# Patient Record
Sex: Male | Born: 1970 | Race: Black or African American | Hispanic: No | Marital: Married | Smoking: Former smoker
Health system: Southern US, Community
[De-identification: ages and names within clinical notes are randomized; demographics above are authoritative.]

## PROBLEM LIST (undated history)

## (undated) DIAGNOSIS — E119 Type 2 diabetes mellitus without complications: Secondary | ICD-10-CM

## (undated) DIAGNOSIS — T7840XA Allergy, unspecified, initial encounter: Secondary | ICD-10-CM

## (undated) DIAGNOSIS — N189 Chronic kidney disease, unspecified: Secondary | ICD-10-CM

## (undated) DIAGNOSIS — I499 Cardiac arrhythmia, unspecified: Secondary | ICD-10-CM

## (undated) DIAGNOSIS — I509 Heart failure, unspecified: Secondary | ICD-10-CM

## (undated) DIAGNOSIS — I10 Essential (primary) hypertension: Secondary | ICD-10-CM

## (undated) DIAGNOSIS — K859 Acute pancreatitis without necrosis or infection, unspecified: Secondary | ICD-10-CM

## (undated) DIAGNOSIS — E785 Hyperlipidemia, unspecified: Secondary | ICD-10-CM

## (undated) DIAGNOSIS — M1990 Unspecified osteoarthritis, unspecified site: Secondary | ICD-10-CM

## (undated) DIAGNOSIS — I4891 Unspecified atrial fibrillation: Secondary | ICD-10-CM

## (undated) DIAGNOSIS — F419 Anxiety disorder, unspecified: Secondary | ICD-10-CM

## (undated) DIAGNOSIS — G4730 Sleep apnea, unspecified: Secondary | ICD-10-CM

## (undated) HISTORY — DX: Type 2 diabetes mellitus without complications: E11.9

## (undated) HISTORY — DX: Unspecified osteoarthritis, unspecified site: M19.90

## (undated) HISTORY — DX: Acute pancreatitis without necrosis or infection, unspecified: K85.90

## (undated) HISTORY — DX: Hyperlipidemia, unspecified: E78.5

## (undated) HISTORY — DX: Cardiac arrhythmia, unspecified: I49.9

## (undated) HISTORY — DX: Allergy, unspecified, initial encounter: T78.40XA

## (undated) HISTORY — DX: Sleep apnea, unspecified: G47.30

## (undated) HISTORY — DX: Anxiety disorder, unspecified: F41.9

## (undated) HISTORY — DX: Heart failure, unspecified: I50.9

## (undated) HISTORY — DX: Chronic kidney disease, unspecified: N18.9

## (undated) HISTORY — PX: MECHANICAL AORTIC VALVE REPLACEMENT: SHX2013

---

## 2017-08-01 HISTORY — PX: ABLATION: SHX5711

## 2017-11-16 ENCOUNTER — Encounter: Payer: Self-pay | Admitting: Emergency Medicine

## 2017-11-16 ENCOUNTER — Inpatient Hospital Stay
Admission: EM | Admit: 2017-11-16 | Discharge: 2017-11-17 | DRG: 291 | Disposition: A | Discharge status: Home / Self Care | Payer: MEDICARE | Attending: Family Medicine | Admitting: Family Medicine

## 2017-11-16 ENCOUNTER — Emergency Department: Payer: MEDICARE

## 2017-11-16 ENCOUNTER — Other Ambulatory Visit: Payer: Self-pay

## 2017-11-16 ENCOUNTER — Inpatient Hospital Stay
Admit: 2017-11-16 | Discharge: 2017-11-16 | Disposition: A | Discharge status: Home / Self Care | Payer: MEDICARE | Attending: Internal Medicine | Admitting: Internal Medicine

## 2017-11-16 DIAGNOSIS — I509 Heart failure, unspecified: Secondary | ICD-10-CM

## 2017-11-16 DIAGNOSIS — R0602 Shortness of breath: Secondary | ICD-10-CM | POA: Diagnosis not present

## 2017-11-16 DIAGNOSIS — Z952 Presence of prosthetic heart valve: Secondary | ICD-10-CM

## 2017-11-16 DIAGNOSIS — Z6841 Body Mass Index (BMI) 40.0 and over, adult: Secondary | ICD-10-CM

## 2017-11-16 DIAGNOSIS — Z794 Long term (current) use of insulin: Secondary | ICD-10-CM

## 2017-11-16 DIAGNOSIS — I130 Hypertensive heart and chronic kidney disease with heart failure and stage 1 through stage 4 chronic kidney disease, or unspecified chronic kidney disease: Principal | ICD-10-CM | POA: Diagnosis present

## 2017-11-16 DIAGNOSIS — Z7901 Long term (current) use of anticoagulants: Secondary | ICD-10-CM

## 2017-11-16 DIAGNOSIS — J9610 Chronic respiratory failure, unspecified whether with hypoxia or hypercapnia: Secondary | ICD-10-CM

## 2017-11-16 DIAGNOSIS — I481 Persistent atrial fibrillation: Secondary | ICD-10-CM | POA: Diagnosis not present

## 2017-11-16 DIAGNOSIS — J9621 Acute and chronic respiratory failure with hypoxia: Secondary | ICD-10-CM | POA: Diagnosis not present

## 2017-11-16 DIAGNOSIS — E669 Obesity, unspecified: Secondary | ICD-10-CM | POA: Diagnosis not present

## 2017-11-16 DIAGNOSIS — E1122 Type 2 diabetes mellitus with diabetic chronic kidney disease: Secondary | ICD-10-CM | POA: Diagnosis present

## 2017-11-16 DIAGNOSIS — I5031 Acute diastolic (congestive) heart failure: Secondary | ICD-10-CM | POA: Diagnosis not present

## 2017-11-16 DIAGNOSIS — Z79899 Other long term (current) drug therapy: Secondary | ICD-10-CM

## 2017-11-16 DIAGNOSIS — I4891 Unspecified atrial fibrillation: Secondary | ICD-10-CM | POA: Diagnosis not present

## 2017-11-16 DIAGNOSIS — Z9981 Dependence on supplemental oxygen: Secondary | ICD-10-CM | POA: Diagnosis not present

## 2017-11-16 DIAGNOSIS — N189 Chronic kidney disease, unspecified: Secondary | ICD-10-CM | POA: Diagnosis not present

## 2017-11-16 DIAGNOSIS — E119 Type 2 diabetes mellitus without complications: Secondary | ICD-10-CM | POA: Diagnosis not present

## 2017-11-16 DIAGNOSIS — I5023 Acute on chronic systolic (congestive) heart failure: Secondary | ICD-10-CM | POA: Diagnosis present

## 2017-11-16 DIAGNOSIS — I10 Essential (primary) hypertension: Secondary | ICD-10-CM | POA: Diagnosis not present

## 2017-11-16 HISTORY — DX: Heart failure, unspecified: I50.9

## 2017-11-16 HISTORY — DX: Unspecified atrial fibrillation: I48.91

## 2017-11-16 HISTORY — DX: Essential (primary) hypertension: I10

## 2017-11-16 HISTORY — DX: Type 2 diabetes mellitus without complications: E11.9

## 2017-11-16 LAB — URINALYSIS, COMPLETE (UACMP) WITH MICROSCOPIC
BILIRUBIN URINE: NEGATIVE
Bacteria, UA: NONE SEEN
Glucose, UA: >=500 mg/dL — AB
Hgb urine dipstick: NEGATIVE
Ketones, ur: NEGATIVE mg/dL
Leukocytes, UA: NEGATIVE
NITRITE: NEGATIVE
PH: 5 (ref 5.0–8.0)
Protein, ur: 30 mg/dL — AB
SPECIFIC GRAVITY, URINE: 1.03 (ref 1.005–1.030)

## 2017-11-16 LAB — COMPREHENSIVE METABOLIC PANEL
ALT: 27 U/L (ref 17–63)
ANION GAP: 7 (ref 5–15)
AST: 26 U/L (ref 15–41)
Albumin: 3.7 g/dL (ref 3.5–5.0)
Alkaline Phosphatase: 85 U/L (ref 38–126)
BILIRUBIN TOTAL: 1 mg/dL (ref 0.3–1.2)
BUN: 30 mg/dL — AB (ref 6–20)
CO2: 32 mmol/L (ref 22–32)
Calcium: 8.6 mg/dL — ABNORMAL LOW (ref 8.9–10.3)
Chloride: 98 mmol/L — ABNORMAL LOW (ref 101–111)
Creatinine, Ser: 1.93 mg/dL — ABNORMAL HIGH (ref 0.61–1.24)
GFR, EST AFRICAN AMERICAN: 46 mL/min — AB (ref >60)
GFR, EST NON AFRICAN AMERICAN: 39 mL/min — AB (ref >60)
Glucose, Bld: 390 mg/dL — ABNORMAL HIGH (ref 65–99)
POTASSIUM: 5.1 mmol/L (ref 3.5–5.1)
Sodium: 137 mmol/L (ref 135–145)
TOTAL PROTEIN: 7.8 g/dL (ref 6.5–8.1)

## 2017-11-16 LAB — CBC WITH DIFFERENTIAL/PLATELET
Basophils Absolute: 0.1 10*3/uL (ref 0–0.1)
Basophils Relative: 1 %
EOS PCT: 2 %
Eosinophils Absolute: 0.1 10*3/uL (ref 0–0.7)
HEMATOCRIT: 49.2 % (ref 40.0–52.0)
Hemoglobin: 16 g/dL (ref 13.0–18.0)
LYMPHS ABS: 2.7 10*3/uL (ref 1.0–3.6)
LYMPHS PCT: 40 %
MCH: 27.9 pg (ref 26.0–34.0)
MCHC: 32.5 g/dL (ref 32.0–36.0)
MCV: 85.7 fL (ref 80.0–100.0)
MONO ABS: 1 10*3/uL (ref 0.2–1.0)
MONOS PCT: 14 %
NEUTROS ABS: 3 10*3/uL (ref 1.4–6.5)
Neutrophils Relative %: 43 %
Platelets: 197 10*3/uL (ref 150–440)
RBC: 5.74 MIL/uL (ref 4.40–5.90)
RDW: 18.3 % — ABNORMAL HIGH (ref 11.5–14.5)
WBC: 6.9 10*3/uL (ref 3.8–10.6)

## 2017-11-16 LAB — PROTIME-INR
INR: 2
Prothrombin Time: 22.5 seconds — ABNORMAL HIGH (ref 11.4–15.2)

## 2017-11-16 LAB — TROPONIN I
TROPONIN I: 0.03 ng/mL — AB (ref <0.03)
Troponin I: <0.03 ng/mL (ref <0.03)
Troponin I: <0.03 ng/mL (ref <0.03)

## 2017-11-16 LAB — MAGNESIUM: MAGNESIUM: 2.2 mg/dL (ref 1.7–2.4)

## 2017-11-16 LAB — GLUCOSE, CAPILLARY
Glucose-Capillary: 319 mg/dL — ABNORMAL HIGH (ref 65–99)
Glucose-Capillary: 346 mg/dL — ABNORMAL HIGH (ref 65–99)
Glucose-Capillary: 319 mg/dL — ABNORMAL HIGH (ref 65–99)

## 2017-11-16 LAB — BRAIN NATRIURETIC PEPTIDE: B Natriuretic Peptide: 117 pg/mL — ABNORMAL HIGH (ref 0.0–100.0)

## 2017-11-16 MED ORDER — LOSARTAN POTASSIUM 50 MG PO TABS
100.0000 mg | ORAL_TABLET | Freq: Two times a day (BID) | ORAL | Status: DC
  Administered 2017-11-16 – 2017-11-17 (×2): 100 mg via ORAL
  Filled 2017-11-16 (×2): qty 2

## 2017-11-16 MED ORDER — INSULIN DETEMIR 100 UNIT/ML ~~LOC~~ SOLN
50.0000 [IU] | Freq: Two times a day (BID) | SUBCUTANEOUS | Status: DC
  Administered 2017-11-16 – 2017-11-17 (×2): 50 [IU] via SUBCUTANEOUS
  Filled 2017-11-16 (×4): qty 0.5

## 2017-11-16 MED ORDER — INSULIN ASPART 100 UNIT/ML ~~LOC~~ SOLN
15.0000 [IU] | Freq: Three times a day (TID) | SUBCUTANEOUS | Status: DC
  Administered 2017-11-16 – 2017-11-17 (×3): 15 [IU] via SUBCUTANEOUS
  Filled 2017-11-16 (×3): qty 1

## 2017-11-16 MED ORDER — FUROSEMIDE 10 MG/ML IJ SOLN
40.0000 mg | Freq: Three times a day (TID) | INTRAMUSCULAR | Status: DC
  Administered 2017-11-16 – 2017-11-17 (×4): 40 mg via INTRAVENOUS
  Filled 2017-11-16 (×5): qty 4

## 2017-11-16 MED ORDER — INSULIN ASPART 100 UNIT/ML ~~LOC~~ SOLN
0.0000 [IU] | Freq: Three times a day (TID) | SUBCUTANEOUS | Status: DC
  Administered 2017-11-16: 7 [IU] via SUBCUTANEOUS
  Administered 2017-11-17: 5 [IU] via SUBCUTANEOUS
  Administered 2017-11-17: 9 [IU] via SUBCUTANEOUS
  Filled 2017-11-16 (×3): qty 1

## 2017-11-16 MED ORDER — DOCUSATE SODIUM 100 MG PO CAPS: 100.0000 mg | ORAL_CAPSULE | Freq: Two times a day (BID) | ORAL | Status: DC | PRN

## 2017-11-16 MED ORDER — GABAPENTIN 300 MG PO CAPS
900.0000 mg | ORAL_CAPSULE | Freq: Four times a day (QID) | ORAL | Status: DC
  Administered 2017-11-16: 900 mg via ORAL
  Filled 2017-11-16: qty 3

## 2017-11-16 MED ORDER — TIZANIDINE HCL 4 MG PO TABS
4.0000 mg | ORAL_TABLET | Freq: Three times a day (TID) | ORAL | Status: DC | PRN
  Administered 2017-11-17: 4 mg via ORAL
  Filled 2017-11-16 (×2): qty 1

## 2017-11-16 MED ORDER — WARFARIN SODIUM 5 MG PO TABS: 5.0000 mg | ORAL_TABLET | Freq: Every day | ORAL | Status: DC

## 2017-11-16 MED ORDER — WARFARIN - PHARMACIST DOSING INPATIENT
Freq: Every day | Status: DC
  Administered 2017-11-16: 17:00:00
  Filled 2017-11-16: qty 1

## 2017-11-16 MED ORDER — WARFARIN SODIUM 5 MG PO TABS
5.0000 mg | ORAL_TABLET | Freq: Every day | ORAL | Status: DC
  Administered 2017-11-16: 5 mg via ORAL
  Filled 2017-11-16: qty 1

## 2017-11-16 MED ORDER — PREGABALIN 50 MG PO CAPS
100.0000 mg | ORAL_CAPSULE | Freq: Three times a day (TID) | ORAL | Status: DC
  Administered 2017-11-16: 100 mg via ORAL
  Filled 2017-11-16 (×2): qty 2

## 2017-11-16 MED ORDER — ALPRAZOLAM 0.5 MG PO TABS
0.5000 mg | ORAL_TABLET | Freq: Two times a day (BID) | ORAL | Status: DC
  Administered 2017-11-16 – 2017-11-17 (×2): 0.5 mg via ORAL
  Filled 2017-11-16 (×2): qty 1

## 2017-11-16 MED ORDER — INSULIN ASPART 100 UNIT/ML ~~LOC~~ SOLN
0.0000 [IU] | Freq: Three times a day (TID) | SUBCUTANEOUS | Status: DC
  Administered 2017-11-16: 7 [IU] via SUBCUTANEOUS
  Filled 2017-11-16: qty 1

## 2017-11-16 MED ORDER — CARVEDILOL 25 MG PO TABS
25.0000 mg | ORAL_TABLET | Freq: Two times a day (BID) | ORAL | Status: DC
  Administered 2017-11-16 – 2017-11-17 (×2): 25 mg via ORAL
  Filled 2017-11-16 (×2): qty 1

## 2017-11-16 MED ORDER — FUROSEMIDE 10 MG/ML IJ SOLN
60.0000 mg | Freq: Once | INTRAMUSCULAR | Status: AC
  Administered 2017-11-16: 60 mg via INTRAVENOUS
  Filled 2017-11-16: qty 8

## 2017-11-16 NOTE — ED Notes (Signed)
FN: pt ambulatory to stat desk with reports of increased shortness of breath, going on for a while.

## 2017-11-16 NOTE — ED Notes (Signed)
Pt placed on 2 lpm o2 at triage for room air sats of 90%.  Family with pt at triage.

## 2017-11-16 NOTE — Plan of Care (Signed)
  Progressing Education: Knowledge of General Education information will improve 11/16/2017 2301 - Progressing by Dorna LeitzNesbitt, Logan Baltimore M, RN Safety: Ability to remain free from injury will improve 11/16/2017 2301 - Progressing by Dorna LeitzNesbitt, Edson Deridder M, RN Education: Ability to verbalize understanding of medication therapies will improve 11/16/2017 2301 - Progressing by Dorna LeitzNesbitt, Kieren Ricci M, RN Activity: Capacity to carry out activities will improve 11/16/2017 2301 - Progressing by Dorna LeitzNesbitt, Lilyahna Sirmon M, RN

## 2017-11-16 NOTE — Progress Notes (Signed)
Family Meeting Note  Advance Directive:yes  Today a meeting took place with the Patient and spouse.  The following clinical team members were present during this meeting:MD  The following were discussed:Patient's diagnosis: CHF, DM, valve replacement , Patient's progosis: Unable to determine and Goals for treatment: Full Code  Additional follow-up to be provided: Cardio consult.  Time spent during discussion:20 minutes  Altamese DillingVaibhavkumar Sharnee Douglass, MD

## 2017-11-16 NOTE — H&P (Signed)
[facility] [place] [facility]   PATIENT NAME: Alexander Alexander Narain    MR#:  [id number]  DATE OF BIRTH:  [birth year]  DATE OF ADMISSION:  11/16/2017  PRIMARY CARE PHYSICIAN: Patient, No Pcp Per   REQUESTING/REFERRING PHYSICIAN: Roxan Hockeyobinson  CHIEF COMPLAINT:   Chief Complaint  Patient presents with  . Abdominal Pain  . Edema    HISTORY OF PRESENT ILLNESS: Alexander Alexander Morioka  is a 47 y.o. male with a known history of CHF, Valve replacement, Htn, DM, obese- Moved from [place] 2 mth ago. Gaining weight and have worsenign abd and leg swelling. Progressive SOb with minimal exertion, so came to ER. Not following dietary restrictions and drinks a lot of liquids. Noted in CHF and have hypoxia. Have Home o2 as needed, but need to keep on all the time for a few days.  PAST MEDICAL HISTORY:   Past Medical History:  Diagnosis Date  . Atrial fibrillation (HCC)   . CHF (congestive heart failure) (HCC)   . Diabetes mellitus without complication (HCC)   . Hypertension   . Renal disorder     PAST SURGICAL HISTORY:  Past Surgical History:  Procedure Laterality Date  . CARDIAC VALVE REPLACEMENT    . VALVE REPLACEMENT      SOCIAL HISTORY:  Social History   Tobacco Use  . Smoking status: Never Smoker  . Smokeless tobacco: Never Used  Substance Use Topics  . Alcohol use: No    Frequency: Never    FAMILY HISTORY:  Family History  Problem Relation Age of Onset  . CAD Mother     DRUG ALLERGIES: No Known Allergies  REVIEW OF SYSTEMS:   CONSTITUTIONAL: No fever, fatigue or weakness.  EYES: No blurred or double vision.  EARS, NOSE, AND THROAT: No tinnitus or ear pain.  RESPIRATORY: No cough,have shortness of breath,no wheezing or hemoptysis.  CARDIOVASCULAR: No chest pain, orthopnea, edema.  GASTROINTESTINAL: No nausea, vomiting, diarrhea or abdominal pain.  GENITOURINARY: No dysuria, hematuria.  ENDOCRINE: No polyuria, nocturia,  HEMATOLOGY: No anemia, easy bruising or  bleeding SKIN: No rash or lesion. MUSCULOSKELETAL: No joint pain or arthritis.   NEUROLOGIC: No tingling, numbness, weakness.  PSYCHIATRY: No anxiety or depression.   MEDICATIONS AT HOME:  Prior to Admission medications   Medication Sig Start Date End Date Taking? Authorizing Provider  ALPRAZolam Prudy Feeler(XANAX) 0.5 MG tablet Take 1 tablet by mouth 2 (two) times daily. 10/05/17  Yes [provider]  carvedilol (COREG) 25 MG tablet Take 50 mg by mouth 2 (two) times daily. 10/21/17  Yes [provider]  furosemide (LASIX) 40 MG tablet Take 40 mg by mouth daily. 10/30/17  Yes [provider]  gabapentin (NEURONTIN) 300 MG capsule Take 900 mg by mouth 4 (four) times daily.   Yes [provider]  glimepiride (AMARYL) 2 MG tablet Take 2 mg by mouth daily with breakfast.   Yes [provider]  insulin detemir (LEVEMIR) 100 UNIT/ML injection Inject 50 Units into the skin 2 (two) times daily.   Yes [provider]  losartan (COZAAR) 100 MG tablet Take 100 mg by mouth 2 (two) times daily.   Yes [provider]  NOVOLOG 100 UNIT/ML injection Inject 20-25 Units into the skin 3 (three) times daily.  10/30/17  Yes [provider]  tiZANidine (ZANAFLEX) 4 MG tablet Take 4 mg by mouth every 8 (eight) hours as needed for muscle spasms.   Yes [provider]  warfarin (COUMADIN) 5 MG tablet Take  5 mg by mouth daily. 10/04/17  Yes [provider]      PHYSICAL EXAMINATION:   VITAL SIGNS: Blood pressure (!) 134/93, pulse (!) 50, temperature 98.3 F (36.8 C), temperature source Oral, resp. rate 16, height 5\' 10"  (1.778 m), weight (!) 172.3 kg (379 lb 14.4 oz), SpO2 94 %.  GENERAL:  47 y.o.-year-old obese patient lying in the bed with no acute distress.  EYES: Pupils equal, round, reactive to light and accommodation. No scleral icterus. Extraocular muscles intact.  HEENT: Head atraumatic, normocephalic. Oropharynx and  nasopharynx clear.  NECK:  Supple, no jugular venous distention. No thyroid enlargement, no tenderness.  LUNGS: Normal breath sounds bilaterally, no wheezing, b/l crepitation. No use of accessory muscles of respiration.  CARDIOVASCULAR: S1, S2 normal. No murmurs, rubs, or gallops.  ABDOMEN: Soft, nontender, nondistended. Bowel sounds present. No organomegaly or mass.  EXTREMITIES: positive for pedal edema, no cyanosis, or clubbing.  NEUROLOGIC: Cranial nerves II through XII are intact. Muscle strength 5/5 in all extremities. Sensation intact. Gait not checked.  PSYCHIATRIC: The patient is alert and oriented x 3.  SKIN: No obvious rash, lesion, or ulcer.   LABORATORY PANEL:   CBC Recent Labs  Lab 11/16/17 1057  WBC 6.9  HGB 16.0  HCT 49.2  PLT 197  MCV 85.7  MCH 27.9  MCHC 32.5  RDW 18.3*  LYMPHSABS 2.7  MONOABS 1.0  EOSABS 0.1  BASOSABS 0.1   ------------------------------------------------------------------------------------------------------------------  Chemistries  Recent Labs  Lab 11/16/17 1057  NA 137  K 5.1  CL 98*  CO2 32  GLUCOSE 390*  BUN 30*  CREATININE 1.93*  CALCIUM 8.6*  MG 2.2  AST 26  ALT 27  ALKPHOS 85  BILITOT 1.0   ------------------------------------------------------------------------------------------------------------------ estimated creatinine clearance is 74.6 mL/min (A) (by C-G formula based on SCr of 1.93 mg/dL (H)). ------------------------------------------------------------------------------------------------------------------ No results for input(s): TSH, T4TOTAL, T3FREE, THYROIDAB in the last 72 hours.  Invalid input(s): FREET3   Coagulation profile Recent Labs  Lab 11/16/17 1117  INR 2.00   ------------------------------------------------------------------------------------------------------------------- No results for input(s): DDIMER in the last 72  hours. -------------------------------------------------------------------------------------------------------------------  Cardiac Enzymes Recent Labs  Lab 11/16/17 1057  TROPONINI <0.03   ------------------------------------------------------------------------------------------------------------------ Invalid input(s): POCBNP  ---------------------------------------------------------------------------------------------------------------  Urinalysis    Component Value Date/Time   COLORURINE YELLOW (A) 11/16/2017 1057   APPEARANCEUR CLEAR (A) 11/16/2017 1057   LABSPEC 1.030 11/16/2017 1057   PHURINE 5.0 11/16/2017 1057   GLUCOSEU >=500 (A) 11/16/2017 1057   HGBUR NEGATIVE 11/16/2017 1057   BILIRUBINUR NEGATIVE 11/16/2017 1057   KETONESUR NEGATIVE 11/16/2017 1057   PROTEINUR 30 (A) 11/16/2017 1057   NITRITE NEGATIVE 11/16/2017 1057   LEUKOCYTESUR NEGATIVE 11/16/2017 1057     RADIOLOGY: Dg Chest 2 View  Result Date: 11/16/2017 CLINICAL DATA:  Shortness of breath with soft tissue edema EXAM: CHEST  2 VIEW COMPARISON:  None. FINDINGS: There is slight atelectasis in the left base. There is no frank edema or consolidation. There is cardiomegaly with mild pulmonary venous hypertension. Patient is status post aortic valve replacement. No evident adenopathy. No bone lesions. IMPRESSION: Pulmonary vascular congestion without frank edema or consolidation. Mild left base atelectasis. Status post aortic valve replacement. Electronically Signed   By: Bretta Bang III M.D.   On: 11/16/2017 12:27    EKG: Orders placed or performed during the hospital encounter of 11/16/17  . EKG 12-Lead  . EKG 12-Lead  . EKG 12-Lead  . EKG 12-Lead    IMPRESSION AND PLAN:  *  Ac on ch CHF   No Echo, no EF records.   IV lasix, Monitor I /o   Fluid restriction, Daily weight   Monitor tele, troponin   Get Echo, cardio consult   Counselled about diet and fluid restrictions  * Ac on ch respi  failure   Cont OXygen  * DM   Cotn basal and with meal insulin, keep on ISS   Hold glipizide  * Htn    Cotn coreg and losartan  * Valve replacement, metalic valve   Cont Coumadin, pharmacy to manage dose.   All the records are reviewed and case discussed with ED provider. Management plans discussed with the patient, family and they are in agreement.  CODE STATUS: Full. Code Status History    This patient does not have a recorded code status. Please follow your organizational policy for patients in this situation.     Wife in room.  TOTAL TIME TAKING CARE OF THIS PATIENT: 50 minutes.    Altamese Dilling M.D on 11/16/2017   Between 7am to 6pm - Pager - [phone]  After 6pm go to [email or web address] - password [email or web address]  [facility]  Office  [phone]  CC: Primary care physician; Patient, No Pcp Per   Note: This dictation was prepared with Dragon dictation along with smaller phrase technology. Any transcriptional errors that result from this process are unintentional.

## 2017-11-16 NOTE — ED Notes (Signed)
PT able to ambulate around the room for 5 minutes with 2L oxygen prior to admitting increased SOB. Pts oxygen saturation dropped to 89%. PT verbalized to RN that he does not have portable oxygen at home. MD verbalized to have pt take oxygen off to see how pt would do at home upon exertion. Oxygen dropped to 84% and pt was not able to tolerate walking for longer than a minute. PT back in bed resting with 2L oxygen and is back to an oxygen saturation of 95%.

## 2017-11-16 NOTE — ED Notes (Signed)
RN called lab to update on add on blood work.

## 2017-11-16 NOTE — ED Provider Notes (Signed)
[facility] Emergency Department Provider Note    First MD Initiated Contact with Patient 11/16/17 1014     (approximate)  I have reviewed the triage vital signs and the nursing notes.   HISTORY  Chief Complaint Abdominal Pain and Edema    HPI Alexander Alexander is a 47 y.o. male with a history of A. fib CHF diabetes and CKD not on dialysis with no recent hospitalizations per the ER for worsening edema abdominal swelling and shortness of breath particularly with any exertion.  Patient has been self titrating oxygen at home.  Is typically only is was to wear it on at night but is been wearing it throughout the day due to worsening shortness of breath.  States he takes 40 mg of Lasix daily and has been compliant with this.  Denies any chest pain.  No nausea or vomiting.  Has had a normal appetite.  Past Medical History:  Diagnosis Date  . Atrial fibrillation (HCC)   . CHF (congestive heart failure) (HCC)   . Diabetes mellitus without complication (HCC)   . Hypertension   . Renal disorder    History reviewed. No pertinent family history. Past Surgical History:  Procedure Laterality Date  . CARDIAC VALVE REPLACEMENT     There are no active problems to display for this patient.     Prior to Admission medications   Medication Sig Start Date End Date Taking? Authorizing Provider  ALPRAZolam Prudy Feeler(XANAX) 0.5 MG tablet Take 1 tablet by mouth 2 (two) times daily. 10/05/17  Yes [provider]  carvedilol (COREG) 25 MG tablet Take 50 mg by mouth 2 (two) times daily. 10/21/17  Yes [provider]  furosemide (LASIX) 40 MG tablet Take 40 mg by mouth daily. 10/30/17  Yes [provider]  gabapentin (NEURONTIN) 300 MG capsule Take 900 mg by mouth 4 (four) times daily.   Yes [provider]  glimepiride (AMARYL) 2 MG tablet Take 2 mg by mouth daily with breakfast.   Yes [provider]  insulin detemir (LEVEMIR) 100 UNIT/ML  injection Inject 50 Units into the skin 2 (two) times daily.   Yes [provider]  losartan (COZAAR) 100 MG tablet Take 100 mg by mouth 2 (two) times daily.   Yes [provider]  NOVOLOG 100 UNIT/ML injection Inject 20-25 Units into the skin 3 (three) times daily.  10/30/17  Yes [provider]  tiZANidine (ZANAFLEX) 4 MG tablet Take 4 mg by mouth every 8 (eight) hours as needed for muscle spasms.   Yes [provider]  warfarin (COUMADIN) 5 MG tablet Take 5 mg by mouth daily. 10/04/17  Yes [provider]    Allergies Patient has no known allergies.    Social History Social History   Tobacco Use  . Smoking status: Never Smoker  . Smokeless tobacco: Never Used  Substance Use Topics  . Alcohol use: No    Frequency: Never  . Drug use: No    Review of Systems Patient denies headaches, rhinorrhea, blurry vision, numbness, shortness of breath, chest pain, edema, cough, abdominal pain, nausea, vomiting, diarrhea, dysuria, fevers, rashes or hallucinations unless otherwise stated above in HPI. ____________________________________________   PHYSICAL EXAM:  VITAL SIGNS: Vitals:   11/16/17 1303 11/16/17 1304  BP:  (!) 132/103  Pulse: (!) 117 (!) 114  Resp: (!) 25 (!) 24  Temp:    SpO2: (!) 86% 95%    Constitutional: Alert and oriented and in no acute distress.  Eyes: Conjunctivae are normal.  Head: Atraumatic. Nose: No congestion/rhinnorhea. Mouth/Throat: Mucous membranes are moist.   Neck: No stridor. Painless ROM.  Cardiovascular: Mildly tachycardic irregularly irregular rhythm. grossly normal heart sounds.  Good peripheral circulation. Respiratory: Normal respiratory effort.  No retractions. Lungs CTAB. Gastrointestinal: Soft and nontender.  Morbidly obese.  No fluid wave no abdominal bruits. No CVA tenderness. Musculoskeletal: No lower extremity tenderness, 2++ BLE edema.  No joint effusions. Neurologic:  Normal speech and  language. No gross focal neurologic deficits are appreciated. No facial droop Skin:  Skin is warm, dry and intact. No rash noted. Psychiatric: Mood and affect are normal. Speech and behavior are normal.  ____________________________________________   LABS (all labs ordered are listed, but only abnormal results are displayed)  Results for orders placed or performed during the hospital encounter of 11/16/17 (from the past 24 hour(s))  CBC with Differential/Platelet     Status: Abnormal   Collection Time: 11/16/17 10:57 AM  Result Value Ref Range   WBC 6.9 3.8 - 10.6 K/uL   RBC 5.74 4.40 - 5.90 MIL/uL   Hemoglobin 16.0 13.0 - 18.0 g/dL   HCT 96.0 45.4 - 09.8 %   MCV 85.7 80.0 - 100.0 fL   MCH 27.9 26.0 - 34.0 pg   MCHC 32.5 32.0 - 36.0 g/dL   RDW 11.9 (H) 14.7 - 82.9 %   Platelets 197 150 - 440 K/uL   Neutrophils Relative % 43 %   Neutro Abs 3.0 1.4 - 6.5 K/uL   Lymphocytes Relative 40 %   Lymphs Abs 2.7 1.0 - 3.6 K/uL   Monocytes Relative 14 %   Monocytes Absolute 1.0 0.2 - 1.0 K/uL   Eosinophils Relative 2 %   Eosinophils Absolute 0.1 0 - 0.7 K/uL   Basophils Relative 1 %   Basophils Absolute 0.1 0 - 0.1 K/uL  Comprehensive metabolic panel     Status: Abnormal   Collection Time: 11/16/17 10:57 AM  Result Value Ref Range   Sodium 137 135 - 145 mmol/L   Potassium 5.1 3.5 - 5.1 mmol/L   Chloride 98 (L) 101 - 111 mmol/L   CO2 32 22 - 32 mmol/L   Glucose, Bld 390 (H) 65 - 99 mg/dL   BUN 30 (H) 6 - 20 mg/dL   Creatinine, Ser 5.62 (H) 0.61 - 1.24 mg/dL   Calcium 8.6 (L) 8.9 - 10.3 mg/dL   Total Protein 7.8 6.5 - 8.1 g/dL   Albumin 3.7 3.5 - 5.0 g/dL   AST 26 15 - 41 U/L   ALT 27 17 - 63 U/L   Alkaline Phosphatase 85 38 - 126 U/L   Total Bilirubin 1.0 0.3 - 1.2 mg/dL   GFR calc non Af Amer 39 (L) >60 mL/min   GFR calc Af Amer 46 (L) >60 mL/min   Anion gap 7 5 - 15  Troponin I     Status: None   Collection Time: 11/16/17 10:57 AM  Result Value Ref Range   Troponin I <0.03  <0.03 ng/mL  Magnesium     Status: None   Collection Time: 11/16/17 10:57 AM  Result Value Ref Range   Magnesium 2.2 1.7 - 2.4 mg/dL  Urinalysis, Complete w Microscopic     Status: Abnormal   Collection Time: 11/16/17 10:57 AM  Result Value Ref Range   Color, Urine YELLOW (A) YELLOW   APPearance CLEAR (A) CLEAR   Specific Gravity, Urine 1.030 1.005 - 1.030   pH 5.0 5.0 -  8.0   Glucose, UA >=500 (A) NEGATIVE mg/dL   Hgb urine dipstick NEGATIVE NEGATIVE   Bilirubin Urine NEGATIVE NEGATIVE   Ketones, ur NEGATIVE NEGATIVE mg/dL   Protein, ur 30 (A) NEGATIVE mg/dL   Nitrite NEGATIVE NEGATIVE   Leukocytes, UA NEGATIVE NEGATIVE   RBC / HPF 0-5 0 - 5 RBC/hpf   WBC, UA 6-30 0 - 5 WBC/hpf   Bacteria, UA NONE SEEN NONE SEEN   Squamous Epithelial / LPF 0-5 (A) NONE SEEN  Brain natriuretic peptide     Status: Abnormal   Collection Time: 11/16/17 10:57 AM  Result Value Ref Range   B Natriuretic Peptide 117.0 (H) 0.0 - 100.0 pg/mL  Protime-INR     Status: Abnormal   Collection Time: 11/16/17 11:17 AM  Result Value Ref Range   Prothrombin Time 22.5 (H) 11.4 - 15.2 seconds   INR 2.00    ____________________________________________  EKG My review and personal interpretation at Time: 10:08   Indication: htn  Rate: 105  Rhythm: afib Axis: normal Other: poor r wave progression, no stemi ____________________________________________  RADIOLOGY  I personally reviewed all radiographic images ordered to evaluate for the above acute complaints and reviewed radiology reports and findings.  These findings were personally discussed with the patient.  Please see medical record for radiology report.   EMERGENCY DEPARTMENT [place] ACITES EXAM "Study: Limited Abdominal Ultrasound for Evaluation of Free Fluid"  INDICATIONS: Distention  PERFORMED BY: Myself IMAGES ARCHIVED?: Yes VIEWS USES: Right upper quad and Right lower quad INTERPRETATION: Free fluid not  present     ____________________________________________   PROCEDURES  Procedure(s) performed:  Procedures  Due to difficulty with obtaining IV access, a 20G peripheral IV catheter was inserted using US guidance into the right forearm.  The site was prepped with chlorhexidine and allowed to dry.  The patient tolerated the procedure without any complications.     Critical Care performed: no ____________________________________________   INITIAL IMPRESSION / ASSESSMENT AND PLAN / ED COURSE  Pertinent labs & imaging results that were available during my care of the patient were reviewed by me and considered in my medical decision making (see chart for details).  DDX: CHF, CKD, anasarca, ascites, anemia, pneumonia, COPD  Alexander Alexander is a 47 y.o. who presents to the ED with worsening edema, weight gain and shortness of breath as described above.  Blood work was sent for the above differential.  Bedside ultrasound shows no evidence of ascites.  EKG shows A. fib with rate control.  Nonspecific ST changes.  Blood work shows evidence of chronic kidney disease but no acidosis.  Troponin is elevated to 0.05.  This is on uncertain significance in this clinical presentation given his underlying CHF.   Will give IV lasix. The patient will be placed on continuous pulse oximetry and telemetry for monitoring.  Laboratory evaluation will be sent to evaluate for the above complaints.     Clinical Course as of Nov 16 1406  Wed Nov 16, 2017  1303 Patient found to be significantly hypoxic with even short ambulation.  Patient does not wear home oxygen does not have access to home oxygen.  Due to pulmonary vascular congestion and concern for worsening congestive heart failure with significant weight weight gain I do feel patient will require hospitalization for further respiratory management, supplemental oxygen due to his acute on chronic respiratory failure with hypoxia and additional IV diuresis.  [PR]     Clinical Course User Index [PR] Willy Eddy, MD  ____________________________________________   FINAL CLINICAL IMPRESSION(S) / ED DIAGNOSES  Final diagnoses:  Acute on chronic respiratory failure with hypoxia (HCC)  Acute on chronic congestive heart failure, unspecified heart failure type (HCC)  Chronic kidney disease, unspecified CKD stage      NEW MEDICATIONS STARTED DURING THIS VISIT:  This SmartLink is deprecated. Use AVSMEDLIST instead to display the medication list for a patient.   Note:  This document was prepared using Dragon voice recognition software and may include unintentional dictation errors.    Willy Eddy, MD 11/16/17 [phone]

## 2017-11-16 NOTE — ED Triage Notes (Addendum)
Pt to ed with c/o increase in abd swelling and fluid retention. Pt states hx of same.  Denies fluid increase in lower extremities, pt states appt at [facility] with electrophysiology on 1/14.  Pt reports he has been using lasix at home for same without relief. Pt also c/o sob, worse with laying flat and worse with any exertion.  Pt also reports recent use of o2 at home on a daily basis.

## 2017-11-16 NOTE — Progress Notes (Addendum)
ANTICOAGULATION CONSULT NOTE - Initial Consult  Pharmacy Consult for warfarin  Indication: atrial fibrillation/mechanical valve   No Known Allergies  Patient Measurements: Height: 5\' 10"  (177.8 cm) Weight: (!) 379 lb 14.4 oz (172.3 kg) IBW/kg (Calculated) : 73 Heparin Dosing Weight:   Vital Signs: Temp: 98.3 F (36.8 C) (01/09 1003) Temp Source: Oral (01/09 1003) BP: 149/82 (01/09 1530) Pulse Rate: 45 (01/09 1530)  Labs: Recent Labs    11/16/17 1057 11/16/17 1117  HGB 16.0  --   HCT 49.2  --   PLT 197  --   LABPROT  --  22.5*  INR  --  2.00  CREATININE 1.93*  --   TROPONINI <0.03  --     Estimated Creatinine Clearance: 74.6 mL/min (A) (by C-G formula based on SCr of 1.93 mg/dL (H)).   Medical History: Past Medical History:  Diagnosis Date  . Atrial fibrillation (HCC)   . CHF (congestive heart failure) (HCC)   . Diabetes mellitus without complication (HCC)   . Hypertension   . Renal disorder     Medications:   (Not in a hospital admission) Scheduled:  . furosemide  40 mg Intravenous TID  . insulin aspart  0-9 Units Subcutaneous TID WC  . warfarin  5 mg Oral q1800  . Warfarin - Pharmacist Dosing Inpatient   Does not apply q1800    Assessment: Pharmacy consulted to dose and monitor warfarin therapy in this 47 year old woman with history of atrial fibrillation and mechanical valve.  Home dose of warfarin: 5 mg  Potential DDI (significant): Zosyn and Vancomycin   Goal of Therapy:  INR 2-3 Monitor platelets by anticoagulation protocol: Yes   Plan:  Will continue warfarin 5 mg PO daily. INR with am labs.   Wren Gallaga D 11/16/2017,4:13 PM

## 2017-11-17 DIAGNOSIS — I10 Essential (primary) hypertension: Secondary | ICD-10-CM | POA: Diagnosis not present

## 2017-11-17 DIAGNOSIS — I5031 Acute diastolic (congestive) heart failure: Secondary | ICD-10-CM | POA: Diagnosis not present

## 2017-11-17 DIAGNOSIS — E119 Type 2 diabetes mellitus without complications: Secondary | ICD-10-CM | POA: Diagnosis not present

## 2017-11-17 DIAGNOSIS — I4891 Unspecified atrial fibrillation: Secondary | ICD-10-CM | POA: Diagnosis not present

## 2017-11-17 DIAGNOSIS — J9621 Acute and chronic respiratory failure with hypoxia: Secondary | ICD-10-CM | POA: Diagnosis not present

## 2017-11-17 DIAGNOSIS — I509 Heart failure, unspecified: Secondary | ICD-10-CM | POA: Diagnosis not present

## 2017-11-17 LAB — PROTIME-INR
INR: 2.01
PROTHROMBIN TIME: 22.6 s — AB (ref 11.4–15.2)

## 2017-11-17 LAB — BASIC METABOLIC PANEL
ANION GAP: 11 (ref 5–15)
BUN: 31 mg/dL — ABNORMAL HIGH (ref 6–20)
CHLORIDE: 96 mmol/L — AB (ref 101–111)
CO2: 34 mmol/L — ABNORMAL HIGH (ref 22–32)
Calcium: 9.1 mg/dL (ref 8.9–10.3)
Creatinine, Ser: 1.82 mg/dL — ABNORMAL HIGH (ref 0.61–1.24)
GFR calc Af Amer: 49 mL/min — ABNORMAL LOW (ref >60)
GFR, EST NON AFRICAN AMERICAN: 42 mL/min — AB (ref >60)
Glucose, Bld: 262 mg/dL — ABNORMAL HIGH (ref 65–99)
POTASSIUM: 4.3 mmol/L (ref 3.5–5.1)
SODIUM: 141 mmol/L (ref 135–145)

## 2017-11-17 LAB — CBC
HCT: 52.2 % — ABNORMAL HIGH (ref 40.0–52.0)
Hemoglobin: 16.9 g/dL (ref 13.0–18.0)
MCH: 27.8 pg (ref 26.0–34.0)
MCHC: 32.5 g/dL (ref 32.0–36.0)
MCV: 85.5 fL (ref 80.0–100.0)
PLATELETS: 213 10*3/uL (ref 150–440)
RBC: 6.1 MIL/uL — ABNORMAL HIGH (ref 4.40–5.90)
RDW: 18.1 % — AB (ref 11.5–14.5)
WBC: 6.3 10*3/uL (ref 3.8–10.6)

## 2017-11-17 LAB — GLUCOSE, CAPILLARY
GLUCOSE-CAPILLARY: 368 mg/dL — AB (ref 65–99)
Glucose-Capillary: 273 mg/dL — ABNORMAL HIGH (ref 65–99)

## 2017-11-17 LAB — ECHOCARDIOGRAM COMPLETE
HEIGHTINCHES: 70 in
Weight: 6078.4 oz

## 2017-11-17 MED ORDER — WARFARIN SODIUM 7.5 MG PO TABS
7.5000 mg | ORAL_TABLET | Freq: Once | ORAL | Status: DC
  Filled 2017-11-17: qty 1

## 2017-11-17 MED ORDER — METOPROLOL TARTRATE 25 MG PO TABS
25.0000 mg | ORAL_TABLET | Freq: Two times a day (BID) | ORAL | Status: DC
  Administered 2017-11-17: 25 mg via ORAL
  Filled 2017-11-17: qty 1

## 2017-11-17 MED ORDER — FUROSEMIDE 40 MG PO TABS: 40.0000 mg | ORAL_TABLET | Freq: Two times a day (BID) | ORAL | 0 refills | Status: DC

## 2017-11-17 NOTE — Progress Notes (Signed)
Discharge instructions explained to pt and pts spouse/ verbalized an understanding/ iv and tele removed/ 02 delivered to room / transported off unit via wheelchair.

## 2017-11-17 NOTE — Care Management Important Message (Signed)
Important Message  Patient Details  Name: Delia HeadyOtis Vanderzanden MRN: [id number] Date of Birth: 1971-01-11   Medicare Important Message Given:  Yes    Eber HongGreene, Ethyn Schetter R, RN 11/17/2017, 3:37 PM

## 2017-11-17 NOTE — Progress Notes (Addendum)
Patient is a 47 year old male with PMH of CHF, DM, CKD, persistent afib, mechanical aortic valve replacement and repair of aortic aneurysm 2015.      CHF Education:   Educational session completed with patient.   ? Provided patient with "Living Better with Heart Failure" packet. Briefly reviewed definition of heart failure and signs and symptoms of an exacerbation.  Explained to patient that HF is a chronic illness which requires self-assessment / self-management along with help from the cardiologist/PCP/HF Clinic.???  *Reviewed importance of and reason behind checking weight daily in the AM, after using the bathroom, but before getting dressed.?Patient has scales.  Patient informed this RN that he has NOT been weighing himself every morning.?Encouraged patient to startweighing himself again and explained the importance of doing so along with assessing his symptoms.?? ? Reviewed the following information with patient:  *Discussed when to call the Dr= weight gain of >2-3lb overnight of 5lb in a week,  *Discussed yellow zone= call MD: weight gain of >2-3lb overnight of 5lb in a week, increased swelling, increased SOB when lying down, chest discomfort, dizziness, increased fatigue *Red Zone= call 911: struggle to breath, fainting or near fainting, significant chest pain  ? *Reviewed low sodium diet-provided handout of recommended and not recommended foods.  Reviewed reading labels with patient. Discussed fluid intake with patient as well. Patient not currently on a fluid restriction, but advised no more than 8-8 ounces glass of fluids per day.?  *Instructed patient to take medications as prescribed for heart failure. Explained briefly why pt is on the medications (either make you feel better, live longer or keep you out of the hospital) and discussed monitoring and side effects.   *Smoking Cessation - Patient is a never smoker.  ? ? *Discussed  the benefits of exercise.  Patient encouraged to remain  as active as possible.  Explained to patient that based on his EF per Medicare guidelines he is a candidate for Pulmonary Rehab.  Patient is very interested in participating in Pulmonary Rehab.  Patient stated that he would not be able to participate in Pulmonary Rehab until [facility].  He is being seen by the EP Specialist due to his persistent A-fib.  Patient instructed to check with the specialist to see how soon he/she recommends you starting in Pulmonary Rehab.  Patient agreeable to plan.    *[facility]- Expl[facility].  Explained to patient that the HF Clinic does not replace his PCP/cardiologist, but is an additional resource to help him manage his HF and to keep him out of the hospital.  Patient encouraged to keep this appointment. ?Appointment in the HF Clinic is scheduled for 11/25/2017  at 8:20 a.m. Brochure and directions provided.    Once again, the 5 Steps to Living Better with Heart Failure were reviewed with patient.  Patient thanked me for providing the above information. ?  Army Meliaiane Signe Tackitt, RN, BSN, [facility] Cardiovascular and Pulmonary Nurse Navigator

## 2017-11-17 NOTE — Care Management Note (Signed)
Case Management Note  Patient Details  Name: Alexander Alexander MRN: [id number] Date of Birth: [birth year]  Subjective/Objective:                 Admitted with acute on chronic CHF. Chronic home 02. Does not have portable tanks but is requesting some because he lives in a town house and wants to have some oxygen downstairs for use during the day if he needs it. Chronic coumadin. Previous valve replacement. [facility] to establish with PCP.  Denies issues obtaining medications or with transportation.  Has scales. Agreeable to appointment to the Heart Failure Clinic   Action/Plan:  Requested home oxygen assessment to determine if patient qualifies for continuous oxygen  Expected Discharge Date:  11/18/17               Expected Discharge Plan:     In-House Referral:     Discharge planning Services     Post Acute Care Choice:    Choice offered to:     DME Arranged:    DME Agency:     HH Arranged:    HH Agency:     Status of Service:     If discussed at [employer] Meetings, dates discussed:    Additional Comments:  Eber HongGreene, Avier Jech R, RN 11/17/2017, 8:43 AM

## 2017-11-17 NOTE — Progress Notes (Addendum)
ANTICOAGULATION CONSULT NOTE - Initial Consult  Pharmacy Consult for warfarin  Indication: atrial fibrillation/mechanical valve   No Known Allergies  Patient Measurements: Height: 5\' 10"  (177.8 cm) Weight: (!) 366 lb 9.6 oz (166.3 kg) IBW/kg (Calculated) : 73 Heparin Dosing Weight:   Vital Signs: Temp: 98 F (36.7 C) (01/10 0245) Temp Source: Oral (01/09 1912) BP: 119/95 (01/10 0245) Pulse Rate: 106 (01/10 0245)  Labs: Recent Labs    11/16/17 1057 11/16/17 1117 11/16/17 1612 11/16/17 1843 11/16/17 2024 11/17/17 0351  HGB 16.0  --   --   --   --  16.9  HCT 49.2  --   --   --   --  52.2*  PLT 197  --   --   --   --  213  LABPROT  --  22.5*  --   --   --  22.6*  INR  --  2.00  --   --   --  2.01  CREATININE 1.93*  --   --   --   --  1.82*  TROPONINI <0.03  --  0.03* <0.03 <0.03  --     Estimated Creatinine Clearance: 77.4 mL/min (A) (by C-G formula based on SCr of 1.82 mg/dL (H)).   Medical History: Past Medical History:  Diagnosis Date  . Atrial fibrillation (HCC)   . CHF (congestive heart failure) (HCC)   . Diabetes mellitus without complication (HCC)   . Hypertension   . Renal disorder     Medications:  Medications Prior to Admission  Medication Sig Dispense Refill Last Dose  . ALPRAZolam (XANAX) 0.5 MG tablet Take 1 tablet by mouth 2 (two) times daily.  1 PRN at PRN  . carvedilol (COREG) 25 MG tablet Take 50 mg by mouth 2 (two) times daily.  3 11/16/2017 at AM  . furosemide (LASIX) 40 MG tablet Take 40 mg by mouth daily.  1 11/16/2017 at AM  . gabapentin (NEURONTIN) 300 MG capsule Take 900 mg by mouth 4 (four) times daily.   11/16/2017 at AM  . glimepiride (AMARYL) 2 MG tablet Take 2 mg by mouth daily with breakfast.   11/16/2017 at AM  . insulin detemir (LEVEMIR) 100 UNIT/ML injection Inject 50 Units into the skin 2 (two) times daily.   11/16/2017 at AM  . losartan (COZAAR) 100 MG tablet Take 100 mg by mouth 2 (two) times daily.   11/16/2017 at AM  . NOVOLOG 100  UNIT/ML injection Inject 20-25 Units into the skin 3 (three) times daily.   2 11/16/2017 at AM  . tiZANidine (ZANAFLEX) 4 MG tablet Take 4 mg by mouth every 8 (eight) hours as needed for muscle spasms.   11/16/2017 at AM  . warfarin (COUMADIN) 5 MG tablet Take 5 mg by mouth daily.  2 11/16/2017 at AM   Scheduled:  . ALPRAZolam  0.5 mg Oral BID  . carvedilol  25 mg Oral BID  . furosemide  40 mg Intravenous TID  . insulin aspart  0-9 Units Subcutaneous TID AC & HS  . insulin aspart  15 Units Subcutaneous TID WC  . insulin detemir  50 Units Subcutaneous BID  . losartan  100 mg Oral BID  . pregabalin  100 mg Oral TID  . warfarin  5 mg Oral q1800  . Warfarin - Pharmacist Dosing Inpatient   Does not apply q1800    Assessment: Pharmacy consulted to dose and monitor warfarin therapy in this 47 year old woman with history of  atrial fibrillation and mechanical valve.  Home dose of warfarin: 5 mg  Potential DDI (significant): Zosyn and Vancomycin   Goal of Therapy:  INR 2.5 - 3.5  Monitor platelets by anticoagulation protocol: Yes   Plan:  Patient is subtherapeutic with INR 2.01, however home dose is warfarin 5mg  po daily. Will give one dose of warfarin 7.5mg  tonight. INR goal is 2.5-3.5. INR with am labs.   Hien Perreira 11/17/2017,7:10 AM

## 2017-11-17 NOTE — Progress Notes (Signed)
SATURATION QUALIFICATIONS: (This note is used to comply with regulatory documentation for home oxygen)  Patient Saturations on Room Air at Rest = 87%  Patient Saturations on Room Air while Ambulating =   Patient Saturations on 2 Liters of oxygen while Ambulating = 94%  Please briefly explain why patient needs home oxygen: 

## 2017-11-17 NOTE — Discharge Summary (Signed)
[facility] [place] [facility]   PATIENT NAME: Alexander Alexander    MR#:  [id number]  DATE OF BIRTH:  04-15-1971  DATE OF ADMISSION:  11/16/2017 ADMITTING PHYSICIAN: Altamese DillingVaibhavkumar Vachhani, MD  DATE OF DISCHARGE: No discharge date for patient encounter.  PRIMARY CARE PHYSICIAN: Patient, No Pcp Per    ADMISSION DIAGNOSIS:  CHF (congestive heart failure) (HCC) [I50.9] Acute on chronic respiratory failure with hypoxia (HCC) [J96.21] Chronic kidney disease, unspecified CKD stage [N18.9] Acute on chronic congestive heart failure, unspecified heart failure type (HCC) [I50.9]  DISCHARGE DIAGNOSIS:  Principal Problem:   Acute on chronic respiratory failure with hypoxia (HCC) Active Problems:   Acute CHF (congestive heart failure) (HCC)   Acute CHF (HCC)   SECONDARY DIAGNOSIS:   Past Medical History:  Diagnosis Date  . Atrial fibrillation (HCC)   . CHF (congestive heart failure) (HCC)   . Diabetes mellitus without complication (HCC)   . Hypertension   . Renal disorder     HOSPITAL COURSE:  1 acute on chronic systolic congestive heart failure exacerbation Patient admitted to regular nursing floor bed, treated with IV Lasix, placed on our congestive heart failure protocol, echocardiogram noted for mildly reduced ejection fraction 45%, on the following day patient was much better/back to baseline, patient referred to heart failure clinic for reevaluation status post discharge, patient to follow-up with primary care provider into 3 days for reevaluation, patient advised to adhere to cardiac/low-sodium diet with fluid restriction 1.5 L daily going forward  2 acute respiratory failure with hypoxia  Resolved Secondary to above  Patient successfully weaned off oxygen  3 chronic diabetes mellitus type 2 Stable on home regiment  4 chronic benign essential hypertension Stable on coreg and losartan  5 chronic valve replacement, metalic valve Stable Continued  Coumadin while in house     DISCHARGE CONDITIONS:   On day of discharge patient is afebrile, hemogram stable, tolerating diet, ready for discharge home with appropriate follow-up primary care provider into 3 days for reevaluation, heart failure clinic status post discharge  CONSULTS OBTAINED:  Treatment Team:  Laurier NancyKhan, Shaukat A, MD  DRUG ALLERGIES:  No Known Allergies  DISCHARGE MEDICATIONS:   Allergies as of 11/17/2017   No Known Allergies     Medication List    TAKE these medications   ALPRAZolam 0.5 MG tablet Commonly known as:  XANAX Take 1 tablet by mouth 2 (two) times daily.   carvedilol 25 MG tablet Commonly known as:  COREG Take 50 mg by mouth 2 (two) times daily.   furosemide 40 MG tablet Commonly known as:  LASIX Take 1 tablet (40 mg total) by mouth 2 (two) times daily. What changed:  when to take this   gabapentin 300 MG capsule Commonly known as:  NEURONTIN Take 900 mg by mouth 4 (four) times daily.   glimepiride 2 MG tablet Commonly known as:  AMARYL Take 2 mg by mouth daily with breakfast.   insulin detemir 100 UNIT/ML injection Commonly known as:  LEVEMIR Inject 50 Units into the skin 2 (two) times daily.   losartan 100 MG tablet Commonly known as:  COZAAR Take 100 mg by mouth 2 (two) times daily.   NOVOLOG 100 UNIT/ML injection Generic drug:  insulin aspart Inject 20-25 Units into the skin 3 (three) times daily.   tiZANidine 4 MG tablet Commonly known as:  ZANAFLEX Take 4 mg by mouth every 8 (eight) hours as needed for muscle spasms.   warfarin 5 MG tablet  Commonly known as:  COUMADIN Take 5 mg by mouth daily.        DISCHARGE INSTRUCTIONS:   If you experience worsening of your admission symptoms, develop shortness of breath, life threatening emergency, suicidal or homicidal thoughts you must seek medical attention immediately by calling 911 or calling your MD immediately  if symptoms less severe.  You Must read complete  instructions/literature along with all the possible adverse reactions/side effects for all the Medicines you take and that have been prescribed to you. Take any new Medicines after you have completely understood and accept all the possible adverse reactions/side effects.   Please note  You were cared for by a hospitalist during your hospital stay. If you have any questions about your discharge medications or the care you received while you were in the hospital after you are discharged, you can call the unit and asked to speak with the hospitalist on call if the hospitalist that took care of you is not available. Once you are discharged, your primary care physician will handle any further medical issues. Please note that NO REFILLS for any discharge medications will be authorized once you are discharged, as it is imperative that you return to your primary care physician (or establish a relationship with a primary care physician if you do not have one) for your aftercare needs so that they can reassess your need for medications and monitor your lab values.    Today   CHIEF COMPLAINT:   Chief Complaint  Patient presents with  . Abdominal Pain  . Edema    HISTORY OF PRESENT ILLNESS:   HISTORY OF PRESENT ILLNESS: Alexander Alexander  is a 47 y.o. male with a known history of CHF, Valve replacement, Htn, DM, obese- Moved from [place] 2 mth ago. Gaining weight and have worsenign abd and leg swelling. Progressive SOb with minimal exertion, so came to ER. Not following dietary restrictions and drinks a lot of liquids. Noted in CHF and have hypoxia. Have Home o2 as needed, but need to keep on all the time for a few days.    VITAL SIGNS:  Blood pressure (!) 123/100, pulse (!) 108, temperature (!) 97.4 F (36.3 C), temperature source Oral, resp. rate 18, height 5\' 10"  (1.778 m), weight (!) 166.3 kg (366 lb 9.6 oz), SpO2 94 %.  I/O:    Intake/Output Summary (Last 24 hours) at 11/17/2017 1352 Last data  filed at 11/17/2017 1016 Gross per 24 hour  Intake 600 ml  Output 2850 ml  Net -2250 ml    PHYSICAL EXAMINATION:  GENERAL:  47 y.o.-year-old patient lying in the bed with no acute distress.  EYES: Pupils equal, round, reactive to light and accommodation. No scleral icterus. Extraocular muscles intact.  HEENT: Head atraumatic, normocephalic. Oropharynx and nasopharynx clear.  NECK:  Supple, no jugular venous distention. No thyroid enlargement, no tenderness.  LUNGS: Normal breath sounds bilaterally, no wheezing, rales,rhonchi or crepitation. No use of accessory muscles of respiration.  CARDIOVASCULAR: S1, S2 normal. No murmurs, rubs, or gallops.  ABDOMEN: Soft, non-tender, non-distended. Bowel sounds present. No organomegaly or mass.  EXTREMITIES: No pedal edema, cyanosis, or clubbing.  NEUROLOGIC: Cranial nerves II through XII are intact. Muscle strength 5/5 in all extremities. Sensation intact. Gait not checked.  PSYCHIATRIC: The patient is alert and oriented x 3.  SKIN: No obvious rash, lesion, or ulcer.   DATA REVIEW:   CBC Recent Labs  Lab 11/17/17 0351  WBC 6.3  HGB 16.9  HCT 52.2*  PLT 213    Chemistries  Recent Labs  Lab 11/16/17 1057 11/17/17 0351  NA 137 141  K 5.1 4.3  CL 98* 96*  CO2 32 34*  GLUCOSE 390* 262*  BUN 30* 31*  CREATININE 1.93* 1.82*  CALCIUM 8.6* 9.1  MG 2.2  --   AST 26  --   ALT 27  --   ALKPHOS 85  --   BILITOT 1.0  --     Cardiac Enzymes Recent Labs  Lab 11/16/17 2024  TROPONINI <0.03    Microbiology Results  No results found for this or any previous visit.  RADIOLOGY:  Dg Chest 2 View  Result Date: 11/16/2017 CLINICAL DATA:  Shortness of breath with soft tissue edema EXAM: CHEST  2 VIEW COMPARISON:  None. FINDINGS: There is slight atelectasis in the left base. There is no frank edema or consolidation. There is cardiomegaly with mild pulmonary venous hypertension. Patient is status post aortic valve replacement. No evident  adenopathy. No bone lesions. IMPRESSION: Pulmonary vascular congestion without frank edema or consolidation. Mild left base atelectasis. Status post aortic valve replacement. Electronically Signed   By: Bretta Bang III M.D.   On: 11/16/2017 12:27    EKG:   Orders placed or performed during the hospital encounter of 11/16/17  . EKG 12-Lead  . EKG 12-Lead  . EKG 12-Lead  . EKG 12-Lead      Management plans discussed with the patient, family and they are in agreement.  CODE STATUS:     Code Status Orders  (From admission, onward)        Start     Ordered   11/16/17 1638  Full code  Continuous     11/16/17 1637    Code Status History    Date Active Date Inactive Code Status Order ID Comments User Context   This patient has a current code status but no historical code status.      TOTAL TIME TAKING CARE OF THIS PATIENT: 45 minutes.    Evelena Asa Pritesh Sobecki M.D on 11/17/2017 at 1:52 PM  Between 7am to 6pm - Pager - [phone]  After 6pm go to [email or web address] - password [email or web address]  [facility]  Office  [phone]  CC: Primary care physician; Patient, No Pcp Per   Note: This dictation was prepared with Dragon dictation along with smaller phrase technology. Any transcriptional errors that result from this process are unintentional.

## 2017-11-17 NOTE — Progress Notes (Addendum)
Inpatient Diabetes Program Recommendations  AACE/ADA: New Consensus Statement on Inpatient Glycemic Control (2015)  Target Ranges:  Prepandial:   less than 140 mg/dL      Peak postprandial:   less than 180 mg/dL (1-2 hours)      Critically ill patients:  140 - 180 mg/dL   Lab Results  Component Value Date   GLUCAP 273 (H) 11/17/2017    Review of Glycemic Control  Results for Alexander Alexander, [place] (MRN [id number]) as of 11/17/2017 11:02  Ref. Range 11/16/2017 17:04 11/16/2017 20:47 11/16/2017 21:57 11/17/2017 07:36  Glucose-Capillary Latest Ref Range: 65 - 99 mg/dL 409319 (H) 811346 (H) 914319 (H) 273 (H)    Diabetes history: Type 2 Outpatient Diabetes medications: Amaryl 2mg  qam, Levemir 50 units bid, Novolog 20-25units tid  Current orders for Inpatient glycemic control: Levemir 50 units bid, Novolog 0-9 units tid, Novolog 15 units tid  Inpatient Diabetes Program Recommendations: Please add Novolog 10 units tid with meals (hold if the patient eats less then 50%), increase Novolog correction to resistance correction 0-20 units tid. Add Novolog 0-5 units qhs.    Per ADA recommendations "consider performing an A1C on all patients with diabetes or hyperglycemia admitted to the hospital if not performed in the prior 3 months".   Susette RacerJulie Phillis Thackeray, RN, BA, MHA, CDE Diabetes Coordinator Inpatient Diabetes Program  [phone] (Team Pager) [phone] [facility] Office) 11/17/2017 11:13 AM

## 2017-11-17 NOTE — Consult Note (Signed)
Alexander Alexander is a 47 y.o. male  [id number]  Primary Cardiologist: New to Dr. Adrian Blackwater Reason for Consultation: CHF, Afib RVR  HPI: Alexander Alexander is a 47yo male with a PMH of CHF, DM, chronic kidney disease, persistent afib, mechanical aortic valve replacement, and repair of aortic aneurysm who presented to the ER with worsening edema in his abdomen and increased shortness of breath over the past few weeks. He recently moved from [place] and is seeking to establish care with a new cardiologist. Of note his aortic valve was replaced in 2015 and he had a cardiac cath at that time which he reports was "normal". He has undergone ablation and cardioversion for his AF which were both unsuccessful.    Review of Systems: Feeling better this morning. No chest pain, no shortness of breath. Abdominal tightness has improved. He states he would like to go home today.    Past Medical History:  Diagnosis Date  . Atrial fibrillation (HCC)   . CHF (congestive heart failure) (HCC)   . Diabetes mellitus without complication (HCC)   . Hypertension   . Renal disorder     Medications Prior to Admission  Medication Sig Dispense Refill  . ALPRAZolam (XANAX) 0.5 MG tablet Take 1 tablet by mouth 2 (two) times daily.  1  . carvedilol (COREG) 25 MG tablet Take 50 mg by mouth 2 (two) times daily.  3  . furosemide (LASIX) 40 MG tablet Take 40 mg by mouth daily.  1  . gabapentin (NEURONTIN) 300 MG capsule Take 900 mg by mouth 4 (four) times daily.    Marland Kitchen glimepiride (AMARYL) 2 MG tablet Take 2 mg by mouth daily with breakfast.    . insulin detemir (LEVEMIR) 100 UNIT/ML injection Inject 50 Units into the skin 2 (two) times daily.    Marland Kitchen losartan (COZAAR) 100 MG tablet Take 100 mg by mouth 2 (two) times daily.    Marland Kitchen NOVOLOG 100 UNIT/ML injection Inject 20-25 Units into the skin 3 (three) times daily.   2  . tiZANidine (ZANAFLEX) 4 MG tablet Take 4 mg by mouth every 8 (eight) hours as needed for muscle spasms.    Marland Kitchen  warfarin (COUMADIN) 5 MG tablet Take 5 mg by mouth daily.  2     . ALPRAZolam  0.5 mg Oral BID  . carvedilol  25 mg Oral BID  . furosemide  40 mg Intravenous TID  . insulin aspart  0-9 Units Subcutaneous TID AC & HS  . insulin aspart  15 Units Subcutaneous TID WC  . insulin detemir  50 Units Subcutaneous BID  . losartan  100 mg Oral BID  . pregabalin  100 mg Oral TID  . warfarin  7.5 mg Oral ONCE-1800  . Warfarin - Pharmacist Dosing Inpatient   Does not apply q1800    Infusions:   No Known Allergies  Social History   Socioeconomic History  . Marital status: Married    Spouse name: Not on file  . Number of children: Not on file  . Years of education: Not on file  . Highest education level: Not on file  Social Needs  . Financial resource strain: Not on file  . Food insecurity - worry: Not on file  . Food insecurity - inability: Not on file  . Transportation needs - medical: Not on file  . Transportation needs - non-medical: Not on file  Occupational History  . Not on file  Tobacco Use  . Smoking status: Never  Smoker  . Smokeless tobacco: Never Used  Substance and Sexual Activity  . Alcohol use: No    Frequency: Never  . Drug use: No  . Sexual activity: Not on file  Other Topics Concern  . Not on file  Social History Narrative  . Not on file    Family History  Problem Relation Age of Onset  . CAD Mother     PHYSICAL EXAM: Vitals:   11/17/17 0245 11/17/17 0734  BP: (!) 119/95 (!) 131/98  Pulse: (!) 106 (!) 107  Resp: 18 18  Temp: 98 F (36.7 C) (!) 97.4 F (36.3 C)  SpO2: 90% 94%     Intake/Output Summary (Last 24 hours) at 11/17/2017 0907 Last data filed at 11/16/2017 2158 Gross per 24 hour  Intake 240 ml  Output 3420 ml  Net -3180 ml    General:  Well appearing. No respiratory difficulty HEENT: normal Neck: supple. no JVD. Carotids 2+ bilat; no bruits. No lymphadenopathy or thryomegaly appreciated. Cor: PMI nondisplaced. Regular rate &  rhythm.  Mechanical valve click noted.  Lungs: clear Abdomen: Firm, distended Extremities: no cyanosis, clubbing, rash, edema Neuro: alert & oriented x 3, cranial nerves grossly intact. moves all 4 extremities w/o difficulty. Affect pleasant.  ECG: Afib 110-120 ventricular rate.   Results for orders placed or performed during the hospital encounter of 11/16/17 (from the past 24 hour(s))  CBC with Differential/Platelet     Status: Abnormal   Collection Time: 11/16/17 10:57 AM  Result Value Ref Range   WBC 6.9 3.8 - 10.6 K/uL   RBC 5.74 4.40 - 5.90 MIL/uL   Hemoglobin 16.0 13.0 - 18.0 g/dL   HCT 16.1 09.6 - 04.5 %   MCV 85.7 80.0 - 100.0 fL   MCH 27.9 26.0 - 34.0 pg   MCHC 32.5 32.0 - 36.0 g/dL   RDW 40.9 (H) 81.1 - 91.4 %   Platelets 197 150 - 440 K/uL   Neutrophils Relative % 43 %   Neutro Abs 3.0 1.4 - 6.5 K/uL   Lymphocytes Relative 40 %   Lymphs Abs 2.7 1.0 - 3.6 K/uL   Monocytes Relative 14 %   Monocytes Absolute 1.0 0.2 - 1.0 K/uL   Eosinophils Relative 2 %   Eosinophils Absolute 0.1 0 - 0.7 K/uL   Basophils Relative 1 %   Basophils Absolute 0.1 0 - 0.1 K/uL  Comprehensive metabolic panel     Status: Abnormal   Collection Time: 11/16/17 10:57 AM  Result Value Ref Range   Sodium 137 135 - 145 mmol/L   Potassium 5.1 3.5 - 5.1 mmol/L   Chloride 98 (L) 101 - 111 mmol/L   CO2 32 22 - 32 mmol/L   Glucose, Bld 390 (H) 65 - 99 mg/dL   BUN 30 (H) 6 - 20 mg/dL   Creatinine, Ser 7.82 (H) 0.61 - 1.24 mg/dL   Calcium 8.6 (L) 8.9 - 10.3 mg/dL   Total Protein 7.8 6.5 - 8.1 g/dL   Albumin 3.7 3.5 - 5.0 g/dL   AST 26 15 - 41 U/L   ALT 27 17 - 63 U/L   Alkaline Phosphatase 85 38 - 126 U/L   Total Bilirubin 1.0 0.3 - 1.2 mg/dL   GFR calc non Af Amer 39 (L) >60 mL/min   GFR calc Af Amer 46 (L) >60 mL/min   Anion gap 7 5 - 15  Troponin I     Status: None   Collection Time: 11/16/17 10:57 AM  Result Value Ref Range   Troponin I <0.03 <0.03 ng/mL  Magnesium     Status: None    Collection Time: 11/16/17 10:57 AM  Result Value Ref Range   Magnesium 2.2 1.7 - 2.4 mg/dL  Urinalysis, Complete w Microscopic     Status: Abnormal   Collection Time: 11/16/17 10:57 AM  Result Value Ref Range   Color, Urine YELLOW (A) YELLOW   APPearance CLEAR (A) CLEAR   Specific Gravity, Urine 1.030 1.005 - 1.030   pH 5.0 5.0 - 8.0   Glucose, UA >=500 (A) NEGATIVE mg/dL   Hgb urine dipstick NEGATIVE NEGATIVE   Bilirubin Urine NEGATIVE NEGATIVE   Ketones, ur NEGATIVE NEGATIVE mg/dL   Protein, ur 30 (A) NEGATIVE mg/dL   Nitrite NEGATIVE NEGATIVE   Leukocytes, UA NEGATIVE NEGATIVE   RBC / HPF 0-5 0 - 5 RBC/hpf   WBC, UA 6-30 0 - 5 WBC/hpf   Bacteria, UA NONE SEEN NONE SEEN   Squamous Epithelial / LPF 0-5 (A) NONE SEEN  Brain natriuretic peptide     Status: Abnormal   Collection Time: 11/16/17 10:57 AM  Result Value Ref Range   B Natriuretic Peptide 117.0 (H) 0.0 - 100.0 pg/mL  Protime-INR     Status: Abnormal   Collection Time: 11/16/17 11:17 AM  Result Value Ref Range   Prothrombin Time 22.5 (H) 11.4 - 15.2 seconds   INR 2.00   Troponin I     Status: Abnormal   Collection Time: 11/16/17  4:12 PM  Result Value Ref Range   Troponin I 0.03 (HH) <0.03 ng/mL  Glucose, capillary     Status: Abnormal   Collection Time: 11/16/17  5:04 PM  Result Value Ref Range   Glucose-Capillary 319 (H) 65 - 99 mg/dL  Troponin I     Status: None   Collection Time: 11/16/17  6:43 PM  Result Value Ref Range   Troponin I <0.03 <0.03 ng/mL  Troponin I     Status: None   Collection Time: 11/16/17  8:24 PM  Result Value Ref Range   Troponin I <0.03 <0.03 ng/mL  Glucose, capillary     Status: Abnormal   Collection Time: 11/16/17  8:47 PM  Result Value Ref Range   Glucose-Capillary 346 (H) 65 - 99 mg/dL  Glucose, capillary     Status: Abnormal   Collection Time: 11/16/17  9:57 PM  Result Value Ref Range   Glucose-Capillary 319 (H) 65 - 99 mg/dL  Basic metabolic panel     Status: Abnormal    Collection Time: 11/17/17  3:51 AM  Result Value Ref Range   Sodium 141 135 - 145 mmol/L   Potassium 4.3 3.5 - 5.1 mmol/L   Chloride 96 (L) 101 - 111 mmol/L   CO2 34 (H) 22 - 32 mmol/L   Glucose, Bld 262 (H) 65 - 99 mg/dL   BUN 31 (H) 6 - 20 mg/dL   Creatinine, Ser 4.691.82 (H) 0.61 - 1.24 mg/dL   Calcium 9.1 8.9 - 62.910.3 mg/dL   GFR calc non Af Amer 42 (L) >60 mL/min   GFR calc Af Amer 49 (L) >60 mL/min   Anion gap 11 5 - 15  CBC     Status: Abnormal   Collection Time: 11/17/17  3:51 AM  Result Value Ref Range   WBC 6.3 3.8 - 10.6 K/uL   RBC 6.10 (H) 4.40 - 5.90 MIL/uL   Hemoglobin 16.9 13.0 - 18.0 g/dL   HCT 52.852.2 (H)  40.0 - 52.0 %   MCV 85.5 80.0 - 100.0 fL   MCH 27.8 26.0 - 34.0 pg   MCHC 32.5 32.0 - 36.0 g/dL   RDW 11.9 (H) 14.7 - 82.9 %   Platelets 213 150 - 440 K/uL  Protime-INR     Status: Abnormal   Collection Time: 11/17/17  3:51 AM  Result Value Ref Range   Prothrombin Time 22.6 (H) 11.4 - 15.2 seconds   INR 2.01   Glucose, capillary     Status: Abnormal   Collection Time: 11/17/17  7:36 AM  Result Value Ref Range   Glucose-Capillary 273 (H) 65 - 99 mg/dL   Dg Chest 2 View  Result Date: 11/16/2017 CLINICAL DATA:  Shortness of breath with soft tissue edema EXAM: CHEST  2 VIEW COMPARISON:  None. FINDINGS: There is slight atelectasis in the left base. There is no frank edema or consolidation. There is cardiomegaly with mild pulmonary venous hypertension. Patient is status post aortic valve replacement. No evident adenopathy. No bone lesions. IMPRESSION: Pulmonary vascular congestion without frank edema or consolidation. Mild left base atelectasis. Status post aortic valve replacement. Electronically Signed   By: Bretta Bang III M.D.   On: 11/16/2017 12:27     ASSESSMENT AND PLAN:  Persistent Afib RVR: Persistent Afib with history of failed attempts to convert and a failed ablation. Rate is not well controlled, advise adding metoprolol 16m BID to coreg 25mg  BID for  improved rate control. Reluctant to use digoxin due to CKD or stop coreg due to borderline EF. Consider diltiazem if rate is not well controlled.   CHF: LVEF 45-50%, mechanical Aortic valve functioning well.  Taking 40mg  oral lasix at home. Responding well to IV lasix.   History of mechanical aortic valve replacement: INR is 2.01 and is subtherapeutic for mechanical valve, dose increased by pharmacist. Monitor response with INR goal of 2.5-3.5.  Chronic kidney disease: Creatinine elevated, avoid nephrotoxic medications.   HTN: Mildly elevated. Will continue to monitor.   May discharge pt today and will follow up outpatient with Dr. Welton Flakes [facility] 10am Monday 11/21/17.    Caroleen Hamman, NP-C Cell: [phone]

## 2017-11-17 NOTE — Care Management (Signed)
patient has qualified for continuous oxygen.  No agency preference. obtained order and referral to [facility]

## 2017-11-18 DIAGNOSIS — J9621 Acute and chronic respiratory failure with hypoxia: Secondary | ICD-10-CM | POA: Diagnosis not present

## 2017-11-18 LAB — HIV ANTIBODY (ROUTINE TESTING W REFLEX): HIV Screen 4th Generation wRfx: NONREACTIVE

## 2017-11-23 NOTE — Progress Notes (Signed)
Patient ID: Alexander Alexander, male    DOB: 1971/10/18, 47 y.o.   MRN: [id number]  HPI  Alexander Alexander is a 47 y/o male with a history of atrial fibrillation, DM, HTN, CKD, sleep apnea, anxiety, previous tobacco use and chronic heart failure.   Echo report from 11/16/17 showed an EF of 45%.  Admitted 11/16/17 due to HF exacerbation. Initially needed IV diuretics and then transitioned to oral diuretics. Cardiology consult obtained. Discharged the following day.   He presents today for his initial visit with a chief complaint of moderate shortness of breath upon minimal exertion. He says this has been present for several months with varying levels of severity. He has associated fatigue, abdominal distention, dizziness, numbness in feet and difficulty sleeping. He denies any chest pain, cough, edema, palpitations or weight gain.   Past Medical History:  Diagnosis Date  . Anxiety   . Arrhythmia   . Atrial fibrillation (HCC)   . CHF (congestive heart failure) (HCC)   . Diabetes mellitus without complication (HCC)   . Hypertension   . Renal disorder   . Sleep apnea    Past Surgical History:  Procedure Laterality Date  . ABLATION    . CARDIAC VALVE REPLACEMENT    . VALVE REPLACEMENT     Family History  Problem Relation Age of Onset  . CAD Mother    Social History   Tobacco Use  . Smoking status: Former Smoker    Years: 15.00    Types: Cigarettes    Last attempt to quit: 04/25/2014    Years since quitting: 3.5  . Smokeless tobacco: Never Used  . Tobacco comment: quit around june 2015  Substance Use Topics  . Alcohol use: No    Frequency: Never   No Known Allergies Prior to Admission medications   Medication Sig Start Date End Date Taking? Authorizing Provider  acetaminophen (TYLENOL) 325 MG tablet Take 650 mg by mouth every 6 (six) hours as needed.   Yes [provider]  ALPRAZolam Prudy Feeler) 0.5 MG tablet Take 1 tablet by mouth 2 (two) times daily. 10/05/17  Yes [provider]  carvedilol (COREG) 25 MG tablet Take 25 mg by mouth 2 (two) times daily.  10/21/17  Yes [provider]  furosemide (LASIX) 40 MG tablet Take 1 tablet (40 mg total) by mouth 2 (two) times daily. 11/17/17  Yes Salary, Evelena Asa, MD  gabapentin (NEURONTIN) 300 MG capsule Take 900 mg by mouth 3 (three) times daily.    Yes [provider]  glimepiride (AMARYL) 2 MG tablet Take 2 mg by mouth daily with breakfast.   Yes [provider]  insulin detemir (LEVEMIR) 100 UNIT/ML injection Inject 50 Units into the skin 2 (two) times daily.   Yes [provider]  losartan (COZAAR) 100 MG tablet Take 100 mg by mouth daily.    Yes [provider]  NOVOLOG 100 UNIT/ML injection Inject 20-25 Units into the skin 3 (three) times daily.  10/30/17  Yes [provider]  tiZANidine (ZANAFLEX) 4 MG tablet Take 4 mg by mouth every 8 (eight) hours as needed for muscle spasms.   Yes [provider]  warfarin (COUMADIN) 5 MG tablet Take 5 mg by mouth daily. 10/04/17  Yes [provider]    Review of Systems  Constitutional: Positive for fatigue. Negative for appetite change.  HENT: Negative for congestion, postnasal drip and sore throat.   Eyes: Negative.   Respiratory: Positive for shortness of breath.  Negative for cough and chest tightness.   Cardiovascular: Negative for chest pain and leg swelling.  Gastrointestinal: Positive for abdominal distention. Negative for abdominal pain.  Endocrine: Negative.   Genitourinary: Negative.   Musculoskeletal: Positive for arthralgias (feet pain).  Skin: Negative.   Allergic/Immunologic: Negative.   Neurological: Positive for dizziness and numbness (in feet). Negative for light-headedness.  Hematological: Negative for adenopathy. Bruises/bleeds easily.  Psychiatric/Behavioral: Positive for sleep disturbance (not wearing CPAP). Negative for dysphoric mood. The patient is not nervous/anxious.     Vitals:   11/25/17 0823  BP: 104/65  Pulse: 69  Resp: (!) 24  Temp: 97.9 F (36.6 C)  TempSrc: Oral  SpO2: 90%  Weight: (!) 379 lb (171.9 kg)  Height: 5\' 10"  (1.778 m)   Wt Readings from Last 3 Encounters:  11/25/17 (!) 379 lb (171.9 kg)  11/17/17 (!) 366 lb 9.6 oz (166.3 kg)   Lab Results  Component Value Date   CREATININE 1.82 (H) 11/17/2017   CREATININE 1.93 (H) 11/16/2017    Physical Exam  Constitutional: He is oriented to person, place, and time. He appears well-developed and well-nourished.  HENT:  Head: Normocephalic and atraumatic.  Neck: Normal range of motion. Neck supple. JVD present.  Cardiovascular: Normal rate. An irregular rhythm present.  Pulmonary/Chest: Effort normal. He has no wheezes. He has no rales.  Abdominal: He exhibits distension. There is no tenderness.  Musculoskeletal: He exhibits no edema or tenderness.  Neurological: He is alert and oriented to person, place, and time.  Skin: Skin is warm and dry.  Psychiatric: He has a normal mood and affect. His behavior is normal. Thought content normal.  Nursing note and vitals reviewed.   Assessment & Plan:  1: Chronic heart failure with preserved ejection fraction- - NYHA class III - moderately fluid overloaded today - weighing daily and has noticed a gradual weight gain. Instructed to call for an overnight weight gain of >2 pounds or a weekly weight gain of >5 pounds - weight up 13 pounds since hospital discharge 8 days ago - will change his diuretic to torsemide 40mg  BID and stop the furosemide - not adding salt to his food and has been trying to read food labels. Discussed the importance of closely following a 2000mg  sodium diet and written dietary information was given to him about this - drinking ~ 48-64 ounces of fluid daily - patient reports receiving his flu and pneumonia vaccines for this season - [facility] - BNP from 11/16/17 was 117.0 - will plan  on checking BMP at his next visit since changing diuretic; currently not taking any potassium supplement  2: HTN- - BP on the low side today - advised to check it at home every few days - sees PCP Beryle Flock([place]) on 11/28/17 - BMP from 11/17/17 reviewed and showed sodium 141, potassium 4.3 and GFR 49  3: Diabetes- - glucose in clinic was 384 fasting - ate unsweetened applesauce in the middle of the night - [facility] and may also need endocrinology referral  4: Aortic valve replacement-  - INR from 11/17/17 was 2.01  Patient did not bring his medications nor a list. Each medication was verbally reviewed with the patient and he was encouraged to bring the bottles to every visit to confirm accuracy of list.

## 2017-11-25 ENCOUNTER — Ambulatory Visit: Payer: Medicare HMO | Attending: Family | Admitting: Family

## 2017-11-25 ENCOUNTER — Other Ambulatory Visit: Payer: Self-pay

## 2017-11-25 ENCOUNTER — Encounter: Payer: Self-pay | Admitting: Family

## 2017-11-25 DIAGNOSIS — Z87891 Personal history of nicotine dependence: Secondary | ICD-10-CM | POA: Diagnosis not present

## 2017-11-25 DIAGNOSIS — Z952 Presence of prosthetic heart valve: Secondary | ICD-10-CM

## 2017-11-25 DIAGNOSIS — F419 Anxiety disorder, unspecified: Secondary | ICD-10-CM | POA: Diagnosis not present

## 2017-11-25 DIAGNOSIS — Z794 Long term (current) use of insulin: Secondary | ICD-10-CM | POA: Diagnosis not present

## 2017-11-25 DIAGNOSIS — I5032 Chronic diastolic (congestive) heart failure: Secondary | ICD-10-CM | POA: Diagnosis not present

## 2017-11-25 DIAGNOSIS — E1122 Type 2 diabetes mellitus with diabetic chronic kidney disease: Secondary | ICD-10-CM | POA: Insufficient documentation

## 2017-11-25 DIAGNOSIS — I10 Essential (primary) hypertension: Secondary | ICD-10-CM | POA: Insufficient documentation

## 2017-11-25 DIAGNOSIS — I5042 Chronic combined systolic (congestive) and diastolic (congestive) heart failure: Secondary | ICD-10-CM | POA: Insufficient documentation

## 2017-11-25 DIAGNOSIS — I130 Hypertensive heart and chronic kidney disease with heart failure and stage 1 through stage 4 chronic kidney disease, or unspecified chronic kidney disease: Secondary | ICD-10-CM | POA: Diagnosis not present

## 2017-11-25 DIAGNOSIS — I4891 Unspecified atrial fibrillation: Secondary | ICD-10-CM | POA: Diagnosis not present

## 2017-11-25 DIAGNOSIS — G4730 Sleep apnea, unspecified: Secondary | ICD-10-CM | POA: Insufficient documentation

## 2017-11-25 DIAGNOSIS — N189 Chronic kidney disease, unspecified: Secondary | ICD-10-CM | POA: Diagnosis not present

## 2017-11-25 DIAGNOSIS — N183 Chronic kidney disease, stage 3 unspecified: Secondary | ICD-10-CM

## 2017-11-25 DIAGNOSIS — Z7901 Long term (current) use of anticoagulants: Secondary | ICD-10-CM | POA: Diagnosis not present

## 2017-11-25 DIAGNOSIS — Z79899 Other long term (current) drug therapy: Secondary | ICD-10-CM | POA: Insufficient documentation

## 2017-11-25 DIAGNOSIS — E119 Type 2 diabetes mellitus without complications: Secondary | ICD-10-CM | POA: Insufficient documentation

## 2017-11-25 DIAGNOSIS — R69 Illness, unspecified: Secondary | ICD-10-CM | POA: Diagnosis not present

## 2017-11-25 LAB — GLUCOSE, CAPILLARY: GLUCOSE-CAPILLARY: 384 mg/dL — AB (ref 65–99)

## 2017-11-25 MED ORDER — TORSEMIDE 20 MG PO TABS: 40.0000 mg | ORAL_TABLET | Freq: Two times a day (BID) | ORAL | 3 refills | Status: DC

## 2017-11-25 NOTE — Patient Instructions (Signed)
Continue weighing daily and call for an overnight weight gain of > 2 pounds or a weekly weight gain of >5 pounds. 

## 2017-11-28 ENCOUNTER — Ambulatory Visit: Payer: Self-pay | Admitting: Family Medicine

## 2017-12-01 NOTE — Progress Notes (Deleted)
Patient ID: Alexander Alexander, male    DOB: 1970-12-18, 47 y.o.   MRN: [id number]  HPI  Mr Alexander Alexander is a 47 y/o male with a history of atrial fibrillation, DM, HTN, CKD, sleep apnea, anxiety, previous tobacco use and chronic heart failure.   Echo report from 11/16/17 showed an EF of 45%.  Admitted 11/16/17 due to HF exacerbation. Initially needed IV diuretics and then transitioned to oral diuretics. Cardiology consult obtained. Discharged the following day.   He presents today for a follow-up visit with a chief complaint of    Past Medical History:  Diagnosis Date  . Anxiety   . Arrhythmia   . Atrial fibrillation (HCC)   . CHF (congestive heart failure) (HCC)   . Diabetes mellitus without complication (HCC)   . Hypertension   . Renal disorder   . Sleep apnea    Past Surgical History:  Procedure Laterality Date  . ABLATION    . CARDIAC VALVE REPLACEMENT    . VALVE REPLACEMENT     Family History  Problem Relation Age of Onset  . CAD Mother    Social History   Tobacco Use  . Smoking status: Former Smoker    Years: 15.00    Types: Cigarettes    Last attempt to quit: 04/25/2014    Years since quitting: 3.6  . Smokeless tobacco: Never Used  . Tobacco comment: quit around june 2015  Substance Use Topics  . Alcohol use: No    Frequency: Never   No Known Allergies   Review of Systems  Constitutional: Positive for fatigue. Negative for appetite change.  HENT: Negative for congestion, postnasal drip and sore throat.   Eyes: Negative.   Respiratory: Positive for shortness of breath. Negative for cough and chest tightness.   Cardiovascular: Negative for chest pain and leg swelling.  Gastrointestinal: Positive for abdominal distention. Negative for abdominal pain.  Endocrine: Negative.   Genitourinary: Negative.   Musculoskeletal: Positive for arthralgias (feet pain).  Skin: Negative.   Allergic/Immunologic: Negative.   Neurological: Positive for dizziness and numbness (in feet).  Negative for light-headedness.  Hematological: Negative for adenopathy. Bruises/bleeds easily.  Psychiatric/Behavioral: Positive for sleep disturbance (not wearing CPAP). Negative for dysphoric mood. The patient is not nervous/anxious.      Physical Exam  Constitutional: He is oriented to person, place, and time. He appears well-developed and well-nourished.  HENT:  Head: Normocephalic and atraumatic.  Neck: Normal range of motion. Neck supple. JVD present.  Cardiovascular: Normal rate. An irregular rhythm present.  Pulmonary/Chest: Effort normal. He has no wheezes. He has no rales.  Abdominal: He exhibits distension. There is no tenderness.  Musculoskeletal: He exhibits no edema or tenderness.  Neurological: He is alert and oriented to person, place, and time.  Skin: Skin is warm and dry.  Psychiatric: He has a normal mood and affect. His behavior is normal. Thought content normal.  Nursing note and vitals reviewed.   Assessment & Plan:  1: Chronic heart failure with preserved ejection fraction- - NYHA class III - moderately fluid overloaded today - weighing daily and has noticed a gradual weight gain. Instructed to call for an overnight weight gain of >2 pounds or a weekly weight gain of >5 pounds - weight up 13 pounds since hospital discharge 8 days ago - will change his diuretic to torsemide 40mg  BID and stop the furosemide - not adding salt to his food and has been trying to read food labels. Discussed the importance of closely following a  2000mg  sodium diet and written dietary information was given to him about this - drinking ~ 48-64 ounces of fluid daily - patient reports receiving his flu and pneumonia vaccines for this season - [facility] - BNP from 11/16/17 was 117.0 - will plan on checking BMP at his next visit since changing diuretic; currently not taking any potassium supplement  2: HTN- - BP on the low side today - advised to  check it at home every few days - sees PCP Beryle Flock([place]) on 12/09/17 - BMP from 11/17/17 reviewed and showed sodium 141, potassium 4.3 and GFR 49  3: Diabetes- - glucose in clinic was 384 fasting - ate unsweetened applesauce in the middle of the night - [facility] and may also need endocrinology referral  4: Aortic valve replacement-  - INR from 11/17/17 was 2.01  Patient did not bring his medications nor a list. Each medication was verbally reviewed with the patient and he was encouraged to bring the bottles to every visit to confirm accuracy of list.

## 2017-12-02 ENCOUNTER — Ambulatory Visit: Payer: Medicare HMO | Admitting: Family

## 2017-12-06 ENCOUNTER — Inpatient Hospital Stay
Admission: EM | Admit: 2017-12-06 | Discharge: 2017-12-08 | DRG: 308 | Disposition: A | Discharge status: Home / Self Care | Payer: Medicare HMO | Attending: Specialist | Admitting: Specialist

## 2017-12-06 ENCOUNTER — Other Ambulatory Visit: Payer: Self-pay

## 2017-12-06 ENCOUNTER — Emergency Department: Payer: Medicare HMO

## 2017-12-06 ENCOUNTER — Ambulatory Visit: Payer: Medicare HMO | Admitting: Family

## 2017-12-06 ENCOUNTER — Encounter: Payer: Self-pay | Admitting: Emergency Medicine

## 2017-12-06 DIAGNOSIS — I130 Hypertensive heart and chronic kidney disease with heart failure and stage 1 through stage 4 chronic kidney disease, or unspecified chronic kidney disease: Secondary | ICD-10-CM | POA: Diagnosis present

## 2017-12-06 DIAGNOSIS — G4730 Sleep apnea, unspecified: Secondary | ICD-10-CM | POA: Diagnosis present

## 2017-12-06 DIAGNOSIS — E875 Hyperkalemia: Secondary | ICD-10-CM

## 2017-12-06 DIAGNOSIS — I10 Essential (primary) hypertension: Secondary | ICD-10-CM | POA: Diagnosis not present

## 2017-12-06 DIAGNOSIS — Z794 Long term (current) use of insulin: Secondary | ICD-10-CM

## 2017-12-06 DIAGNOSIS — E1122 Type 2 diabetes mellitus with diabetic chronic kidney disease: Secondary | ICD-10-CM

## 2017-12-06 DIAGNOSIS — I509 Heart failure, unspecified: Secondary | ICD-10-CM

## 2017-12-06 DIAGNOSIS — R791 Abnormal coagulation profile: Secondary | ICD-10-CM | POA: Diagnosis present

## 2017-12-06 DIAGNOSIS — N183 Chronic kidney disease, stage 3 unspecified: Secondary | ICD-10-CM

## 2017-12-06 DIAGNOSIS — I959 Hypotension, unspecified: Secondary | ICD-10-CM | POA: Diagnosis present

## 2017-12-06 DIAGNOSIS — Z952 Presence of prosthetic heart valve: Secondary | ICD-10-CM | POA: Diagnosis not present

## 2017-12-06 DIAGNOSIS — Z9981 Dependence on supplemental oxygen: Secondary | ICD-10-CM | POA: Diagnosis not present

## 2017-12-06 DIAGNOSIS — Z8249 Family history of ischemic heart disease and other diseases of the circulatory system: Secondary | ICD-10-CM

## 2017-12-06 DIAGNOSIS — Z7901 Long term (current) use of anticoagulants: Secondary | ICD-10-CM | POA: Diagnosis not present

## 2017-12-06 DIAGNOSIS — E1165 Type 2 diabetes mellitus with hyperglycemia: Secondary | ICD-10-CM | POA: Diagnosis present

## 2017-12-06 DIAGNOSIS — Z9114 Patient's other noncompliance with medication regimen: Secondary | ICD-10-CM

## 2017-12-06 DIAGNOSIS — I482 Chronic atrial fibrillation: Secondary | ICD-10-CM | POA: Diagnosis present

## 2017-12-06 DIAGNOSIS — Z6841 Body Mass Index (BMI) 40.0 and over, adult: Secondary | ICD-10-CM | POA: Diagnosis not present

## 2017-12-06 DIAGNOSIS — J9600 Acute respiratory failure, unspecified whether with hypoxia or hypercapnia: Secondary | ICD-10-CM | POA: Diagnosis present

## 2017-12-06 DIAGNOSIS — I4891 Unspecified atrial fibrillation: Secondary | ICD-10-CM | POA: Diagnosis present

## 2017-12-06 DIAGNOSIS — I481 Persistent atrial fibrillation: Secondary | ICD-10-CM | POA: Diagnosis present

## 2017-12-06 DIAGNOSIS — Z87891 Personal history of nicotine dependence: Secondary | ICD-10-CM

## 2017-12-06 DIAGNOSIS — I444 Left anterior fascicular block: Secondary | ICD-10-CM | POA: Diagnosis present

## 2017-12-06 DIAGNOSIS — R0602 Shortness of breath: Secondary | ICD-10-CM | POA: Diagnosis not present

## 2017-12-06 DIAGNOSIS — J9621 Acute and chronic respiratory failure with hypoxia: Secondary | ICD-10-CM | POA: Diagnosis not present

## 2017-12-06 DIAGNOSIS — N189 Chronic kidney disease, unspecified: Secondary | ICD-10-CM | POA: Diagnosis not present

## 2017-12-06 DIAGNOSIS — N179 Acute kidney failure, unspecified: Secondary | ICD-10-CM | POA: Diagnosis present

## 2017-12-06 DIAGNOSIS — F419 Anxiety disorder, unspecified: Secondary | ICD-10-CM | POA: Diagnosis present

## 2017-12-06 DIAGNOSIS — E119 Type 2 diabetes mellitus without complications: Secondary | ICD-10-CM | POA: Diagnosis not present

## 2017-12-06 DIAGNOSIS — I5033 Acute on chronic diastolic (congestive) heart failure: Secondary | ICD-10-CM | POA: Diagnosis present

## 2017-12-06 DIAGNOSIS — R079 Chest pain, unspecified: Secondary | ICD-10-CM

## 2017-12-06 LAB — BRAIN NATRIURETIC PEPTIDE: B NATRIURETIC PEPTIDE 5: 64 pg/mL (ref 0.0–100.0)

## 2017-12-06 LAB — BASIC METABOLIC PANEL
ANION GAP: 9 (ref 5–15)
BUN: 47 mg/dL — ABNORMAL HIGH (ref 6–20)
CALCIUM: 9.4 mg/dL (ref 8.9–10.3)
CHLORIDE: 90 mmol/L — AB (ref 101–111)
CO2: 34 mmol/L — AB (ref 22–32)
CREATININE: 2.13 mg/dL — AB (ref 0.61–1.24)
GFR calc non Af Amer: 35 mL/min — ABNORMAL LOW (ref >60)
GFR, EST AFRICAN AMERICAN: 41 mL/min — AB (ref >60)
Glucose, Bld: 451 mg/dL — ABNORMAL HIGH (ref 65–99)
Potassium: 5.3 mmol/L — ABNORMAL HIGH (ref 3.5–5.1)
SODIUM: 133 mmol/L — AB (ref 135–145)

## 2017-12-06 LAB — CBC
HCT: 52.8 % — ABNORMAL HIGH (ref 40.0–52.0)
HEMOGLOBIN: 16.9 g/dL (ref 13.0–18.0)
MCH: 27.2 pg (ref 26.0–34.0)
MCHC: 31.9 g/dL — ABNORMAL LOW (ref 32.0–36.0)
MCV: 85.2 fL (ref 80.0–100.0)
Platelets: 195 10*3/uL (ref 150–440)
RBC: 6.2 MIL/uL — AB (ref 4.40–5.90)
RDW: 17.6 % — ABNORMAL HIGH (ref 11.5–14.5)
WBC: 5.7 10*3/uL (ref 3.8–10.6)

## 2017-12-06 LAB — APTT: APTT: 29 s (ref 24–36)

## 2017-12-06 LAB — FIBRIN DERIVATIVES D-DIMER (ARMC ONLY): FIBRIN DERIVATIVES D-DIMER (ARMC): 666.15 ng{FEU}/mL — AB (ref 0.00–499.00)

## 2017-12-06 LAB — GLUCOSE, CAPILLARY: Glucose-Capillary: 567 mg/dL (ref 65–99)

## 2017-12-06 LAB — PROTIME-INR
INR: 1.25
Prothrombin Time: 15.6 seconds — ABNORMAL HIGH (ref 11.4–15.2)

## 2017-12-06 LAB — TROPONIN I: Troponin I: 0.03 ng/mL (ref <0.03)

## 2017-12-06 MED ORDER — WARFARIN - PHARMACIST DOSING INPATIENT
Freq: Every day | Status: DC
  Administered 2017-12-07: 17:00:00

## 2017-12-06 MED ORDER — DIGOXIN 0.1 MG/ML IJ SOLN: 0.2500 mg | Freq: Once | INTRAMUSCULAR | Status: DC

## 2017-12-06 MED ORDER — HEPARIN (PORCINE) IN NACL 100-0.45 UNIT/ML-% IJ SOLN
1900.0000 [IU]/h | INTRAMUSCULAR | Status: DC
  Administered 2017-12-06: 1300 [IU]/h via INTRAVENOUS
  Administered 2017-12-07: 1500 [IU]/h via INTRAVENOUS
  Administered 2017-12-08: 1850 [IU]/h via INTRAVENOUS
  Filled 2017-12-06 (×4): qty 250

## 2017-12-06 MED ORDER — INSULIN REGULAR HUMAN 100 UNIT/ML IJ SOLN
10.0000 [IU] | Freq: Once | INTRAMUSCULAR | Status: AC
  Administered 2017-12-06: 10 [IU] via INTRAVENOUS
  Filled 2017-12-06: qty 0.1

## 2017-12-06 MED ORDER — HEPARIN BOLUS VIA INFUSION
4000.0000 [IU] | Freq: Once | INTRAVENOUS | Status: AC
  Administered 2017-12-06: 4000 [IU] via INTRAVENOUS
  Filled 2017-12-06: qty 4000

## 2017-12-06 MED ORDER — DOCUSATE SODIUM 100 MG PO CAPS: 100.0000 mg | ORAL_CAPSULE | Freq: Two times a day (BID) | ORAL | Status: DC | PRN

## 2017-12-06 MED ORDER — ALPRAZOLAM 0.5 MG PO TABS
0.5000 mg | ORAL_TABLET | Freq: Two times a day (BID) | ORAL | Status: DC
  Administered 2017-12-06 – 2017-12-08 (×4): 0.5 mg via ORAL
  Filled 2017-12-06 (×4): qty 1

## 2017-12-06 MED ORDER — INSULIN DETEMIR 100 UNIT/ML ~~LOC~~ SOLN
50.0000 [IU] | Freq: Two times a day (BID) | SUBCUTANEOUS | Status: DC
  Administered 2017-12-06 – 2017-12-08 (×4): 50 [IU] via SUBCUTANEOUS
  Filled 2017-12-06 (×8): qty 0.5

## 2017-12-06 MED ORDER — CARVEDILOL 12.5 MG PO TABS
12.5000 mg | ORAL_TABLET | Freq: Two times a day (BID) | ORAL | Status: DC
  Administered 2017-12-06 – 2017-12-08 (×5): 12.5 mg via ORAL
  Filled 2017-12-06: qty 1
  Filled 2017-12-06: qty 2
  Filled 2017-12-06 (×3): qty 1

## 2017-12-06 MED ORDER — AMIODARONE HCL 200 MG PO TABS
400.0000 mg | ORAL_TABLET | Freq: Every day | ORAL | Status: DC
  Administered 2017-12-06 – 2017-12-08 (×3): 400 mg via ORAL
  Filled 2017-12-06 (×3): qty 2

## 2017-12-06 MED ORDER — WARFARIN SODIUM 7.5 MG PO TABS
7.5000 mg | ORAL_TABLET | Freq: Once | ORAL | Status: AC
  Administered 2017-12-06: 7.5 mg via ORAL
  Filled 2017-12-06: qty 1

## 2017-12-06 MED ORDER — GABAPENTIN 300 MG PO CAPS
900.0000 mg | ORAL_CAPSULE | Freq: Three times a day (TID) | ORAL | Status: DC
  Administered 2017-12-06 – 2017-12-08 (×6): 900 mg via ORAL
  Filled 2017-12-06 (×8): qty 3

## 2017-12-06 MED ORDER — DIGOXIN 0.25 MG/ML IJ SOLN
0.2500 mg | Freq: Once | INTRAMUSCULAR | Status: AC
  Administered 2017-12-06: 0.25 mg via INTRAVENOUS
  Filled 2017-12-06: qty 1

## 2017-12-06 MED ORDER — AMIODARONE HCL 200 MG PO TABS: 400.0000 mg | ORAL_TABLET | Freq: Once | ORAL | Status: DC

## 2017-12-06 MED ORDER — HYDRALAZINE HCL 20 MG/ML IJ SOLN: 10.0000 mg | Freq: Four times a day (QID) | INTRAMUSCULAR | Status: DC | PRN

## 2017-12-06 MED ORDER — INSULIN ASPART 100 UNIT/ML ~~LOC~~ SOLN
0.0000 [IU] | Freq: Three times a day (TID) | SUBCUTANEOUS | Status: DC
  Administered 2017-12-07 – 2017-12-08 (×6): 15 [IU] via SUBCUTANEOUS
  Filled 2017-12-06 (×6): qty 1

## 2017-12-06 MED ORDER — PATIROMER SORBITEX CALCIUM 8.4 G PO PACK
8.4000 g | PACK | Freq: Once | ORAL | Status: DC
  Filled 2017-12-06: qty 4

## 2017-12-06 MED ORDER — GLIMEPIRIDE 2 MG PO TABS: 2.0000 mg | ORAL_TABLET | Freq: Every day | ORAL | Status: DC

## 2017-12-06 MED ORDER — TIZANIDINE HCL 4 MG PO TABS
4.0000 mg | ORAL_TABLET | Freq: Three times a day (TID) | ORAL | Status: DC | PRN
  Filled 2017-12-06: qty 1

## 2017-12-06 MED ORDER — TORSEMIDE 20 MG PO TABS
20.0000 mg | ORAL_TABLET | Freq: Two times a day (BID) | ORAL | Status: DC
  Administered 2017-12-06 – 2017-12-08 (×4): 20 mg via ORAL
  Filled 2017-12-06 (×6): qty 1

## 2017-12-06 NOTE — Progress Notes (Signed)
ANTICOAGULATION CONSULT NOTE - Initial Consult  Pharmacy Consult for warfarin Indication: atrial fibrillation  No Known Allergies  Patient Measurements: Height: 5\' 10"  (177.8 cm) Weight: (!) 375 lb 3.2 oz (170.2 kg) IBW/kg (Calculated) : 73 Heparin Dosing Weight:   Vital Signs: Temp: 98.1 F (36.7 C) (01/29 1701) BP: 160/142 (01/29 1701) Pulse Rate: 106 (01/29 1701)  Labs: Recent Labs    12/06/17 0958  HGB 16.9  HCT 52.8*  PLT 195  APTT 29  LABPROT 15.6*  INR 1.25  CREATININE 2.13*  TROPONINI 0.03*    Estimated Creatinine Clearance: 67.1 mL/min (A) (by C-G formula based on SCr of 2.13 mg/dL (H)).   Medical History: Past Medical History:  Diagnosis Date  . Anxiety   . Arrhythmia   . Atrial fibrillation (HCC)   . CHF (congestive heart failure) (HCC)   . Diabetes mellitus without complication (HCC)   . Hypertension   . Renal disorder   . Sleep apnea     Medications:  Medications Prior to Admission  Medication Sig Dispense Refill Last Dose  . acetaminophen (TYLENOL) 325 MG tablet Take 650 mg by mouth every 6 (six) hours as needed.   PRN at PRN  . ALPRAZolam (XANAX) 0.5 MG tablet Take 1 tablet by mouth 2 (two) times daily.  1 12/05/2017 at PM  . carvedilol (COREG) 25 MG tablet Take 25 mg by mouth 2 (two) times daily.   3 12/05/2017 at PM  . gabapentin (NEURONTIN) 300 MG capsule Take 900 mg by mouth 3 (three) times daily.    12/05/2017 at PM  . glimepiride (AMARYL) 2 MG tablet Take 2 mg by mouth daily with breakfast.   12/05/2017 at AM  . insulin detemir (LEVEMIR) 100 UNIT/ML injection Inject 50 Units into the skin 2 (two) times daily.   12/05/2017 at PM  . losartan (COZAAR) 100 MG tablet Take 100 mg by mouth daily.    12/05/2017 at AM  . NOVOLOG 100 UNIT/ML injection Inject 20-25 Units into the skin 3 (three) times daily.   2 12/05/2017 at PM  . tiZANidine (ZANAFLEX) 4 MG tablet Take 4 mg by mouth every 8 (eight) hours as needed for muscle spasms.   PRN at PRN  .  torsemide (DEMADEX) 20 MG tablet Take 2 tablets (40 mg total) by mouth 2 (two) times daily. 120 tablet 3 12/05/2017 at PM  . warfarin (COUMADIN) 5 MG tablet Take 5 mg by mouth daily.  2 12/05/2017 at AM   Scheduled:  . ALPRAZolam  0.5 mg Oral BID  . amiodarone  400 mg Oral Daily  . carvedilol  12.5 mg Oral BID  . gabapentin  900 mg Oral TID  . [START ON 12/07/2017] glimepiride  2 mg Oral Q breakfast  . insulin detemir  50 Units Subcutaneous BID  . patiromer  8.4 g Oral Once  . torsemide  20 mg Oral BID  . warfarin  7.5 mg Oral ONCE-1800  . Warfarin - Pharmacist Dosing Inpatient   Does not apply q1800    Assessment: Pharmacy consulted to dose and monitor warfarin in this 47 year old male.   1/29: INR: 1.25;  Goal of Therapy:  INR 2-3 Monitor platelets by anticoagulation protocol: Yes   Plan:  Spoke with MD Elisabeth PigeonVachhani and he does not want to bridge patient at this time. Will give warfarin 7.5 mg (50% increase) x 1 dose. INR in am.   Mosie Angus D 12/06/2017,5:49 PM

## 2017-12-06 NOTE — ED Triage Notes (Signed)
Arrives today with c/o SOB and low home oxygen saturations at home.  Patient recently admitted to the hosptial for CHF and is using home O2 - 2L/ [place].  Wife reports patient's oxygen saturations drop into the 70's at times and patient becomes confused.  Also c/o intermittent chest pains.  Symptoms x 3 days.  Arrives to ED on RA, patient did not bring home oxygen.

## 2017-12-06 NOTE — Progress Notes (Addendum)
Called by nursing about patient glucose level, significantly elevated with no correctional insulin coverage.  Coverage ordered.  Subsequently called by pharmacy about patient's INR.  Pt has mechanical valve and INR is 1.25.  Unclear plan, no H&P in chart yet.  Pt admitted presumptively for CHF based on ED provider's note...does have some mild vascular congestion on CXR, though no frank edema and BNP only 64.  He presented SOB and is tachycardic.  Renal function prohibitive for CTA chest, though PE is on differential here.  Will bridge with full anticoagulation with heparin given mechanical valve with suboptimal INR and concern for possible PE.  Will order VQ scan.  Kristeen MissWILLIS, Cali Cuartas FIELDING [facility] 12/06/2017, 9:44 PM

## 2017-12-06 NOTE — Progress Notes (Deleted)
Patient ID: Alexander Alexander, male    DOB: 1970-12-18, 47 y.o.   MRN: [id number]  HPI  Mr Alexander Alexander is a 47 y/o male with a history of atrial fibrillation, DM, HTN, CKD, sleep apnea, anxiety, previous tobacco use and chronic heart failure.   Echo report from 11/16/17 showed an EF of 45%.  Admitted 11/16/17 due to HF exacerbation. Initially needed IV diuretics and then transitioned to oral diuretics. Cardiology consult obtained. Discharged the following day.   He presents today for a follow-up visit with a chief complaint of    Past Medical History:  Diagnosis Date  . Anxiety   . Arrhythmia   . Atrial fibrillation (HCC)   . CHF (congestive heart failure) (HCC)   . Diabetes mellitus without complication (HCC)   . Hypertension   . Renal disorder   . Sleep apnea    Past Surgical History:  Procedure Laterality Date  . ABLATION    . CARDIAC VALVE REPLACEMENT    . VALVE REPLACEMENT     Family History  Problem Relation Age of Onset  . CAD Mother    Social History   Tobacco Use  . Smoking status: Former Smoker    Years: 15.00    Types: Cigarettes    Last attempt to quit: 04/25/2014    Years since quitting: 3.6  . Smokeless tobacco: Never Used  . Tobacco comment: quit around june 2015  Substance Use Topics  . Alcohol use: No    Frequency: Never   No Known Allergies   Review of Systems  Constitutional: Positive for fatigue. Negative for appetite change.  HENT: Negative for congestion, postnasal drip and sore throat.   Eyes: Negative.   Respiratory: Positive for shortness of breath. Negative for cough and chest tightness.   Cardiovascular: Negative for chest pain and leg swelling.  Gastrointestinal: Positive for abdominal distention. Negative for abdominal pain.  Endocrine: Negative.   Genitourinary: Negative.   Musculoskeletal: Positive for arthralgias (feet pain).  Skin: Negative.   Allergic/Immunologic: Negative.   Neurological: Positive for dizziness and numbness (in feet).  Negative for light-headedness.  Hematological: Negative for adenopathy. Bruises/bleeds easily.  Psychiatric/Behavioral: Positive for sleep disturbance (not wearing CPAP). Negative for dysphoric mood. The patient is not nervous/anxious.      Physical Exam  Constitutional: He is oriented to person, place, and time. He appears well-developed and well-nourished.  HENT:  Head: Normocephalic and atraumatic.  Neck: Normal range of motion. Neck supple. JVD present.  Cardiovascular: Normal rate. An irregular rhythm present.  Pulmonary/Chest: Effort normal. He has no wheezes. He has no rales.  Abdominal: He exhibits distension. There is no tenderness.  Musculoskeletal: He exhibits no edema or tenderness.  Neurological: He is alert and oriented to person, place, and time.  Skin: Skin is warm and dry.  Psychiatric: He has a normal mood and affect. His behavior is normal. Thought content normal.  Nursing note and vitals reviewed.   Assessment & Plan:  1: Chronic heart failure with preserved ejection fraction- - NYHA class III - moderately fluid overloaded today - weighing daily and has noticed a gradual weight gain. Instructed to call for an overnight weight gain of >2 pounds or a weekly weight gain of >5 pounds - weight up 13 pounds since hospital discharge 8 days ago - will change his diuretic to torsemide 40mg  BID and stop the furosemide - not adding salt to his food and has been trying to read food labels. Discussed the importance of closely following a  2000mg  sodium diet and written dietary information was given to him about this - drinking ~ 48-64 ounces of fluid daily - patient reports receiving his flu and pneumonia vaccines for this season - [facility] - BNP from 11/16/17 was 117.0 - will plan on checking BMP at his next visit since changing diuretic; currently not taking any potassium supplement  2: HTN- - BP on the low side today - advised to  check it at home every few days - sees PCP Beryle Flock([place]) on 12/09/17 - BMP from 11/17/17 reviewed and showed sodium 141, potassium 4.3 and GFR 49  3: Diabetes- - glucose in clinic was 384 fasting - ate unsweetened applesauce in the middle of the night - [facility] and may also need endocrinology referral  4: Aortic valve replacement-  - INR from 11/17/17 was 2.01  Patient did not bring his medications nor a list. Each medication was verbally reviewed with the patient and he was encouraged to bring the bottles to every visit to confirm accuracy of list.

## 2017-12-06 NOTE — Progress Notes (Signed)
ANTICOAGULATION CONSULT NOTE - Initial Consult  Pharmacy Consult for heparin Indication: atrial fibrillation/mechanical valve  No Known Allergies  Patient Measurements: Height: 5\' 10"  (177.8 cm) Weight: (!) 375 lb 3.2 oz (170.2 kg) IBW/kg (Calculated) : 73 Heparin Dosing Weight: 115 kg  Vital Signs: Temp: 98.4 F (36.9 C) (01/29 1935) Temp Source: Oral (01/29 1935) BP: 104/77 (01/29 1935) Pulse Rate: 125 (01/29 1935)  Labs: Recent Labs    12/06/17 0958  HGB 16.9  HCT 52.8*  PLT 195  APTT 29  LABPROT 15.6*  INR 1.25  CREATININE 2.13*  TROPONINI 0.03*    Estimated Creatinine Clearance: 67.1 mL/min (A) (by C-G formula based on SCr of 2.13 mg/dL (H)).   Medical History: Past Medical History:  Diagnosis Date  . Anxiety   . Arrhythmia   . Atrial fibrillation (HCC)   . CHF (congestive heart failure) (HCC)   . Diabetes mellitus without complication (HCC)   . Hypertension   . Renal disorder   . Sleep apnea     Medications:  Scheduled:  . ALPRAZolam  0.5 mg Oral BID  . amiodarone  400 mg Oral Daily  . carvedilol  12.5 mg Oral BID  . gabapentin  900 mg Oral TID  . heparin  4,000 Units Intravenous Once  . [START ON 12/07/2017] insulin aspart  0-15 Units Subcutaneous TID AC & HS  . insulin detemir  50 Units Subcutaneous BID  . insulin regular  10 Units Intravenous Once  . patiromer  8.4 g Oral Once  . torsemide  20 mg Oral BID  . warfarin  7.5 mg Oral ONCE-1800  . Warfarin - Pharmacist Dosing Inpatient   Does not apply q1800    Assessment: Patient admitted w/ SOB w/ h/o afib and mechanical heart valve. Is anticoagulated w/ warfarin at home. Warfarin PTA 5 mg daily  01/29 INR 1.25 Since patient has a mechanical heart valve w/ h/o afib will bridge w/ heparin  Goal of Therapy:  Heparin level 0.3-0.7 units/ml Monitor platelets by anticoagulation protocol: Yes   Plan:  Will bolus heparin 4000 units IV x 1 Will start heparin drip @ 1300 units/hr Will check  HL @ 0500 Baseline labs drawn WNL Will monitor CBC and adjust per HL's  Thomasene Rippleavid Rockey Guarino, PharmD, BCPS Clinical Pharmacist 12/06/2017

## 2017-12-06 NOTE — H&P (Signed)
[facility] [place] [facility]   PATIENT NAME: Alexander Alexander    MR#:  [id number]  DATE OF BIRTH:  April 15, 1971  DATE OF ADMISSION:  12/06/2017  PRIMARY CARE PHYSICIAN: Erasmo DownerBacigalupo, Angela M, MD   REQUESTING/REFERRING PHYSICIAN: [place]  CHIEF COMPLAINT:   Chief Complaint  Patient presents with  . Shortness of Breath  . Chest Pain    HISTORY OF PRESENT ILLNESS: Alexander Alexander  is a 47 y.o. male with a known history of CHF, A fib, DM, Sleep apnea, valve replacement sx, recent admisison for CHF- came back today as feels very SOB and dizzi. Noted to be hypotensive on arrival to ER, also had some renal failure, tachycardia. He c/o fluid in his abdomen. He is compliant with his meds.  PAST MEDICAL HISTORY:   Past Medical History:  Diagnosis Date  . Anxiety   . Arrhythmia   . Atrial fibrillation (HCC)   . CHF (congestive heart failure) (HCC)   . Diabetes mellitus without complication (HCC)   . Hypertension   . Renal disorder   . Sleep apnea     PAST SURGICAL HISTORY:  Past Surgical History:  Procedure Laterality Date  . ABLATION    . CARDIAC VALVE REPLACEMENT    . VALVE REPLACEMENT      SOCIAL HISTORY:  Social History   Tobacco Use  . Smoking status: Former Smoker    Years: 15.00    Types: Cigarettes    Last attempt to quit: 04/25/2014    Years since quitting: 3.6  . Smokeless tobacco: Never Used  . Tobacco comment: quit around june 2015  Substance Use Topics  . Alcohol use: No    Frequency: Never    FAMILY HISTORY:  Family History  Problem Relation Age of Onset  . CAD Mother     DRUG ALLERGIES: No Known Allergies  REVIEW OF SYSTEMS:   CONSTITUTIONAL: No fever, fatigue or weakness. Have dizziness. EYES: No blurred or double vision.  EARS, NOSE, AND THROAT: No tinnitus or ear pain.  RESPIRATORY: No cough,positive for shortness of breath, no wheezing or hemoptysis.  CARDIOVASCULAR: No chest pain, orthopnea, edema.  GASTROINTESTINAL: No  nausea, vomiting, diarrhea or abdominal pain.  GENITOURINARY: No dysuria, hematuria.  ENDOCRINE: No polyuria, nocturia,  HEMATOLOGY: No anemia, easy bruising or bleeding SKIN: No rash or lesion. MUSCULOSKELETAL: No joint pain or arthritis.   NEUROLOGIC: No tingling, numbness, weakness.  PSYCHIATRY: No anxiety or depression.   MEDICATIONS AT HOME:  Prior to Admission medications   Medication Sig Start Date End Date Taking? Authorizing Provider  acetaminophen (TYLENOL) 325 MG tablet Take 650 mg by mouth every 6 (six) hours as needed.   Yes [provider]  ALPRAZolam Prudy Feeler(XANAX) 0.5 MG tablet Take 1 tablet by mouth 2 (two) times daily. 10/05/17  Yes [provider]  carvedilol (COREG) 25 MG tablet Take 25 mg by mouth 2 (two) times daily.  10/21/17  Yes [provider]  gabapentin (NEURONTIN) 300 MG capsule Take 900 mg by mouth 3 (three) times daily.    Yes [provider]  glimepiride (AMARYL) 2 MG tablet Take 2 mg by mouth daily with breakfast.   Yes [provider]  insulin detemir (LEVEMIR) 100 UNIT/ML injection Inject 50 Units into the skin 2 (two) times daily.   Yes [provider]  losartan (COZAAR) 100 MG tablet Take 100 mg by mouth daily.    Yes [provider]  NOVOLOG 100 UNIT/ML injection Inject 20-25 Units into  the skin 3 (three) times daily.  10/30/17  Yes [provider]  tiZANidine (ZANAFLEX) 4 MG tablet Take 4 mg by mouth every 8 (eight) hours as needed for muscle spasms.   Yes [provider]  torsemide (DEMADEX) 20 MG tablet Take 2 tablets (40 mg total) by mouth 2 (two) times daily. 11/25/17 12/25/17 Yes Clarisa Kindred A, FNP  warfarin (COUMADIN) 5 MG tablet Take 5 mg by mouth daily. 10/04/17  Yes [provider]      PHYSICAL EXAMINATION:   VITAL SIGNS: Blood pressure (!) 160/142, pulse (!) 106, temperature 98.1 F (36.7 C), resp. rate 18, height 5\' 10"  (1.778 m), weight (!) 170.2 kg (375  lb 3.2 oz), SpO2 95 %.  GENERAL:  47 y.o.-year-old patient lying in the bed with no acute distress.  EYES: Pupils equal, round, reactive to light and accommodation. No scleral icterus. Extraocular muscles intact.  HEENT: Head atraumatic, normocephalic. Oropharynx and nasopharynx clear.  NECK:  Supple, no jugular venous distention. No thyroid enlargement, no tenderness.  LUNGS: Normal breath sounds bilaterally, no wheezing, some crepitation. No use of accessory muscles of respiration.  CARDIOVASCULAR: S1, S2 normal. No murmurs, rubs, or gallops.  ABDOMEN: Soft, nontender, nondistended. Bowel sounds present. No organomegaly or mass.  EXTREMITIES: No pedal edema, cyanosis, or clubbing.  NEUROLOGIC: Cranial nerves II through XII are intact. Muscle strength 5/5 in all extremities. Sensation intact. Gait not checked.  PSYCHIATRIC: The patient is alert and oriented x 3.  SKIN: No obvious rash, lesion, or ulcer.   LABORATORY PANEL:   CBC Recent Labs  Lab 12/06/17 0958  WBC 5.7  HGB 16.9  HCT 52.8*  PLT 195  MCV 85.2  MCH 27.2  MCHC 31.9*  RDW 17.6*   ------------------------------------------------------------------------------------------------------------------  Chemistries  Recent Labs  Lab 12/06/17 0958  NA 133*  K 5.3*  CL 90*  CO2 34*  GLUCOSE 451*  BUN 47*  CREATININE 2.13*  CALCIUM 9.4   ------------------------------------------------------------------------------------------------------------------ estimated creatinine clearance is 67.1 mL/min (A) (by C-G formula based on SCr of 2.13 mg/dL (H)). ------------------------------------------------------------------------------------------------------------------ No results for input(s): TSH, T4TOTAL, T3FREE, THYROIDAB in the last 72 hours.  Invalid input(s): FREET3   Coagulation profile Recent Labs  Lab 12/06/17 0958  INR 1.25    ------------------------------------------------------------------------------------------------------------------- No results for input(s): DDIMER in the last 72 hours. -------------------------------------------------------------------------------------------------------------------  Cardiac Enzymes Recent Labs  Lab 12/06/17 0958  TROPONINI 0.03*   ------------------------------------------------------------------------------------------------------------------ Invalid input(s): POCBNP  ---------------------------------------------------------------------------------------------------------------  Urinalysis    Component Value Date/Time   COLORURINE YELLOW (A) 11/16/2017 1057   APPEARANCEUR CLEAR (A) 11/16/2017 1057   LABSPEC 1.030 11/16/2017 1057   PHURINE 5.0 11/16/2017 1057   GLUCOSEU >=500 (A) 11/16/2017 1057   HGBUR NEGATIVE 11/16/2017 1057   BILIRUBINUR NEGATIVE 11/16/2017 1057   KETONESUR NEGATIVE 11/16/2017 1057   PROTEINUR 30 (A) 11/16/2017 1057   NITRITE NEGATIVE 11/16/2017 1057   LEUKOCYTESUR NEGATIVE 11/16/2017 1057     RADIOLOGY: Dg Chest 2 View  Result Date: 12/06/2017 CLINICAL DATA:  Shortness of breath.  Hypoxia. EXAM: CHEST  2 VIEW COMPARISON:  11/16/2017. FINDINGS: Prior median sternotomy and cardiac valve replacement. Cardiomegaly with pulmonary vascular prominence. Similar findings on prior exam. Slight improvement in left base atelectasis. Stable elevation left hemidiaphragm. No pleural effusion or pneumothorax. Degenerative changes thoracic spine. IMPRESSION: 1. Prior median sternotomy and cardiac valve replacement. Stable cardiomegaly and pulmonary venous congestion. No overt pulmonary edema. 2. Mild improvement of left base atelectasis. Persistent elevation left hemidiaphragm. Electronically Signed   By:  Thomas  Register   On: 12/06/2017 10:16    EKG: Orders placed or performed during the hospital encounter of 12/06/17  . EKG 12-Lead  . EKG  12-Lead  . ED EKG within 10 minutes  . ED EKG within 10 minutes  . EKG 12-Lead  . EKG 12-Lead    IMPRESSION AND PLAN:  * hypotension, Tachycardia   May be over diuresis   He was on very high dose of diuretics   I cut down the dose of that.     Monitor closely.  * ac on CKD stage 3   Monitor with low dose of diuretics.  * CH diastolic CHF   No Xray findings of worsening.    He need to be compliant with fluid restriction.   Will cont diuretics. Decrease dose.  * tachycardia   Likely due to dehydration   Cont coreg    On AMio.   Cardio consult.  * Mechanical heart valve   INR is low , will let pharmacy manage Coumadin.    Heparin to bridge the dose.  * DM   Cont glimepiride, levemir, ISS>  All the records are reviewed and case discussed with ED provider. Management plans discussed with the patient, family and they are in agreement.  CODE STATUS: full.    Code Status Orders  (From admission, onward)        Start     Ordered   12/06/17 1514  Full code  Continuous     12/06/17 1513    Code Status History    Date Active Date Inactive Code Status Order ID Comments User Context   11/16/2017 16:37 11/17/2017 18:50 Full Code [id number]  Altamese Dilling, MD Inpatient     His wife was present in room during my visit.  TOTAL TIME TAKING CARE OF THIS PATIENT: 50 minutes.    Altamese Dilling M.D on 12/06/2017   Between 7am to 6pm - Pager - [phone]  After 6pm go to [email or web address] - password [email or web address]  [facility]  Office  [phone]  CC: Primary care physician; Erasmo Downer, MD   Note: This dictation was prepared with Dragon dictation along with smaller phrase technology. Any transcriptional errors that result from this process are unintentional.

## 2017-12-06 NOTE — ED Notes (Signed)
Pt to ed with c/o sob and low sats.  Per pt he is up approx 30 lbs in weight and fluid.  Pt reports he was unable to sleep flat last night.  No edema noted in lower extremities but abd is largely distended.  Pt does not appear to be in resp distress at this time. Able to speak in full complete sentences and sats 98% on 2 lpm [place].  Family at bedside. Pt in a fib on monitor hr 130's, bp low at 87/68

## 2017-12-06 NOTE — ED Notes (Signed)
Patient placed on 2L [place].

## 2017-12-06 NOTE — ED Provider Notes (Addendum)
[facility] Emergency Department Provider Note  ____________________________________________   First MD Initiated Contact with Patient 12/06/17 1013     (approximate)  I have reviewed the triage vital signs and the nursing notes.   HISTORY  Chief Complaint Shortness of Breath and Chest Pain  HPI Alexander Alexander is a 47 y.o. male with a history of atrial fibrillation, CHF, chronic renal insufficiency as well as valve replacement on Coumadin who is presenting to the emergency department today with worsening shortness of breath as well as chest tightness with exertion.  He says that he is on as needed oxygen at home but he is starting to wear the oxygen around-the-clock.  He says that he was supposed to be seen in the heart failure clinic this morning but came to the emergency department instead because of worsening symptoms.  Small amount of chest tightness at this time.  Has not taken his medications yet this morning.  Is taking 80 mg twice daily of torsemide and says that he only urinates 3 times per day.  Says that he feels pain to his abdomen and thinks it is because of fluid.   Past Medical History:  Diagnosis Date  . Anxiety   . Arrhythmia   . Atrial fibrillation (HCC)   . CHF (congestive heart failure) (HCC)   . Diabetes mellitus without complication (HCC)   . Hypertension   . Renal disorder   . Sleep apnea     Patient Active Problem List   Diagnosis Date Noted  . Chronic diastolic heart failure (HCC) 11/25/2017  . HTN (hypertension) 11/25/2017  . Diabetes (HCC) 11/25/2017  . Aortic valve replaced 11/25/2017  . Atrial fibrillation (HCC) 11/25/2017    Past Surgical History:  Procedure Laterality Date  . ABLATION    . CARDIAC VALVE REPLACEMENT    . VALVE REPLACEMENT      Prior to Admission medications   Medication Sig Start Date End Date Taking? Authorizing Provider  acetaminophen (TYLENOL) 325 MG tablet Take 650 mg by mouth every 6 (six)  hours as needed.    [provider]  ALPRAZolam Prudy Feeler) 0.5 MG tablet Take 1 tablet by mouth 2 (two) times daily. 10/05/17   [provider]  carvedilol (COREG) 25 MG tablet Take 25 mg by mouth 2 (two) times daily.  10/21/17   [provider]  gabapentin (NEURONTIN) 300 MG capsule Take 900 mg by mouth 3 (three) times daily.     [provider]  glimepiride (AMARYL) 2 MG tablet Take 2 mg by mouth daily with breakfast.    [provider]  insulin detemir (LEVEMIR) 100 UNIT/ML injection Inject 50 Units into the skin 2 (two) times daily.    [provider]  losartan (COZAAR) 100 MG tablet Take 100 mg by mouth daily.     [provider]  NOVOLOG 100 UNIT/ML injection Inject 20-25 Units into the skin 3 (three) times daily.  10/30/17   [provider]  tiZANidine (ZANAFLEX) 4 MG tablet Take 4 mg by mouth every 8 (eight) hours as needed for muscle spasms.    [provider]  torsemide (DEMADEX) 20 MG tablet Take 2 tablets (40 mg total) by mouth 2 (two) times daily. 11/25/17 12/25/17  Delma Freeze, FNP  warfarin (COUMADIN) 5 MG tablet Take 5 mg by mouth daily. 10/04/17   [provider]    Allergies Patient has no known allergies.  Family History  Problem Relation Age of Onset  .  CAD Mother     Social History Social History   Tobacco Use  . Smoking status: Former Smoker    Years: 15.00    Types: Cigarettes    Last attempt to quit: 04/25/2014    Years since quitting: 3.6  . Smokeless tobacco: Never Used  . Tobacco comment: quit around june 2015  Substance Use Topics  . Alcohol use: No    Frequency: Never  . Drug use: No    Comment: January 28, 2017 quit Cocaine    Review of Systems  Constitutional: No fever/chills Eyes: No visual changes. ENT: No sore throat. Cardiovascular: As above Respiratory: As above Gastrointestinal: No abdominal pain.  No nausea, no vomiting.  No diarrhea.  No  constipation. Genitourinary: Negative for dysuria. Musculoskeletal: Negative for back pain. Skin: Negative for rash. Neurological: Negative for headaches, focal weakness or numbness.   ____________________________________________   PHYSICAL EXAM:  VITAL SIGNS: ED Triage Vitals  Enc Vitals Group     BP 12/06/17 0953 105/85     Pulse Rate 12/06/17 0953 (!) 112     Resp 12/06/17 0953 18     Temp 12/06/17 0953 99.1 F (37.3 C)     Temp src --      SpO2 12/06/17 0953 92 %     Weight 12/06/17 0954 (!) 379 lb (171.9 kg)     Height 12/06/17 0954 5\' 10"  (1.778 m)     Head Circumference --      Peak Flow --      Pain Score 12/06/17 0953 3     Pain Loc --      Pain Edu? --      Excl. in GC? --     Constitutional: Alert and oriented. Well appearing and in no acute distress. Eyes: Conjunctivae are normal.  Head: Atraumatic. Nose: No congestion/rhinnorhea. Mouth/Throat: Mucous membranes are moist.  Neck: No stridor.   Cardiovascular: Normal rate, regular rhythm.  Audible click heard secondary to mechanical heart valve. Respiratory: Wearing nasal cannula oxygen.  No respiratory distress.  Severely reduced breath sounds throughout but this may be secondary to body habitus.  Unable to hear rales.   Gastrointestinal: Soft and nontender. No distention. Musculoskeletal: No lower extremity tenderness nor edema.  No joint effusions. Neurologic:  Normal speech and language. No gross focal neurologic deficits are appreciated. Skin:  Skin is warm, dry and intact. No rash noted. Psychiatric: Mood and affect are normal. Speech and behavior are normal.  ____________________________________________   LABS (all labs ordered are listed, but only abnormal results are displayed)  Labs Reviewed  BASIC METABOLIC PANEL - Abnormal; Notable for the following components:      Result Value   Sodium 133 (*)    Potassium 5.3 (*)    Chloride 90 (*)    CO2 34 (*)    Glucose, Bld 451 (*)    BUN 47 (*)     Creatinine, Ser 2.13 (*)    GFR calc non Af Amer 35 (*)    GFR calc Af Amer 41 (*)    All other components within normal limits  CBC - Abnormal; Notable for the following components:   RBC 6.20 (*)    HCT 52.8 (*)    MCHC 31.9 (*)    RDW 17.6 (*)    All other components within normal limits  TROPONIN I - Abnormal; Notable for the following components:   Troponin I 0.03 (*)    All other components within normal limits  PROTIME-INR -  Abnormal; Notable for the following components:   Prothrombin Time 15.6 (*)    All other components within normal limits  APTT  BRAIN NATRIURETIC PEPTIDE   ____________________________________________  EKG  ED ECG REPORT I, Arelia Longest, the attending physician, personally viewed and interpreted this ECG.   Date: 12/06/2017  EKG Time: 1026  Rate: 132  Rhythm: atrial fibrillation, rate 132  Axis: Normal  Intervals:left anterior fascicular block  ST&T Change: No ST segment elevation or depression.  T wave inversions in aVL as well as V5 and V6. Inversions appear new when compared to an EKG of November 16, 2017.  ED ECG REPORT I, Arelia Longest, the attending physician, personally viewed and interpreted this ECG.   Date: 12/06/2017  EKG Time: 0955  Rate: 119  Rhythm: atrial fibrillation, rate 119  Axis: Left  Intervals:none  ST&T Change: No ST segment elevation or depression. No abnormal t wave inversion.   ____________________________________________  RADIOLOGY  Venous congestion without overt pulmonary edema on chest x-ray ____________________________________________   PROCEDURES  Procedure(s) performed:   .Critical Care Performed by: Myrna Blazer, MD Authorized by: Myrna Blazer, MD   Critical care provider statement:    Critical care time (minutes):  35   Critical care was necessary to treat or prevent imminent or life-threatening deterioration of the following conditions:  Cardiac failure and  circulatory failure   Critical care was time spent personally by me on the following activities:  Ordering and performing treatments and interventions, ordering and review of radiographic studies, ordering and review of laboratory studies and re-evaluation of patient's condition    Critical Care performed:   ____________________________________________   INITIAL IMPRESSION / ASSESSMENT AND PLAN / ED COURSE  Pertinent labs & imaging results that were available during my care of the patient were reviewed by me and considered in my medical decision making (see chart for details).  Differential diagnosis includes, but is not limited to, ACS, aortic dissection, pulmonary embolism, cardiac tamponade, pneumothorax, pneumonia, pericarditis, myocarditis, GI-related causes including esophagitis/gastritis, and musculoskeletal chest wall pain.   Differential includes, but is not limited to, viral syndrome, bronchitis including COPD exacerbation, pneumonia, reactive airway disease including asthma, CHF including exacerbation with or without pulmonary/interstitial edema, pneumothorax, ACS, thoracic trauma, and pulmonary embolism. As part of my medical decision making, I reviewed the following data within the electronic MEDICAL RECORD NUMBER Old chart reviewed and Notes from prior ED visits  ----------------------------------------- 10:59 AM on 12/06/2017 -----------------------------------------  Patient in rapid atrial fibrillation with hypotension to systolic of 80s and 90s.  Discussed the case with Dr. Welton Flakes cardiology who recommends IV digoxin 0.25 mg as well as p.o. amiodarone.  Patient will require admission to the hospital for stabilization.  Also with worsening renal failure and slight hyperkalemia.  Will add veltasa.      ----------------------------------------- 11:18 AM on 12/06/2017 -----------------------------------------  Signed out to Dr. Elisabeth Pigeon.  Patient and family aware of need for  admission to the hospital.  ____________________________________________   FINAL CLINICAL IMPRESSION(S) / ED DIAGNOSES  CHF.  Atrial fibrillation.  Hypertension.  Acute on chronic renal failure.  Hyperkalemia.    NEW MEDICATIONS STARTED DURING THIS VISIT:  New Prescriptions   No medications on file     Note:  This document was prepared using Dragon voice recognition software and may include unintentional dictation errors.     Myrna Blazer, MD 12/06/17 1118    Schaevitz, Myra Rude, MD 12/06/17 1120

## 2017-12-06 NOTE — ED Triage Notes (Signed)
FN: pt presents with chest pain and shortness of breath. Pt brought in wheelchair from POV. No resp distress noted.

## 2017-12-07 ENCOUNTER — Encounter: Payer: Self-pay | Admitting: Radiology

## 2017-12-07 ENCOUNTER — Inpatient Hospital Stay: Payer: Medicare HMO

## 2017-12-07 LAB — GLUCOSE, CAPILLARY
Glucose-Capillary: 477 mg/dL — ABNORMAL HIGH (ref 65–99)
Glucose-Capillary: 359 mg/dL — ABNORMAL HIGH (ref 65–99)
Glucose-Capillary: 374 mg/dL — ABNORMAL HIGH (ref 65–99)
Glucose-Capillary: 367 mg/dL — ABNORMAL HIGH (ref 65–99)
Glucose-Capillary: 435 mg/dL — ABNORMAL HIGH (ref 65–99)

## 2017-12-07 LAB — CBC
HCT: 55 % — ABNORMAL HIGH (ref 40.0–52.0)
Hemoglobin: 17.8 g/dL (ref 13.0–18.0)
MCH: 27.8 pg (ref 26.0–34.0)
MCHC: 32.3 g/dL (ref 32.0–36.0)
MCV: 86 fL (ref 80.0–100.0)
PLATELETS: 179 10*3/uL (ref 150–440)
RBC: 6.4 MIL/uL — ABNORMAL HIGH (ref 4.40–5.90)
RDW: 17.5 % — ABNORMAL HIGH (ref 11.5–14.5)
WBC: 7 10*3/uL (ref 3.8–10.6)

## 2017-12-07 LAB — BASIC METABOLIC PANEL
Anion gap: 11 (ref 5–15)
BUN: 55 mg/dL — ABNORMAL HIGH (ref 6–20)
CALCIUM: 9.3 mg/dL (ref 8.9–10.3)
CO2: 36 mmol/L — AB (ref 22–32)
CREATININE: 2.4 mg/dL — AB (ref 0.61–1.24)
Chloride: 87 mmol/L — ABNORMAL LOW (ref 101–111)
GFR calc Af Amer: 35 mL/min — ABNORMAL LOW (ref >60)
GFR, EST NON AFRICAN AMERICAN: 30 mL/min — AB (ref >60)
GLUCOSE: 472 mg/dL — AB (ref 65–99)
Potassium: 5.1 mmol/L (ref 3.5–5.1)
Sodium: 134 mmol/L — ABNORMAL LOW (ref 135–145)

## 2017-12-07 LAB — HEPARIN LEVEL (UNFRACTIONATED)
HEPARIN UNFRACTIONATED: 0.15 [IU]/mL — AB (ref 0.30–0.70)
HEPARIN UNFRACTIONATED: 0.42 [IU]/mL (ref 0.30–0.70)
Heparin Unfractionated: 0.18 IU/mL — ABNORMAL LOW (ref 0.30–0.70)
Heparin Unfractionated: 0.38 IU/mL (ref 0.30–0.70)

## 2017-12-07 LAB — PROTIME-INR
INR: 1.21
PROTHROMBIN TIME: 15.2 s (ref 11.4–15.2)

## 2017-12-07 MED ORDER — TECHNETIUM TO 99M ALBUMIN AGGREGATED
4.0000 | Freq: Once | INTRAVENOUS | Status: AC | PRN
  Administered 2017-12-07: 4.358 via INTRAVENOUS

## 2017-12-07 MED ORDER — WARFARIN SODIUM 6 MG PO TABS: 6.0000 mg | ORAL_TABLET | Freq: Every day | ORAL | Status: DC

## 2017-12-07 MED ORDER — TECHNETIUM TC 99M DIETHYLENETRIAME-PENTAACETIC ACID
30.0000 | Freq: Once | INTRAVENOUS | Status: AC | PRN
  Administered 2017-12-07: 32.996 via RESPIRATORY_TRACT

## 2017-12-07 MED ORDER — PREMIER PROTEIN SHAKE
11.0000 [oz_av] | Freq: Two times a day (BID) | ORAL | Status: DC
  Administered 2017-12-07: 11 [oz_av] via ORAL

## 2017-12-07 MED ORDER — WARFARIN SODIUM 5 MG PO TABS: 5.0000 mg | ORAL_TABLET | Freq: Every day | ORAL | Status: DC

## 2017-12-07 MED ORDER — WARFARIN SODIUM 3 MG PO TABS
3.0000 mg | ORAL_TABLET | Freq: Every day | ORAL | Status: DC
  Administered 2017-12-07: 3 mg via ORAL
  Filled 2017-12-07 (×2): qty 1

## 2017-12-07 MED ORDER — HEPARIN BOLUS VIA INFUSION
3450.0000 [IU] | Freq: Once | INTRAVENOUS | Status: AC
  Administered 2017-12-07: 3450 [IU] via INTRAVENOUS
  Filled 2017-12-07: qty 3450

## 2017-12-07 MED ORDER — HEPARIN BOLUS VIA INFUSION
3400.0000 [IU] | Freq: Once | INTRAVENOUS | Status: AC
  Administered 2017-12-07: 3400 [IU] via INTRAVENOUS
  Filled 2017-12-07: qty 3400

## 2017-12-07 NOTE — Progress Notes (Signed)
47 year old male with known hx of CHF, A-fib, DM, Sleep Apnea, aortic valve replacement and recent admission earlier this month for CHF.  Patient was hypotensive and tachycardic on presenting to the ER.  Other active problems:   Acute on Chronic KD Stage III, DM, CHF Diastolic. - BNP 64,  Tachycardic on Amiodarone and Coreg.  Non-therapeutic INR for mechanical heart valve and is currently on Heparin Drip.  Patient was seen by this RN during his last admission on 11/17/2017.  CHF education done at that time.    Patient reports today that he has not [facility].  He reports that appointment has been re-scheduled for next week.  Patient's Primary Cardiologist:  Dr. Welton FlakesKhan Patient is also being [facility].    CHF Review:   Reviewed the 5 steps to Living Better with Heart Failure.  ?? ?*Reviewed importance of and reason behind checking weight daily in the AM, after using the bathroom, but before getting dressed.?Patient has scales and reports that he usually weighs himself.    Reviewed the following information with patient:  *Discussed when to call the Dr= weight gain of >2-3lb overnight of 5lb in a week,  *Discussed yellow zone= call MD: weight gain of >2-3lb overnight of 5lb in a week, increased swelling, increased SOB when lying down, chest discomfort, dizziness, increased fatigue *Red Zone= call 911: struggle to breath, fainting or near fainting, significant chest pain  ? *Reviewed low sodium diet-provided handout of recommended and not recommended foods. ?Reviewed reading labels with patient/family. Discussed fluid intake with patient as well. Patient not currently on a fluid restriction, but advised no more than 8-8 ounces glass of fluids per day.  Patient seen today by Dietitian for education of heart healthy carb modified diet.  Patient informed this RN that talking with the Dietitian was very helpful.    *Instructed patient to take  medications as prescribed for heart failure. Explained briefly why pt is on the medications (either make you feel better, live longer or keep you out of the hospital) and discussed monitoring and side effects.  ? *Discussed Pulmonary Rehab when patient was admitted in January 2019.  Patient is awaiting recommendations by the EP Specialist prior to starting Pulmonary Rehab.  Patient does want to participate in the program.  Patient encouraged to be as active as possible.    *Smoking Cessation?- Never Smoker. ?? *[facility] Failure [facility] - Patient did attend his new patient appointment [facility]/2017 [facility].  [facility] is scheduled on 12/16/2017 at 10:40 a.m.    Again, the "5 Steps to Living Better with Heart Failure" were reviewed. ?  Army Meliaiane Maddisyn Hegwood, RN, BSN, [facility] Cardiovascular and Pulmonary Nurse Navigator

## 2017-12-07 NOTE — Progress Notes (Signed)
Inpatient Diabetes Program Recommendations  AACE/ADA: New Consensus Statement on Inpatient Glycemic Control (2015)  Target Ranges:  Prepandial:   less than 140 mg/dL      Peak postprandial:   less than 180 mg/dL (1-2 hours)      Critically ill patients:  140 - 180 mg/dL   Results for Delia HeadyWILSON, Kaelem (MRN [id number]) as of 12/07/2017 16:48  Ref. Range 12/06/2017 20:36 12/07/2017 03:04 12/07/2017 07:19 12/07/2017 13:20  Glucose-Capillary Latest Ref Range: 65 - 99 mg/dL 409567 (HH)  50 units Levemir +   10 units Regular given at 10pm 477 (H) 359 (H)  15 units Novolog 374 (H)  15 units Novolog +   50 units Levemir    Home DM Meds: Levemir 50 BID        Novolog 20-25 units TID        Amaryl 2 mg daily  Current Orders: Levemir 50 units BID      Novolog Moderate Correction Scale/ SSI (0-15 units) TID AC + HS        MD- Patient takes large doses of Novolog at home with meals.  May consider starting Novolog Meal Coverage:    Novolog 12 units TID with meals (hold if pt eats <50% of meal)  This would be about 50% total home dose of Novolog     --Will follow patient during hospitalization--  Ambrose FinlandJeannine Johnston Jarmaine Ehrler RN, MSN, CDE Diabetes Coordinator Inpatient Glycemic Control Team Team Pager: [phone] (8a-5p)

## 2017-12-07 NOTE — Progress Notes (Signed)
ANTICOAGULATION CONSULT NOTE - Initial Consult  Pharmacy Consult for heparin Indication: atrial fibrillation/mechanical valve  No Known Allergies  Patient Measurements: Height: 5\' 10"  (177.8 cm) Weight: (!) 369 lb 12.8 oz (167.7 kg) IBW/kg (Calculated) : 73 Heparin Dosing Weight: 115 kg  Vital Signs: Temp: 98.8 F (37.1 C) (01/30 0501) Temp Source: Oral (01/30 0501) BP: 135/43 (01/30 0504) Pulse Rate: 86 (01/30 0504)  Labs: Recent Labs    12/06/17 0958 12/07/17 0303  HGB 16.9 17.8  HCT 52.8* 55.0*  PLT 195 179  APTT 29  --   LABPROT 15.6* 15.2  INR 1.25 1.21  HEPARINUNFRC  --  0.18*  CREATININE 2.13* 2.40*  TROPONINI 0.03*  --     Estimated Creatinine Clearance: 59 mL/min (A) (by C-G formula based on SCr of 2.4 mg/dL (H)).   Medical History: Past Medical History:  Diagnosis Date  . Anxiety   . Arrhythmia   . Atrial fibrillation (HCC)   . CHF (congestive heart failure) (HCC)   . Diabetes mellitus without complication (HCC)   . Hypertension   . Renal disorder   . Sleep apnea     Medications:  Scheduled:  . ALPRAZolam  0.5 mg Oral BID  . amiodarone  400 mg Oral Daily  . carvedilol  12.5 mg Oral BID  . gabapentin  900 mg Oral TID  . heparin  3,400 Units Intravenous Once  . insulin aspart  0-15 Units Subcutaneous TID AC & HS  . insulin detemir  50 Units Subcutaneous BID  . patiromer  8.4 g Oral Once  . torsemide  20 mg Oral BID  . Warfarin - Pharmacist Dosing Inpatient   Does not apply q1800    Assessment: Patient admitted w/ SOB w/ h/o afib and mechanical heart valve. Is anticoagulated w/ warfarin at home. Warfarin PTA 5 mg daily  01/29 INR 1.25 Since patient has a mechanical heart valve w/ h/o afib will bridge w/ heparin  Goal of Therapy:  Heparin level 0.3-0.7 units/ml Monitor platelets by anticoagulation protocol: Yes   Plan:  Will bolus heparin 4000 units IV x 1 Will start heparin drip @ 1300 units/hr Will check HL @ 0500 Baseline labs  drawn WNL Will monitor CBC and adjust per HL's  01/30 @ 0300 HL 0.18 subtherapeutic and drawn 2 hours early. Will rebolus w/ heparin 3400 units IV x 1 and will increase rate to 1500 units/hr and will recheck HL @ 0900  Thomasene Rippleavid Trever Streater, PharmD, BCPS Clinical Pharmacist 12/07/2017

## 2017-12-07 NOTE — Progress Notes (Signed)
[facility] [place] [facility]   PATIENT NAME: Alexander Alexander    MR#:  [id number]  DATE OF BIRTH:  December 13, 1970  SUBJECTIVE:   Patient admitted with increasing shortness of breath dizziness.  Was found to be in Alexander. Alexander with RVR Feels better today. REVIEW OF SYSTEMS:   Review of Systems  Constitutional: Negative for chills, fever and weight loss.  HENT: Negative for ear discharge, ear pain and nosebleeds.   Eyes: Negative for blurred vision, pain and discharge.  Respiratory: Positive for shortness of breath. Negative for sputum production, wheezing and stridor.   Cardiovascular: Positive for palpitations. Negative for chest pain, orthopnea and PND.  Gastrointestinal: Negative for abdominal pain, diarrhea, nausea and vomiting.  Genitourinary: Negative for frequency and urgency.  Musculoskeletal: Negative for back pain and joint pain.  Neurological: Positive for weakness. Negative for sensory change, speech change and focal weakness.  Psychiatric/Behavioral: Negative for depression and hallucinations. The patient is not nervous/anxious.    Tolerating Diet:yes Tolerating PT: Ambulatory  DRUG ALLERGIES:  No Known Allergies  VITALS:  Blood pressure 130/65, pulse 64, temperature 98.1 F (36.7 C), temperature source Oral, resp. rate 18, height 5\' 10"  (1.778 m), weight (!) 167.7 kg (369 lb 12.8 oz), SpO2 93 %.  PHYSICAL EXAMINATION:   Physical Exam  GENERAL:  47 y.o.-year-old patient lying in the bed with no acute distress.  Morbidly obese EYES: Pupils equal, round, reactive to light and accommodation. No scleral icterus. Extraocular muscles intact.  HEENT: Head atraumatic, normocephalic. Oropharynx and nasopharynx clear.  NECK:  Supple, no jugular venous distention. No thyroid enlargement, no tenderness.  LUNGS: Normal breath sounds bilaterally, no wheezing, rales, rhonchi. No use of accessory muscles of respiration.  CARDIOVASCULAR: S1, S2 normal. No  murmurs, rubs, or gallops.  Irregularly irregular rhythm ABDOMEN: Soft, nontender, nondistended. Bowel sounds present. No organomegaly or mass.  EXTREMITIES: No cyanosis, clubbing or edema b/l.    NEUROLOGIC: Cranial nerves II through XII are intact. No focal Motor or sensory deficits b/l.   PSYCHIATRIC:  patient is alert and oriented x 3.  SKIN: No obvious rash, lesion, or ulcer.   LABORATORY PANEL:  CBC Alexander Labs  Lab 12/07/17 0303  WBC 7.0  HGB 17.8  HCT 55.0*  PLT 179    Chemistries  Alexander Labs  Lab 12/07/17 0303  NA 134*  K 5.1  CL 87*  CO2 36*  GLUCOSE 472*  BUN 55*  CREATININE 2.40*  CALCIUM 9.3   Cardiac Enzymes Alexander Labs  Lab 12/06/17 0958  TROPONINI 0.03*   RADIOLOGY:  Dg Chest 2 View  Result Date: 12/06/2017 CLINICAL DATA:  Shortness of breath.  Hypoxia. EXAM: CHEST  2 VIEW COMPARISON:  11/16/2017. FINDINGS: Prior median sternotomy and cardiac Alexander replacement. Cardiomegaly with pulmonary vascular prominence. Similar findings on prior exam. Slight improvement in left base atelectasis. Stable elevation left hemidiaphragm. No pleural effusion or pneumothorax. Degenerative changes thoracic spine. IMPRESSION: 1. Prior median sternotomy and cardiac Alexander replacement. Stable cardiomegaly and pulmonary venous congestion. No overt pulmonary edema. 2. Mild improvement of left base atelectasis. Persistent elevation left hemidiaphragm. Electronically Signed   By: Maisie Fus  Register   On: 12/06/2017 10:16   Nm Pulmonary Perf And Vent  Result Date: 12/07/2017 CLINICAL DATA:  Shortness of breath since aneurysm surgery in 2015, high pretest probability of pulmonary embolism, history hypertension, atrial fibrillation, diabetes mellitus, former smoker EXAM: NUCLEAR MEDICINE VENTILATION - PERFUSION LUNG SCAN TECHNIQUE: Ventilation images were obtained in multiple projections using  inhaled aerosol Tc-503m DTPA. Perfusion images were obtained in multiple projections after  intravenous injection of Tc-43m MAA. RADIOPHARMACEUTICALS:  32.996 mCi of Tc-463m DTPA aerosol inhalation and 4.358 mCi Tc623m MAA-IV COMPARISON:  None; correlation chest radiograph 12/06/2017 FINDINGS: Ventilation: Single small subsegmental ventilation defect LEFT lower lobe. Enlargement of cardiac silhouette. Low lung volumes. No other ventilation defects. Perfusion: Enlargement of cardiac silhouette. Low lung volumes. No focal perfusion defects. Chest radiograph: Enlargement of cardiac silhouette with pulmonary vascular congestion and LEFT basilar atelectasis IMPRESSION: Very low probability for pulmonary embolism. Electronically Signed   By: Ulyses SouthwardMark  Boles M.D.   On: 12/07/2017 12:32   ASSESSMENT AND PLAN:   Alexander Alexander  is Alexander 47 y.o. male with Alexander known history of Alexander, Alexander Alexander, Alexander Alexander, Alexander Alexander, Alexander Alexander, Alexander admisison for Alexander- came back today as feels very SOB and dizzi. Noted to be hypotensive on arrival to ER, also had some renal failure, tachycardia  1.  Acute on chronic systolic congestive heart failure -Continue oral torsemide -Urine output 1100 cc -Patient is at baseline with sats more than 92% on 2 L nasal cannula oxygen which he uses chronically -Cardiology input noted  2.  Acute on chronic Alexander. Alexander with RVR Patient currently on heparin drip for-subtherapeutic INR -Started on p.o. amiodarone by cardiology  3.  History of mechanical aortic Alexander on oral anticoagulation with warfarin -Bridging with IV heparin drip since INR subtherapeutic -Pharmacy consultation placed  4.  History of Alexander Alexander on CPAP at home  5.  Diabetes uncontrolled -Continue Levemir and sliding scale insulin   Case discussed with Care Management/Social Worker. Management plans discussed with the patient, family and they are in agreement.  CODE STATUS: Full  DVT Prophylaxis: Heparin and warfarin  TOTAL TIME TAKING CARE OF THIS PATIENT: *25* minutes.  >50% time spent on counselling and coordination  of care  POSSIBLE D/C IN 1-2 DAYS, DEPENDING ON CLINICAL CONDITION.  Note: This dictation was prepared with Dragon dictation along with smaller phrase technology. Any transcriptional errors that result from this process are unintentional.  Enedina FinnerSona Jeven Topper M.D on 12/07/2017 at 8:15 PM  Between 7am to 6pm - Pager - [phone]  After 6pm go to [email or web address] - password [email or web address] [email or web address]  [facility]  Office  [phone]  CC: Primary care physician; Erasmo DownerBacigalupo, Angela M, MDPatient ID: Alexander Alexander, male   DOB: 12-27-1970, 47 y.o.   MRN: [id number]

## 2017-12-07 NOTE — Progress Notes (Signed)
Nutrition Education Note  RD consulted for nutrition education regarding a Heart Healthy/Diabetic diet.   47 y.o. male with a known history of CHF, A fib, DM, Sleep apnea, valve replacement sx, recent admisison for CHF, admitted for CHF.  RD provided "Heart Healthy Nutrition Therapy" handout from the Academy of Nutrition and [employer]etics. Reviewed patient's dietary recall. Provided examples on ways to decrease sodium and fat intake in diet. Discouraged intake of processed foods and use of salt shaker. Encouraged fresh fruits and vegetables as well as whole grain sources of carbohydrates to maximize fiber intake.   RD provided "Nutirtion Therapy with Type II Diabetes" handout from the Academy of Nutrition and Dietetics. Discussed different food groups and their effects on blood sugar, emphasizing carbohydrate-containing foods. Provided list of carbohydrates and recommended serving sizes of common foods.  Discussed importance of controlled and consistent carbohydrate intake throughout the day. Provided examples of ways to balance meals/snacks and encouraged intake of high-fiber, whole grain complex carbohydrates.   Teach back method used.  Expect good compliance.  Body mass index is 53.06 kg/m. Pt is morbidly obese.   Current diet order is HH/CHO, patient is consuming approximately 100% of meals at this time. Labs and medications reviewed.   RD will order Premier Protein BID to help pt meet his estimated protein needs, each supplement provides 160 kcal and 30 grams of protein.   RD will liberalize fat restriction from diet as this limits protein also.  No further nutrition interventions warranted at this time. RD contact information provided. If additional nutrition issues arise, please re-consult RD.  Betsey Holidayasey Reginald Mangels MS, RD, LDN Pager #- [phone] After Hours Pager: [phone]

## 2017-12-07 NOTE — Care Management (Addendum)
Patient recently discharged about three weeks ago after hospitalization for heart failure.  Previously was only on nocturnal through an out of state dme company. During that admission qualified for continuous oxygen which was provided by [employer]. Has been seen at [facility]. Has gotten established with [facility]. Verbalizes compliance with meds. Does have scales and "usually weighs."  When presented to ED was on room air.  Did not bring his portable tank to the ED. CM can not prove but wonder if patient is as compliant as he has stated

## 2017-12-07 NOTE — Progress Notes (Signed)
ANTICOAGULATION CONSULT NOTE - Initial Consult  Pharmacy Consult for heparin Indication: atrial fibrillation/mechanical valve  No Known Allergies  Patient Measurements: Height: 5\' 10"  (177.8 cm) Weight: (!) 369 lb 12.8 oz (167.7 kg) IBW/kg (Calculated) : 73 Heparin Dosing Weight: 115 kg  Vital Signs: Temp: 98.8 F (37.1 C) (01/30 0501) Temp Source: Oral (01/30 0501) BP: 138/89 (01/30 0809) Pulse Rate: 109 (01/30 0809)  Labs: Recent Labs    12/06/17 0958 12/07/17 0303 12/07/17 0833 12/07/17 1423  HGB 16.9 17.8  --   --   HCT 52.8* 55.0*  --   --   PLT 195 179  --   --   APTT 29  --   --   --   LABPROT 15.6* 15.2  --   --   INR 1.25 1.21  --   --   HEPARINUNFRC  --  0.18* 0.38 0.15*  CREATININE 2.13* 2.40*  --   --   TROPONINI 0.03*  --   --   --     Estimated Creatinine Clearance: 59 mL/min (A) (by C-G formula based on SCr of 2.4 mg/dL (H)).   Medical History: Past Medical History:  Diagnosis Date  . Anxiety   . Arrhythmia   . Atrial fibrillation (HCC)   . CHF (congestive heart failure) (HCC)   . Diabetes mellitus without complication (HCC)   . Hypertension   . Renal disorder   . Sleep apnea     Medications:  Scheduled:  . ALPRAZolam  0.5 mg Oral BID  . amiodarone  400 mg Oral Daily  . carvedilol  12.5 mg Oral BID  . gabapentin  900 mg Oral TID  . heparin  3,450 Units Intravenous Once  . insulin aspart  0-15 Units Subcutaneous TID AC & HS  . insulin detemir  50 Units Subcutaneous BID  . patiromer  8.4 g Oral Once  . protein supplement shake  11 oz Oral BID BM  . torsemide  20 mg Oral BID  . warfarin  3 mg Oral q1800  . Warfarin - Pharmacist Dosing Inpatient   Does not apply q1800    Assessment: Patient admitted w/ SOB w/ h/o afib and mechanical heart valve. Is anticoagulated w/ warfarin at home. Warfarin PTA 5 mg daily  01/29 INR 1.25 Since patient has a mechanical heart valve w/ h/o afib will bridge w/ heparin  Goal of Therapy:  Heparin  level 0.3-0.7 units/ml Monitor platelets by anticoagulation protocol: Yes INR goal 2-3  Plan:  Heparin level subtherapeutic at 0.15. Per RN drip was not turned off for VQscan and bag was never empty. Bolus 3450 and increase rate to 1850 units/hr. Recheck level in 6 hours  Pt INR is subtherapeutic 1.21. He states that he may have missed a  dose or two in the past week. His INR was therapeutic around 2 last admssion. Pt was started on amiodarone this AM. He received  Per pt, his goal is 2-3. He has an AORTIC valve replacement He received 7.5mg  last night. Due to drug-drug interaction with amiodarone, I will go ahead and prophylaticly decrease the dose to 3mg  daily. Follow up INR in the AM  Darbi Chandran D Glynna Failla, Pharm.D, BCPS Clinical Pharmacist  12/07/2017

## 2017-12-07 NOTE — Progress Notes (Signed)
ANTICOAGULATION CONSULT NOTE - Initial Consult  Pharmacy Consult for heparin Indication: atrial fibrillation/mechanical valve  No Known Allergies  Patient Measurements: Height: 5\' 10"  (177.8 cm) Weight: (!) 369 lb 12.8 oz (167.7 kg) IBW/kg (Calculated) : 73 Heparin Dosing Weight: 115 kg  Vital Signs: Temp: 98.8 F (37.1 C) (01/30 0501) Temp Source: Oral (01/30 0501) BP: 138/89 (01/30 0809) Pulse Rate: 109 (01/30 0809)  Labs: Recent Labs    12/06/17 0958 12/07/17 0303 12/07/17 0833  HGB 16.9 17.8  --   HCT 52.8* 55.0*  --   PLT 195 179  --   APTT 29  --   --   LABPROT 15.6* 15.2  --   INR 1.25 1.21  --   HEPARINUNFRC  --  0.18* 0.38  CREATININE 2.13* 2.40*  --   TROPONINI 0.03*  --   --     Estimated Creatinine Clearance: 59 mL/min (A) (by C-G formula based on SCr of 2.4 mg/dL (H)).   Medical History: Past Medical History:  Diagnosis Date  . Anxiety   . Arrhythmia   . Atrial fibrillation (HCC)   . CHF (congestive heart failure) (HCC)   . Diabetes mellitus without complication (HCC)   . Hypertension   . Renal disorder   . Sleep apnea     Medications:  Scheduled:  . ALPRAZolam  0.5 mg Oral BID  . amiodarone  400 mg Oral Daily  . carvedilol  12.5 mg Oral BID  . gabapentin  900 mg Oral TID  . insulin aspart  0-15 Units Subcutaneous TID AC & HS  . insulin detemir  50 Units Subcutaneous BID  . patiromer  8.4 g Oral Once  . torsemide  20 mg Oral BID  . Warfarin - Pharmacist Dosing Inpatient   Does not apply q1800    Assessment: Patient admitted w/ SOB w/ h/o afib and mechanical heart valve. Is anticoagulated w/ warfarin at home. Warfarin PTA 5 mg daily  01/29 INR 1.25 Since patient has a mechanical heart valve w/ h/o afib will bridge w/ heparin  Goal of Therapy:  Heparin level 0.3-0.7 units/ml Monitor platelets by anticoagulation protocol: Yes INR goal 2-3  Plan:  Heparin level therapeutic at 0.38. Continue current rate of 1500 units/hr Check  confirmation level in 6 hours.  Pt INR is subtherapeutic 1.21. He states that he may have missed a  dose or two in the past week. His INR was therapeutic around 2 last admssion. Pt was started on amiodarone this AM. He received  Per pt, his goal is 2-3. He has an AORTIC valve replacement He received 7.5mg  last night. Due to drug-drug interaction with amiodarone, I will go ahead and prophylaticly decrease the dose to 3mg  daily. Follow up INR in the AM  Melissa D Maccia, Pharm.D, BCPS Clinical Pharmacist  12/07/2017

## 2017-12-07 NOTE — Discharge Instructions (Signed)
[facility] appointment on December 16 2017 at 10:40 with Alexander Kindredina Nakeshia Waldeck, FNP. Please call [phone] to reschedule.

## 2017-12-07 NOTE — Consult Note (Signed)
Alexander Alexander is a 47 y.o. 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 He was noted to have atrial fibrillation with RVR and was started on IV digoxin and  PO amiodarone. Echo done 3 weeks ago showed LVEF 45% with mechanical aortic valve functioning normally.  Pt was supposed to follow up with Dr. Welton Flakes after last hospitalization but he did not come to our office. He was following up with [facility].   Review of Systems: Feeling better, mild shortness of breath, no chest pain. Reports feeling fluid in abdomen.    Past Medical History:  Diagnosis Date  . Anxiety   . Arrhythmia   . Atrial fibrillation (HCC)   . CHF (congestive heart failure) (HCC)   . Diabetes mellitus without complication (HCC)   . Hypertension   . Renal disorder   . Sleep apnea     Medications Prior to Admission  Medication Sig Dispense Refill  . acetaminophen (TYLENOL) 325 MG tablet Take 650 mg by mouth every 6 (six) hours as needed.    . ALPRAZolam (XANAX) 0.5 MG tablet Take 1 tablet by mouth 2 (two) times daily.  1  . carvedilol (COREG) 25 MG tablet Take 25 mg by mouth 2 (two) times daily.   3  . gabapentin (NEURONTIN) 300 MG capsule Take 900 mg by mouth 3 (three) times daily.     Marland Kitchen glimepiride (AMARYL) 2 MG tablet Take 2 mg by mouth daily with breakfast.    . insulin detemir (LEVEMIR) 100 UNIT/ML injection Inject 50 Units into the skin 2 (two) times daily.    Marland Kitchen losartan (COZAAR) 100 MG tablet Take 100 mg by mouth daily.     Marland Kitchen NOVOLOG 100 UNIT/ML injection Inject 20-25 Units into the skin 3 (three) times daily.   2  . tiZANidine (ZANAFLEX) 4 MG tablet Take 4 mg by mouth every 8  (eight) hours as needed for muscle spasms.    Marland Kitchen torsemide (DEMADEX) 20 MG tablet Take 2 tablets (40 mg total) by mouth 2 (two) times daily. 120 tablet 3  . warfarin (COUMADIN) 5 MG tablet Take 5 mg by mouth daily.  2     . ALPRAZolam  0.5 mg Oral BID  . amiodarone  400 mg Oral Daily  . carvedilol  12.5 mg Oral BID  . gabapentin  900 mg Oral TID  . insulin aspart  0-15 Units Subcutaneous TID AC & HS  . insulin detemir  50 Units Subcutaneous BID  . patiromer  8.4 g Oral Once  . torsemide  20 mg Oral BID  . Warfarin - Pharmacist Dosing Inpatient   Does not apply q1800    Infusions: . heparin 1,500 Units/hr (12/07/17 0525)    No Known Allergies  Social History   Socioeconomic History  . Marital status: Married    Spouse name: Not on file  . Number of children: Not on file  . Years of education: Not on file  . Highest education level: Not on file  Social Needs  . Financial resource strain: Not hard at all  . Food insecurity - worry: Never true  . Food insecurity - inability: Never true  . Transportation needs - medical: No  .  Transportation needs - non-medical: No  Occupational History  . Not on file  Tobacco Use  . Smoking status: Former Smoker    Years: 15.00    Types: Cigarettes    Last attempt to quit: 04/25/2014    Years since quitting: 3.6  . Smokeless tobacco: Never Used  . Tobacco comment: quit around june 2015  Substance and Sexual Activity  . Alcohol use: No    Frequency: Never  . Drug use: No    Comment: January 28, 2017 quit Cocaine  . Sexual activity: No  Other Topics Concern  . Not on file  Social History Narrative  . Not on file    Family History  Problem Relation Age of Onset  . CAD Mother     PHYSICAL EXAM: Vitals:   12/07/17 0504 12/07/17 0809  BP: (!) 135/43 138/89  Pulse: 86 (!) 109  Resp:    Temp:    SpO2:  95%     Intake/Output Summary (Last 24 hours) at 12/07/2017 0839 Last data filed at 12/07/2017 0500 Gross per 24 hour   Intake 240 ml  Output 0 ml  Net 240 ml    General:  Appears fatigued, mild shortness of breath HEENT: normal Neck: supple. no JVD. Carotids 2+ bilat; no bruits. No lymphadenopathy or thryomegaly appreciated. Cor: PMI nondisplaced. Regular rate & rhythm. No rubs, gallops or murmurs. Lungs: clear Abdomen: soft, nontender, nondistended. No hepatosplenomegaly. No bruits or masses. Good bowel sounds. Extremities: no cyanosis, clubbing, rash, edema Neuro: alert & oriented x 3, cranial nerves grossly intact. moves all 4 extremities w/o difficulty. Affect pleasant.  ECG: Afib intermittent RVR 95-108bpm  Results for orders placed or performed during the hospital encounter of 12/06/17 (from the past 24 hour(s))  Basic metabolic panel     Status: Abnormal   Collection Time: 12/06/17  9:58 AM  Result Value Ref Range   Sodium 133 (L) 135 - 145 mmol/L   Potassium 5.3 (H) 3.5 - 5.1 mmol/L   Chloride 90 (L) 101 - 111 mmol/L   CO2 34 (H) 22 - 32 mmol/L   Glucose, Bld 451 (H) 65 - 99 mg/dL   BUN 47 (H) 6 - 20 mg/dL   Creatinine, Ser 1.61 (H) 0.61 - 1.24 mg/dL   Calcium 9.4 8.9 - 09.6 mg/dL   GFR calc non Af Amer 35 (L) >60 mL/min   GFR calc Af Amer 41 (L) >60 mL/min   Anion gap 9 5 - 15  CBC     Status: Abnormal   Collection Time: 12/06/17  9:58 AM  Result Value Ref Range   WBC 5.7 3.8 - 10.6 K/uL   RBC 6.20 (H) 4.40 - 5.90 MIL/uL   Hemoglobin 16.9 13.0 - 18.0 g/dL   HCT 04.5 (H) 40.9 - 81.1 %   MCV 85.2 80.0 - 100.0 fL   MCH 27.2 26.0 - 34.0 pg   MCHC 31.9 (L) 32.0 - 36.0 g/dL   RDW 91.4 (H) 78.2 - 95.6 %   Platelets 195 150 - 440 K/uL  Troponin I     Status: Abnormal   Collection Time: 12/06/17  9:58 AM  Result Value Ref Range   Troponin I 0.03 (HH) <0.03 ng/mL  Protime-INR     Status: Abnormal   Collection Time: 12/06/17  9:58 AM  Result Value Ref Range   Prothrombin Time 15.6 (H) 11.4 - 15.2 seconds   INR 1.25   APTT     Status: None   Collection Time:  12/06/17  9:58 AM   Result Value Ref Range   aPTT 29 24 - 36 seconds  Brain natriuretic peptide     Status: None   Collection Time: 12/06/17 10:24 AM  Result Value Ref Range   B Natriuretic Peptide 64.0 0.0 - 100.0 pg/mL  Fibrin derivatives D-Dimer ([facility] only)     Status: Abnormal   Collection Time: 12/06/17  7:24 PM  Result Value Ref Range   Fibrin derivatives D-dimer ([facility]) 666.15 (H) 0.00 - 499.00 ng/mL (FEU)  Glucose, capillary     Status: Abnormal   Collection Time: 12/06/17  8:36 PM  Result Value Ref Range   Glucose-Capillary 567 (HH) 65 - 99 mg/dL   Comment 1 Notify RN    Comment 2 Document in Chart   Basic metabolic panel     Status: Abnormal   Collection Time: 12/07/17  3:03 AM  Result Value Ref Range   Sodium 134 (L) 135 - 145 mmol/L   Potassium 5.1 3.5 - 5.1 mmol/L   Chloride 87 (L) 101 - 111 mmol/L   CO2 36 (H) 22 - 32 mmol/L   Glucose, Bld 472 (H) 65 - 99 mg/dL   BUN 55 (H) 6 - 20 mg/dL   Creatinine, Ser 6.96 (H) 0.61 - 1.24 mg/dL   Calcium 9.3 8.9 - 29.5 mg/dL   GFR calc non Af Amer 30 (L) >60 mL/min   GFR calc Af Amer 35 (L) >60 mL/min   Anion gap 11 5 - 15  CBC     Status: Abnormal   Collection Time: 12/07/17  3:03 AM  Result Value Ref Range   WBC 7.0 3.8 - 10.6 K/uL   RBC 6.40 (H) 4.40 - 5.90 MIL/uL   Hemoglobin 17.8 13.0 - 18.0 g/dL   HCT 28.4 (H) 13.2 - 44.0 %   MCV 86.0 80.0 - 100.0 fL   MCH 27.8 26.0 - 34.0 pg   MCHC 32.3 32.0 - 36.0 g/dL   RDW 10.2 (H) 72.5 - 36.6 %   Platelets 179 150 - 440 K/uL  Protime-INR     Status: None   Collection Time: 12/07/17  3:03 AM  Result Value Ref Range   Prothrombin Time 15.2 11.4 - 15.2 seconds   INR 1.21   Heparin level (unfractionated)     Status: Abnormal   Collection Time: 12/07/17  3:03 AM  Result Value Ref Range   Heparin Unfractionated 0.18 (L) 0.30 - 0.70 IU/mL  Glucose, capillary     Status: Abnormal   Collection Time: 12/07/17  3:04 AM  Result Value Ref Range   Glucose-Capillary 477 (H) 65 - 99 mg/dL   Comment 1  Notify RN    Comment 2 Document in Chart   Glucose, capillary     Status: Abnormal   Collection Time: 12/07/17  7:19 AM  Result Value Ref Range   Glucose-Capillary 359 (H) 65 - 99 mg/dL   Dg Chest 2 View  Result Date: 12/06/2017 CLINICAL DATA:  Shortness of breath.  Hypoxia. EXAM: CHEST  2 VIEW COMPARISON:  11/16/2017. FINDINGS: Prior median sternotomy and cardiac valve replacement. Cardiomegaly with pulmonary vascular prominence. Similar findings on prior exam. Slight improvement in left base atelectasis. Stable elevation left hemidiaphragm. No pleural effusion or pneumothorax. Degenerative changes thoracic spine. IMPRESSION: 1. Prior median sternotomy and cardiac valve replacement. Stable cardiomegaly and pulmonary venous congestion. No overt pulmonary edema. 2. Mild improvement of left base atelectasis. Persistent elevation left hemidiaphragm. Electronically Signed   By: Maisie Fus  Register   On: 12/06/2017 10:16     ASSESSMENT AND PLAN:  Persistent atrial fibrillation with intermittent RVR: Continue amiodarone 400mg . Continue carvedilol. Consider diltiazem if rate is not responding to current dose of amiodarone.   Acute respiratory failure secondary to acute on chronic CHF and acute renal failure: Pt reports improvement of respiratory status. Continue torsemide. Consider aldactone if creatinine/ GFR returns to baseline.    Mechanical aortic valve: INR is subtherapeutic. Advise pharmacy consult.   Will continue to follow.   Caroleen HammanKristin Greenlee Ancheta, NP-C Cell: [phone]

## 2017-12-07 NOTE — Plan of Care (Addendum)
Pt is A&Ox4. VSS . 2L O2 [place] . Afib on monitor. Hep gtt continued per order. Pt went for VQ scan this shift.  OOB with standby assist.  No complaints thus far. Will continue to monitor and report to oncoming RN .  Progressing Education: Knowledge of General Education information will improve 12/07/2017 1505 - Progressing by Jodie EchevariaWhite, Treon Kehl L, RN Health Behavior/Discharge Planning: Ability to manage health-related needs will improve 12/07/2017 1505 - Progressing by Jodie EchevariaWhite, Mohammad Granade L, RN Clinical Measurements: Ability to maintain clinical measurements within normal limits will improve 12/07/2017 1505 - Progressing by Jodie EchevariaWhite, Jovonna Nickell L, RN Will remain free from infection 12/07/2017 1505 - Progressing by Jodie EchevariaWhite, Amauris Debois L, RN Diagnostic test results will improve 12/07/2017 1505 - Progressing by Jodie EchevariaWhite, Jade Burkard L, RN Respiratory complications will improve 12/07/2017 1505 - Progressing by Jodie EchevariaWhite, Maleiah Dula L, RN Cardiovascular complication will be avoided 12/07/2017 1505 - Progressing by Jodie EchevariaWhite, Karalee Hauter L, RN Activity: Risk for activity intolerance will decrease 12/07/2017 1505 - Progressing by Jodie EchevariaWhite, Vittorio Mohs L, RN Nutrition: Adequate nutrition will be maintained 12/07/2017 1505 - Progressing by Jodie EchevariaWhite, Jamey Demchak L, RN Coping: Level of anxiety will decrease 12/07/2017 1505 - Progressing by Jodie EchevariaWhite, Jamine Wingate L, RN Elimination: Will not experience complications related to bowel motility 12/07/2017 1505 - Progressing by Jodie EchevariaWhite, [place] Foerster L, RN Will not experience complications related to urinary retention 12/07/2017 1505 - Progressing by Jodie EchevariaWhite, Jaclynne Baldo L, RN Pain Managment: General experience of comfort will improve 12/07/2017 1505 - Progressing by Jodie EchevariaWhite, Clotine Heiner L, RN Safety: Ability to remain free from injury will improve 12/07/2017 1505 - Progressing by Jodie EchevariaWhite, Rochell Mabie L, RN Skin Integrity: Risk for impaired skin integrity will decrease 12/07/2017 1505 - Progressing by Jodie EchevariaWhite, Shawnee Higham L, RN Education: Knowledge of disease or condition will improve 12/07/2017  1505 - Progressing by Jodie EchevariaWhite, Fahmida Jurich L, RN Understanding of medication regimen will improve 12/07/2017 1505 - Progressing by Jodie EchevariaWhite, Tzipora Mcinroy L, RN Activity: Ability to tolerate increased activity will improve 12/07/2017 1505 - Progressing by Jodie EchevariaWhite, Aldric Wenzler L, RN Cardiac: Ability to achieve and maintain adequate cardiopulmonary perfusion will improve 12/07/2017 1505 - Progressing by Jodie EchevariaWhite, April Carlyon L, RN Health Behavior/Discharge Planning: Ability to safely manage health-related needs after discharge will improve 12/07/2017 1505 - Progressing by Jodie EchevariaWhite, Areona Homer L, RN

## 2017-12-08 LAB — GLUCOSE, CAPILLARY
GLUCOSE-CAPILLARY: 429 mg/dL — AB (ref 65–99)
Glucose-Capillary: 386 mg/dL — ABNORMAL HIGH (ref 65–99)

## 2017-12-08 LAB — CBC
HCT: 54.9 % — ABNORMAL HIGH (ref 40.0–52.0)
HEMOGLOBIN: 18.3 g/dL — AB (ref 13.0–18.0)
MCH: 28.2 pg (ref 26.0–34.0)
MCHC: 33.4 g/dL (ref 32.0–36.0)
MCV: 84.4 fL (ref 80.0–100.0)
PLATELETS: 156 10*3/uL (ref 150–440)
RBC: 6.5 MIL/uL — ABNORMAL HIGH (ref 4.40–5.90)
RDW: 17.2 % — AB (ref 11.5–14.5)
WBC: 6.9 10*3/uL (ref 3.8–10.6)

## 2017-12-08 LAB — PROTIME-INR
INR: 1.24
PROTHROMBIN TIME: 15.5 s — AB (ref 11.4–15.2)

## 2017-12-08 LAB — HEPARIN LEVEL (UNFRACTIONATED): HEPARIN UNFRACTIONATED: 0.34 [IU]/mL (ref 0.30–0.70)

## 2017-12-08 MED ORDER — AMIODARONE HCL 200 MG PO TABS: ORAL_TABLET | ORAL | 1 refills | Status: DC

## 2017-12-08 MED ORDER — ENOXAPARIN SODIUM 150 MG/ML ~~LOC~~ SOLN: 150.0000 mg | Freq: Two times a day (BID) | SUBCUTANEOUS | 0 refills | Status: DC

## 2017-12-08 MED ORDER — AMIODARONE HCL 200 MG PO TABS: 400.0000 mg | ORAL_TABLET | Freq: Two times a day (BID) | ORAL | Status: DC

## 2017-12-08 MED ORDER — WARFARIN SODIUM 3 MG PO TABS: 3.0000 mg | ORAL_TABLET | Freq: Every day | ORAL | 1 refills | Status: DC

## 2017-12-08 MED ORDER — ENOXAPARIN SODIUM 150 MG/ML ~~LOC~~ SOLN
150.0000 mg | Freq: Once | SUBCUTANEOUS | Status: AC
  Administered 2017-12-08: 150 mg via SUBCUTANEOUS
  Filled 2017-12-08: qty 1

## 2017-12-08 NOTE — Progress Notes (Signed)
Provided patient with booklet on warfarin, sharps container, and handout on how to inject lovenox. Reviewed verbally how to inject lovenox. Explained to patient new warfarin dose-3mg  daily. Pt know he needs to get his INR checked on Monday. Pt instructed to take his second lovenox dose tonight around 22-2300 to get back on a easier schedule. All questions answered  Olene FlossMelissa D Letricia Krinsky, Pharm.D, BCPS Clinical Pharmacist

## 2017-12-08 NOTE — Progress Notes (Signed)
SUBJECTIVE: patient is feeling much  Denies any chest pain or shortness of breath   Vitals:   12/07/17 0809 12/07/17 1934 12/08/17 0359 12/08/17 0810  BP: 138/89 130/65 113/80 (!) 127/110  Pulse: (!) 109 64 (!) 44 (!) 103  Resp:  18 18   Temp:  98.1 F (36.7 C)    TempSrc:  Oral    SpO2: 95% 93% 92% 99%  Weight:   (!) 365 lb 9.6 oz (165.8 kg)   Height:        Intake/Output Summary (Last 24 hours) at 12/08/2017 0827 Last data filed at 12/08/2017 0600 Gross per 24 hour  Intake 1069.3 ml  Output 2600 ml  Net -1530.7 ml    LABS: Basic Metabolic Panel: Recent Labs    12/06/17 0958 12/07/17 0303  NA 133* 134*  K 5.3* 5.1  CL 90* 87*  CO2 34* 36*  GLUCOSE 451* 472*  BUN 47* 55*  CREATININE 2.13* 2.40*  CALCIUM 9.4 9.3   Liver Function Tests: No results for input(s): AST, ALT, ALKPHOS, BILITOT, PROT, ALBUMIN in the last 72 hours. No results for input(s): LIPASE, AMYLASE in the last 72 hours. CBC: Recent Labs    12/07/17 0303 12/08/17 0740  WBC 7.0 6.9  HGB 17.8 18.3*  HCT 55.0* 54.9*  MCV 86.0 84.4  PLT 179 156   Cardiac Enzymes: Recent Labs    12/06/17 0958  TROPONINI 0.03*   BNP: Invalid input(s): POCBNP D-Dimer: No results for input(s): DDIMER in the last 72 hours. Hemoglobin A1C: No results for input(s): HGBA1C in the last 72 hours. Fasting Lipid Panel: No results for input(s): CHOL, HDL, LDLCALC, TRIG, CHOLHDL, LDLDIRECT in the last 72 hours. Thyroid Function Tests: No results for input(s): TSH, T4TOTAL, T3FREE, THYROIDAB in the last 72 hours.  Invalid input(s): FREET3 Anemia Panel: No results for input(s): VITAMINB12, FOLATE, FERRITIN, TIBC, IRON, RETICCTPCT in the last 72 hours.   PHYSICAL EXAM General: Well developed, well nourished, in no acute distress HEENT:  Normocephalic and atramatic Neck:  No JVD.  Lungs: Clear bilaterally to auscultation and percussion. Heart: HRRR . Normal S1 and S2 without gallops or murmurs.  Abdomen: Bowel  sounds are positive, abdomen soft and non-tender  Msk:  Back normal, normal gait. Normal strength and tone for age. Extremities: No clubbing, cyanosis or edema.   Neuro: Alert and oriented X 3. Psych:  Good affect, responds appropriately  TELEMETRY: atrial fibrillation with ventricular rate between 90 and 100  ASSESSMENT AND PLAN: atrial fibrillation with rapid ventricular response rate due to noncompliance with medication. After placing the patient on amiodarone and digoxin patient is ing much better and can be discharged with follow-up in the office. Next week Monday at 1 PM.  Principal Problem:   Atrial fibrillation (HCC) Active Problems:   Atrial fibrillation with RVR (HCC)    Alexander Alexander,Alexander Shasteen A, MD, [facility] 12/08/2017 8:27 AM

## 2017-12-08 NOTE — Care Management (Signed)
Patient to discharge home today on lovenox injections to bridge his coumadin.  He has an appointment with his pcp Dr Beryle FlockBacigalupo 12/09/2017 and INR can be checked.  Declines home health.  If patient is admitted again due to these sx, CM will press harder for acceptance as long as patient meets home bound criteria.  It is noted that although he did keep one appointment at the heart failure clinic- he cancelled and rescheduled one but was readmitted before that appointment.  Contacted patient's pharmacy [facility].  Has medication in stock. Copay 100 dollars.  Patient denies this will be a problem

## 2017-12-08 NOTE — Progress Notes (Signed)
ANTICOAGULATION CONSULT NOTE - Follow-up Consult  Pharmacy Consult for heparin/warfarin Indication: atrial fibrillation/mechanical valve  No Known Allergies  Patient Measurements: Height: 5\' 10"  (177.8 cm) Weight: (!) 365 lb 9.6 oz (165.8 kg) IBW/kg (Calculated) : 73 Heparin Dosing Weight: 115 kg  Vital Signs: BP: 127/110 (01/31 0810) Pulse Rate: 103 (01/31 0810)  Labs: Recent Labs    12/06/17 0958 12/07/17 0303  12/07/17 1423 12/07/17 2325 12/08/17 0740  HGB 16.9 17.8  --   --   --  18.3*  HCT 52.8* 55.0*  --   --   --  54.9*  PLT 195 179  --   --   --  156  APTT 29  --   --   --   --   --   LABPROT 15.6* 15.2  --   --   --  15.5*  INR 1.25 1.21  --   --   --  1.24  HEPARINUNFRC  --  0.18*   < > 0.15* 0.42 0.34  CREATININE 2.13* 2.40*  --   --   --   --   TROPONINI 0.03*  --   --   --   --   --    < > = values in this interval not displayed.    Estimated Creatinine Clearance: 58.6 mL/min (A) (by C-G formula based on SCr of 2.4 mg/dL (H)).   Medical History: Past Medical History:  Diagnosis Date  . Anxiety   . Arrhythmia   . Atrial fibrillation (HCC)   . CHF (congestive heart failure) (HCC)   . Diabetes mellitus without complication (HCC)   . Hypertension   . Renal disorder   . Sleep apnea     Medications:  Scheduled:  . ALPRAZolam  0.5 mg Oral BID  . amiodarone  400 mg Oral BID  . carvedilol  12.5 mg Oral BID  . gabapentin  900 mg Oral TID  . insulin aspart  0-15 Units Subcutaneous TID AC & HS  . insulin detemir  50 Units Subcutaneous BID  . patiromer  8.4 g Oral Once  . protein supplement shake  11 oz Oral BID BM  . torsemide  20 mg Oral BID  . warfarin  3 mg Oral q1800  . Warfarin - Pharmacist Dosing Inpatient   Does not apply q1800    Assessment: Patient admitted w/ SOB w/ h/o afib and mechanical heart valve. Is anticoagulated w/ warfarin at home. Warfarin PTA 5 mg daily  01/29 INR 1.25 Since patient has a mechanical heart valve w/ h/o afib  will bridge w/ heparin  01/29   INR  1.25  7.5mg  warfarin 01/30   INR  1.21  3mg  warfarin 01/31   INR  1.24  Goal of Therapy:  Heparin level 0.3-0.7 units/ml Monitor platelets by anticoagulation protocol: Yes INR goal 2-3  Plan:  Heparin level is therapeutic at 0.34. However since HL has dropped from 0.42 and is on the lower end, I will bump rate slightly to 1900 units/hr.  Pt INR is subtherapeutic 1.24. INR on admission was low bc he missed doses. His INR was therapeutic around 2 last admssion. Pt was started on amiodarone this admission. Dose increased to 400mg  BID today. Per pt, his goal is 2-3. He has an AORTIC valve replacement Patient received 7.5mg  at 2200 on 1/29 (less than 36hr ago). Effect of warfarin is delayed 36-72hr. Due to drug-drug interaction with amiodarone (and increased dose today), I will continue with the decreased dose  of 3mg  daily. Follow up INR in the AM If patient is stable for discharge despite subtherapeutic INR, I would recommend d/c him with lovenox injections 1mg /kg BID to bridge patient until INR therapeutic.  Olene Floss, Pharm.D, BCPS Clinical Pharmacist  12/08/2017

## 2017-12-08 NOTE — Discharge Summary (Signed)
[facility] [place] [facility]   PATIENT NAME: Alexander Alexander    MR#:  [id number]  DATE OF BIRTH:  12/28/1970  DATE OF ADMISSION:  12/06/2017 ADMITTING PHYSICIAN: Altamese Dilling, MD  DATE OF DISCHARGE: 12/08/2017  1:30 PM  PRIMARY CARE PHYSICIAN: Erasmo Downer, MD    ADMISSION DIAGNOSIS:  Hyperkalemia [E87.5] Atrial fibrillation with RVR (HCC) [I48.91] Hypotension, unspecified hypotension type [I95.9] Chest pain, unspecified type [R07.9] Acute renal failure superimposed on chronic kidney disease, unspecified CKD stage, unspecified acute renal failure type (HCC) [N17.9, N18.9] Congestive heart failure, unspecified HF chronicity, unspecified heart failure type (HCC) [I50.9]  DISCHARGE DIAGNOSIS:  Principal Problem:   Atrial fibrillation (HCC) Active Problems:   Atrial fibrillation with RVR (HCC)   SECONDARY DIAGNOSIS:   Past Medical History:  Diagnosis Date  . Anxiety   . Arrhythmia   . Atrial fibrillation (HCC)   . CHF (congestive heart failure) (HCC)   . Diabetes mellitus without complication (HCC)   . Hypertension   . Renal disorder   . Sleep apnea     HOSPITAL COURSE:   Alexander Alexander a47 y.o.malewith a known history of CHF, A fib, DM, Sleep apnea, valve replacement sx, recent admisison for CHF- came back today as feels very SOB and dizzi. Noted to be hypotensive on arrival to ER, also had some renal failure, tachycardia  1.  Acute on chronic systolic congestive heart failure -Patient was maintained on his oral torsemide and diuresed well with it.  He will continue his losartan, Coreg and follow-up with cardiology next week.  Clinically he is not symptomatic with any shortness of breath.  2.  Acute on chronic A. fib with RVR -patient's heart rates were initially uncontrolled.  Patient was started on oral amiodarone load.  His heart rates have improved.  He is now being discharged on oral amiodarone, carvedilol. - Patient's INR  is subtherapeutic but he is being discharged on Lovenox and Coumadin and to have his INR checked at his cardiologist's office early next week.  3.  History of mechanical aortic valve on oral anticoagulation with warfarin -Patient's INR was subtherapeutic upon discharge.  While in the hospital he was on a heparin drip.  He is now being discharged on subcutaneous Lovenox and Oral Coumadin.  His goal INR is 2.5 and he will follow up Cardiology for his INR checks.    4.  History of sleep apnea - pt. Will cont. CPAP at home.    5.  Diabetes type II w/out complication - pt. Will cont. Levemir, Novolog with meals.     DISCHARGE CONDITIONS:   Full code  CONSULTS OBTAINED:  Treatment Team:  Laurier Nancy, MD  DRUG ALLERGIES:  No Known Allergies  DISCHARGE MEDICATIONS:   Allergies as of 12/08/2017   No Known Allergies     Medication List    TAKE these medications   acetaminophen 325 MG tablet Commonly known as:  TYLENOL Take 650 mg by mouth every 6 (six) hours as needed.   ALPRAZolam 0.5 MG tablet Commonly known as:  XANAX Take 1 tablet by mouth 2 (two) times daily.   amiodarone 200 MG tablet Commonly known as:  PACERONE 400 mg PO BID X 7 days, 400 mg Daily X 7 days, then 200 mg Daily thereafter.   carvedilol 25 MG tablet Commonly known as:  COREG Take 25 mg by mouth 2 (two) times daily.   enoxaparin 150 MG/ML injection Commonly known as:  LOVENOX Inject 1 mL (150  mg total) into the skin every 12 (twelve) hours for 10 days.   gabapentin 300 MG capsule Commonly known as:  NEURONTIN Take 900 mg by mouth 3 (three) times daily.   glimepiride 2 MG tablet Commonly known as:  AMARYL Take 2 mg by mouth daily with breakfast.   insulin detemir 100 UNIT/ML injection Commonly known as:  LEVEMIR Inject 50 Units into the skin 2 (two) times daily.   losartan 100 MG tablet Commonly known as:  COZAAR Take 100 mg by mouth daily.   NOVOLOG 100 UNIT/ML injection Generic drug:   insulin aspart Inject 20-25 Units into the skin 3 (three) times daily.   tiZANidine 4 MG tablet Commonly known as:  ZANAFLEX Take 4 mg by mouth every 8 (eight) hours as needed for muscle spasms.   torsemide 20 MG tablet Commonly known as:  DEMADEX Take 2 tablets (40 mg total) by mouth 2 (two) times daily.   warfarin 3 MG tablet Commonly known as:  COUMADIN Take 1 tablet (3 mg total) by mouth daily at 6 PM. What changed:    medication strength  how much to take  when to take this         DISCHARGE INSTRUCTIONS:   DIET:  Cardiac diet and Diabetic diet  DISCHARGE CONDITION:  Stable  ACTIVITY:  Activity as tolerated  OXYGEN:  Home Oxygen: No.   Oxygen Delivery: room air  DISCHARGE LOCATION:  home   If you experience worsening of your admission symptoms, develop shortness of breath, life threatening emergency, suicidal or homicidal thoughts you must seek medical attention immediately by calling 911 or calling your MD immediately  if symptoms less severe.  You Must read complete instructions/literature along with all the possible adverse reactions/side effects for all the Medicines you take and that have been prescribed to you. Take any new Medicines after you have completely understood and accpet all the possible adverse reactions/side effects.   Please note  You were cared for by a hospitalist during your hospital stay. If you have any questions about your discharge medications or the care you received while you were in the hospital after you are discharged, you can call the unit and asked to speak with the hospitalist on call if the hospitalist that took care of you is not available. Once you are discharged, your primary care physician will handle any further medical issues. Please note that NO REFILLS for any discharge medications will be authorized once you are discharged, as it is imperative that you return to your primary care physician (or establish a  relationship with a primary care physician if you do not have one) for your aftercare needs so that they can reassess your need for medications and monitor your lab values.     Today   HR stable.  No chest pain, shortness of breath. Will d/c home today.    VITAL SIGNS:  Blood pressure (!) 127/110, pulse (!) 103, temperature 98.1 F (36.7 C), temperature source Oral, resp. rate 18, height 5\' 10"  (1.778 m), weight (!) 165.8 kg (365 lb 9.6 oz), SpO2 99 %.  I/O:    Intake/Output Summary (Last 24 hours) at 12/08/2017 1723 Last data filed at 12/08/2017 1035 Gross per 24 hour  Intake 630 ml  Output 1400 ml  Net -770 ml    PHYSICAL EXAMINATION:  GENERAL:  47 y.o.-year-old obese patient lying in the bed with no acute distress.  EYES: Pupils equal, round, reactive to light and accommodation. No scleral icterus.  Extraocular muscles intact.  HEENT: Head atraumatic, normocephalic. Oropharynx and nasopharynx clear.  NECK:  Supple, no jugular venous distention. No thyroid enlargement, no tenderness.  LUNGS: Normal breath sounds bilaterally, no wheezing, rales,rhonchi. No use of accessory muscles of respiration.  CARDIOVASCULAR: S1, S2 normal. No murmurs, rubs, or gallops.  ABDOMEN: Soft, non-tender, non-distended. Bowel sounds present. No organomegaly or mass.  EXTREMITIES: No pedal edema, cyanosis, or clubbing.  NEUROLOGIC: Cranial nerves II through XII are intact. No focal motor or sensory defecits b/l.  PSYCHIATRIC: The patient is alert and oriented x 3.  SKIN: No obvious rash, lesion, or ulcer.   DATA REVIEW:   CBC Recent Labs  Lab 12/08/17 0740  WBC 6.9  HGB 18.3*  HCT 54.9*  PLT 156    Chemistries  Recent Labs  Lab 12/07/17 0303  NA 134*  K 5.1  CL 87*  CO2 36*  GLUCOSE 472*  BUN 55*  CREATININE 2.40*  CALCIUM 9.3    Cardiac Enzymes Recent Labs  Lab 12/06/17 0958  TROPONINI 0.03*    Microbiology Results  No results found for this or any previous  visit.  RADIOLOGY:  Nm Pulmonary Perf And Vent  Result Date: 12/07/2017 CLINICAL DATA:  Shortness of breath since aneurysm surgery in 2015, high pretest probability of pulmonary embolism, history hypertension, atrial fibrillation, diabetes mellitus, former smoker EXAM: NUCLEAR MEDICINE VENTILATION - PERFUSION LUNG SCAN TECHNIQUE: Ventilation images were obtained in multiple projections using inhaled aerosol Tc-75m DTPA. Perfusion images were obtained in multiple projections after intravenous injection of Tc-56m MAA. RADIOPHARMACEUTICALS:  32.996 mCi of Tc-57m DTPA aerosol inhalation and 4.358 mCi Tc56m MAA-IV COMPARISON:  None; correlation chest radiograph 12/06/2017 FINDINGS: Ventilation: Single small subsegmental ventilation defect LEFT lower lobe. Enlargement of cardiac silhouette. Low lung volumes. No other ventilation defects. Perfusion: Enlargement of cardiac silhouette. Low lung volumes. No focal perfusion defects. Chest radiograph: Enlargement of cardiac silhouette with pulmonary vascular congestion and LEFT basilar atelectasis IMPRESSION: Very low probability for pulmonary embolism. Electronically Signed   By: Ulyses Southward M.D.   On: 12/07/2017 12:32      Management plans discussed with the patient, family and they are in agreement.  CODE STATUS:  Code Status History    Date Active Date Inactive Code Status Order ID Comments User Context   12/06/2017 15:13 12/08/2017 16:35 Full Code [id number]  Altamese Dilling, MD ED    TOTAL TIME TAKING CARE OF THIS PATIENT: 40 minutes.    Houston Siren M.D on 12/08/2017 at 5:23 PM  Between 7am to 6pm - Pager - [phone]  After 6pm go to [email or web address] - Social research officer, government  [facility] [place] [facility]  Office  [phone]  CC: Primary care physician; Erasmo Downer, MD

## 2017-12-08 NOTE — Discharge Planning (Signed)
Discharge instructions reviewed with pt. Prescriptions provided to pt. Pt verbalizes understanding and demonstrated teach back on lovenox injection. Pt ready for discharge home.

## 2017-12-08 NOTE — Progress Notes (Signed)
Inpatient Diabetes Program Recommendations  AACE/ADA: New Consensus Statement on Inpatient Glycemic Control (2015)  Target Ranges:  Prepandial:   less than 140 mg/dL      Peak postprandial:   less than 180 mg/dL (1-2 hours)      Critically ill patients:  140 - 180 mg/dL   Lab Results  Component Value Date   GLUCAP 386 (H) 12/08/2017    Review of Glycemic Control  Results for Alexander Alexander, [name] (MRN [id number]) as of 12/08/2017 10:28  Ref. Range 12/07/2017 07:19 12/07/2017 13:20 12/07/2017 17:06 12/07/2017 21:19 12/08/2017 07:56  Glucose-Capillary Latest Ref Range: 65 - 99 mg/dL 213359 (H) 086374 (H) 578367 (H) 435 (H) 386 (H)   Home DM Meds: Levemir 50 BID                              Novolog 20-25 units TID                              Amaryl 2 mg daily  Current Orders: Levemir 50 units BID                            Novolog Moderate Correction Scale/ SSI (0-15 units) TID AC + HS   May consider starting Novolog Meal Coverage:  Novolog 15 units TID with meals (hold if pt eats <50% of meal)  Consider increasing Levemir to 55 units bid   Per ADA recommendations "consider performing an A1C on all patients with diabetes or hyperglycemia admitted to the hospital if not performed in the prior 3 months".   * Ideally-  Consider transfer to step down to start IV insulin.  Susette RacerJulie Jalesia Loudenslager, RN, BA, MHA, CDE Diabetes Coordinator Inpatient Diabetes Program  [phone] (Team Pager) [phone] [facility] Office) 12/08/2017 10:35 AM

## 2017-12-09 ENCOUNTER — Other Ambulatory Visit: Payer: Self-pay

## 2017-12-09 ENCOUNTER — Encounter: Payer: Self-pay | Admitting: Family Medicine

## 2017-12-09 ENCOUNTER — Ambulatory Visit (INDEPENDENT_AMBULATORY_CARE_PROVIDER_SITE_OTHER): Payer: Medicare HMO | Admitting: Family Medicine

## 2017-12-09 VITALS — BP 128/88 | HR 59 | Temp 98.4°F | Resp 14 | Ht 70.0 in | Wt 369.5 lb

## 2017-12-09 DIAGNOSIS — I482 Chronic atrial fibrillation, unspecified: Secondary | ICD-10-CM

## 2017-12-09 DIAGNOSIS — I5032 Chronic diastolic (congestive) heart failure: Secondary | ICD-10-CM

## 2017-12-09 DIAGNOSIS — Z952 Presence of prosthetic heart valve: Secondary | ICD-10-CM

## 2017-12-09 DIAGNOSIS — F419 Anxiety disorder, unspecified: Secondary | ICD-10-CM

## 2017-12-09 DIAGNOSIS — Z794 Long term (current) use of insulin: Secondary | ICD-10-CM

## 2017-12-09 DIAGNOSIS — N183 Chronic kidney disease, stage 3 unspecified: Secondary | ICD-10-CM | POA: Insufficient documentation

## 2017-12-09 DIAGNOSIS — G4733 Obstructive sleep apnea (adult) (pediatric): Secondary | ICD-10-CM

## 2017-12-09 DIAGNOSIS — E1169 Type 2 diabetes mellitus with other specified complication: Secondary | ICD-10-CM

## 2017-12-09 DIAGNOSIS — I10 Essential (primary) hypertension: Secondary | ICD-10-CM

## 2017-12-09 DIAGNOSIS — E785 Hyperlipidemia, unspecified: Secondary | ICD-10-CM | POA: Diagnosis not present

## 2017-12-09 DIAGNOSIS — E1142 Type 2 diabetes mellitus with diabetic polyneuropathy: Secondary | ICD-10-CM | POA: Diagnosis not present

## 2017-12-09 DIAGNOSIS — E1140 Type 2 diabetes mellitus with diabetic neuropathy, unspecified: Secondary | ICD-10-CM | POA: Insufficient documentation

## 2017-12-09 DIAGNOSIS — E1122 Type 2 diabetes mellitus with diabetic chronic kidney disease: Secondary | ICD-10-CM | POA: Diagnosis not present

## 2017-12-09 DIAGNOSIS — R69 Illness, unspecified: Secondary | ICD-10-CM | POA: Diagnosis not present

## 2017-12-09 MED ORDER — ALPRAZOLAM 0.5 MG PO TABS: 0.5000 mg | ORAL_TABLET | Freq: Two times a day (BID) | ORAL | 1 refills | Status: DC

## 2017-12-09 MED ORDER — GLIMEPIRIDE 2 MG PO TABS: 2.0000 mg | ORAL_TABLET | Freq: Every day | ORAL | 5 refills | Status: DC

## 2017-12-09 NOTE — Assessment & Plan Note (Addendum)
Chronic, currently rate controlled Managed by cardiology Recent hospitalization for RVR Continue beta-blocker, amiodarone, anticoagulation as prescribed Appears that cardiology will be managing INR

## 2017-12-09 NOTE — Progress Notes (Signed)
Patient: Alexander Alexander Male    DOB: [birth year]   47 y.o.   MRN: [id number] Visit Date: 12/09/2017  Today's Provider: Shirlee Latch, MD   Chief Complaint  Patient presents with  . Establish Care   Subjective:    HPI  Patient is here to establish care. Patient moved to [facility] from [place], [place] in October 2018. Patient did have his Flu shot and Shingles shot-patient will work on getting record for [place]. Patient is going to see Dr Welton Flakes Monday and has been seen Clarisa Kindred, NP, cardiology. Patient wanted to discuss where he should get his Warfarin checked here or Dr Milta Deiters office can.  Patient was also hospitalized at the [facility] and dates were 12/06/17-12/08/17.  Diagnoses were: Atrial fib, Acute on chronic CHF, acute renal failure. Medication changes were: added Amiodarone, added Lovenox injections and decreased Warfarin to 3 mg. Last INR was 12/07/2017 and it was 1.21.   OSA: Has CPAP, but does not use.  States that it is uncomfortable.  Could never find a mask that fit well.  Uses O2 instead at 2L. Patient reports HF clinic planning to set up new sleep study  Afib, Mechanical aortic valve replacement: first diagnosed in 2013. He was scheduled to see an EP today, but missed appt for this appt. Had ablation in the past that was unsuccessful.  Was hospitalized for Afib with RVR.  Now rate controlled with amiodarone.  Taking coumadin and Amiodarone per cardiology.  Appears from hospital notes that Cardiology will be following INR.  He is currently on Lovenox bridge as he has mechanical aortic valve and INR was subtherapeutic.  CHF: Followed by Heart Failure clinic.  Last echo in 11/2017 with EF 45%, diffuse hypokinesis, unable to evaluate diastolic dysfunction, Jude aortic valve functioning normally  OA: Also has chronic sciatica in RLE, but occasionally in LLE.  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  Wants to get stronger when he loses weight.  Lives in an apartment with stairs, which  helps him continue using his knees despite pain.  Wants to get healthy for his grandchildren. Taking Tizanidine prn for muscle spasms.  States that Xanax, which she was originally taking for anxiety, helps his sciatica more than anything he has tried in the past.  He does have a history of cocaine abuse, and therefore does not take any narcotics.  Anxiety: Taking Xanax, which he states also helps the sciatica Does not want to get off Xanax.  Does not want to stop due to the pain control that it seems to give.  Did not like psychiatrists in the past as they seemed to want to get him off of the Xanax.  Previous cocaine user.  Quit in 01/2017.  States that he goes to dark places when not taking Xanax.  States he has tried many SSRIs in the past, including at least Celexa and Prozac.  CKD: Chronic problem.  Does not have a nephrologist in the area.  Would see one in [place] before moving.  States that he avoids NSAIDs for this reason.  He thinks that it is due to his diabetes, hypertension.  He knows that having uncontrolled chronic problem such as these will make his kidney disease worse.  He states that he has been on the [place] of dialysis before.  T2DM: States that this has been so uncontrolled lately. Has been unable to afford a GLP1 in the past. Currently taking glimepiride 2mg  daily, Levemir 50 units BID, and  Novolog "about 20" units with each meal, but will take up to 30-40 units when he feels like his blood sugar is too high.  Was previously seeing a podiatrist and would like to see one again.  He states that they did regular foot exams, trimmed his toenails, and also helped with his neuropathy.  He is taking gabapentin for his neuropathy.  HLD: States that he was previously taking a medication for this, but does not remember why it was stopped  No Known Allergies   Current Outpatient Medications:  .  acetaminophen (TYLENOL) 325 MG tablet, Take 650 mg by mouth every 6 (six) hours as needed., Disp: ,  Rfl:  .  ALPRAZolam (XANAX) 0.5 MG tablet, Take 1 tablet (0.5 mg total) by mouth 2 (two) times daily., Disp: 60 tablet, Rfl: 1 .  amiodarone (PACERONE) 200 MG tablet, 400 mg PO BID X 7 days, 400 mg Daily X 7 days, then 200 mg Daily thereafter., Disp: 90 tablet, Rfl: 1 .  carvedilol (COREG) 25 MG tablet, Take 25 mg by mouth 2 (two) times daily. , Disp: , Rfl: 3 .  enoxaparin (LOVENOX) 150 MG/ML injection, Inject 1 mL (150 mg total) into the skin every 12 (twelve) hours for 10 days., Disp: 20 mL, Rfl: 0 .  gabapentin (NEURONTIN) 300 MG capsule, Take 900 mg by mouth 3 (three) times daily. , Disp: , Rfl:  .  glimepiride (AMARYL) 2 MG tablet, Take 1 tablet (2 mg total) by mouth daily with breakfast., Disp: 30 tablet, Rfl: 5 .  insulin detemir (LEVEMIR) 100 UNIT/ML injection, Inject 50 Units into the skin 2 (two) times daily., Disp: , Rfl:  .  losartan (COZAAR) 100 MG tablet, Take 100 mg by mouth daily. , Disp: , Rfl:  .  NOVOLOG 100 UNIT/ML injection, Inject 20-25 Units into the skin 3 (three) times daily. , Disp: , Rfl: 2 .  tiZANidine (ZANAFLEX) 4 MG tablet, Take 4 mg by mouth every 8 (eight) hours as needed for muscle spasms., Disp: , Rfl:  .  torsemide (DEMADEX) 20 MG tablet, Take 2 tablets (40 mg total) by mouth 2 (two) times daily., Disp: 120 tablet, Rfl: 3 .  warfarin (COUMADIN) 3 MG tablet, Take 1 tablet (3 mg total) by mouth daily at 6 PM., Disp: 30 tablet, Rfl: 1  Review of Systems  Constitutional: Positive for unexpected weight change.  HENT: Positive for dental problem, drooling and trouble swallowing.   Eyes: Negative.   Respiratory: Positive for shortness of breath and wheezing.   Gastrointestinal: Positive for abdominal distention.  Endocrine: Negative.   Genitourinary: Negative.   Musculoskeletal: Positive for arthralgias, back pain and gait problem.  Neurological: Positive for dizziness.  Psychiatric/Behavioral: Positive for sleep disturbance. The patient is nervous/anxious.     All other systems reviewed and are negative.   Social History   Tobacco Use  . Smoking status: Former Smoker    Packs/day: 0.50    Years: 15.00    Pack years: 7.50    Types: Cigarettes    Last attempt to quit: 04/25/2014    Years since quitting: 3.6  . Smokeless tobacco: Never Used  . Tobacco comment: quit around june 2015  Substance Use Topics  . Alcohol use: No    Frequency: Never   Objective:   BP 128/88   Pulse (!) 59   Temp 98.4 F (36.9 C)   Resp 14   Ht 5\' 10"  (1.778 m)   Wt (!) 369 lb 8 oz (167.6  kg)   SpO2 95%   BMI 53.02 kg/m    Physical Exam  Constitutional: He is oriented to person, place, and time. He appears well-developed and well-nourished. No distress.  HENT:  Head: Normocephalic and atraumatic.  Right Ear: External ear normal.  Left Ear: External ear normal.  Nose: Nose normal.  Mouth/Throat: Oropharynx is clear and moist. No oropharyngeal exudate.  Eyes: Conjunctivae and EOM are normal. Pupils are equal, round, and reactive to light. Right eye exhibits no discharge. Left eye exhibits no discharge. No scleral icterus.  Neck: Neck supple. No thyromegaly present.  Cardiovascular: Normal rate and intact distal pulses. An irregularly irregular rhythm present.  Murmur heard. Mechanical valve click  Pulmonary/Chest: Effort normal and breath sounds normal. No respiratory distress. He has no wheezes. He has no rales.  Abdominal: Soft. He exhibits no distension. There is no tenderness. There is no rebound.  Musculoskeletal: He exhibits no edema or deformity.  Lymphadenopathy:    He has no cervical adenopathy.  Neurological: He is alert and oriented to person, place, and time. No cranial nerve deficit.  Skin: Skin is warm and dry. No rash noted.  Psychiatric: He has a normal mood and affect. His behavior is normal.  Vitals reviewed.       Assessment & Plan:   Problem List Items Addressed This Visit      Cardiovascular and Mediastinum   HTN  (hypertension) (Chronic)    Currently well controlled Continue losartan and Coreg, which are also for his heart failure Recheck BMP, as his creatinine was elevated from baseline at last check during hospitalization      Relevant Orders   Comprehensive metabolic panel   Atrial fibrillation (HCC) (Chronic)    Chronic, currently rate controlled Managed by cardiology Recent hospitalization for RVR Continue beta-blocker, amiodarone, anticoagulation as prescribed Appears that cardiology will be managing INR      Chronic diastolic heart failure (HCC)    Managed by heart failure team Continue beta-blocker, losartan, loop diuretic Appears euvolemic today        Respiratory   OSA (obstructive sleep apnea)    Chronic and uncontrolled May benefit from repeat sleep study with CPAP titration and mask fitting Patient reports that this is already being set up by the heart failure clinic, so I will not initiate this today        Endocrine   T2DM (type 2 diabetes mellitus) (HCC) - Primary    Chronic and uncontrolled No A1c available for review We will check today Referral to podiatrist for foot checks and management We will request records from previous PCP We will follow-up in 2 weeks to possibly adjust medications and make sure that he is up-to-date on other screenings      Relevant Medications   glimepiride (AMARYL) 2 MG tablet   Other Relevant Orders   Hemoglobin A1c   Ambulatory referral to Podiatry   Diabetic neuropathy (HCC)    Chronic and somewhat controlled Continue gabapentin at current dose Given GFR, watch before adjusting dosage Could consider switch to Lyrica in the future, if patient is able to afford this      Relevant Medications   glimepiride (AMARYL) 2 MG tablet   Hyperlipidemia associated with type 2 diabetes mellitus (HCC)    Check lipid panel today No lipid panel available for review Patient is not currently taking a statin, but given his diabetes,  hypertension, and other cardiac risk factors, he may well benefit from a statin We will follow-up on  this in 2 weeks at his follow-up appointment      Relevant Medications   glimepiride (AMARYL) 2 MG tablet   Other Relevant Orders   Lipid panel     Genitourinary   CKD (chronic kidney disease) stage 3, GFR 30-59 ml/min (HCC)    Chronic On last day of hospitalization, seems to have had a small increase from baseline creatinine Recheck today Referral to nephrology for ongoing follow-up      Relevant Orders   Ambulatory referral to Nephrology   Comprehensive metabolic panel     Other   Aortic valve replaced (Chronic)    Managed by cardiology Anticoagulation as above for mechanical valve and A. fib      Anxiety    Discussed with patient the fact that SSRIs and other daily controller medicines are often more beneficial for anxiety then short acting benzos that can give euphoric feeling and be addictive Advised that I do not prescribe chronic benzos Referral to psychiatry for further management 527-month supply of Xanax given today with anticipation that psychiatry will determine whether this is necessary and they will continue management from there      Relevant Medications   ALPRAZolam (XANAX) 0.5 MG tablet   Other Relevant Orders   Ambulatory referral to Psychiatry      ROI completed for previous PCP We will review records when available  Return in about 2 weeks (around 12/23/2017) for diabetes f/u.   The entirety of the information documented in the History of Present Illness, Review of Systems and Physical Exam were personally obtained by me. Portions of this information were initially documented by AnaHopkins, CMA and reviewed by me for thoroughness and accuracy.    Erasmo DownerBacigalupo, Katalia Choma M, MD, MPH [facility] 12/09/2017 1:22 PM

## 2017-12-09 NOTE — Assessment & Plan Note (Signed)
Chronic On last day of hospitalization, seems to have had a small increase from baseline creatinine Recheck today Referral to nephrology for ongoing follow-up

## 2017-12-09 NOTE — Patient Instructions (Signed)

## 2017-12-09 NOTE — Assessment & Plan Note (Signed)
Check lipid panel today No lipid panel available for review Patient is not currently taking a statin, but given his diabetes, hypertension, and other cardiac risk factors, he may well benefit from a statin We will follow-up on this in 2 weeks at his follow-up appointment

## 2017-12-09 NOTE — Assessment & Plan Note (Signed)
Discussed with patient the fact that SSRIs and other daily controller medicines are often more beneficial for anxiety then short acting benzos that can give euphoric feeling and be addictive Advised that I do not prescribe chronic benzos Referral to psychiatry for further management 9751-month supply of Xanax given today with anticipation that psychiatry will determine whether this is necessary and they will continue management from there

## 2017-12-09 NOTE — Assessment & Plan Note (Signed)
Managed by heart failure team Continue beta-blocker, losartan, loop diuretic Appears euvolemic today

## 2017-12-09 NOTE — Assessment & Plan Note (Signed)
Chronic and uncontrolled No A1c available for review We will check today Referral to podiatrist for foot checks and management We will request records from previous PCP We will follow-up in 2 weeks to possibly adjust medications and make sure that he is up-to-date on other screenings

## 2017-12-09 NOTE — Assessment & Plan Note (Signed)
Currently well controlled Continue losartan and Coreg, which are also for his heart failure Recheck BMP, as his creatinine was elevated from baseline at last check during hospitalization

## 2017-12-09 NOTE — Assessment & Plan Note (Signed)
Chronic and somewhat controlled Continue gabapentin at current dose Given GFR, watch before adjusting dosage Could consider switch to Lyrica in the future, if patient is able to afford this

## 2017-12-09 NOTE — Assessment & Plan Note (Signed)
Chronic and uncontrolled May benefit from repeat sleep study with CPAP titration and mask fitting Patient reports that this is already being set up by the heart failure clinic, so I will not initiate this today

## 2017-12-09 NOTE — Assessment & Plan Note (Signed)
Managed by cardiology Anticoagulation as above for mechanical valve and A. fib

## 2017-12-11 LAB — COMPREHENSIVE METABOLIC PANEL
A/G RATIO: 1 — AB (ref 1.2–2.2)
ALBUMIN: 4.2 g/dL (ref 3.5–5.5)
ALT: 22 IU/L (ref 0–44)
AST: 25 IU/L (ref 0–40)
Alkaline Phosphatase: 79 IU/L (ref 39–117)
BILIRUBIN TOTAL: 1 mg/dL (ref 0.0–1.2)
BUN / CREAT RATIO: 22 — AB (ref 9–20)
BUN: 54 mg/dL — ABNORMAL HIGH (ref 6–24)
CHLORIDE: 85 mmol/L — AB (ref 96–106)
CO2: 28 mmol/L (ref 20–29)
Calcium: 9.7 mg/dL (ref 8.7–10.2)
Creatinine, Ser: 2.43 mg/dL — ABNORMAL HIGH (ref 0.76–1.27)
GFR, EST AFRICAN AMERICAN: 35 mL/min/{1.73_m2} — AB (ref >59)
GFR, EST NON AFRICAN AMERICAN: 30 mL/min/{1.73_m2} — AB (ref >59)
GLOBULIN, TOTAL: 4.2 g/dL (ref 1.5–4.5)
Glucose: 340 mg/dL — ABNORMAL HIGH (ref 65–99)
POTASSIUM: 4.7 mmol/L (ref 3.5–5.2)
Sodium: 135 mmol/L (ref 134–144)
TOTAL PROTEIN: 8.4 g/dL (ref 6.0–8.5)

## 2017-12-11 LAB — LIPID PANEL
CHOL/HDL RATIO: 5.6 ratio — AB (ref 0.0–5.0)
Cholesterol, Total: 175 mg/dL (ref 100–199)
HDL: 31 mg/dL — ABNORMAL LOW (ref >39)
LDL Calculated: 77 mg/dL (ref 0–99)
Triglycerides: 334 mg/dL — ABNORMAL HIGH (ref 0–149)
VLDL Cholesterol Cal: 67 mg/dL — ABNORMAL HIGH (ref 5–40)

## 2017-12-11 LAB — HEMOGLOBIN A1C
ESTIMATED AVERAGE GLUCOSE: 352 mg/dL
HEMOGLOBIN A1C: 13.9 % — AB (ref 4.8–5.6)

## 2017-12-12 ENCOUNTER — Telehealth: Payer: Self-pay

## 2017-12-12 NOTE — Telephone Encounter (Signed)
-----   Message from Malva Limesonald E Fisher, MD sent at 12/12/2017 12:57 PM EST ----- Kidney functions are poor. Need referral to nephrology if he Is not already seeing once.  a1c is terrible at 13.9% be sure to take insulin consistently as prescribed. Will need to discuss adjustments with management at his follow up with Dr. B later this month.

## 2017-12-12 NOTE — Telephone Encounter (Signed)
Left message to call back  

## 2017-12-14 NOTE — Progress Notes (Deleted)
Patient ID: Alexander Alexander, male    DOB: Aug 11, 1971, 47 y.o.   MRN: [id number]  HPI  Alexander Alexander is a 47 y/o male with a history of atrial fibrillation, DM, HTN, CKD, sleep apnea, anxiety, previous tobacco use and chronic heart failure.   Echo report from 11/16/17 showed an EF of 45%.  Admitted 12/06/17 due to HF exacerbation along with AF with RVR. Cardiology consult obtained. Medications were adjusted and he was discharged in 2 days. Admitted 11/16/17 due to HF exacerbation. Initially needed IV diuretics and then transitioned to oral diuretics. Cardiology consult obtained. Discharged the following day.   He presents today for a follow-up visit with a chief complaint of    Past Medical History:  Diagnosis Date  . Allergy   . Anxiety   . Arrhythmia   . Arthritis   . Atrial fibrillation (HCC)   . CHF (congestive heart failure) (HCC)   . CKD (chronic kidney disease)   . Diabetes mellitus without complication (HCC)   . Hyperlipidemia   . Hypertension   . Pancreatitis   . Sleep apnea    Past Surgical History:  Procedure Laterality Date  . ABLATION  08/01/2017  . MECHANICAL AORTIC VALVE REPLACEMENT     Family History  Problem Relation Age of Onset  . CAD Mother   . Hypertension Mother   . Hypertension Father   . Asthma Father   . Dementia Father   . Diabetes Brother   . Hypertension Brother   . Hypertension Brother   . Pancreatic cancer Paternal Aunt        several aunts and uncles with pancreatic cancer on paternal side  . Breast cancer Maternal Aunt   . Colon cancer Neg Hx   . Prostate cancer Neg Hx    Social History   Tobacco Use  . Smoking status: Former Smoker    Packs/day: 0.50    Years: 15.00    Pack years: 7.50    Types: Cigarettes    Last attempt to quit: 04/25/2014    Years since quitting: 3.6  . Smokeless tobacco: Never Used  . Tobacco comment: quit around june 2015  Substance Use Topics  . Alcohol use: No    Frequency: Never   No Known  Allergies   Review of Systems  Constitutional: Positive for fatigue. Negative for appetite change.  HENT: Negative for congestion, postnasal drip and sore throat.   Eyes: Negative.   Respiratory: Positive for shortness of breath. Negative for cough and chest tightness.   Cardiovascular: Negative for chest pain and leg swelling.  Gastrointestinal: Positive for abdominal distention. Negative for abdominal pain.  Endocrine: Negative.   Genitourinary: Negative.   Musculoskeletal: Positive for arthralgias (feet pain).  Skin: Negative.   Allergic/Immunologic: Negative.   Neurological: Positive for dizziness and numbness (in feet). Negative for light-headedness.  Hematological: Negative for adenopathy. Bruises/bleeds easily.  Psychiatric/Behavioral: Positive for sleep disturbance (not wearing CPAP). Negative for dysphoric mood. The patient is not nervous/anxious.      Physical Exam  Constitutional: He is oriented to person, place, and time. He appears well-developed and well-nourished.  HENT:  Head: Normocephalic and atraumatic.  Neck: Normal range of motion. Neck supple. JVD present.  Cardiovascular: Normal rate. An irregular rhythm present.  Pulmonary/Chest: Effort normal. He has no wheezes. He has no rales.  Abdominal: He exhibits distension. There is no tenderness.  Musculoskeletal: He exhibits no edema or tenderness.  Neurological: He is alert and oriented to person, place, and  time.  Skin: Skin is warm and dry.  Psychiatric: He has a normal mood and affect. His behavior is normal. Thought content normal.  Nursing note and vitals reviewed.   Assessment & Plan:  1: Chronic heart failure with preserved ejection fraction- - NYHA class III - moderately fluid overloaded today - weighing daily and has noticed a gradual weight gain. Instructed to call for an overnight weight gain of >2 pounds or a weekly weight gain of >5 pounds - weight  - not adding salt to his food and has been  trying to read food labels. Discussed the importance of closely following a 2000mg  sodium diet and written dietary information was given to him about this - drinking ~ 48-64 ounces of fluid daily - patient reports receiving his flu and pneumonia vaccines for this season - [facility] - BNP from 12/06/17 was 64.0 - will plan on checking BMP at his next visit since changing diuretic; currently not taking any potassium supplement  2: HTN- - BP on the low side today - advised to check it at home every few days - saw PCP Beryle Flock(Alexander Alexander) on 12/09/17 - BMP from 12/09/17 reviewed and showed sodium 135, potassium 4.7 and GFR 35  3: Diabetes- - glucose in clinic was 384 fasting - ate unsweetened applesauce in the middle of the night - [facility] and may also need endocrinology referral  4: Aortic valve replacement-  - INR from 12/08/17 was 1.24  Patient did not bring his medications nor a list. Each medication was verbally reviewed with the patient and he was encouraged to bring the bottles to every visit to confirm accuracy of list.

## 2017-12-16 ENCOUNTER — Telehealth: Payer: Self-pay | Admitting: Family

## 2017-12-16 ENCOUNTER — Ambulatory Visit: Payer: Medicare HMO | Admitting: Family

## 2017-12-16 NOTE — Telephone Encounter (Signed)
LMOVM for pt to return call 

## 2017-12-16 NOTE — Telephone Encounter (Signed)
Patient did not show for his Heart Failure Clinic appointment on 12/16/17. Will attempt to reschedule.  

## 2017-12-19 DIAGNOSIS — J9621 Acute and chronic respiratory failure with hypoxia: Secondary | ICD-10-CM | POA: Diagnosis not present

## 2017-12-20 NOTE — Progress Notes (Deleted)
Patient ID: Alexander Alexander, male    DOB: Aug 11, 1971, 47 y.o.   MRN: [id number]  HPI  Alexander Alexander is a 47 y/o male with a history of atrial fibrillation, DM, HTN, CKD, sleep apnea, anxiety, previous tobacco use and chronic heart failure.   Echo report from 11/16/17 showed an EF of 45%.  Admitted 12/06/17 due to HF exacerbation along with AF with RVR. Cardiology consult obtained. Medications were adjusted and he was discharged in 2 days. Admitted 11/16/17 due to HF exacerbation. Initially needed IV diuretics and then transitioned to oral diuretics. Cardiology consult obtained. Discharged the following day.   He presents today for a follow-up visit with a chief complaint of    Past Medical History:  Diagnosis Date  . Allergy   . Anxiety   . Arrhythmia   . Arthritis   . Atrial fibrillation (HCC)   . CHF (congestive heart failure) (HCC)   . CKD (chronic kidney disease)   . Diabetes mellitus without complication (HCC)   . Hyperlipidemia   . Hypertension   . Pancreatitis   . Sleep apnea    Past Surgical History:  Procedure Laterality Date  . ABLATION  08/01/2017  . MECHANICAL AORTIC VALVE REPLACEMENT     Family History  Problem Relation Age of Onset  . CAD Mother   . Hypertension Mother   . Hypertension Father   . Asthma Father   . Dementia Father   . Diabetes Brother   . Hypertension Brother   . Hypertension Brother   . Pancreatic cancer Paternal Aunt        several aunts and uncles with pancreatic cancer on paternal side  . Breast cancer Maternal Aunt   . Colon cancer Neg Hx   . Prostate cancer Neg Hx    Social History   Tobacco Use  . Smoking status: Former Smoker    Packs/day: 0.50    Years: 15.00    Pack years: 7.50    Types: Cigarettes    Last attempt to quit: 04/25/2014    Years since quitting: 3.6  . Smokeless tobacco: Never Used  . Tobacco comment: quit around june 2015  Substance Use Topics  . Alcohol use: No    Frequency: Never   No Known  Allergies   Review of Systems  Constitutional: Positive for fatigue. Negative for appetite change.  HENT: Negative for congestion, postnasal drip and sore throat.   Eyes: Negative.   Respiratory: Positive for shortness of breath. Negative for cough and chest tightness.   Cardiovascular: Negative for chest pain and leg swelling.  Gastrointestinal: Positive for abdominal distention. Negative for abdominal pain.  Endocrine: Negative.   Genitourinary: Negative.   Musculoskeletal: Positive for arthralgias (feet pain).  Skin: Negative.   Allergic/Immunologic: Negative.   Neurological: Positive for dizziness and numbness (in feet). Negative for light-headedness.  Hematological: Negative for adenopathy. Bruises/bleeds easily.  Psychiatric/Behavioral: Positive for sleep disturbance (not wearing CPAP). Negative for dysphoric mood. The patient is not nervous/anxious.      Physical Exam  Constitutional: He is oriented to person, place, and time. He appears well-developed and well-nourished.  HENT:  Head: Normocephalic and atraumatic.  Neck: Normal range of motion. Neck supple. JVD present.  Cardiovascular: Normal rate. An irregular rhythm present.  Pulmonary/Chest: Effort normal. He has no wheezes. He has no rales.  Abdominal: He exhibits distension. There is no tenderness.  Musculoskeletal: He exhibits no edema or tenderness.  Neurological: He is alert and oriented to person, place, and  time.  Skin: Skin is warm and dry.  Psychiatric: He has a normal mood and affect. His behavior is normal. Thought content normal.  Nursing note and vitals reviewed.   Assessment & Plan:  1: Chronic heart failure with preserved ejection fraction- - NYHA class III - moderately fluid overloaded today - weighing daily and has noticed a gradual weight gain. Instructed to call for an overnight weight gain of >2 pounds or a weekly weight gain of >5 pounds - weight  - not adding salt to his food and has been  trying to read food labels. Discussed the importance of closely following a 2000mg  sodium diet and written dietary information was given to him about this - drinking ~ 48-64 ounces of fluid daily - patient reports receiving his flu and pneumonia vaccines for this season - [facility] - BNP from 12/06/17 was 64.0 - will plan on checking BMP at his next visit since changing diuretic; currently not taking any potassium supplement  2: HTN- - BP on the low side today - advised to check it at home every few days - saw PCP Beryle Flock(Bacigalupo) on 12/09/17 - BMP from 12/09/17 reviewed and showed sodium 135, potassium 4.7 and GFR 35  3: Diabetes- - glucose in clinic was 384 fasting - ate unsweetened applesauce in the middle of the night - [facility] and may also need endocrinology referral  4: Aortic valve replacement-  - INR from 12/08/17 was 1.24  Patient did not bring his medications nor a list. Each medication was verbally reviewed with the patient and he was encouraged to bring the bottles to every visit to confirm accuracy of list.

## 2017-12-21 ENCOUNTER — Ambulatory Visit: Payer: Medicare HMO | Admitting: Family

## 2017-12-21 ENCOUNTER — Telehealth: Payer: Self-pay | Admitting: Family

## 2017-12-21 NOTE — Telephone Encounter (Signed)
Patient did not show for his [facility] appointment on 12/21/17. Will attempt to reschedule.  

## 2017-12-21 NOTE — Telephone Encounter (Signed)
Pt advised. He has an appointment with nephrology. He is taking insulin as prescribed, and needs a refill. Checked his BS at home last night, and the meter read "high". He is taking 30-40 units trying to control BS. He had appoitment scheduled for 12/26/2017, and will discuss prolonged NSAID use for pain. Is also taking more than the recommended Gabapentin dose, because it is no longer controlling neuropathy pain. Please advise.

## 2017-12-22 DIAGNOSIS — Z952 Presence of prosthetic heart valve: Secondary | ICD-10-CM | POA: Diagnosis not present

## 2017-12-22 DIAGNOSIS — I4891 Unspecified atrial fibrillation: Secondary | ICD-10-CM | POA: Diagnosis not present

## 2017-12-22 DIAGNOSIS — I5022 Chronic systolic (congestive) heart failure: Secondary | ICD-10-CM | POA: Diagnosis not present

## 2017-12-22 DIAGNOSIS — I5031 Acute diastolic (congestive) heart failure: Secondary | ICD-10-CM | POA: Diagnosis not present

## 2017-12-22 NOTE — Telephone Encounter (Signed)
Kidney function is near patient's baseline, without acute worsening.  He should keep f/u with Nephrology as was referred to last visit.  We will need to discuss cutting back on NSAIDs next week.  Recommend not increasing gabapentin dose as this can accumulate and cause confusion and other problems, especially in people with kidney disease.  For diabetes, can increase Levemir to 60 units twice daily.  Try 30 units of Novolog with meals.  We will f/u next week. Bring BG log.  If he develops N/V, confusion in the setting of very elevated BG, needs to go to the emergency room.  Erasmo DownerBacigalupo, Sharrod Achille M, MD, MPH [facility] 12/22/2017 9:33 AM

## 2017-12-22 NOTE — Telephone Encounter (Signed)
Pt advised and agrees with plan. 

## 2017-12-26 ENCOUNTER — Encounter: Payer: Self-pay | Admitting: Family Medicine

## 2017-12-26 ENCOUNTER — Ambulatory Visit (INDEPENDENT_AMBULATORY_CARE_PROVIDER_SITE_OTHER): Payer: Medicare HMO | Admitting: Family Medicine

## 2017-12-26 VITALS — BP 124/80 | HR 68 | Temp 98.3°F | Resp 16 | Wt 363.0 lb

## 2017-12-26 DIAGNOSIS — E1142 Type 2 diabetes mellitus with diabetic polyneuropathy: Secondary | ICD-10-CM

## 2017-12-26 DIAGNOSIS — Z794 Long term (current) use of insulin: Secondary | ICD-10-CM

## 2017-12-26 DIAGNOSIS — N183 Chronic kidney disease, stage 3 unspecified: Secondary | ICD-10-CM

## 2017-12-26 DIAGNOSIS — E1122 Type 2 diabetes mellitus with diabetic chronic kidney disease: Secondary | ICD-10-CM

## 2017-12-26 MED ORDER — NOVOLOG 100 UNIT/ML ~~LOC~~ SOLN: 35.0000 [IU] | Freq: Three times a day (TID) | SUBCUTANEOUS | 5 refills | Status: DC

## 2017-12-26 MED ORDER — INSULIN DETEMIR 100 UNIT/ML ~~LOC~~ SOLN: 60.0000 [IU] | Freq: Two times a day (BID) | SUBCUTANEOUS | 5 refills | Status: DC

## 2017-12-26 MED ORDER — GABAPENTIN 300 MG PO CAPS: 600.0000 mg | ORAL_CAPSULE | Freq: Two times a day (BID) | ORAL | 3 refills | Status: DC

## 2017-12-26 MED ORDER — AMITRIPTYLINE HCL 25 MG PO TABS: ORAL_TABLET | ORAL | 0 refills | Status: DC

## 2017-12-26 NOTE — Patient Instructions (Addendum)
Continue Levemir 60 units twice daily Increase Novolog to 35 units with each meal If consistently with blood sugars after eating >200, increase Novolog to 40 units with each meal  Decrease gabapentin to 600mg  three times daily x1 week and then to 600mg  twice daily  Start amitriptyline for neuropathic pain and ramp as listed on bottle  Someone from [facility] will call you about getting an eye exam

## 2017-12-26 NOTE — Progress Notes (Signed)
Patient: Alexander Alexander Male    DOB: 10-31-1971   46 y.o.   MRN: [id number] Visit Date: 12/26/2017  Today's Provider: Shirlee Latch, MD   Chief Complaint  Patient presents with  . Diabetes  . Peripheral Neuropathy   Subjective:    Diabetes  He presents for his follow-up (Pt increased Levemir to 60units twice a day and Novolog 30 units with meals. ) diabetic visit. He has type 2 diabetes mellitus. His disease course has been worsening. There are no hypoglycemic associated symptoms. Pertinent negatives for hypoglycemia include no dizziness or headaches. Associated symptoms include foot ulcerations (Pt reports having "Spots" on the top of is feet. ), polydipsia, polyphagia and polyuria. Pertinent negatives for diabetes include no blurred vision, no chest pain, no fatigue, no foot paresthesias, no visual change, no weakness and no weight loss.   Doesn't check BG as often as he knows that he should Wife has T1DM and fixes all of their meals - low carb diet  BGs 300s are post-prandial 1-3 hours after eating - last week these were too high for the meter to read, so this is an improvement  Tried to do sliding scale insulin in the past, but never seemed to "work"  Hoping to get more active now that feeling better from hospitalization.  Has upcoming appt with podiatry, Nephrology Knows that he needs an ophthalmologist  Diabetic neuropathy Tried cymbalta and lyrica in the past - made him so sleepy that he had incontinence while asleep On gabapentin for many years - doesn't feel it is effective anymore  Has never tried amitriptyline or nortriptyline No open ulcers, but does have some rough spots on his feet    No Known Allergies   Current Outpatient Medications:  .  acetaminophen (TYLENOL) 325 MG tablet, Take 650 mg by mouth every 6 (six) hours as needed., Disp: , Rfl:  .  ALPRAZolam (XANAX) 0.5 MG tablet, Take 1 tablet (0.5 mg total) by mouth 2 (two) times daily., Disp: 60  tablet, Rfl: 1 .  gabapentin (NEURONTIN) 300 MG capsule, Take 900 mg by mouth 3 (three) times daily. , Disp: , Rfl:  .  glimepiride (AMARYL) 2 MG tablet, Take 1 tablet (2 mg total) by mouth daily with breakfast., Disp: 30 tablet, Rfl: 5 .  insulin detemir (LEVEMIR) 100 UNIT/ML injection, Inject 60 Units into the skin 2 (two) times daily. , Disp: , Rfl:  .  losartan (COZAAR) 100 MG tablet, Take 100 mg by mouth daily. , Disp: , Rfl:  .  metoprolol (TOPROL-XL) 200 MG 24 hr tablet, Take 200 mg by mouth 2 (two) times daily., Disp: , Rfl: 0 .  NOVOLOG 100 UNIT/ML injection, Inject 30 Units into the skin 3 (three) times daily. , Disp: , Rfl: 2 .  tiZANidine (ZANAFLEX) 4 MG tablet, Take 4 mg by mouth every 8 (eight) hours as needed for muscle spasms., Disp: , Rfl:  .  warfarin (COUMADIN) 3 MG tablet, Take 1 tablet (3 mg total) by mouth daily at 6 PM., Disp: 30 tablet, Rfl: 1 .  amiodarone (PACERONE) 200 MG tablet, 400 mg PO BID X 7 days, 400 mg Daily X 7 days, then 200 mg Daily thereafter. (Patient not taking: Reported on 12/26/2017), Disp: 90 tablet, Rfl: 1 .  carvedilol (COREG) 25 MG tablet, Take 25 mg by mouth 2 (two) times daily. , Disp: , Rfl: 3 .  enoxaparin (LOVENOX) 150 MG/ML injection, Inject 1 mL (150 mg total) into the  skin every 12 (twelve) hours for 10 days., Disp: 20 mL, Rfl: 0 .  torsemide (DEMADEX) 20 MG tablet, Take 2 tablets (40 mg total) by mouth 2 (two) times daily., Disp: 120 tablet, Rfl: 3  Review of Systems  Constitutional: Negative.  Negative for fatigue and weight loss.  Eyes: Negative for blurred vision.  Respiratory: Negative.   Cardiovascular: Negative.  Negative for chest pain.  Gastrointestinal: Positive for nausea (Occasionally). Negative for abdominal distention, abdominal pain, anal bleeding, blood in stool, constipation, diarrhea, rectal pain and vomiting.  Endocrine: Positive for polydipsia, polyphagia and polyuria. Negative for cold intolerance and heat intolerance.        Pt reports this is stable   Neurological: Negative for dizziness, weakness, light-headedness and headaches.    Social History   Tobacco Use  . Smoking status: Former Smoker    Packs/day: 0.50    Years: 15.00    Pack years: 7.50    Types: Cigarettes    Last attempt to quit: 04/25/2014    Years since quitting: 3.6  . Smokeless tobacco: Never Used  . Tobacco comment: quit around june 2015  Substance Use Topics  . Alcohol use: No    Frequency: Never   Objective:   BP 124/80 (BP Location: Left Arm, Patient Position: Sitting, Cuff Size: Large)   Pulse 68   Temp 98.3 F (36.8 C) (Oral)   Resp 16   Wt (!) 363 lb (164.7 kg)   BMI 52.09 kg/m  Vitals:   12/26/17 1101  BP: 124/80  Pulse: 68  Resp: 16  Temp: 98.3 F (36.8 C)  TempSrc: Oral  Weight: (!) 363 lb (164.7 kg)     Physical Exam  Constitutional: He is oriented to person, place, and time. He appears well-developed and well-nourished. No distress.  HENT:  Head: Normocephalic and atraumatic.  Eyes: Conjunctivae are normal. No scleral icterus.  Neck: Neck supple. No thyromegaly present.  Cardiovascular: Normal rate, regular rhythm and intact distal pulses.  Murmur (mechanical click) heard. Pulmonary/Chest: Effort normal and breath sounds normal. No respiratory distress. He has no wheezes. He has no rales.  Abdominal: Soft. He exhibits no distension. There is no tenderness.  Musculoskeletal: He exhibits edema.  Lymphadenopathy:    He has no cervical adenopathy.  Neurological: He is alert and oriented to person, place, and time.  Skin: Skin is warm and dry. No rash noted.  Psychiatric: He has a normal mood and affect. His behavior is normal.  Vitals reviewed.      Assessment & Plan:     Problem List Items Addressed This Visit      Endocrine   T2DM (type 2 diabetes mellitus) (HCC) - Primary    Chronic and uncontrolled with hyperglycemia A1c grossly elevated at last check Has upcoming appointment with  podiatrist Continue Levemir 60 units twice daily Increase Novolog to 35 units with each meal If consistently with blood sugars after eating >200, increase Novolog to 40 units with each meal Continue to check fasting blood glucose and postprandial blood glucose at least once daily Referral to ophthalmology for diabetic eye exam Follow-up in 2 weeks for ongoing titration of insulin      Relevant Medications   NOVOLOG 100 UNIT/ML injection   insulin detemir (LEVEMIR) 100 UNIT/ML injection   Other Relevant Orders   Ambulatory referral to Ophthalmology   Diabetic neuropathy (HCC)    Chronic and uncontrolled Given GFR, patient is taking more than his max dose We will decrease gabapentin to 600  mg twice daily Start amitriptyline at low-dose and slowly increase as able to tolerate      Relevant Medications   NOVOLOG 100 UNIT/ML injection   insulin detemir (LEVEMIR) 100 UNIT/ML injection     Genitourinary   CKD (chronic kidney disease) stage 3, GFR 30-59 ml/min (HCC)    Chronic and stable from hospitalization Recheck today Patient has upcoming appointment with nephrology for further management Given GFR, watch dosing of medications and decrease gabapentin dose as above Avoid NSAIDs and other nephrotoxic medications      Relevant Orders   Basic Metabolic Panel (BMET) (Completed)       Return in about 2 weeks (around 01/09/2018) for Diabetes f/u.   The entirety of the information documented in the History of Present Illness, Review of Systems and Physical Exam were personally obtained by me. Portions of this information were initially documented by Kavin LeechLaura Walsh, CMA and reviewed by me for thoroughness and accuracy.    Erasmo DownerBacigalupo, Angela M, MD, MPH [facility] 12/27/2017 12:03 PM

## 2017-12-27 ENCOUNTER — Telehealth: Payer: Self-pay

## 2017-12-27 LAB — BASIC METABOLIC PANEL
BUN / CREAT RATIO: 19 (ref 9–20)
BUN: 47 mg/dL — AB (ref 6–24)
CO2: 23 mmol/L (ref 20–29)
CREATININE: 2.42 mg/dL — AB (ref 0.76–1.27)
Calcium: 10.1 mg/dL (ref 8.7–10.2)
Chloride: 92 mmol/L — ABNORMAL LOW (ref 96–106)
GFR calc Af Amer: 35 mL/min/{1.73_m2} — ABNORMAL LOW (ref >59)
GFR calc non Af Amer: 30 mL/min/{1.73_m2} — ABNORMAL LOW (ref >59)
Glucose: 371 mg/dL — ABNORMAL HIGH (ref 65–99)
Potassium: 6.5 mmol/L — ABNORMAL HIGH (ref 3.5–5.2)
Sodium: 135 mmol/L (ref 134–144)

## 2017-12-27 NOTE — Assessment & Plan Note (Signed)
Chronic and uncontrolled with hyperglycemia A1c grossly elevated at last check Has upcoming appointment with podiatrist Continue Levemir 60 units twice daily Increase Novolog to 35 units with each meal If consistently with blood sugars after eating >200, increase Novolog to 40 units with each meal Continue to check fasting blood glucose and postprandial blood glucose at least once daily Referral to ophthalmology for diabetic eye exam Follow-up in 2 weeks for ongoing titration of insulin

## 2017-12-27 NOTE — Telephone Encounter (Signed)
-----   Message from Erasmo DownerAngela M Bacigalupo, MD sent at 12/27/2017  8:06 AM EST ----- Kidney function is stable. Potassium elevated, but this is an error because blood was hemolyzed which increases potassium.  We will recheck at next visit.  Erasmo DownerBacigalupo, Angela M, MD, MPH [facility] 12/27/2017 8:06 AM

## 2017-12-27 NOTE — Assessment & Plan Note (Signed)
Chronic and uncontrolled Given GFR, patient is taking more than his max dose We will decrease gabapentin to 600 mg twice daily Start amitriptyline at low-dose and slowly increase as able to tolerate

## 2017-12-27 NOTE — Assessment & Plan Note (Signed)
Chronic and stable from hospitalization Recheck today Patient has upcoming appointment with nephrology for further management Given GFR, watch dosing of medications and decrease gabapentin dose as above Avoid NSAIDs and other nephrotoxic medications

## 2017-12-27 NOTE — Telephone Encounter (Signed)
Pt advised and agrees with results. 

## 2018-01-03 ENCOUNTER — Telehealth: Payer: Self-pay | Admitting: Family Medicine

## 2018-01-03 NOTE — Telephone Encounter (Signed)
Patient is requesting refill on his NOVOLOG 100 UNIT/ML injection     [facility]

## 2018-01-04 NOTE — Telephone Encounter (Signed)
Patient can call pharmacy for refill.  I sent in refill with 5 additional refills on 12/26/17.  Erasmo DownerBacigalupo, Arrianna Catala M, MD, MPH [facility] 01/04/2018 8:44 AM

## 2018-01-04 NOTE — Telephone Encounter (Signed)
Patient advised as below. sd 

## 2018-01-09 ENCOUNTER — Ambulatory Visit: Payer: Medicare HMO | Admitting: Family Medicine

## 2018-01-09 NOTE — Progress Notes (Deleted)
Patient: Alexander RiffleOtis Leon Alexander Male    DOB: [birth year]   46 y.o.   MRN: [id number] Visit Date: 01/09/2018  Today's Provider: Shirlee LatchAngela Bacigalupo, MD   I, Joslyn HyEmily Isis Costanza, CMA, am acting as scribe for Shirlee LatchAngela Bacigalupo, MD.  No chief complaint on file.  Subjective:    HPI      Diabetes Mellitus Type II, Follow-up:   Lab Results  Component Value Date   HGBA1C 13.9 (H) 12/09/2017    Last seen for diabetes 2 weeks ago.  Management since then includes continuing Levemir 60 IU BID, and increasing Novolog to 35 IU with each meal. Pt was advised to increase Novolog to 40 IU with each meal if BS consistently run over 200.  He reports {excellent/good/fair/poor:19665} compliance with treatment. He {ACTION; IS/IS [id number]} having side effects. *** Current symptoms include {Symptoms; diabetes:14075} and have been {Desc; course:15616}. Home blood sugar records: {diabetes glucometry results:16657}  Episodes of hypoglycemia? {yes***/no:17258}   Current Insulin Regimen: *** Most Recent Eye Exam: *** Weight trend: {trend:16658} Prior visit with dietician: {yes/no:17258} Current diet: {diet habits:16563} Current exercise: {exercise types:16438}  Pertinent Labs:    Component Value Date/Time   CHOL 175 12/09/2017 1017   TRIG 334 (H) 12/09/2017 1017   HDL 31 (L) 12/09/2017 1017   LDLCALC 77 12/09/2017 1017   CREATININE 2.42 (H) 12/26/2017 1145    Wt Readings from Last 3 Encounters:  12/26/17 (!) 363 lb (164.7 kg)  12/09/17 (!) 369 lb 8 oz (167.6 kg)  12/08/17 (!) 365 lb 9.6 oz (165.8 kg)    ------------------------------------------------------------------------   No Known Allergies   Current Outpatient Medications:  .  acetaminophen (TYLENOL) 325 MG tablet, Take 650 mg by mouth every 6 (six) hours as needed., Disp: , Rfl:  .  ALPRAZolam (XANAX) 0.5 MG tablet, Take 1 tablet (0.5 mg total) by mouth 2 (two) times daily., Disp: 60 tablet, Rfl: 1 .  amiodarone (PACERONE) 200  MG tablet, 400 mg PO BID X 7 days, 400 mg Daily X 7 days, then 200 mg Daily thereafter. (Patient not taking: Reported on 12/26/2017), Disp: 90 tablet, Rfl: 1 .  amitriptyline (ELAVIL) 25 MG tablet, Take 1 tablet (25 mg total) by mouth at bedtime for 7 days, THEN 2 tablets (50 mg total) at bedtime for 21 days., Disp: 49 tablet, Rfl: 0 .  carvedilol (COREG) 25 MG tablet, Take 25 mg by mouth 2 (two) times daily. , Disp: , Rfl: 3 .  enoxaparin (LOVENOX) 150 MG/ML injection, Inject 1 mL (150 mg total) into the skin every 12 (twelve) hours for 10 days., Disp: 20 mL, Rfl: 0 .  gabapentin (NEURONTIN) 300 MG capsule, Take 2 capsules (600 mg total) by mouth 2 (two) times daily., Disp: 120 capsule, Rfl: 3 .  glimepiride (AMARYL) 2 MG tablet, Take 1 tablet (2 mg total) by mouth daily with breakfast., Disp: 30 tablet, Rfl: 5 .  insulin detemir (LEVEMIR) 100 UNIT/ML injection, Inject 0.6 mLs (60 Units total) into the skin 2 (two) times daily., Disp: 40 mL, Rfl: 5 .  losartan (COZAAR) 100 MG tablet, Take 100 mg by mouth daily. , Disp: , Rfl:  .  metoprolol (TOPROL-XL) 200 MG 24 hr tablet, Take 200 mg by mouth 2 (two) times daily., Disp: , Rfl: 0 .  NOVOLOG 100 UNIT/ML injection, Inject 35 Units into the skin 3 (three) times daily with meals., Disp: 40 mL, Rfl: 5 .  tiZANidine (ZANAFLEX) 4 MG tablet, Take 4 mg by mouth  every 8 (eight) hours as needed for muscle spasms., Disp: , Rfl:  .  torsemide (DEMADEX) 20 MG tablet, Take 2 tablets (40 mg total) by mouth 2 (two) times daily., Disp: 120 tablet, Rfl: 3 .  warfarin (COUMADIN) 3 MG tablet, Take 1 tablet (3 mg total) by mouth daily at 6 PM., Disp: 30 tablet, Rfl: 1  Review of Systems  Social History   Tobacco Use  . Smoking status: Former Smoker    Packs/day: 0.50    Years: 15.00    Pack years: 7.50    Types: Cigarettes    Last attempt to quit: 04/25/2014    Years since quitting: 3.7  . Smokeless tobacco: Never Used  . Tobacco comment: quit around june 2015    Substance Use Topics  . Alcohol use: No    Frequency: Never   Objective:   There were no vitals taken for this visit. There were no vitals filed for this visit.   Physical Exam      Assessment & Plan:

## 2018-01-13 ENCOUNTER — Ambulatory Visit: Payer: Medicare HMO | Admitting: Family Medicine

## 2018-01-16 ENCOUNTER — Ambulatory Visit (INDEPENDENT_AMBULATORY_CARE_PROVIDER_SITE_OTHER): Payer: Medicare HMO | Admitting: Family Medicine

## 2018-01-16 ENCOUNTER — Encounter: Payer: Self-pay | Admitting: Family Medicine

## 2018-01-16 VITALS — BP 120/84 | HR 53 | Temp 98.7°F | Resp 16 | Wt 371.0 lb

## 2018-01-16 DIAGNOSIS — Z794 Long term (current) use of insulin: Secondary | ICD-10-CM

## 2018-01-16 DIAGNOSIS — F419 Anxiety disorder, unspecified: Secondary | ICD-10-CM | POA: Diagnosis not present

## 2018-01-16 DIAGNOSIS — N183 Chronic kidney disease, stage 3 (moderate): Secondary | ICD-10-CM | POA: Diagnosis not present

## 2018-01-16 DIAGNOSIS — E1122 Type 2 diabetes mellitus with diabetic chronic kidney disease: Secondary | ICD-10-CM | POA: Diagnosis not present

## 2018-01-16 DIAGNOSIS — J9621 Acute and chronic respiratory failure with hypoxia: Secondary | ICD-10-CM | POA: Diagnosis not present

## 2018-01-16 DIAGNOSIS — E1142 Type 2 diabetes mellitus with diabetic polyneuropathy: Secondary | ICD-10-CM | POA: Diagnosis not present

## 2018-01-16 DIAGNOSIS — R809 Proteinuria, unspecified: Secondary | ICD-10-CM | POA: Diagnosis not present

## 2018-01-16 DIAGNOSIS — R69 Illness, unspecified: Secondary | ICD-10-CM | POA: Diagnosis not present

## 2018-01-16 DIAGNOSIS — I10 Essential (primary) hypertension: Secondary | ICD-10-CM | POA: Diagnosis not present

## 2018-01-16 MED ORDER — ALPRAZOLAM 0.5 MG PO TABS: 0.5000 mg | ORAL_TABLET | Freq: Two times a day (BID) | ORAL | 1 refills | Status: DC

## 2018-01-16 MED ORDER — INSULIN DETEMIR 100 UNIT/ML ~~LOC~~ SOLN: 65.0000 [IU] | Freq: Two times a day (BID) | SUBCUTANEOUS | 5 refills | Status: DC

## 2018-01-16 MED ORDER — LOSARTAN POTASSIUM 100 MG PO TABS: 100.0000 mg | ORAL_TABLET | Freq: Every day | ORAL | 3 refills | Status: DC

## 2018-01-16 NOTE — Assessment & Plan Note (Signed)
Chronic and uncontrolled with hyperglycemia A1c grossly elevated at last check Information given for contacting podiatrist Increase Levemir to 65 units twice daily Continue NovoLog 40 units with each meal, but if blood sugars are consistently elevated to greater than 200 postprandial, we will increase this again as well Continue to check fasting and postprandial glucoses at least once daily Long discussion on diet regarding blood sugar appointment with ophthalmology Referral to endocrinology for difficult to control hyperglycemia F/u in 1 month for ongoing titration of insulin

## 2018-01-16 NOTE — Progress Notes (Signed)
Patient: Alexander Alexander Male    DOB: 11-Feb-1971   47 y.o.   MRN: [id number] Visit Date: 01/16/2018  Today's Provider: Shirlee LatchAngela Bacigalupo, MD   I, Joslyn HyEmily Ratchford, CMA, am acting as scribe for Shirlee LatchAngela Bacigalupo, MD.  Chief Complaint  Patient presents with  . Diabetes   Subjective:    HPI      Diabetes Mellitus Type II, Follow-up:   Lab Results  Component Value Date   HGBA1C 13.9 (H) 12/09/2017    Last seen for diabetes on 12/26/2017. Management since then includes increasing Novolog to 35 units with each meal. If BS consistently running >200, increase Novolog to 40 units with each meal. He reports good compliance with treatment. Is taking 40 units of Novolog with each meal. He is not having side effects.  Current symptoms include foot ulcerations, hyperglycemia, nausea, paresthesia of the feet, polydipsia, polyuria and visual disturbances and have been worsening. Home blood sugar records: in the 300's; both fasting and postprandial  Episodes of hypoglycemia? Possibly 1 low due to accidentally taking too much insulin.   Current Insulin Regimen: Levemir 60 units BID, Novolog 40 units with each meal. Weight trend: increasing steadily Current diet: pt just saw his nephrologist. Dr. Thedore MinsSingh advised pt to cut out carbs, which will improve BS, which will improve edema Current exercise: none  Has eye appointment coming up.  Pertinent Labs:    Component Value Date/Time   CHOL 175 12/09/2017 1017   TRIG 334 (H) 12/09/2017 1017   HDL 31 (L) 12/09/2017 1017   LDLCALC 77 12/09/2017 1017   CREATININE 2.42 (H) 12/26/2017 1145    Wt Readings from Last 3 Encounters:  01/16/18 (!) 371 lb (168.3 kg)  12/26/17 (!) 363 lb (164.7 kg)  12/09/17 (!) 369 lb 8 oz (167.6 kg)    ------------------------------------------------------------------------ Neuropathy, in hands and feet, 2/2 T2DM: Worsening with elevated blood sugars.  Pain is severe.  Planning to cut out all carbs from diet.  Unable to tolerate amitriptyline due to drowsiness.  Continues to take gabapentin (dosage limited 2/2 CKD)  Anxiety: Worsening in setting of worsening DM.  He is nervous because he has h/o diabetic coma with blood sugars in 1100s.  He has not heard back from Psych referral.  He would like 1 more month supply of Xanax to hold over for this appt  Morbid obesity: He is trying to work on his diet to help with DM.  He is interested in bariatric surgery.  He would like to know where to start this process.  No Known Allergies   Current Outpatient Medications:  .  acetaminophen (TYLENOL) 325 MG tablet, Take 650 mg by mouth every 6 (six) hours as needed., Disp: , Rfl:  .  ALPRAZolam (XANAX) 0.5 MG tablet, Take 1 tablet (0.5 mg total) by mouth 2 (two) times daily., Disp: 60 tablet, Rfl: 1 .  gabapentin (NEURONTIN) 300 MG capsule, Take 2 capsules (600 mg total) by mouth 2 (two) times daily., Disp: 120 capsule, Rfl: 3 .  glimepiride (AMARYL) 2 MG tablet, Take 1 tablet (2 mg total) by mouth daily with breakfast., Disp: 30 tablet, Rfl: 5 .  insulin detemir (LEVEMIR) 100 UNIT/ML injection, Inject 0.65 mLs (65 Units total) into the skin 2 (two) times daily., Disp: 40 mL, Rfl: 5 .  losartan (COZAAR) 100 MG tablet, Take 1 tablet (100 mg total) by mouth daily., Disp: 90 tablet, Rfl: 3 .  metoprolol (TOPROL-XL) 200 MG 24 hr tablet, Take 200 mg  by mouth 2 (two) times daily., Disp: , Rfl: 0 .  NOVOLOG 100 UNIT/ML injection, Inject 35 Units into the skin 3 (three) times daily with meals., Disp: 40 mL, Rfl: 5 .  tiZANidine (ZANAFLEX) 4 MG tablet, Take 4 mg by mouth every 8 (eight) hours as needed for muscle spasms., Disp: , Rfl:  .  torsemide (DEMADEX) 20 MG tablet, Take 2 tablets (40 mg total) by mouth 2 (two) times daily., Disp: 120 tablet, Rfl: 3 .  warfarin (COUMADIN) 3 MG tablet, Take 1 tablet (3 mg total) by mouth daily at 6 PM., Disp: 30 tablet, Rfl: 1  Review of Systems  Eyes: Positive for visual disturbance.   Gastrointestinal: Positive for nausea.  Endocrine: Positive for polydipsia and polyuria.  Skin: Positive for wound.    Social History   Tobacco Use  . Smoking status: Former Smoker    Packs/day: 0.50    Years: 15.00    Pack years: 7.50    Types: Cigarettes    Last attempt to quit: 04/25/2014    Years since quitting: 3.7  . Smokeless tobacco: Never Used  . Tobacco comment: quit around june 2015  Substance Use Topics  . Alcohol use: No    Frequency: Never   Objective:   BP 120/84 (BP Location: Right Arm, Patient Position: Sitting, Cuff Size: Large)   Pulse (!) 53   Temp 98.7 F (37.1 C) (Oral)   Resp 16   Wt (!) 371 lb (168.3 kg)   SpO2 95%   BMI 53.23 kg/m  Vitals:   01/16/18 1105  BP: 120/84  Pulse: (!) 53  Resp: 16  Temp: 98.7 F (37.1 C)  TempSrc: Oral  SpO2: 95%  Weight: (!) 371 lb (168.3 kg)     Physical Exam  Constitutional: He is oriented to person, place, and time. He appears well-developed and well-nourished. No distress.  HENT:  Head: Normocephalic and atraumatic.  Mouth/Throat: Oropharynx is clear and moist.  Eyes: Conjunctivae are normal. No scleral icterus.  Cardiovascular: Normal rate, regular rhythm and intact distal pulses.  Murmur heard. Pulmonary/Chest: Effort normal and breath sounds normal. No respiratory distress. He has no wheezes. He has no rales.  Abdominal: Soft. He exhibits no distension. There is no tenderness.  Musculoskeletal: He exhibits edema. He exhibits no deformity.  Neurological: He is alert and oriented to person, place, and time.  Skin: Skin is warm and dry. No rash noted.  Psychiatric: His speech is normal and behavior is normal. Judgment and thought content normal. His mood appears anxious. Cognition and memory are normal. He does not exhibit a depressed mood.  Vitals reviewed.       Assessment & Plan:     Problem List Items Addressed This Visit      Endocrine   T2DM (type 2 diabetes mellitus) (HCC) - Primary      Chronic and uncontrolled with hyperglycemia A1c grossly elevated at last check Information given for contacting podiatrist Increase Levemir to 65 units twice daily Continue NovoLog 40 units with each meal, but if blood sugars are consistently elevated to greater than 200 postprandial, we will increase this again as well Continue to check fasting and postprandial glucoses at least once daily Long discussion on diet regarding blood sugar appointment with ophthalmology Referral to endocrinology for difficult to control hyperglycemia F/u in 1 month for ongoing titration of insulin      Relevant Medications   losartan (COZAAR) 100 MG tablet   insulin detemir (LEVEMIR) 100 UNIT/ML injection  Other Relevant Orders   Ambulatory referral to Endocrinology   Diabetic neuropathy (HCC)    Chronic and uncontrolled Continue gabapentin at current dose, which is his maximum given his GFR Unable to tolerate amitriptyline due to drowsiness May improve with improved blood glucoses      Relevant Medications   losartan (COZAAR) 100 MG tablet   insulin detemir (LEVEMIR) 100 UNIT/ML injection     Other   Anxiety    Patient has not heard from psychiatric referral yet, so information was given for him to call appointment Refilled Xanax to hold him over until he is able to see psych who will manage from there      Relevant Medications   ALPRAZolam (XANAX) 0.5 MG tablet   Morbid obesity (HCC)    Given patient's many comorbidities, he may benefit greatly from bariatric surgery Referral to bariatric surgery placed today Discussed with patient that he will need to undergo the process, which includes testing, psychiatric evaluation, nutrition visits, etc.      Relevant Medications   insulin detemir (LEVEMIR) 100 UNIT/ML injection   Other Relevant Orders   Ambulatory referral to General Surgery       Return in about 4 weeks (around 02/13/2018) for diabetes f/u.   The entirety of the information  documented in the History of Present Illness, Review of Systems and Physical Exam were personally obtained by me. Portions of this information were initially documented by Irving Burton Ratchford, CMA and reviewed by me for thoroughness and accuracy.    Erasmo Downer, MD, MPH [facility] 01/16/2018 2:10 PM

## 2018-01-16 NOTE — Assessment & Plan Note (Signed)
Given patient's many comorbidities, he may benefit greatly from bariatric surgery Referral to bariatric surgery placed today Discussed with patient that he will need to undergo the process, which includes testing, psychiatric evaluation, nutrition visits, etc.

## 2018-01-16 NOTE — Assessment & Plan Note (Signed)
Patient has not heard from psychiatric referral yet, so information was given for him to call appointment Refilled Xanax to hold him over until he is able to see psych who will manage from there

## 2018-01-16 NOTE — Patient Instructions (Signed)

## 2018-01-16 NOTE — Assessment & Plan Note (Signed)
Chronic and uncontrolled Continue gabapentin at current dose, which is his maximum given his GFR Unable to tolerate amitriptyline due to drowsiness May improve with improved blood glucoses

## 2018-01-19 ENCOUNTER — Telehealth: Payer: Self-pay | Admitting: Family Medicine

## 2018-01-19 MED ORDER — GABAPENTIN 600 MG PO TABS
600.0000 mg | ORAL_TABLET | Freq: Two times a day (BID) | ORAL | 3 refills | Status: DC
Start: 2018-01-19 — End: 2018-06-06

## 2018-01-19 NOTE — Telephone Encounter (Signed)
[facility] [address] [facility] because pt is trying to get his gabapentin (NEURONTIN) 300 MG capsule and because the PA was denied it canceled the Rx that was received on 12/26/17. Clint stated that in order for pt to be able to take 600 mg twice a day they need a new Rx for gabapentin (NEURONTIN) 600 mg tablets twice a day. Clint is requesting that this be sent in today if possible because pt is completely out of medication. Please advise. Thanks TNP

## 2018-01-19 NOTE — Telephone Encounter (Signed)
Please review

## 2018-01-19 NOTE — Telephone Encounter (Signed)
Rx sent  Erasmo DownerBacigalupo, Angela M, MD, MPH [facility] 01/19/2018 11:16 AM

## 2018-01-20 ENCOUNTER — Telehealth: Payer: Self-pay | Admitting: *Deleted

## 2018-01-20 NOTE — Telephone Encounter (Signed)
Called patient and made him aware of his appt.with  Dr.Yu ([place]) on 01/20/18 @ 11:15.  Patient is aware of Location, Date and Time of his appt.

## 2018-01-21 ENCOUNTER — Other Ambulatory Visit: Payer: Self-pay | Admitting: Family Medicine

## 2018-01-24 ENCOUNTER — Inpatient Hospital Stay: Payer: Medicare HMO | Attending: Oncology | Admitting: Oncology

## 2018-01-24 ENCOUNTER — Inpatient Hospital Stay: Payer: Medicare HMO

## 2018-01-24 ENCOUNTER — Other Ambulatory Visit: Payer: Self-pay

## 2018-01-24 ENCOUNTER — Encounter: Payer: Self-pay | Admitting: Oncology

## 2018-01-24 VITALS — BP 106/78 | HR 109 | Temp 97.9°F | Resp 20 | Ht 63.78 in | Wt 383.4 lb

## 2018-01-24 DIAGNOSIS — I509 Heart failure, unspecified: Secondary | ICD-10-CM | POA: Insufficient documentation

## 2018-01-24 DIAGNOSIS — D751 Secondary polycythemia: Secondary | ICD-10-CM

## 2018-01-24 LAB — CBC WITH DIFFERENTIAL/PLATELET
Basophils Absolute: 0.1 10*3/uL (ref 0–0.1)
Basophils Relative: 2 %
EOS PCT: 1 %
Eosinophils Absolute: 0.1 10*3/uL (ref 0–0.7)
HCT: 50 % (ref 40.0–52.0)
HEMOGLOBIN: 16.2 g/dL (ref 13.0–18.0)
LYMPHS ABS: 2.7 10*3/uL (ref 1.0–3.6)
LYMPHS PCT: 46 %
MCH: 28.2 pg (ref 26.0–34.0)
MCHC: 32.5 g/dL (ref 32.0–36.0)
MCV: 86.6 fL (ref 80.0–100.0)
Monocytes Absolute: 0.9 10*3/uL (ref 0.2–1.0)
Monocytes Relative: 16 %
Neutro Abs: 2 10*3/uL (ref 1.4–6.5)
Neutrophils Relative %: 35 %
PLATELETS: 165 10*3/uL (ref 150–440)
RBC: 5.77 MIL/uL (ref 4.40–5.90)
RDW: 17.5 % — ABNORMAL HIGH (ref 11.5–14.5)
WBC: 5.8 10*3/uL (ref 3.8–10.6)

## 2018-01-24 NOTE — Progress Notes (Signed)
Here for new pt evaluation.  

## 2018-01-24 NOTE — Progress Notes (Signed)
Hematology/Oncology Consult note [facility] [phone]) [phone] [phone]   Patient Care Team: Erasmo DownerBacigalupo, Angela M, MD as PCP - General (Family Medicine) Delma FreezeHackney, Tina A, FNP as Nurse Practitioner (Family Medicine)  REFERRING PROVIDER: Dr.Singe.  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  male with PMH listed below who was referred to me for evaluation of erythrocytosis. Patient reports that he recently moved to the area at the end of last year.  Patient has multiple comorbidities including congestive heart failure, morbid obesity, possible sleep apnea, CKD, atrial fibrillation, history of mechanical aortic valve replacement, on chronic anticoagulation..  He has been hospitalized twice since he moved to the area.  Lab work has shown that he has erythrocytosis with hemoglobin ranges from 17-18.3.  Referred to us for erythrocytosis workup.  Patient said he has a history of sleep apnea however he has not had a recent study yet.  He tells me that he cannot find a good mask that fit his face so he has not been using CPAP machine at home.  Use oxygen as needed at home.  He lives in a townhouse which is to floor and oxygen is only available at the first floor.  So he is not getting his to sleep.  Reports feeling tired, chronic shortness of breath, denies any chest pain, palpitation, abdominal pain.    Review of Systems  Constitutional: Positive for malaise/fatigue. Negative for chills and fever.  HENT: Negative for nosebleeds.   Eyes: Negative for blurred vision and photophobia.  Respiratory: Positive for shortness of breath. Negative for cough.   Cardiovascular: Positive for leg swelling. Negative for chest pain and claudication.  Gastrointestinal: Negative for nausea and vomiting.  Genitourinary: Negative for dysuria.  Musculoskeletal: Negative for myalgias.    Skin: Negative for rash.  Neurological: Negative for tingling, tremors and sensory change.  Endo/Heme/Allergies: Bruises/bleeds easily.  Psychiatric/Behavioral: Negative for depression.    MEDICAL HISTORY:  Past Medical History:  Diagnosis Date  . Allergy   . Anxiety   . Arrhythmia   . Arthritis   . Atrial fibrillation (HCC)   . CHF (congestive heart failure) (HCC)   . CKD (chronic kidney disease)   . Diabetes mellitus without complication (HCC)   . Hyperlipidemia   . Hypertension   . Pancreatitis   . Sleep apnea     SURGICAL HISTORY: Past Surgical History:  Procedure Laterality Date  . ABLATION  08/01/2017  . MECHANICAL AORTIC VALVE REPLACEMENT      SOCIAL HISTORY: Social History   Socioeconomic History  . Marital status: Married    Spouse name: Not on file  . Number of children: 0  . Years of education: Not on file  . Highest education level: Not on file  Social Needs  . Financial resource strain: Not hard at all  . Food insecurity - worry: Never true  . Food insecurity - inability: Never true  . Transportation needs - medical: No  . Transportation needs - non-medical: No  Occupational History  . Occupation: retired    Comment: Building control surveyorretail management  . Occupation: permanent disability  Tobacco Use  . Smoking status: Former Smoker    Packs/day: 0.50    Years: 20.00    Pack years: 10.00    Types: Cigarettes    Last attempt to quit: 04/25/2014    Years since quitting: 3.7  . Smokeless tobacco: Never Used  . Tobacco  comment: quit around june 2015  Substance and Sexual Activity  . Alcohol use: No    Frequency: Never  . Drug use: No    Comment: January 28, 2017 quit Cocaine  . Sexual activity: No  Other Topics Concern  . Not on file  Social History Narrative  . Not on file    FAMILY HISTORY: Family History  Problem Relation Age of Onset  . CAD Mother   . Hypertension Mother   . Hypertension Father   . Asthma Father   . Dementia Father   . Diabetes  Brother   . Hypertension Brother   . Hypertension Brother   . Pancreatic cancer Paternal Aunt        several aunts and uncles with pancreatic cancer on paternal side  . Breast cancer Maternal Aunt   . Colon cancer Neg Hx   . Prostate cancer Neg Hx     ALLERGIES:  has No Known Allergies.  MEDICATIONS:  Current Outpatient Medications  Medication Sig Dispense Refill  . ALPRAZolam (XANAX) 0.5 MG tablet Take 1 tablet (0.5 mg total) by mouth 2 (two) times daily. 60 tablet 1  . gabapentin (NEURONTIN) 600 MG tablet Take 1 tablet (600 mg total) by mouth 2 (two) times daily. 180 tablet 3  . glimepiride (AMARYL) 2 MG tablet Take 1 tablet (2 mg total) by mouth daily with breakfast. 30 tablet 5  . insulin detemir (LEVEMIR) 100 UNIT/ML injection Inject 0.65 mLs (65 Units total) into the skin 2 (two) times daily. 40 mL 5  . losartan (COZAAR) 100 MG tablet Take 1 tablet (100 mg total) by mouth daily. 90 tablet 3  . metoprolol (TOPROL-XL) 200 MG 24 hr tablet Take 200 mg by mouth 2 (two) times daily.  0  . NOVOLOG 100 UNIT/ML injection Inject 35 Units into the skin 3 (three) times daily with meals. 40 mL 5  . tiZANidine (ZANAFLEX) 4 MG tablet Take 4 mg by mouth every 8 (eight) hours as needed for muscle spasms.    Marland Kitchen warfarin (COUMADIN) 3 MG tablet Take 1 tablet (3 mg total) by mouth daily at 6 PM. (Patient taking differently: Take 5 mg by mouth daily at 6 PM. ) 30 tablet 1  . acetaminophen (TYLENOL) 325 MG tablet Take 650 mg by mouth every 6 (six) hours as needed.    . torsemide (DEMADEX) 20 MG tablet Take 2 tablets (40 mg total) by mouth 2 (two) times daily. 120 tablet 3   No current facility-administered medications for this visit.      PHYSICAL EXAMINATION: ECOG PERFORMANCE STATUS: 2 - Symptomatic, <50% confined to bed Vitals:   01/24/18 1111  BP: 106/78  Pulse: (!) 109  Resp: 20  Temp: 97.9 F (36.6 C)   Filed Weights   01/24/18 1111  Weight: (!) 383 lb 6.4 oz (173.9 kg)    Physical  Exam  Constitutional: He is oriented to person, place, and time. No distress.  Morbidly obese  HENT:  Head: Normocephalic and atraumatic.  Mouth/Throat: No oropharyngeal exudate.  Eyes: EOM are normal. Pupils are equal, round, and reactive to light. No scleral icterus.  Neck: Normal range of motion. Neck supple.  Cardiovascular:  Tachycardic, mechanical valve click sound  Pulmonary/Chest: Effort normal and breath sounds normal.  Abdominal: Soft. Bowel sounds are normal. There is no tenderness.  Musculoskeletal: Normal range of motion. He exhibits edema.  Neurological: He is alert and oriented to person, place, and time.  Skin: Skin is dry.  Psychiatric:  Affect normal.     LABORATORY DATA:  I have reviewed the data as listed Lab Results  Component Value Date   WBC 5.8 01/24/2018   HGB 16.2 01/24/2018   HCT 50.0 01/24/2018   MCV 86.6 01/24/2018   PLT 165 01/24/2018   Recent Labs    11/16/17 1057  12/07/17 0303 12/09/17 1017 12/26/17 1145  NA 137   < > 134* 135 135  K 5.1   < > 5.1 4.7 6.5*  CL 98*   < > 87* 85* 92*  CO2 32   < > 36* 28 23  GLUCOSE 390*   < > 472* 340* 371*  BUN 30*   < > 55* 54* 47*  CREATININE 1.93*   < > 2.40* 2.43* 2.42*  CALCIUM 8.6*   < > 9.3 9.7 10.1  GFRNONAA 39*   < > 30* 30* 30*  GFRAA 46*   < > 35* 35* 35*  PROT 7.8  --   --  8.4  --   ALBUMIN 3.7  --   --  4.2  --   AST 26  --   --  25  --   ALT 27  --   --  22  --   ALKPHOS 85  --   --  79  --   BILITOT 1.0  --   --  1.0  --    < > = values in this interval not displayed.       ASSESSMENT & PLAN:  1. Erythrocytosis    Discussed with patient and his wife that given his multiple comorbidities including CHF, morbid obesity and a possible history of sleep apnea, his erythrocytosis most likely secondary due to hypoxia.  I would go ahead and repeat CBC and send Jak 2 mutation with reflex although I think this is less likely a primary process.  Patient denies current cigarette use or  testosterone replacement therapy. I would refer him to see Dr. Linward Natal pulmonology to establish care and repeat sleep study for possible sleep apnea or obesity hypoventilation syndrome.  All questions were answered. The patient knows to call the clinic with any problems questions or concerns.  Return of visit: 4 weeks Thank you for this kind referral and the opportunity to participate in the care of this patient. A copy of today's note is routed to referring provider    Rickard Patience, MD, PhD Hematology Oncology [facility] Pager- [phone] 01/24/2018

## 2018-01-25 ENCOUNTER — Other Ambulatory Visit: Payer: Self-pay | Admitting: Family Medicine

## 2018-01-25 DIAGNOSIS — E119 Type 2 diabetes mellitus without complications: Secondary | ICD-10-CM | POA: Diagnosis not present

## 2018-01-25 LAB — HM DIABETES EYE EXAM

## 2018-01-31 LAB — CALR + JAK2 E12-15 + MPL (REFLEXED)

## 2018-01-31 LAB — JAK2 V617F, W REFLEX TO CALR/E12/MPL

## 2018-02-15 ENCOUNTER — Ambulatory Visit: Payer: Medicare HMO | Admitting: Psychiatry

## 2018-02-16 ENCOUNTER — Encounter: Payer: Self-pay | Admitting: Family Medicine

## 2018-02-16 ENCOUNTER — Ambulatory Visit (INDEPENDENT_AMBULATORY_CARE_PROVIDER_SITE_OTHER): Payer: Medicare HMO | Admitting: Family Medicine

## 2018-02-16 VITALS — BP 108/72 | HR 111 | Temp 98.3°F | Resp 16 | Wt 383.0 lb

## 2018-02-16 DIAGNOSIS — N183 Chronic kidney disease, stage 3 unspecified: Secondary | ICD-10-CM

## 2018-02-16 DIAGNOSIS — I5032 Chronic diastolic (congestive) heart failure: Secondary | ICD-10-CM

## 2018-02-16 DIAGNOSIS — R601 Generalized edema: Secondary | ICD-10-CM | POA: Diagnosis not present

## 2018-02-16 DIAGNOSIS — Z794 Long term (current) use of insulin: Secondary | ICD-10-CM

## 2018-02-16 DIAGNOSIS — I481 Persistent atrial fibrillation: Secondary | ICD-10-CM | POA: Diagnosis not present

## 2018-02-16 DIAGNOSIS — M1A3790 Chronic gout due to renal impairment, unspecified ankle and foot, without tophus (tophi): Secondary | ICD-10-CM

## 2018-02-16 DIAGNOSIS — M109 Gout, unspecified: Secondary | ICD-10-CM | POA: Insufficient documentation

## 2018-02-16 DIAGNOSIS — I5031 Acute diastolic (congestive) heart failure: Secondary | ICD-10-CM | POA: Diagnosis not present

## 2018-02-16 DIAGNOSIS — R0602 Shortness of breath: Secondary | ICD-10-CM | POA: Diagnosis not present

## 2018-02-16 DIAGNOSIS — E1122 Type 2 diabetes mellitus with diabetic chronic kidney disease: Secondary | ICD-10-CM

## 2018-02-16 DIAGNOSIS — J9621 Acute and chronic respiratory failure with hypoxia: Secondary | ICD-10-CM | POA: Diagnosis not present

## 2018-02-16 DIAGNOSIS — Z952 Presence of prosthetic heart valve: Secondary | ICD-10-CM | POA: Diagnosis not present

## 2018-02-16 MED ORDER — ALLOPURINOL 100 MG PO TABS: 50.0000 mg | ORAL_TABLET | Freq: Every day | ORAL | 1 refills | Status: DC

## 2018-02-16 MED ORDER — COLCHICINE 0.6 MG PO TABS: 0.6000 mg | ORAL_TABLET | Freq: Every day | ORAL | 1 refills | Status: DC

## 2018-02-16 NOTE — Patient Instructions (Signed)

## 2018-02-16 NOTE — Progress Notes (Signed)
Patient: Alexander RiffleOtis Leon Trauger Male    DOB: 1971-09-05   46 y.o.   MRN: [id number] Visit Date: 02/16/2018  Today's Provider: Shirlee LatchAngela Bacigalupo, MD   I, Joslyn HyEmily Ratchford, CMA, am acting as scribe for Shirlee LatchAngela Bacigalupo, MD.  Chief Complaint  Patient presents with  . Diabetes   Subjective:    HPI      Diabetes Mellitus Type II, Follow-up:   Lab Results  Component Value Date   HGBA1C 13.9 (H) 12/09/2017    Last seen for diabetes 1 months ago.  Management since then includes increasing Levemir to 65 IU BID. Continue NovoLog 40 units with each meal, but if blood sugars are consistently elevated to greater than 200 postprandial, we will increase this again as well. He reports fair compliance with treatment. He is taking Levemir 60 IU BID, and Novolog 40 units with each meal. He is having side effects.  Current symptoms include foot ulcerations, paresthesia of the feet and feet swelling and pain; pt is concerned about this as it caused him to fall down stairs and have been worsening. Home blood sugar records: fasting range: 78-134 and postprandial range: 137-199  Episodes of hypoglycemia? yes - 3 times since LOV   Most Recent Eye Exam: March 2019 Weight trend: stable Current diet: on average, 1-1.5 meals per day, low salt Current exercise: none  Pertinent Labs:    Component Value Date/Time   CHOL 175 12/09/2017 1017   TRIG 334 (H) 12/09/2017 1017   HDL 31 (L) 12/09/2017 1017   LDLCALC 77 12/09/2017 1017   CREATININE 2.42 (H) 12/26/2017 1145    Wt Readings from Last 3 Encounters:  02/16/18 (!) 383 lb (173.7 kg)  01/24/18 (!) 383 lb 6.4 oz (173.9 kg)  01/16/18 (!) 371 lb (168.3 kg)    CHF: Patient is concerned about continued lower extremity edema.  He has missed his last few appointments for heart failure clinic.  He was switched from Lasix to torsemide, presumably due to gut edema and poor absorption of Lasix.  He is drinking 2.5-3 L of water daily.  He believes he is  avoiding salt well.  He is very inactive.  He wants to know if this is a death sentence.  Patient is also following with cardiology for management of Coumadin and A. fib.  He is also seen by Dr. Thedore MinsSingh for his CKD.  He has been having more recent flares of his gout.  He reports he was previously on Uloric several years ago.  He does not know whether he tried allopurinol.  He has taken colchicine in the past for flares of gout.  He is not currently having a flare. ------------------------------------------------------------------------    No Known Allergies   Current Outpatient Medications:  .  acetaminophen (TYLENOL) 325 MG tablet, Take 650 mg by mouth every 6 (six) hours as needed., Disp: , Rfl:  .  ALPRAZolam (XANAX) 0.5 MG tablet, Take 1 tablet (0.5 mg total) by mouth 2 (two) times daily., Disp: 60 tablet, Rfl: 1 .  gabapentin (NEURONTIN) 600 MG tablet, Take 1 tablet (600 mg total) by mouth 2 (two) times daily., Disp: 180 tablet, Rfl: 3 .  glimepiride (AMARYL) 2 MG tablet, Take 1 tablet (2 mg total) by mouth daily with breakfast., Disp: 30 tablet, Rfl: 5 .  insulin detemir (LEVEMIR) 100 UNIT/ML injection, Inject 0.65 mLs (65 Units total) into the skin 2 (two) times daily., Disp: 40 mL, Rfl: 5 .  losartan (COZAAR) 100 MG tablet,  Take 1 tablet (100 mg total) by mouth daily., Disp: 90 tablet, Rfl: 3 .  metoprolol (TOPROL-XL) 200 MG 24 hr tablet, Take 200 mg by mouth 2 (two) times daily., Disp: , Rfl: 0 .  NOVOLOG 100 UNIT/ML injection, Inject 35 Units into the skin 3 (three) times daily with meals. (Patient taking differently: Inject 40 Units into the skin 3 (three) times daily with meals. ), Disp: 40 mL, Rfl: 5 .  tiZANidine (ZANAFLEX) 4 MG tablet, Take 4 mg by mouth every 8 (eight) hours as needed for muscle spasms., Disp: , Rfl:  .  torsemide (DEMADEX) 20 MG tablet, Take 2 tablets (40 mg total) by mouth 2 (two) times daily., Disp: 120 tablet, Rfl: 3 .  warfarin (COUMADIN) 3 MG tablet, Take 1  tablet (3 mg total) by mouth daily at 6 PM. (Patient taking differently: Take 5 mg by mouth daily at 6 PM. ), Disp: 30 tablet, Rfl: 1  Review of Systems  Constitutional: Positive for unexpected weight change (due to fluid retention). Negative for activity change, appetite change, chills and fever.  HENT: Negative.   Eyes: Positive for visual disturbance.  Respiratory: Positive for chest tightness (with exertion), shortness of breath (on exertion) and wheezing (on exertion). Negative for cough.   Cardiovascular: Positive for leg swelling. Negative for chest pain and palpitations.  Gastrointestinal: Negative.   Endocrine: Positive for polydipsia. Negative for polyphagia and polyuria.  Genitourinary: Negative.   Musculoskeletal: Negative.   Neurological: Negative.   Psychiatric/Behavioral: Negative.     Social History   Tobacco Use  . Smoking status: Former Smoker    Packs/day: 0.50    Years: 20.00    Pack years: 10.00    Types: Cigarettes    Last attempt to quit: 04/25/2014    Years since quitting: 3.8  . Smokeless tobacco: Never Used  . Tobacco comment: quit around june 2015  Substance Use Topics  . Alcohol use: No    Frequency: Never   Objective:   BP 108/72 (BP Location: Right Arm, Patient Position: Sitting, Cuff Size: Large)   Pulse (!) 111   Temp 98.3 F (36.8 C) (Oral)   Resp 16   Wt (!) 383 lb (173.7 kg)   SpO2 (!) 87%   BMI 66.20 kg/m  Vitals:   02/16/18 1321  BP: 108/72  Pulse: (!) 111  Resp: 16  Temp: 98.3 F (36.8 C)  TempSrc: Oral  SpO2: (!) 87%  Weight: (!) 383 lb (173.7 kg)     Physical Exam  Constitutional: He is oriented to person, place, and time. He appears well-developed and well-nourished. No distress.  Sitting in wheelchair  HENT:  Head: Normocephalic and atraumatic.  Eyes: Conjunctivae are normal. Right eye exhibits no discharge. Left eye exhibits no discharge. No scleral icterus.  Neck: Neck supple. No thyromegaly present.    Cardiovascular: Normal rate, regular rhythm and normal heart sounds.  No murmur heard. Pulmonary/Chest: Effort normal and breath sounds normal. No respiratory distress. He has no wheezes.  Musculoskeletal: He exhibits edema (2+). He exhibits no deformity.  Lymphadenopathy:    He has no cervical adenopathy.  Neurological: He is alert and oriented to person, place, and time.  Skin: Skin is warm and dry. Capillary refill takes less than 2 seconds.  Psychiatric: He has a normal mood and affect. His behavior is normal.  Vitals reviewed.      Assessment & Plan:   Problem List Items Addressed This Visit      Cardiovascular and  Mediastinum   Chronic diastolic heart failure (HCC)    Discussed what heart failure means and what prognosis is Continue beta-blocker, loop diuretic, losartan Needs to f/u with heart failure clinic May need higher doses of diuretics, though explained this is a delicate situation with his CKD Advised on fluid restriction to less than 1.5L daily        Endocrine   T2DM (type 2 diabetes mellitus) (HCC) - Primary    Chronic, last A1c 13.9 CBGs on home readings are much improved, however Continue levemir 60 units BID and Novolog 40units with each meal We will continue to monitor CBGs and adjust insulin prn Discussed low carb diet in depth UTD on eye exam, needs to see podiatry Previously referral to Endocrinology, but awaiting appt F/u in 1 month and check A1c Will request vaccine records from [facility] in [place]        Other   Gout    Likely 2/2 CKD Discussed increased CV death risk with Uloric We will try small dose of Allopurinol and titrate slowly given CKD Patient to discuss with Cardiology today as he is on Coumadin therapy and INR will need to be monitored closely Will start Colchicine 0.6mg  daily for at least next 6 months while initiating prophylaxis Check Uric acid and titrate allopurinol to goal <6      Relevant Orders   Uric acid      Return  in about 1 month (around 03/16/2018) for gout and DM f/u.   The entirety of the information documented in the History of Present Illness, Review of Systems and Physical Exam were personally obtained by me. Portions of this information were initially documented by Irving Burton Ratchford, CMA and reviewed by me for thoroughness and accuracy.    Erasmo Downer, MD, MPH [facility] 02/16/2018 3:48 PM

## 2018-02-16 NOTE — Assessment & Plan Note (Signed)
Discussed what heart failure means and what prognosis is Continue beta-blocker, loop diuretic, losartan Needs to f/u with heart failure clinic May need higher doses of diuretics, though explained this is a delicate situation with his CKD Advised on fluid restriction to less than 1.5L daily

## 2018-02-16 NOTE — Assessment & Plan Note (Signed)
Likely 2/2 CKD Discussed increased CV death risk with Uloric We will try small dose of Allopurinol and titrate slowly given CKD Patient to discuss with Cardiology today as he is on Coumadin therapy and INR will need to be monitored closely Will start Colchicine 0.6mg  daily for at least next 6 months while initiating prophylaxis Check Uric acid and titrate allopurinol to goal <6

## 2018-02-16 NOTE — Assessment & Plan Note (Signed)
Chronic, last A1c 13.9 CBGs on home readings are much improved, however Continue levemir 60 units BID and Novolog 40units with each meal We will continue to monitor CBGs and adjust insulin prn Discussed low carb diet in depth UTD on eye exam, needs to see podiatry Previously referral to Endocrinology, but awaiting appt F/u in 1 month and check A1c Will request vaccine records from [facility] in [place]

## 2018-02-17 LAB — URIC ACID: Uric Acid: 11.4 mg/dL — ABNORMAL HIGH (ref 3.7–8.6)

## 2018-02-20 ENCOUNTER — Telehealth: Payer: Self-pay | Admitting: Emergency Medicine

## 2018-02-20 NOTE — Telephone Encounter (Signed)
That is what the allopurinol is for.  This is a baseline level which we will compare to future levels at follow-up visits.  Erasmo DownerBacigalupo, Nel Stoneking M, MD, MPH [facility] 02/20/2018 4:44 PM

## 2018-02-20 NOTE — Telephone Encounter (Signed)
Pt advised. Is there anything you want to do about getting his levels down? Please advise. Thanks.

## 2018-02-20 NOTE — Telephone Encounter (Signed)
-----   Message from Erasmo DownerAngela M Bacigalupo, MD sent at 02/17/2018  8:30 AM EDT ----- Uric acid level is 11.4.  Goal for therapy is 6.  Erasmo DownerBacigalupo, Angela M, MD, MPH [facility] 02/17/2018 8:30 AM

## 2018-02-21 NOTE — Telephone Encounter (Signed)
Pt advised and agrees with plan. 

## 2018-02-21 NOTE — Telephone Encounter (Signed)
Please review

## 2018-02-21 NOTE — Telephone Encounter (Signed)
Pt advised, and acknowledges understanding. He is concerned because he has noticed decreased urinary output. He is taking torsemide and another medication to improve this problem, without relief. He states the other medication that works with torsemide was prescribed by cardiology. He will be calling back with the name of the medication. States he has not seen nephrology in a while, and would like advise on this.

## 2018-02-21 NOTE — Telephone Encounter (Signed)
He needs to discuss with the cardiologist/heart failure clinic and get an appt scheduled with nephrology.  Erasmo DownerBacigalupo, Angela M, MD, MPH [facility] 02/21/2018 11:13 AM

## 2018-02-21 NOTE — Telephone Encounter (Signed)
Pt called back with the name of his medication  Metolazone 2.5 mg tablets He take 1 tablet twice a day  His call back is [phone]  Thanks teri

## 2018-02-22 DIAGNOSIS — N183 Chronic kidney disease, stage 3 (moderate): Secondary | ICD-10-CM | POA: Diagnosis not present

## 2018-02-22 DIAGNOSIS — R809 Proteinuria, unspecified: Secondary | ICD-10-CM | POA: Diagnosis not present

## 2018-02-22 DIAGNOSIS — D751 Secondary polycythemia: Secondary | ICD-10-CM | POA: Diagnosis not present

## 2018-02-22 DIAGNOSIS — I10 Essential (primary) hypertension: Secondary | ICD-10-CM | POA: Diagnosis not present

## 2018-02-22 DIAGNOSIS — E1122 Type 2 diabetes mellitus with diabetic chronic kidney disease: Secondary | ICD-10-CM | POA: Diagnosis not present

## 2018-03-04 NOTE — Progress Notes (Deleted)
Patient ID: Alexander Alexander, male    DOB: 12-Apr-1971, 47 y.o.   MRN: [id number]  HPI  Alexander Alexander is a 47 y/o male with a history of atrial fibrillation, DM, HTN, CKD, sleep apnea, anxiety, previous tobacco use and chronic heart failure.   Echo report from 11/16/17 showed an EF of 45%.  Admitted 12/06/17 due to acute on chronic HF and AF. Cardiology consult obtained. Medications adjusted. Had subtherapeutic INR and he was bridged with lovenox. Discharged after 2 days. Admitted 11/16/17 due to HF exacerbation. Initially needed IV diuretics and then transitioned to oral diuretics. Cardiology consult obtained. Discharged the following day.   He presents today for a follow-up visit with a chief complaint of   Past Medical History:  Diagnosis Date  . Allergy   . Anxiety   . Arrhythmia   . Arthritis   . Atrial fibrillation (HCC)   . CHF (congestive heart failure) (HCC)   . CKD (chronic kidney disease)   . Diabetes mellitus without complication (HCC)   . Hyperlipidemia   . Hypertension   . Pancreatitis   . Sleep apnea    Past Surgical History:  Procedure Laterality Date  . ABLATION  08/01/2017  . MECHANICAL AORTIC VALVE REPLACEMENT     Family History  Problem Relation Age of Onset  . CAD Mother   . Hypertension Mother   . Hypertension Father   . Asthma Father   . Dementia Father   . Diabetes Brother   . Hypertension Brother   . Hypertension Brother   . Pancreatic cancer Paternal Aunt        several aunts and uncles with pancreatic cancer on paternal side  . Breast cancer Maternal Aunt   . Colon cancer Neg Hx   . Prostate cancer Neg Hx    Social History   Tobacco Use  . Smoking status: Former Smoker    Packs/day: 0.50    Years: 20.00    Pack years: 10.00    Types: Cigarettes    Last attempt to quit: 04/25/2014    Years since quitting: 3.8  . Smokeless tobacco: Never Used  . Tobacco comment: quit around june 2015  Substance Use Topics  . Alcohol use: No    Frequency:  Never   No Known Allergies   Review of Systems  Constitutional: Positive for fatigue. Negative for appetite change.  HENT: Negative for congestion, postnasal drip and sore throat.   Eyes: Negative.   Respiratory: Positive for shortness of breath. Negative for cough and chest tightness.   Cardiovascular: Negative for chest pain and leg swelling.  Gastrointestinal: Positive for abdominal distention. Negative for abdominal pain.  Endocrine: Negative.   Genitourinary: Negative.   Musculoskeletal: Positive for arthralgias (feet pain).  Skin: Negative.   Allergic/Immunologic: Negative.   Neurological: Positive for dizziness and numbness (in feet). Negative for light-headedness.  Hematological: Negative for adenopathy. Bruises/bleeds easily.  Psychiatric/Behavioral: Positive for sleep disturbance (not wearing CPAP). Negative for dysphoric mood. The patient is not nervous/anxious.      Physical Exam  Constitutional: He is oriented to person, place, and time. He appears well-developed and well-nourished.  HENT:  Head: Normocephalic and atraumatic.  Neck: Normal range of motion. Neck supple. JVD present.  Cardiovascular: Normal rate. An irregular rhythm present.  Pulmonary/Chest: Effort normal. He has no wheezes. He has no rales.  Abdominal: He exhibits distension. There is no tenderness.  Musculoskeletal: He exhibits no edema or tenderness.  Neurological: He is alert and oriented to  person, place, and time.  Skin: Skin is warm and dry.  Psychiatric: He has a normal mood and affect. His behavior is normal. Thought content normal.  Nursing note and vitals reviewed.   Assessment & Plan:  1: Chronic heart failure with preserved ejection fraction- - NYHA class III - moderately fluid overloaded today - weighing daily and has noticed a gradual weight gain. Instructed to call for an overnight weight gain of >2 pounds or a weekly weight gain of >5 pounds - weight  - not adding salt to his  food and has been trying to read food labels. Reviewed the importance of closely following a  sodium diet - drinking ~ 48-64 ounces of fluid daily - patient reports receiving his flu and pneumonia vaccines for this season - sees cardiology at [facility] sometime February 2019 - BNP from 12/06/17 was 64.0   2: HTN- - BP on the low side today - advised to check it at home every few days - saw PCP Alexander Alexander) on 02/16/18 - BMP from 12/26/17 reviewed and showed sodium 135, potassium 6.5 and GFR 35  3: Diabetes- - glucose in clinic was  - ate unsweetened applesauce in the middle of the night - [facility] and may also need endocrinology referral  4: Aortic valve replacement-  - INR from 12/08/17 was 1.24 - follows with cardiology Alexander Alexander)  Patient did not bring his medications nor a list. Each medication was verbally reviewed with the patient and he was encouraged to bring the bottles to every visit to confirm accuracy of list.

## 2018-03-06 ENCOUNTER — Ambulatory Visit: Payer: Medicare HMO | Admitting: Family

## 2018-03-06 NOTE — Progress Notes (Deleted)
Patient ID: Alexander Alexander, male    DOB: 12-Apr-1971, 47 y.o.   MRN: [id number]  HPI  [name] is a 47 y/o male with a history of atrial fibrillation, DM, HTN, CKD, sleep apnea, anxiety, previous tobacco use and chronic heart failure.   Echo report from 11/16/17 showed an EF of 45%.  Admitted 12/06/17 due to acute on chronic HF and AF. Cardiology consult obtained. Medications adjusted. Had subtherapeutic INR and he was bridged with lovenox. Discharged after 2 days. Admitted 11/16/17 due to HF exacerbation. Initially needed IV diuretics and then transitioned to oral diuretics. Cardiology consult obtained. Discharged the following day.   He presents today for a follow-up visit with a chief complaint of   Past Medical History:  Diagnosis Date  . Allergy   . Anxiety   . Arrhythmia   . Arthritis   . Atrial fibrillation (HCC)   . CHF (congestive heart failure) (HCC)   . CKD (chronic kidney disease)   . Diabetes mellitus without complication (HCC)   . Hyperlipidemia   . Hypertension   . Pancreatitis   . Sleep apnea    Past Surgical History:  Procedure Laterality Date  . ABLATION  08/01/2017  . MECHANICAL AORTIC VALVE REPLACEMENT     Family History  Problem Relation Age of Onset  . CAD Mother   . Hypertension Mother   . Hypertension Father   . Asthma Father   . Dementia Father   . Diabetes Brother   . Hypertension Brother   . Hypertension Brother   . Pancreatic cancer Paternal Aunt        several aunts and uncles with pancreatic cancer on paternal side  . Breast cancer Maternal Aunt   . Colon cancer Neg Hx   . Prostate cancer Neg Hx    Social History   Tobacco Use  . Smoking status: Former Smoker    Packs/day: 0.50    Years: 20.00    Pack years: 10.00    Types: Cigarettes    Last attempt to quit: 04/25/2014    Years since quitting: 3.8  . Smokeless tobacco: Never Used  . Tobacco comment: quit around june 2015  Substance Use Topics  . Alcohol use: No    Frequency:  Never   No Known Allergies   Review of Systems  Constitutional: Positive for fatigue. Negative for appetite change.  HENT: Negative for congestion, postnasal drip and sore throat.   Eyes: Negative.   Respiratory: Positive for shortness of breath. Negative for cough and chest tightness.   Cardiovascular: Negative for chest pain and leg swelling.  Gastrointestinal: Positive for abdominal distention. Negative for abdominal pain.  Endocrine: Negative.   Genitourinary: Negative.   Musculoskeletal: Positive for arthralgias (feet pain).  Skin: Negative.   Allergic/Immunologic: Negative.   Neurological: Positive for dizziness and numbness (in feet). Negative for light-headedness.  Hematological: Negative for adenopathy. Bruises/bleeds easily.  Psychiatric/Behavioral: Positive for sleep disturbance (not wearing CPAP). Negative for dysphoric mood. The patient is not nervous/anxious.      Physical Exam  Constitutional: He is oriented to person, place, and time. He appears well-developed and well-nourished.  HENT:  Head: Normocephalic and atraumatic.  Neck: Normal range of motion. Neck supple. JVD present.  Cardiovascular: Normal rate. An irregular rhythm present.  Pulmonary/Chest: Effort normal. He has no wheezes. He has no rales.  Abdominal: He exhibits distension. There is no tenderness.  Musculoskeletal: He exhibits no edema or tenderness.  Neurological: He is alert and oriented to  person, place, and time.  Skin: Skin is warm and dry.  Psychiatric: He has a normal mood and affect. His behavior is normal. Thought content normal.  Nursing note and vitals reviewed.   Assessment & Plan:  1: Chronic heart failure with preserved ejection fraction- - NYHA class III - moderately fluid overloaded today - weighing daily and has noticed a gradual weight gain. Instructed to call for an overnight weight gain of >2 pounds or a weekly weight gain of >5 pounds - weight  - not adding salt to his  food and has been trying to read food labels. Reviewed the importance of closely following a 2000mg sodium diet - drinking ~ 48-64 ounces of fluid daily - patient reports receiving his flu and pneumonia vaccines for this season - sees cardiology at [facility] sometime February 2019 - BNP from 12/06/17 was 64.0   2: HTN- - BP on the low side today - advised to check it at home every few days - saw PCP (Bacigalupo) on 02/16/18 - BMP from 12/26/17 reviewed and showed sodium 135, potassium 6.5 and GFR 35  3: Diabetes- - glucose in clinic was  - ate unsweetened applesauce in the middle of the night - [facility] and may also need endocrinology referral  4: Aortic valve replacement-  - INR from 12/08/17 was 1.24 - follows with cardiology ([place])  Patient did not bring his medications nor a list. Each medication was verbally reviewed with the patient and he was encouraged to bring the bottles to every visit to confirm accuracy of list. 

## 2018-03-07 ENCOUNTER — Inpatient Hospital Stay: Payer: Medicare HMO

## 2018-03-07 ENCOUNTER — Ambulatory Visit: Payer: Medicare HMO | Admitting: Family

## 2018-03-07 ENCOUNTER — Inpatient Hospital Stay: Payer: Medicare HMO | Admitting: Oncology

## 2018-03-07 NOTE — Progress Notes (Deleted)
Hematology/Oncology Consult note [facility] [phone] [phone]   Patient Care Team: Erasmo Downer, MD as PCP - General (Family Medicine) Delma Freeze, FNP as Nurse Practitioner (Family Medicine)  REFERRING PROVIDER: Dr.Singe.  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  male with PMH listed below who was referred to me for evaluation of erythrocytosis. Patient reports that he recently moved to the area at the end of last year.  Patient has multiple comorbidities including congestive heart failure, morbid obesity, possible sleep apnea, CKD, atrial fibrillation, history of mechanical aortic valve replacement, on chronic anticoagulation..  He has been hospitalized twice since he moved to the area.  Lab work has shown that he has erythrocytosis with hemoglobin ranges from 17-18.3.  Referred to [place] for erythrocytosis workup.  Patient said he has a history of sleep apnea however he has not had a recent study yet.  He tells me that he cannot find a good mask that fit his face so he has not been using CPAP machine at home.  Use oxygen as needed at home.  He lives in a townhouse which is to floor and oxygen is only available at the first floor.  So he is not getting his to sleep.  Reports feeling tired, chronic shortness of breath, denies any chest pain, palpitation, abdominal pain.    Review of Systems  Constitutional: Positive for malaise/fatigue. Negative for chills and fever.  HENT: Negative for nosebleeds.   Eyes: Negative for blurred vision and photophobia.  Respiratory: Positive for shortness of breath. Negative for cough.   Cardiovascular: Positive for leg swelling. Negative for chest pain and claudication.  Gastrointestinal: Negative for nausea and vomiting.  Genitourinary: Negative for dysuria.  Musculoskeletal: Negative for myalgias.    Skin: Negative for rash.  Neurological: Negative for tingling, tremors and sensory change.  Endo/Heme/Allergies: Bruises/bleeds easily.  Psychiatric/Behavioral: Negative for depression.    MEDICAL HISTORY:  Past Medical History:  Diagnosis Date  . Allergy   . Anxiety   . Arrhythmia   . Arthritis   . Atrial fibrillation (HCC)   . CHF (congestive heart failure) (HCC)   . CKD (chronic kidney disease)   . Diabetes mellitus without complication (HCC)   . Hyperlipidemia   . Hypertension   . Pancreatitis   . Sleep apnea     SURGICAL HISTORY: Past Surgical History:  Procedure Laterality Date  . ABLATION  08/01/2017  . MECHANICAL AORTIC VALVE REPLACEMENT      SOCIAL HISTORY: Social History   Socioeconomic History  . Marital status: Married    Spouse name: Not on file  . Number of children: 0  . Years of education: Not on file  . Highest education level: Not on file  Occupational History  . Occupation: retired    Comment: Building control surveyor  . Occupation: permanent disability  Social Needs  . Financial resource strain: Not hard at all  . Food insecurity:    Worry: Never true    Inability: Never true  . Transportation needs:    Medical: No    Non-medical: No  Tobacco Use  . Smoking status: Former Smoker    Packs/day: 0.50    Years: 20.00    Pack years: 10.00    Types: Cigarettes    Last attempt to quit: 04/25/2014    Years since quitting: 3.8  . Smokeless tobacco: Never Used  . Tobacco  comment: quit around june 2015  Substance and Sexual Activity  . Alcohol use: No    Frequency: Never  . Drug use: No    Types: Cocaine    Comment: January 28, 2017 quit Cocaine  . Sexual activity: Never  Lifestyle  . Physical activity:    Days per week: 0 days    Minutes per session: 0 min  . Stress: To some extent  Relationships  . Social connections:    Talks on phone: Three times a week    Gets together: Three times a week    Attends religious service: Never    Active  member of club or organization: No    Attends meetings of clubs or organizations: Never    Relationship status: Married  . Intimate partner violence:    Fear of current or ex partner: No    Emotionally abused: No    Physically abused: No    Forced sexual activity: No  Other Topics Concern  . Not on file  Social History Narrative  . Not on file    FAMILY HISTORY: Family History  Problem Relation Age of Onset  . CAD Mother   . Hypertension Mother   . Hypertension Father   . Asthma Father   . Dementia Father   . Diabetes Brother   . Hypertension Brother   . Hypertension Brother   . Pancreatic cancer Paternal Aunt        several aunts and uncles with pancreatic cancer on paternal side  . Breast cancer Maternal Aunt   . Colon cancer Neg Hx   . Prostate cancer Neg Hx     ALLERGIES:  has No Known Allergies.  MEDICATIONS:  Current Outpatient Medications  Medication Sig Dispense Refill  . acetaminophen (TYLENOL) 325 MG tablet Take 650 mg by mouth every 6 (six) hours as needed.    Marland Kitchen allopurinol (ZYLOPRIM) 100 MG tablet Take 0.5 tablets (50 mg total) by mouth daily. 15 tablet 1  . ALPRAZolam (XANAX) 0.5 MG tablet Take 1 tablet (0.5 mg total) by mouth 2 (two) times daily. 60 tablet 1  . colchicine 0.6 MG tablet Take 1 tablet (0.6 mg total) by mouth daily. 30 tablet 1  . gabapentin (NEURONTIN) 600 MG tablet Take 1 tablet (600 mg total) by mouth 2 (two) times daily. 180 tablet 3  . glimepiride (AMARYL) 2 MG tablet Take 1 tablet (2 mg total) by mouth daily with breakfast. 30 tablet 5  . insulin detemir (LEVEMIR) 100 UNIT/ML injection Inject 0.65 mLs (65 Units total) into the skin 2 (two) times daily. 40 mL 5  . losartan (COZAAR) 100 MG tablet Take 1 tablet (100 mg total) by mouth daily. 90 tablet 3  . metoprolol (TOPROL-XL) 200 MG 24 hr tablet Take 200 mg by mouth 2 (two) times daily.  0  . NOVOLOG 100 UNIT/ML injection Inject 35 Units into the skin 3 (three) times daily with meals.  (Patient taking differently: Inject 40 Units into the skin 3 (three) times daily with meals. ) 40 mL 5  . tiZANidine (ZANAFLEX) 4 MG tablet Take 4 mg by mouth every 8 (eight) hours as needed for muscle spasms.    Marland Kitchen torsemide (DEMADEX) 20 MG tablet Take 2 tablets (40 mg total) by mouth 2 (two) times daily. 120 tablet 3  . warfarin (COUMADIN) 3 MG tablet Take 1 tablet (3 mg total) by mouth daily at 6 PM. (Patient taking differently: Take 5 mg by mouth daily at 6 PM. )  30 tablet 1   No current facility-administered medications for this visit.      PHYSICAL EXAMINATION: ECOG PERFORMANCE STATUS: 2 - Symptomatic, <50% confined to bed There were no vitals filed for this visit. There were no vitals filed for this visit.  Physical Exam  Constitutional: He is oriented to person, place, and time. No distress.  Morbidly obese  HENT:  Head: Normocephalic and atraumatic.  Mouth/Throat: No oropharyngeal exudate.  Eyes: Pupils are equal, round, and reactive to light. EOM are normal. No scleral icterus.  Neck: Normal range of motion. Neck supple.  Cardiovascular:  Tachycardic, mechanical valve click sound  Pulmonary/Chest: Effort normal and breath sounds normal.  Abdominal: Soft. Bowel sounds are normal. There is no tenderness.  Musculoskeletal: Normal range of motion. He exhibits edema.  Neurological: He is alert and oriented to person, place, and time.  Skin: Skin is dry.  Psychiatric: Affect normal.     LABORATORY DATA:  I have reviewed the data as listed Lab Results  Component Value Date   WBC 5.8 01/24/2018   HGB 16.2 01/24/2018   HCT 50.0 01/24/2018   MCV 86.6 01/24/2018   PLT 165 01/24/2018   Recent Labs    11/16/17 1057  12/07/17 0303 12/09/17 1017 12/26/17 1145  NA 137   < > 134* 135 135  K 5.1   < > 5.1 4.7 6.5*  CL 98*   < > 87* 85* 92*  CO2 32   < > 36* 28 23  GLUCOSE 390*   < > 472* 340* 371*  BUN 30*   < > 55* 54* 47*  CREATININE 1.93*   < > 2.40* 2.43* 2.42*    CALCIUM 8.6*   < > 9.3 9.7 10.1  GFRNONAA 39*   < > 30* 30* 30*  GFRAA 46*   < > 35* 35* 35*  PROT 7.8  --   --  8.4  --   ALBUMIN 3.7  --   --  4.2  --   AST 26  --   --  25  --   ALT 27  --   --  22  --   ALKPHOS 85  --   --  79  --   BILITOT 1.0  --   --  1.0  --    < > = values in this interval not displayed.       ASSESSMENT & PLAN:  1. Erythrocytosis    Discussed with patient and his wife that given his multiple comorbidities including CHF, morbid obesity and a possible history of sleep apnea, his erythrocytosis most likely secondary due to hypoxia.  I would go ahead and repeat CBC and send Jak 2 mutation with reflex although I think this is less likely a primary process.  Patient denies current cigarette use or testosterone replacement therapy. I would refer him to see Dr. Linward Natal pulmonology to establish care and repeat sleep study for possible sleep apnea or obesity hypoventilation syndrome.  All questions were answered. The patient knows to call the clinic with any problems questions or concerns.  Return of visit: 4 weeks Thank you for this kind referral and the opportunity to participate in the care of this patient. A copy of today's note is routed to referring provider    Rickard Patience, MD, PhD Hematology Oncology [facility] Pager- [phone] 03/07/2018

## 2018-03-10 ENCOUNTER — Other Ambulatory Visit: Payer: Self-pay | Admitting: Family Medicine

## 2018-03-10 NOTE — Telephone Encounter (Signed)
Pharmacy requesting refills. Thanks!  

## 2018-03-15 ENCOUNTER — Telehealth: Payer: Self-pay | Admitting: Emergency Medicine

## 2018-03-15 NOTE — Telephone Encounter (Signed)
Shaun with [place] [facility] was returning you call about pt. He said that pt has finished the seminar and would be returning his packet to them. He said if you had any other questions to let him know at [phone]

## 2018-03-16 ENCOUNTER — Telehealth: Payer: Self-pay | Admitting: Emergency Medicine

## 2018-03-16 DIAGNOSIS — F419 Anxiety disorder, unspecified: Secondary | ICD-10-CM

## 2018-03-16 NOTE — Telephone Encounter (Signed)
Pt called requesting another referral to [facility] psychiatry. He missed the appt with the first referral and they are requesting another referral. Please advise. Thanks.

## 2018-03-16 NOTE — Telephone Encounter (Signed)
Referral placed  Jhony Antrim, Marzella Schlein, MD, MPH [facility] 03/16/2018 2:44 PM

## 2018-03-16 NOTE — Telephone Encounter (Signed)
Please review. Thanks!  

## 2018-03-17 ENCOUNTER — Inpatient Hospital Stay: Payer: Medicare HMO

## 2018-03-17 ENCOUNTER — Inpatient Hospital Stay: Payer: Medicare HMO | Admitting: Oncology

## 2018-03-18 DIAGNOSIS — J9621 Acute and chronic respiratory failure with hypoxia: Secondary | ICD-10-CM | POA: Diagnosis not present

## 2018-03-22 ENCOUNTER — Ambulatory Visit: Payer: Self-pay | Admitting: Family Medicine

## 2018-03-27 ENCOUNTER — Ambulatory Visit: Payer: Self-pay | Admitting: Family Medicine

## 2018-03-27 NOTE — Progress Notes (Deleted)
Patient: Alexander Alexander Male    DOB: 1971-01-05   46 y.o.   MRN: [id number] Visit Date: 03/27/2018  Today's Provider: Shirlee Latch, MD   I, Joslyn Hy, CMA, am acting as scribe for Shirlee Latch, MD.  No chief complaint on file.  Subjective:    HPI      Diabetes Mellitus Type II, Follow-up:   Lab Results  Component Value Date   HGBA1C 13.9 (H) 12/09/2017    Last seen for diabetes 1 months ago.  Management since then includes continuing Levemir 60 units BID and Novolog 40 units with each meal.. He reports {excellent/good/fair/poor:19665} compliance with treatment. He {ACTION; IS/IS OZD:[id number]} having side effects. *** Current symptoms include {Symptoms; diabetes:14075} and have been {Desc; course:15616}. Home blood sugar records: {diabetes glucometry results:16657}  Episodes of hypoglycemia? {yes***/no:17258}   Current Insulin Regimen: *** Most Recent Eye Exam: *** Weight trend: {trend:16658} Prior visit with dietician: {yes/no:17258} Current diet: {diet habits:16563} Current exercise: {exercise types:16438}  Pertinent Labs:    Component Value Date/Time   CHOL 175 12/09/2017 1017   TRIG 334 (H) 12/09/2017 1017   HDL 31 (L) 12/09/2017 1017   LDLCALC 77 12/09/2017 1017   CREATININE 2.42 (H) 12/26/2017 1145    Wt Readings from Last 3 Encounters:  02/16/18 (!) 383 lb (173.7 kg)  01/24/18 (!) 383 lb 6.4 oz (173.9 kg)  01/16/18 (!) 371 lb (168.3 kg)    ------------------------------------------------------------------------   No Known Allergies   Current Outpatient Medications:  .  acetaminophen (TYLENOL) 325 MG tablet, Take 650 mg by mouth every 6 (six) hours as needed., Disp: , Rfl:  .  allopurinol (ZYLOPRIM) 100 MG tablet, TAKE 0.5 TABLETS (50 MG TOTAL) BY MOUTH DAILY., Disp: 15 tablet, Rfl: 3 .  ALPRAZolam (XANAX) 0.5 MG tablet, Take 1 tablet (0.5 mg total) by mouth 2 (two) times daily., Disp: 60 tablet, Rfl: 1 .  colchicine 0.6  MG tablet, TAKE 1 TABLET BY MOUTH EVERY DAY, Disp: 30 tablet, Rfl: 2 .  gabapentin (NEURONTIN) 600 MG tablet, Take 1 tablet (600 mg total) by mouth 2 (two) times daily., Disp: 180 tablet, Rfl: 3 .  glimepiride (AMARYL) 2 MG tablet, Take 1 tablet (2 mg total) by mouth daily with breakfast., Disp: 30 tablet, Rfl: 5 .  insulin detemir (LEVEMIR) 100 UNIT/ML injection, Inject 0.65 mLs (65 Units total) into the skin 2 (two) times daily., Disp: 40 mL, Rfl: 5 .  losartan (COZAAR) 100 MG tablet, Take 1 tablet (100 mg total) by mouth daily., Disp: 90 tablet, Rfl: 3 .  metoprolol (TOPROL-XL) 200 MG 24 hr tablet, Take 200 mg by mouth 2 (two) times daily., Disp: , Rfl: 0 .  NOVOLOG 100 UNIT/ML injection, Inject 35 Units into the skin 3 (three) times daily with meals. (Patient taking differently: Inject 40 Units into the skin 3 (three) times daily with meals. ), Disp: 40 mL, Rfl: 5 .  tiZANidine (ZANAFLEX) 4 MG tablet, Take 4 mg by mouth every 8 (eight) hours as needed for muscle spasms., Disp: , Rfl:  .  torsemide (DEMADEX) 20 MG tablet, Take 2 tablets (40 mg total) by mouth 2 (two) times daily., Disp: 120 tablet, Rfl: 3 .  warfarin (COUMADIN) 3 MG tablet, Take 1 tablet (3 mg total) by mouth daily at 6 PM. (Patient taking differently: Take 5 mg by mouth daily at 6 PM. ), Disp: 30 tablet, Rfl: 1  Review of Systems  Social History   Tobacco Use  .  Smoking status: Former Smoker    Packs/day: 0.50    Years: 20.00    Pack years: 10.00    Types: Cigarettes    Last attempt to quit: 04/25/2014    Years since quitting: 3.9  . Smokeless tobacco: Never Used  . Tobacco comment: quit around june 2015  Substance Use Topics  . Alcohol use: No    Frequency: Never   Objective:   There were no vitals taken for this visit. There were no vitals filed for this visit.   Physical Exam      Assessment & Plan:

## 2018-03-29 ENCOUNTER — Encounter: Payer: Self-pay | Admitting: Family Medicine

## 2018-03-29 ENCOUNTER — Ambulatory Visit (INDEPENDENT_AMBULATORY_CARE_PROVIDER_SITE_OTHER): Payer: Medicare HMO | Admitting: Family Medicine

## 2018-03-29 VITALS — BP 120/72 | HR 63 | Temp 98.0°F | Resp 20 | Wt 377.0 lb

## 2018-03-29 DIAGNOSIS — Z952 Presence of prosthetic heart valve: Secondary | ICD-10-CM | POA: Diagnosis not present

## 2018-03-29 DIAGNOSIS — I482 Chronic atrial fibrillation, unspecified: Secondary | ICD-10-CM

## 2018-03-29 DIAGNOSIS — I5032 Chronic diastolic (congestive) heart failure: Secondary | ICD-10-CM | POA: Diagnosis not present

## 2018-03-29 DIAGNOSIS — M1A3790 Chronic gout due to renal impairment, unspecified ankle and foot, without tophus (tophi): Secondary | ICD-10-CM | POA: Diagnosis not present

## 2018-03-29 DIAGNOSIS — E1165 Type 2 diabetes mellitus with hyperglycemia: Secondary | ICD-10-CM

## 2018-03-29 DIAGNOSIS — Z794 Long term (current) use of insulin: Secondary | ICD-10-CM

## 2018-03-29 DIAGNOSIS — R69 Illness, unspecified: Secondary | ICD-10-CM | POA: Diagnosis not present

## 2018-03-29 DIAGNOSIS — F4321 Adjustment disorder with depressed mood: Secondary | ICD-10-CM

## 2018-03-29 LAB — POCT GLYCOSYLATED HEMOGLOBIN (HGB A1C): HEMOGLOBIN A1C: 14 % — AB (ref 4.0–5.6)

## 2018-03-29 MED ORDER — METOLAZONE 2.5 MG PO TABS: 2.5000 mg | ORAL_TABLET | Freq: Two times a day (BID) | ORAL | 3 refills | Status: DC

## 2018-03-29 NOTE — Assessment & Plan Note (Signed)
Has appt sent up with psych next week Patient would likely benefit from counseling and SSRI Discussed safety precautions

## 2018-03-29 NOTE — Patient Instructions (Signed)
Adjustment Disorder, Adult Adjustment disorder is a group of symptoms that can develop after a stressful life event, such as the loss of a job or serious physical illness. The symptoms can affect how you feel, think, and act. They may interfere with your relationships. Adjustment disorder increases your risk of suicide and substance abuse. If this disorder is not managed early, it can develop into a more serious condition, such as major depressive disorder or post-traumatic stress disorder. What are the causes? This condition happens when you have trouble recovering from or coping with a stressful life event. What increases the risk? You are more likely to develop this condition if:  You have had depression or anxiety.  You are being treated for a long-term (chronic) illness.  You are being treated for an illness that cannot be cured (terminal illness).  You have a family history of mental illness.  What are the signs or symptoms? Symptoms of this condition include:  Extreme trouble doing daily tasks, such as going to work.  Sadness, depression, or crying spells.  Worrying a lot.  Loss of enjoyment.  Change in appetite or weight.  Feelings of loss or hopelessness.  Thoughts of suicide.  Anxiety, worry, or nervousness.  Trouble sleeping.  Avoiding family and friends.  Fighting or vandalism.  Complaining of feeling sick without being ill.  Feeling dazed or disconnected.  Nightmares.  Trouble sleeping.  Irritability.  Reckless driving.  Poor work performance.  Ignoring bills.  Symptoms of this condition start within three months of the stressful event. They do not last more than six months, unless the stressful circumstances last longer. Normal grieving after the death of a loved one is not a symptom of this condition. How is this diagnosed? To diagnose this condition, your health care provider will ask about what has happened in your life and how it has  affected you. He or she may also ask about your medical history and your use of medicines, alcohol, and other substances. Your health care provider may do a physical exam and order lab tests or other studies. You may be referred to a mental health specialist. How is this treated? Treatment options for this condition include:  Counseling or talk therapy. Talk therapy is usually provided by mental health specialists.  Medicines. Certain medicines may help with depression, anxiety, and sleep.  Support groups. These offer emotional support, advice, and guidance. They are made up of people who have had similar experiences.  Observation and time. This is sometimes called "watchful waiting." In this treatment, health care providers monitor your health and behavior without other treatment. Adjustment disorder sometimes gets better on its own with time.  Follow these instructions at home:  Take over-the-counter and prescription medicines only as told by your health care provider.  Keep all follow-up visits as told by your health care provider. This is important. Contact a health care provider if:  Your symptoms do not improve in six months.  Your symptoms get worse. Get help right away if:  You have serious thoughts about hurting yourself or someone else. If you ever feel like you may hurt yourself or others, or have thoughts about taking your own life, get help right away. You can go to your nearest emergency department or call:  Your local emergency services (911 in the [place]  A suicide crisis helpline, such as the National Suicide Prevention Lifeline at 1-800-273-8255. This is open 24 hours a day.  Summary  Adjustment disorder is a group of   symptoms that can develop after a stressful life event, such as the loss of a job or serious physical illness. The symptoms can affect how you feel, think, and act. They may interfere with your relationships.  Symptoms of this condition start within  three months of the stressful event. They do not last more than six months, unless the stressful circumstances last longer.  Treatment may include talk therapy, medicines, participation in a support group, or observation to see if symptoms improve.  Contact your health care provider if your symptoms get worse or do not improve in six months.  If you ever feel like you may hurt yourself or others, or have thoughts about taking your own life, get help right away. This information is not intended to replace advice given to you by your health care provider. Make sure you discuss any questions you have with your health care provider. Document Released: 06/29/2006 Document Revised: 12/24/2016 Document Reviewed: 12/24/2016 Elsevier Interactive Patient Education  2018 Elsevier Inc.  

## 2018-03-29 NOTE — Assessment & Plan Note (Signed)
Referral to another Cardiologist as above Anticoagulation managed by Cardiology

## 2018-03-29 NOTE — Assessment & Plan Note (Signed)
Uncontrolled with POC A1c >14 Reporting fair home CBGs, though, so will recheck with lab drawn A1c Continue current insulin regimen pending repeat A1c Discussed low carb diet Continue sulfonylurea Referral to Endocrinology given his very difficult to manage DM and high doses of insulin

## 2018-03-29 NOTE — Assessment & Plan Note (Signed)
Chronic, stable H/o RVR s/p failed ablation that lead to fluid overload and heart failure exacerbation Currently rate controlled Continue current meds, including anticoagulation Referral to new Cardiologist for second opinion

## 2018-03-29 NOTE — Progress Notes (Deleted)
Patient ID: Alexander Alexander, male    DOB: [birth year], 47 y.o.   MRN: [id number]  HPI  Alexander Alexander is a 47 y/o male with a history of atrial fibrillation, DM, HTN, CKD, sleep apnea, anxiety, previous tobacco use and chronic heart failure.   Echo report from 11/16/17 showed an EF of 45%.  Admitted 12/06/17 due to acute on chronic HF and AF. Cardiology consult obtained. Medications adjusted. Had subtherapeutic INR and he was bridged with lovenox. Discharged after 2 days. Admitted 11/16/17 due to HF exacerbation. Initially needed IV diuretics and then transitioned to oral diuretics. Cardiology consult obtained. Discharged the following day.   He presents today for a follow-up visit with a chief complaint of   Past Medical History:  Diagnosis Date  . Allergy   . Anxiety   . Arrhythmia   . Arthritis   . Atrial fibrillation (HCC)   . CHF (congestive heart failure) (HCC)   . CKD (chronic kidney disease)   . Diabetes mellitus without complication (HCC)   . Hyperlipidemia   . Hypertension   . Pancreatitis   . Sleep apnea    Past Surgical History:  Procedure Laterality Date  . ABLATION  08/01/2017  . MECHANICAL AORTIC VALVE REPLACEMENT     Family History  Problem Relation Age of Onset  . CAD Mother   . Hypertension Mother   . Hypertension Father   . Asthma Father   . Dementia Father   . Diabetes Brother   . Hypertension Brother   . Hypertension Brother   . Pancreatic cancer Paternal Aunt        several aunts and uncles with pancreatic cancer on paternal side  . Breast cancer Maternal Aunt   . Colon cancer Neg Hx   . Prostate cancer Neg Hx    Social History   Tobacco Use  . Smoking status: Former Smoker    Packs/day: 0.50    Years: 20.00    Pack years: 10.00    Types: Cigarettes    Last attempt to quit: 04/25/2014    Years since quitting: 3.9  . Smokeless tobacco: Never Used  . Tobacco comment: quit around june 2015  Substance Use Topics  . Alcohol use: No    Frequency:  Never   No Known Allergies   Review of Systems  Constitutional: Positive for fatigue. Negative for appetite change.  HENT: Negative for congestion, postnasal drip and sore throat.   Eyes: Negative.   Respiratory: Positive for shortness of breath. Negative for cough and chest tightness.   Cardiovascular: Negative for chest pain and leg swelling.  Gastrointestinal: Positive for abdominal distention. Negative for abdominal pain.  Endocrine: Negative.   Genitourinary: Negative.   Musculoskeletal: Positive for arthralgias (feet pain).  Skin: Negative.   Allergic/Immunologic: Negative.   Neurological: Positive for dizziness and numbness (in feet). Negative for light-headedness.  Hematological: Negative for adenopathy. Bruises/bleeds easily.  Psychiatric/Behavioral: Positive for sleep disturbance (not wearing CPAP). Negative for dysphoric mood. The patient is not nervous/anxious.      Physical Exam  Constitutional: He is oriented to person, place, and time. He appears well-developed and well-nourished.  HENT:  Head: Normocephalic and atraumatic.  Neck: Normal range of motion. Neck supple. JVD present.  Cardiovascular: Normal rate. An irregular rhythm present.  Pulmonary/Chest: Effort normal. He has no wheezes. He has no rales.  Abdominal: He exhibits distension. There is no tenderness.  Musculoskeletal: He exhibits no edema or tenderness.  Neurological: He is alert and oriented to  person, place, and time.  Skin: Skin is warm and dry.  Psychiatric: He has a normal mood and affect. His behavior is normal. Thought content normal.  Nursing note and vitals reviewed.   Assessment & Plan:  1: Chronic heart failure with preserved ejection fraction- - NYHA class III - moderately fluid overloaded today - weighing daily and has noticed a gradual weight gain. Instructed to call for an overnight weight gain of >2 pounds or a weekly weight gain of >5 pounds - weight  - not adding salt to his  food and has been trying to read food labels. Reviewed the importance of closely following a  sodium diet - drinking ~ 48-64 ounces of fluid daily  - sees cardiology at [facility] sometime February 2019 - BNP from 12/06/17 was 64.0   2: HTN- - BP on the low side today - advised to check it at home every few days - saw PCP Beryle Flock) on 03/29/18 - BMP from 12/26/17 reviewed and showed sodium 135, potassium 6.5 and GFR 35  3: Diabetes- - glucose in clinic was  - ate unsweetened applesauce in the middle of the night - [facility] and may also need endocrinology referral  4: Aortic valve replacement-  - INR from 12/08/17 was 1.24 - follows with cardiology Welton Flakes)  Patient did not bring his medications nor a list. Each medication was verbally reviewed with the patient and he was encouraged to bring the bottles to every visit to confirm accuracy of list.

## 2018-03-29 NOTE — Assessment & Plan Note (Signed)
2/2 CKD Doing well without flares since starting low dose allopurinol Will need to titrate slowly given CKD Cardiology needs to monitor INR closely while on allopurinol Continue colchicine daily for 6 months after Uric acid gets to goal (<6) Recheck BMP and uric acid level today

## 2018-03-29 NOTE — Assessment & Plan Note (Signed)
Managed by heart failure clinic He is euvolemic and doing well on new combo of diuretics Continue fluid restriction Discussed that with HFpEF and rate controlled afib, do not believe that his prognosis is so grim, but that diabetes being so uncontrolled does shorten life expectancy Hoping that cardiologist and heart failure clinic may be able to address this further with him

## 2018-03-29 NOTE — Progress Notes (Signed)
Patient: Alexander Alexander Male    DOB: 20-Apr-1971   46 y.o.   MRN: [id number] Visit Date: 03/29/2018  Today's Provider: Shirlee Latch, MD   I, Joslyn Hy, CMA, am acting as scribe for Shirlee Latch, MD.  Chief Complaint  Patient presents with  . Diabetes  . Gout   Subjective:    HPI      Diabetes Mellitus Type II, Follow-up:   Lab Results  Component Value Date   HGBA1C 13.9 (H) 12/09/2017    Last seen for diabetes 5 weeks ago.  Management since then includes continuing Levemir 65 units BID and Novolog 35-40 units with each meal. He reports good compliance with treatment. He is not having side effects.  Current symptoms include paresthesia of the feet and weight loss and have been stable on Gabapentin. Home blood sugar records: fasting range: 70-101 and postprandial range: up to 200  Episodes of hypoglycemia? no   Current Insulin Regimen: Levemir 65 units BID and Novolog 35-40 untis with each meal. Most Recent Eye Exam: March 2019 Weight trend: decreasing steadily Current diet: in general, a "healthy" diet   Current exercise: only exercise is going up and down stairs  Pertinent Labs:    Component Value Date/Time   CHOL 175 12/09/2017 1017   TRIG 334 (H) 12/09/2017 1017   HDL 31 (L) 12/09/2017 1017   LDLCALC 77 12/09/2017 1017   CREATININE 2.42 (H) 12/26/2017 1145    Wt Readings from Last 3 Encounters:  03/29/18 (!) 377 lb (171 kg)  02/16/18 (!) 383 lb (173.7 kg)  01/24/18 (!) 383 lb 6.4 oz (173.9 kg)    ------------------------------------------------------------------------  Follow up for Gout  The patient was last seen for this 5 weeks ago. Changes made at last visit include starting allopurinol and slowly increase dose. Pt was also advised to start colchicine 0.6 mg for at least 6 months while initiating prophylaxis.  He reports good compliance with treatment. He feels that condition is Improved. He is not having side effects.     ------------------------------------------------------------------------------------  Anxiety Pt's anxiety is also worsening. He states his cardiology PA (at [facility]) advised him that he only has 5 years to live due to CHF, which has been causing significant distress and suicidal ideation. States the SI is improved, as his wife and grandchild helped him out of this. Would like to be referred to a different cardiologist.  He has been seeing heart failure clinic for management of chronic diastolic CHF (HFpEF).  He was seeing [facility] for Afib with h/o RVR, aortic valve replacement.  He has an appt scheduled next week with a psychiatrist at [facility].    No Known Allergies   Current Outpatient Medications:  .  acetaminophen (TYLENOL) 325 MG tablet, Take 650 mg by mouth every 6 (six) hours as needed., Disp: , Rfl:  .  allopurinol (ZYLOPRIM) 100 MG tablet, TAKE 0.5 TABLETS (50 MG TOTAL) BY MOUTH DAILY., Disp: 15 tablet, Rfl: 3 .  ALPRAZolam (XANAX) 0.5 MG tablet, Take 1 tablet (0.5 mg total) by mouth 2 (two) times daily., Disp: 60 tablet, Rfl: 1 .  colchicine 0.6 MG tablet, TAKE 1 TABLET BY MOUTH EVERY DAY, Disp: 30 tablet, Rfl: 2 .  gabapentin (NEURONTIN) 600 MG tablet, Take 1 tablet (600 mg total) by mouth 2 (two) times daily., Disp: 180 tablet, Rfl: 3 .  glimepiride (AMARYL) 2 MG tablet, Take 1 tablet (2 mg total) by mouth daily with breakfast., Disp: 30 tablet, Rfl:  5 .  insulin detemir (LEVEMIR) 100 UNIT/ML injection, Inject 0.65 mLs (65 Units total) into the skin 2 (two) times daily., Disp: 40 mL, Rfl: 5 .  losartan (COZAAR) 100 MG tablet, Take 1 tablet (100 mg total) by mouth daily., Disp: 90 tablet, Rfl: 3 .  metolazone (ZAROXOLYN) 2.5 MG tablet, TAKE 1 TABLET BY MOUTH TWICE A DAY FOR 5 DAY PRIOR TO TORSEMIDE, Disp: , Rfl: 0 .  metoprolol (TOPROL-XL) 200 MG 24 hr tablet, Take 200 mg by mouth 2 (two) times daily., Disp: , Rfl: 0 .  NOVOLOG 100 UNIT/ML injection, Inject 35 Units into  the skin 3 (three) times daily with meals. (Patient taking differently: Inject 40 Units into the skin 3 (three) times daily with meals. ), Disp: 40 mL, Rfl: 5 .  tiZANidine (ZANAFLEX) 4 MG tablet, Take 4 mg by mouth every 8 (eight) hours as needed for muscle spasms., Disp: , Rfl:  .  torsemide (DEMADEX) 20 MG tablet, Take 2 tablets (40 mg total) by mouth 2 (two) times daily., Disp: 120 tablet, Rfl: 3 .  warfarin (COUMADIN) 3 MG tablet, Take 1 tablet (3 mg total) by mouth daily at 6 PM. (Patient taking differently: Take 5 mg by mouth daily at 6 PM. ), Disp: 30 tablet, Rfl: 1  Review of Systems  Constitutional: Positive for fatigue. Negative for activity change, appetite change, chills, diaphoresis, fever and unexpected weight change.  HENT: Negative.   Eyes: Negative for visual disturbance.  Respiratory: Negative.  Negative for shortness of breath.   Cardiovascular: Negative for chest pain, palpitations and leg swelling.  Endocrine: Negative for polydipsia, polyphagia and polyuria.  Genitourinary: Negative.   Musculoskeletal: Positive for arthralgias, back pain and gait problem. Negative for joint swelling and myalgias.  Skin: Negative.   Neurological: Negative for dizziness, tremors, seizures, syncope, facial asymmetry, speech difficulty, weakness, light-headedness, numbness and headaches.  Psychiatric/Behavioral: Positive for dysphoric mood, sleep disturbance and suicidal ideas. Negative for behavioral problems, confusion, hallucinations and self-injury. The patient is nervous/anxious. The patient is not hyperactive.     Social History   Tobacco Use  . Smoking status: Former Smoker    Packs/day: 0.50    Years: 20.00    Pack years: 10.00    Types: Cigarettes    Last attempt to quit: 04/25/2014    Years since quitting: 3.9  . Smokeless tobacco: Never Used  . Tobacco comment: quit around june 2015  Substance Use Topics  . Alcohol use: No    Frequency: Never   Objective:   BP 120/72  (BP Location: Left Arm, Patient Position: Sitting, Cuff Size: Large)   Pulse 63   Temp 98 F (36.7 C) (Oral)   Resp 20   Wt (!) 377 lb (171 kg)   SpO2 94%   BMI 65.16 kg/m  Vitals:   03/29/18 1401  BP: 120/72  Pulse: 63  Resp: 20  Temp: 98 F (36.7 C)  TempSrc: Oral  SpO2: 94%  Weight: (!) 377 lb (171 kg)     Physical Exam  Constitutional: He is oriented to person, place, and time. He appears well-developed and well-nourished. No distress.  HENT:  Head: Normocephalic and atraumatic.  Mouth/Throat: No oropharyngeal exudate.  Eyes: Conjunctivae are normal. No scleral icterus.  Neck: Neck supple. No thyromegaly present.  Cardiovascular: Normal rate, regular rhythm and intact distal pulses.  Murmur heard. Pulmonary/Chest: Effort normal and breath sounds normal. No respiratory distress. He has no wheezes. He has no rales.  Musculoskeletal: He  exhibits no edema or deformity.  Walks with cane  Lymphadenopathy:    He has no cervical adenopathy.  Neurological: He is alert and oriented to person, place, and time.  Skin: Skin is warm and dry. Capillary refill takes less than 2 seconds. No rash noted.  Psychiatric: He has a normal mood and affect. His behavior is normal.  Vitals reviewed.       Results for orders placed or performed in visit on 03/29/18  POCT glycosylated hemoglobin (Hb A1C)  Result Value Ref Range   Hemoglobin A1C 14.0 (A) 4.0 - 5.6 %    Assessment & Plan:     Problem List Items Addressed This Visit      Cardiovascular and Mediastinum   Atrial fibrillation (HCC) (Chronic)    Chronic, stable H/o RVR s/p failed ablation that lead to fluid overload and heart failure exacerbation Currently rate controlled Continue current meds, including anticoagulation Referral to new Cardiologist for second opinion      Relevant Medications   metolazone (ZAROXOLYN) 2.5 MG tablet   Other Relevant Orders   Ambulatory referral to Cardiology   Chronic diastolic heart  failure (HCC)    Managed by heart failure clinic He is euvolemic and doing well on new combo of diuretics Continue fluid restriction Discussed that with HFpEF and rate controlled afib, do not believe that his prognosis is so grim, but that diabetes being so uncontrolled does shorten life expectancy Hoping that cardiologist and heart failure clinic may be able to address this further with him      Relevant Medications   metolazone (ZAROXOLYN) 2.5 MG tablet   Other Relevant Orders   Ambulatory referral to Cardiology     Endocrine   T2DM (type 2 diabetes mellitus) (HCC) - Primary    Uncontrolled with POC A1c >14 Reporting fair home CBGs, though, so will recheck with lab drawn A1c Continue current insulin regimen pending repeat A1c Discussed low carb diet Continue sulfonylurea Referral to Endocrinology given his very difficult to manage DM and high doses of insulin      Relevant Orders   POCT glycosylated hemoglobin (Hb A1C) (Completed)   Hemoglobin A1c   Ambulatory referral to Endocrinology     Other   Aortic valve replaced (Chronic)    Referral to another Cardiologist as above Anticoagulation managed by Cardiology      Relevant Orders   Ambulatory referral to Cardiology   Gout    2/2 CKD Doing well without flares since starting low dose allopurinol Will need to titrate slowly given CKD Cardiology needs to monitor INR closely while on allopurinol Continue colchicine daily for 6 months after Uric acid gets to goal (<6) Recheck BMP and uric acid level today      Relevant Orders   Basic Metabolic Panel (BMET)   Uric acid   Adjustment disorder with depressed mood    Has appt sent up with psych next week Patient would likely benefit from counseling and SSRI Discussed safety precautions          Return in about 6 weeks (around 05/10/2018) for diabetes and gout f/u.   The entirety of the information documented in the History of Present Illness, Review of Systems and  Physical Exam were personally obtained by me. Portions of this information were initially documented by Irving Burton Ratchford, CMA and reviewed by me for thoroughness and accuracy.    Erasmo Downer, MD, MPH [facility] 03/29/2018 3:26 PM

## 2018-03-30 ENCOUNTER — Inpatient Hospital Stay: Payer: Medicare HMO | Admitting: Oncology

## 2018-03-30 ENCOUNTER — Inpatient Hospital Stay: Payer: Medicare HMO | Attending: Oncology

## 2018-03-30 ENCOUNTER — Telehealth: Payer: Self-pay

## 2018-03-30 ENCOUNTER — Ambulatory Visit: Payer: Medicare HMO | Admitting: Family

## 2018-03-30 LAB — BASIC METABOLIC PANEL
BUN/Creatinine Ratio: 12 (ref 9–20)
BUN: 24 mg/dL (ref 6–24)
CALCIUM: 10.3 mg/dL — AB (ref 8.7–10.2)
CHLORIDE: 97 mmol/L (ref 96–106)
CO2: 26 mmol/L (ref 20–29)
Creatinine, Ser: 1.93 mg/dL — ABNORMAL HIGH (ref 0.76–1.27)
GFR calc non Af Amer: 40 mL/min/{1.73_m2} — ABNORMAL LOW (ref >59)
GFR, EST AFRICAN AMERICAN: 46 mL/min/{1.73_m2} — AB (ref >59)
Glucose: 126 mg/dL — ABNORMAL HIGH (ref 65–99)
POTASSIUM: 4.5 mmol/L (ref 3.5–5.2)
Sodium: 142 mmol/L (ref 134–144)

## 2018-03-30 LAB — HEMOGLOBIN A1C
ESTIMATED AVERAGE GLUCOSE: 289 mg/dL
Hgb A1c MFr Bld: 11.7 % — ABNORMAL HIGH (ref 4.8–5.6)

## 2018-03-30 LAB — URIC ACID: URIC ACID: 10.2 mg/dL — AB (ref 3.7–8.6)

## 2018-03-30 NOTE — Telephone Encounter (Signed)
-----   Message from Erasmo Downer, MD sent at 03/30/2018  2:52 PM EDT ----- Blood sugar was much better than has been previously at 126.  Hemoglobin A1c was better than we thought as well at 11.7.  We are looking into why her machine read this so much higher.  This is still not at goal, which is less than 7, but this is not as bad as we thought it was.  We should go up on your mealtime insulin, however.  As you are currently taking up to 40 units of NovoLog with each meal, he should increase this to a maximum of 45 units with each meal.  Kidney function is actually better than it has been over the last 4 months.  Uric acid is slowly improving.  It has decreased from 11.4-10.2 with the allopurinol.  You can increase allopurinol 200 mg daily.  Continue the colchicine at current dose.  Okay to send a refill with a new dose if patient needs this.  Erasmo Downer, MD, MPH [facility] 03/30/2018 2:52 PM

## 2018-03-30 NOTE — Telephone Encounter (Signed)
Pt advised of results. He verbalizes understanding. He will call back if he needs refills on any medications.

## 2018-04-03 ENCOUNTER — Other Ambulatory Visit: Payer: Self-pay | Admitting: Family Medicine

## 2018-04-05 ENCOUNTER — Encounter: Payer: Self-pay | Admitting: Psychiatry

## 2018-04-05 ENCOUNTER — Telehealth: Payer: Self-pay | Admitting: Family Medicine

## 2018-04-05 ENCOUNTER — Other Ambulatory Visit: Payer: Self-pay

## 2018-04-05 ENCOUNTER — Ambulatory Visit: Payer: Medicare HMO | Admitting: Psychiatry

## 2018-04-05 VITALS — BP 132/98 | HR 114 | Temp 98.7°F

## 2018-04-05 DIAGNOSIS — R69 Illness, unspecified: Secondary | ICD-10-CM | POA: Diagnosis not present

## 2018-04-05 DIAGNOSIS — G8929 Other chronic pain: Secondary | ICD-10-CM

## 2018-04-05 DIAGNOSIS — F411 Generalized anxiety disorder: Secondary | ICD-10-CM | POA: Diagnosis not present

## 2018-04-05 DIAGNOSIS — F331 Major depressive disorder, recurrent, moderate: Secondary | ICD-10-CM | POA: Diagnosis not present

## 2018-04-05 DIAGNOSIS — F4312 Post-traumatic stress disorder, chronic: Secondary | ICD-10-CM | POA: Diagnosis not present

## 2018-04-05 DIAGNOSIS — F1421 Cocaine dependence, in remission: Secondary | ICD-10-CM | POA: Diagnosis not present

## 2018-04-05 DIAGNOSIS — F410 Panic disorder [episodic paroxysmal anxiety] without agoraphobia: Secondary | ICD-10-CM | POA: Diagnosis not present

## 2018-04-05 DIAGNOSIS — M5442 Lumbago with sciatica, left side: Principal | ICD-10-CM

## 2018-04-05 DIAGNOSIS — M5441 Lumbago with sciatica, right side: Principal | ICD-10-CM

## 2018-04-05 MED ORDER — FLUVOXAMINE MALEATE 25 MG PO TABS: 25.0000 mg | ORAL_TABLET | Freq: Every day | ORAL | 0 refills | Status: AC

## 2018-04-05 MED ORDER — FLUVOXAMINE MALEATE 25 MG PO TABS: 25.0000 mg | ORAL_TABLET | Freq: Every day | ORAL | 0 refills | Status: DC

## 2018-04-05 NOTE — Telephone Encounter (Signed)
Pt called stating that his psychiatrist suggest that a referral to pain clinic for his sciaticia may be beneficial for pt

## 2018-04-05 NOTE — Telephone Encounter (Signed)
Please review
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
## 2018-04-05 NOTE — Patient Instructions (Signed)
Fluvoxamine tablets What is this medicine? FLUVOXAMINE (floo VOX a meen) is an antidepressant. It is used to treat obsessive-compulsive disorder. This medicine may be used for other purposes; ask your health care provider or pharmacist if you have questions. COMMON BRAND NAME(S): Luvox What should I tell my health care provider before I take this medicine? They need to know if you have any of these conditions: -bipolar disorder or a family history of bipolar disorder -bleeding disorders -glaucoma -heart disease -liver disease -low levels of sodium in the blood -seizures -suicidal thoughts, plans, or attempt; a previous suicide attempt by you or a family member -take MAOIs like Carbex, Eldepryl, Marplan, Nardil, and Parnate -take medicines that treat or prevent blood clots -thyroid disease -an unusual or allergic reaction to fluvoxamine, other medicines, foods, dyes, or preservatives -pregnant or trying to get pregnant -breast-feeding How should I use this medicine? Take this medicine by mouth with a glass of water. Follow the directions on the prescription label. You can take this medicine with or without food. Take your doses at regular intervals. Do not take your medicine more often than directed. Do not stop taking this medicine suddenly except upon the advice of your doctor. Stopping this medicine too quickly may cause serious side effects or your condition may worsen. A special MedGuide will be given to you by the pharmacist with each prescription and refill. Be sure to read this information carefully each time. Talk to your pediatrician regarding the use of this medicine in children. While this drug may be prescribed for children as young as 8 years for selected conditions, precautions do apply. Overdosage: If you think you have taken too much of this medicine contact a poison control center or emergency room at once. NOTE: This medicine is only for you. Do not share this medicine  with others. What if I miss a dose? If you miss a dose, take it as soon as you can. If it is almost time for your next dose, take only that dose. Do not take double or extra doses. What may interact with this medicine? Do not take this medicine with any of the following medications: -alosetron -cisapride -linezolid -MAOIs like Carbex, Eldepryl, Marplan, Nardil, and Parnate -methylene blue (injected into a vein) -pimozide -thioridazine -tizanidine This medicine may also interact with the following medications: -alcohol -amphetamines -aspirin and aspirin-like medicines -certain medicines for depression, anxiety, or psychotic disturbances -certain medicines for migraine headache like almotriptan, eletriptan, frovatriptan, naratriptan, rizatriptan, sumatriptan, zolmitriptan -certain medicines for seizures like carbamazepine and phenytoin -clozapine -diltiazem -diuretics -fentanyl -furazolidone -isoniazid -lithium -medicines that treat or prevent blood clots like warfarin, enoxaparin, and dalteparin -medicines for sleep -methadone -metoprolol -mexiletine -NSAIDS, medicines for pain and inflammation, like ibuprofen or naproxen -omeprazole -procarbazine -propranolol -quinidine -ramelteon -rasagiline -supplements like St. John's wort, kava kava, valerian -tacrine -theophylline -tramadol -tryptophan This list may not describe all possible interactions. Give your health care provider a list of all the medicines, herbs, non-prescription drugs, or dietary supplements you use. Also tell them if you smoke, drink alcohol, or use illegal drugs. Some items may interact with your medicine. What should I watch for while using this medicine? Tell your doctor if your symptoms do not get better or if they get worse. Visit your doctor or health care professional for regular checks on your progress. Because it may take several weeks to see the full effects of this medicine, it is important to  continue your treatment as prescribed by your doctor. Patients and their   families should watch out for new or worsening thoughts of suicide or depression. Also watch out for sudden changes in feelings such as feeling anxious, agitated, panicky, irritable, hostile, aggressive, impulsive, severely restless, overly excited and hyperactive, or not being able to sleep. If this happens, especially at the beginning of treatment or after a change in dose, call your health care professional. You may get drowsy or dizzy. Do not drive, use machinery, or do anything that needs mental alertness until you know how this medicine affects you. Do not stand or sit up quickly, especially if you are an older patient. This reduces the risk of dizzy or fainting spells. Alcohol may interfere with the effect of this medicine. Avoid alcoholic drinks. Your mouth may get dry. Chewing sugarless gum or sucking hard candy, and drinking plenty of water may help. Contact your doctor if the problem does not go away or is severe. What side effects may I notice from receiving this medicine? Side effects that you should report to your doctor or health care professional as soon as possible: -allergic reactions like skin rash, itching or hives, swelling of the face, lips, or tongue -anxious -black, tarry stools -changes in vision -confusion -elevated mood, decreased need for sleep, racing thoughts, impulsive behavior -eye pain -fast, irregular heartbeat -feeling faint or lightheaded, falls -feeling agitated, angry, or irritable -hallucination, loss of contact with reality -loss of balance or coordination -loss of memory -painful or prolonged erections -restlessness, pacing, inability to keep still -seizures -stiff muscles -suicidal thoughts or other mood changes -trouble sleeping -unusual bleeding or bruising -unusually weak or tired -vomiting Side effects that usually do not require medical attention (report to your doctor  or health care professional if they continue or are bothersome): -change in appetite or weight -change in sex drive or performance -headache -increased sweating -indigestion, nausea -tremors This list may not describe all possible side effects. Call your doctor for medical advice about side effects. You may report side effects to FDA at 1-800-FDA-1088. Where should I keep my medicine? Keep out of the reach of children. Store at room temperature between 15 and 30 degrees C (59 and 86 degrees F). Protect from humidity. Keep container tightly closed. Throw away any unused medicine after the expiration date. NOTE: This sheet is a summary. It may not cover all possible information. If you have questions about this medicine, talk to your doctor, pharmacist, or health care provider.  2018 Elsevier/Gold Standard (2016-03-27 16:12:30)  

## 2018-04-06 ENCOUNTER — Ambulatory Visit: Payer: Medicare HMO | Admitting: Family

## 2018-04-06 DIAGNOSIS — G8929 Other chronic pain: Secondary | ICD-10-CM | POA: Insufficient documentation

## 2018-04-06 DIAGNOSIS — M549 Dorsalgia, unspecified: Secondary | ICD-10-CM

## 2018-04-06 NOTE — Telephone Encounter (Signed)
Referral placed to pain management  Leonard Feigel, Marzella Schlein, MD, MPH [facility] 04/06/2018 9:49 AM

## 2018-04-07 ENCOUNTER — Inpatient Hospital Stay: Payer: Medicare HMO

## 2018-04-07 ENCOUNTER — Inpatient Hospital Stay
Admission: EM | Admit: 2018-04-07 | Discharge: 2018-05-26 | DRG: 004 | Disposition: A | Discharge status: Home Health | Payer: Medicare HMO | Attending: Internal Medicine | Admitting: Internal Medicine

## 2018-04-07 ENCOUNTER — Inpatient Hospital Stay
Admit: 2018-04-07 | Discharge: 2018-04-07 | Disposition: A | Discharge status: Home / Self Care | Payer: Medicare HMO | Attending: Internal Medicine | Admitting: Internal Medicine

## 2018-04-07 ENCOUNTER — Emergency Department: Payer: Medicare HMO

## 2018-04-07 ENCOUNTER — Ambulatory Visit: Payer: Self-pay

## 2018-04-07 ENCOUNTER — Ambulatory Visit: Payer: Medicare HMO | Admitting: Family

## 2018-04-07 DIAGNOSIS — I482 Chronic atrial fibrillation: Secondary | ICD-10-CM | POA: Diagnosis present

## 2018-04-07 DIAGNOSIS — R140 Abdominal distension (gaseous): Secondary | ICD-10-CM | POA: Diagnosis not present

## 2018-04-07 DIAGNOSIS — T41295A Adverse effect of other general anesthetics, initial encounter: Secondary | ICD-10-CM | POA: Diagnosis not present

## 2018-04-07 DIAGNOSIS — R001 Bradycardia, unspecified: Secondary | ICD-10-CM | POA: Diagnosis not present

## 2018-04-07 DIAGNOSIS — J90 Pleural effusion, not elsewhere classified: Secondary | ICD-10-CM | POA: Diagnosis not present

## 2018-04-07 DIAGNOSIS — Z931 Gastrostomy status: Secondary | ICD-10-CM | POA: Diagnosis not present

## 2018-04-07 DIAGNOSIS — E119 Type 2 diabetes mellitus without complications: Secondary | ICD-10-CM

## 2018-04-07 DIAGNOSIS — R601 Generalized edema: Secondary | ICD-10-CM | POA: Diagnosis not present

## 2018-04-07 DIAGNOSIS — R932 Abnormal findings on diagnostic imaging of liver and biliary tract: Secondary | ICD-10-CM | POA: Diagnosis not present

## 2018-04-07 DIAGNOSIS — E1122 Type 2 diabetes mellitus with diabetic chronic kidney disease: Secondary | ICD-10-CM | POA: Diagnosis present

## 2018-04-07 DIAGNOSIS — Y848 Other medical procedures as the cause of abnormal reaction of the patient, or of later complication, without mention of misadventure at the time of the procedure: Secondary | ICD-10-CM | POA: Diagnosis not present

## 2018-04-07 DIAGNOSIS — Z4659 Encounter for fitting and adjustment of other gastrointestinal appliance and device: Secondary | ICD-10-CM

## 2018-04-07 DIAGNOSIS — N189 Chronic kidney disease, unspecified: Secondary | ICD-10-CM | POA: Diagnosis not present

## 2018-04-07 DIAGNOSIS — Z01818 Encounter for other preprocedural examination: Secondary | ICD-10-CM

## 2018-04-07 DIAGNOSIS — I499 Cardiac arrhythmia, unspecified: Secondary | ICD-10-CM | POA: Diagnosis not present

## 2018-04-07 DIAGNOSIS — R69 Illness, unspecified: Secondary | ICD-10-CM | POA: Diagnosis not present

## 2018-04-07 DIAGNOSIS — Z431 Encounter for attention to gastrostomy: Secondary | ICD-10-CM | POA: Diagnosis not present

## 2018-04-07 DIAGNOSIS — E870 Hyperosmolality and hypernatremia: Secondary | ICD-10-CM | POA: Diagnosis not present

## 2018-04-07 DIAGNOSIS — I10 Essential (primary) hypertension: Secondary | ICD-10-CM | POA: Diagnosis present

## 2018-04-07 DIAGNOSIS — E8881 Metabolic syndrome: Secondary | ICD-10-CM | POA: Diagnosis present

## 2018-04-07 DIAGNOSIS — J9811 Atelectasis: Secondary | ICD-10-CM | POA: Diagnosis not present

## 2018-04-07 DIAGNOSIS — G4733 Obstructive sleep apnea (adult) (pediatric): Secondary | ICD-10-CM | POA: Diagnosis present

## 2018-04-07 DIAGNOSIS — I472 Ventricular tachycardia, unspecified: Secondary | ICD-10-CM

## 2018-04-07 DIAGNOSIS — E875 Hyperkalemia: Secondary | ICD-10-CM | POA: Diagnosis not present

## 2018-04-07 DIAGNOSIS — Z87891 Personal history of nicotine dependence: Secondary | ICD-10-CM

## 2018-04-07 DIAGNOSIS — J15212 Pneumonia due to Methicillin resistant Staphylococcus aureus: Secondary | ICD-10-CM | POA: Diagnosis not present

## 2018-04-07 DIAGNOSIS — Z6841 Body Mass Index (BMI) 40.0 and over, adult: Secondary | ICD-10-CM

## 2018-04-07 DIAGNOSIS — I82492 Acute embolism and thrombosis of other specified deep vein of left lower extremity: Secondary | ICD-10-CM | POA: Diagnosis not present

## 2018-04-07 DIAGNOSIS — J9601 Acute respiratory failure with hypoxia: Secondary | ICD-10-CM | POA: Diagnosis not present

## 2018-04-07 DIAGNOSIS — I4901 Ventricular fibrillation: Secondary | ICD-10-CM | POA: Diagnosis present

## 2018-04-07 DIAGNOSIS — I4891 Unspecified atrial fibrillation: Secondary | ICD-10-CM | POA: Diagnosis not present

## 2018-04-07 DIAGNOSIS — I824Z2 Acute embolism and thrombosis of unspecified deep veins of left distal lower extremity: Secondary | ICD-10-CM | POA: Diagnosis not present

## 2018-04-07 DIAGNOSIS — I462 Cardiac arrest due to underlying cardiac condition: Secondary | ICD-10-CM | POA: Diagnosis present

## 2018-04-07 DIAGNOSIS — M25511 Pain in right shoulder: Secondary | ICD-10-CM | POA: Diagnosis not present

## 2018-04-07 DIAGNOSIS — I517 Cardiomegaly: Secondary | ICD-10-CM | POA: Diagnosis not present

## 2018-04-07 DIAGNOSIS — I471 Supraventricular tachycardia: Secondary | ICD-10-CM | POA: Diagnosis present

## 2018-04-07 DIAGNOSIS — N179 Acute kidney failure, unspecified: Secondary | ICD-10-CM | POA: Diagnosis not present

## 2018-04-07 DIAGNOSIS — E1165 Type 2 diabetes mellitus with hyperglycemia: Secondary | ICD-10-CM | POA: Diagnosis not present

## 2018-04-07 DIAGNOSIS — Z978 Presence of other specified devices: Secondary | ICD-10-CM | POA: Diagnosis not present

## 2018-04-07 DIAGNOSIS — G9341 Metabolic encephalopathy: Secondary | ICD-10-CM | POA: Diagnosis not present

## 2018-04-07 DIAGNOSIS — M5440 Lumbago with sciatica, unspecified side: Secondary | ICD-10-CM | POA: Diagnosis present

## 2018-04-07 DIAGNOSIS — J9622 Acute and chronic respiratory failure with hypercapnia: Secondary | ICD-10-CM | POA: Diagnosis present

## 2018-04-07 DIAGNOSIS — A419 Sepsis, unspecified organism: Secondary | ICD-10-CM | POA: Diagnosis not present

## 2018-04-07 DIAGNOSIS — J9690 Respiratory failure, unspecified, unspecified whether with hypoxia or hypercapnia: Secondary | ICD-10-CM | POA: Diagnosis not present

## 2018-04-07 DIAGNOSIS — J9600 Acute respiratory failure, unspecified whether with hypoxia or hypercapnia: Secondary | ICD-10-CM

## 2018-04-07 DIAGNOSIS — R918 Other nonspecific abnormal finding of lung field: Secondary | ICD-10-CM | POA: Diagnosis not present

## 2018-04-07 DIAGNOSIS — I952 Hypotension due to drugs: Secondary | ICD-10-CM | POA: Diagnosis not present

## 2018-04-07 DIAGNOSIS — M109 Gout, unspecified: Secondary | ICD-10-CM | POA: Diagnosis present

## 2018-04-07 DIAGNOSIS — R0600 Dyspnea, unspecified: Secondary | ICD-10-CM | POA: Diagnosis not present

## 2018-04-07 DIAGNOSIS — E1142 Type 2 diabetes mellitus with diabetic polyneuropathy: Secondary | ICD-10-CM | POA: Diagnosis present

## 2018-04-07 DIAGNOSIS — R6521 Severe sepsis with septic shock: Secondary | ICD-10-CM | POA: Diagnosis not present

## 2018-04-07 DIAGNOSIS — Z95828 Presence of other vascular implants and grafts: Secondary | ICD-10-CM

## 2018-04-07 DIAGNOSIS — Z4682 Encounter for fitting and adjustment of non-vascular catheter: Secondary | ICD-10-CM | POA: Diagnosis not present

## 2018-04-07 DIAGNOSIS — Z452 Encounter for adjustment and management of vascular access device: Secondary | ICD-10-CM | POA: Diagnosis not present

## 2018-04-07 DIAGNOSIS — J95851 Ventilator associated pneumonia: Secondary | ICD-10-CM | POA: Diagnosis not present

## 2018-04-07 DIAGNOSIS — Z930 Tracheostomy status: Secondary | ICD-10-CM | POA: Diagnosis not present

## 2018-04-07 DIAGNOSIS — J9621 Acute and chronic respiratory failure with hypoxia: Secondary | ICD-10-CM | POA: Diagnosis present

## 2018-04-07 DIAGNOSIS — I469 Cardiac arrest, cause unspecified: Secondary | ICD-10-CM

## 2018-04-07 DIAGNOSIS — R0602 Shortness of breath: Secondary | ICD-10-CM | POA: Diagnosis not present

## 2018-04-07 DIAGNOSIS — M79621 Pain in right upper arm: Secondary | ICD-10-CM | POA: Diagnosis not present

## 2018-04-07 DIAGNOSIS — D649 Anemia, unspecified: Secondary | ICD-10-CM | POA: Diagnosis present

## 2018-04-07 DIAGNOSIS — D689 Coagulation defect, unspecified: Secondary | ICD-10-CM | POA: Diagnosis present

## 2018-04-07 DIAGNOSIS — E785 Hyperlipidemia, unspecified: Secondary | ICD-10-CM

## 2018-04-07 DIAGNOSIS — E1110 Type 2 diabetes mellitus with ketoacidosis without coma: Secondary | ICD-10-CM | POA: Diagnosis not present

## 2018-04-07 DIAGNOSIS — F419 Anxiety disorder, unspecified: Secondary | ICD-10-CM | POA: Diagnosis present

## 2018-04-07 DIAGNOSIS — R609 Edema, unspecified: Secondary | ICD-10-CM

## 2018-04-07 DIAGNOSIS — R509 Fever, unspecified: Secondary | ICD-10-CM

## 2018-04-07 DIAGNOSIS — Z430 Encounter for attention to tracheostomy: Secondary | ICD-10-CM

## 2018-04-07 DIAGNOSIS — I130 Hypertensive heart and chronic kidney disease with heart failure and stage 1 through stage 4 chronic kidney disease, or unspecified chronic kidney disease: Secondary | ICD-10-CM | POA: Diagnosis present

## 2018-04-07 DIAGNOSIS — R1310 Dysphagia, unspecified: Secondary | ICD-10-CM | POA: Diagnosis not present

## 2018-04-07 DIAGNOSIS — J181 Lobar pneumonia, unspecified organism: Secondary | ICD-10-CM | POA: Diagnosis not present

## 2018-04-07 DIAGNOSIS — T82524A Displacement of infusion catheter, initial encounter: Secondary | ICD-10-CM

## 2018-04-07 DIAGNOSIS — N170 Acute kidney failure with tubular necrosis: Secondary | ICD-10-CM | POA: Diagnosis not present

## 2018-04-07 DIAGNOSIS — R7989 Other specified abnormal findings of blood chemistry: Secondary | ICD-10-CM | POA: Diagnosis not present

## 2018-04-07 DIAGNOSIS — I481 Persistent atrial fibrillation: Secondary | ICD-10-CM | POA: Diagnosis not present

## 2018-04-07 DIAGNOSIS — Z794 Long term (current) use of insulin: Secondary | ICD-10-CM | POA: Diagnosis not present

## 2018-04-07 DIAGNOSIS — L8990 Pressure ulcer of unspecified site, unspecified stage: Secondary | ICD-10-CM

## 2018-04-07 DIAGNOSIS — I959 Hypotension, unspecified: Secondary | ICD-10-CM | POA: Diagnosis not present

## 2018-04-07 DIAGNOSIS — Z9911 Dependence on respirator [ventilator] status: Secondary | ICD-10-CM

## 2018-04-07 DIAGNOSIS — B9562 Methicillin resistant Staphylococcus aureus infection as the cause of diseases classified elsewhere: Secondary | ICD-10-CM | POA: Diagnosis not present

## 2018-04-07 DIAGNOSIS — E782 Mixed hyperlipidemia: Secondary | ICD-10-CM | POA: Diagnosis present

## 2018-04-07 DIAGNOSIS — J189 Pneumonia, unspecified organism: Secondary | ICD-10-CM | POA: Diagnosis not present

## 2018-04-07 DIAGNOSIS — J9611 Chronic respiratory failure with hypoxia: Secondary | ICD-10-CM | POA: Diagnosis not present

## 2018-04-07 DIAGNOSIS — Z825 Family history of asthma and other chronic lower respiratory diseases: Secondary | ICD-10-CM

## 2018-04-07 DIAGNOSIS — R4182 Altered mental status, unspecified: Secondary | ICD-10-CM | POA: Diagnosis not present

## 2018-04-07 DIAGNOSIS — R0989 Other specified symptoms and signs involving the circulatory and respiratory systems: Secondary | ICD-10-CM | POA: Diagnosis not present

## 2018-04-07 DIAGNOSIS — R112 Nausea with vomiting, unspecified: Secondary | ICD-10-CM | POA: Diagnosis not present

## 2018-04-07 DIAGNOSIS — Y92239 Unspecified place in hospital as the place of occurrence of the external cause: Secondary | ICD-10-CM | POA: Diagnosis not present

## 2018-04-07 DIAGNOSIS — T8189XA Other complications of procedures, not elsewhere classified, initial encounter: Secondary | ICD-10-CM | POA: Diagnosis not present

## 2018-04-07 DIAGNOSIS — Z952 Presence of prosthetic heart valve: Secondary | ICD-10-CM | POA: Diagnosis not present

## 2018-04-07 DIAGNOSIS — I5042 Chronic combined systolic (congestive) and diastolic (congestive) heart failure: Secondary | ICD-10-CM | POA: Diagnosis not present

## 2018-04-07 DIAGNOSIS — J9610 Chronic respiratory failure, unspecified whether with hypoxia or hypercapnia: Secondary | ICD-10-CM | POA: Diagnosis not present

## 2018-04-07 DIAGNOSIS — R296 Repeated falls: Secondary | ICD-10-CM | POA: Diagnosis present

## 2018-04-07 DIAGNOSIS — E1169 Type 2 diabetes mellitus with other specified complication: Secondary | ICD-10-CM | POA: Diagnosis present

## 2018-04-07 DIAGNOSIS — Z8249 Family history of ischemic heart disease and other diseases of the circulatory system: Secondary | ICD-10-CM

## 2018-04-07 DIAGNOSIS — T85598A Other mechanical complication of other gastrointestinal prosthetic devices, implants and grafts, initial encounter: Secondary | ICD-10-CM | POA: Diagnosis not present

## 2018-04-07 DIAGNOSIS — Z9981 Dependence on supplemental oxygen: Secondary | ICD-10-CM

## 2018-04-07 DIAGNOSIS — I350 Nonrheumatic aortic (valve) stenosis: Secondary | ICD-10-CM | POA: Diagnosis not present

## 2018-04-07 DIAGNOSIS — N183 Chronic kidney disease, stage 3 unspecified: Secondary | ICD-10-CM | POA: Diagnosis present

## 2018-04-07 DIAGNOSIS — R52 Pain, unspecified: Secondary | ICD-10-CM

## 2018-04-07 DIAGNOSIS — Z833 Family history of diabetes mellitus: Secondary | ICD-10-CM

## 2018-04-07 DIAGNOSIS — Z0189 Encounter for other specified special examinations: Secondary | ICD-10-CM

## 2018-04-07 DIAGNOSIS — Z7901 Long term (current) use of anticoagulants: Secondary | ICD-10-CM

## 2018-04-07 DIAGNOSIS — I5043 Acute on chronic combined systolic (congestive) and diastolic (congestive) heart failure: Secondary | ICD-10-CM | POA: Diagnosis not present

## 2018-04-07 DIAGNOSIS — T82528A Displacement of other cardiac and vascular devices and implants, initial encounter: Secondary | ICD-10-CM

## 2018-04-07 DIAGNOSIS — T17908A Unspecified foreign body in respiratory tract, part unspecified causing other injury, initial encounter: Secondary | ICD-10-CM

## 2018-04-07 DIAGNOSIS — G9340 Encephalopathy, unspecified: Secondary | ICD-10-CM | POA: Diagnosis not present

## 2018-04-07 LAB — CBC
HCT: 54.7 % — ABNORMAL HIGH (ref 40.0–52.0)
Hemoglobin: 18.2 g/dL — ABNORMAL HIGH (ref 13.0–18.0)
MCH: 28.7 pg (ref 26.0–34.0)
MCHC: 33.2 g/dL (ref 32.0–36.0)
MCV: 86.4 fL (ref 80.0–100.0)
PLATELETS: 231 10*3/uL (ref 150–440)
RBC: 6.33 MIL/uL — ABNORMAL HIGH (ref 4.40–5.90)
RDW: 18.9 % — AB (ref 11.5–14.5)
WBC: 9.4 10*3/uL (ref 3.8–10.6)

## 2018-04-07 LAB — LACTIC ACID, PLASMA
LACTIC ACID, VENOUS: 2.4 mmol/L — AB (ref 0.5–1.9)
Lactic Acid, Venous: 2.9 mmol/L (ref 0.5–1.9)

## 2018-04-07 LAB — GLUCOSE, CAPILLARY
GLUCOSE-CAPILLARY: 276 mg/dL — AB (ref 65–99)
GLUCOSE-CAPILLARY: 393 mg/dL — AB (ref 65–99)
GLUCOSE-CAPILLARY: 396 mg/dL — AB (ref 65–99)
GLUCOSE-CAPILLARY: 467 mg/dL — AB (ref 65–99)
GLUCOSE-CAPILLARY: 492 mg/dL — AB (ref 65–99)
GLUCOSE-CAPILLARY: 502 mg/dL — AB (ref 65–99)
GLUCOSE-CAPILLARY: 516 mg/dL — AB (ref 65–99)
Glucose-Capillary: 449 mg/dL — ABNORMAL HIGH (ref 65–99)
Glucose-Capillary: 475 mg/dL — ABNORMAL HIGH (ref 65–99)
Glucose-Capillary: 400 mg/dL — ABNORMAL HIGH (ref 65–99)
Glucose-Capillary: 312 mg/dL — ABNORMAL HIGH (ref 65–99)
Glucose-Capillary: 308 mg/dL — ABNORMAL HIGH (ref 65–99)

## 2018-04-07 LAB — BASIC METABOLIC PANEL
ANION GAP: 14 (ref 5–15)
ANION GAP: 15 (ref 5–15)
ANION GAP: 15 (ref 5–15)
Anion gap: 17 — ABNORMAL HIGH (ref 5–15)
BUN: 62 mg/dL — AB (ref 6–20)
BUN: 67 mg/dL — AB (ref 6–20)
BUN: 54 mg/dL — AB (ref 6–20)
BUN: 52 mg/dL — AB (ref 6–20)
CHLORIDE: 90 mmol/L — AB (ref 101–111)
CHLORIDE: 89 mmol/L — AB (ref 101–111)
CHLORIDE: 93 mmol/L — AB (ref 101–111)
CHLORIDE: 95 mmol/L — AB (ref 101–111)
CO2: 27 mmol/L (ref 22–32)
CO2: 30 mmol/L (ref 22–32)
CO2: 30 mmol/L (ref 22–32)
CO2: 29 mmol/L (ref 22–32)
Calcium: 7.9 mg/dL — ABNORMAL LOW (ref 8.9–10.3)
Calcium: 8.1 mg/dL — ABNORMAL LOW (ref 8.9–10.3)
Calcium: 8.2 mg/dL — ABNORMAL LOW (ref 8.9–10.3)
Calcium: 8.3 mg/dL — ABNORMAL LOW (ref 8.9–10.3)
Creatinine, Ser: 2.77 mg/dL — ABNORMAL HIGH (ref 0.61–1.24)
Creatinine, Ser: 2.71 mg/dL — ABNORMAL HIGH (ref 0.61–1.24)
Creatinine, Ser: 2.44 mg/dL — ABNORMAL HIGH (ref 0.61–1.24)
Creatinine, Ser: 2.48 mg/dL — ABNORMAL HIGH (ref 0.61–1.24)
GFR calc Af Amer: 30 mL/min — ABNORMAL LOW (ref >60)
GFR calc Af Amer: 30 mL/min — ABNORMAL LOW (ref >60)
GFR calc Af Amer: 34 mL/min — ABNORMAL LOW (ref >60)
GFR calc non Af Amer: 25 mL/min — ABNORMAL LOW (ref >60)
GFR calc non Af Amer: 26 mL/min — ABNORMAL LOW (ref >60)
GFR calc non Af Amer: 30 mL/min — ABNORMAL LOW (ref >60)
GFR, EST AFRICAN AMERICAN: 34 mL/min — AB (ref >60)
GFR, EST NON AFRICAN AMERICAN: 29 mL/min — AB (ref >60)
GLUCOSE: 463 mg/dL — AB (ref 65–99)
Glucose, Bld: 475 mg/dL — ABNORMAL HIGH (ref 65–99)
Glucose, Bld: 360 mg/dL — ABNORMAL HIGH (ref 65–99)
Glucose, Bld: 286 mg/dL — ABNORMAL HIGH (ref 65–99)
POTASSIUM: 4.5 mmol/L (ref 3.5–5.1)
POTASSIUM: 4.6 mmol/L (ref 3.5–5.1)
POTASSIUM: 3.7 mmol/L (ref 3.5–5.1)
POTASSIUM: 3.5 mmol/L (ref 3.5–5.1)
SODIUM: 138 mmol/L (ref 135–145)
Sodium: 134 mmol/L — ABNORMAL LOW (ref 135–145)
Sodium: 133 mmol/L — ABNORMAL LOW (ref 135–145)
Sodium: 139 mmol/L (ref 135–145)

## 2018-04-07 LAB — BLOOD GAS, ARTERIAL
Acid-Base Excess: 2.6 mmol/L — ABNORMAL HIGH (ref 0.0–2.0)
Bicarbonate: 30.2 mmol/L — ABNORMAL HIGH (ref 20.0–28.0)
FIO2: 100
MECHANICAL RATE: 18
O2 SAT: 96.2 %
PEEP/CPAP: 5 cmH2O
Patient temperature: 37
VT: 550 mL
pCO2 arterial: 56 mmHg — ABNORMAL HIGH (ref 32.0–48.0)
pH, Arterial: 7.34 — ABNORMAL LOW (ref 7.350–7.450)
pO2, Arterial: 88 mmHg (ref 83.0–108.0)

## 2018-04-07 LAB — COMPREHENSIVE METABOLIC PANEL
ALT: 42 U/L (ref 17–63)
AST: 67 U/L — ABNORMAL HIGH (ref 15–41)
Albumin: 4.1 g/dL (ref 3.5–5.0)
Alkaline Phosphatase: 84 U/L (ref 38–126)
Anion gap: 18 — ABNORMAL HIGH (ref 5–15)
BUN: 63 mg/dL — ABNORMAL HIGH (ref 6–20)
CHLORIDE: 91 mmol/L — AB (ref 101–111)
CO2: 29 mmol/L (ref 22–32)
CREATININE: 2.5 mg/dL — AB (ref 0.61–1.24)
Calcium: 8.5 mg/dL — ABNORMAL LOW (ref 8.9–10.3)
GFR calc non Af Amer: 29 mL/min — ABNORMAL LOW (ref >60)
GFR, EST AFRICAN AMERICAN: 33 mL/min — AB (ref >60)
Glucose, Bld: 296 mg/dL — ABNORMAL HIGH (ref 65–99)
POTASSIUM: 4.4 mmol/L (ref 3.5–5.1)
Sodium: 138 mmol/L (ref 135–145)
Total Bilirubin: 1 mg/dL (ref 0.3–1.2)
Total Protein: 8.6 g/dL — ABNORMAL HIGH (ref 6.5–8.1)

## 2018-04-07 LAB — HEPARIN LEVEL (UNFRACTIONATED): HEPARIN UNFRACTIONATED: 0.33 [IU]/mL (ref 0.30–0.70)

## 2018-04-07 LAB — PROTIME-INR
INR: 1.7
PROTHROMBIN TIME: 19.8 s — AB (ref 11.4–15.2)

## 2018-04-07 LAB — TROPONIN I
TROPONIN I: 0.03 ng/mL — AB (ref <0.03)
TROPONIN I: 0.11 ng/mL — AB (ref <0.03)
TROPONIN I: 0.16 ng/mL — AB (ref <0.03)
TROPONIN I: 0.17 ng/mL — AB (ref <0.03)
Troponin I: 0.15 ng/mL (ref <0.03)
Troponin I: 0.17 ng/mL (ref <0.03)

## 2018-04-07 LAB — BETA-HYDROXYBUTYRIC ACID: BETA-HYDROXYBUTYRIC ACID: 1.04 mmol/L — AB (ref 0.05–0.27)

## 2018-04-07 LAB — PROCALCITONIN: PROCALCITONIN: 0.62 ng/mL

## 2018-04-07 LAB — MAGNESIUM: MAGNESIUM: 1.9 mg/dL (ref 1.7–2.4)

## 2018-04-07 LAB — BRAIN NATRIURETIC PEPTIDE: B NATRIURETIC PEPTIDE 5: 116 pg/mL — AB (ref 0.0–100.0)

## 2018-04-07 LAB — PHOSPHORUS: PHOSPHORUS: 3.3 mg/dL (ref 2.5–4.6)

## 2018-04-07 LAB — TRIGLYCERIDES: TRIGLYCERIDES: 325 mg/dL — AB (ref <150)

## 2018-04-07 MED ORDER — POTASSIUM CHLORIDE IN NACL 20-0.9 MEQ/L-% IV SOLN
INTRAVENOUS | Status: DC
  Administered 2018-04-08: via INTRAVENOUS
  Filled 2018-04-07 (×4): qty 1000

## 2018-04-07 MED ORDER — SODIUM CHLORIDE 0.9 % IV SOLN: INTRAVENOUS | Status: AC

## 2018-04-07 MED ORDER — FENTANYL CITRATE (PF) 100 MCG/2ML IJ SOLN: 50.0000 ug | Freq: Once | INTRAMUSCULAR | Status: DC

## 2018-04-07 MED ORDER — AMIODARONE HCL IN DEXTROSE 360-4.14 MG/200ML-% IV SOLN
30.0000 mg/h | INTRAVENOUS | Status: DC
  Administered 2018-04-07 (×2): 30 mg/h via INTRAVENOUS
  Filled 2018-04-07 (×2): qty 200

## 2018-04-07 MED ORDER — MORPHINE SULFATE (PF) 4 MG/ML IV SOLN
4.0000 mg | Freq: Once | INTRAVENOUS | Status: AC
  Administered 2018-04-07: 4 mg via INTRAVENOUS

## 2018-04-07 MED ORDER — HEPARIN (PORCINE) IN NACL 100-0.45 UNIT/ML-% IJ SOLN
2300.0000 [IU]/h | INTRAMUSCULAR | Status: DC
  Administered 2018-04-07 – 2018-04-08 (×3): 1600 [IU]/h via INTRAVENOUS
  Administered 2018-04-09 – 2018-04-13 (×9): 1800 [IU]/h via INTRAVENOUS
  Administered 2018-04-14: 1950 [IU]/h via INTRAVENOUS
  Administered 2018-04-15 – 2018-04-16 (×3): 2150 [IU]/h via INTRAVENOUS
  Administered 2018-04-17 – 2018-04-18 (×4): 2300 [IU]/h via INTRAVENOUS
  Filled 2018-04-07 (×22): qty 250

## 2018-04-07 MED ORDER — AMIODARONE HCL IN DEXTROSE 360-4.14 MG/200ML-% IV SOLN: 60.0000 mg/h | INTRAVENOUS | Status: DC

## 2018-04-07 MED ORDER — ONDANSETRON HCL 4 MG/2ML IJ SOLN
4.0000 mg | Freq: Once | INTRAMUSCULAR | Status: AC
  Administered 2018-04-07: 4 mg via INTRAVENOUS

## 2018-04-07 MED ORDER — CHLORHEXIDINE GLUCONATE 0.12% ORAL RINSE (MEDLINE KIT)
15.0000 mL | Freq: Two times a day (BID) | OROMUCOSAL | Status: DC
  Administered 2018-04-07 – 2018-05-03 (×50): 15 mL via OROMUCOSAL

## 2018-04-07 MED ORDER — DOPAMINE-DEXTROSE 3.2-5 MG/ML-% IV SOLN
0.0000 ug/kg/min | INTRAVENOUS | Status: DC
  Administered 2018-04-07: 20 ug/kg/min via INTRAVENOUS

## 2018-04-07 MED ORDER — SODIUM CHLORIDE 0.9 % IV SOLN
INTRAVENOUS | Status: DC
  Administered 2018-04-07: 100 mL/h via INTRAVENOUS

## 2018-04-07 MED ORDER — PROPOFOL 1000 MG/100ML IV EMUL
5.0000 ug/kg/min | INTRAVENOUS | Status: DC
  Administered 2018-04-07 (×6): 50 ug/kg/min via INTRAVENOUS
  Administered 2018-04-08: 35 ug/kg/min via INTRAVENOUS
  Administered 2018-04-08: 40 ug/kg/min via INTRAVENOUS
  Administered 2018-04-08: 50 ug/kg/min via INTRAVENOUS
  Administered 2018-04-08: 35 ug/kg/min via INTRAVENOUS
  Administered 2018-04-08: 30 ug/kg/min via INTRAVENOUS
  Administered 2018-04-08: 40 ug/kg/min via INTRAVENOUS
  Administered 2018-04-08: 35 ug/kg/min via INTRAVENOUS
  Administered 2018-04-08: 50 ug/kg/min via INTRAVENOUS
  Administered 2018-04-09: 35 ug/kg/min via INTRAVENOUS
  Administered 2018-04-09: 40 ug/kg/min via INTRAVENOUS
  Administered 2018-04-09: 20 ug/kg/min via INTRAVENOUS
  Administered 2018-04-09: 35.856 ug/kg/min via INTRAVENOUS
  Administered 2018-04-09 (×2): 35 ug/kg/min via INTRAVENOUS
  Administered 2018-04-09 (×2): 35.856 ug/kg/min via INTRAVENOUS
  Administered 2018-04-10: 16 ug/kg/min via INTRAVENOUS
  Filled 2018-04-07 (×25): qty 100

## 2018-04-07 MED ORDER — DEXTROSE-NACL 5-0.45 % IV SOLN
INTRAVENOUS | Status: DC
  Administered 2018-04-08: 03:00:00 via INTRAVENOUS

## 2018-04-07 MED ORDER — POTASSIUM CHLORIDE 20 MEQ PO PACK
20.0000 meq | PACK | Freq: Once | ORAL | Status: AC
  Administered 2018-04-07: 20 meq
  Filled 2018-04-07: qty 1

## 2018-04-07 MED ORDER — PROPOFOL 1000 MG/100ML IV EMUL
INTRAVENOUS | Status: AC
  Filled 2018-04-07: qty 100

## 2018-04-07 MED ORDER — FAMOTIDINE IN NACL 20-0.9 MG/50ML-% IV SOLN
20.0000 mg | Freq: Two times a day (BID) | INTRAVENOUS | Status: DC
  Administered 2018-04-07 (×2): 20 mg via INTRAVENOUS
  Filled 2018-04-07 (×2): qty 50

## 2018-04-07 MED ORDER — NOREPINEPHRINE 4 MG/250ML-% IV SOLN
0.0000 ug/min | Freq: Once | INTRAVENOUS | Status: AC
  Administered 2018-04-07: 5 ug/min via INTRAVENOUS

## 2018-04-07 MED ORDER — SODIUM CHLORIDE 0.9 % IV SOLN
INTRAVENOUS | Status: DC
  Administered 2018-04-07: 20.4 [IU]/h via INTRAVENOUS
  Administered 2018-04-07: 4.1 [IU]/h via INTRAVENOUS
  Administered 2018-04-08: 27.7 [IU]/h via INTRAVENOUS
  Administered 2018-04-08: 24.5 [IU]/h via INTRAVENOUS
  Administered 2018-04-08: 22.7 [IU]/h via INTRAVENOUS
  Filled 2018-04-07 (×7): qty 1

## 2018-04-07 MED ORDER — DOPAMINE HCL 40 MG/ML IV SOLN
15.0000 ug/kg/min | INTRAVENOUS | Status: DC
  Administered 2018-04-07: 15 ug/kg/min via INTRAVENOUS

## 2018-04-07 MED ORDER — ORAL CARE MOUTH RINSE
15.0000 mL | OROMUCOSAL | Status: DC
  Administered 2018-04-07 – 2018-05-03 (×243): 15 mL via OROMUCOSAL

## 2018-04-07 MED ORDER — ASPIRIN 300 MG RE SUPP: 300.0000 mg | RECTAL | Status: AC

## 2018-04-07 MED ORDER — ONDANSETRON HCL 4 MG/2ML IJ SOLN: 4.0000 mg | Freq: Four times a day (QID) | INTRAMUSCULAR | Status: DC | PRN

## 2018-04-07 MED ORDER — AMIODARONE LOAD VIA INFUSION
150.0000 mg | Freq: Once | INTRAVENOUS | Status: AC
  Administered 2018-04-07: 150 mg via INTRAVENOUS

## 2018-04-07 MED ORDER — AMIODARONE HCL IN DEXTROSE 360-4.14 MG/200ML-% IV SOLN
INTRAVENOUS | Status: AC
  Filled 2018-04-07: qty 200

## 2018-04-07 MED ORDER — PIPERACILLIN-TAZOBACTAM 3.375 G IVPB
3.3750 g | Freq: Three times a day (TID) | INTRAVENOUS | Status: DC
  Administered 2018-04-07 – 2018-04-10 (×9): 3.375 g via INTRAVENOUS
  Filled 2018-04-07 (×9): qty 50

## 2018-04-07 MED ORDER — MAGNESIUM SULFATE 2 GM/50ML IV SOLN: 2.0000 g | Freq: Once | INTRAVENOUS | Status: DC

## 2018-04-07 MED ORDER — SODIUM CHLORIDE 0.9 % IV SOLN: 250.0000 mL | INTRAVENOUS | Status: DC | PRN

## 2018-04-07 MED ORDER — POTASSIUM CHLORIDE 20 MEQ PO PACK
40.0000 meq | PACK | ORAL | Status: AC
  Administered 2018-04-07 – 2018-04-08 (×2): 40 meq via ORAL
  Filled 2018-04-07 (×2): qty 2

## 2018-04-07 MED ORDER — FENTANYL 2500MCG IN NS 250ML (10MCG/ML) PREMIX INFUSION
0.0000 ug/h | INTRAVENOUS | Status: DC
  Administered 2018-04-07: 100 ug/h via INTRAVENOUS
  Administered 2018-04-08: 200 ug/h via INTRAVENOUS
  Administered 2018-04-08: 150 ug/h via INTRAVENOUS
  Administered 2018-04-09 (×2): 250 ug/h via INTRAVENOUS
  Administered 2018-04-10: 220 ug/h via INTRAVENOUS
  Administered 2018-04-10 – 2018-04-11 (×2): 200 ug/h via INTRAVENOUS
  Administered 2018-04-11: 175 ug/h via INTRAVENOUS
  Administered 2018-04-12 – 2018-04-15 (×8): 225 ug/h via INTRAVENOUS
  Administered 2018-04-15 – 2018-04-16 (×3): 250 ug/h via INTRAVENOUS
  Administered 2018-04-16: 25 ug/h via INTRAVENOUS
  Administered 2018-04-17: 225 ug/h via INTRAVENOUS
  Administered 2018-04-18 – 2018-04-21 (×8): 250 ug/h via INTRAVENOUS
  Administered 2018-04-21 (×2): 275 ug/h via INTRAVENOUS
  Administered 2018-04-22: 300 ug/h via INTRAVENOUS
  Administered 2018-04-22: 250 ug/h via INTRAVENOUS
  Administered 2018-04-22: 300 ug/h via INTRAVENOUS
  Administered 2018-04-23: 350 ug/h via INTRAVENOUS
  Administered 2018-04-24: 250 ug/h via INTRAVENOUS
  Administered 2018-04-24: 350 ug/h via INTRAVENOUS
  Administered 2018-04-25: 100 ug/h via INTRAVENOUS
  Administered 2018-04-25: 250 ug/h via INTRAVENOUS
  Filled 2018-04-07 (×45): qty 250

## 2018-04-07 MED ORDER — ATROPINE SULFATE 1 MG/ML IJ SOLN
1.0000 mg | Freq: Once | INTRAMUSCULAR | Status: AC
  Administered 2018-04-07: 1 mg via INTRAVENOUS

## 2018-04-07 MED ORDER — AMIODARONE HCL IN DEXTROSE 360-4.14 MG/200ML-% IV SOLN: 30.0000 mg/h | INTRAVENOUS | Status: DC

## 2018-04-07 MED ORDER — SODIUM CHLORIDE 0.9 % IV SOLN: INTRAVENOUS | Status: DC | PRN

## 2018-04-07 MED ORDER — INSULIN ASPART 100 UNIT/ML ~~LOC~~ SOLN: 0.0000 [IU] | SUBCUTANEOUS | Status: DC

## 2018-04-07 MED ORDER — SODIUM CHLORIDE 0.9 % IV SOLN
1.0000 g | Freq: Once | INTRAVENOUS | Status: AC
  Administered 2018-04-07: 1 g via INTRAVENOUS
  Filled 2018-04-07: qty 10

## 2018-04-07 MED ORDER — EPINEPHRINE PF 1 MG/ML IJ SOLN
1.0000 mg | Freq: Once | INTRAMUSCULAR | Status: AC
  Administered 2018-04-07: 1 mg via INTRAVENOUS

## 2018-04-07 MED ORDER — MORPHINE SULFATE (PF) 4 MG/ML IV SOLN
INTRAVENOUS | Status: AC
  Administered 2018-04-07: 4 mg via INTRAVENOUS
  Filled 2018-04-07: qty 1

## 2018-04-07 MED ORDER — DOPAMINE-DEXTROSE 1.6-5 MG/ML-% IV SOLN: 2.0000 ug/kg/min | Freq: Once | INTRAVENOUS | Status: DC

## 2018-04-07 MED ORDER — FENTANYL BOLUS VIA INFUSION
50.0000 ug | INTRAVENOUS | Status: DC | PRN
  Administered 2018-04-09 – 2018-04-11 (×2): 50 ug via INTRAVENOUS
  Administered 2018-04-16: 25 ug via INTRAVENOUS
  Administered 2018-04-19 – 2018-04-25 (×13): 50 ug via INTRAVENOUS
  Filled 2018-04-07: qty 50

## 2018-04-07 MED ORDER — ASPIRIN 81 MG PO CHEW: 324.0000 mg | CHEWABLE_TABLET | ORAL | Status: AC

## 2018-04-07 MED ORDER — ONDANSETRON HCL 4 MG/2ML IJ SOLN
INTRAMUSCULAR | Status: AC
  Administered 2018-04-07: 4 mg via INTRAVENOUS
  Filled 2018-04-07: qty 2

## 2018-04-07 MED ORDER — PROPOFOL 1000 MG/100ML IV EMUL
5.0000 ug/kg/min | Freq: Once | INTRAVENOUS | Status: AC
  Administered 2018-04-07: 20 ug/kg/min via INTRAVENOUS

## 2018-04-07 MED ORDER — NOREPINEPHRINE 4 MG/250ML-% IV SOLN
0.0000 ug/min | INTRAVENOUS | Status: DC
  Administered 2018-04-07: 40 ug/min via INTRAVENOUS
  Administered 2018-04-07: 23 ug/min via INTRAVENOUS
  Administered 2018-04-07: 22 ug/min via INTRAVENOUS
  Administered 2018-04-07 (×2): 25 ug/min via INTRAVENOUS
  Administered 2018-04-08: 23 ug/min via INTRAVENOUS
  Administered 2018-04-08: 22 ug/min via INTRAVENOUS
  Administered 2018-04-08: 12 ug/min via INTRAVENOUS
  Administered 2018-04-08: 10 ug/min via INTRAVENOUS
  Administered 2018-04-09 (×2): 13 ug/min via INTRAVENOUS
  Administered 2018-04-09: 10 ug/min via INTRAVENOUS
  Administered 2018-04-10: 2.5 ug/min via INTRAVENOUS
  Administered 2018-04-10: 5 ug/min via INTRAVENOUS
  Filled 2018-04-07 (×14): qty 250

## 2018-04-07 MED ORDER — HEPARIN BOLUS VIA INFUSION
4000.0000 [IU] | Freq: Once | INTRAVENOUS | Status: AC
  Administered 2018-04-07: 4000 [IU] via INTRAVENOUS
  Filled 2018-04-07: qty 4000

## 2018-04-07 MED ORDER — ATROPINE SULFATE 1 MG/ML IJ SOLN
1.0000 mg | Freq: Once | INTRAMUSCULAR | Status: AC
Start: 2018-04-07 — End: 2018-04-07
  Administered 2018-04-07: 1 mg via INTRAVENOUS

## 2018-04-07 MED FILL — Medication: Qty: 1 | Status: AC

## 2018-04-07 NOTE — ED Notes (Signed)
Patient's wife initially reported patient fell twice in the past week. Patient's wife now reports that patient had a near syncopal episode in shower on Saturday. Patient's wife reports a second fall on either Tuesday or Wednesday. The fall was not witnessed, patient's wife is unsure if this was a near syncopal episode as well.

## 2018-04-07 NOTE — Progress Notes (Signed)
   04/07/18 0520  Clinical Encounter Type  Visited With Patient and family together  Visit Type Code;ED  Referral From Nurse  Consult/Referral To Chaplain  Spiritual Encounters  Spiritual Needs Prayer;Emotional;Grief support   CH received a Code Blue PG for PT having a MRI. PT was transported back to ED01 where Care team worked with PT. CH was notified that PT's wife was in the family RM. CH reported to family RM and provided ministry of presences and emotional support. When PT was stable, CH escorted PT to ED01 to be with PT. As PT became more stable, PT's wife thank [facility] for his presences and CH departed RM.

## 2018-04-07 NOTE — ED Notes (Signed)
Patient reports 2 falls in the last week.

## 2018-04-07 NOTE — Progress Notes (Signed)
Pharmacy Antibiotic Note  Alexander Alexander is a 47 y.o. male admitted on 04/07/2018 s/p cardiac arrest started on antibiotics for possible aspiration pneumonia.  Pharmacy has been consulted for Zosyn dosing.  Plan: Zosyn EI 3.375g IV Q8hr. Procalcitonin elevated at 0.62. Will continue to trend procalcitonins.   Height:  (177.8 cm)(based on previous notes) Weight: (!) 370 lb (167.8 kg) IBW/kg (Calculated) : 73  Temp (24hrs), Avg:98.4 F (36.9 C), Min:97.5 F (36.4 C), Max:100.6 F (38.1 C)  Recent Labs  Lab 04/07/18 0546 04/07/18 1056 04/07/18 1219 04/07/18 1233 04/07/18 1546 04/07/18 1911  WBC 9.4  --   --   --   --   --   CREATININE 2.50* 2.77* 2.71*  --   --  2.44*  LATICACIDVEN  --   --   --  2.4* 2.9*  --     Estimated Creatinine Clearance: 58.1 mL/min (A) (by C-G formula based on SCr of 2.44 mg/dL (H)).    Allergies  Allergen Reactions  . Lexapro [Escitalopram Oxalate] Swelling    Antimicrobials this admission: Zosyn 5/31 >>   Dose adjustments this admission: N/A  Microbiology results: 5/31 MRSA PCR: pending   Thank you for allowing pharmacy to be a part of this patient's care.  Markie Heffernan L 04/07/2018 10:54 PM

## 2018-04-07 NOTE — ED Notes (Signed)
Patient transported to MRI 

## 2018-04-07 NOTE — Progress Notes (Addendum)
Pharmacy Electrolyte Monitoring Consult:  Pharmacy consulted to assist in monitoring and replacing electrolytes in this 47 y.o. male admitted on 04/07/2018 s/p cardiac arrest and requiring mechanical ventilation. Patient on insulin infusion for DKA.    Patient currently ordered NS@ 150mL/hr.   Labs:  Sodium (mmol/L)  Date Value  04/07/2018 139  03/29/2018 142   Potassium (mmol/L)  Date Value  04/07/2018 3.5   Magnesium (mg/dL)  Date Value  40/98/1191 1.9   Phosphorus (mg/dL)  Date Value  47/82/9562 3.3   Calcium (mg/dL)  Date Value  13/06/6577 8.3 (L)   Albumin (g/dL)  Date Value  46/96/2952 4.1  12/09/2017 4.2    Assessment/Plan: Changed IVF to NS with potassium 75mEq/L /hr. Will order additional potassium VT x 2.   Will continue Q4hr BMPs while being treated for DKA. Continue to follow DKA protocol.   Pharmacy will continue to monitor and adjust per consult.   Simpson,Michael L 04/07/2018 10:57 PM

## 2018-04-07 NOTE — Consult Note (Signed)
[facility] Cardiology Consultation Note  Patient ID: Alexander Alexander, MRN: [id number], DOB/AGE: [birth year] 47 y.o. Admit date: 04/07/2018   Date of Consult: 04/07/2018 Primary Physician: Erasmo Downer, MD Primary Cardiologist: Unknown  Chief Complaint:  Chief Complaint  Patient presents with  . Back Pain   Reason for Consult: Cardiac arrest  HPI: 47 y.o. male with known aortic valve stenosis status post aortic valve replacement with a mechanical valve on appropriate medication management including warfarin with chronic diastolic dysfunction congestive heart failure sleep apnea hypoxemia hyperlipidemia hyper tension diabetes with complication chronic kidney disease has had some significant back pain.  The patient was not reporting any shortness of breath or chest discomfort and went to the MRI.  At that time the patient had unresponsiveness and ended up having pulseless electrical activity with apparent unresponsiveness.  The patient received appropriate ACLS protocol and did have a return of pulse with intubation and ventilation completely recovered.  He was placed with sedation and has continued to be hemodynamically since stable since that event.  Currently troponin level has been 0.03 and 0.11 more consistent with demand ischemia rather than acute coronary syndrome and BNP was 116 unchanged from his last outpatient visit consistent with no evidence of congestive heart failure at this time.  It would suggest that the patient may have had a respiratory event with PEA.  Currently he is hemodynamically stable and will need further treatment  Past Medical History:  Diagnosis Date  . Allergy   . Anxiety   . Arrhythmia   . Arthritis   . Atrial fibrillation (HCC)   . CHF (congestive heart failure) (HCC)   . CKD (chronic kidney disease)   . Diabetes mellitus without complication (HCC)   . Diabetes mellitus, type II (HCC)   . Heart failure (HCC)   . Hyperlipidemia   . Hypertension    . Pancreatitis   . Sleep apnea       Surgical History:  Past Surgical History:  Procedure Laterality Date  . ABLATION  08/01/2017  . MECHANICAL AORTIC VALVE REPLACEMENT       Home Meds: Prior to Admission medications   Medication Sig Start Date End Date Taking? Authorizing Provider  allopurinol (ZYLOPRIM) 100 MG tablet TAKE 0.5 TABLETS (50 MG TOTAL) BY MOUTH DAILY. 03/10/18  Yes Bacigalupo, Marzella Schlein, MD  ALPRAZolam Prudy Feeler) 0.5 MG tablet Take 1 tablet (0.5 mg total) by mouth 2 (two) times daily. 01/16/18  Yes Bacigalupo, Marzella Schlein, MD  colchicine 0.6 MG tablet TAKE 1 TABLET BY MOUTH EVERY DAY 04/04/18  Yes Bacigalupo, Marzella Schlein, MD  fluvoxaMINE (LUVOX) 25 MG tablet Take 1 tablet (25 mg total) by mouth at bedtime. 04/05/18  Yes Jomarie Longs, MD  gabapentin (NEURONTIN) 600 MG tablet Take 1 tablet (600 mg total) by mouth 2 (two) times daily. 01/19/18  Yes Bacigalupo, Marzella Schlein, MD  glimepiride (AMARYL) 2 MG tablet Take 1 tablet (2 mg total) by mouth daily with breakfast. 12/09/17  Yes Bacigalupo, Marzella Schlein, MD  insulin detemir (LEVEMIR) 100 UNIT/ML injection Inject 0.65 mLs (65 Units total) into the skin 2 (two) times daily. 01/16/18  Yes Bacigalupo, Marzella Schlein, MD  losartan (COZAAR) 100 MG tablet Take 1 tablet (100 mg total) by mouth daily. 01/16/18  Yes Bacigalupo, Marzella Schlein, MD  metolazone (ZAROXOLYN) 2.5 MG tablet Take 1 tablet (2.5 mg total) by mouth 2 (two) times daily. 03/29/18  Yes Bacigalupo, Marzella Schlein, MD  metoprolol (TOPROL-XL) 200 MG 24 hr tablet Take 200  mg by mouth daily.  12/22/17  Yes [provider]  NOVOLOG 100 UNIT/ML injection Inject 35 Units into the skin 3 (three) times daily with meals. Patient taking differently: Inject 40 Units into the skin 3 (three) times daily with meals.  12/26/17  Yes Bacigalupo, Marzella Schlein, MD  torsemide (DEMADEX) 20 MG tablet Take 2 tablets (40 mg total) by mouth 2 (two) times daily. Patient taking differently: Take 20 mg by mouth 2 (two) times daily.   11/25/17 04/07/18 Yes Clarisa Kindred A, FNP  warfarin (COUMADIN) 5 MG tablet Take 5 mg by mouth daily at 6 PM.  04/05/18  Yes [provider]  acetaminophen (TYLENOL) 325 MG tablet Take 650 mg by mouth every 6 (six) hours as needed.    [provider]    Inpatient Medications:  . aspirin  324 mg Oral NOW   Or  . aspirin  300 mg Rectal NOW  . fentaNYL (SUBLIMAZE) injection  50 mcg Intravenous Once   . sodium chloride    . sodium chloride 100 mL/hr (04/07/18 1217)  . sodium chloride    . amiodarone 30 mg/hr (04/07/18 1246)  . amiodarone    . calcium gluconate 1 g (04/07/18 1239)  . dextrose 5 % and 0.45% NaCl    . DOPamine Stopped (04/07/18 0930)  . famotidine (PEPCID) IV    . fentaNYL infusion INTRAVENOUS 100 mcg/hr (04/07/18 1118)  . insulin (NOVOLIN-R) infusion 4.1 Units/hr (04/07/18 1238)  . magnesium sulfate 1 - 4 g bolus IVPB Stopped (04/07/18 0624)  . norepinephrine (LEVOPHED) Adult infusion 40 mcg/min (04/07/18 1154)  . piperacillin-tazobactam (ZOSYN)  IV    . propofol Stopped (04/07/18 1119)  . propofol (DIPRIVAN) infusion 50 mcg/kg/min (04/07/18 1045)    Allergies:  Allergies  Allergen Reactions  . Lexapro [Escitalopram Oxalate] Swelling    Social History   Socioeconomic History  . Marital status: Married    Spouse name: teresa  . Number of children: 0  . Years of education: Not on file  . Highest education level: Master's degree (e.g., MA, MS, MEng, MEd, MSW, MBA)  Occupational History  . Occupation: retired    Comment: Building control surveyor  . Occupation: permanent disability  Social Needs  . Financial resource strain: Not hard at all  . Food insecurity:    Worry: Never true    Inability: Never true  . Transportation needs:    Medical: No    Non-medical: No  Tobacco Use  . Smoking status: Former Smoker    Packs/day: 0.50    Years: 20.00    Pack years: 10.00    Types: Cigarettes    Last attempt to quit: 04/25/2014    Years since  quitting: 3.9  . Smokeless tobacco: Never Used  . Tobacco comment: quit around june 2015  Substance and Sexual Activity  . Alcohol use: No    Frequency: Never  . Drug use: Not Currently    Types: Cocaine    Comment: January 28, 2017 quit Cocaine  . Sexual activity: Never  Lifestyle  . Physical activity:    Days per week: 0 days    Minutes per session: 0 min  . Stress: To some extent  Relationships  . Social connections:    Talks on phone: Three times a week    Gets together: Three times a week    Attends religious service: Never    Active member of club or organization: No    Attends meetings of clubs or organizations: Never  Relationship status: Married  . Intimate partner violence:    Fear of current or ex partner: No    Emotionally abused: No    Physically abused: No    Forced sexual activity: No  Other Topics Concern  . Not on file  Social History Narrative  . Not on file     Family History  Problem Relation Age of Onset  . CAD Mother   . Hypertension Mother   . Hypertension Father   . Asthma Father   . Dementia Father   . Diabetes Brother   . Depression Brother   . Hypertension Brother   . Hypertension Brother   . Depression Brother   . Pancreatic cancer Paternal Aunt        several aunts and uncles with pancreatic cancer on paternal side  . Breast cancer Maternal Aunt   . Colon cancer Neg Hx   . Prostate cancer Neg Hx      Review of Systems Not assessed due to intubation Labs: Recent Labs    04/07/18 0546 04/07/18 1056  TROPONINI 0.03* 0.11*   Lab Results  Component Value Date   WBC 9.4 04/07/2018   HGB 18.2 (H) 04/07/2018   HCT 54.7 (H) 04/07/2018   MCV 86.4 04/07/2018   PLT 231 04/07/2018    Recent Labs  Lab 04/07/18 0546 04/07/18 1056  NA 138 134*  K 4.4 4.5  CL 91* 90*  CO2 29 27  BUN 63* 62*  CREATININE 2.50* 2.77*  CALCIUM 8.5* 7.9*  PROT 8.6*  --   BILITOT 1.0  --   ALKPHOS 84  --   ALT 42  --   AST 67*  --   GLUCOSE  296* 463*   Lab Results  Component Value Date   CHOL 175 12/09/2017   HDL 31 (L) 12/09/2017   LDLCALC 77 12/09/2017   TRIG 325 (H) 04/07/2018   No results found for: DDIMER  Radiology/Studies:  Dg Chest 1 View  Result Date: 04/07/2018 CLINICAL DATA:  47 y/o  M; intubated. EXAM: CHEST  1 VIEW COMPARISON:  12/06/2017 chest radiograph FINDINGS: Stable cardiomegaly given projection and technique. Transcutaneous pacing pads. Post median sternotomy with wires in alignment. Low lung volumes. Pulmonary vascular congestion. No focal consolidation. Endotracheal tube tip 2.4 cm above the carina. Enteric tube tip appears to project over the lower esophagus, advancement recommended. IMPRESSION: 1. Endotracheal tube tip 2.4 cm above the carina. 2. Cardiomegaly and pulmonary vascular congestion. 3. Enteric tube tip appears to project over the lower esophagus, advancement recommended. Electronically Signed   By: Mitzi Hansen M.D.   On: 04/07/2018 06:22   Dg Chest Portable 1 View  Result Date: 04/07/2018 CLINICAL DATA:  Central catheter placement.  Hypoxia. EXAM: PORTABLE CHEST 1 VIEW COMPARISON:  Apr 07, 2018 study obtained earlier in the day FINDINGS: Right jugular catheter tip is in the superior vena cava near the cavoatrial junction. Endotracheal tube tip is 4.0 cm above the carina. Nasogastric tube tip and side port are below the diaphragm. No pneumothorax. There is bibasilar atelectasis. Lungs elsewhere are clear. 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 No adenopathy. No bone lesions. Patient is status post median sternotomy with aortic valve replacement. IMPRESSION: Tube and catheter positions as described without pneumothorax. There is pulmonary vascular congestion. Bibasilar atelectasis present. No consolidation evident. Electronically Signed   By: Bretta Bang III M.D.   On: 04/07/2018 08:51   Dg Abd Portable 1v  Result Date: 04/07/2018 CLINICAL DATA:  NG placement  EXAM: PORTABLE ABDOMEN - 1 VIEW COMPARISON:  None. FINDINGS: Limited exam due to large patient size and difficulty with positioning. NG tip in the proximal stomach. NG side hole in the distal esophagus. No dilated bowel loops. IMPRESSION: NG tube in the proximal stomach with the side hole in the distal esophagus. Recommend advancing at least 5 cm. Electronically Signed   By: Marlan Palau M.D.   On: 04/07/2018 10:48    EKG: Atrial fibrillation with rapid ventricular rate  Weights: Filed Weights   04/07/18 0222  Weight: (!) 370 lb (167.8 kg)     Physical Exam: Blood pressure (!) 153/130, pulse 76, temperature (!) 97.5 F (36.4 C), temperature source Oral, resp. rate (!) 21, weight (!) 370 lb (167.8 kg), SpO2 93 %. Body mass index is 63.95 kg/m. General: Well developed, well nourished, in no acute distress. Head eyes ears nose throat: Normocephalic, atraumatic, sclera non-icteric, no xanthomas, nares are without discharge. No apparent thyromegaly and/or mass  Lungs: Normal respiratory effort.  Few wheezes, no rales, no rhonchi.  Heart: RRR with normal S1 mechanical S2.  2+ right upper sternal border murmur gallop, no rub, PMI is normal size and placement, carotid upstroke normal without bruit, jugular venous pressure is normal Abdomen: Soft, non-tender, non-distended with normoactive bowel sounds. No hepatomegaly. No rebound/guarding. No obvious abdominal masses. Abdominal aorta is normal size without bruit Extremities: No edema. no cyanosis, no clubbing, no ulcers  Peripheral : 2+ bilateral upper extremity pulses, 2+ bilateral femoral pulses, 2+ bilateral dorsal pedal pulse Neuro: Is not alert and oriented. No facial asymmetry. No focal deficit. Moves all extremities spontaneously. Musculoskeletal: Normal muscle tone without kyphosis Psych: Does not responds to questions appropriately with a normal affect.    Assessment: 47 year old male with aortic valve stenosis status post aortic  valve replacement chronic diastolic dysfunction congestive heart failure sleep apnea essential hypertension mixed hyperlipidemia diabetes with acute pulseless electrical activity and unresponsiveness now hemodynamically stable with intubation doubt evidence of current acute coronary syndrome or heart failure  Plan: 1.  To new trending troponin to assess for possible myocardial infarction 2.  Continue intubation and oxygenation for pulseless electrical activity and unresponsiveness with hypoxia 3.  Echocardiogram for LV systolic dysfunction valvular heart disease changes consistent with issues related to above 4.  Continue amiodarone at this point for presumed ventricular tachycardia until further evaluation 5.  Anticoagulation for atrial fibrillation and mechanical valve 6.  Further treatment options after above  Signed, Lamar Blinks M.D. [facility] Cardiology 04/07/2018, 12:47 PM

## 2018-04-07 NOTE — ED Notes (Signed)
Code called in MRI. This RN, Baird Lyons, MD Valerie Salts RN, Butch, [place] NT, Myra NT, Christy RT, and MRI tech present for code. Patient given CPR 2 rounds CPR in front of this RN and 2 Epi's. Patient pulse returned. Patient intubated in MRI. Patient brought back to patient room. Family updated by MD Bartholomew Boards RN.

## 2018-04-07 NOTE — ED Notes (Signed)
ED Provider at bedside. 

## 2018-04-07 NOTE — ED Notes (Signed)
Attempted to call report to ICU.

## 2018-04-07 NOTE — Progress Notes (Signed)
Initial Nutrition Assessment  DOCUMENTATION CODES:   Obesity unspecified  INTERVENTION:  If patient remains intubated, is hemodynamically stable and able to transition off of insulin gtt, recommend Vital High protein at 365mL/hr, Pro-stat 60mL 5 times daily  With propofol at 50.643mL/hr (1328 calories), provides 2648 calories (126% estimated needs),191 grams of protein, 100mL free water  NUTRITION DIAGNOSIS:   Inadequate oral intake related to inability to eat as evidenced by NPO status.  GOAL:   Provide needs based on ASPEN/SCCM guidelines  MONITOR:   Vent status, Labs, Weight trends, I & O's  REASON FOR ASSESSMENT:   Ventilator    ASSESSMENT:   Alexander Alexander has a PMH of sciatica, CHF, HTN, T2DM, OSA, CKD III. He presented with low back pain was in radiology when he coded, CPR administered per ACLS protocol, ROSC obtained, he was intubated and transferred to ICU.  Patient also is in DKA per CCM  Patient is currently intubated on ventilator support MV: 9.9 L/min Temp (24hrs), Avg:97.5 F (36.4 C), Min:97.5 F (36.4 C), Max:97.5 F (36.4 C)  Spoke with patient's wife at bedside. She reports no issues with PO intake or weight loss PTA. Patient's UBW hovers between 325-330 pounds but patient has gained 35-40 pounds due to gut edema. Utilized UBW of 330 pounds for estimated needs calculations. Discussed in rounds and with RN, propofol rate will remain at 3650mL/hr for now.  Propofol: 50.3 ml/hr --> 1328 calories  Map: 61, 101, 102, 48, 33  Labs reviewed:  CBGs 516, 467 Na 133 AST 67 BNP 116 BUN/Creatinine 67/2.71  Medications reviewed and include:  Famotidine Amiodarone gtt D5 1/2 NS at 12700mL/hr --> 408 calories Fentanyl gtt Levo gtt Insulin gtt   NUTRITION - FOCUSED PHYSICAL EXAM:    Most Recent Value  Orbital Region  No depletion  Upper Arm Region  No depletion  Thoracic and Lumbar Region  No depletion  Buccal Region  No depletion  Temple Region  No depletion   Clavicle Bone Region  No depletion  Clavicle and Acromion Bone Region  No depletion  Scapular Bone Region  No depletion  Dorsal Hand  No depletion  Patellar Region  No depletion  Anterior Thigh Region  No depletion  Posterior Calf Region  No depletion  Edema (RD Assessment)  Mild       Diet Order:   Diet Order           Diet NPO time specified  Diet effective now          EDUCATION NEEDS:   Not appropriate for education at this time  Skin:  Skin Assessment: Reviewed RN Assessment  Last BM:  PTA  Height:   Ht Readings from Last 1 Encounters:  04/07/18 5\' 10"  (1.778 m)    Weight:   Wt Readings from Last 1 Encounters:  04/07/18 (!) 370 lb (167.8 kg)  Utilized weight of 330 pounds, per wife patient's UBW is between 325-330 pounds, currently exhibits significant gut edema  Ideal Body Weight:  75.45 kg  BMI:  Body mass index is 53.09 kg/m.  Utilized BMI of 46 based on Wife's statement of UBW 330 pounds  Estimated Nutritional Needs:   Kcal:  1650 - 2100 calories (ABW x11-14)  Protein:  >/= 188 grams (2.5g/kg IBW)  Fluid:  per MD  Dionne AnoWilliam M. Darry Kelnhofer, MS, RD LDN Inpatient Clinical Dietitian Pager [phone]

## 2018-04-07 NOTE — Progress Notes (Signed)
Inpatient Diabetes Program Recommendations  AACE/ADA: New Consensus Statement on Inpatient Glycemic Control (2015)  Target Ranges:  Prepandial:   less than 140 mg/dL      Peak postprandial:   less than 180 mg/dL (1-2 hours)      Critically ill patients:  140 - 180 mg/dL   Results for Alexander Alexander, Alexander Alexander (MRN [id number]) as of 04/07/2018 09:38  Ref. Range 04/07/2018 09:32  Glucose-Capillary Latest Ref Range: 65 - 99 mg/dL 409 (H)   Admit: Back Pain/ Cardiac Arrest during MRI  History: DM, CKD, CHF  Home DM Meds: Levemir 65 units BID        Novolog 40 units TID        Amaryl 2 mg daily  Current Orders: Novolog Moderate Correction Scale/ SSI (0-15 units) Q4 hours     Patient saw his PCP Dr. Shirlee Latch on 03/29/2018.  At that visit, A1c was rechecked and found to be 11.7% on lab draw.    Patient was referred to Endocrinology since he is requiring high doses of insulin and is not being successful with glucose control.  Not sure which practice patient was referred to.    MD- Note patient suffered Cardiac Arrest this AM during MRI.  CBG 492 mg/dl at 8:11BJ today.  Currently Intubated.  Please discontinue Novolog SSI Q4H and Start Phase 2 (IV Insulin) of the ICU Glycemic Control Protocol    --Will follow patient during hospitalization--  Ambrose Finland RN, MSN, CDE Diabetes Coordinator Inpatient Glycemic Control Team Team Pager: [phone] (8a-5p)

## 2018-04-07 NOTE — ED Notes (Signed)
Transported pt to MRI.  Pt was awake and talking during the transport.  Pt walked into the MRI room, laid on the table, and when the MRI Tech began explaining to him the procedure, pt became unconscious.  Pt transferred to stretcher by myself and MRI Tech.  Charge nurse notified, Unable to locate a pulse and CPR started.  ED staff arrived within the minute of the call.

## 2018-04-07 NOTE — Progress Notes (Signed)
ANTICOAGULATION CONSULT NOTE - Initial Consult  Pharmacy Consult for heparin drip management/warfarin management  Indication: atrial fibrillation and aortic valve replacement   Allergies  Allergen Reactions  . Lexapro [Escitalopram Oxalate] Swelling    Patient Measurements: Height: 5' 10"  (177.8 cm)(based on previous notes) Weight: (!) 370 lb (167.8 kg) IBW/kg (Calculated) : 73 Heparin Dosing Weight: 111kg  Vital Signs: Temp: 100.6 F (38.1 C) (05/31 2000) Temp Source: Axillary (05/31 2000) BP: 119/78 (05/31 2100) Pulse Rate: 93 (05/31 2100)  Labs: Recent Labs    04/07/18 0546 04/07/18 1056 04/07/18 1219 04/07/18 1233 04/07/18 1546 04/07/18 1911  HGB 18.2*  --   --   --   --   --   HCT 54.7*  --   --   --   --   --   PLT 231  --   --   --   --   --   LABPROT 19.8*  --   --   --   --   --   INR 1.70  --   --   --   --   --   CREATININE 2.50* 2.77* 2.71*  --   --  2.44*  TROPONINI 0.03* 0.11*  --  0.15* 0.16* 0.17*    Estimated Creatinine Clearance: 58.1 mL/min (A) (by C-G formula based on SCr of 2.44 mg/dL (H)).   Medical History: Past Medical History:  Diagnosis Date  . Allergy   . Anxiety   . Arrhythmia   . Arthritis   . Atrial fibrillation ([place])   . CHF (congestive heart failure) ([place])   . CKD (chronic kidney disease)   . Diabetes mellitus without complication ([place])   . Diabetes mellitus, type II ([place])   . Heart failure ([place])   . Hyperlipidemia   . Hypertension   . Pancreatitis   . Sleep apnea     Medications:  Scheduled:  . aspirin  324 mg Oral NOW   Or  . aspirin  300 mg Rectal NOW  . chlorhexidine gluconate (MEDLINE KIT)  15 mL Mouth Rinse BID  . fentaNYL (SUBLIMAZE) injection  50 mcg Intravenous Once  . mouth rinse  15 mL Mouth Rinse 10 times per day   Infusions:  . sodium chloride    . 0.9 % NaCl with KCl 20 mEq / L    . amiodarone 30 mg/hr (04/07/18 1800)  . dextrose 5 % and 0.45% NaCl    . DOPamine Stopped (04/07/18 0930)  .  famotidine (PEPCID) IV Stopped (04/07/18 2154)  . fentaNYL infusion INTRAVENOUS 100 mcg/hr (04/07/18 1800)  . heparin 1,600 Units/hr (04/07/18 1800)  . insulin (NOVOLIN-R) infusion 20.4 Units/hr (04/07/18 2021)  . magnesium sulfate 1 - 4 g bolus IVPB Stopped (04/07/18 0624)  . norepinephrine (LEVOPHED) Adult infusion 23 mcg/min (04/07/18 2033)  . piperacillin-tazobactam (ZOSYN)  IV 3.375 g (04/07/18 2120)  . propofol (DIPRIVAN) infusion 50 mcg/kg/min (04/07/18 2143)    Assessment: Pharmacy consulted for warfarin and heparin drip management for 47 yo male admitted to the ICU s/p cardiac arrest. Patient has history significant for aortic valve replacement, atrial fibrillation and CHF. Patient currently requiring amiodarone infusion and blood pressure support from norepinephrine. Head CT is negative for infarction or hemorrhage.   Patient takes warfarin 31m daily as an outpatient for goal INR 2.5-3.5.   Goal of Therapy:  INR 2-3 Heparin level 0.3-0.7 units/ml Monitor platelets by anticoagulation protocol: Yes   Plan:  Heparin: patient received heparin bolus of 4000 units  and infusion initiated at 1600 units/hr. Will obtain initial heparin level at 2300.   Warfarin: plan is to hold warfarin until patient is more stable. Will tentatively plan to resume warfarin on 6/2. INR was subtherapeutic on admission at 1.7. Will obtain INR with am labs.   Pharmacy will continue to monitor and adjust per consult.   Shanyla Marconi L 04/07/2018,10:33 PM

## 2018-04-07 NOTE — Progress Notes (Signed)
Patient admitted this morning.  Patient intubated after cardiac arrest.  Management as per intensivist.  Follow-up on cardiology consult.

## 2018-04-07 NOTE — ED Notes (Signed)
X-ray at bedside

## 2018-04-07 NOTE — ED Triage Notes (Signed)
Patient c/o lower back pain radiating down left leg X 2 months. Patient reports hx of sciatic radiating down right (opposite leg). Patient reports this pain feels like previous sciatica pain.

## 2018-04-07 NOTE — ED Notes (Signed)
1 unsuccessful attempt at IV insertion by this RN. Butch RN to bedside to attempt PIV insertion

## 2018-04-07 NOTE — Progress Notes (Signed)
ANTICOAGULATION CONSULT NOTE - Initial Consult  Pharmacy Consult for heparin drip management/warfarin management  Indication: atrial fibrillation and aortic valve replacement   Allergies  Allergen Reactions  . Lexapro [Escitalopram Oxalate] Swelling    Patient Measurements: Height: _0  (177.8 cm)(based on previous notes) Weight: (!) 370 lb (167.8 kg) IBW/kg (Calculated) : 73 Heparin Dosing Weight: 111kg  Vital Signs: Temp: 100.6 F (38.1 C) (05/31 2000) Temp Source: Axillary (05/31 2000) BP: 119/78 (05/31 2100) Pulse Rate: 93 (05/31 2100)  Labs: Recent Labs    04/07/18 0546  04/07/18 1219  04/07/18 1546 04/07/18 1911 04/07/18 2218 04/07/18 2232  HGB 18.2*  --   --   --   --   --   --   --   HCT 54.7*  --   --   --   --   --   --   --   PLT 231  --   --   --   --   --   --   --   LABPROT 19.8*  --   --   --   --   --   --   --   INR 1.70  --   --   --   --   --   --   --   HEPARINUNFRC  --   --   --   --   --   --   --  0.33  CREATININE 2.50*   < > 2.71*  --   --  2.44* 2.48*  --   TROPONINI 0.03*   < >  --    < > 0.16* 0.17* 0.17*  --    < > = values in this interval not displayed.    Estimated Creatinine Clearance: 57.1 mL/min (A) (by C-G formula based on SCr of 2.48 mg/dL (H)).   Medical History: Past Medical History:  Diagnosis Date  . Allergy   . Anxiety   . Arrhythmia   . Arthritis   . Atrial fibrillation ([place])   . CHF (congestive heart failure) ([place])   . CKD (chronic kidney disease)   . Diabetes mellitus without complication ([place])   . Diabetes mellitus, type II ([place])   . Heart failure ([place])   . Hyperlipidemia   . Hypertension   . Pancreatitis   . Sleep apnea     Medications:  Scheduled:  . aspirin  324 mg Oral NOW   Or  . aspirin  300 mg Rectal NOW  . chlorhexidine gluconate (MEDLINE KIT)  15 mL Mouth Rinse BID  . fentaNYL (SUBLIMAZE) injection  50 mcg Intravenous Once  . mouth rinse  15 mL Mouth Rinse 10 times per day  . potassium  chloride  40 mEq Oral Q4H   Infusions:  . sodium chloride    . 0.9 % NaCl with KCl 20 mEq / L    . amiodarone 30 mg/hr (04/07/18 1800)  . dextrose 5 % and 0.45% NaCl    . DOPamine Stopped (04/07/18 0930)  . famotidine (PEPCID) IV Stopped (04/07/18 2154)  . fentaNYL infusion INTRAVENOUS 100 mcg/hr (04/07/18 1800)  . heparin 1,600 Units/hr (04/07/18 1800)  . insulin (NOVOLIN-R) infusion 19.4 Units/hr (04/07/18 2349)  . magnesium sulfate 1 - 4 g bolus IVPB Stopped (04/07/18 0624)  . norepinephrine (LEVOPHED) Adult infusion 22 mcg/min (04/07/18 2333)  . piperacillin-tazobactam (ZOSYN)  IV 3.375 g (04/07/18 2120)  . propofol (DIPRIVAN) infusion 50 mcg/kg/min (04/07/18 2328)    Assessment: Pharmacy  consulted for warfarin and heparin drip management for 47 yo male admitted to the ICU s/p cardiac arrest. Patient has history significant for aortic valve replacement, atrial fibrillation and CHF. Patient currently requiring amiodarone infusion and blood pressure support from norepinephrine. Head CT is negative for infarction or hemorrhage.   Patient takes warfarin 95m daily as an outpatient for goal INR 2.5-3.5.   Heparin infusing at 1600 units/hr.   Goal of Therapy:  INR 2-3 Heparin level 0.3-0.7 units/ml Monitor platelets by anticoagulation protocol: Yes   Plan:  Heparin:Heaprin level is in range will continue infusion at 1600 units/hr. Will obtain confirmatory heparin level at 0500.   Warfarin: plan is to hold warfarin until patient is more stable. Will tentatively plan to resume warfarin on 6/2. INR was subtherapeutic on admission at 1.7. Will obtain INR with am labs.   Pharmacy will continue to monitor and adjust per consult.   Simpson,Michael L 04/07/2018,11:49 PM

## 2018-04-07 NOTE — ED Provider Notes (Addendum)
[facility] Emergency Department Provider Note    First MD Initiated Contact with Patient 04/07/18 [phone]     (approximate)  I have reviewed the triage vital signs and the nursing notes.   HISTORY  Chief Complaint Back Pain   HPI Alexander Alexander is a 47 y.o. male with below list of chronic medical conditions including CHF CKD diabetes and sciatica presents to the emergency department with progressively worsening low back pain with radiation down left leg x2 months with progressive worsening over the course of this week.  Patient states that the pain associate with sciatica has been down his right leg in the past however as stated is been on the left leg for the last 2 months.  Patient admits to bilateral lower extremity weakness and numbness.  Patient admits to recent "falls" over the past "few weeks".   Past Medical History:  Diagnosis Date  . Allergy   . Anxiety   . Arrhythmia   . Arthritis   . Atrial fibrillation ([place])   . CHF (congestive heart failure) ([place])   . CKD (chronic kidney disease)   . Diabetes mellitus without complication ([place])   . Diabetes mellitus, type II ([place])   . Heart failure ([place])   . Hyperlipidemia   . Hypertension   . Pancreatitis   . Sleep apnea     Patient Active Problem List   Diagnosis Date Noted  . Cardiac arrest ([place]) 04/07/2018  . Chronic back pain 04/06/2018  . Adjustment disorder with depressed mood 03/29/2018  . Gout 02/16/2018  . Morbid obesity ([place]) 01/16/2018  . OSA (obstructive sleep apnea) 12/09/2017  . CKD (chronic kidney disease) stage 3, GFR 30-59 ml/min (HCC) 12/09/2017  . Diabetic neuropathy ([place]) 12/09/2017  . Hyperlipidemia associated with type 2 diabetes mellitus ([place]) 12/09/2017  . Anxiety 12/09/2017  . Atrial fibrillation with RVR ([place]) 12/06/2017  . Chronic combined systolic (congestive) and diastolic (congestive) heart failure ([place]) 11/25/2017  . HTN (hypertension) 11/25/2017  . T2DM (type  2 diabetes mellitus) ([place]) 11/25/2017  . Aortic valve replaced 11/25/2017  . Atrial fibrillation ([place]) 11/25/2017    Past Surgical History:  Procedure Laterality Date  . ABLATION  08/01/2017  . MECHANICAL AORTIC VALVE REPLACEMENT      Prior to Admission medications   Medication Sig Start Date End Date Taking? Authorizing Provider  acetaminophen (TYLENOL) 325 MG tablet Take 650 mg by mouth every 6 (six) hours as needed.    [provider]  allopurinol (ZYLOPRIM) 100 MG tablet TAKE 0.5 TABLETS (50 MG TOTAL) BY MOUTH DAILY. 03/10/18   Bacigalupo, Dionne Bucy, MD  ALPRAZolam Duanne Moron) 0.5 MG tablet Take 1 tablet (0.5 mg total) by mouth 2 (two) times daily. 01/16/18   Bacigalupo, Dionne Bucy, MD  colchicine 0.6 MG tablet TAKE 1 TABLET BY MOUTH EVERY DAY 04/04/18   Bacigalupo, Dionne Bucy, MD  fluvoxaMINE (LUVOX) 25 MG tablet Take 1 tablet (25 mg total) by mouth at bedtime. 04/05/18   Ursula Alert, MD  gabapentin (NEURONTIN) 600 MG tablet Take 1 tablet (600 mg total) by mouth 2 (two) times daily. 01/19/18   Virginia Crews, MD  glimepiride (AMARYL) 2 MG tablet Take 1 tablet (2 mg total) by mouth daily with breakfast. 12/09/17   Bacigalupo, Dionne Bucy, MD  insulin detemir (LEVEMIR) 100 UNIT/ML injection Inject 0.65 mLs (65 Units total) into the skin 2 (two) times daily. 01/16/18   Virginia Crews, MD  losartan (COZAAR) 100 MG tablet Take 1  tablet (100 mg total) by mouth daily. 01/16/18   Bacigalupo, Dionne Bucy, MD  metolazone (ZAROXOLYN) 2.5 MG tablet Take 1 tablet (2.5 mg total) by mouth 2 (two) times daily. 03/29/18   Virginia Crews, MD  metoprolol (TOPROL-XL) 200 MG 24 hr tablet Take 200 mg by mouth 2 (two) times daily. 12/22/17   [provider]  NOVOLOG 100 UNIT/ML injection Inject 35 Units into the skin 3 (three) times daily with meals. Patient taking differently: Inject 40 Units into the skin 3 (three) times daily with meals.  12/26/17   Virginia Crews, MD  torsemide  (DEMADEX) 20 MG tablet Take 2 tablets (40 mg total) by mouth 2 (two) times daily. 11/25/17 03/29/18  Alisa Graff, FNP  warfarin (COUMADIN) 5 MG tablet  04/05/18   [provider]    Allergies Lexapro [escitalopram oxalate]  Family History  Problem Relation Age of Onset  . CAD Mother   . Hypertension Mother   . Hypertension Father   . Asthma Father   . Dementia Father   . Diabetes Brother   . Depression Brother   . Hypertension Brother   . Hypertension Brother   . Depression Brother   . Pancreatic cancer Paternal Aunt        several aunts and uncles with pancreatic cancer on paternal side  . Breast cancer Maternal Aunt   . Colon cancer Neg Hx   . Prostate cancer Neg Hx     Social History Social History   Tobacco Use  . Smoking status: Former Smoker    Packs/day: 0.50    Years: 20.00    Pack years: 10.00    Types: Cigarettes    Last attempt to quit: 04/25/2014    Years since quitting: 3.9  . Smokeless tobacco: Never Used  . Tobacco comment: quit around june 2015  Substance Use Topics  . Alcohol use: No    Frequency: Never  . Drug use: Not Currently    Types: Cocaine    Comment: January 28, 2017 quit Cocaine    Review of Systems Constitutional: No fever/chills Eyes: No visual changes. ENT: No sore throat. Cardiovascular: Denies chest pain. Respiratory: Denies shortness of breath. Gastrointestinal: No abdominal pain.  No nausea, no vomiting.  No diarrhea.  No constipation. Genitourinary: Negative for dysuria. Musculoskeletal: Negative for neck pain.  Positive for low back pain  integumentary: Negative for rash. Neurological: Negative for headaches, focal weakness or numbness.  ____________________________________________   PHYSICAL EXAM:  VITAL SIGNS: ED Triage Vitals [04/07/18 0222]  Enc Vitals Group     BP (!) 143/107     Pulse Rate 79     Resp 18     Temp (!) 97.5 F (36.4 C)     Temp Source Oral     SpO2 94 %     Weight (!) 167.8 kg  (370 lb)     Height      Head Circumference      Peak Flow      Pain Score 10     Pain Loc      Pain Edu?      Excl. in [place]?     Constitutional: Alert and oriented.  Apparent discomfort  eyes: Conjunctivae are normal.  Head: Atraumatic. Mouth/Throat: Mucous membranes are moist.  Oropharynx non-erythematous. Neck: No stridor.   Cardiovascular: Normal rate, regular rhythm. Good peripheral circulation. Grossly normal heart sounds. Respiratory: Normal respiratory effort.  No retractions. Lungs CTAB. Gastrointestinal: Soft and nontender. No  distention.  Musculoskeletal: No lower extremity tenderness nor edema. No gross deformities of extremities.  Pain to palpation L4-S1 Neurologic:  Normal speech and language. No gross focal neurologic deficits are appreciated.  Skin:  Skin is warm, dry and intact. No rash noted. Psychiatric: Mood and affect are normal. Speech and behavior are normal.  ____________________________________________   LABS (all labs ordered are listed, but only abnormal results are displayed)  Labs Reviewed  CBC  COMPREHENSIVE METABOLIC PANEL  TROPONIN I  BRAIN NATRIURETIC PEPTIDE   ____________________________________________  EKG  ED ECG REPORT I, [place], the attending physician, personally viewed and interpreted this ECG.   Date: 04/07/2018  EKG Time: 5:29 AM  Rate: 115  Rhythm: Sinus tachycardia  Axis: Normal  Intervals: Normal  ST&T Change: None     .Critical Care Performed by: Gregor Hams, MD Authorized by: Gregor Hams, MD   Critical care provider statement:    Critical care time (minutes):  60   Critical care time was exclusive of:  Separately billable procedures and treating other patients   Critical care was necessary to treat or prevent imminent or life-threatening deterioration of the following conditions:  Cardiac failure   Critical care was time spent personally by me on the following activities:  Development of  treatment plan with patient or surrogate, discussions with consultants, evaluation of patient's response to treatment, examination of patient, obtaining history from patient or surrogate, ordering and performing treatments and interventions, ordering and review of laboratory studies, ordering and review of radiographic studies, pulse oximetry, re-evaluation of patient's condition and review of old charts   I assumed direction of critical care for this patient from another provider in my specialty: no   Procedure Name: Intubation Date/Time: 04/07/2018 7:27 AM Performed by: Gregor Hams, MD Pre-anesthesia Checklist: Patient identified, Emergency Drugs available, Suction available and Patient being monitored Preoxygenation: Pre-oxygenation with 100% oxygen Induction Type: IV induction, Rapid sequence and Cricoid Pressure applied Laryngoscope Size: Mac and 4 Tube size: 7.5 mm Number of attempts: 1 Placement Confirmation: ETT inserted through vocal cords under direct vision,  CO2 detector and Breath sounds checked- equal and bilateral Secured at: 23 cm Dental Injury: Teeth and Oropharynx as per pre-operative assessment  Difficulty Due To: Difficulty was anticipated    .Central Line Date/Time: 04/07/2018 8:13 AM Performed by: Gregor Hams, MD Authorized by: Gregor Hams, MD   Consent:    Consent obtained:  Emergent situation   Consent given by:  Spouse Pre-procedure details:    Hand hygiene: Hand hygiene performed prior to insertion     Sterile barrier technique: All elements of maximal sterile technique followed     Skin preparation:  2% chlorhexidine   Skin preparation agent: Skin preparation agent completely dried prior to procedure   Procedure details:    Location:  R internal jugular   Patient position:  Trendelenburg   Procedural supplies:  Triple lumen   Landmarks identified: yes     Ultrasound guidance: yes     Sterile ultrasound techniques: Sterile gel and sterile  probe covers were used     Number of attempts:  1   Successful placement: yes   Post-procedure details:    Post-procedure:  Dressing applied and line sutured   Assessment:  Blood return through all ports and free fluid flow   Patient tolerance of procedure:  Tolerated well, no immediate complications     ____________________________________________   INITIAL IMPRESSION / ASSESSMENT AND PLAN / ED COURSE  As part of my medical decision making, I reviewed the following data within the electronic MEDICAL RECORD NUMBER  47 year old male presenting to the emergency department above-stated history and physical exam consistent with sciatica.  As such MRI lumbar spine was ordered.  Patient went to MRI and before the study began became abruptly unresponsive and noted to be pulseless.  I responded to the MRI promptly CPR initiated as the patient was pulseless I intubated the patient while in MRI.  Patient received ACLS drugs as indicated namely epinephrine 1 bolus with return of circulation.  On return to the room the patient had an episode of ventricular tachycardia and as such a amiodarone bolus and infusion was initiated.  Patient sedated with propofol.  Patient subsequently became hypotensive and as such propofol and amiodarone held however patient remained hypotensive.  IV Dopamine initiated however patient remained hypotensive and as such Levophed was initiated as well.  Patient current blood pressure 117/95.  Propofol and amiodarone infusion continued.  I spoke with the patient's wife will inform me that the patient has had multiple falls that she does not believe to be secondary to loss of balance or accidental.  She describes one episode where the patient was in the shower and she found them half in the bath half on the bathroom floor.  Patient was not evaluated by Dr. following these incidents.  She states that she is aware of at least 3 incidents in the past week.  Given this history concern for  arrhythmia as etiology of the patient's presumed syncopal episodes and cardiac arrest today.  Patient discussed with Dr. Jannifer Franklin for hospital admission.    ____________________________________________  FINAL CLINICAL IMPRESSION(S) / ED DIAGNOSES  Final diagnoses:  Endotracheally intubated     MEDICATIONS GIVEN DURING THIS VISIT:  Medications  amiodarone (NEXTERONE PREMIX) 360-4.14 MG/200ML-% (1.8 mg/mL) IV infusion (has no administration in time range)  morphine 4 MG/ML injection 4 mg (4 mg Intravenous Given 04/07/18 0418)  ondansetron (ZOFRAN) injection 4 mg (4 mg Intravenous Given 04/07/18 0418)  propofol (DIPRIVAN) 1000 MG/100ML infusion (20 mcg/kg/min  167.8 kg Intravenous New Bag/Given 04/07/18 0528)  amiodarone (NEXTERONE) 1.8 mg/mL load via infusion 150 mg (150 mg Intravenous Bolus from Bag 04/07/18 0541)     ED Discharge Orders    None       Note:  This document was prepared using Dragon voice recognition software and may include unintentional dictation errors.    Gregor Hams, MD 04/07/18 Wonda Amis    Gregor Hams, MD 04/07/18 [phone]

## 2018-04-07 NOTE — H&P (Signed)
[facility] - [place] at [facility]   PATIENT NAME: Alexander Alexander    MR#:  [id number]  DATE OF BIRTH:  Mar 02, 1971  DATE OF ADMISSION:  04/07/2018  PRIMARY CARE PHYSICIAN: Erasmo Downer, MD   REQUESTING/REFERRING PHYSICIAN: Manson Passey, MD  CHIEF COMPLAINT:   Chief Complaint  Patient presents with  . Back Pain    HISTORY OF PRESENT ILLNESS:  Alexander Alexander  is a 47 y.o. male who presents with complaint of low back pain.  Patient is a known history of sciatica, medicated for similar complaint of low back pain with radiation down his legs.  Initially he told ED physician and the pain was much worse than it has been for him, and so MRI was ordered.  While patient was in radiology department waiting to start MRI he had a cardiac arrest.  CPR was administered per ACLS protocol and ROSC was obtained.  Patient was intubated.  Hospitalist were called for admission.  PAST MEDICAL HISTORY:   Past Medical History:  Diagnosis Date  . Allergy   . Anxiety   . Arrhythmia   . Arthritis   . Atrial fibrillation (HCC)   . CHF (congestive heart failure) (HCC)   . CKD (chronic kidney disease)   . Diabetes mellitus without complication (HCC)   . Diabetes mellitus, type II (HCC)   . Heart failure (HCC)   . Hyperlipidemia   . Hypertension   . Pancreatitis   . Sleep apnea      PAST SURGICAL HISTORY:   Past Surgical History:  Procedure Laterality Date  . ABLATION  08/01/2017  . MECHANICAL AORTIC VALVE REPLACEMENT       SOCIAL HISTORY:   Social History   Tobacco Use  . Smoking status: Former Smoker    Packs/day: 0.50    Years: 20.00    Pack years: 10.00    Types: Cigarettes    Last attempt to quit: 04/25/2014    Years since quitting: 3.9  . Smokeless tobacco: Never Used  . Tobacco comment: quit around june 2015  Substance Use Topics  . Alcohol use: No    Frequency: Never     FAMILY HISTORY:   Family History  Problem Relation Age of Onset  . CAD  Mother   . Hypertension Mother   . Hypertension Father   . Asthma Father   . Dementia Father   . Diabetes Brother   . Depression Brother   . Hypertension Brother   . Hypertension Brother   . Depression Brother   . Pancreatic cancer Paternal Aunt        several aunts and uncles with pancreatic cancer on paternal side  . Breast cancer Maternal Aunt   . Colon cancer Neg Hx   . Prostate cancer Neg Hx      DRUG ALLERGIES:   Allergies  Allergen Reactions  . Lexapro [Escitalopram Oxalate] Swelling    MEDICATIONS AT HOME:   Prior to Admission medications   Medication Sig Start Date End Date Taking? Authorizing Provider  acetaminophen (TYLENOL) 325 MG tablet Take 650 mg by mouth every 6 (six) hours as needed.    [provider]  allopurinol (ZYLOPRIM) 100 MG tablet TAKE 0.5 TABLETS (50 MG TOTAL) BY MOUTH DAILY. 03/10/18   Bacigalupo, Marzella Schlein, MD  ALPRAZolam Prudy Feeler) 0.5 MG tablet Take 1 tablet (0.5 mg total) by mouth 2 (two) times daily. 01/16/18   Bacigalupo, Marzella Schlein, MD  colchicine 0.6 MG tablet TAKE 1 TABLET BY MOUTH  EVERY DAY 04/04/18   Erasmo Downer, MD  fluvoxaMINE (LUVOX) 25 MG tablet Take 1 tablet (25 mg total) by mouth at bedtime. 04/05/18   Jomarie Longs, MD  gabapentin (NEURONTIN) 600 MG tablet Take 1 tablet (600 mg total) by mouth 2 (two) times daily. 01/19/18   Erasmo Downer, MD  glimepiride (AMARYL) 2 MG tablet Take 1 tablet (2 mg total) by mouth daily with breakfast. 12/09/17   Bacigalupo, Marzella Schlein, MD  insulin detemir (LEVEMIR) 100 UNIT/ML injection Inject 0.65 mLs (65 Units total) into the skin 2 (two) times daily. 01/16/18   Bacigalupo, Marzella Schlein, MD  losartan (COZAAR) 100 MG tablet Take 1 tablet (100 mg total) by mouth daily. 01/16/18   Bacigalupo, Marzella Schlein, MD  metolazone (ZAROXOLYN) 2.5 MG tablet Take 1 tablet (2.5 mg total) by mouth 2 (two) times daily. 03/29/18   Erasmo Downer, MD  metoprolol (TOPROL-XL) 200 MG 24 hr tablet Take 200 mg by  mouth 2 (two) times daily. 12/22/17   [provider]  NOVOLOG 100 UNIT/ML injection Inject 35 Units into the skin 3 (three) times daily with meals. Patient taking differently: Inject 40 Units into the skin 3 (three) times daily with meals.  12/26/17   Erasmo Downer, MD  torsemide (DEMADEX) 20 MG tablet Take 2 tablets (40 mg total) by mouth 2 (two) times daily. 11/25/17 03/29/18  Delma Freeze, FNP  warfarin (COUMADIN) 5 MG tablet  04/05/18   [provider]    REVIEW OF SYSTEMS:  Review of Systems  Unable to perform ROS: Critical illness     VITAL SIGNS:   Vitals:   04/07/18 0635 04/07/18 0640 04/07/18 0645 04/07/18 0650  BP: 99/74 107/66 123/67 108/70  Pulse: 82 88 85 92  Resp: (!) 21  Temp:      TempSrc:      SpO2: 97% 98% 99% 100%  Weight:       Wt Readings from Last 3 Encounters:  04/07/18 (!) 167.8 kg (370 lb)  03/29/18 (!) 171 kg (377 lb)  02/16/18 (!) 173.7 kg (383 lb)    PHYSICAL EXAMINATION:  Physical Exam  Vitals reviewed. Constitutional: He appears well-developed and well-nourished. No distress.  HENT:  Head: Normocephalic and atraumatic.  Mouth/Throat: Oropharynx is clear and moist.  Eyes: Pupils are equal, round, and reactive to light. Conjunctivae and EOM are normal. No scleral icterus.  Neck: Normal range of motion. Neck supple. No JVD present. No thyromegaly present.  Cardiovascular: Normal rate and intact distal pulses. Exam reveals no gallop and no friction rub.  Murmur heard. Irregular rhythm  Respiratory: Effort normal and breath sounds normal. No respiratory distress (Intubated). He has no wheezes. He has no rales.  GI: Soft. Bowel sounds are normal. He exhibits no distension. There is no tenderness.  Musculoskeletal: Normal range of motion. He exhibits no edema.  No arthritis, no gout  Lymphadenopathy:    He has no cervical adenopathy.  Neurological:  Unable to fully assess due to critical illness  Skin: Skin is  warm and dry. No rash noted. No erythema.  Psychiatric:  Unable to assess due to critical illness    LABORATORY PANEL:   CBC Recent Labs  Lab 04/07/18 0546  WBC 9.4  HGB 18.2*  HCT 54.7*  PLT 231   ------------------------------------------------------------------------------------------------------------------  Chemistries  Recent Labs  Lab 04/07/18 0546  NA 138  K 4.4  CL 91*  CO2 29  GLUCOSE 296*  BUN 63*  CREATININE 2.50*  CALCIUM 8.5*  AST 67*  ALT 42  ALKPHOS 84  BILITOT 1.0   ------------------------------------------------------------------------------------------------------------------  Cardiac Enzymes Recent Labs  Lab 04/07/18 0546  TROPONINI 0.03*   ------------------------------------------------------------------------------------------------------------------  RADIOLOGY:  Dg Chest 1 View  Result Date: 04/07/2018 CLINICAL DATA:  47 y/o  M; intubated. EXAM: CHEST  1 VIEW COMPARISON:  12/06/2017 chest radiograph FINDINGS: Stable cardiomegaly given projection and technique. Transcutaneous pacing pads. Post median sternotomy with wires in alignment. Low lung volumes. Pulmonary vascular congestion. No focal consolidation. Endotracheal tube tip 2.4 cm above the carina. Enteric tube tip appears to project over the lower esophagus, advancement recommended. IMPRESSION: 1. Endotracheal tube tip 2.4 cm above the carina. 2. Cardiomegaly and pulmonary vascular congestion. 3. Enteric tube tip appears to project over the lower esophagus, advancement recommended. Electronically Signed   By: Mitzi Hansen M.D.   On: 04/07/2018 06:22    EKG:   Orders placed or performed during the hospital encounter of 04/07/18  . EKG 12-Lead  . EKG 12-Lead  . EKG 12-Lead  . EKG 12-Lead    IMPRESSION AND PLAN:  Principal Problem:   Cardiac arrest [facility]) -unclear etiology for his cardiac arrest in terms of acute pathology.  This seems likely to have been a  spontaneous arrest, perhaps due to underlying arrhythmias in the setting of significant underlying chronic disease.  Seems to be completely unrelated to his chief complaint.  He was intubated during his code event, and is being sent to the ICU for further treatment.  He has been placed on antiarrhythmics, and cardiology will be consulted. Active Problems:   Chronic combined systolic (congestive) and diastolic (congestive) heart failure (HCC) -also potentially causing some arrhythmias, including his ventricular rhythm which may have been responsible for his cardiac arrest.  He had significant runs of V. tach in the ED postarrest.  Continue home meds, consult cardiology   HTN (hypertension) -hold home dose antihypertensives for now as the antiarrhythmics and his code event have dropped his blood pressure.  Patient is currently on IV pressors.   T2DM (type 2 diabetes mellitus) (HCC) -sliding scale insulin with corresponding glucose checks   Atrial fibrillation (HCC) -continue rate controlling medications as well as anticoagulation   Aortic valve replaced -continue anticoagulation   OSA (obstructive sleep apnea) -currently intubated, once he is extubated he will need CPAP nightly   CKD (chronic kidney disease) stage 3, GFR 30-59 ml/min (HCC) -at baseline, monitor and avoid nephrotoxins   Hyperlipidemia associated with type 2 diabetes mellitus (HCC) -continue home dose antilipid  Chart review performed and case discussed with ED provider. Labs, imaging and/or ECG reviewed by provider and discussed with patient/family. Management plans discussed with the patient and/or family.  DVT PROPHYLAXIS: Systemic anticoagulation  GI PROPHYLAXIS: H2 blocker  ADMISSION STATUS: Inpatient  CODE STATUS: Full Code Status History    Date Active Date Inactive Code Status Order ID Comments User Context   12/06/2017 1513 12/08/2017 1635 Full Code [id number]  Altamese Dilling, MD ED   11/16/2017 1637 11/17/2017 1850  Full Code [id number]  Altamese Dilling, MD Inpatient      TOTAL CRITICAL CARE TIME TAKING CARE OF THIS PATIENT: 50 minutes.   Micalah Cabezas FIELDING 04/07/2018, 6:52 AM  [facility]  Office  [phone]  CC: Primary care physician; Erasmo Downer, MD  Note:  This document was prepared using Dragon voice recognition software and may include unintentional dictation errors.

## 2018-04-07 NOTE — ED Notes (Signed)
MD Manson Passey at bedsdie

## 2018-04-07 NOTE — ED Notes (Signed)
Central line placed by Dr. Manson Passey.  Patient tolerated well.

## 2018-04-07 NOTE — Consult Note (Signed)
Name: Alexander Alexander MRN: [id number] DOB: 1971-10-22     CONSULTATION DATE: 04/07/2018 47 years old man with history of atrial fibrillation status post ablation and mechanical aortic valve replacement on warfarin, congestive heart failure, chronic kidney disease, morbid obesity, diabetes mellitus, dyslipidemia, obstructive sleep apnea, hypertension.  Patient presented to the ED complaining of low back pain and sciatica, while an MRI suite he suffered a pulseless V. tach cardiac arrest was 5 minutes thoracic with only 1 round of epi.  He had to be started on dopamine and levo fed because of hypotension, was found to be neurologically intact the following command, had to be intubated and was started on propofol for sedation.  Right IJ triple-lumen was placed by ED physician Patient arrived to ICU intubated on ventilator 100%, sedated with propofol the rest of -4, on levofed, dopamine and Amiodaron.  All history was obtained from ED provider and EMR. PAST MEDICAL HISTORY :   has a past medical history of Allergy, Anxiety, Arrhythmia, Arthritis, Atrial fibrillation (HCC), CHF (congestive heart failure) (HCC), CKD (chronic kidney disease), Diabetes mellitus without complication (HCC), Diabetes mellitus, type II (HCC), Heart failure (HCC), Hyperlipidemia, Hypertension, Pancreatitis, and Sleep apnea.  has a past surgical history that includes Ablation (08/01/2017) and Mechanical aortic valve replacement. Prior to Admission medications   Medication Sig Start Date End Date Taking? Authorizing Provider  allopurinol (ZYLOPRIM) 100 MG tablet TAKE 0.5 TABLETS (50 MG TOTAL) BY MOUTH DAILY. 03/10/18  Yes Bacigalupo, [name], MD  ALPRAZolam Prudy Feeler(XANAX) 0.5 MG tablet Take 1 tablet (0.5 mg total) by mouth 2 (two) times daily. 01/16/18  Yes Bacigalupo, [name], MD  colchicine 0.6 MG tablet TAKE 1 TABLET BY MOUTH EVERY DAY 04/04/18  Yes Bacigalupo, [name], MD  fluvoxaMINE (LUVOX) 25 MG tablet Take 1 tablet (25 mg total)  by mouth at bedtime. 04/05/18  Yes Jomarie LongsEappen, Saramma, MD  gabapentin (NEURONTIN) 600 MG tablet Take 1 tablet (600 mg total) by mouth 2 (two) times daily. 01/19/18  Yes Bacigalupo, [name], MD  glimepiride (AMARYL) 2 MG tablet Take 1 tablet (2 mg total) by mouth daily with breakfast. 12/09/17  Yes Bacigalupo, [name], MD  insulin detemir (LEVEMIR) 100 UNIT/ML injection Inject 0.65 mLs (65 Units total) into the skin 2 (two) times daily. 01/16/18  Yes Bacigalupo, [name], MD  losartan (COZAAR) 100 MG tablet Take 1 tablet (100 mg total) by mouth daily. 01/16/18  Yes Bacigalupo, [name], MD  metolazone (ZAROXOLYN) 2.5 MG tablet Take 1 tablet (2.5 mg total) by mouth 2 (two) times daily. 03/29/18  Yes Bacigalupo, [name], MD  metoprolol (TOPROL-XL) 200 MG 24 hr tablet Take 200 mg by mouth daily.  12/22/17  Yes [provider]  NOVOLOG 100 UNIT/ML injection Inject 35 Units into the skin 3 (three) times daily with meals. Patient taking differently: Inject 40 Units into the skin 3 (three) times daily with meals.  12/26/17  Yes Bacigalupo, [name], MD  torsemide (DEMADEX) 20 MG tablet Take 2 tablets (40 mg total) by mouth 2 (two) times daily. Patient taking differently: Take 20 mg by mouth 2 (two) times daily.  11/25/17 04/07/18 Yes Clarisa KindredHackney, Tina A, FNP  warfarin (COUMADIN) 5 MG tablet Take 5 mg by mouth daily at 6 PM.  04/05/18  Yes [provider]  acetaminophen (TYLENOL) 325 MG tablet Take 650 mg by mouth every 6 (six) hours as needed.    [provider]   Allergies  Allergen Reactions  . Lexapro [  Escitalopram Oxalate] Swelling    FAMILY HISTORY:  family history includes Asthma in his father; Breast cancer in his maternal aunt; CAD in his mother; Dementia in his father; Depression in his brother and brother; Diabetes in his brother; Hypertension in his brother, brother, father, and mother; Pancreatic cancer in his paternal aunt. SOCIAL HISTORY:  reports that he quit smoking about 3  years ago. His smoking use included cigarettes. He has a 10.00 pack-year smoking history. He has never used smokeless tobacco. He reports that he has current or past drug history. Drug: Cocaine. He reports that he does not drink alcohol.  REVIEW OF SYSTEMS:   Unable to obtain due to critical illness   VITAL SIGNS: Temp:  [97.5 F (36.4 C)] 97.5 F (36.4 C) (05/31 0222) Pulse Rate:  [30-156] 76 (05/31 0900) Resp:  [14-54] 21 (05/31 0900) BP: (52-173)/(19-130) 153/130 (05/31 0900) SpO2:  [87 %-100 %] 93 % (05/31 0932) FiO2 (%):  [100 %] 100 % (05/31 0932) Weight:  [167.8 kg (370 lb)] 167.8 kg (370 lb) (05/31 0222)  Physical Examination:  Sedated with propofol to RASS -4 On vent, no distress, bilateral equal air entry and no adventitious sounds S1 & S2 are audible, no murmur Obese benign abdomen with febrile peristalsis No peripheral edema  ASSESSMENT / PLAN:  Acute respiratory failure with hypoxemia status post cardiac arrest. -Monitor ABG, optimize ventilator settings and continuous vent support.  Pulseless V. tach cardiac arrest -Amiodarone, monitor electrolytes, CI, ECHO and follow with cardiology consult.  Altered mental status s/p cardiac arrest with being able to move extremities purposefully however not following commands when off sedation. Not candidate for hypothermia. -CT head to r/o intracranial abnormality. -Monitor neuro status.  Atelectasis and pneumonia with possible aspiration. Bil airspace disease -Empiric Zosyn, monitor CXR + CBC + FIO2.  CHF with questionable pulmonary congestion and cardiogenic shock s/p cardiac arrest. ECHO 11/2017 LVEF 45 % with diffuse hypokinesis. -Optimize pressers (c/w Levophed and taper off Dopamine) and monitor hemodynamics.   AKI on CKD and intravascular volume depletion -Optimize volume, avoid nephrotoxins, monitor renal panel and urine out put.  DKA -Glycemic control, monitor AG and B-Hydroxybuterates.  Elevated LFT with  possible acute ischemic hepatitis s/p cardiac arrest -Monitor LFT and consider liver U/U if no improvement.  Hemoconcentration with intravascular volume depletion -Optimize volume and monitor HB.  Coagulopathy with Warfarin and subtherapeutic INR.  S/p Mechanical aortic valve replacement. -Optimize anticoagulation, bridge with Heparin after obtaining CT head.  Full code  DVT & GI prophylaxis. Continue with supportive care  Critical care time 60 min

## 2018-04-08 ENCOUNTER — Inpatient Hospital Stay: Payer: Medicare HMO

## 2018-04-08 LAB — BASIC METABOLIC PANEL
ANION GAP: 9 (ref 5–15)
ANION GAP: 9 (ref 5–15)
Anion gap: 12 (ref 5–15)
Anion gap: 13 (ref 5–15)
BUN: 45 mg/dL — AB (ref 6–20)
BUN: 43 mg/dL — ABNORMAL HIGH (ref 6–20)
BUN: 38 mg/dL — ABNORMAL HIGH (ref 6–20)
BUN: 34 mg/dL — ABNORMAL HIGH (ref 6–20)
CALCIUM: 8.2 mg/dL — AB (ref 8.9–10.3)
CALCIUM: 7.7 mg/dL — AB (ref 8.9–10.3)
CALCIUM: 7.5 mg/dL — AB (ref 8.9–10.3)
CHLORIDE: 98 mmol/L — AB (ref 101–111)
CHLORIDE: 101 mmol/L (ref 101–111)
CO2: 31 mmol/L (ref 22–32)
CO2: 29 mmol/L (ref 22–32)
CO2: 30 mmol/L (ref 22–32)
CO2: 29 mmol/L (ref 22–32)
CREATININE: 2.31 mg/dL — AB (ref 0.61–1.24)
CREATININE: 2.33 mg/dL — AB (ref 0.61–1.24)
Calcium: 8.3 mg/dL — ABNORMAL LOW (ref 8.9–10.3)
Chloride: 98 mmol/L — ABNORMAL LOW (ref 101–111)
Chloride: 100 mmol/L — ABNORMAL LOW (ref 101–111)
Creatinine, Ser: 2.39 mg/dL — ABNORMAL HIGH (ref 0.61–1.24)
Creatinine, Ser: 2.34 mg/dL — ABNORMAL HIGH (ref 0.61–1.24)
GFR calc Af Amer: 37 mL/min — ABNORMAL LOW (ref >60)
GFR calc Af Amer: 36 mL/min — ABNORMAL LOW (ref >60)
GFR calc non Af Amer: 30 mL/min — ABNORMAL LOW (ref >60)
GFR calc non Af Amer: 32 mL/min — ABNORMAL LOW (ref >60)
GFR calc non Af Amer: 31 mL/min — ABNORMAL LOW (ref >60)
GFR, EST AFRICAN AMERICAN: 35 mL/min — AB (ref >60)
GFR, EST AFRICAN AMERICAN: 36 mL/min — AB (ref >60)
GFR, EST NON AFRICAN AMERICAN: 31 mL/min — AB (ref >60)
GLUCOSE: 209 mg/dL — AB (ref 65–99)
GLUCOSE: 195 mg/dL — AB (ref 65–99)
Glucose, Bld: 214 mg/dL — ABNORMAL HIGH (ref 65–99)
Glucose, Bld: 129 mg/dL — ABNORMAL HIGH (ref 65–99)
POTASSIUM: 3.4 mmol/L — AB (ref 3.5–5.1)
Potassium: 3.7 mmol/L (ref 3.5–5.1)
Potassium: 3.5 mmol/L (ref 3.5–5.1)
Potassium: 3.1 mmol/L — ABNORMAL LOW (ref 3.5–5.1)
SODIUM: 141 mmol/L (ref 135–145)
SODIUM: 138 mmol/L (ref 135–145)
Sodium: 140 mmol/L (ref 135–145)
Sodium: 140 mmol/L (ref 135–145)

## 2018-04-08 LAB — GLUCOSE, CAPILLARY
GLUCOSE-CAPILLARY: 141 mg/dL — AB (ref 65–99)
GLUCOSE-CAPILLARY: 158 mg/dL — AB (ref 65–99)
GLUCOSE-CAPILLARY: 184 mg/dL — AB (ref 65–99)
GLUCOSE-CAPILLARY: 186 mg/dL — AB (ref 65–99)
GLUCOSE-CAPILLARY: 222 mg/dL — AB (ref 65–99)
GLUCOSE-CAPILLARY: 269 mg/dL — AB (ref 65–99)
GLUCOSE-CAPILLARY: 298 mg/dL — AB (ref 65–99)
Glucose-Capillary: 283 mg/dL — ABNORMAL HIGH (ref 65–99)
Glucose-Capillary: 235 mg/dL — ABNORMAL HIGH (ref 65–99)
Glucose-Capillary: 242 mg/dL — ABNORMAL HIGH (ref 65–99)
Glucose-Capillary: 211 mg/dL — ABNORMAL HIGH (ref 65–99)
Glucose-Capillary: 232 mg/dL — ABNORMAL HIGH (ref 65–99)
Glucose-Capillary: 233 mg/dL — ABNORMAL HIGH (ref 65–99)
Glucose-Capillary: 198 mg/dL — ABNORMAL HIGH (ref 65–99)
Glucose-Capillary: 173 mg/dL — ABNORMAL HIGH (ref 65–99)
Glucose-Capillary: 126 mg/dL — ABNORMAL HIGH (ref 65–99)
Glucose-Capillary: 134 mg/dL — ABNORMAL HIGH (ref 65–99)
Glucose-Capillary: 120 mg/dL — ABNORMAL HIGH (ref 65–99)
Glucose-Capillary: 237 mg/dL — ABNORMAL HIGH (ref 65–99)

## 2018-04-08 LAB — CALCIUM, IONIZED: CALCIUM, IONIZED, SERUM: 4.1 mg/dL — AB (ref 4.5–5.6)

## 2018-04-08 LAB — URINALYSIS, COMPLETE (UACMP) WITH MICROSCOPIC
Bacteria, UA: NONE SEEN
Bilirubin Urine: NEGATIVE
GLUCOSE, UA: NEGATIVE mg/dL
KETONES UR: NEGATIVE mg/dL
Nitrite: NEGATIVE
PH: 5 (ref 5.0–8.0)
Protein, ur: NEGATIVE mg/dL
Specific Gravity, Urine: 1.012 (ref 1.005–1.030)

## 2018-04-08 LAB — BLOOD GAS, ARTERIAL
Acid-Base Excess: 7.4 mmol/L — ABNORMAL HIGH (ref 0.0–2.0)
Bicarbonate: 34.2 mmol/L — ABNORMAL HIGH (ref 20.0–28.0)
FIO2: 70
LHR: 18 {breaths}/min
O2 Saturation: 94.4 %
PEEP: 5 cmH2O
Patient temperature: 37
VT: 550 mL
pCO2 arterial: 54 mmHg — ABNORMAL HIGH (ref 32.0–48.0)
pH, Arterial: 7.41 (ref 7.350–7.450)
pO2, Arterial: 72 mmHg — ABNORMAL LOW (ref 83.0–108.0)

## 2018-04-08 LAB — MAGNESIUM: MAGNESIUM: 1.8 mg/dL (ref 1.7–2.4)

## 2018-04-08 LAB — HEPATIC FUNCTION PANEL
ALBUMIN: 3.5 g/dL (ref 3.5–5.0)
ALT: 34 U/L (ref 17–63)
AST: 41 U/L (ref 15–41)
Alkaline Phosphatase: 49 U/L (ref 38–126)
BILIRUBIN DIRECT: 0.4 mg/dL (ref 0.1–0.5)
BILIRUBIN TOTAL: 1.7 mg/dL — AB (ref 0.3–1.2)
Indirect Bilirubin: 1.3 mg/dL — ABNORMAL HIGH (ref 0.3–0.9)
Total Protein: 7.5 g/dL (ref 6.5–8.1)

## 2018-04-08 LAB — ECHOCARDIOGRAM COMPLETE
Height: 70 in
Weight: 5920 oz

## 2018-04-08 LAB — TROPONIN I: Troponin I: 0.16 ng/mL (ref <0.03)

## 2018-04-08 LAB — PROCALCITONIN: Procalcitonin: 1.1 ng/mL

## 2018-04-08 LAB — PHOSPHORUS: Phosphorus: 2.1 mg/dL — ABNORMAL LOW (ref 2.5–4.6)

## 2018-04-08 LAB — PROTIME-INR
INR: 1.77
Prothrombin Time: 20.5 seconds — ABNORMAL HIGH (ref 11.4–15.2)

## 2018-04-08 LAB — MRSA PCR SCREENING: MRSA by PCR: NEGATIVE

## 2018-04-08 LAB — HEPARIN LEVEL (UNFRACTIONATED): Heparin Unfractionated: 0.36 IU/mL (ref 0.30–0.70)

## 2018-04-08 MED ORDER — POTASSIUM CHLORIDE 20 MEQ PO PACK
40.0000 meq | PACK | ORAL | Status: AC
  Administered 2018-04-08 – 2018-04-09 (×2): 40 meq via ORAL
  Filled 2018-04-08 (×2): qty 2

## 2018-04-08 MED ORDER — KCL IN DEXTROSE-NACL 20-5-0.45 MEQ/L-%-% IV SOLN
INTRAVENOUS | Status: DC
  Administered 2018-04-08: 11:00:00 via INTRAVENOUS
  Filled 2018-04-08 (×3): qty 1000

## 2018-04-08 MED ORDER — POTASSIUM CHLORIDE IN NACL 20-0.9 MEQ/L-% IV SOLN
INTRAVENOUS | Status: DC
  Administered 2018-04-08: 23:00:00 via INTRAVENOUS
  Filled 2018-04-08 (×2): qty 1000

## 2018-04-08 MED ORDER — INSULIN GLARGINE 100 UNIT/ML ~~LOC~~ SOLN
24.0000 [IU] | Freq: Every day | SUBCUTANEOUS | Status: DC
  Administered 2018-04-08: 24 [IU] via SUBCUTANEOUS
  Filled 2018-04-08 (×2): qty 0.24

## 2018-04-08 MED ORDER — FUROSEMIDE 10 MG/ML IJ SOLN
20.0000 mg | Freq: Two times a day (BID) | INTRAMUSCULAR | Status: DC
Start: 2018-04-08 — End: 2018-04-09
  Administered 2018-04-08 – 2018-04-09 (×3): 20 mg via INTRAVENOUS
  Filled 2018-04-08 (×3): qty 2

## 2018-04-08 MED ORDER — FAMOTIDINE 20 MG PO TABS
20.0000 mg | ORAL_TABLET | Freq: Two times a day (BID) | ORAL | Status: DC
  Administered 2018-04-08 – 2018-04-17 (×19): 20 mg
  Filled 2018-04-08 (×19): qty 1

## 2018-04-08 MED ORDER — POTASSIUM CHLORIDE CRYS ER 20 MEQ PO TBCR: 40.0000 meq | EXTENDED_RELEASE_TABLET | ORAL | Status: DC

## 2018-04-08 MED ORDER — POTASSIUM PHOSPHATES 15 MMOLE/5ML IV SOLN
20.0000 mmol | Freq: Once | INTRAVENOUS | Status: AC
  Administered 2018-04-08: 20 mmol via INTRAVENOUS
  Filled 2018-04-08: qty 6.67

## 2018-04-08 MED ORDER — ACETAMINOPHEN 325 MG PO TABS
650.0000 mg | ORAL_TABLET | Freq: Four times a day (QID) | ORAL | Status: DC | PRN
  Administered 2018-04-08 – 2018-04-28 (×18): 650 mg via ORAL
  Filled 2018-04-08 (×18): qty 2

## 2018-04-08 MED ORDER — INSULIN ASPART 100 UNIT/ML ~~LOC~~ SOLN
0.0000 [IU] | SUBCUTANEOUS | Status: DC
  Administered 2018-04-09: 3 [IU] via SUBCUTANEOUS
  Administered 2018-04-09: 9 [IU] via SUBCUTANEOUS
  Filled 2018-04-08 (×2): qty 1

## 2018-04-08 MED ORDER — MAGNESIUM SULFATE IN D5W 1-5 GM/100ML-% IV SOLN
1.0000 g | Freq: Once | INTRAVENOUS | Status: AC
  Administered 2018-04-08: 1 g via INTRAVENOUS
  Filled 2018-04-08: qty 100

## 2018-04-08 MED ORDER — INSULIN GLARGINE 100 UNIT/ML ~~LOC~~ SOLN
12.0000 [IU] | Freq: Every day | SUBCUTANEOUS | Status: DC
  Filled 2018-04-08: qty 0.12

## 2018-04-08 NOTE — Progress Notes (Signed)
Pharmacy Antibiotic Note  Alexander Alexander is a 47 y.o. male admitted on 04/07/2018 s/p cardiac arrest started on antibiotics for possible aspiration pneumonia.  Pharmacy has been consulted for Zosyn dosing.  Plan: Zosyn EI 3.375g IV Q8hr. Procalcitonin elevated at 0.62>>1.10. Will continue to trend procalcitonins.   Height: 5\' 10"  (177.8 cm)(based on previous notes) Weight: (!) 369 lb 14.9 oz (167.8 kg) IBW/kg (Calculated) : 73  Temp (24hrs), Avg:99.3 F (37.4 C), Min:97.7 F (36.5 C), Max:101.3 F (38.5 C)  Recent Labs  Lab 04/07/18 0546  04/07/18 1219 04/07/18 1233 04/07/18 1546 04/07/18 1911 04/07/18 2218 04/08/18 0427 04/08/18 0544  WBC 9.4  --   --   --   --   --   --   --   --   CREATININE 2.50*   < > 2.71*  --   --  2.44* 2.48* 2.39* 2.31*  LATICACIDVEN  --   --   --  2.4* 2.9*  --   --   --   --    < > = values in this interval not displayed.    Estimated Creatinine Clearance: 61.3 mL/min (A) (by C-G formula based on SCr of 2.31 mg/dL (H)).    Allergies  Allergen Reactions  . Lexapro [Escitalopram Oxalate] Swelling    Antimicrobials this admission: Zosyn 5/31 >>   Dose adjustments this admission: N/A  Microbiology results: 5/31 MRSA PCR: neg  Thank you for allowing pharmacy to be a part of this patient's care.  Melany Wiesman A 04/08/2018 9:08 AM

## 2018-04-08 NOTE — Progress Notes (Signed)
Name: Alexander Alexander MRN: [id number] DOB: Mar 13, 1971     CONSULTATION DATE: 04/07/2018  Subjective & objective: Patient remains on the vent sedated with propofol and fentanyl.  On Levophed, amiodarone, heparin drip and insulin drip.  PAST MEDICAL HISTORY :   has a past medical history of Allergy, Anxiety, Arrhythmia, Arthritis, Atrial fibrillation (HCC), CHF (congestive heart failure) (HCC), CKD (chronic kidney disease), Diabetes mellitus without complication (HCC), Diabetes mellitus, type II (HCC), Heart failure (HCC), Hyperlipidemia, Hypertension, Pancreatitis, and Sleep apnea.  has a past surgical history that includes Ablation (08/01/2017) and Mechanical aortic valve replacement. Prior to Admission medications   Medication Sig Start Date End Date Taking? Authorizing Provider  allopurinol (ZYLOPRIM) 100 MG tablet TAKE 0.5 TABLETS (50 MG TOTAL) BY MOUTH DAILY. 03/10/18  Yes Bacigalupo, Marzella Schlein, MD  ALPRAZolam Prudy Feeler) 0.5 MG tablet Take 1 tablet (0.5 mg total) by mouth 2 (two) times daily. 01/16/18  Yes Bacigalupo, Marzella Schlein, MD  colchicine 0.6 MG tablet TAKE 1 TABLET BY MOUTH EVERY DAY 04/04/18  Yes Bacigalupo, Marzella Schlein, MD  fluvoxaMINE (LUVOX) 25 MG tablet Take 1 tablet (25 mg total) by mouth at bedtime. 04/05/18  Yes Jomarie Longs, MD  gabapentin (NEURONTIN) 600 MG tablet Take 1 tablet (600 mg total) by mouth 2 (two) times daily. 01/19/18  Yes Bacigalupo, Marzella Schlein, MD  glimepiride (AMARYL) 2 MG tablet Take 1 tablet (2 mg total) by mouth daily with breakfast. 12/09/17  Yes Bacigalupo, Marzella Schlein, MD  insulin detemir (LEVEMIR) 100 UNIT/ML injection Inject 0.65 mLs (65 Units total) into the skin 2 (two) times daily. 01/16/18  Yes Bacigalupo, Marzella Schlein, MD  losartan (COZAAR) 100 MG tablet Take 1 tablet (100 mg total) by mouth daily. 01/16/18  Yes Bacigalupo, Marzella Schlein, MD  metolazone (ZAROXOLYN) 2.5 MG tablet Take 1 tablet (2.5 mg total) by mouth 2 (two) times daily. 03/29/18  Yes Bacigalupo, Marzella Schlein,  MD  metoprolol (TOPROL-XL) 200 MG 24 hr tablet Take 200 mg by mouth daily.  12/22/17  Yes [provider]  NOVOLOG 100 UNIT/ML injection Inject 35 Units into the skin 3 (three) times daily with meals. Patient taking differently: Inject 40 Units into the skin 3 (three) times daily with meals.  12/26/17  Yes Bacigalupo, Marzella Schlein, MD  torsemide (DEMADEX) 20 MG tablet Take 2 tablets (40 mg total) by mouth 2 (two) times daily. Patient taking differently: Take 20 mg by mouth 2 (two) times daily.  11/25/17 04/07/18 Yes Clarisa Kindred A, FNP  warfarin (COUMADIN) 5 MG tablet Take 5 mg by mouth daily at 6 PM.  04/05/18  Yes [provider]  acetaminophen (TYLENOL) 325 MG tablet Take 650 mg by mouth every 6 (six) hours as needed.    [provider]   Allergies  Allergen Reactions  . Lexapro [Escitalopram Oxalate] Swelling    FAMILY HISTORY:  family history includes Asthma in his father; Breast cancer in his maternal aunt; CAD in his mother; Dementia in his father; Depression in his brother and brother; Diabetes in his brother; Hypertension in his brother, brother, father, and mother; Pancreatic cancer in his paternal aunt. SOCIAL HISTORY:  reports that he quit smoking about 3 years ago. His smoking use included cigarettes. He has a 10.00 pack-year smoking history. He has never used smokeless tobacco. He reports that he has current or past drug history. Drug: Cocaine. He reports that he does not drink alcohol.  REVIEW OF SYSTEMS:   Unable to obtain due to critical illness  VITAL SIGNS: Temp:  [97.9 F (36.6 C)-101.3 F (38.5 C)] 101.3 F (38.5 C) (06/01 0730) Pulse Rate:  [76-111] 90 (06/01 1030) Resp:  [18-23] 18 (06/01 1030) BP: (60-153)/(39-112) 124/112 (06/01 1030) SpO2:  [93 %-100 %] 94 % (06/01 1030) FiO2 (%):  [65 %-70 %] 65 % (06/01 0730) Weight:  [167.8 kg (369 lb 14.9 oz)] 167.8 kg (369 lb 14.9 oz) (06/01 0429)  Physical Examination:   Sedated with propofol  and Fentanyl to RASS -4 On vent, no distress, bilateral equal air entry and no adventitious sounds S1 & S2 are audible, no murmur Obese benign abdomen with febrile peristalsis No peripheral edema  ASSESSMENT / PLAN: Acute respiratory failure with hypoxemia status post cardiac arrest. -Monitor ABG, optimize ventilator settings and continuous vent support.  Pulseless V. tach cardiac arrest. ECHO LVEF 55-60%. Cardiology arrest was secondary to respiratory issues no need to continue amiodarone and no further cardiac work-up is required at this point. -D/C Amiodarone, monitor electrolytes, CI, ECHO and follow with cardiology consult.  Altered mental status s/p cardiac arrest with being able to move extremities purposefully however not following commands when off sedation. Not candidate for hypothermia. -CT head to r/o intracranial abnormality. -Monitor neuro status.  Atelectasis and pneumonia with possible aspiration. Bil airspace disease -Empiric Zosyn, monitor CXR + CBC + FIO2.  CHF with questionable pulmonary congestion and cardiogenic shock s/p cardiac arrest. ECHO 11/2017 LVEF 45 % with diffuse hypokinesis. -Optimize pressers (c/w Levophed and taper off Dopamine) and monitor hemodynamics.   AKI on CKD (improved) -Optimize volume, avoid nephrotoxins, monitor renal panel and urine out put.  DKA (resolved) -Glycemic control.  Elevated LFT (improved) with possible acute ischemic hepatitis s/p cardiac arrest -Monitor LFT   Hemoconcentration with intravascular volume depletion -Optimize volume and monitor HB.  Coagulopathy with Warfarin and subtherapeutic INR.  S/p Mechanical aortic valve replacement. -Optimize anticoagulation, bridge with Heparin after obtaining CT head.  Hypophosphatemia -Replete and monitor electrolytes.  Full code  DVT & GI prophylaxis. Continue with supportive care  Critical care time 40 min

## 2018-04-08 NOTE — Progress Notes (Signed)
[facility] Encounter Note  Patient: Alexander Alexander / Admit Date: 04/07/2018 / Date of Encounter: 04/08/2018, 9:13 AM   Subjective: Patient is hemodynamically stable with current intravenous medication management.  Respiratory status with intubation and oxygenation stable.  Telemetry shows atrial fibrillation with variable heart rate.  Troponin level essentially unchanged and normal without evidence of myocardial infarction.  No signs of heart failure. Echocardiogram shows normal LV systolic function with ejection fraction of 60% with moderate left ventricular hypertrophy and normally functioning mechanical aortic valve Review of Systems: Cannot assess due to intubation and sedation  objective: Telemetry: Atrial fibrillation with controlled ventricular rate Physical Exam: Blood pressure 128/86, pulse 93, temperature (!) 101.3 F (38.5 C), temperature source Axillary, resp. rate 18, height _0  (1.778 m), weight (!) 369 lb 14.9 oz (167.8 kg), SpO2 95 %. Body mass index is 53.08 kg/m. General: Well developed, well nourished,   Head: Normocephalic, atraumatic, sclera non-icteric, no xanthomas, nares are without discharge. Neck: No apparent masses Lungs: Normal respirations with few wheezes, no rhonchi, no rales , no crackles   Heart: Irregular rate and rhythm, normal S1 mechanical S2, no murmur, no rub, no gallop, PMI is normal size and placement, carotid upstroke normal without bruit, jugular venous pressure normal Abdomen: Soft,  , non-distended with normoactive bowel sounds. No hepatosplenomegaly. Abdominal aorta is normal size without bruit Extremities: Trace edema, no clubbing, no cyanosis, no ulcers,  Peripheral: 2+ radial, 2+ femoral, 2+ dorsal pedal pulses Neuro: Not alert and oriented. Moves all extremities spontaneously. Psych: Does not responds to questions appropriately with a normal affect.   Intake/Output Summary (Last 24 hours) at 04/08/2018 0913 Last  data filed at 04/08/2018 0730 Gross per 24 hour  Intake 2868.61 ml  Output 7215 ml  Net -4346.39 ml    Inpatient Medications:  . chlorhexidine gluconate (MEDLINE KIT)  15 mL Mouth Rinse BID  . famotidine  20 mg Per Tube BID  . fentaNYL (SUBLIMAZE) injection  50 mcg Intravenous Once  . mouth rinse  15 mL Mouth Rinse 10 times per day   Infusions:  . sodium chloride    . 0.9 % NaCl with KCl 20 mEq / L Stopped (04/08/18 0300)  . amiodarone 30 mg/hr (04/07/18 1800)  . dextrose 5 % and 0.45% NaCl 100 mL/hr at 04/08/18 0300  . DOPamine Stopped (04/07/18 0930)  . fentaNYL infusion INTRAVENOUS 100 mcg/hr (04/07/18 1800)  . heparin 1,600 Units/hr (04/08/18 0615)  . insulin (NOVOLIN-R) infusion 25.9 Units/hr (04/08/18 7867)  . norepinephrine (LEVOPHED) Adult infusion 18 mcg/min (04/08/18 0738)  . piperacillin-tazobactam (ZOSYN)  IV 3.375 g (04/08/18 6720)  . potassium PHOSPHATE IVPB (mmol)    . propofol (DIPRIVAN) infusion 50 mcg/kg/min (04/08/18 0548)    Labs: Recent Labs    04/07/18 1219  04/08/18 0427 04/08/18 0544  NA 133*   < > 141 140  K 4.6   < > 3.4* 3.7  CL 89*   < > 98* 98*  CO2 30   < > 31 29  GLUCOSE 475*   < > 214* 209*  BUN 67*   < > 45* 43*  CREATININE 2.71*   < > 2.39* 2.31*  CALCIUM 8.1*   < > 8.3* 8.2*  MG 1.9  --   --  1.8  PHOS 3.3  --   --  2.1*   < > = values in this interval not displayed.   Recent Labs    04/07/18 0546 04/08/18 0544  AST  67* 41  ALT 42 34  ALKPHOS 84 49  BILITOT 1.0 1.7*  PROT 8.6* 7.5  ALBUMIN 4.1 3.5   Recent Labs    04/07/18 0546  WBC 9.4  HGB 18.2*  HCT 54.7*  MCV 86.4  PLT 231   Recent Labs    04/07/18 1546 04/07/18 1911 04/07/18 2218 04/08/18 0427  TROPONINI 0.16* 0.17* 0.17* 0.16*   Invalid input(s): POCBNP No results for input(s): HGBA1C in the last 72 hours.   Weights: Filed Weights   04/07/18 0222 04/08/18 0429  Weight: (!) 370 lb (167.8 kg) (!) 369 lb 14.9 oz (167.8 kg)     Radiology/Studies:   Dg Chest 1 View  Result Date: 04/07/2018 CLINICAL DATA:  47 y/o  M; intubated. EXAM: CHEST  1 VIEW COMPARISON:  12/06/2017 chest radiograph FINDINGS: Stable cardiomegaly given projection and technique. Transcutaneous pacing pads. Post median sternotomy with wires in alignment. Low lung volumes. Pulmonary vascular congestion. No focal consolidation. Endotracheal tube tip 2.4 cm above the carina. Enteric tube tip appears to project over the lower esophagus, advancement recommended. IMPRESSION: 1. Endotracheal tube tip 2.4 cm above the carina. 2. Cardiomegaly and pulmonary vascular congestion. 3. Enteric tube tip appears to project over the lower esophagus, advancement recommended. Electronically Signed   By: Kristine Garbe M.D.   On: 04/07/2018 06:22   Ct Head Wo Contrast  Result Date: 04/07/2018 CLINICAL DATA:  Cardiac arrest EXAM: CT HEAD WITHOUT CONTRAST TECHNIQUE: Contiguous axial images were obtained from the base of the skull through the vertex without intravenous contrast. COMPARISON:  None. FINDINGS: Brain: No evidence of acute infarction, hemorrhage, hydrocephalus, extra-axial collection or mass lesion/mass effect. Vascular: No hyperdense vessel or unexpected calcification. Skull: Negative Sinuses/Orbits: Negative Other: None IMPRESSION: Negative CT head Electronically Signed   By: Franchot Gallo M.D.   On: 04/07/2018 15:25   Dg Chest Port 1 View  Result Date: 04/08/2018 CLINICAL DATA:  Followup pneumonia. EXAM: PORTABLE CHEST 1 VIEW COMPARISON:  04/07/2018 and older exams. FINDINGS: Lung volumes are low. This accentuates the appearance of vascular congestion and interstitial prominence. There is mild linear atelectasis in the right mid lung, stable from the prior exam. Bilateral lung base opacity is most likely atelectasis. Pneumonia is possible. No overt pulmonary edema. Is Stable changes from prior cardiac surgery. Cardiac silhouette is mildly enlarged. Endotracheal tube, right  internal jugular central venous line and nasal/orogastric tube are stable. IMPRESSION: 1. No significant change from the previous day's study. 2. Vascular congestion with interstitial prominence, but without overt pulmonary edema. 3. Basilar atelectasis.  Cannot exclude pneumonia. Electronically Signed   By: Lajean Manes M.D.   On: 04/08/2018 07:23   Dg Chest Portable 1 View  Result Date: 04/07/2018 CLINICAL DATA:  Central catheter placement.  Hypoxia. EXAM: PORTABLE CHEST 1 VIEW COMPARISON:  Apr 07, 2018 study obtained earlier in the day FINDINGS: Right jugular catheter tip is in the superior vena cava near the cavoatrial junction. Endotracheal tube tip is 4.0 cm above the carina. Nasogastric tube tip and side port are below the diaphragm. No pneumothorax. There is bibasilar atelectasis. Lungs elsewhere are clear. 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 No adenopathy. No bone lesions. Patient is status post median sternotomy with aortic valve replacement. IMPRESSION: Tube and catheter positions as described without pneumothorax. There is pulmonary vascular congestion. Bibasilar atelectasis present. No consolidation evident. Electronically Signed   By: Lowella Grip III M.D.   On: 04/07/2018 08:51   Dg Abd Portable 1v  Result  Date: 04/07/2018 CLINICAL DATA:  NG placement EXAM: PORTABLE ABDOMEN - 1 VIEW COMPARISON:  None. FINDINGS: Limited exam due to large patient size and difficulty with positioning. NG tip in the proximal stomach. NG side hole in the distal esophagus. No dilated bowel loops. IMPRESSION: NG tube in the proximal stomach with the side hole in the distal esophagus. Recommend advancing at least 5 cm. Electronically Signed   By: Franchot Gallo M.D.   On: 04/07/2018 10:48     Assessment and Recommendation  47 y.o. male with known aortic valve stenosis status post remote mechanical valve replacement with left ventricular hypertrophy essential hypertension mixed  hyperlipidemia chronic kidney disease sleep apnea having an episode of pulseless electrical activity after back pain possibly consistent with a respiratory event rather than a cardiac event without evidence of myocardial infarction heart failure 1.  Continue supportive care from the respiratory standpoint and sleep apnea standpoint with possible aspiration 2.  Continue anticoagulation for further risk reduction in stroke with atrial fibrillation and mechanical aortic valve 3.  Consider discontinuation of amiodarone due to likely inability to convert patient to normal sinus rhythm and no apparent need for treatment of wide-complex tachycardia at this time 4.  No further cardiac diagnostics necessary at this time Signed, Serafina Royals M.D. FACC

## 2018-04-08 NOTE — Progress Notes (Addendum)
[place] for heparin drip management/warfarin management  Indication: atrial fibrillation and aortic valve replacement   Allergies  Allergen Reactions  . Lexapro [Escitalopram Oxalate] Swelling    Patient Measurements: Height: 5' 10"  (177.8 cm)(based on previous notes) Weight: (!) 369 lb 14.9 oz (167.8 kg) IBW/kg (Calculated) : 73 Heparin Dosing Weight: 111kg  Vital Signs: Temp: 101.3 F (38.5 C) (06/01 0730) Temp Source: Axillary (06/01 0730) BP: 128/86 (06/01 0730) Pulse Rate: 93 (06/01 0730)  Labs: Recent Labs    04/07/18 0546  04/07/18 1911 04/07/18 2218 04/07/18 2232 04/08/18 0427 04/08/18 0544  HGB 18.2*  --   --   --   --   --   --   HCT 54.7*  --   --   --   --   --   --   PLT 231  --   --   --   --   --   --   LABPROT 19.8*  --   --   --   --   --  20.5*  INR 1.70  --   --   --   --   --  1.77  HEPARINUNFRC  --   --   --   --  0.33  --  0.36  CREATININE 2.50*   < > 2.44* 2.48*  --  2.39* 2.31*  TROPONINI 0.03*   < > 0.17* 0.17*  --  0.16*  --    < > = values in this interval not displayed.    Estimated Creatinine Clearance: 61.3 mL/min (A) (by C-G formula based on SCr of 2.31 mg/dL (H)).   Medical History: Past Medical History:  Diagnosis Date  . Allergy   . Anxiety   . Arrhythmia   . Arthritis   . Atrial fibrillation ([place])   . CHF (congestive heart failure) ([place])   . CKD (chronic kidney disease)   . Diabetes mellitus without complication ([place])   . Diabetes mellitus, type II ([place])   . Heart failure ([place])   . Hyperlipidemia   . Hypertension   . Pancreatitis   . Sleep apnea     Medications:  Scheduled:  . chlorhexidine gluconate (MEDLINE KIT)  15 mL Mouth Rinse BID  . famotidine  20 mg Per Tube BID  . fentaNYL (SUBLIMAZE) injection  50 mcg Intravenous Once  . mouth rinse  15 mL Mouth Rinse 10 times per day   Infusions:  . sodium chloride    . 0.9 % NaCl with KCl 20 mEq / L Stopped (04/08/18 0300)   . amiodarone 30 mg/hr (04/07/18 1800)  . dextrose 5 % and 0.45% NaCl 100 mL/hr at 04/08/18 0300  . DOPamine Stopped (04/07/18 0930)  . fentaNYL infusion INTRAVENOUS 100 mcg/hr (04/07/18 1800)  . heparin 1,600 Units/hr (04/08/18 0615)  . insulin (NOVOLIN-R) infusion 25.9 Units/hr (04/08/18 5916)  . norepinephrine (LEVOPHED) Adult infusion 18 mcg/min (04/08/18 0738)  . piperacillin-tazobactam (ZOSYN)  IV 3.375 g (04/08/18 3846)  . potassium PHOSPHATE IVPB (mmol)    . propofol (DIPRIVAN) infusion 50 mcg/kg/min (04/08/18 0548)    Assessment: Pharmacy consulted for warfarin and heparin drip management for 47 yo male admitted to the ICU s/p cardiac arrest. Patient has history significant for aortic valve replacement, atrial fibrillation and CHF. Patient currently requiring amiodarone infusion and blood pressure support from norepinephrine. Head CT is negative for infarction or hemorrhage.   Patient takes warfarin 49m daily as an outpatient for goal INR  2.5-3.5.   Heparin infusing at 1600 units/hr.   Goal of Therapy:  INR 2-3 Heparin level 0.3-0.7 units/ml Monitor platelets by anticoagulation protocol: Yes   Plan:  Heparin:Hepain level is in range at 0.36, will continue infusion at 1600 units/hr. Will obtain next heparin level with am labs   Warfarin: plan is to hold warfarin until patient is more stable. Will tentatively plan to resume warfarin on 6/2. INR was subtherapeutic on admission at 1.7.  6/1  INR  1.77  Will obtain INR with am labs.   Pharmacy will continue to monitor and adjust per consult.   Ladislao Cohenour A 04/08/2018,9:00 AM

## 2018-04-08 NOTE — Progress Notes (Signed)
Pharmacy Electrolyte Monitoring Consult:  Pharmacy consulted to assist in monitoring and replacing electrolytes in this 47 y.o. male admitted on 04/07/2018 s/p cardiac arrest and requiring mechanical ventilation. Patient on insulin infusion for DKA.    Patient currently ordered NS@ 15400mL/hr.   Labs:  Sodium (mmol/L)  Date Value  04/08/2018 138  03/29/2018 142   Potassium (mmol/L)  Date Value  04/08/2018 3.1 (L)   Magnesium (mg/dL)  Date Value  96/04/540906/11/2017 1.8   Phosphorus (mg/dL)  Date Value  81/19/147806/11/2017 2.1 (L)   Calcium (mg/dL)  Date Value  29/56/213006/11/2017 7.5 (L)   Albumin (g/dL)  Date Value  86/57/846906/11/2017 3.5  12/09/2017 4.2    Assessment/Plan: K @ 1800 was 3.1. Will give KCL po 40 MEQ packet per tube x 2 doses. Pt w/ hx of afib and on insulin drip. Target goal of >4.  Will continue Q4hr BMPs while being treated for DKA. Continue to follow DKA protocol.   Pharmacy will continue to monitor and adjust per consult.   Olene FlossMelissa D Miliana Gangwer, PharmD, BCPS Clinical Pharmacist 04/08/2018 8:08 PM

## 2018-04-08 NOTE — Progress Notes (Signed)
PHARMACIST - PHYSICIAN COMMUNICATION  CONCERNING: IV to Oral Route Change Policy  RECOMMENDATION: This patient is receiving  Famotidine by the intravenous route.  Based on criteria approved by the Pharmacy and Therapeutics Committee, the intravenous medication(s) is/are being converted to the equivalent oral dose form(s).   DESCRIPTION: These criteria include:  The patient is eating (either orally or via tube) and/or has been taking other orally administered medications for a least 24 hours  The patient has no evidence of active gastrointestinal bleeding or impaired GI absorption (gastrectomy, short bowel, patient on TNA or NPO).  If you have questions about this conversion, please contact the Pharmacy Department  []   [phone]( [phone] )  [facility] [x]   [phone]( [phone] )  [facility] []   [phone]( [phone]  [facility] []   [phone]( [phone] )  [facility] []   [phone]( [phone] )  [facility]   Simpson,Michael L, [facility] CenterRPH 04/08/2018 7:56 AM

## 2018-04-08 NOTE — Progress Notes (Signed)
[facility] [place] [facility]   PATIENT NAME: Alexander Alexander    MR#:  [id number]  DATE OF BIRTH:  December 17, 1970  SUBJECTIVE:   Patient intubated and on the ventilator. Family in the room.  REVIEW OF SYSTEMS:   Review of Systems  Unable to perform ROS: Intubated   Tolerating Diet:Tube feeding Tolerating PT: intubated  DRUG ALLERGIES:   Allergies  Allergen Reactions  . Lexapro [Escitalopram Oxalate] Swelling    VITALS:  Blood pressure (!) 124/112, pulse 90, temperature (!) 101.3 F (38.5 C), temperature source Axillary, resp. rate 18, height 5\' 10"  (1.778 m), weight (!) 167.8 kg (369 lb 14.9 oz), SpO2 94 %.  PHYSICAL EXAMINATION:   Physical Exam  GENERAL:  47 y.o.-year-old patient lying in the bed with no acute distress. Morbidly obese EYES: Pupils equal, round, reactive to light and accommodation. No scleral icterus.  HEENT: Head atraumatic, normocephalic. Oropharynx and nasopharynx clear. Intubated and on the vent NECK:  Supple, no jugular venous distention. No thyroid enlargement, no tenderness.  LUNGS: Normal breath sounds bilaterally, no wheezing, rales, rhonchi. No use of accessory muscles of respiration.  CARDIOVASCULAR: S1, S2 normal. No murmurs, rubs, or gallops.  ABDOMEN: Soft, nontender, nondistended. Bowel sounds present. No organomegaly or mass.  EXTREMITIES: No cyanosis, clubbing or edema b/l.    NEUROLOGIC: unable to assess --pt intubated PSYCHIATRIC: intubated  SKIN: No obvious rash, lesion, or ulcer.   LABORATORY PANEL:  CBC Recent Labs  Lab 04/07/18 0546  WBC 9.4  HGB 18.2*  HCT 54.7*  PLT 231    Chemistries  Recent Labs  Lab 04/08/18 0544 04/08/18 1053  NA 140 140  K 3.7 3.5  CL 98* 101  CO2 29 30  GLUCOSE 209* 195*  BUN 43* 38*  CREATININE 2.31* 2.34*  CALCIUM 8.2* 7.7*  MG 1.8  --   AST 41  --   ALT 34  --   ALKPHOS 49  --   BILITOT 1.7*  --    Cardiac Enzymes Recent Labs  Lab 04/08/18 0427   TROPONINI 0.16*   RADIOLOGY:  Dg Chest 1 View  Result Date: 04/07/2018 CLINICAL DATA:  47 y/o  M; intubated. EXAM: CHEST  1 VIEW COMPARISON:  12/06/2017 chest radiograph FINDINGS: Stable cardiomegaly given projection and technique. Transcutaneous pacing pads. Post median sternotomy with wires in alignment. Low lung volumes. Pulmonary vascular congestion. No focal consolidation. Endotracheal tube tip 2.4 cm above the carina. Enteric tube tip appears to project over the lower esophagus, advancement recommended. IMPRESSION: 1. Endotracheal tube tip 2.4 cm above the carina. 2. Cardiomegaly and pulmonary vascular congestion. 3. Enteric tube tip appears to project over the lower esophagus, advancement recommended. Electronically Signed   By: Mitzi Hansen M.D.   On: 04/07/2018 06:22   Ct Head Wo Contrast  Result Date: 04/07/2018 CLINICAL DATA:  Cardiac arrest EXAM: CT HEAD WITHOUT CONTRAST TECHNIQUE: Contiguous axial images were obtained from the base of the skull through the vertex without intravenous contrast. COMPARISON:  None. FINDINGS: Brain: No evidence of acute infarction, hemorrhage, hydrocephalus, extra-axial collection or mass lesion/mass effect. Vascular: No hyperdense vessel or unexpected calcification. Skull: Negative Sinuses/Orbits: Negative Other: None IMPRESSION: Negative CT head Electronically Signed   By: Marlan Palau M.D.   On: 04/07/2018 15:25   Dg Chest Port 1 View  Result Date: 04/08/2018 CLINICAL DATA:  Followup pneumonia. EXAM: PORTABLE CHEST 1 VIEW COMPARISON:  04/07/2018 and older exams. FINDINGS: Lung volumes are low. This accentuates the appearance  of vascular congestion and interstitial prominence. There is mild linear atelectasis in the right mid lung, stable from the prior exam. Bilateral lung base opacity is most likely atelectasis. Pneumonia is possible. No overt pulmonary edema. Is Stable changes from prior cardiac surgery. Cardiac silhouette is mildly enlarged.  Endotracheal tube, right internal jugular central venous line and nasal/orogastric tube are stable. IMPRESSION: 1. No significant change from the previous day's study. 2. Vascular congestion with interstitial prominence, but without overt pulmonary edema. 3. Basilar atelectasis.  Cannot exclude pneumonia. Electronically Signed   By: Amie Portlandavid  Ormond M.D.   On: 04/08/2018 07:23   Dg Chest Portable 1 View  Result Date: 04/07/2018 CLINICAL DATA:  Central catheter placement.  Hypoxia. EXAM: PORTABLE CHEST 1 VIEW COMPARISON:  Apr 07, 2018 study obtained earlier in the day FINDINGS: Right jugular catheter tip is in the superior vena cava near the cavoatrial junction. Endotracheal tube tip is 4.0 cm above the carina. Nasogastric tube tip and side port are below the diaphragm. No pneumothorax. There is bibasilar atelectasis. Lungs elsewhere are clear. 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 No adenopathy. No bone lesions. Patient is status post median sternotomy with aortic valve replacement. IMPRESSION: Tube and catheter positions as described without pneumothorax. There is pulmonary vascular congestion. Bibasilar atelectasis present. No consolidation evident. Electronically Signed   By: Bretta BangWilliam  Woodruff III M.D.   On: 04/07/2018 08:51   Dg Abd Portable 1v  Result Date: 04/07/2018 CLINICAL DATA:  NG placement EXAM: PORTABLE ABDOMEN - 1 VIEW COMPARISON:  None. FINDINGS: Limited exam due to large patient size and difficulty with positioning. NG tip in the proximal stomach. NG side hole in the distal esophagus. No dilated bowel loops. IMPRESSION: NG tube in the proximal stomach with the side hole in the distal esophagus. Recommend advancing at least 5 cm. Electronically Signed   By: Marlan Palauharles  Clark M.D.   On: 04/07/2018 10:48   ASSESSMENT AND PLAN:  Alexander Alexander  is a 47 y.o. male who presents with complaint of low back pain.  Patient is a known history of sciatica, medicated for similar complaint of  low back pain with radiation down his legs.  Initially he told ED physician and the pain was much worse than it has been for him, and so MRI was ordered.  While patient was in radiology department waiting to start MRI he had a cardiac arrest.   *Cardiac arrest [facility](HCC) -unclear etiology for his cardiac arrest in terms of acute pathology.  - This seems likely to have been a spontaneous arrest, perhaps due to underlying arrhythmias in the setting of significant underlying chronic disease.  - He was intubated during his code event, and is being sent to the ICU for further treatment.   -He has been placed on antiarrhythmics, and cardiology will be consulted.  *  Chronic combined systolic (congestive) and diastolic (congestive) heart failure (HCC) -also potentially causing some arrhythmias, including his ventricular rhythm which may have been responsible for his cardiac arrest.  He had significant runs of V. tach in the ED postarrest.  -Patient is off IV amiodarone.  *HTN (hypertension) -hold home dose antihypertensives for now as the antiarrhythmics and his code event have dropped his blood pressure.  Patient is currently on IV pressors  *  T2DM (type 2 diabetes mellitus) (HCC) -sliding scale insulin with corresponding glucose checks  *  Atrial fibrillation (HCC) -continue rate controlling medications as well as anticoagulation with IV heparin  *  Aortic valve replaced -continue  anticoagulation  *  OSA (obstructive sleep apnea) -currently intubated, once he is extubated he will need CPAP nightly  *  CKD (chronic kidney disease) stage 3, GFR 30-59 ml/min (HCC) -at baseline, monitor and avoid nephrotoxins -consider nephrology consult if needed  *  Hyperlipidemia associated with type 2 diabetes mellitus (HCC) -continue home dose antilipid  Spoke with mom and wife  Case discussed with Care Management/Social Worker. Management plans discussed with the patient, family and they are in  agreement.  CODE STATUS: full  DVT Prophylaxis: heparin gtt  TOTAL TIME TAKING CARE OF THIS PATIENT: *30* minutes.  >50% time spent on counselling and coordination of care  POSSIBLE D/C IN few DAYS, DEPENDING ON CLINICAL CONDITION.  Note: This dictation was prepared with Dragon dictation along with smaller phrase technology. Any transcriptional errors that result from this process are unintentional.  Enedina Finner M.D on 04/08/2018 at 12:52 PM  Between 7am to 6pm - Pager - [phone]  After 6pm go to [email or web address] - Social research officer, government  [facility]  Office  [phone]  CC: Primary care physician; Erasmo Downer, MDPatient ID: [name], male   DOB: 1971-10-08, 47 y.o.   MRN: [id number]

## 2018-04-08 NOTE — Progress Notes (Signed)
Pharmacy Electrolyte Monitoring Consult:  Pharmacy consulted to assist in monitoring and replacing electrolytes in this 47 y.o. male admitted on 04/07/2018 s/p cardiac arrest and requiring mechanical ventilation. Patient on insulin infusion for DKA.    Patient currently ordered NS@ 17200mL/hr.   Labs:  Sodium (mmol/L)  Date Value  04/08/2018 140  03/29/2018 142   Potassium (mmol/L)  Date Value  04/08/2018 3.7   Magnesium (mg/dL)  Date Value  16/10/960406/11/2017 1.8   Phosphorus (mg/dL)  Date Value  54/09/811906/11/2017 2.1 (L)   Calcium (mg/dL)  Date Value  14/78/295606/11/2017 8.2 (L)   Albumin (g/dL)  Date Value  21/30/865706/11/2017 3.5  12/09/2017 4.2    Assessment/Plan: 6/1 AM Phos:2.1, Mg: 1.8, K: 3.7.   NS with potassium 7420mEq/L @100mL /hr running.   Will order KPhos 15mmol IV x 1 dose, Mg Sulfate IV 1gm x 1 dose.    Will continue Q4hr BMPs while being treated for DKA. Continue to follow DKA protocol.   Pharmacy will continue to monitor and adjust per consult.   Gardner CandleSheema M Akasia Ahmad, PharmD, BCPS Clinical Pharmacist 04/08/2018 7:23 AM

## 2018-04-08 NOTE — Progress Notes (Signed)
   04/08/18 0930  Clinical Encounter Type  Visited With Patient  Visit Type Follow-up  Referral From Nurse  Consult/Referral To Chaplain  Spiritual Encounters  Spiritual Needs Prayer   [facility] visited with PT while rounding on ICU. PT is unable to communicate due to sedation. [place] prayed silently for PT and departed RM. CH met PT's wife and brother in hallway and spoke about PT's current condition. [place] will follow up again later today.

## 2018-04-09 ENCOUNTER — Inpatient Hospital Stay: Payer: Medicare HMO

## 2018-04-09 ENCOUNTER — Other Ambulatory Visit: Payer: Self-pay

## 2018-04-09 LAB — PHOSPHORUS: PHOSPHORUS: 2.5 mg/dL (ref 2.5–4.6)

## 2018-04-09 LAB — CBC WITH DIFFERENTIAL/PLATELET
BASOS ABS: 0 10*3/uL (ref 0–0.1)
Basophils Relative: 1 %
EOS ABS: 0.1 10*3/uL (ref 0–0.7)
EOS PCT: 1 %
HCT: 48.7 % (ref 40.0–52.0)
Hemoglobin: 15.9 g/dL (ref 13.0–18.0)
Lymphocytes Relative: 32 %
Lymphs Abs: 3.1 10*3/uL (ref 1.0–3.6)
MCH: 28 pg (ref 26.0–34.0)
MCHC: 32.7 g/dL (ref 32.0–36.0)
MCV: 85.6 fL (ref 80.0–100.0)
Monocytes Absolute: 1.5 10*3/uL — ABNORMAL HIGH (ref 0.2–1.0)
Monocytes Relative: 16 %
Neutro Abs: 4.9 10*3/uL (ref 1.4–6.5)
Neutrophils Relative %: 50 %
PLATELETS: 196 10*3/uL (ref 150–440)
RBC: 5.68 MIL/uL (ref 4.40–5.90)
RDW: 18.2 % — ABNORMAL HIGH (ref 11.5–14.5)
WBC: 9.6 10*3/uL (ref 3.8–10.6)

## 2018-04-09 LAB — BLOOD GAS, ARTERIAL
ACID-BASE EXCESS: 2.5 mmol/L — AB (ref 0.0–2.0)
Bicarbonate: 29.4 mmol/L — ABNORMAL HIGH (ref 20.0–28.0)
FIO2: 0.65
MECHVT: 550 mL
O2 Saturation: 92.8 %
PATIENT TEMPERATURE: 37
PEEP: 5 cmH2O
PH ART: 7.36 (ref 7.350–7.450)
PO2 ART: 69 mmHg — AB (ref 83.0–108.0)
RATE: 18 resp/min
pCO2 arterial: 52 mmHg — ABNORMAL HIGH (ref 32.0–48.0)

## 2018-04-09 LAB — BASIC METABOLIC PANEL
Anion gap: 10 (ref 5–15)
BUN: 36 mg/dL — AB (ref 6–20)
CALCIUM: 7.7 mg/dL — AB (ref 8.9–10.3)
CO2: 28 mmol/L (ref 22–32)
CREATININE: 2.81 mg/dL — AB (ref 0.61–1.24)
Chloride: 98 mmol/L — ABNORMAL LOW (ref 101–111)
GFR calc Af Amer: 29 mL/min — ABNORMAL LOW (ref >60)
GFR, EST NON AFRICAN AMERICAN: 25 mL/min — AB (ref >60)
Glucose, Bld: 315 mg/dL — ABNORMAL HIGH (ref 65–99)
Potassium: 6 mmol/L — ABNORMAL HIGH (ref 3.5–5.1)
SODIUM: 136 mmol/L (ref 135–145)

## 2018-04-09 LAB — GLUCOSE, CAPILLARY
GLUCOSE-CAPILLARY: 328 mg/dL — AB (ref 65–99)
GLUCOSE-CAPILLARY: 299 mg/dL — AB (ref 65–99)
GLUCOSE-CAPILLARY: 279 mg/dL — AB (ref 65–99)
GLUCOSE-CAPILLARY: 260 mg/dL — AB (ref 65–99)
GLUCOSE-CAPILLARY: 232 mg/dL — AB (ref 65–99)
GLUCOSE-CAPILLARY: 189 mg/dL — AB (ref 65–99)
GLUCOSE-CAPILLARY: 184 mg/dL — AB (ref 65–99)
GLUCOSE-CAPILLARY: 202 mg/dL — AB (ref 65–99)
GLUCOSE-CAPILLARY: 218 mg/dL — AB (ref 65–99)
Glucose-Capillary: 210 mg/dL — ABNORMAL HIGH (ref 65–99)
Glucose-Capillary: 223 mg/dL — ABNORMAL HIGH (ref 65–99)
Glucose-Capillary: 241 mg/dL — ABNORMAL HIGH (ref 65–99)
Glucose-Capillary: 247 mg/dL — ABNORMAL HIGH (ref 65–99)
Glucose-Capillary: 253 mg/dL — ABNORMAL HIGH (ref 65–99)
Glucose-Capillary: 271 mg/dL — ABNORMAL HIGH (ref 65–99)
Glucose-Capillary: 313 mg/dL — ABNORMAL HIGH (ref 65–99)
Glucose-Capillary: 342 mg/dL — ABNORMAL HIGH (ref 65–99)
Glucose-Capillary: 351 mg/dL — ABNORMAL HIGH (ref 65–99)
Glucose-Capillary: 362 mg/dL — ABNORMAL HIGH (ref 65–99)
Glucose-Capillary: 362 mg/dL — ABNORMAL HIGH (ref 65–99)

## 2018-04-09 LAB — URINE CULTURE
Culture: NO GROWTH
Special Requests: NORMAL

## 2018-04-09 LAB — POTASSIUM
POTASSIUM: 4.3 mmol/L (ref 3.5–5.1)
POTASSIUM: 4.4 mmol/L (ref 3.5–5.1)
POTASSIUM: 4 mmol/L (ref 3.5–5.1)
Potassium: 3.9 mmol/L (ref 3.5–5.1)

## 2018-04-09 LAB — MAGNESIUM: MAGNESIUM: 1.6 mg/dL — AB (ref 1.7–2.4)

## 2018-04-09 LAB — PROTIME-INR
INR: 1.6
PROTHROMBIN TIME: 18.9 s — AB (ref 11.4–15.2)

## 2018-04-09 LAB — HEPARIN LEVEL (UNFRACTIONATED)
Heparin Unfractionated: 0.23 IU/mL — ABNORMAL LOW (ref 0.30–0.70)
Heparin Unfractionated: 0.39 IU/mL (ref 0.30–0.70)
Heparin Unfractionated: 0.43 IU/mL (ref 0.30–0.70)

## 2018-04-09 LAB — PROCALCITONIN: Procalcitonin: 1.12 ng/mL

## 2018-04-09 MED ORDER — FUROSEMIDE 10 MG/ML IJ SOLN
20.0000 mg | Freq: Every day | INTRAMUSCULAR | Status: DC
  Administered 2018-04-10 – 2018-04-17 (×7): 20 mg via INTRAVENOUS
  Filled 2018-04-09 (×7): qty 2

## 2018-04-09 MED ORDER — SODIUM BICARBONATE 8.4 % IV SOLN
50.0000 meq | Freq: Once | INTRAVENOUS | Status: AC
  Administered 2018-04-09: 50 meq via INTRAVENOUS
  Filled 2018-04-09: qty 50

## 2018-04-09 MED ORDER — AMIODARONE HCL IN DEXTROSE 360-4.14 MG/200ML-% IV SOLN
60.0000 mg/h | INTRAVENOUS | Status: AC
  Administered 2018-04-09 (×2): 60 mg/h via INTRAVENOUS
  Filled 2018-04-09 (×2): qty 200

## 2018-04-09 MED ORDER — VITAL HIGH PROTEIN PO LIQD
1000.0000 mL | ORAL | Status: DC
  Administered 2018-04-10: 1000 mL

## 2018-04-09 MED ORDER — VANCOMYCIN HCL 10 G IV SOLR
1500.0000 mg | INTRAVENOUS | Status: DC
  Administered 2018-04-09: 1500 mg via INTRAVENOUS
  Filled 2018-04-09 (×2): qty 1500

## 2018-04-09 MED ORDER — PRO-STAT SUGAR FREE PO LIQD
30.0000 mL | Freq: Two times a day (BID) | ORAL | Status: DC
  Administered 2018-04-09 – 2018-04-10 (×4): 30 mL

## 2018-04-09 MED ORDER — VANCOMYCIN HCL IN DEXTROSE 1-5 GM/200ML-% IV SOLN
1000.0000 mg | Freq: Once | INTRAVENOUS | Status: AC
  Administered 2018-04-09: 1000 mg via INTRAVENOUS
  Filled 2018-04-09: qty 200

## 2018-04-09 MED ORDER — SODIUM CHLORIDE 0.9 % IV SOLN
750.0000 mg | Freq: Two times a day (BID) | INTRAVENOUS | Status: DC
  Administered 2018-04-09 – 2018-04-17 (×17): 750 mg via INTRAVENOUS
  Filled 2018-04-09 (×7): qty 7.5
  Filled 2018-04-09: qty 5
  Filled 2018-04-09 (×9): qty 7.5
  Filled 2018-04-09: qty 5

## 2018-04-09 MED ORDER — DEXTROSE 50 % IV SOLN
1.0000 | Freq: Once | INTRAVENOUS | Status: AC
  Administered 2018-04-09: 50 mL via INTRAVENOUS
  Filled 2018-04-09: qty 50

## 2018-04-09 MED ORDER — VITAL HIGH PROTEIN PO LIQD
1000.0000 mL | ORAL | Status: DC
  Administered 2018-04-09: 1000 mL

## 2018-04-09 MED ORDER — INSULIN ASPART 100 UNIT/ML IV SOLN
10.0000 [IU] | Freq: Once | INTRAVENOUS | Status: AC
  Administered 2018-04-09: 10 [IU] via INTRAVENOUS
  Filled 2018-04-09: qty 0.1

## 2018-04-09 MED ORDER — SODIUM CHLORIDE 0.9 % IV SOLN
INTRAVENOUS | Status: DC
  Administered 2018-04-09: 3 [IU]/h via INTRAVENOUS
  Administered 2018-04-09: 18.6 [IU]/h via INTRAVENOUS
  Administered 2018-04-09: 18 [IU]/h via INTRAVENOUS
  Administered 2018-04-10: 10.8 [IU]/h via INTRAVENOUS
  Administered 2018-04-10: 27.5 [IU]/h via INTRAVENOUS
  Administered 2018-04-10: 26.8 [IU]/h via INTRAVENOUS
  Administered 2018-04-10: 27 [IU]/h via INTRAVENOUS
  Administered 2018-04-11: 4.2 [IU]/h via INTRAVENOUS
  Filled 2018-04-09 (×7): qty 1

## 2018-04-09 MED ORDER — AMIODARONE HCL IN DEXTROSE 360-4.14 MG/200ML-% IV SOLN: 30.0000 mg/h | INTRAVENOUS | Status: DC

## 2018-04-09 MED ORDER — MAGNESIUM SULFATE 4 GM/100ML IV SOLN
4.0000 g | Freq: Once | INTRAVENOUS | Status: AC
  Administered 2018-04-09: 4 g via INTRAVENOUS
  Filled 2018-04-09: qty 100

## 2018-04-09 MED ORDER — SODIUM CHLORIDE 0.9 % IV SOLN
1.0000 g | Freq: Once | INTRAVENOUS | Status: AC
  Administered 2018-04-09: 1 g via INTRAVENOUS
  Filled 2018-04-09: qty 10

## 2018-04-09 NOTE — Progress Notes (Signed)
Name: Alexander Alexander MRN: [id number] DOB: June 24, 1971     CONSULTATION DATE: 04/07/2018  Subjective & objective: Patient remains on ventilator, heparin drip, insulin drip , amiodarone drip, Levophed, sedated with propofol, and responding well to diuresis.  FiO2 is down to 60%. Febrile Tmax 101.3  PAST MEDICAL HISTORY :   has a past medical history of Allergy, Anxiety, Arrhythmia, Arthritis, Atrial fibrillation (HCC), CHF (congestive heart failure) (HCC), CKD (chronic kidney disease), Diabetes mellitus without complication (HCC), Diabetes mellitus, type II (HCC), Heart failure (HCC), Hyperlipidemia, Hypertension, Pancreatitis, and Sleep apnea.  has a past surgical history that includes Ablation (08/01/2017) and Mechanical aortic valve replacement. Prior to Admission medications   Medication Sig Start Date End Date Taking? Authorizing Provider  allopurinol (ZYLOPRIM) 100 MG tablet TAKE 0.5 TABLETS (50 MG TOTAL) BY MOUTH DAILY. 03/10/18  Yes Bacigalupo, [name], MD  ALPRAZolam Prudy Feeler(XANAX) 0.5 MG tablet Take 1 tablet (0.5 mg total) by mouth 2 (two) times daily. 01/16/18  Yes Bacigalupo, [name], MD  colchicine 0.6 MG tablet TAKE 1 TABLET BY MOUTH EVERY DAY 04/04/18  Yes Bacigalupo, [name], MD  fluvoxaMINE (LUVOX) 25 MG tablet Take 1 tablet (25 mg total) by mouth at bedtime. 04/05/18  Yes Jomarie LongsEappen, Saramma, MD  gabapentin (NEURONTIN) 600 MG tablet Take 1 tablet (600 mg total) by mouth 2 (two) times daily. 01/19/18  Yes Bacigalupo, [name], MD  glimepiride (AMARYL) 2 MG tablet Take 1 tablet (2 mg total) by mouth daily with breakfast. 12/09/17  Yes Bacigalupo, [name], MD  insulin detemir (LEVEMIR) 100 UNIT/ML injection Inject 0.65 mLs (65 Units total) into the skin 2 (two) times daily. 01/16/18  Yes Bacigalupo, [name], MD  losartan (COZAAR) 100 MG tablet Take 1 tablet (100 mg total) by mouth daily. 01/16/18  Yes Bacigalupo, [name], MD  metolazone (ZAROXOLYN) 2.5 MG tablet Take 1 tablet (2.5 mg total) by  mouth 2 (two) times daily. 03/29/18  Yes Bacigalupo, [name], MD  metoprolol (TOPROL-XL) 200 MG 24 hr tablet Take 200 mg by mouth daily.  12/22/17  Yes [provider]  NOVOLOG 100 UNIT/ML injection Inject 35 Units into the skin 3 (three) times daily with meals. Patient taking differently: Inject 40 Units into the skin 3 (three) times daily with meals.  12/26/17  Yes Bacigalupo, [name], MD  torsemide (DEMADEX) 20 MG tablet Take 2 tablets (40 mg total) by mouth 2 (two) times daily. Patient taking differently: Take 20 mg by mouth 2 (two) times daily.  11/25/17 04/07/18 Yes Clarisa KindredHackney, Tina A, FNP  warfarin (COUMADIN) 5 MG tablet Take 5 mg by mouth daily at 6 PM.  04/05/18  Yes [provider]  acetaminophen (TYLENOL) 325 MG tablet Take 650 mg by mouth every 6 (six) hours as needed.    [provider]   Allergies  Allergen Reactions  . Lexapro [Escitalopram Oxalate] Swelling    FAMILY HISTORY:  family history includes Asthma in his father; Breast cancer in his maternal aunt; CAD in his mother; Dementia in his father; Depression in his brother and brother; Diabetes in his brother; Hypertension in his brother, brother, father, and mother; Pancreatic cancer in his paternal aunt. SOCIAL HISTORY:  reports that he quit smoking about 3 years ago. His smoking use included cigarettes. He has a 10.00 pack-year smoking history. He has never used smokeless tobacco. He reports that he has current or past drug history. Drug: Cocaine. He reports that he does not drink alcohol.  REVIEW OF SYSTEMS:  Unable to obtain due to critical illness   VITAL SIGNS: Temp:  [99.8 F (37.7 C)-101.3 F (38.5 C)] 100.1 F (37.8 C) (06/02 0800) Pulse Rate:  [74-114] 110 (06/02 1000) Resp:  [18-21] 20 (06/02 1000) BP: (78-134)/(37-114) 131/81 (06/02 1000) SpO2:  [92 %-96 %] 92 % (06/02 1000) FiO2 (%):  [60 %-70 %] 70 % (06/02 1000)  Physical Examination:  Awake off sedation respond to his name by  opening his eyes does not track does not follow commands moves all extremities.  Questionable partial seizure activity but left upper extremity and perioral muscles.  Wife at the bedside mentioned that sometimes he acts like that when she wakes him up early in the morning but she feels that he is not alert. On vent, no distress, bilateral equal air entry and no adventitious sounds S1 & S2 are audible, no murmur Obese benign abdomen with febrile peristalsis No peripheral edema  ASSESSMENT / PLAN:  Acute respiratory failurewith hypoxemia status post cardiac arrest. P/F 106 on 65%. -Monitor ABG, optimize ventilator settings and continuous vent support.  Pulseless V. tach cardiac arrest. ECHO LVEF 55-60%. Cardiology arrest was secondary to respiratory issues no need to continue amiodarone and no further cardiac work-up is required at this point. -D/C Amiodarone, monitor electrolytes, and follow with cardiology recommendations.  Altered mental statuss/p cardiac arrest with being able to move extremities purposefully however not following commands when off sedation, does not track, and new onset of questionable seizure activity the left upper extremity and perioral muscles.  Initial CT head to r/o intracranial abnormality. -Start on Keppra, stat CT head and follow with neurology consult -Monitor neuro status.  Atelectasis and pneumonia (febrile, worsening airspace disease). Worsening Bil airspace disease.  -c/w Zosyn start on Vanc, monitor CXR + CBC + FIO2.and cultures  Possible Sepsis with fever, hypotension with worsening pneumonia  -Vanc + Zosyn. Cultures remain -ve.  CHF withquestionablepulmonarycongestionand cardiogenic shock s/p cardiac arrest. ECHO 11/2017 LVEF 45 % with diffuse hypokinesis. -Optimize pressers (c/w Levophed and taper off Dopamine) and monitor hemodynamics.  AKI on CKD worsening with hyperkalemia -Optimize volume, avoid nephrotoxins, monitor renal panel and  urine out put.  DKA (resolved) -Glycemic control.  Elevated LFT (improved)with possible acute ischemic hepatitis s/p cardiac arrest -Monitor LFT   Hemoconcentration (improved) withintravascular volume depletion -Optimize volume and monitor HB.  Coagulopathy with Warfarin and subtherapeutic INR. S/p Mechanical aortic valve replacement. -Optimize anticoagulation, bridge with Heparin after obtaining CT head.  Full code  DVT & GI prophylaxis. Continue with supportive care Mrs Dalgleish was updated at the bedside and she agrees to the plan of care  Critical care time 40 min

## 2018-04-09 NOTE — Consult Note (Signed)
STROKE TEAM PROGRESS NOTE   HPI: 47 y.o. male with below list of chronic medical conditions including CHF CKD, CHF,known aortic valve stenosis status post remote mechanical valve replacement with left ventricular hypertrophy with subtherapeutic warfarin,  arrhythmias, HTN, OSA, HLD, DM2, diabetes and sciatica presents to the emergency department with progressively worsening low back pain with radiation down left leg x2 months with progressive worsening over the course of this week. Patient presented with back pain and then became unresponsive in MRI, CPR started, patient given CPR 2 rounds and 2 epis, pulse returned and intubated morning of 5/31 after cardiac arrest. Currently telemetry says afib. Cardiology eval possible consistent with a respiratory rather than cardiac etiology of pulseless electrical activity. Continuing anticoagulation. Not candidate for hypothermia.   Interval history: In ICU, on ventilator, hep drip, amiodarone, levophed, sedated, febrile, atelectasis and pneumonia on zosyn and vanc, chf with questionable pulmonary congestion and cardiogenic shock s/p cardiac arrest, AKI on CKD  OBJECTIVE Vitals:   04/09/18 0800 04/09/18 0900 04/09/18 0945 04/09/18 1000  BP: 92/69 97/66 (!) 124/95 131/81  Pulse: (!) 102 97 (!) 114 (!) 110  Resp: 20 20 (!) 21 20  Temp: 100.1 F (37.8 C)     TempSrc: Axillary     SpO2: 94% 95% 93% 92%  Weight:      Height:        CBC:  Recent Labs  Lab 04/07/18 0546 04/09/18 0351  WBC 9.4 9.6  NEUTROABS  --  4.9  HGB 18.2* 15.9  HCT 54.7* 48.7  MCV 86.4 85.6  PLT 231 196    Basic Metabolic Panel:  Recent Labs  Lab 04/07/18 1219  04/08/18 0544  04/08/18 1754 04/09/18 0351 04/09/18 1013  NA 133*   < > 140   < > 138 136  --   K 4.6   < > 3.7   < > 3.1* 6.0* 4.3  CL 89*   < > 98*   < > 100* 98*  --   CO2 30   < > 29   < > 29 28  --   GLUCOSE 475*   < > 209*   < > 129* 315*  --   BUN 67*   < > 43*   < > 34* 36*  --   CREATININE 2.71*   <  > 2.31*   < > 2.33* 2.81*  --   CALCIUM 8.1*   < > 8.2*   < > 7.5* 7.7*  --   MG 1.9  --  1.8  --   --   --   --   PHOS 3.3  --  2.1*  --   --   --   --    < > = values in this interval not displayed.    Lipid Panel:     Component Value Date/Time   CHOL 175 12/09/2017 1017   TRIG 325 (H) 04/07/2018 1056   HDL 31 (L) 12/09/2017 1017   CHOLHDL 5.6 (H) 12/09/2017 1017   LDLCALC 77 12/09/2017 1017   HgbA1c:  Lab Results  Component Value Date   HGBA1C 11.7 (H) 03/29/2018   Urine Drug Screen: No results found for: LABOPIA, COCAINSCRNUR, LABBENZ, AMPHETMU, THCU, LABBARB  Alcohol Level No results found for: ETH  IMAGING  Ct Head Wo Contrast  Result Date: 04/09/2018 CLINICAL DATA:  47 year old with acute mental status changes after cardiac arrest. Possible new onset seizures. EXAM: CT HEAD WITHOUT CONTRAST TECHNIQUE: Contiguous axial images were  obtained from the base of the skull through the vertex without intravenous contrast. COMPARISON:  04/07/2018. FINDINGS: Brain: Ventricular system normal in size and appearance for age. Physiologic calcifications in the basal ganglia bilaterally as noted previously. No mass lesion. No midline shift. No acute hemorrhage or hematoma. No extra-axial fluid collections. No evidence of acute infarction. No evidence of cerebral edema at this time. Vascular: Moderate BILATERAL carotid siphon and vertebrobasilar atherosclerosis. No hyperdense vessel. Skull: No skull fracture or other focal osseous abnormality involving the skull. Sinuses/Orbits: Visualized paranasal sinuses, bilateral mastoid air cells and bilateral middle ear cavities well-aerated. Visualized orbits and globes normal in appearance. Other: None. IMPRESSION: No acute intracranial abnormality. Electronically Signed   By: Hulan Saas M.D.   On: 04/09/2018 11:20   Ct Head Wo Contrast  Result Date: 04/07/2018 CLINICAL DATA:  Cardiac arrest EXAM: CT HEAD WITHOUT CONTRAST TECHNIQUE: Contiguous  axial images were obtained from the base of the skull through the vertex without intravenous contrast. COMPARISON:  None. FINDINGS: Brain: No evidence of acute infarction, hemorrhage, hydrocephalus, extra-axial collection or mass lesion/mass effect. Vascular: No hyperdense vessel or unexpected calcification. Skull: Negative Sinuses/Orbits: Negative Other: None IMPRESSION: Negative CT head Electronically Signed   By: Marlan Palau M.D.   On: 04/07/2018 15:25   Dg Chest Port 1 View  Result Date: 04/09/2018 CLINICAL DATA:  Pneumonia EXAM: PORTABLE CHEST 1 VIEW COMPARISON:  April 08, 2018 FINDINGS: The ETT is in good position. The distal NG tube terminates below today's film. Stable cardiomegaly. Bibasilar opacities may represent small effusions with underlying atelectasis, stable on the right and mildly increased on the left. The right IJ is stable. No other interval changes. IMPRESSION: 1. Support apparatus as above. 2. Bibasilar opacities may represent small effusions and underlying atelectasis, stable on the right and mildly increased on the left in the interval. Electronically Signed   By: Gerome Sam III M.D   On: 04/09/2018 07:14   Dg Chest Port 1 View  Result Date: 04/08/2018 CLINICAL DATA:  Followup pneumonia. EXAM: PORTABLE CHEST 1 VIEW COMPARISON:  04/07/2018 and older exams. FINDINGS: Lung volumes are low. This accentuates the appearance of vascular congestion and interstitial prominence. There is mild linear atelectasis in the right mid lung, stable from the prior exam. Bilateral lung base opacity is most likely atelectasis. Pneumonia is possible. No overt pulmonary edema. Is Stable changes from prior cardiac surgery. Cardiac silhouette is mildly enlarged. Endotracheal tube, right internal jugular central venous line and nasal/orogastric tube are stable. IMPRESSION: 1. No significant change from the previous day's study. 2. Vascular congestion with interstitial prominence, but without overt  pulmonary edema. 3. Basilar atelectasis.  Cannot exclude pneumonia. Electronically Signed   By: Amie Portland M.D.   On: 04/08/2018 07:23        PHYSICAL EXAM diminished breath sounds at bases, irregular rhythm, normocephalic atraumatic, febrile, Per nurse was alert off sedation earlier, nodding head. Moving all 4 extremities. Now heavily sedated due to agitation. +PERRL but pinpoint, conjugate midline gaze, + cough, +gag, not tracking, face symmetric, + doll's eye, resists opening of eyes and grimaces, moving all extremities to stim, toes equiv, difficulty with reflexes due to extremely large body habitus but no clonus and normal tone.   ASSESSMENT/PLAN  47 y.o. male with below list of chronic medical conditions including CHF CKD, CHF,known aortic valve stenosis status post remote mechanical valve replacement with left ventricular hypertrophy with subtherapeutic warfarin,  arrhythmias, HTN, OSA, HLD, DM2, diabetes and sciatica presents to the  emergency department with progressively worsening low back pain Became unresponsive in MRI, CPR started, patient given CPR 2 rounds and 2 epis, pulse returned and intubated morning of 5/31. Currently telemetry says afib. Cardiology eval possible consistent with a respiratory rather than cardiac etiology of pulseless electrical activity. Continuing anticoagulation. Not candidate for hypothermia.   - CT of the head unremarkable today no indication of hypoxic brain injury, stroke, infection. He is too large for the MRI machine.   - Multiple reasons for encephalopathy including on ventilator sedation, febrile, atelectasis and pneumonia on zosyn and vanc, chf with questionable pulmonary congestion and cardiogenic shock s/p cardiac arrest, AKI on CKD. Continue management of metabolic and infectious etiologies which is likely cause of AMS.  - Loaded with Keppra for possible partial seizure, continue 750bid and recommend EEG tomorrow  - No suggestion of anoxic brain  injury. Continue keppra. Please reconsult neurology if needed.   To contact Stroke Continuity provider, please refer to [email or web address]. After hours, contact General Neurology

## 2018-04-09 NOTE — Progress Notes (Signed)
Patient remains intubated on the ventilator. Fever x 1 this shift improved with PRN tylenol. Sedation wean this am, patient was able to nod head to questions, move bilateral upper and lower extremities purposefully, turn head toward voice, did not seem to track RN with eye contact. Pt resedated and head CT ordered/completed per intensivist, neuro consulted. Patient remains on propofol and fentanyl for sedation, arousable to voice/stimulation, resting quietly between care. Levophed drip infusing with vital signs stable. Insulin and heparin drips infusing. Amiodarone drip discontinued this shift. Urine output ample via foley catheter. Tube feedings infusing, pt tolerating, no BM this shift. Remains in afib with rate elevated during sedation wean and when febrile, now 90-110bpm. Wife at bedside throughout shift. [facility] who will take over care of patient going forward.

## 2018-04-09 NOTE — Progress Notes (Signed)
[place] for heparin drip management/warfarin management  Indication: atrial fibrillation and aortic valve replacement   Allergies  Allergen Reactions  . Lexapro [Escitalopram Oxalate] Swelling    Patient Measurements: Height: 5' 10"  (177.8 cm)(based on previous notes) Weight: (!) 369 lb 14.9 oz (167.8 kg) IBW/kg (Calculated) : 73 Heparin Dosing Weight: 111kg  Vital Signs: Temp: 100.2 F (37.9 C) (06/02 0400) Temp Source: Axillary (06/02 0400) BP: 79/63 (06/02 0500) Pulse Rate: 92 (06/02 0500)  Labs: Recent Labs    04/07/18 0546  04/07/18 1911 04/07/18 2218 04/07/18 2232 04/08/18 0427 04/08/18 0544 04/08/18 1053 04/08/18 1754 04/09/18 0351  HGB 18.2*  --   --   --   --   --   --   --   --  15.9  HCT 54.7*  --   --   --   --   --   --   --   --  48.7  PLT 231  --   --   --   --   --   --   --   --  196  LABPROT 19.8*  --   --   --   --   --  20.5*  --   --  18.9*  INR 1.70  --   --   --   --   --  1.77  --   --  1.60  HEPARINUNFRC  --   --   --   --  0.33  --  0.36  --   --  0.23*  CREATININE 2.50*   < > 2.44* 2.48*  --  2.39* 2.31* 2.34* 2.33* 2.81*  TROPONINI 0.03*   < > 0.17* 0.17*  --  0.16*  --   --   --   --    < > = values in this interval not displayed.    Estimated Creatinine Clearance: 50.4 mL/min (A) (by C-G formula based on SCr of 2.81 mg/dL (H)).   Medical History: Past Medical History:  Diagnosis Date  . Allergy   . Anxiety   . Arrhythmia   . Arthritis   . Atrial fibrillation ([place])   . CHF (congestive heart failure) ([place])   . CKD (chronic kidney disease)   . Diabetes mellitus without complication ([place])   . Diabetes mellitus, type II ([place])   . Heart failure ([place])   . Hyperlipidemia   . Hypertension   . Pancreatitis   . Sleep apnea     Medications:  Scheduled:  . chlorhexidine gluconate (MEDLINE KIT)  15 mL Mouth Rinse BID  . dextrose  1 ampule Intravenous Once  . famotidine  20 mg Per Tube BID   . feeding supplement (PRO-STAT SUGAR FREE 64)  30 mL Per Tube BID  . feeding supplement (VITAL HIGH PROTEIN)  1,000 mL Per Tube Q24H  . fentaNYL (SUBLIMAZE) injection  50 mcg Intravenous Once  . furosemide  20 mg Intravenous BID  . insulin aspart  0-9 Units Subcutaneous Q4H  . insulin aspart  10 Units Intravenous Once  . insulin glargine  24 Units Subcutaneous Daily  . mouth rinse  15 mL Mouth Rinse 10 times per day  . sodium bicarbonate  50 mEq Intravenous Once   Infusions:  . sodium chloride    . 0.9 % NaCl with KCl 20 mEq / L 75 mL/hr at 04/08/18 2246  . amiodarone 60 mg/hr (04/09/18 0134)  . amiodarone    .  fentaNYL infusion INTRAVENOUS 200 mcg/hr (04/08/18 2143)  . heparin 1,600 Units/hr (04/08/18 2247)  . norepinephrine (LEVOPHED) Adult infusion 12 mcg/min (04/09/18 0502)  . piperacillin-tazobactam (ZOSYN)  IV Stopped (04/09/18 0140)  . propofol (DIPRIVAN) infusion 40 mcg/kg/min (04/09/18 0507)    Assessment: Pharmacy consulted for warfarin and heparin drip management for 47 yo male admitted to the ICU s/p cardiac arrest. Patient has history significant for aortic valve replacement, atrial fibrillation and CHF. Patient currently requiring amiodarone infusion and blood pressure support from norepinephrine. Head CT is negative for infarction or hemorrhage.   Patient takes warfarin 54m daily as an outpatient for goal INR 2.5-3.5.   Heparin infusing at 1600 units/hr.   Goal of Therapy:  INR 2-3 Heparin level 0.3-0.7 units/ml Monitor platelets by anticoagulation protocol: Yes   Plan:  Heparin:Hepain level subtherapeutic at 0.23, will increase infusion to 1800 units/hr. Will obtain next heparin level in 6 hours  Warfarin: plan is to hold warfarin until patient is more stable. Will F/U on plan to resume warfarin. INR was subtherapeutic on admission at 1.60.  6/1  INR  1.77 6/2  INR  1.60   Will obtain INR with am labs.   Pharmacy will continue to monitor and adjust per  consult.   SPernell Dupre PharmD, BCPS Clinical Pharmacist 04/09/2018 5:14 AM

## 2018-04-09 NOTE — Progress Notes (Signed)
[facility] [place] [facility]   PATIENT NAME: Alexander Alexander    MR#:  [id number]  DATE OF BIRTH:  1970/12/01  SUBJECTIVE:   Patient intubated and on the ventilator. Family in the room.  REVIEW OF SYSTEMS:   Review of Systems  Unable to perform ROS: Intubated   Tolerating Diet:Tube feeding Tolerating PT: intubated  DRUG ALLERGIES:   Allergies  Allergen Reactions  . Lexapro [Escitalopram Oxalate] Swelling    VITALS:  Blood pressure (!) 63/52, pulse 98, temperature 99.4 F (37.4 C), temperature source Oral, resp. rate 20, height 5\' 10"  (1.778 Alexander), weight (!) 167.8 kg (369 lb 14.9 oz), SpO2 94 %.  PHYSICAL EXAMINATION:   Physical Exam  GENERAL:  47 y.o.-year-old patient lying in the bed with no acute distress. Morbidly obese EYES: Pupils equal, round, reactive to light and accommodation. No scleral icterus.  HEENT: Head atraumatic, normocephalic. Oropharynx and nasopharynx clear. Intubated and on the vent NECK:  Supple, no jugular venous distention. No thyroid enlargement, no tenderness.  LUNGS: Normal breath sounds bilaterally, no wheezing, rales, rhonchi. No use of accessory muscles of respiration.  CARDIOVASCULAR: S1, S2 normal. No murmurs, rubs, or gallops.  ABDOMEN: Soft, nontender, nondistended. Bowel sounds present. No organomegaly or mass.  EXTREMITIES: No cyanosis, clubbing or edema b/l.    NEUROLOGIC: unable to assess --pt intubated PSYCHIATRIC: intubated  SKIN: No obvious rash, lesion, or ulcer.   LABORATORY PANEL:  CBC Recent Labs  Lab 04/09/18 0351  WBC 9.6  HGB 15.9  HCT 48.7  PLT 196    Chemistries  Recent Labs  Lab 04/08/18 0544  04/09/18 0351  04/09/18 1548 04/09/18 2132  NA 140   < > 136  --   --   --   K 3.7   < > 6.0*   < > 3.9 4.0  CL 98*   < > 98*  --   --   --   CO2 29   < > 28  --   --   --   GLUCOSE 209*   < > 315*  --   --   --   BUN 43*   < > 36*  --   --   --   CREATININE 2.31*   < > 2.81*  --   --    --   CALCIUM 8.2*   < > 7.7*  --   --   --   MG 1.8  --   --   --  1.6*  --   AST 41  --   --   --   --   --   ALT 34  --   --   --   --   --   ALKPHOS 49  --   --   --   --   --   BILITOT 1.7*  --   --   --   --   --    < > = values in this interval not displayed.   Cardiac Enzymes Recent Labs  Lab 04/08/18 0427  TROPONINI 0.16*   RADIOLOGY:  Ct Head Wo Contrast  Result Date: 04/09/2018 CLINICAL DATA:  47 year old with acute mental status changes after cardiac arrest. Possible new onset seizures. EXAM: CT HEAD WITHOUT CONTRAST TECHNIQUE: Contiguous axial images were obtained from the base of the skull through the vertex without intravenous contrast. COMPARISON:  04/07/2018. FINDINGS: Brain: Ventricular system normal in size and appearance for age. Physiologic calcifications  in the basal ganglia bilaterally as noted previously. No mass lesion. No midline shift. No acute hemorrhage or hematoma. No extra-axial fluid collections. No evidence of acute infarction. No evidence of cerebral edema at this time. Vascular: Moderate BILATERAL carotid siphon and vertebrobasilar atherosclerosis. No hyperdense vessel. Skull: No skull fracture or other focal osseous abnormality involving the skull. Sinuses/Orbits: Visualized paranasal sinuses, bilateral mastoid air cells and bilateral middle ear cavities well-aerated. Visualized orbits and globes normal in appearance. Other: None. IMPRESSION: No acute intracranial abnormality. Electronically Signed   By: Hulan Saas Alexander.D.   On: 04/09/2018 11:20   Dg Chest Port 1 View  Result Date: 04/09/2018 CLINICAL DATA:  Pneumonia EXAM: PORTABLE CHEST 1 VIEW COMPARISON:  April 08, 2018 FINDINGS: The ETT is in good position. The distal NG tube terminates below today's film. Stable cardiomegaly. Bibasilar opacities may represent small effusions with underlying atelectasis, stable on the right and mildly increased on the left. The right IJ is stable. No other interval  changes. IMPRESSION: 1. Support apparatus as above. 2. Bibasilar opacities may represent small effusions and underlying atelectasis, stable on the right and mildly increased on the left in the interval. Electronically Signed   By: Gerome Sam III Alexander.D   On: 04/09/2018 07:14   Dg Chest Port 1 View  Result Date: 04/08/2018 CLINICAL DATA:  Followup pneumonia. EXAM: PORTABLE CHEST 1 VIEW COMPARISON:  04/07/2018 and older exams. FINDINGS: Lung volumes are low. This accentuates the appearance of vascular congestion and interstitial prominence. There is mild linear atelectasis in the right mid lung, stable from the prior exam. Bilateral lung base opacity is most likely atelectasis. Pneumonia is possible. No overt pulmonary edema. Is Stable changes from prior cardiac surgery. Cardiac silhouette is mildly enlarged. Endotracheal tube, right internal jugular central venous line and nasal/orogastric tube are stable. IMPRESSION: 1. No significant change from the previous day's study. 2. Vascular congestion with interstitial prominence, but without overt pulmonary edema. 3. Basilar atelectasis.  Cannot exclude pneumonia. Electronically Signed   By: Amie Portland Alexander.D.   On: 04/08/2018 07:23   ASSESSMENT AND PLAN:  Alexander Alexander  is a 47 y.o. male who presents with complaint of low back pain.  Patient is a known history of sciatica, medicated for similar complaint of low back pain with radiation down his legs.  Initially he told ED physician and the pain was much worse than it has been for him, and so MRI was ordered.  While patient was in radiology department waiting to start MRI he had a cardiac arrest.   *Acute respiratory failure with hypoxemia initially thought to be from cardiac arrest [facility]) - - He was intubated during his code event, and is being sent to the ICU for further treatment.    follow with intensivist currently FiO2-70% vent management by intensivist   *Pulseless electric activity after back pain  possibly consistent with a respiratory event rather than a cardiac event per cardiology -Patient is hemodynamically stable -He has been placed on antiarrhythmics, and cardiology  following.  *  Chronic combined systolic (congestive) and diastolic (congestive) heart failure (HCC) -also potentially causing some arrhythmias, including his ventricular rhythm which may have been responsible for his cardiac arrest.  He had significant runs of V. tach in the ED postarrest.  -Patient is off IV amiodarone.  *HTN (hypertension) -hold home dose antihypertensives for now as the antiarrhythmics and his code event have dropped his blood pressure.  Patient is currently on IV pressors  *  T2DM (  type 2 diabetes mellitus) (HCC) -sliding scale insulin with corresponding glucose checks  *  Atrial fibrillation (HCC) -continue rate controlling medications as well as anticoagulation with IV heparin, consider changing back over to warfarin with INR goal between 2-3  *  Aortic valve replaced -continue anticoagulation with IV heparin and consider changing back over to warfarin with INR goal between 2-3  *  OSA (obstructive sleep apnea) -currently intubated, once he is extubated he will need CPAP nightly  *  CKD (chronic kidney disease) stage 3, GFR 30-59 ml/min (HCC) -at baseline, monitor and avoid nephrotoxins -consider nephrology consult if needed  *  Hyperlipidemia associated with type 2 diabetes mellitus (HCC) -continue home dose antilipid  Spoke with mom and wife  Case discussed with Care Management/Social Worker. Management plans discussed with the patient, wife at bedside and they are in agreement.  CODE STATUS: full  DVT Prophylaxis: heparin gtt  TOTAL TIME TAKING CARE OF THIS PATIENT: 33 minutes.  >50% time spent on counselling and coordination of care  POSSIBLE D/C IN few DAYS, DEPENDING ON CLINICAL CONDITION.  Note: This dictation was prepared with Dragon dictation along with smaller phrase  technology. Any transcriptional errors that result from this process are unintentional.  Ramonita LabAruna Morningstar Alexander Alexander.D on 04/09/2018 at 10:42 PM  Between 7am to 6pm - Pager - [phone]  After 6pm go to [email or web address] - Social research officer, governmentpassword [email or web address] [facility]  [facility]  Office  [phone]  CC: Primary care physician; Alexander Alexander, Alexander Alexander, MDPatient ID: Alexander Riffletis Alexander Alexander, male   DOB: 12-Aug-1971, 47 y.o.   MRN: [id number]

## 2018-04-09 NOTE — Progress Notes (Signed)
Pharmacy Electrolyte Monitoring Consult:  Pharmacy consulted to assist in monitoring and replacing electrolytes in this 47 y.o. male admitted on 04/07/2018 s/p cardiac arrest and requiring mechanical ventilation. Insulin infusion discontinued 6/1  Labs:  Sodium (mmol/L)  Date Value  04/09/2018 136  03/29/2018 142   Potassium (mmol/L)  Date Value  04/09/2018 3.9   Magnesium (mg/dL)  Date Value  59/56/387506/12/2017 1.6 (L)   Phosphorus (mg/dL)  Date Value  64/33/295106/12/2017 2.5   Calcium (mg/dL)  Date Value  88/41/660606/12/2017 7.7 (L)   Albumin (g/dL)  Date Value  30/16/010906/11/2017 3.5  12/09/2017 4.2    Assessment/Plan: Pt currently on insulin drip. Mg=1.6, K=3.9, phos=2.5 Will give Mg IV 4g once.  Mg and phos labs ordered for 5A and 5 P. K labs ordered for q 6 hr last lab for 2200 tonight- I have also ordered a BMP for the AM. Continue to follow electrolytes closely.   Pharmacy will continue to monitor and adjust per consult.   Olene FlossMelissa D Jesslynn Kruck, PharmD, BCPS Clinical Pharmacist 04/09/2018 7:05 PM

## 2018-04-09 NOTE — Progress Notes (Signed)
Pharmacy Antibiotic Note  Alexander Alexander is a 47 y.o. male admitted on 04/07/2018 s/p cardiac arrest started on antibiotics for possible aspiration pneumonia.  Pharmacy has been consulted for Zosyn and Vancomycin dosing.  Plan: Zosyn EI 3.375g IV Q8hr.   Will give Vancomycin 1000 mg once followed by Vancomycin 1500 mg Q18H. Vancomycin trough will be obtained prior to 4th dose. Pharmacy will continue to follow.   Procalcitonin elevated at 0.62 >> 1.10 >> 1.12. Will continue to trend procalcitonins.   Ke= 0.046 T1/2 = 15 hours Vd = 77.6 Cmin= 16.5 Stack dose @ 6 hours  Height: 5\' 10"  (177.8 cm)(based on previous notes) Weight: (!) 369 lb 14.9 oz (167.8 kg) IBW/kg (Calculated) : 73  Temp (24hrs), Avg:100.5 F (38.1 C), Min:99.8 F (37.7 C), Max:101.3 F (38.5 C)  Recent Labs  Lab 04/07/18 0546  04/07/18 1233 04/07/18 1546  04/08/18 0427 04/08/18 0544 04/08/18 1053 04/08/18 1754 04/09/18 0351  WBC 9.4  --   --   --   --   --   --   --   --  9.6  CREATININE 2.50*   < >  --   --    < > 2.39* 2.31* 2.34* 2.33* 2.81*  LATICACIDVEN  --   --  2.4* 2.9*  --   --   --   --   --   --    < > = values in this interval not displayed.    Estimated Creatinine Clearance: 50.4 mL/min (A) (by C-G formula based on SCr of 2.81 mg/dL (H)).    Allergies  Allergen Reactions  . Lexapro [Escitalopram Oxalate] Swelling    Antimicrobials this admission: Zosyn 5/31 >>  Vancomycin 6/2 >>  Dose adjustments this admission: N/A  Microbiology results: 5/31 MRSA PCR: neg 6/1 BCx >> NGTD 6/1 UCx >> sent  6/1 Resp Cx/Trach Aspirate >> WBC, mod GPC in clusters, few GNR, rare gram pos rods  Thank you for allowing pharmacy to be a part of this patient's care.  Yolanda BonineHannah Abanoub Hanken, PharmD Pharmacy Resident 04/09/2018 11:17 AM

## 2018-04-09 NOTE — Progress Notes (Signed)
[facility] for heparin drip management/warfarin management  Indication: atrial fibrillation and aortic valve replacement   Allergies  Allergen Reactions  . Lexapro [Escitalopram Oxalate] Swelling    Patient Measurements: Height: 5' 10"  (177.8 cm)(based on previous notes) Weight: (!) 369 lb 14.9 oz (167.8 kg) IBW/kg (Calculated) : 73 Heparin Dosing Weight: 111kg  Vital Signs: Temp: 101.7 F (38.7 C) (06/02 1805) Temp Source: Oral (06/02 1805) BP: 128/88 (06/02 1800) Pulse Rate: 96 (06/02 1805)  Labs: Recent Labs    04/07/18 0546  04/07/18 1911 04/07/18 2218  04/08/18 0427 04/08/18 0544 04/08/18 1053 04/08/18 1754 04/09/18 0351 04/09/18 1220 04/09/18 1845  HGB 18.2*  --   --   --   --   --   --   --   --  15.9  --   --   HCT 54.7*  --   --   --   --   --   --   --   --  48.7  --   --   PLT 231  --   --   --   --   --   --   --   --  196  --   --   LABPROT 19.8*  --   --   --   --   --  20.5*  --   --  18.9*  --   --   INR 1.70  --   --   --   --   --  1.77  --   --  1.60  --   --   HEPARINUNFRC  --   --   --   --    < >  --  0.36  --   --  0.23* 0.39 0.43  CREATININE 2.50*   < > 2.44* 2.48*  --  2.39* 2.31* 2.34* 2.33* 2.81*  --   --   TROPONINI 0.03*   < > 0.17* 0.17*  --  0.16*  --   --   --   --   --   --    < > = values in this interval not displayed.    Estimated Creatinine Clearance: 50.4 mL/min (A) (by C-G formula based on SCr of 2.81 mg/dL (H)).   Medical History: Past Medical History:  Diagnosis Date  . Allergy   . Anxiety   . Arrhythmia   . Arthritis   . Atrial fibrillation ([place])   . CHF (congestive heart failure) ([place])   . CKD (chronic kidney disease)   . Diabetes mellitus without complication ([place])   . Diabetes mellitus, type II ([place])   . Heart failure ([place])   . Hyperlipidemia   . Hypertension   . Pancreatitis   . Sleep apnea     Medications:  Scheduled:  . chlorhexidine gluconate (MEDLINE KIT)  15  mL Mouth Rinse BID  . famotidine  20 mg Per Tube BID  . feeding supplement (PRO-STAT SUGAR FREE 64)  30 mL Per Tube BID  . [START ON 04/10/2018] feeding supplement (VITAL HIGH PROTEIN)  1,000 mL Per Tube Q24H  . fentaNYL (SUBLIMAZE) injection  50 mcg Intravenous Once  . [START ON 04/10/2018] furosemide  20 mg Intravenous Daily  . mouth rinse  15 mL Mouth Rinse 10 times per day   Infusions:  . sodium chloride    . fentaNYL infusion INTRAVENOUS 250 mcg/hr (04/09/18 1800)  . heparin 1,800 Units/hr (04/09/18 1800)  .  insulin (NOVOLIN-R) infusion 18.1 Units/hr (04/09/18 1910)  . levETIRAcetam Stopped (04/09/18 1156)  . magnesium sulfate 1 - 4 g bolus IVPB    . norepinephrine (LEVOPHED) Adult infusion 10 mcg/min (04/09/18 1800)  . piperacillin-tazobactam (ZOSYN)  IV Stopped (04/09/18 1707)  . propofol (DIPRIVAN) infusion 35 mcg/kg/min (04/09/18 1801)  . vancomycin 1,500 mg (04/09/18 1730)    Assessment: Pharmacy consulted for warfarin and heparin drip management for 47 yo male admitted to the ICU s/p cardiac arrest. Patient has history significant for aortic valve replacement, atrial fibrillation and CHF. Patient currently requiring amiodarone infusion and blood pressure support from norepinephrine. Head CT is negative for infarction or hemorrhage.   Patient takes warfarin 36m daily as an outpatient for goal INR 2.5-3.5.   Heparin infusing at 1600 units/hr.   Goal of Therapy:  INR 2-3 Heparin level 0.3-0.7 units/ml Monitor platelets by anticoagulation protocol: Yes   Plan:  Heparin:Hepain level 6/2 @1845 = 0.43, will continue current rate. This is the second therapeutic level. Check level daily along w/ CBC  Warfarin: plan is to hold warfarin until patient is more stable. Will F/U on plan to resume warfarin. INR was subtherapeutic on admission at 1.60.  6/1  INR  1.77 6/2  INR  1.60   Will obtain INR with am labs.   Pharmacy will continue to monitor and adjust per consult.   MRamond Dial PharmD, BCPS Clinical Pharmacist 04/09/2018 7:38 PM

## 2018-04-09 NOTE — Progress Notes (Signed)
[facility] Encounter Note  Patient: Alexander Alexander / Admit Date: 04/07/2018 / Date of Encounter: 04/09/2018, 9:02 AM   Subjective: Patient is hemodynamically stable with current intravenous medication management.  Respiratory status with intubation and oxygenation stable.  Telemetry shows atrial fibrillation with variable heart rate which is unchanged from admission and outpatient.  Troponin level essentially unchanged and normal without evidence of myocardial infarction.  No signs of heart failure. Echocardiogram shows normal LV systolic function with ejection fraction of 60% with moderate left ventricular hypertrophy and normally functioning mechanical aortic valve Review of Systems: Cannot assess due to intubation and sedation  objective: Telemetry: Atrial fibrillation with controlled ventricular rate Physical Exam: Blood pressure 92/69, pulse (!) 102, temperature 100.1 F (37.8 C), temperature source Axillary, resp. rate 20, height 5' 10"  (1.778 m), weight (!) 369 lb 14.9 oz (167.8 kg), SpO2 94 %. Body mass index is 53.08 kg/m. General: Well developed, well nourished,   Head: Normocephalic, atraumatic, sclera non-icteric, no xanthomas, nares are without discharge. Neck: No apparent masses Lungs: Normal respirations with few wheezes, no rhonchi, no rales , no crackles   Heart: Irregular rate and rhythm, normal S1 mechanical S2, no murmur, no rub, no gallop, PMI is normal size and placement, carotid upstroke normal without bruit, jugular venous pressure normal Abdomen: Soft,  , non-distended with normoactive bowel sounds. No hepatosplenomegaly. Abdominal aorta is normal size without bruit Extremities: Trace edema, no clubbing, no cyanosis, no ulcers,  Peripheral: 2+ radial, 2+ femoral, 2+ dorsal pedal pulses Neuro: Not alert and oriented. Moves all extremities spontaneously. Psych: Does not responds to questions appropriately with a normal affect.   Intake/Output  Summary (Last 24 hours) at 04/09/2018 0902 Last data filed at 04/09/2018 0900 Gross per 24 hour  Intake 7321.97 ml  Output 3725 ml  Net 3596.97 ml    Inpatient Medications:  . chlorhexidine gluconate (MEDLINE KIT)  15 mL Mouth Rinse BID  . famotidine  20 mg Per Tube BID  . feeding supplement (PRO-STAT SUGAR FREE 64)  30 mL Per Tube BID  . [START ON 04/10/2018] feeding supplement (VITAL HIGH PROTEIN)  1,000 mL Per Tube Q24H  . fentaNYL (SUBLIMAZE) injection  50 mcg Intravenous Once  . furosemide  20 mg Intravenous BID  . mouth rinse  15 mL Mouth Rinse 10 times per day   Infusions:  . sodium chloride    . amiodarone 30 mg/hr (04/09/18 0800)  . fentaNYL infusion INTRAVENOUS 250 mcg/hr (04/09/18 0800)  . heparin 1,800 Units/hr (04/09/18 0800)  . insulin (NOVOLIN-R) infusion 8.5 Units/hr (04/09/18 0803)  . norepinephrine (LEVOPHED) Adult infusion 13.013 mcg/min (04/09/18 0800)  . piperacillin-tazobactam (ZOSYN)  IV 3.375 g (04/09/18 0523)  . propofol (DIPRIVAN) infusion 35.856 mcg/kg/min (04/09/18 0841)    Labs: Recent Labs    04/07/18 1219  04/08/18 0544  04/08/18 1754 04/09/18 0351  NA 133*   < > 140   < > 138 136  K 4.6   < > 3.7   < > 3.1* 6.0*  CL 89*   < > 98*   < > 100* 98*  CO2 30   < > 29   < > 29 28  GLUCOSE 475*   < > 209*   < > 129* 315*  BUN 67*   < > 43*   < > 34* 36*  CREATININE 2.71*   < > 2.31*   < > 2.33* 2.81*  CALCIUM 8.1*   < > 8.2*   < >  7.5* 7.7*  MG 1.9  --  1.8  --   --   --   PHOS 3.3  --  2.1*  --   --   --    < > = values in this interval not displayed.   Recent Labs    04/07/18 0546 04/08/18 0544  AST 67* 41  ALT 42 34  ALKPHOS 84 49  BILITOT 1.0 1.7*  PROT 8.6* 7.5  ALBUMIN 4.1 3.5   Recent Labs    04/07/18 0546 04/09/18 0351  WBC 9.4 9.6  NEUTROABS  --  4.9  HGB 18.2* 15.9  HCT 54.7* 48.7  MCV 86.4 85.6  PLT 231 196   Recent Labs    04/07/18 1546 04/07/18 1911 04/07/18 2218 04/08/18 0427  TROPONINI 0.16* 0.17* 0.17* 0.16*    Invalid input(s): POCBNP No results for input(s): HGBA1C in the last 72 hours.   Weights: Filed Weights   04/07/18 0222 04/08/18 0429  Weight: (!) 370 lb (167.8 kg) (!) 369 lb 14.9 oz (167.8 kg)     Radiology/Studies:  Dg Chest 1 View  Result Date: 04/07/2018 CLINICAL DATA:  47 y/o  M; intubated. EXAM: CHEST  1 VIEW COMPARISON:  12/06/2017 chest radiograph FINDINGS: Stable cardiomegaly given projection and technique. Transcutaneous pacing pads. Post median sternotomy with wires in alignment. Low lung volumes. Pulmonary vascular congestion. No focal consolidation. Endotracheal tube tip 2.4 cm above the carina. Enteric tube tip appears to project over the lower esophagus, advancement recommended. IMPRESSION: 1. Endotracheal tube tip 2.4 cm above the carina. 2. Cardiomegaly and pulmonary vascular congestion. 3. Enteric tube tip appears to project over the lower esophagus, advancement recommended. Electronically Signed   By: Kristine Garbe M.D.   On: 04/07/2018 06:22   Ct Head Wo Contrast  Result Date: 04/07/2018 CLINICAL DATA:  Cardiac arrest EXAM: CT HEAD WITHOUT CONTRAST TECHNIQUE: Contiguous axial images were obtained from the base of the skull through the vertex without intravenous contrast. COMPARISON:  None. FINDINGS: Brain: No evidence of acute infarction, hemorrhage, hydrocephalus, extra-axial collection or mass lesion/mass effect. Vascular: No hyperdense vessel or unexpected calcification. Skull: Negative Sinuses/Orbits: Negative Other: None IMPRESSION: Negative CT head Electronically Signed   By: Franchot Gallo M.D.   On: 04/07/2018 15:25   Dg Chest Port 1 View  Result Date: 04/09/2018 CLINICAL DATA:  Pneumonia EXAM: PORTABLE CHEST 1 VIEW COMPARISON:  April 08, 2018 FINDINGS: The ETT is in good position. The distal NG tube terminates below today's film. Stable cardiomegaly. Bibasilar opacities may represent small effusions with underlying atelectasis, stable on the right and  mildly increased on the left. The right IJ is stable. No other interval changes. IMPRESSION: 1. Support apparatus as above. 2. Bibasilar opacities may represent small effusions and underlying atelectasis, stable on the right and mildly increased on the left in the interval. Electronically Signed   By: Dorise Bullion III M.D   On: 04/09/2018 07:14   Dg Chest Port 1 View  Result Date: 04/08/2018 CLINICAL DATA:  Followup pneumonia. EXAM: PORTABLE CHEST 1 VIEW COMPARISON:  04/07/2018 and older exams. FINDINGS: Lung volumes are low. This accentuates the appearance of vascular congestion and interstitial prominence. There is mild linear atelectasis in the right mid lung, stable from the prior exam. Bilateral lung base opacity is most likely atelectasis. Pneumonia is possible. No overt pulmonary edema. Is Stable changes from prior cardiac surgery. Cardiac silhouette is mildly enlarged. Endotracheal tube, right internal jugular central venous line and nasal/orogastric tube are stable. IMPRESSION:  1. No significant change from the previous day's study. 2. Vascular congestion with interstitial prominence, but without overt pulmonary edema. 3. Basilar atelectasis.  Cannot exclude pneumonia. Electronically Signed   By: Lajean Manes M.D.   On: 04/08/2018 07:23   Dg Chest Portable 1 View  Result Date: 04/07/2018 CLINICAL DATA:  Central catheter placement.  Hypoxia. EXAM: PORTABLE CHEST 1 VIEW COMPARISON:  Apr 07, 2018 study obtained earlier in the day FINDINGS: Right jugular catheter tip is in the superior vena cava near the cavoatrial junction. Endotracheal tube tip is 4.0 cm above the carina. Nasogastric tube tip and side port are below the diaphragm. No pneumothorax. There is bibasilar atelectasis. Lungs elsewhere are clear. 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 No adenopathy. No bone lesions. Patient is status post median sternotomy with aortic valve replacement. IMPRESSION: Tube and catheter  positions as described without pneumothorax. There is pulmonary vascular congestion. Bibasilar atelectasis present. No consolidation evident. Electronically Signed   By: Lowella Grip III M.D.   On: 04/07/2018 08:51   Dg Abd Portable 1v  Result Date: 04/07/2018 CLINICAL DATA:  NG placement EXAM: PORTABLE ABDOMEN - 1 VIEW COMPARISON:  None. FINDINGS: Limited exam due to large patient size and difficulty with positioning. NG tip in the proximal stomach. NG side hole in the distal esophagus. No dilated bowel loops. IMPRESSION: NG tube in the proximal stomach with the side hole in the distal esophagus. Recommend advancing at least 5 cm. Electronically Signed   By: Franchot Gallo M.D.   On: 04/07/2018 10:48     Assessment and Recommendation  47 y.o. male with known aortic valve stenosis status post remote mechanical valve replacement with left ventricular hypertrophy essential hypertension mixed hyperlipidemia chronic kidney disease sleep apnea having an episode of pulseless electrical activity after back pain possibly consistent with a respiratory event rather than a cardiac event without evidence of myocardial infarction heart failure and continuing to be hemodynamically stable 1.  Continue supportive care from the respiratory standpoint and sleep apnea standpoint with possible aspiration and pulmonary concerns 2.  Continue anticoagulation for further risk reduction in stroke with atrial fibrillation and mechanical aortic valve with heparin and therefore consider changing back over to warfarin for goal INR between 2 and 3 3.  Consider discontinuation of amiodarone due to likely inability to convert patient to normal sinus rhythm due to the fact that the patient has had chronic atrial fibrillation and no apparent need for treatment of wide-complex tachycardia at this time which has not recurred and likely secondary to specific event of pulseless electrical activity 4.  No further cardiac diagnostics  necessary at this time 5.  All of further questions Signed, Serafina Royals M.D. FACC

## 2018-04-09 NOTE — Progress Notes (Addendum)
Pharmacy Electrolyte Monitoring Consult:  Pharmacy consulted to assist in monitoring and replacing electrolytes in this 47 y.o. male admitted on 04/07/2018 s/p cardiac arrest and requiring mechanical ventilation. Insulin infusion discontinued 6/1  Labs:  Sodium (mmol/L)  Date Value  04/09/2018 136  03/29/2018 142   Potassium (mmol/L)  Date Value  04/09/2018 6.0 (H)   Magnesium (mg/dL)  Date Value  16/10/960406/11/2017 1.8   Phosphorus (mg/dL)  Date Value  54/09/811906/11/2017 2.1 (L)   Calcium (mg/dL)  Date Value  14/78/295606/12/2017 7.7 (L)   Albumin (g/dL)  Date Value  21/30/865706/11/2017 3.5  12/09/2017 4.2    Assessment/Plan: 6/2 K+ elevated at 6.0 this morning. Insulin drip restarted on 6/2. Nurse reports she has stopped NaCl w/5820mEq at this time. Aspart 10 x 1 ordered.   No electrolyte replacement is needed at this time.    Pharmacy will continue to monitor and adjust per consult.   Gardner CandleSheema M Kamera Dubas, PharmD, BCPS Clinical Pharmacist 04/09/2018 5:16 AM

## 2018-04-09 NOTE — Progress Notes (Signed)
[place] for heparin drip management/warfarin management  Indication: atrial fibrillation and aortic valve replacement   Allergies  Allergen Reactions  . Lexapro [Escitalopram Oxalate] Swelling    Patient Measurements: Height: 5' 10"  (177.8 cm)(based on previous notes) Weight: (!) 369 lb 14.9 oz (167.8 kg) IBW/kg (Calculated) : 73 Heparin Dosing Weight: 111kg  Vital Signs: Temp: 101.8 F (38.8 C) (06/02 1235) Temp Source: Oral (06/02 1235) BP: 119/82 (06/02 1200) Pulse Rate: 97 (06/02 1200)  Labs: Recent Labs    04/07/18 0546  04/07/18 1911 04/07/18 2218  04/08/18 0427 04/08/18 0544 04/08/18 1053 04/08/18 1754 04/09/18 0351 04/09/18 1220  HGB 18.2*  --   --   --   --   --   --   --   --  15.9  --   HCT 54.7*  --   --   --   --   --   --   --   --  48.7  --   PLT 231  --   --   --   --   --   --   --   --  196  --   LABPROT 19.8*  --   --   --   --   --  20.5*  --   --  18.9*  --   INR 1.70  --   --   --   --   --  1.77  --   --  1.60  --   HEPARINUNFRC  --   --   --   --    < >  --  0.36  --   --  0.23* 0.39  CREATININE 2.50*   < > 2.44* 2.48*  --  2.39* 2.31* 2.34* 2.33* 2.81*  --   TROPONINI 0.03*   < > 0.17* 0.17*  --  0.16*  --   --   --   --   --    < > = values in this interval not displayed.    Estimated Creatinine Clearance: 50.4 mL/min (A) (by C-G formula based on SCr of 2.81 mg/dL (H)).   Medical History: Past Medical History:  Diagnosis Date  . Allergy   . Anxiety   . Arrhythmia   . Arthritis   . Atrial fibrillation ([place])   . CHF (congestive heart failure) ([place])   . CKD (chronic kidney disease)   . Diabetes mellitus without complication ([place])   . Diabetes mellitus, type II ([place])   . Heart failure ([place])   . Hyperlipidemia   . Hypertension   . Pancreatitis   . Sleep apnea     Medications:  Scheduled:  . chlorhexidine gluconate (MEDLINE KIT)  15 mL Mouth Rinse BID  . famotidine  20 mg Per Tube BID   . feeding supplement (PRO-STAT SUGAR FREE 64)  30 mL Per Tube BID  . [START ON 04/10/2018] feeding supplement (VITAL HIGH PROTEIN)  1,000 mL Per Tube Q24H  . fentaNYL (SUBLIMAZE) injection  50 mcg Intravenous Once  . [START ON 04/10/2018] furosemide  20 mg Intravenous Daily  . mouth rinse  15 mL Mouth Rinse 10 times per day   Infusions:  . sodium chloride    . fentaNYL infusion INTRAVENOUS 250 mcg/hr (04/09/18 0957)  . heparin 1,800 Units/hr (04/09/18 0800)  . insulin (NOVOLIN-R) infusion 16.9 Units/hr (04/09/18 1250)  . levETIRAcetam Stopped (04/09/18 1156)  . norepinephrine (LEVOPHED) Adult infusion 10 mcg/min (04/09/18 1250)  .  piperacillin-tazobactam (ZOSYN)  IV 3.375 g (04/09/18 1307)  . propofol (DIPRIVAN) infusion 35.856 mcg/kg/min (04/09/18 1250)  . vancomycin      Assessment: Pharmacy consulted for warfarin and heparin drip management for 47 yo male admitted to the ICU s/p cardiac arrest. Patient has history significant for aortic valve replacement, atrial fibrillation and CHF. Patient currently requiring amiodarone infusion and blood pressure support from norepinephrine. Head CT is negative for infarction or hemorrhage.   Patient takes warfarin 49m daily as an outpatient for goal INR 2.5-3.5.   Heparin infusing at 1600 units/hr.   Goal of Therapy:  INR 2-3 Heparin level 0.3-0.7 units/ml Monitor platelets by anticoagulation protocol: Yes   Plan:  Heparin:Hepain level 6/2 @ 1220= 0.39, will continue current rate. Will obtain confirmatory heparin level in 6 hours  Warfarin: plan is to hold warfarin until patient is more stable. Will F/U on plan to resume warfarin. INR was subtherapeutic on admission at 1.60.  6/1  INR  1.77 6/2  INR  1.60   Will obtain INR with am labs.   Pharmacy will continue to monitor and adjust per consult.   MNoralee Space PharmD, BCPS Clinical Pharmacist 04/09/2018 1:07 PM

## 2018-04-10 ENCOUNTER — Inpatient Hospital Stay: Payer: Medicare HMO

## 2018-04-10 LAB — CULTURE, RESPIRATORY: SPECIAL REQUESTS: NORMAL

## 2018-04-10 LAB — GLUCOSE, CAPILLARY
GLUCOSE-CAPILLARY: 191 mg/dL — AB (ref 65–99)
GLUCOSE-CAPILLARY: 208 mg/dL — AB (ref 65–99)
GLUCOSE-CAPILLARY: 210 mg/dL — AB (ref 65–99)
GLUCOSE-CAPILLARY: 213 mg/dL — AB (ref 65–99)
GLUCOSE-CAPILLARY: 214 mg/dL — AB (ref 65–99)
GLUCOSE-CAPILLARY: 218 mg/dL — AB (ref 65–99)
GLUCOSE-CAPILLARY: 219 mg/dL — AB (ref 65–99)
GLUCOSE-CAPILLARY: 238 mg/dL — AB (ref 65–99)
GLUCOSE-CAPILLARY: 239 mg/dL — AB (ref 65–99)
GLUCOSE-CAPILLARY: 266 mg/dL — AB (ref 65–99)
Glucose-Capillary: 138 mg/dL — ABNORMAL HIGH (ref 65–99)
Glucose-Capillary: 150 mg/dL — ABNORMAL HIGH (ref 65–99)
Glucose-Capillary: 151 mg/dL — ABNORMAL HIGH (ref 65–99)
Glucose-Capillary: 152 mg/dL — ABNORMAL HIGH (ref 65–99)
Glucose-Capillary: 196 mg/dL — ABNORMAL HIGH (ref 65–99)
Glucose-Capillary: 206 mg/dL — ABNORMAL HIGH (ref 65–99)
Glucose-Capillary: 209 mg/dL — ABNORMAL HIGH (ref 65–99)
Glucose-Capillary: 213 mg/dL — ABNORMAL HIGH (ref 65–99)
Glucose-Capillary: 221 mg/dL — ABNORMAL HIGH (ref 65–99)
Glucose-Capillary: 223 mg/dL — ABNORMAL HIGH (ref 65–99)
Glucose-Capillary: 234 mg/dL — ABNORMAL HIGH (ref 65–99)
Glucose-Capillary: 241 mg/dL — ABNORMAL HIGH (ref 65–99)
Glucose-Capillary: 282 mg/dL — ABNORMAL HIGH (ref 65–99)

## 2018-04-10 LAB — CBC
HEMATOCRIT: 47.2 % (ref 40.0–52.0)
Hemoglobin: 15.7 g/dL (ref 13.0–18.0)
MCH: 28.1 pg (ref 26.0–34.0)
MCHC: 33.3 g/dL (ref 32.0–36.0)
MCV: 84.4 fL (ref 80.0–100.0)
PLATELETS: 173 10*3/uL (ref 150–440)
RBC: 5.59 MIL/uL (ref 4.40–5.90)
RDW: 18.4 % — AB (ref 11.5–14.5)
WBC: 9.4 10*3/uL (ref 3.8–10.6)

## 2018-04-10 LAB — PROTIME-INR
INR: 1.34
PROTHROMBIN TIME: 16.5 s — AB (ref 11.4–15.2)

## 2018-04-10 LAB — CULTURE, RESPIRATORY W GRAM STAIN

## 2018-04-10 LAB — BASIC METABOLIC PANEL
Anion gap: 10 (ref 5–15)
BUN: 34 mg/dL — AB (ref 6–20)
CALCIUM: 8.2 mg/dL — AB (ref 8.9–10.3)
CO2: 29 mmol/L (ref 22–32)
CREATININE: 2.43 mg/dL — AB (ref 0.61–1.24)
Chloride: 99 mmol/L — ABNORMAL LOW (ref 101–111)
GFR calc Af Amer: 35 mL/min — ABNORMAL LOW (ref >60)
GFR, EST NON AFRICAN AMERICAN: 30 mL/min — AB (ref >60)
GLUCOSE: 216 mg/dL — AB (ref 65–99)
Potassium: 4.1 mmol/L (ref 3.5–5.1)
Sodium: 138 mmol/L (ref 135–145)

## 2018-04-10 LAB — HEPARIN LEVEL (UNFRACTIONATED): Heparin Unfractionated: 0.34 IU/mL (ref 0.30–0.70)

## 2018-04-10 LAB — MAGNESIUM
Magnesium: 2.4 mg/dL (ref 1.7–2.4)
Magnesium: 2.1 mg/dL (ref 1.7–2.4)

## 2018-04-10 LAB — TRIGLYCERIDES: Triglycerides: 242 mg/dL — ABNORMAL HIGH (ref <150)

## 2018-04-10 LAB — PHOSPHORUS
Phosphorus: 3.3 mg/dL (ref 2.5–4.6)
Phosphorus: 3.5 mg/dL (ref 2.5–4.6)

## 2018-04-10 LAB — VANCOMYCIN, RANDOM: VANCOMYCIN RM: 12

## 2018-04-10 MED ORDER — FUROSEMIDE 10 MG/ML IJ SOLN
20.0000 mg | INTRAMUSCULAR | Status: AC
  Administered 2018-04-10: 20 mg via INTRAVENOUS
  Filled 2018-04-10: qty 2

## 2018-04-10 MED ORDER — PRO-STAT SUGAR FREE PO LIQD
60.0000 mL | Freq: Every day | ORAL | Status: DC
  Administered 2018-04-10 – 2018-04-17 (×35): 60 mL

## 2018-04-10 MED ORDER — AMIODARONE LOAD VIA INFUSION
150.0000 mg | Freq: Once | INTRAVENOUS | Status: AC
  Administered 2018-04-10: 150 mg via INTRAVENOUS
  Filled 2018-04-10: qty 83.34

## 2018-04-10 MED ORDER — AMIODARONE HCL IN DEXTROSE 360-4.14 MG/200ML-% IV SOLN
60.0000 mg/h | INTRAVENOUS | Status: DC
  Administered 2018-04-10 – 2018-04-12 (×6): 30 mg/h via INTRAVENOUS
  Administered 2018-04-13 – 2018-04-17 (×17): 60 mg/h via INTRAVENOUS
  Filled 2018-04-10 (×23): qty 200

## 2018-04-10 MED ORDER — VANCOMYCIN HCL 10 G IV SOLR
1250.0000 mg | Freq: Two times a day (BID) | INTRAVENOUS | Status: DC
  Administered 2018-04-10 – 2018-04-11 (×2): 1250 mg via INTRAVENOUS
  Filled 2018-04-10 (×4): qty 1250

## 2018-04-10 MED ORDER — SODIUM CHLORIDE 0.9 % IV SOLN
3.0000 g | Freq: Four times a day (QID) | INTRAVENOUS | Status: DC
  Administered 2018-04-10 (×2): 3 g via INTRAVENOUS
  Filled 2018-04-10 (×3): qty 3

## 2018-04-10 MED ORDER — PROPOFOL 1000 MG/100ML IV EMUL
0.0000 ug/kg/min | INTRAVENOUS | Status: DC
  Administered 2018-04-10: 18 ug/kg/min via INTRAVENOUS
  Administered 2018-04-10: 15 ug/kg/min via INTRAVENOUS
  Administered 2018-04-11: 14 ug/kg/min via INTRAVENOUS
  Administered 2018-04-11 – 2018-04-12 (×5): 10 ug/kg/min via INTRAVENOUS
  Administered 2018-04-13: 15 ug/kg/min via INTRAVENOUS
  Administered 2018-04-13 (×3): 20 ug/kg/min via INTRAVENOUS
  Administered 2018-04-13: 15 ug/kg/min via INTRAVENOUS
  Administered 2018-04-14: 20 ug/kg/min via INTRAVENOUS
  Administered 2018-04-14 – 2018-04-15 (×5): 15 ug/kg/min via INTRAVENOUS
  Administered 2018-04-15: 10 ug/kg/min via INTRAVENOUS
  Administered 2018-04-15: 15 ug/kg/min via INTRAVENOUS
  Administered 2018-04-16 – 2018-04-17 (×6): 10 ug/kg/min via INTRAVENOUS
  Administered 2018-04-18 (×2): 15 ug/kg/min via INTRAVENOUS
  Filled 2018-04-10 (×30): qty 100

## 2018-04-10 MED ORDER — PROPOFOL 1000 MG/100ML IV EMUL
INTRAVENOUS | Status: AC
  Filled 2018-04-10: qty 100

## 2018-04-10 MED ORDER — GLUCERNA 1.5 CAL PO LIQD
1000.0000 mL | ORAL | Status: DC
  Administered 2018-04-10 – 2018-04-16 (×7): 1000 mL

## 2018-04-10 MED ORDER — ADULT MULTIVITAMIN W/MINERALS CH
1.0000 | ORAL_TABLET | Freq: Every day | ORAL | Status: DC
  Administered 2018-04-10: 1
  Filled 2018-04-10: qty 1

## 2018-04-10 MED ORDER — SENNOSIDES-DOCUSATE SODIUM 8.6-50 MG PO TABS
2.0000 | ORAL_TABLET | Freq: Two times a day (BID) | ORAL | Status: DC
  Administered 2018-04-10 – 2018-04-12 (×4): 2
  Filled 2018-04-10 (×5): qty 2

## 2018-04-10 MED ORDER — ADULT MULTIVITAMIN LIQUID CH: 15.0000 mL | Freq: Every day | ORAL | Status: DC

## 2018-04-10 MED ORDER — AMIODARONE HCL IN DEXTROSE 360-4.14 MG/200ML-% IV SOLN
60.0000 mg/h | INTRAVENOUS | Status: AC
  Administered 2018-04-10 (×2): 60 mg/h via INTRAVENOUS
  Filled 2018-04-10 (×2): qty 200

## 2018-04-10 MED ORDER — ADULT MULTIVITAMIN LIQUID CH
15.0000 mL | Freq: Every day | ORAL | Status: DC
  Administered 2018-04-11 – 2018-04-24 (×14): 15 mL
  Filled 2018-04-10 (×15): qty 15

## 2018-04-10 NOTE — Progress Notes (Signed)
Nutrition Follow-up  DOCUMENTATION CODES:   Morbid obesity  INTERVENTION:  Initiate new goal TF regimen of Glucerna 1.5 Cal at 20 mL/hr (480 mL goal daily volume) + Pro-Stat 60 mL 5 times daily via OGT. Provides 1720 kcal, 190 grams of protein, 365 mL H2O daily. With current propofol rate provides 2140 kcal daily.  Provide liquid MVI daily per tube as goal TF regimen does not meet 100% RDIs for vitamins/minerals.  NUTRITION DIAGNOSIS:   Inadequate oral intake related to inability to eat as evidenced by NPO status.  Ongoing - addressing with TF regimen.  GOAL:   Provide needs based on ASPEN/SCCM guidelines  Met with TF regimen.  MONITOR:   Vent status, Labs, Weight trends, Skin, TF tolerance, I & O's  REASON FOR ASSESSMENT:   Ventilator, Consult Enteral/tube feeding initiation and management  ASSESSMENT:   47 year old male with PMHx of A-fib s/p ablation and mechanical aortic valve replacement on warfarin, HTN, DM type 2, CHF, OSA, anxiety, CKD, HLD, pancreatitis who presented with lower back pain and sciatica. While in MRI on 5/31 suite patient suffered pulseless V. tach cardiac arrest with 5 minutes ACLS before ROSC obtained, and was intubated.   -ECHO 5/31 with LVEF 55-60%.  Patient intubated and sedated. In [facility] mode with FiO2 70% at time of RD assessment. Patient has had very high insulin requirements. Will try Glucerna to see if that improves insulin requirements at all.   Access: 18 Fr. OGT placed 5/31; per abdominal x-ray on 5/31 tip terminates in proximal stomach and side port is in distal esophagus so the recommendation was to advance at least 5 cm; 55 cm at corner of mouth; discussed with RN and plan is to have MD read x-ray  MAP: 62-113 mmHg  TF: pt was started on VHP at 20 mL/hr on early AM of 6/3 and is tolerating well  Patient is currently intubated on ventilator support Ve: 11.3 L/min Temp (24hrs), Avg:100 F (37.8 C), Min:99.2 F (37.3 C), Max:101.7  F (38.7 C)  Propofol: 15.9 ml/hr (420 kcal daily)  Medications reviewed and include: famotidine, Lasix 20 mg IV daily, MVI daily, senna-docusate, amiodarone gtt, fentanyl gtt, heparin gtt, regular insulin gtt at 26.9 mL/hr, Keppra, norepinephrine gtt at 3 mcg/min, propofol gtt, vancomycin.  Labs reviewed: CBG 184-282 past 24 hrs, Triglycerides 242, Chloride 99, BUN 34, Creatinine 2.43. Potassium, Phosphorus, and Magnesium WNL today.  I/O: 2965 mL UOP yesterday (0.7 mL/kg/hr)  Weight trend: 177 kg on 6/3; +9.2 kg from admission  Discussed with RN and on rounds.  Diet Order:   Diet Order           Diet NPO time specified  Diet effective now          EDUCATION NEEDS:   No education needs have been identified at this time  Skin:  Skin Assessment: Skin Integrity Issues:(open blister right foot; DM ulcers to bilateral ankles)  Last BM:  Unknown/PTA  Height:   Ht Readings from Last 1 Encounters:  04/07/18 _0  (1.778 m)    Weight:   Wt Readings from Last 1 Encounters:  04/10/18 (!) 390 lb 3.4 oz (177 kg)    Ideal Body Weight:  75.5 kg  BMI:  Body mass index is 55.99 kg/m.  Estimated Nutritional Needs:   Kcal:  4967-5916 (22-25 kcal/kg IBW)  Protein:  189 grams (2.5 grams/kg IBW)  Fluid:  1.8 L/day (25 mL/kg IBW)  Willey Blade, MS, RD, LDN Office: [phone] Pager: [phone] After  Hours/Weekend Pager: [phone]

## 2018-04-10 NOTE — Progress Notes (Signed)
Pharmacy Antibiotic Note  Alexander RiffleOtis Leon Alexander is a 47 y.o. male admitted on 04/07/2018 s/p cardiac arrest started on antibiotics for MRSA Pneumonia Pharmacy has been consulted for Vancomycin dosing.  Plan: Vancomycin random level 12. Will continue patient on vancomycin 1250mg  IV Q12hr for goal trough of 15-20. Patient is morbidly obese and with AKI will monitor serum creatinine and will obtain follow up vancomycin level prior to 3rd dose of regimen.   Height: 5\' 10"  (177.8 cm)(based on previous notes) Weight: (!) 390 lb 3.4 oz (177 kg) IBW/kg (Calculated) : 73  Temp (24hrs), Avg:99.8 F (37.7 C), Min:99.2 F (37.3 C), Max:100.8 F (38.2 C)  Recent Labs  Lab 04/07/18 0546  04/07/18 1233 04/07/18 1546  04/08/18 0544 04/08/18 1053 04/08/18 1754 04/09/18 0351 04/10/18 0421 04/10/18 1218  WBC 9.4  --   --   --   --   --   --   --  9.6 9.4  --   CREATININE 2.50*   < >  --   --    < > 2.31* 2.34* 2.33* 2.81* 2.43*  --   LATICACIDVEN  --   --  2.4* 2.9*  --   --   --   --   --   --   --   VANCORANDOM  --   --   --   --   --   --   --   --   --   --  12   < > = values in this interval not displayed.    Estimated Creatinine Clearance: 60.3 mL/min (A) (by C-G formula based on SCr of 2.43 mg/dL (H)).    Allergies  Allergen Reactions  . Lexapro [Escitalopram Oxalate] Swelling    Antimicrobials this admission: Zosyn 5/31 >> 6/3 Vancomycin 6/2 >> Unasyn 6/3 x 1.   Dose adjustments this admission: 6/3 Vancomycin transitioned to 1250mg  IV Q12hr.   Microbiology results: 5/31 MRSA PCR: neg 6/1 BCx: no growth < 24hrs  6/1 UCx: No growth  6/1 Resp Cx/Trach Aspirate: MRSA   Thank you for allowing pharmacy to be a part of this patient's care.  Yolanda BonineHannah Lifsey, PharmD Pharmacy Resident 04/10/2018 8:09 PM

## 2018-04-10 NOTE — Progress Notes (Signed)
[place] for heparin drip management/warfarin management  Indication: atrial fibrillation and aortic valve replacement   Allergies  Allergen Reactions  . Lexapro [Escitalopram Oxalate] Swelling    Patient Measurements: Height: 5' 10"  (177.8 cm)(based on previous notes) Weight: (!) 390 lb 3.4 oz (177 kg) IBW/kg (Calculated) : 73 Heparin Dosing Weight: 111kg  Vital Signs: Temp: 100 F (37.8 C) (06/03 0414) Temp Source: Oral (06/03 0414) BP: 90/69 (06/03 0400) Pulse Rate: 90 (06/03 0414)  Labs: Recent Labs    04/07/18 0546  04/07/18 1911 04/07/18 2218  04/08/18 0427 04/08/18 0544  04/08/18 1754 04/09/18 0351 04/09/18 1220 04/09/18 1845 04/10/18 0421  HGB 18.2*  --   --   --   --   --   --   --   --  15.9  --   --  15.7  HCT 54.7*  --   --   --   --   --   --   --   --  48.7  --   --  47.2  PLT 231  --   --   --   --   --   --   --   --  196  --   --  173  LABPROT 19.8*  --   --   --   --   --  20.5*  --   --  18.9*  --   --  16.5*  INR 1.70  --   --   --   --   --  1.77  --   --  1.60  --   --  1.34  HEPARINUNFRC  --   --   --   --    < >  --  0.36  --   --  0.23* 0.39 0.43 0.34  CREATININE 2.50*   < > 2.44* 2.48*  --  2.39* 2.31*   < > 2.33* 2.81*  --   --  2.43*  TROPONINI 0.03*   < > 0.17* 0.17*  --  0.16*  --   --   --   --   --   --   --    < > = values in this interval not displayed.    Estimated Creatinine Clearance: 60.3 mL/min (A) (by C-G formula based on SCr of 2.43 mg/dL (H)).   Medical History: Past Medical History:  Diagnosis Date  . Allergy   . Anxiety   . Arrhythmia   . Arthritis   . Atrial fibrillation ([place])   . CHF (congestive heart failure) ([place])   . CKD (chronic kidney disease)   . Diabetes mellitus without complication ([place])   . Diabetes mellitus, type II ([place])   . Heart failure ([place])   . Hyperlipidemia   . Hypertension   . Pancreatitis   . Sleep apnea     Medications:  Scheduled:  .  chlorhexidine gluconate (MEDLINE KIT)  15 mL Mouth Rinse BID  . famotidine  20 mg Per Tube BID  . feeding supplement (PRO-STAT SUGAR FREE 64)  30 mL Per Tube BID  . feeding supplement (VITAL HIGH PROTEIN)  1,000 mL Per Tube Q24H  . fentaNYL (SUBLIMAZE) injection  50 mcg Intravenous Once  . furosemide  20 mg Intravenous Daily  . mouth rinse  15 mL Mouth Rinse 10 times per day   Infusions:  . sodium chloride    . fentaNYL infusion INTRAVENOUS 220 mcg/hr (04/10/18 0342)  .  heparin 1,800 Units/hr (04/10/18 0349)  . insulin (NOVOLIN-R) infusion 10.8 Units/hr (04/10/18 0443)  . levETIRAcetam Stopped (04/09/18 2130)  . norepinephrine (LEVOPHED) Adult infusion 2.5 mcg/min (04/10/18 0350)  . piperacillin-tazobactam (ZOSYN)  IV Stopped (04/10/18 0145)  . propofol (DIPRIVAN) infusion 16 mcg/kg/min (04/10/18 0148)  . vancomycin Stopped (04/09/18 1930)    Assessment: Pharmacy consulted for warfarin and heparin drip management for 47 yo male admitted to the ICU s/p cardiac arrest. Patient has history significant for aortic valve replacement, atrial fibrillation and CHF. Patient currently requiring amiodarone infusion and blood pressure support from norepinephrine. Head CT is negative for infarction or hemorrhage.   Patient takes warfarin 42m daily as an outpatient for goal INR 2.5-3.5.   Heparin infusing at 1600 units/hr.   Goal of Therapy:  INR 2-3 Heparin level 0.3-0.7 units/ml Monitor platelets by anticoagulation protocol: Yes   Plan:  06/03 @ 0430 HL 0.34 therapeutic. Will continue current rate of 1800 units/hr and will recheck w/ am labs.  DTobie Lords PharmD, BCPS Clinical Pharmacist 04/10/2018 5:12 AM

## 2018-04-10 NOTE — Progress Notes (Signed)
Subjective: Patient remains intubated and sedated.  When sedation decreased although patient agitated, was able to follow commands.    Objective: Current vital signs: BP 98/67   Pulse 73   Temp 99.2 F (37.3 C) (Axillary)   Resp (!) 32   Ht 5' 10"  (1.778 m) Comment: based on previous notes  Wt (!) 177 kg (390 lb 3.4 oz)   SpO2 92%   BMI 55.99 kg/m  Vital signs in last 24 hours: Temp:  [99.2 F (37.3 C)-100.2 F (37.9 C)] 99.2 F (37.3 C) (06/03 1600) Pulse Rate:  [73-144] 73 (06/03 1830) Resp:  [0-53] 32 (06/03 1830) BP: (63-159)/(36-140) 98/67 (06/03 1830) SpO2:  [88 %-100 %] 92 % (06/03 1830) FiO2 (%):  [70 %-80 %] 80 % (06/03 1504) Weight:  [177 kg (390 lb 3.4 oz)] 177 kg (390 lb 3.4 oz) (06/03 0419)  Intake/Output from previous day: 06/02 0701 - 06/03 0700 In: 4061 [I.V.:2908.5; NG/GT:485; IV Piggyback:667.5] Out: 2965 [Urine:2965] Intake/Output this shift: Total I/O In: 1258.5 [I.V.:1258.5] Out: 800 [Urine:800] Nutritional status:  Diet Order           Diet NPO time specified  Diet effective now          Neurologic Exam: Mental Status: Patient does not respond to verbal stimuli.  Does not respond to deep sternal rub.  Does not follow commands.  No verbalizations noted.  Cranial Nerves: II: patient does not respond confrontation bilaterally, pupils right 2 mm, left 2 mm,and reactive bilaterally III,IV,VI: doll's response absent bilaterally. V,VII: corneal reflex present bilaterally  VIII: patient does not respond to verbal stimuli IX,X: gag reflex reduced, XI: trapezius strength unable to test bilaterally XII: tongue strength unable to test Motor: Extremities flaccid throughout.  No spontaneous movement noted.  No purposeful movements noted. Sensory: Does not respond to noxious stimuli in any extremity. Deep Tendon Reflexes:  1+ in the upper extremities and absent in the lower extremities Plantars: Mute bilaterally Cerebellar: Unable to  perform    Lab Results: Basic Metabolic Panel: Recent Labs  Lab 04/07/18 1219  04/08/18 0544 04/08/18 1053 04/08/18 1754 04/09/18 0351 04/09/18 1013 04/09/18 1220 04/09/18 1548 04/09/18 2132 04/10/18 0421 04/10/18 1638  NA 133*   < > 140 140 138 136  --   --   --   --  138  --   K 4.6   < > 3.7 3.5 3.1* 6.0* 4.3 4.4 3.9 4.0 4.1  --   CL 89*   < > 98* 101 100* 98*  --   --   --   --  99*  --   CO2 30   < > 29 30 29 28   --   --   --   --  29  --   GLUCOSE 475*   < > 209* 195* 129* 315*  --   --   --   --  216*  --   BUN 67*   < > 43* 38* 34* 36*  --   --   --   --  34*  --   CREATININE 2.71*   < > 2.31* 2.34* 2.33* 2.81*  --   --   --   --  2.43*  --   CALCIUM 8.1*   < > 8.2* 7.7* 7.5* 7.7*  --   --   --   --  8.2*  --   MG 1.9  --  1.8  --   --   --   --   --  1.6*  --  2.4 2.1  PHOS 3.3  --  2.1*  --   --   --   --   --  2.5  --  3.3 3.5   < > = values in this interval not displayed.    Liver Function Tests: Recent Labs  Lab 04/07/18 0546 04/08/18 0544  AST 67* 41  ALT 42 34  ALKPHOS 84 49  BILITOT 1.0 1.7*  PROT 8.6* 7.5  ALBUMIN 4.1 3.5   No results for input(s): LIPASE, AMYLASE in the last 168 hours. No results for input(s): AMMONIA in the last 168 hours.  CBC: Recent Labs  Lab 04/07/18 0546 04/09/18 0351 04/10/18 0421  WBC 9.4 9.6 9.4  NEUTROABS  --  4.9  --   HGB 18.2* 15.9 15.7  HCT 54.7* 48.7 47.2  MCV 86.4 85.6 84.4  PLT 231 196 173    Cardiac Enzymes: Recent Labs  Lab 04/07/18 1233 04/07/18 1546 04/07/18 1911 04/07/18 2218 04/08/18 0427  TROPONINI 0.15* 0.16* 0.17* 0.17* 0.16*    Lipid Panel: Recent Labs  Lab 04/07/18 1056 04/10/18 0941  TRIG 325* 242*    CBG: Recent Labs  Lab 04/10/18 1300 04/10/18 1408 04/10/18 1456 04/10/18 1626 04/10/18 1732  GLUCAP 239* 213* 219* 196* 213*    Microbiology: Results for orders placed or performed during the hospital encounter of 04/07/18  MRSA PCR Screening     Status: None    Collection Time: 04/07/18  9:49 AM  Result Value Ref Range Status   MRSA by PCR NEGATIVE NEGATIVE Final    Comment:        The GeneXpert MRSA Assay (FDA approved for NASAL specimens only), is one component of a comprehensive MRSA colonization surveillance program. It is not intended to diagnose MRSA infection nor to guide or monitor treatment for MRSA infections. Performed at [facility], [address] [place], [place] [address]   Culture, respiratory (NON-Expectorated)     Status: None   Collection Time: 04/08/18 10:53 AM  Result Value Ref Range Status   Specimen Description   Final    TRACHEAL ASPIRATE Performed at [facility], [address] [place], [place] [address]    Special Requests   Final    Normal Performed at [facility], [address] [place], [place] [address]    Gram Stain   Final    ABUNDANT WBC PRESENT, PREDOMINANTLY PMN NO SQUAMOUS EPITHELIAL CELLS SEEN MODERATE GRAM POSITIVE COCCI IN CLUSTERS FEW GRAM NEGATIVE COCCOBACILLI RARE GRAM POSITIVE RODS Performed at [facility], [address] [place], [place] [address]    Culture   Final    ABUNDANT METHICILLIN RESISTANT STAPHYLOCOCCUS AUREUS   Report Status 04/10/2018 FINAL  Final   Organism ID, Bacteria METHICILLIN RESISTANT STAPHYLOCOCCUS AUREUS  Final      Susceptibility   Methicillin resistant staphylococcus aureus - MIC*    CIPROFLOXACIN >=8 RESISTANT Resistant     ERYTHROMYCIN >=8 RESISTANT Resistant     GENTAMICIN <=0.5 SENSITIVE Sensitive     OXACILLIN >=4 RESISTANT Resistant     TETRACYCLINE <=1 SENSITIVE Sensitive     VANCOMYCIN 1 SENSITIVE Sensitive     TRIMETH/SULFA <=10 SENSITIVE Sensitive     CLINDAMYCIN <=0.25 SENSITIVE Sensitive     RIFAMPIN <=0.5 SENSITIVE Sensitive     Inducible Clindamycin NEGATIVE Sensitive     * ABUNDANT METHICILLIN RESISTANT STAPHYLOCOCCUS AUREUS  CULTURE, BLOOD (ROUTINE X 2) w Reflex to ID Panel  Status: None  (Preliminary result)   Collection Time: 04/08/18 11:43 AM  Result Value Ref Range Status   Specimen Description BLOOD LAC  Final   Special Requests   Final    BOTTLES DRAWN AEROBIC AND ANAEROBIC Blood Culture adequate volume   Culture   Final    NO GROWTH < 24 HOURS Performed at [facility], [address] [place], [place] [address]    Report Status PENDING  Incomplete  CULTURE, BLOOD (ROUTINE X 2) w Reflex to ID Panel     Status: None (Preliminary result)   Collection Time: 04/08/18 11:43 AM  Result Value Ref Range Status   Specimen Description BLOOD LT HAND  Final   Special Requests   Final    BOTTLES DRAWN AEROBIC AND ANAEROBIC Blood Culture adequate volume   Culture   Final    NO GROWTH < 24 HOURS Performed at [facility], [address] [place], [place] [address]    Report Status PENDING  Incomplete  Urine Culture     Status: None   Collection Time: 04/08/18  1:16 PM  Result Value Ref Range Status   Specimen Description   Final    URINE, CATHETERIZED Performed at [facility], [address] [place], [place] [address]    Special Requests   Final    Normal Performed at [facility], [address], [place], [place] [address]    Culture   Final    NO GROWTH Performed at [facility], [place] [address], [place], [place] [address]    Report Status 04/09/2018 FINAL  Final    Coagulation Studies: Recent Labs    04/08/18 0544 04/09/18 0351 04/10/18 0421  LABPROT 20.5* 18.9* 16.5*  INR 1.77 1.60 1.34    Imaging: Ct Head Wo Contrast  Result Date: 04/09/2018 CLINICAL DATA:  47 year old with acute mental status changes after cardiac arrest. Possible new onset seizures. EXAM: CT HEAD WITHOUT CONTRAST TECHNIQUE: Contiguous axial images were obtained from the base of the skull through the vertex without intravenous contrast. COMPARISON:  04/07/2018. FINDINGS: Brain: Ventricular system normal in size and appearance for age.  Physiologic calcifications in the basal ganglia bilaterally as noted previously. No mass lesion. No midline shift. No acute hemorrhage or hematoma. No extra-axial fluid collections. No evidence of acute infarction. No evidence of cerebral edema at this time. Vascular: Moderate BILATERAL carotid siphon and vertebrobasilar atherosclerosis. No hyperdense vessel. Skull: No skull fracture or other focal osseous abnormality involving the skull. Sinuses/Orbits: Visualized paranasal sinuses, bilateral mastoid air cells and bilateral middle ear cavities well-aerated. Visualized orbits and globes normal in appearance. Other: None. IMPRESSION: No acute intracranial abnormality. Electronically Signed   By: Evangeline Dakin M.D.   On: 04/09/2018 11:20   Dg Chest Port 1 View  Result Date: 04/10/2018 CLINICAL DATA:  Hypoxia EXAM: PORTABLE CHEST 1 VIEW COMPARISON:  April 09, 2018 FINDINGS: Endotracheal tube tip is 4.3 cm above the carina. Central catheter tip is at cavoatrial junction. Nasogastric tube tip and side port below the diaphragm. No pneumothorax. There is patchy airspace consolidation in both medial lung bases with small pleural effusions bilaterally. Lungs elsewhere clear. Heart is mildly enlarged with pulmonary vascularity normal. No adenopathy. Patient is status post median sternotomy. No bone lesions evident. IMPRESSION: Tube and catheter positions as described without pneumothorax. Suspect bibasilar pneumonia. Small pleural effusions. Stable cardiac prominence. Electronically Signed   By: Lowella Grip III M.D.   On: 04/10/2018 10:27   Dg  Chest Port 1 View  Result Date: 04/09/2018 CLINICAL DATA:  Pneumonia EXAM: PORTABLE CHEST 1 VIEW COMPARISON:  April 08, 2018 FINDINGS: The ETT is in good position. The distal NG tube terminates below today's film. Stable cardiomegaly. Bibasilar opacities may represent small effusions with underlying atelectasis, stable on the right and mildly increased on the left. The right  IJ is stable. No other interval changes. IMPRESSION: 1. Support apparatus as above. 2. Bibasilar opacities may represent small effusions and underlying atelectasis, stable on the right and mildly increased on the left in the interval. Electronically Signed   By: Dorise Bullion III M.D   On: 04/09/2018 07:14   Dg Abd Portable 1v  Result Date: 04/10/2018 CLINICAL DATA:  Status post OG tube placement. EXAM: PORTABLE ABDOMEN - 1 VIEW COMPARISON:  Single view of the abdomen 04/07/2018. FINDINGS: OG tube tip  and sideport are both in the stomach. IMPRESSION: OG tube in good position. Electronically Signed   By: Inge Rise M.D.   On: 04/10/2018 14:32    Medications:  I have reviewed the patient's current medications. Scheduled: . chlorhexidine gluconate (MEDLINE KIT)  15 mL Mouth Rinse BID  . famotidine  20 mg Per Tube BID  . feeding supplement (GLUCERNA 1.5 CAL)  1,000 mL Per Tube Q24H  . feeding supplement (PRO-STAT SUGAR FREE 64)  60 mL Per Tube 5 X Daily  . furosemide  20 mg Intravenous Daily  . mouth rinse  15 mL Mouth Rinse 10 times per day  . [START ON 04/11/2018] multivitamin  15 mL Per Tube Daily  . senna-docusate  2 tablet Per Tube BID    Assessment/Plan: Patient remains intubated and sedated but does show ability to follow commands.  Multiple medical issues remain that may be leading to altered mental status.  At this time while these are being addressed will continue to follow prn.     LOS: 3 days   Alexis Goodell, MD Neurology [phone] 04/10/2018  6:47 PM

## 2018-04-10 NOTE — Progress Notes (Signed)
[facility] - [place] at [facility]   PATIENT NAME: Alexander Alexander    MR#:  [id number]  DATE OF BIRTH:  [birth year]  SUBJECTIVE:   Patient intubated and on the ventilator.  FiO2 85% REVIEW OF SYSTEMS:   Review of Systems  Unable to perform ROS: Intubated   Tolerating Diet:Tube feeding Tolerating PT: intubated  DRUG ALLERGIES:   Allergies  Allergen Reactions  . Lexapro [Escitalopram Oxalate] Swelling    VITALS:  Blood pressure 115/76, pulse 94, temperature 99.3 F (37.4 C), temperature source Axillary, resp. rate (!) 40, height 5\' 10"  (1.778 m), weight (!) 177 kg (390 lb 3.4 oz), SpO2 97 %.  PHYSICAL EXAMINATION:   Physical Exam  GENERAL:  47 y.o.-year-old patient lying in the bed with no acute distress. Morbidly obese EYES: Pupils equal, round, reactive to light and accommodation. No scleral icterus.  HEENT: Head atraumatic, normocephalic. Oropharynx and nasopharynx clear. Intubated and on the vent NECK:  Supple, no jugular venous distention. No thyroid enlargement, no tenderness.  LUNGS: Normal breath sounds bilaterally, no wheezing, rales, rhonchi. No use of accessory muscles of respiration.  CARDIOVASCULAR: S1, S2 normal. No murmurs, rubs, or gallops.  ABDOMEN: Soft, nontender, nondistended. Bowel sounds present. No organomegaly or mass.  EXTREMITIES: No cyanosis, clubbing or edema b/l.    NEUROLOGIC: unable to assess --pt intubated PSYCHIATRIC: intubated  SKIN: No obvious rash, lesion, or ulcer.   LABORATORY PANEL:  CBC Recent Labs  Lab 04/10/18 0421  WBC 9.4  HGB 15.7  HCT 47.2  PLT 173    Chemistries  Recent Labs  Lab 04/08/18 0544  04/10/18 0421  NA 140   < > 138  K 3.7   < > 4.1  CL 98*   < > 99*  CO2 29   < > 29  GLUCOSE 209*   < > 216*  BUN 43*   < > 34*  CREATININE 2.31*   < > 2.43*  CALCIUM 8.2*   < > 8.2*  MG 1.8   < > 2.4  AST 41  --   --   ALT 34  --   --   ALKPHOS 49  --   --   BILITOT 1.7*  --   --    < > =  values in this interval not displayed.   Cardiac Enzymes Recent Labs  Lab 04/08/18 0427  TROPONINI 0.16*   RADIOLOGY:  Ct Head Wo Contrast  Result Date: 04/09/2018 CLINICAL DATA:  47 year old with acute mental status changes after cardiac arrest. Possible new onset seizures. EXAM: CT HEAD WITHOUT CONTRAST TECHNIQUE: Contiguous axial images were obtained from the base of the skull through the vertex without intravenous contrast. COMPARISON:  04/07/2018. FINDINGS: Brain: Ventricular system normal in size and appearance for age. Physiologic calcifications in the basal ganglia bilaterally as noted previously. No mass lesion. No midline shift. No acute hemorrhage or hematoma. No extra-axial fluid collections. No evidence of acute infarction. No evidence of cerebral edema at this time. Vascular: Moderate BILATERAL carotid siphon and vertebrobasilar atherosclerosis. No hyperdense vessel. Skull: No skull fracture or other focal osseous abnormality involving the skull. Sinuses/Orbits: Visualized paranasal sinuses, bilateral mastoid air cells and bilateral middle ear cavities well-aerated. Visualized orbits and globes normal in appearance. Other: None. IMPRESSION: No acute intracranial abnormality. Electronically Signed   By: Hulan Saas M.D.   On: 04/09/2018 11:20   Dg Chest Port 1 View  Result Date: 04/10/2018 CLINICAL DATA:  Hypoxia EXAM: PORTABLE CHEST 1  VIEW COMPARISON:  April 09, 2018 FINDINGS: Endotracheal tube tip is 4.3 cm above the carina. Central catheter tip is at cavoatrial junction. Nasogastric tube tip and side port below the diaphragm. No pneumothorax. There is patchy airspace consolidation in both medial lung bases with small pleural effusions bilaterally. Lungs elsewhere clear. Heart is mildly enlarged with pulmonary vascularity normal. No adenopathy. Patient is status post median sternotomy. No bone lesions evident. IMPRESSION: Tube and catheter positions as described without pneumothorax.  Suspect bibasilar pneumonia. Small pleural effusions. Stable cardiac prominence. Electronically Signed   By: Bretta BangWilliam  Woodruff III M.D.   On: 04/10/2018 10:27   Dg Chest Port 1 View  Result Date: 04/09/2018 CLINICAL DATA:  Pneumonia EXAM: PORTABLE CHEST 1 VIEW COMPARISON:  April 08, 2018 FINDINGS: The ETT is in good position. The distal NG tube terminates below today's film. Stable cardiomegaly. Bibasilar opacities may represent small effusions with underlying atelectasis, stable on the right and mildly increased on the left. The right IJ is stable. No other interval changes. IMPRESSION: 1. Support apparatus as above. 2. Bibasilar opacities may represent small effusions and underlying atelectasis, stable on the right and mildly increased on the left in the interval. Electronically Signed   By: Gerome Samavid  Williams III M.D   On: 04/09/2018 07:14   Dg Abd Portable 1v  Result Date: 04/10/2018 CLINICAL DATA:  Status post OG tube placement. EXAM: PORTABLE ABDOMEN - 1 VIEW COMPARISON:  Single view of the abdomen 04/07/2018. FINDINGS: OG tube tip  and sideport are both in the stomach. IMPRESSION: OG tube in good position. Electronically Signed   By: Drusilla Kannerhomas  Dalessio M.D.   On: 04/10/2018 14:32   ASSESSMENT AND PLAN:  Alexander Alexander  is a 47 y.o. male who presents with complaint of low back pain.  Patient is a known history of sciatica, medicated for similar complaint of low back pain with radiation down his legs.  Initially he told ED physician and the pain was much worse than it has been for him, and so MRI was ordered.  While patient was in radiology department waiting to start MRI he had a cardiac arrest.   *Acute respiratory failure with hypoxemia initially thought to be from cardiac arrest [facility](HCC) - - He was intubated during his code event, and is being sent to the ICU for further treatment.    follow with intensivist currently FiO2-85% vent management by intensivist   *Pulseless electric activity after back pain  possibly consistent with a respiratory event rather than a cardiac event per cardiology -Patient is hemodynamically stable -Echocardiogram with 55 to 60% ejection fraction -cardiology  following discussed with Dr. Gwen PoundsKowalski  *  Chronic combined systolic (congestive) and diastolic (congestive) heart failure (HCC) -also potentially causing some arrhythmias, including his ventricular rhythm which may have been responsible for his cardiac arrest.  He had significant runs of V. tach in the ED postarrest.  -Patient is off IV amiodarone.  *Coagulopathy with Warfarin and subtherapeutic INR. S/p Mechanical aortic valve replacement.  Currently on heparin drip  * AKI on  CKD (chronic kidney disease) stage 3, with hyperkalemia  Was volume, avoid nephrotoxins and monitor urine output, will consider nephrology consult if needed  *Elevated LFTs-probably acute phase reactants monitor closely  *HTN (hypertension) -hold home dose antihypertensives for now as the antiarrhythmics and his code event have dropped his blood pressure.  Patient is currently on IV pressors  *  T2DM (type 2 diabetes mellitus) (HCC) -sliding scale insulin with corresponding glucose checks  *  Atrial fibrillation (HCC) -continue rate controlling medications as well as anticoagulation with IV heparin, consider changing back over to warfarin with INR goal between 2-3  *  Aortic valve replaced -continue anticoagulation with IV heparin and consider changing back over to warfarin with INR goal between 2-3  *  OSA (obstructive sleep apnea) -currently intubated, once he is extubated he will need CPAP nightly   *  Hyperlipidemia associated with type 2 diabetes mellitus (HCC) -continue home dose antilipid  Spoke with mom and wife  Case discussed with Care Management/Social Worker. Management plans discussed with the patient, wife at bedside and they are in agreement.  CODE STATUS: full  DVT Prophylaxis: heparin gtt  TOTAL TIME  TAKING CARE OF THIS PATIENT: 33 minutes.  >50% time spent on counselling and coordination of care  POSSIBLE D/C IN few DAYS, DEPENDING ON CLINICAL CONDITION.  Note: This dictation was prepared with Dragon dictation along with smaller phrase technology. Any transcriptional errors that result from this process are unintentional.  Ramonita Lab M.D on 04/10/2018 at 4:35 PM  Between 7am to 6pm - Pager - [phone]  After 6pm go to [email or web address] - Social research officer, government  [facility]  Office  [phone]  CC: Primary care physician; Erasmo Downer, MDPatient ID: [name], male   DOB: Oct 20, 1971, 47 y.o.   MRN: [id number]

## 2018-04-10 NOTE — Progress Notes (Signed)
Follow up - Critical Care Medicine Note  Patient Details:    Alexander Alexander is an 47 y.o. male. 47 year old gentleman with past medical history of atrial fibrillation, status post ablation and aortic valve replacement on warfarin, history of congestive heart failure, chronic renal disease, morbid obesity, diabetes, hyperlipidemia, obstructive sleep apnea and hypertension, suffered a pulseless V. Tach cardiac arrest in MRI when he was being evaluated for back pain and sciatica, subsequently intubated in the intensive care unit.  Lines, Airways, Drains: Airway 7.5 mm (Active)  Secured at (cm) 24 cm 04/10/2018  4:16 AM  Measured From Lips 04/10/2018  4:16 AM  Secured Location Right 04/10/2018  4:16 AM  Secured By [employer] 04/10/2018  4:16 AM  Tube Holder Repositioned Yes 04/10/2018  2:21 AM  Cuff Pressure (cm H2O) 26 cm H2O 04/09/2018  6:11 PM  Site Condition Dry 04/10/2018  4:16 AM     CVC Triple Lumen 04/07/18 Right External jugular (Active)  Indication for Insertion or Continuance of Line Vasoactive infusions;Prolonged intravenous therapies 04/09/2018  8:00 PM  Site Assessment Clean;Dry;Intact 04/09/2018  8:00 PM  Proximal Lumen Status Infusing;Flushed 04/09/2018  8:00 PM  Medial Lumen Status Infusing;Flushed 04/09/2018  8:00 PM  Distal Lumen Status Infusing;Flushed 04/09/2018  8:00 PM  Dressing Type Transparent 04/09/2018  8:00 PM  Dressing Status Clean;Dry;Intact;Antimicrobial disc in place 04/09/2018  8:00 PM  Line Care Connections checked and tightened 04/09/2018  8:00 PM  Dressing Intervention Dressing changed;Antimicrobial disc changed 04/10/2018  4:31 AM  Dressing Change Due 04/17/18 04/10/2018  4:31 AM     NG/OG Tube Orogastric 18 Fr. Right mouth Aucultation 55 cm (Active)  Cm Marking at Nare/Corner of Mouth (if applicable) 55 cm 04/10/2018  4:16 AM  Site Assessment Clean;Dry;Intact 04/10/2018  4:16 AM  Ongoing Placement Verification No change in cm markings or external length of tube from initial  placement;No change in respiratory status;No acute changes, not attributed to clinical condition 04/10/2018  4:16 AM  Status Infusing tube feed 04/10/2018  4:16 AM  Intake (mL) 30 mL 04/08/2018  5:00 PM     Urethral Catheter Myra Temperature probe 16 Fr. (Active)  Indication for Insertion or Continuance of Catheter Other (comment) 04/10/2018  4:16 AM  Site Assessment Clean;Intact;Dry 04/10/2018  4:16 AM  Catheter Maintenance Bag below level of bladder;Catheter secured;Drainage bag/tubing not touching floor;Seal intact;No dependent loops;Insertion date on drainage bag;Bag emptied prior to transport 04/10/2018  4:16 AM  Collection Container Standard drainage bag 04/10/2018  4:16 AM  Securement Method Securing device (Describe) 04/10/2018  4:16 AM  Urinary Catheter Interventions Unclamped 04/10/2018  4:16 AM  Output (mL) 75 mL 04/10/2018  5:41 AM    Anti-infectives:  Anti-infectives (From admission, onward)   Start     Dose/Rate Route Frequency Ordered Stop   04/09/18 1800  vancomycin (VANCOCIN) 1,500 mg in sodium chloride 0.9 % 500 mL IVPB     1,500 mg 250 mL/hr over 120 Minutes Intravenous Every 18 hours 04/09/18 1303     04/09/18 1045  vancomycin (VANCOCIN) IVPB 1000 mg/200 mL premix     1,000 mg 200 mL/hr over 60 Minutes Intravenous  Once 04/09/18 1041 04/09/18 1307   04/07/18 1400  piperacillin-tazobactam (ZOSYN) IVPB 3.375 g     3.375 g 12.5 mL/hr over 240 Minutes Intravenous Every 8 hours 04/07/18 1015        Microbiology: Results for orders placed or performed during the hospital encounter of 04/07/18  MRSA PCR Screening     Status:  None   Collection Time: 04/07/18  9:49 AM  Result Value Ref Range Status   MRSA by PCR NEGATIVE NEGATIVE Final    Comment:        The GeneXpert MRSA Assay (FDA approved for NASAL specimens only), is one component of a comprehensive MRSA colonization surveillance program. It is not intended to diagnose MRSA infection nor to guide or monitor treatment for MRSA  infections. Performed at [facility], [address] [place], [place] [address]   Culture, respiratory (NON-Expectorated)     Status: None (Preliminary result)   Collection Time: 04/08/18 10:53 AM  Result Value Ref Range Status   Specimen Description   Final    TRACHEAL ASPIRATE Performed at [facility], [address], [place], [place] [address]    Special Requests   Final    Normal Performed at [facility], [address] [place], [place] [address]    Gram Stain   Final    ABUNDANT WBC PRESENT, PREDOMINANTLY PMN NO SQUAMOUS EPITHELIAL CELLS SEEN MODERATE GRAM POSITIVE COCCI IN CLUSTERS FEW GRAM NEGATIVE COCCOBACILLI RARE GRAM POSITIVE RODS Performed at [facility], [address], [place], [place] [address]    Culture PENDING  Incomplete   Report Status PENDING  Incomplete  CULTURE, BLOOD (ROUTINE X 2) w Reflex to ID Panel     Status: None (Preliminary result)   Collection Time: 04/08/18 11:43 AM  Result Value Ref Range Status   Specimen Description BLOOD LAC  Final   Special Requests   Final    BOTTLES DRAWN AEROBIC AND ANAEROBIC Blood Culture adequate volume   Culture   Final    NO GROWTH < 24 HOURS Performed at [facility], [address], [place], [place] [address]    Report Status PENDING  Incomplete  CULTURE, BLOOD (ROUTINE X 2) w Reflex to ID Panel     Status: None (Preliminary result)   Collection Time: 04/08/18 11:43 AM  Result Value Ref Range Status   Specimen Description BLOOD LT HAND  Final   Special Requests   Final    BOTTLES DRAWN AEROBIC AND ANAEROBIC Blood Culture adequate volume   Culture   Final    NO GROWTH < 24 HOURS Performed at [facility], [address], [place], [place] [address]    Report Status PENDING  Incomplete  Urine Culture     Status: None   Collection Time: 04/08/18  1:16 PM  Result Value Ref Range Status   Specimen Description   Final    URINE,  CATHETERIZED Performed at [facility], [address], [place], [place] [address]    Special Requests   Final    Normal Performed at [facility] [place] [facility], [address], [place], [place] [address]    Culture   Final    NO GROWTH Performed at [facility], [address] [place], [place] [address]    Report Status 04/09/2018 FINAL  Final   Events: No significant change over last 24 hours. Patient remains on ventilator, heparin drip, insulin, amiodarone, norepinephrine along with propofol.temperature is noted 2 episodes febrile. Patient presently on vancomycin and Zosyn  Studies: Dg Chest 1 View  Result Date: 04/07/2018 CLINICAL DATA:  47 y/o  M; intubated. EXAM: CHEST  1 VIEW COMPARISON:  12/06/2017 chest radiograph FINDINGS: Stable cardiomegaly given projection and technique. Transcutaneous pacing pads. Post median sternotomy with wires in alignment. Low lung volumes. Pulmonary vascular congestion. No focal consolidation.  Endotracheal tube tip 2.4 cm above the carina. Enteric tube tip appears to project over the lower esophagus, advancement recommended. IMPRESSION: 1. Endotracheal tube tip 2.4 cm above the carina. 2. Cardiomegaly and pulmonary vascular congestion. 3. Enteric tube tip appears to project over the lower esophagus, advancement recommended. Electronically Signed   By: Mitzi Hansen M.D.   On: 04/07/2018 06:22   Ct Head Wo Contrast  Result Date: 04/09/2018 CLINICAL DATA:  47 year old with acute mental status changes after cardiac arrest. Possible new onset seizures. EXAM: CT HEAD WITHOUT CONTRAST TECHNIQUE: Contiguous axial images were obtained from the base of the skull through the vertex without intravenous contrast. COMPARISON:  04/07/2018. FINDINGS: Brain: Ventricular system normal in size and appearance for age. Physiologic calcifications in the basal ganglia bilaterally as noted previously. No mass lesion. No midline shift. No acute hemorrhage or  hematoma. No extra-axial fluid collections. No evidence of acute infarction. No evidence of cerebral edema at this time. Vascular: Moderate BILATERAL carotid siphon and vertebrobasilar atherosclerosis. No hyperdense vessel. Skull: No skull fracture or other focal osseous abnormality involving the skull. Sinuses/Orbits: Visualized paranasal sinuses, bilateral mastoid air cells and bilateral middle ear cavities well-aerated. Visualized orbits and globes normal in appearance. Other: None. IMPRESSION: No acute intracranial abnormality. Electronically Signed   By: Hulan Saas M.D.   On: 04/09/2018 11:20   Ct Head Wo Contrast  Result Date: 04/07/2018 CLINICAL DATA:  Cardiac arrest EXAM: CT HEAD WITHOUT CONTRAST TECHNIQUE: Contiguous axial images were obtained from the base of the skull through the vertex without intravenous contrast. COMPARISON:  None. FINDINGS: Brain: No evidence of acute infarction, hemorrhage, hydrocephalus, extra-axial collection or mass lesion/mass effect. Vascular: No hyperdense vessel or unexpected calcification. Skull: Negative Sinuses/Orbits: Negative Other: None IMPRESSION: Negative CT head Electronically Signed   By: Marlan Palau M.D.   On: 04/07/2018 15:25   Dg Chest Port 1 View  Result Date: 04/09/2018 CLINICAL DATA:  Pneumonia EXAM: PORTABLE CHEST 1 VIEW COMPARISON:  April 08, 2018 FINDINGS: The ETT is in good position. The distal NG tube terminates below today's film. Stable cardiomegaly. Bibasilar opacities may represent small effusions with underlying atelectasis, stable on the right and mildly increased on the left. The right IJ is stable. No other interval changes. IMPRESSION: 1. Support apparatus as above. 2. Bibasilar opacities may represent small effusions and underlying atelectasis, stable on the right and mildly increased on the left in the interval. Electronically Signed   By: Gerome Sam III M.D   On: 04/09/2018 07:14   Dg Chest Port 1 View  Result Date:  04/08/2018 CLINICAL DATA:  Followup pneumonia. EXAM: PORTABLE CHEST 1 VIEW COMPARISON:  04/07/2018 and older exams. FINDINGS: Lung volumes are low. This accentuates the appearance of vascular congestion and interstitial prominence. There is mild linear atelectasis in the right mid lung, stable from the prior exam. Bilateral lung base opacity is most likely atelectasis. Pneumonia is possible. No overt pulmonary edema. Is Stable changes from prior cardiac surgery. Cardiac silhouette is mildly enlarged. Endotracheal tube, right internal jugular central venous line and nasal/orogastric tube are stable. IMPRESSION: 1. No significant change from the previous day's study. 2. Vascular congestion with interstitial prominence, but without overt pulmonary edema. 3. Basilar atelectasis.  Cannot exclude pneumonia. Electronically Signed   By: Amie Portland M.D.   On: 04/08/2018 07:23   Dg Chest Portable 1 View  Result Date: 04/07/2018 CLINICAL DATA:  Central catheter placement.  Hypoxia. EXAM: PORTABLE CHEST 1 VIEW COMPARISON:  Apr 07, 2018  study obtained earlier in the day FINDINGS: Right jugular catheter tip is in the superior vena cava near the cavoatrial junction. Endotracheal tube tip is 4.0 cm above the carina. Nasogastric tube tip and side port are below the diaphragm. No pneumothorax. There is bibasilar atelectasis. Lungs elsewhere are clear. 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 No adenopathy. No bone lesions. Patient is status post median sternotomy with aortic valve replacement. IMPRESSION: Tube and catheter positions as described without pneumothorax. There is pulmonary vascular congestion. Bibasilar atelectasis present. No consolidation evident. Electronically Signed   By: Bretta BangWilliam  Woodruff III M.D.   On: 04/07/2018 08:51   Dg Abd Portable 1v  Result Date: 04/07/2018 CLINICAL DATA:  NG placement EXAM: PORTABLE ABDOMEN - 1 VIEW COMPARISON:  None. FINDINGS: Limited exam due to large patient  size and difficulty with positioning. NG tip in the proximal stomach. NG side hole in the distal esophagus. No dilated bowel loops. IMPRESSION: NG tube in the proximal stomach with the side hole in the distal esophagus. Recommend advancing at least 5 cm. Electronically Signed   By: Marlan Palauharles  Clark M.D.   On: 04/07/2018 10:48    Consults: Treatment Team:  Lamar BlinksKowalski, Bruce J, MD Uvaldo RisingSamaan, Maged, MD Anson FretAhern, Antonia B, MD   Objective:  Vital signs for last 24 hours: Temp:  [99.2 F (37.3 C)-101.8 F (38.8 C)] 100 F (37.8 C) (06/03 0414) Pulse Rate:  [73-118] 87 (06/03 0600) Resp:  [13-26] 19 (06/03 0600) BP: (63-151)/(36-129) 99/55 (06/03 0600) SpO2:  [92 %-100 %] 92 % (06/03 0600) FiO2 (%):  [60 %-70 %] 70 % (06/03 0400) Weight:  [390 lb 3.4 oz (177 kg)] 390 lb 3.4 oz (177 kg) (06/03 0419)  Hemodynamic parameters for last 24 hours:    Intake/Output from previous day: 06/02 0701 - 06/03 0700 In: 3978.6 [I.V.:2826.1; NG/GT:485; IV Piggyback:667.5] Out: 2965 [Urine:2965]  Intake/Output this shift: No intake/output data recorded.  Vent settings for last 24 hours: Vent Mode: PRVC FiO2 (%):  [60 %-70 %] 70 % Set Rate:  [20 bmp] 20 bmp Vt Set:  [550 mL] 550 mL PEEP:  [5 cmH20] 5 cmH20 Pressure Support:  [20 cmH20] 20 cmH20 Plateau Pressure:  [22 cmH20] 22 cmH20  Physical Exam:  Patient is arousable but somnolent. Presently on propofol HEENT: Patient is orally intubated, central line noted, trachea is midline, no accessory muscle utilization Cardiovascular: Atrial fibrillation noted on monitor, irregularly irregular rhythm with ventricular response averaging in the low 100s Pulmonary: Basilar crackles noted Abdominal: Distended abdomen, bowel sounds noted Extremity: 1+ edema noted Neurologic: Patient moves all extremities Cutaneous: No rashes or lesions noted  Assessment/Plan:    Acute respiratory failurewith hypoxemia status post cardiac arrest. Will decrease sedation, will  perform spontaneous awakening and breathing trial as tolerated.Patient empirically started on Zosyn and vancomycin,cultures thus far are negative, MRSA PCR is negative, will stop vancomycin and Zosyn changed to Unasyn  Pulseless V. tach cardiac arrest. ECHO LVEF 55-60%.Cardiology arrest was secondary to respiratory issues no need to  will follow with cardiology recommendations.  Altered mental status. appreciate neurology input. CT scan of the head was negative, no evidence of anoxic brain injury, presently empirically started on Keppra for possible partial seizure  AKI on CKDworsening with hyperkalemia -Optimize volume, avoid nephrotoxins, monitor renal panel and urine out put.  Hyperglycemia. Glycemic control.  Elevated LFT(improved)with possible acute ischemic hepatitis s/p cardiac arrest Monitor LFT   Coagulopathy with Warfarin and subtherapeutic INR. S/p Mechanical aortic valve replacement.  Critical Care Total Time 40 minutes  Uriyah Raska 04/10/2018  *Care during the described time interval was provided by me and/or other providers on the critical care team.  I have reviewed this patient's available data, including medical history, events of note, physical examination and test results as part of my evaluation.

## 2018-04-10 NOTE — Progress Notes (Signed)
Inpatient Diabetes Program Recommendations  AACE/ADA: New Consensus Statement on Inpatient Glycemic Control (2015)  Target Ranges:  Prepandial:   less than 140 mg/dL      Peak postprandial:   less than 180 mg/dL (1-2 hours)      Critically ill patients:  140 - 180 mg/dL   Lab Results  Component Value Date   GLUCAP 234 (H) 04/10/2018   HGBA1C 11.7 (H) 03/29/2018    Review of Glycemic ControlResults for Alexander RiffleWILSON, Alexander Alexander (MRN [id number]) as of 04/10/2018 08:53  Ref. Range 04/10/2018 04:42 04/10/2018 05:41 04/10/2018 06:37 04/10/2018 07:42 04/10/2018 08:48  Glucose-Capillary Latest Ref Range: 65 - 99 mg/dL 409214 (H) 811221 (H) 914282 (H) 241 (H) 234 (H)    Diabetes history: Type 2 DM  Outpatient Diabetes medications:  Amaryl 2 mg daily with breakfast, Levemir 65 units bid, Novolog 35 units tid with meals Current orders for Inpatient glycemic control:  IV insulin-Phase 2 ICU glycemic control protocol  Inpatient Diabetes Program Recommendations:    Note that insulin drip rates are high >20 units/hr. Hopefully blood sugars will improve today with continued IV insulin.  Will follow.   Thanks,  Beryl MeagerJenny Amamda Curbow, RN, BC-ADM Inpatient Diabetes Coordinator Pager [phone] (8a-5p)

## 2018-04-10 NOTE — Progress Notes (Signed)
Pharmacy Electrolyte Monitoring Consult:  Pharmacy consulted to assist in monitoring and replacing electrolytes in this 47 y.o. male admitted on 04/07/2018 s/p cardiac arrest and requiring mechanical ventilation.   Patient currently ordered insulin drip.   Labs:  Sodium (mmol/L)  Date Value  04/10/2018 138  03/29/2018 142   Potassium (mmol/L)  Date Value  04/10/2018 4.1   Magnesium (mg/dL)  Date Value  16/10/960406/01/2018 2.1   Phosphorus (mg/dL)  Date Value  54/09/811906/01/2018 3.5   Calcium (mg/dL)  Date Value  14/78/295606/01/2018 8.2 (L)   Albumin (g/dL)  Date Value  21/30/865706/11/2017 3.5  12/09/2017 4.2    Assessment/Plan: No further replacement warranted at this time. Will recheck all electrolytes with am labs.   Pharmacy will continue to monitor and adjust per consult.   Jasan Doughtie L,  04/10/2018 8:08 PM

## 2018-04-11 LAB — GLUCOSE, CAPILLARY
GLUCOSE-CAPILLARY: 110 mg/dL — AB (ref 65–99)
GLUCOSE-CAPILLARY: 123 mg/dL — AB (ref 65–99)
GLUCOSE-CAPILLARY: 124 mg/dL — AB (ref 65–99)
GLUCOSE-CAPILLARY: 126 mg/dL — AB (ref 65–99)
GLUCOSE-CAPILLARY: 128 mg/dL — AB (ref 65–99)
GLUCOSE-CAPILLARY: 210 mg/dL — AB (ref 65–99)
GLUCOSE-CAPILLARY: 272 mg/dL — AB (ref 65–99)
Glucose-Capillary: 121 mg/dL — ABNORMAL HIGH (ref 65–99)
Glucose-Capillary: 121 mg/dL — ABNORMAL HIGH (ref 65–99)
Glucose-Capillary: 130 mg/dL — ABNORMAL HIGH (ref 65–99)
Glucose-Capillary: 180 mg/dL — ABNORMAL HIGH (ref 65–99)
Glucose-Capillary: 203 mg/dL — ABNORMAL HIGH (ref 65–99)

## 2018-04-11 LAB — BASIC METABOLIC PANEL
ANION GAP: 11 (ref 5–15)
BUN: 38 mg/dL — ABNORMAL HIGH (ref 6–20)
CO2: 28 mmol/L (ref 22–32)
Calcium: 8.3 mg/dL — ABNORMAL LOW (ref 8.9–10.3)
Chloride: 100 mmol/L — ABNORMAL LOW (ref 101–111)
Creatinine, Ser: 2.45 mg/dL — ABNORMAL HIGH (ref 0.61–1.24)
GFR, EST AFRICAN AMERICAN: 34 mL/min — AB (ref >60)
GFR, EST NON AFRICAN AMERICAN: 30 mL/min — AB (ref >60)
Glucose, Bld: 121 mg/dL — ABNORMAL HIGH (ref 65–99)
POTASSIUM: 4 mmol/L (ref 3.5–5.1)
Sodium: 139 mmol/L (ref 135–145)

## 2018-04-11 LAB — PROTIME-INR
INR: 1.19
PROTHROMBIN TIME: 15 s (ref 11.4–15.2)

## 2018-04-11 LAB — PHOSPHORUS: PHOSPHORUS: 4 mg/dL (ref 2.5–4.6)

## 2018-04-11 LAB — MAGNESIUM: MAGNESIUM: 2.2 mg/dL (ref 1.7–2.4)

## 2018-04-11 LAB — HEPARIN LEVEL (UNFRACTIONATED): HEPARIN UNFRACTIONATED: 0.44 [IU]/mL (ref 0.30–0.70)

## 2018-04-11 LAB — VANCOMYCIN, RANDOM: VANCOMYCIN RM: 23

## 2018-04-11 MED ORDER — VANCOMYCIN HCL 10 G IV SOLR
1250.0000 mg | INTRAVENOUS | Status: DC
  Administered 2018-04-11 – 2018-04-14 (×4): 1250 mg via INTRAVENOUS
  Filled 2018-04-11 (×5): qty 1250

## 2018-04-11 MED ORDER — INSULIN REGULAR HUMAN 100 UNIT/ML IJ SOLN
INTRAMUSCULAR | Status: DC
  Administered 2018-04-12: 2.3 [IU]/h via INTRAVENOUS
  Administered 2018-04-12: 19.7 [IU]/h via INTRAVENOUS
  Administered 2018-04-12: 26.5 [IU]/h via INTRAVENOUS
  Administered 2018-04-12: 17.4 [IU]/h via INTRAVENOUS
  Administered 2018-04-13: 27.1 [IU]/h via INTRAVENOUS
  Administered 2018-04-13: 26.1 [IU]/h via INTRAVENOUS
  Administered 2018-04-13: 24.4 [IU]/h via INTRAVENOUS
  Administered 2018-04-13: 18.8 [IU]/h via INTRAVENOUS
  Administered 2018-04-13: 26 [IU]/h via INTRAVENOUS
  Administered 2018-04-14: 24.3 [IU]/h via INTRAVENOUS
  Administered 2018-04-14: 23.1 [IU]/h via INTRAVENOUS
  Filled 2018-04-11 (×13): qty 1

## 2018-04-11 MED ORDER — METOPROLOL TARTRATE 25 MG PO TABS
12.5000 mg | ORAL_TABLET | Freq: Two times a day (BID) | ORAL | Status: DC
  Administered 2018-04-12 (×2): 12.5 mg via ORAL
  Filled 2018-04-11 (×3): qty 1

## 2018-04-11 MED ORDER — FUROSEMIDE 10 MG/ML IJ SOLN
40.0000 mg | Freq: Once | INTRAMUSCULAR | Status: AC
  Administered 2018-04-11: 40 mg via INTRAVENOUS

## 2018-04-11 MED ORDER — INSULIN DETEMIR 100 UNIT/ML ~~LOC~~ SOLN
20.0000 [IU] | Freq: Two times a day (BID) | SUBCUTANEOUS | Status: DC
  Filled 2018-04-11: qty 0.2

## 2018-04-11 MED ORDER — POTASSIUM CHLORIDE 20 MEQ PO PACK
40.0000 meq | PACK | Freq: Once | ORAL | Status: AC
  Administered 2018-04-11: 40 meq via ORAL
  Filled 2018-04-11: qty 2

## 2018-04-11 MED ORDER — INSULIN ASPART 100 UNIT/ML ~~LOC~~ SOLN
3.0000 [IU] | SUBCUTANEOUS | Status: DC
  Administered 2018-04-11: 6 [IU] via SUBCUTANEOUS
  Administered 2018-04-11: 9 [IU] via SUBCUTANEOUS
  Administered 2018-04-11: 3 [IU] via SUBCUTANEOUS
  Administered 2018-04-11: 9 [IU] via SUBCUTANEOUS
  Filled 2018-04-11 (×3): qty 1

## 2018-04-11 MED ORDER — INSULIN DETEMIR 100 UNIT/ML ~~LOC~~ SOLN
20.0000 [IU] | Freq: Two times a day (BID) | SUBCUTANEOUS | Status: DC
  Administered 2018-04-11: 20 [IU] via SUBCUTANEOUS
  Filled 2018-04-11 (×3): qty 0.2

## 2018-04-11 MED ORDER — FUROSEMIDE 10 MG/ML IJ SOLN
60.0000 mg | Freq: Once | INTRAMUSCULAR | Status: AC
  Administered 2018-04-11: 60 mg via INTRAVENOUS

## 2018-04-11 NOTE — Progress Notes (Signed)
[place] for heparin drip management Indication: atrial fibrillation and aortic valve replacement   Allergies  Allergen Reactions  . Lexapro [Escitalopram Oxalate] Swelling    Patient Measurements: Height: _0  (177.8 cm)(based on previous notes) Weight: (!) 386 lb 7.5 oz (175.3 kg) IBW/kg (Calculated) : 73 Heparin Dosing Weight: 111kg  Vital Signs: Temp: 98.9 F (37.2 C) (06/04 0726) Temp Source: Axillary (06/04 0726) BP: 138/100 (06/04 0800) Pulse Rate: 104 (06/04 0800)  Labs: Recent Labs    04/09/18 0351  04/09/18 1845 04/10/18 0421 04/11/18 0526  HGB 15.9  --   --  15.7  --   HCT 48.7  --   --  47.2  --   PLT 196  --   --  173  --   LABPROT 18.9*  --   --  16.5* 15.0  INR 1.60  --   --  1.34 1.19  HEPARINUNFRC 0.23*   < > 0.43 0.34 0.44  CREATININE 2.81*  --   --  2.43* 2.45*   < > = values in this interval not displayed.    Estimated Creatinine Clearance: 59.4 mL/min (A) (by C-G formula based on SCr of 2.45 mg/dL (H)).   Medical History: Past Medical History:  Diagnosis Date  . Allergy   . Anxiety   . Arrhythmia   . Arthritis   . Atrial fibrillation ([place])   . CHF (congestive heart failure) ([place])   . CKD (chronic kidney disease)   . Diabetes mellitus without complication ([place])   . Diabetes mellitus, type II ([place])   . Heart failure ([place])   . Hyperlipidemia   . Hypertension   . Pancreatitis   . Sleep apnea     Medications:  Scheduled:  . chlorhexidine gluconate (MEDLINE KIT)  15 mL Mouth Rinse BID  . famotidine  20 mg Per Tube BID  . feeding supplement (GLUCERNA 1.5 CAL)  1,000 mL Per Tube Q24H  . feeding supplement (PRO-STAT SUGAR FREE 64)  60 mL Per Tube 5 X Daily  . furosemide  20 mg Intravenous Daily  . insulin aspart  3-9 Units Subcutaneous Q4H  . insulin detemir  20 Units Subcutaneous Q12H  . mouth rinse  15 mL Mouth Rinse 10 times per day  . multivitamin  15 mL Per Tube Daily  . senna-docusate   2 tablet Per Tube BID   Infusions:  . sodium chloride    . amiodarone 30 mg/hr (04/11/18 0913)  . fentaNYL infusion INTRAVENOUS 175 mcg/hr (04/11/18 0856)  . heparin 1,800 Units/hr (04/11/18 0855)  . insulin (NOVOLIN-R) infusion Stopped (04/11/18 0724)  . levETIRAcetam Stopped (04/10/18 2132)  . norepinephrine (LEVOPHED) Adult infusion Stopped (04/11/18 0131)  . propofol (DIPRIVAN) infusion 10 mcg/kg/min (04/11/18 0855)  . vancomycin Stopped (04/11/18 0308)    Assessment: Pharmacy consulted for warfarin and heparin drip management for 47 yo male admitted to the ICU s/p cardiac arrest. Patient has history significant for aortic valve replacement, atrial fibrillation and CHF. Patient currently requiring amiodarone infusion and blood pressure support from norepinephrine. Head CT is negative for infarction or hemorrhage.   Patient takes warfarin 83m daily as an outpatient for goal INR 2.5-3.5.   Heparin infusing at 1600 units/hr.   Goal of Therapy:  INR 2-3 Heparin level 0.3-0.7 units/ml Monitor platelets by anticoagulation protocol: Yes   Plan:  Continue current rate of 1800 units/hr.   Pharmacy will continue to monitor and adjust per consult.  Schelly Chuba L, 04/11/2018 9:46  AM   

## 2018-04-11 NOTE — Progress Notes (Signed)
[facility] - [place] [facility]   PATIENT NAME: Alexander HeadyOtis Alexander    MR#:  [id number]  DATE OF BIRTH:  05/07/1971  SUBJECTIVE:   Patient intubated and on the ventilator.  FiO2 100% PEEP 10 Patient is arousable on 2 sedatives propofol and fentanyl, seen by neurology REVIEW OF SYSTEMS:   Review of Systems  Unable to perform ROS: Intubated   Tolerating Diet:Tube feeding Tolerating PT: intubated  DRUG ALLERGIES:   Allergies  Allergen Reactions  . Lexapro [Escitalopram Oxalate] Swelling    VITALS:  Blood pressure 108/67, pulse 96, temperature 99.3 F (37.4 C), temperature source Axillary, resp. rate 20, height 5\' 10"  (1.778 m), weight (!) 175.3 kg (386 lb 7.5 oz), SpO2 95 %.  PHYSICAL EXAMINATION:   Physical Exam  GENERAL:  47 y.o.-year-old patient lying in the bed with no acute distress. Morbidly obese EYES: Pupils equal, round, reactive to light and accommodation. No scleral icterus.  HEENT: Head atraumatic, normocephalic. Oropharynx and nasopharynx clear. Intubated and on the vent NECK:  Supple, no jugular venous distention. No thyroid enlargement, no tenderness.  LUNGS: Normal breath sounds bilaterally, no wheezing, rales, rhonchi. No use of accessory muscles of respiration.  CARDIOVASCULAR: S1, S2 normal. No murmurs, rubs, or gallops.  ABDOMEN: Soft, nontender, nondistended. Bowel sounds present. No organomegaly or mass.  EXTREMITIES: No cyanosis, clubbing or edema b/l.    NEUROLOGIC: unable to assess --pt intubated PSYCHIATRIC: intubated  SKIN: No obvious rash, lesion, or ulcer.   LABORATORY PANEL:  CBC Recent Labs  Lab 04/10/18 0421  WBC 9.4  HGB 15.7  HCT 47.2  PLT 173    Chemistries  Recent Labs  Lab 04/08/18 0544  04/11/18 0526  NA 140   < > 139  K 3.7   < > 4.0  CL 98*   < > 100*  CO2 29   < > 28  GLUCOSE 209*   < > 121*  BUN 43*   < > 38*  CREATININE 2.31*   < > 2.45*  CALCIUM 8.2*   < > 8.3*  MG 1.8   < > 2.2  AST 41   --   --   ALT 34  --   --   ALKPHOS 49  --   --   BILITOT 1.7*  --   --    < > = values in this interval not displayed.   Cardiac Enzymes Recent Labs  Lab 04/08/18 0427  TROPONINI 0.16*   RADIOLOGY:  Dg Chest Port 1 View  Result Date: 04/10/2018 CLINICAL DATA:  Acute respiratory failure EXAM: PORTABLE CHEST 1 VIEW COMPARISON:  Earlier today at 1004 hours FINDINGS: Mildly degraded exam due to AP portable technique and patient body habitus. Endotracheal tube terminates 3.3 cm above carina.Prior median sternotomy. Nasogastric tube not well visualized distally but likely extends beyond the inferior aspect of the film. Right internal jugular line tip at low SVC. Cardiomegaly accentuated by AP portable technique. Small bilateral pleural effusions. No pneumothorax. Mild interstitial edema, superimposed upon low lung volumes. Bibasilar airspace disease. IMPRESSION: Decreased sensitivity and specificity exam due to technique related factors, as described above. Cardiomegaly with mild congestive heart failure, low lung volumes, and layering bilateral pleural effusions. Bibasilar airspace disease which is most likely atelectasis. Electronically Signed   By: Jeronimo GreavesKyle  Talbot M.D.   On: 04/10/2018 19:42   Dg Chest Port 1 View  Result Date: 04/10/2018 CLINICAL DATA:  Hypoxia EXAM: PORTABLE CHEST 1 VIEW COMPARISON:  April 09, 2018 FINDINGS: Endotracheal tube tip is 4.3 cm above the carina. Central catheter tip is at cavoatrial junction. Nasogastric tube tip and side port below the diaphragm. No pneumothorax. There is patchy airspace consolidation in both medial lung bases with small pleural effusions bilaterally. Lungs elsewhere clear. Heart is mildly enlarged with pulmonary vascularity normal. No adenopathy. Patient is status post median sternotomy. No bone lesions evident. IMPRESSION: Tube and catheter positions as described without pneumothorax. Suspect bibasilar pneumonia. Small pleural effusions. Stable cardiac  prominence. Electronically Signed   By: Bretta Bang III M.D.   On: 04/10/2018 10:27   Dg Abd Portable 1v  Result Date: 04/10/2018 CLINICAL DATA:  Status post OG tube placement. EXAM: PORTABLE ABDOMEN - 1 VIEW COMPARISON:  Single view of the abdomen 04/07/2018. FINDINGS: OG tube tip  and sideport are both in the stomach. IMPRESSION: OG tube in good position. Electronically Signed   By: Drusilla Kanner M.D.   On: 04/10/2018 14:32   ASSESSMENT AND PLAN:  Alexander Alexander  is a 47 y.o. male who presents with complaint of low back pain.  Patient is a known history of sciatica, medicated for similar complaint of low back pain with radiation down his legs.  Initially he told ED physician and the pain was much worse than it has been for him, and so MRI was ordered.  While patient was in radiology department waiting to start MRI he had a cardiac arrest.   *Acute respiratory failure with hypoxemia initially thought to be from cardiac arrest [facility]) - - He was intubated during his code event, and is being sent to the ICU for further treatment.    follow with intensivist currently FiO2-100% vent management by intensivist Discussed with intensivist  *Pulseless electric activity after back pain possibly consistent with a respiratory event rather than a cardiac event per cardiology -Patient is hemodynamically stable -Echocardiogram with 55 to 60% ejection fraction -cardiology  following discussed with Dr. Gwen Pounds  *  Chronic combined systolic (congestive) and diastolic (congestive) heart failure (HCC) -also potentially causing some arrhythmias, including his ventricular rhythm which may have been responsible for his cardiac arrest.  He had significant runs of V. tach in the ED postarrest.  -Patient is off IV amiodarone.  *Coagulopathy with Warfarin and subtherapeutic INR. S/p Mechanical aortic valve replacement.  Currently on heparin drip  * AKI on  CKD (chronic kidney disease) stage 3, with hyperkalemia   Hyperkalemia resolved  creatinine at 2.45  Was volume, avoid nephrotoxins and monitor urine output, will consider nephrology consult if needed  *Elevated LFTs-probably acute phase reactants monitor closely Check CMP in a.m.  *HTN (hypertension) -hold home dose antihypertensives for now as the antiarrhythmics and his code event have dropped his blood pressure.  Patient is currently on IV pressors  *  T2DM (type 2 diabetes mellitus) (HCC) -sliding scale insulin with corresponding glucose checks  *  Atrial fibrillation (HCC) -continue rate controlling medications as well as anticoagulation with IV heparin, consider changing back over to warfarin with INR goal between 2-3  *  Aortic valve replaced -continue anticoagulation with IV heparin and consider changing back over to warfarin with INR goal between 2-3  *  OSA (obstructive sleep apnea) -currently intubated, once he is extubated he will need CPAP nightly   *  Hyperlipidemia associated with type 2 diabetes mellitus (HCC) -continue home dose antilipid  Spoke with mom and wife  Case discussed with Care Management/Social Worker. Management plans discussed with the patient, wife at bedside and  they are in agreement.  CODE STATUS: full  DVT Prophylaxis: heparin gtt  TOTAL TIME TAKING CARE OF THIS PATIENT: 33 minutes.  >50% time spent on counselling and coordination of care  POSSIBLE D/C IN few DAYS, DEPENDING ON CLINICAL CONDITION.  Note: This dictation was prepared with Dragon dictation along with smaller phrase technology. Any transcriptional errors that result from this process are unintentional.  Ramonita Lab M.D on 04/11/2018 at 2:54 PM  Between 7am to 6pm - Pager - [phone]  After 6pm go to [email or web address] - Social research officer, government  [facility]  Office  [phone]  CC: Primary care physician; Erasmo Downer, MDPatient ID: Alexander Alexander, male   DOB: 11-19-1970, 47 y.o.   MRN: [id number]

## 2018-04-11 NOTE — Progress Notes (Signed)
[place] for heparin drip management/warfarin management  Indication: atrial fibrillation and aortic valve replacement   Allergies  Allergen Reactions  . Lexapro [Escitalopram Oxalate] Swelling    Patient Measurements: Height: _0  (177.8 cm)(based on previous notes) Weight: (!) 386 lb 7.5 oz (175.3 kg) IBW/kg (Calculated) : 73 Heparin Dosing Weight: 111kg  Vital Signs: Temp: 99.3 F (37.4 C) (06/04 0344) Temp Source: Oral (06/04 0344) BP: 161/111 (06/04 0545) Pulse Rate: 93 (06/04 0545)  Labs: Recent Labs    04/08/18 1754 04/09/18 0351  04/09/18 1845 04/10/18 0421 04/11/18 0526  HGB  --  15.9  --   --  15.7  --   HCT  --  48.7  --   --  47.2  --   PLT  --  196  --   --  173  --   LABPROT  --  18.9*  --   --  16.5* 15.0  INR  --  1.60  --   --  1.34 1.19  HEPARINUNFRC  --  0.23*   < > 0.43 0.34 0.44  CREATININE 2.33* 2.81*  --   --  2.43*  --    < > = values in this interval not displayed.    Estimated Creatinine Clearance: 59.9 mL/min (A) (by C-G formula based on SCr of 2.43 mg/dL (H)).   Medical History: Past Medical History:  Diagnosis Date  . Allergy   . Anxiety   . Arrhythmia   . Arthritis   . Atrial fibrillation ([place])   . CHF (congestive heart failure) ([place])   . CKD (chronic kidney disease)   . Diabetes mellitus without complication ([place])   . Diabetes mellitus, type II ([place])   . Heart failure ([place])   . Hyperlipidemia   . Hypertension   . Pancreatitis   . Sleep apnea     Medications:  Scheduled:  . chlorhexidine gluconate (MEDLINE KIT)  15 mL Mouth Rinse BID  . famotidine  20 mg Per Tube BID  . feeding supplement (GLUCERNA 1.5 CAL)  1,000 mL Per Tube Q24H  . feeding supplement (PRO-STAT SUGAR FREE 64)  60 mL Per Tube 5 X Daily  . furosemide  20 mg Intravenous Daily  . insulin aspart  3-9 Units Subcutaneous Q4H  . insulin detemir  20 Units Subcutaneous Q12H  . mouth rinse  15 mL Mouth Rinse 10 times  per day  . multivitamin  15 mL Per Tube Daily  . senna-docusate  2 tablet Per Tube BID   Infusions:  . sodium chloride    . amiodarone 30 mg/hr (04/10/18 2113)  . fentaNYL infusion INTRAVENOUS 200 mcg/hr (04/11/18 0533)  . heparin 1,800 Units/hr (04/10/18 1837)  . insulin (NOVOLIN-R) infusion 2.8 Units/hr (04/11/18 0539)  . levETIRAcetam Stopped (04/10/18 2132)  . norepinephrine (LEVOPHED) Adult infusion Stopped (04/11/18 0131)  . propofol (DIPRIVAN) infusion 10 mcg/kg/min (04/11/18 0433)  . vancomycin Stopped (04/11/18 0308)    Assessment: Pharmacy consulted for warfarin and heparin drip management for 47 yo male admitted to the ICU s/p cardiac arrest. Patient has history significant for aortic valve replacement, atrial fibrillation and CHF. Patient currently requiring amiodarone infusion and blood pressure support from norepinephrine. Head CT is negative for infarction or hemorrhage.   Patient takes warfarin 20m daily as an outpatient for goal INR 2.5-3.5.   Heparin infusing at 1600 units/hr.   Goal of Therapy:  INR 2-3 Heparin level 0.3-0.7 units/ml Monitor platelets by anticoagulation protocol: Yes  Plan:  06/03 @ 0430 HL 0.34 therapeutic. Will continue current rate of 1800 units/hr and will recheck w/ am labs.  06/04 @ 0530 HL 0.44 therapeutic. Will continue rate of 1800 units/hr and recheck w/ am labs.  Tobie Lords, PharmD, BCPS Clinical Pharmacist 04/11/2018 5:52 AM

## 2018-04-11 NOTE — Progress Notes (Signed)
Unable to obtain accurate temperature due to malfunction in SizeWise bed (mattress very warm to touch). Will replace bed and obtain temperature at that time.

## 2018-04-11 NOTE — Progress Notes (Signed)
Follow up - Critical Care Medicine Note  Patient Details:    Alexander Alexander is an 47 y.o. male. 47 year old gentleman with past medical history of atrial fibrillation, status post ablation and aortic valve replacement on warfarin, history of congestive heart failure, chronic renal disease, morbid obesity, diabetes, hyperlipidemia, obstructive sleep apnea and hypertension, suffered a pulseless V. Tach cardiac arrest in MRI when he was being evaluated for back pain and sciatica, subsequently intubated in the intensive care unit.  Lines, Airways, Drains: Airway 7.5 mm (Active)  Secured at (cm) 24 cm 04/10/2018  4:16 AM  Measured From Lips 04/10/2018  4:16 AM  Secured Location Right 04/10/2018  4:16 AM  Secured By [employer] Tube Holder 04/10/2018  4:16 AM  Tube Holder Repositioned Yes 04/10/2018  2:21 AM  Cuff Pressure (cm H2O) 26 cm H2O 04/09/2018  6:11 PM  Site Condition Dry 04/10/2018  4:16 AM     CVC Triple Lumen 04/07/18 Right External jugular (Active)  Indication for Insertion or Continuance of Line Vasoactive infusions;Prolonged intravenous therapies 04/09/2018  8:00 PM  Site Assessment Clean;Dry;Intact 04/09/2018  8:00 PM  Proximal Lumen Status Infusing;Flushed 04/09/2018  8:00 PM  Medial Lumen Status Infusing;Flushed 04/09/2018  8:00 PM  Distal Lumen Status Infusing;Flushed 04/09/2018  8:00 PM  Dressing Type Transparent 04/09/2018  8:00 PM  Dressing Status Clean;Dry;Intact;Antimicrobial disc in place 04/09/2018  8:00 PM  Line Care Connections checked and tightened 04/09/2018  8:00 PM  Dressing Intervention Dressing changed;Antimicrobial disc changed 04/10/2018  4:31 AM  Dressing Change Due 04/17/18 04/10/2018  4:31 AM     NG/OG Tube Orogastric 18 Fr. Right mouth Aucultation 55 cm (Active)  Cm Marking at Nare/Corner of Mouth (if applicable) 55 cm 04/10/2018  4:16 AM  Site Assessment Clean;Dry;Intact 04/10/2018  4:16 AM  Ongoing Placement Verification No change in cm markings or external length of tube from initial  placement;No change in respiratory status;No acute changes, not attributed to clinical condition 04/10/2018  4:16 AM  Status Infusing tube feed 04/10/2018  4:16 AM  Intake (mL) 30 mL 04/08/2018  5:00 PM     Urethral Catheter Myra Temperature probe 16 Fr. (Active)  Indication for Insertion or Continuance of Catheter Other (comment) 04/10/2018  4:16 AM  Site Assessment Clean;Intact;Dry 04/10/2018  4:16 AM  Catheter Maintenance Bag below level of bladder;Catheter secured;Drainage bag/tubing not touching floor;Seal intact;No dependent loops;Insertion date on drainage bag;Bag emptied prior to transport 04/10/2018  4:16 AM  Collection Container Standard drainage bag 04/10/2018  4:16 AM  Securement Method Securing device (Describe) 04/10/2018  4:16 AM  Urinary Catheter Interventions Unclamped 04/10/2018  4:16 AM  Output (mL) 75 mL 04/10/2018  5:41 AM    Anti-infectives:  Anti-infectives (From admission, onward)   Start     Dose/Rate Route Frequency Ordered Stop   04/10/18 1400  vancomycin (VANCOCIN) 1,250 mg in sodium chloride 0.9 % 250 mL IVPB     1,250 mg 166.7 mL/hr over 90 Minutes Intravenous Every 12 hours 04/10/18 1149     04/10/18 1200  Ampicillin-Sulbactam (UNASYN) 3 g in sodium chloride 0.9 % 100 mL IVPB  Status:  Discontinued     3 g 200 mL/hr over 30 Minutes Intravenous Every 6 hours 04/10/18 0851 04/10/18 1131   04/09/18 1800  vancomycin (VANCOCIN) 1,500 mg in sodium chloride 0.9 % 500 mL IVPB  Status:  Discontinued     1,500 mg 250 mL/hr over 120 Minutes Intravenous Every 18 hours 04/09/18 1303 04/10/18 0750   04/09/18 1045  vancomycin (  VANCOCIN) IVPB 1000 mg/200 mL premix     1,000 mg 200 mL/hr over 60 Minutes Intravenous  Once 04/09/18 1041 04/09/18 1307   04/07/18 1400  piperacillin-tazobactam (ZOSYN) IVPB 3.375 g  Status:  Discontinued     3.375 g 12.5 mL/hr over 240 Minutes Intravenous Every 8 hours 04/07/18 1015 04/10/18 0851      Microbiology: Results for orders placed or performed  during the hospital encounter of 04/07/18  MRSA PCR Screening     Status: None   Collection Time: 04/07/18  9:49 AM  Result Value Ref Range Status   MRSA by PCR NEGATIVE NEGATIVE Final    Comment:        The GeneXpert MRSA Assay (FDA approved for NASAL specimens only), is one component of a comprehensive MRSA colonization surveillance program. It is not intended to diagnose MRSA infection nor to guide or monitor treatment for MRSA infections. Performed at [facility], [address] [place], [place] [address]   Culture, respiratory (NON-Expectorated)     Status: None   Collection Time: 04/08/18 10:53 AM  Result Value Ref Range Status   Specimen Description   Final    TRACHEAL ASPIRATE Performed at [facility], [address], [place], [place] [address]    Special Requests   Final    Normal Performed at [facility], [address] [place], [place] [address]    Gram Stain   Final    ABUNDANT WBC PRESENT, PREDOMINANTLY PMN NO SQUAMOUS EPITHELIAL CELLS SEEN MODERATE GRAM POSITIVE COCCI IN CLUSTERS FEW GRAM NEGATIVE COCCOBACILLI RARE GRAM POSITIVE RODS Performed at [facility], [address] [place], [place] [address]    Culture   Final    ABUNDANT METHICILLIN RESISTANT STAPHYLOCOCCUS AUREUS   Report Status 04/10/2018 FINAL  Final   Organism ID, Bacteria METHICILLIN RESISTANT STAPHYLOCOCCUS AUREUS  Final      Susceptibility   Methicillin resistant staphylococcus aureus - MIC*    CIPROFLOXACIN >=8 RESISTANT Resistant     ERYTHROMYCIN >=8 RESISTANT Resistant     GENTAMICIN <=0.5 SENSITIVE Sensitive     OXACILLIN >=4 RESISTANT Resistant     TETRACYCLINE <=1 SENSITIVE Sensitive     VANCOMYCIN 1 SENSITIVE Sensitive     TRIMETH/SULFA <=10 SENSITIVE Sensitive     CLINDAMYCIN <=0.25 SENSITIVE Sensitive     RIFAMPIN <=0.5 SENSITIVE Sensitive     Inducible Clindamycin NEGATIVE Sensitive     * ABUNDANT METHICILLIN RESISTANT  STAPHYLOCOCCUS AUREUS  CULTURE, BLOOD (ROUTINE X 2) w Reflex to ID Panel     Status: None (Preliminary result)   Collection Time: 04/08/18 11:43 AM  Result Value Ref Range Status   Specimen Description BLOOD LAC  Final   Special Requests   Final    BOTTLES DRAWN AEROBIC AND ANAEROBIC Blood Culture adequate volume   Culture   Final    NO GROWTH 3 DAYS Performed at [facility], [address], [place], [place] [address]    Report Status PENDING  Incomplete  CULTURE, BLOOD (ROUTINE X 2) w Reflex to ID Panel     Status: None (Preliminary result)   Collection Time: 04/08/18 11:43 AM  Result Value Ref Range Status   Specimen Description BLOOD LT HAND  Final   Special Requests   Final    BOTTLES DRAWN AEROBIC AND ANAEROBIC Blood Culture adequate volume   Culture   Final    NO GROWTH 3 DAYS Performed at [facility], [address]  [place], [place] [address]    Report Status PENDING  Incomplete  Urine Culture     Status: None   Collection Time: 04/08/18  1:16 PM  Result Value Ref Range Status   Specimen Description   Final    URINE, CATHETERIZED Performed at [facility], [address], [place], [place] [address]    Special Requests   Final    Normal Performed at [facility], [address] [place], [place] [address]    Culture   Final    NO GROWTH Performed at [facility], [address], [place], [place] [address]    Report Status 04/09/2018 FINAL  Final   Events: Patient has required increasing FiO2 requirements. Has been successfully weaned off of pressors and given multiple doses of diuretics. Started back on amiodarone for rapid atrial fibrillation.  Studies: Dg Chest 1 View  Result Date: 04/07/2018 CLINICAL DATA:  47 y/o  M; intubated. EXAM: CHEST  1 VIEW COMPARISON:  12/06/2017 chest radiograph FINDINGS: Stable cardiomegaly given projection and technique. Transcutaneous pacing pads. Post median sternotomy with wires in  alignment. Low lung volumes. Pulmonary vascular congestion. No focal consolidation. Endotracheal tube tip 2.4 cm above the carina. Enteric tube tip appears to project over the lower esophagus, advancement recommended. IMPRESSION: 1. Endotracheal tube tip 2.4 cm above the carina. 2. Cardiomegaly and pulmonary vascular congestion. 3. Enteric tube tip appears to project over the lower esophagus, advancement recommended. Electronically Signed   By: Mitzi Hansen M.D.   On: 04/07/2018 06:22   Ct Head Wo Contrast  Result Date: 04/09/2018 CLINICAL DATA:  47 year old with acute mental status changes after cardiac arrest. Possible new onset seizures. EXAM: CT HEAD WITHOUT CONTRAST TECHNIQUE: Contiguous axial images were obtained from the base of the skull through the vertex without intravenous contrast. COMPARISON:  04/07/2018. FINDINGS: Brain: Ventricular system normal in size and appearance for age. Physiologic calcifications in the basal ganglia bilaterally as noted previously. No mass lesion. No midline shift. No acute hemorrhage or hematoma. No extra-axial fluid collections. No evidence of acute infarction. No evidence of cerebral edema at this time. Vascular: Moderate BILATERAL carotid siphon and vertebrobasilar atherosclerosis. No hyperdense vessel. Skull: No skull fracture or other focal osseous abnormality involving the skull. Sinuses/Orbits: Visualized paranasal sinuses, bilateral mastoid air cells and bilateral middle ear cavities well-aerated. Visualized orbits and globes normal in appearance. Other: None. IMPRESSION: No acute intracranial abnormality. Electronically Signed   By: Hulan Saas M.D.   On: 04/09/2018 11:20   Ct Head Wo Contrast  Result Date: 04/07/2018 CLINICAL DATA:  Cardiac arrest EXAM: CT HEAD WITHOUT CONTRAST TECHNIQUE: Contiguous axial images were obtained from the base of the skull through the vertex without intravenous contrast. COMPARISON:  None. FINDINGS: Brain: No  evidence of acute infarction, hemorrhage, hydrocephalus, extra-axial collection or mass lesion/mass effect. Vascular: No hyperdense vessel or unexpected calcification. Skull: Negative Sinuses/Orbits: Negative Other: None IMPRESSION: Negative CT head Electronically Signed   By: Marlan Palau M.D.   On: 04/07/2018 15:25   Dg Chest Port 1 View  Result Date: 04/10/2018 CLINICAL DATA:  Acute respiratory failure EXAM: PORTABLE CHEST 1 VIEW COMPARISON:  Earlier today at 1004 hours FINDINGS: Mildly degraded exam due to AP portable technique and patient body habitus. Endotracheal tube terminates 3.3 cm above carina.Prior median sternotomy. Nasogastric tube not well visualized distally but likely extends beyond the inferior aspect of the film. Right internal jugular line tip at low SVC. Cardiomegaly accentuated by AP portable technique. Small  bilateral pleural effusions. No pneumothorax. Mild interstitial edema, superimposed upon low lung volumes. Bibasilar airspace disease. IMPRESSION: Decreased sensitivity and specificity exam due to technique related factors, as described above. Cardiomegaly with mild congestive heart failure, low lung volumes, and layering bilateral pleural effusions. Bibasilar airspace disease which is most likely atelectasis. Electronically Signed   By: Jeronimo Greaves M.D.   On: 04/10/2018 19:42   Dg Chest Port 1 View  Result Date: 04/10/2018 CLINICAL DATA:  Hypoxia EXAM: PORTABLE CHEST 1 VIEW COMPARISON:  April 09, 2018 FINDINGS: Endotracheal tube tip is 4.3 cm above the carina. Central catheter tip is at cavoatrial junction. Nasogastric tube tip and side port below the diaphragm. No pneumothorax. There is patchy airspace consolidation in both medial lung bases with small pleural effusions bilaterally. Lungs elsewhere clear. Heart is mildly enlarged with pulmonary vascularity normal. No adenopathy. Patient is status post median sternotomy. No bone lesions evident. IMPRESSION: Tube and catheter  positions as described without pneumothorax. Suspect bibasilar pneumonia. Small pleural effusions. Stable cardiac prominence. Electronically Signed   By: Bretta Bang III M.D.   On: 04/10/2018 10:27   Dg Chest Port 1 View  Result Date: 04/09/2018 CLINICAL DATA:  Pneumonia EXAM: PORTABLE CHEST 1 VIEW COMPARISON:  April 08, 2018 FINDINGS: The ETT is in good position. The distal NG tube terminates below today's film. Stable cardiomegaly. Bibasilar opacities may represent small effusions with underlying atelectasis, stable on the right and mildly increased on the left. The right IJ is stable. No other interval changes. IMPRESSION: 1. Support apparatus as above. 2. Bibasilar opacities may represent small effusions and underlying atelectasis, stable on the right and mildly increased on the left in the interval. Electronically Signed   By: Gerome Sam III M.D   On: 04/09/2018 07:14   Dg Chest Port 1 View  Result Date: 04/08/2018 CLINICAL DATA:  Followup pneumonia. EXAM: PORTABLE CHEST 1 VIEW COMPARISON:  04/07/2018 and older exams. FINDINGS: Lung volumes are low. This accentuates the appearance of vascular congestion and interstitial prominence. There is mild linear atelectasis in the right mid lung, stable from the prior exam. Bilateral lung base opacity is most likely atelectasis. Pneumonia is possible. No overt pulmonary edema. Is Stable changes from prior cardiac surgery. Cardiac silhouette is mildly enlarged. Endotracheal tube, right internal jugular central venous line and nasal/orogastric tube are stable. IMPRESSION: 1. No significant change from the previous day's study. 2. Vascular congestion with interstitial prominence, but without overt pulmonary edema. 3. Basilar atelectasis.  Cannot exclude pneumonia. Electronically Signed   By: Amie Portland M.D.   On: 04/08/2018 07:23   Dg Chest Portable 1 View  Result Date: 04/07/2018 CLINICAL DATA:  Central catheter placement.  Hypoxia. EXAM: PORTABLE  CHEST 1 VIEW COMPARISON:  Apr 07, 2018 study obtained earlier in the day FINDINGS: Right jugular catheter tip is in the superior vena cava near the cavoatrial junction. Endotracheal tube tip is 4.0 cm above the carina. Nasogastric tube tip and side port are below the diaphragm. No pneumothorax. There is bibasilar atelectasis. Lungs elsewhere are clear. 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 No adenopathy. No bone lesions. Patient is status post median sternotomy with aortic valve replacement. IMPRESSION: Tube and catheter positions as described without pneumothorax. There is pulmonary vascular congestion. Bibasilar atelectasis present. No consolidation evident. Electronically Signed   By: Bretta Bang III M.D.   On: 04/07/2018 08:51   Dg Abd Portable 1v  Result Date: 04/10/2018 CLINICAL DATA:  Status post OG tube placement. EXAM:  PORTABLE ABDOMEN - 1 VIEW COMPARISON:  Single view of the abdomen 04/07/2018. FINDINGS: OG tube tip  and sideport are both in the stomach. IMPRESSION: OG tube in good position. Electronically Signed   By: Drusilla Kanner M.D.   On: 04/10/2018 14:32   Dg Abd Portable 1v  Result Date: 04/07/2018 CLINICAL DATA:  NG placement EXAM: PORTABLE ABDOMEN - 1 VIEW COMPARISON:  None. FINDINGS: Limited exam due to large patient size and difficulty with positioning. NG tip in the proximal stomach. NG side hole in the distal esophagus. No dilated bowel loops. IMPRESSION: NG tube in the proximal stomach with the side hole in the distal esophagus. Recommend advancing at least 5 cm. Electronically Signed   By: Marlan Palau M.D.   On: 04/07/2018 10:48    Consults: Treatment Team:  Lamar Blinks, MD Uvaldo Rising, MD Anson Fret, MD Thana Farr, MD   Objective:  Vital signs for last 24 hours: Temp:  [98.9 F (37.2 C)-100.8 F (38.2 C)] 98.9 F (37.2 C) (06/04 0726) Pulse Rate:  [73-240] 83 (06/04 0600) Resp:  [0-53] 20 (06/04 0600) BP:  (69-161)/(50-140) 130/119 (06/04 0600) SpO2:  [88 %-99 %] 97 % (06/04 0600) FiO2 (%):  [80 %-100 %] 100 % (06/04 0344) Weight:  [386 lb 7.5 oz (175.3 kg)] 386 lb 7.5 oz (175.3 kg) (06/04 0500)  Hemodynamic parameters for last 24 hours:    Intake/Output from previous day: 06/03 0701 - 06/04 0700 In: 3239.2 [I.V.:2388.2; NG/GT:351; IV Piggyback:500] Out: 1780 [Urine:1780]  Intake/Output this shift: Total I/O In: -  Out: 150 [Urine:150]  Vent settings for last 24 hours: Vent Mode: PRVC FiO2 (%):  [80 %-100 %] 100 % Set Rate:  [20 bmp] 20 bmp Vt Set:  [550 mL] 550 mL PEEP:  [5 cmH20-10 cmH20] 10 cmH20 Plateau Pressure:  [26 cmH20] 26 cmH20  Physical Exam:  Patient is arousable but somnolent. Presently on propofol HEENT: Patient is orally intubated, central line noted, trachea is midline, no accessory muscle utilization Cardiovascular: Atrial fibrillation noted on monitor, irregularly irregular rhythm with ventricular response averaging in the low 100s Pulmonary: Basilar crackles noted Abdominal: Distended abdomen, bowel sounds noted Extremity: 1+ edema noted Neurologic: Patient moves all extremities Cutaneous: No rashes or lesions noted  Assessment/Plan:    Acute respiratory failurewith hypoxemia status post cardiac arrest. combination of volume overload and possible pneumonia. Patient has grown staph species and sputum presently on vancomycin and Unasyn. Has been weaned off of pressors so resuming diuretic therapy as blood pressure will tolerate  Pulseless V. tach cardiac arrest. ECHO LVEF 55-60%.Cardiology arrest was secondary to respiratory issues  Altered mental status. appreciate neurology input. CT scan of the head was negative, no evidence of anoxic brain injury, presently empirically started on Keppra for possible partial seizure  AKI on CKD. BUN is 38 and creatinine 2.45 this morning, slightly worsethan yesterday's. Potassium is 4  Hyperglycemia. Glycemic  control.  Coagulopathy with Warfarin and subtherapeutic INR. S/p Mechanical aortic valve replacement.     Critical Care Total Time 35 minutes  Dewaun Kinzler 04/11/2018  *Care during the described time interval was provided by me and/or other providers on the critical care team.  I have reviewed this patient's available data, including medical history, events of note, physical examination and test results as part of my evaluation. Patient ID: Alexander Alexander, male   DOB: 1971-04-02, 47 y.o.   MRN: [id number]

## 2018-04-11 NOTE — Progress Notes (Addendum)
Pharmacy Electrolyte Monitoring Consult:  Pharmacy consulted to assist in monitoring and replacing electrolytes in this 47 y.o. male admitted on 04/07/2018 s/p cardiac arrest and requiring mechanical ventilation.   Patient received 100mg  of IV furosemide on 6/4.   Labs:  Sodium (mmol/L)  Date Value  04/11/2018 139  03/29/2018 142   Potassium (mmol/L)  Date Value  04/11/2018 4.0   Magnesium (mg/dL)  Date Value  04/54/098106/02/2018 2.2   Phosphorus (mg/dL)  Date Value  19/14/782906/02/2018 4.0   Calcium (mg/dL)  Date Value  56/21/308606/02/2018 8.3 (L)   Albumin (g/dL)  Date Value  57/84/696206/11/2017 3.5  12/09/2017 4.2    Assessment/Plan: Potassium 40mEq IV x 1. Will recheck all electrolytes with am labs.   Pharmacy will continue to monitor and adjust per consult.   Simpson,Michael L,  04/11/2018 9:47 AM

## 2018-04-11 NOTE — Progress Notes (Signed)
Pharmacy Antibiotic Note  Alexander Alexander is a 47 y.o. male admitted on 04/07/2018 s/p cardiac arrest started on antibiotics for MRSA Pneumonia Pharmacy has been consulted for Vancomycin dosing.  Plan: Vancomycin random level 23. Will transition patient to vancomycin 1250mg  IV Q18hr for goal trough of 15-20. Patient is morbidly obese and with AKI will monitor serum creatinine and will obtain follow up vancomycin level as indicated.   Height: 5\' 10"  (177.8 cm)(based on previous notes) Weight: (!) 386 lb 7.5 oz (175.3 kg) IBW/kg (Calculated) : 73  Temp (24hrs), Avg:99.6 F (37.6 C), Min:98.9 F (37.2 C), Max:100.8 F (38.2 C)  Recent Labs  Lab 04/07/18 0546  04/07/18 1233 04/07/18 1546  04/08/18 1053 04/08/18 1754 04/09/18 0351 04/10/18 0421 04/10/18 1218 04/11/18 0526 04/11/18 1356  WBC 9.4  --   --   --   --   --   --  9.6 9.4  --   --   --   CREATININE 2.50*   < >  --   --    < > 2.34* 2.33* 2.81* 2.43*  --  2.45*  --   LATICACIDVEN  --   --  2.4* 2.9*  --   --   --   --   --   --   --   --   VANCORANDOM  --   --   --   --   --   --   --   --   --  12  --  23   < > = values in this interval not displayed.    Estimated Creatinine Clearance: 59.4 mL/min (A) (by C-G formula based on SCr of 2.45 mg/dL (H)).    Allergies  Allergen Reactions  . Lexapro [Escitalopram Oxalate] Swelling    Antimicrobials this admission: Zosyn 5/31 >> 6/3 Vancomycin 6/2 >> Unasyn 6/3 x 1.   Dose adjustments this admission: 6/3 Vancomycin transitioned to 1250mg  IV Q12hr.  6/4 Vancomycin transitioned to 1250mg  IV Q18hr.   Microbiology results: 5/31 MRSA PCR: neg 6/1 BCx: no growth x 3 days 6/1 UCx: No growth  6/1 Resp Cx/Trach Aspirate: MRSA   Thank you for allowing pharmacy to be a part of this patient's care.  Yolanda BonineHannah Lifsey, PharmD Pharmacy Resident 04/11/2018 6:57 PM

## 2018-04-12 ENCOUNTER — Inpatient Hospital Stay: Payer: Medicare HMO

## 2018-04-12 LAB — PROTIME-INR
INR: 1.1
PROTHROMBIN TIME: 14.1 s (ref 11.4–15.2)

## 2018-04-12 LAB — GLUCOSE, CAPILLARY
GLUCOSE-CAPILLARY: 299 mg/dL — AB (ref 65–99)
GLUCOSE-CAPILLARY: 288 mg/dL — AB (ref 65–99)
GLUCOSE-CAPILLARY: 299 mg/dL — AB (ref 65–99)
GLUCOSE-CAPILLARY: 238 mg/dL — AB (ref 65–99)
GLUCOSE-CAPILLARY: 242 mg/dL — AB (ref 65–99)
GLUCOSE-CAPILLARY: 328 mg/dL — AB (ref 65–99)
GLUCOSE-CAPILLARY: 249 mg/dL — AB (ref 65–99)
GLUCOSE-CAPILLARY: 181 mg/dL — AB (ref 65–99)
GLUCOSE-CAPILLARY: 209 mg/dL — AB (ref 65–99)
GLUCOSE-CAPILLARY: 212 mg/dL — AB (ref 65–99)
Glucose-Capillary: 286 mg/dL — ABNORMAL HIGH (ref 65–99)
Glucose-Capillary: 299 mg/dL — ABNORMAL HIGH (ref 65–99)
Glucose-Capillary: 305 mg/dL — ABNORMAL HIGH (ref 65–99)
Glucose-Capillary: 291 mg/dL — ABNORMAL HIGH (ref 65–99)
Glucose-Capillary: 253 mg/dL — ABNORMAL HIGH (ref 65–99)
Glucose-Capillary: 179 mg/dL — ABNORMAL HIGH (ref 65–99)
Glucose-Capillary: 239 mg/dL — ABNORMAL HIGH (ref 65–99)
Glucose-Capillary: 236 mg/dL — ABNORMAL HIGH (ref 65–99)
Glucose-Capillary: 235 mg/dL — ABNORMAL HIGH (ref 65–99)
Glucose-Capillary: 228 mg/dL — ABNORMAL HIGH (ref 65–99)

## 2018-04-12 LAB — COMPREHENSIVE METABOLIC PANEL
ALBUMIN: 2.9 g/dL — AB (ref 3.5–5.0)
ALK PHOS: 48 U/L (ref 38–126)
ALT: 37 U/L (ref 17–63)
ANION GAP: 12 (ref 5–15)
AST: 83 U/L — ABNORMAL HIGH (ref 15–41)
BILIRUBIN TOTAL: 1.6 mg/dL — AB (ref 0.3–1.2)
BUN: 44 mg/dL — ABNORMAL HIGH (ref 6–20)
CALCIUM: 8.6 mg/dL — AB (ref 8.9–10.3)
CO2: 27 mmol/L (ref 22–32)
Chloride: 99 mmol/L — ABNORMAL LOW (ref 101–111)
Creatinine, Ser: 2.5 mg/dL — ABNORMAL HIGH (ref 0.61–1.24)
GFR calc Af Amer: 33 mL/min — ABNORMAL LOW (ref >60)
GFR calc non Af Amer: 29 mL/min — ABNORMAL LOW (ref >60)
GLUCOSE: 301 mg/dL — AB (ref 65–99)
Potassium: 4.8 mmol/L (ref 3.5–5.1)
Sodium: 138 mmol/L (ref 135–145)
TOTAL PROTEIN: 7.6 g/dL (ref 6.5–8.1)

## 2018-04-12 LAB — C DIFFICILE QUICK SCREEN W PCR REFLEX
C DIFFICILE (CDIFF) TOXIN: NEGATIVE
C DIFFICLE (CDIFF) ANTIGEN: NEGATIVE
C Diff interpretation: NOT DETECTED

## 2018-04-12 LAB — MAGNESIUM: Magnesium: 2 mg/dL (ref 1.7–2.4)

## 2018-04-12 LAB — HEPARIN LEVEL (UNFRACTIONATED): Heparin Unfractionated: 0.37 IU/mL (ref 0.30–0.70)

## 2018-04-12 LAB — PHOSPHORUS: Phosphorus: 4.2 mg/dL (ref 2.5–4.6)

## 2018-04-12 MED ORDER — IBUPROFEN 400 MG PO TABS
600.0000 mg | ORAL_TABLET | Freq: Once | ORAL | Status: AC
  Administered 2018-04-12: 600 mg
  Filled 2018-04-12: qty 2

## 2018-04-12 NOTE — Progress Notes (Signed)
[facility] for heparin drip management Indication: atrial fibrillation and aortic valve replacement   Allergies  Allergen Reactions  . Lexapro [Escitalopram Oxalate] Swelling    Patient Measurements: Height: 5' 10"  (177.8 cm)(based on previous notes) Weight: (!) 400 lb 9.2 oz (181.7 kg) IBW/kg (Calculated) : 73 Heparin Dosing Weight: 111kg  Vital Signs: Temp: 100.7 F (38.2 C) (06/05 0405) Temp Source: Oral (06/05 0405) BP: 104/82 (06/05 0500) Pulse Rate: 98 (06/05 0500)  Labs: Recent Labs    04/10/18 0421 04/11/18 0526 04/12/18 0421  HGB 15.7  --   --   HCT 47.2  --   --   PLT 173  --   --   LABPROT 16.5* 15.0 14.1  INR 1.34 1.19 1.10  HEPARINUNFRC 0.34 0.44 0.37  CREATININE 2.43* 2.45* 2.50*    Estimated Creatinine Clearance: 59.5 mL/min (A) (by C-G formula based on SCr of 2.5 mg/dL (H)).   Medical History: Past Medical History:  Diagnosis Date  . Allergy   . Anxiety   . Arrhythmia   . Arthritis   . Atrial fibrillation ([place])   . CHF (congestive heart failure) ([place])   . CKD (chronic kidney disease)   . Diabetes mellitus without complication ([place])   . Diabetes mellitus, type II ([place])   . Heart failure ([place])   . Hyperlipidemia   . Hypertension   . Pancreatitis   . Sleep apnea     Medications:  Scheduled:  . chlorhexidine gluconate (MEDLINE KIT)  15 mL Mouth Rinse BID  . famotidine  20 mg Per Tube BID  . feeding supplement (GLUCERNA 1.5 CAL)  1,000 mL Per Tube Q24H  . feeding supplement (PRO-STAT SUGAR FREE 64)  60 mL Per Tube 5 X Daily  . furosemide  20 mg Intravenous Daily  . mouth rinse  15 mL Mouth Rinse 10 times per day  . metoprolol tartrate  12.5 mg Oral BID  . multivitamin  15 mL Per Tube Daily  . senna-docusate  2 tablet Per Tube BID   Infusions:  . sodium chloride    . amiodarone 30 mg/hr (04/11/18 2139)  . fentaNYL infusion INTRAVENOUS 225 mcg/hr (04/12/18 0019)  . heparin 1,800 Units/hr  (04/12/18 0018)  . insulin (NOVOLIN-R) infusion 11.4 Units/hr (04/12/18 0530)  . levETIRAcetam Stopped (04/12/18 0110)  . norepinephrine (LEVOPHED) Adult infusion Stopped (04/11/18 0131)  . propofol (DIPRIVAN) infusion 10 mcg/kg/min (04/12/18 0021)  . vancomycin Stopped (04/12/18 0048)    Assessment: Pharmacy consulted for warfarin and heparin drip management for 47 yo male admitted to the ICU s/p cardiac arrest. Patient has history significant for aortic valve replacement, atrial fibrillation and CHF. Patient currently requiring amiodarone infusion and blood pressure support from norepinephrine. Head CT is negative for infarction or hemorrhage.   Patient takes warfarin 46m daily as an outpatient for goal INR 2.5-3.5.   Heparin infusing at 1600 units/hr.   Goal of Therapy:  INR 2-3 Heparin level 0.3-0.7 units/ml Monitor platelets by anticoagulation protocol: Yes   Plan:  06/05 @ 0430 HL 0.37 therapeutic. Will continue rate of 1800 units/hr and will recheck anti-Xa w/ am labs.  DTobie Lords PharmD, BCPS Clinical Pharmacist 04/12/2018

## 2018-04-12 NOTE — Progress Notes (Addendum)
Inpatient Diabetes Program Recommendations  AACE/ADA: New Consensus Statement on Inpatient Glycemic Control (2015)  Target Ranges:  Prepandial:   less than 140 mg/dL      Peak postprandial:   less than 180 mg/dL (1-2 hours)      Critically ill patients:  140 - 180 mg/dL   Results for ALIJA, RIANO (MRN [id number]) as of 04/12/2018 10:21  Ref. Range 04/11/2018 00:36 04/11/2018 01:35 04/11/2018 02:38 04/11/2018 03:38 04/11/2018 04:37 04/11/2018 05:38 04/11/2018 06:30 04/11/2018 07:22 04/11/2018 11:37 04/11/2018 15:27 04/11/2018 19:48 04/11/2018 23:34  Glucose-Capillary Latest Ref Range: 65 - 99 mg/dL 409 (H)  IV Insulin Drip 126 (H)  IV Insulin Drip 110 (H)  IV Insulin Drip 121 (H)  IV Insulin Drip 128 (H)  IV Insulin Drip 123 (H)  IV Insulin Drip 121 (H)  20 units LEVEMIR given 130 (H)  3 units NOVOLOG +  IV Insulin Drip Stopped 180 (H)  6 units NOVOLOG  203 (H)  9 units NOVOLOG  210 (H)  9 units NOVOLOG  272 (H)   Results for MAGDALENO, LORTIE (MRN [id number]) as of 04/12/2018 10:21  Ref. Range 04/12/2018 01:16 04/12/2018 02:22 04/12/2018 03:26 04/12/2018 04:29 04/12/2018 05:30 04/12/2018 06:31 04/12/2018 07:35 04/12/2018 08:31 04/12/2018 10:12  Glucose-Capillary Latest Ref Range: 65 - 99 mg/dL 956 (H)  IV Insulin Drip Restarted 299 (H)  IV Insulin Drip 305 (H)  IV Insulin Drip 299 (H)  IV Insulin Drip 288 (H)  IV Insulin Drip 299 (H)  IV Insulin Drip 291 (H)  IV Insulin Drip 238 (H)  IV Insulin Drip 253 (H)  IV Insulin Drip    Admit: Back Pain/ Cardiac Arrest during MRI  History: DM, CKD, CHF  Home DM Meds: Levemir 65 units BID                              Novolog 40 units TID                              Amaryl 2 mg daily  Current Orders: Novolog Moderate Correction Scale/ SSI (0-15 units) Q4 hours     Patient saw his PCP Dr. Shirlee Latch on 03/29/2018.  At that visit, A1c was rechecked and found to be 11.7% on lab draw.    Patient was referred to Endocrinology since he  is requiring high doses of insulin and is not being successful with glucose control.  Not sure which practice patient was referred to.     Note patient was transitioned off the IV Insulin drip yesterday around 6am.  Was only given 20 units of Levemir to transition.  Per records, patient takes a large amount of Levemir insulin at home (Levemir 65 units BID).  Also takes a large amount of Novolog at home as well.  Remains Intubated and is currently receiving Glucerna Tube Feedings at 20cc/hr.  Note that the IV Insulin drip was restarted at 1am today per orders from Dr. Sung Amabile.  Orders are for the ICU Glycemic Control Protocol.     MD--Please make sure to wait to transition patient back to SQ insulin using the parameters for the ICU Glycemic Control Protocol (see Sidebar for guidelines-  Tube feeds at goal rate, Insulin Drip rate <4 units/hour, and patient has had 6 subsequent CBGs <180 mg/dl in a row).  Please keep in mind that patient may require a much larger dose of  Levemir than the Transition orders call for since patient takes such a large dose of Levemir at home.  May consider a custom order for Levemir at time of transition when ready.  Could consider Levemir 45 units BID.  This would be about 70% the total dose Levemir patient takes at home.  Please maintain patient on the IV Insulin drip until ready to transition.     --Will follow patient during hospitalization--  Ambrose FinlandJeannine Johnston Lania Zawistowski RN, MSN, CDE Diabetes Coordinator Inpatient Glycemic Control Team Team Pager: [phone] (8a-5p)

## 2018-04-12 NOTE — Progress Notes (Signed)
[facility] [place] [facility]   PATIENT NAME: Alexander Alexander    MR#:  [id number]  DATE OF BIRTH:  Dec 09, 1970  SUBJECTIVE:   Patient intubated and on the ventilator.  FiO2 90% PEEP 7 ,febrile and tachy Patient is arousable on 2 sedatives propofol and fentanyl REVIEW OF SYSTEMS:   Review of Systems  Unable to perform ROS: Intubated   Tolerating Diet:Tube feeding Tolerating PT: intubated  DRUG ALLERGIES:   Allergies  Allergen Reactions  . Lexapro [Escitalopram Oxalate] Swelling    VITALS:  Blood pressure 116/79, pulse (!) 109, temperature (!) 100.9 F (38.3 C), temperature source Oral, resp. rate 20, height 5\' 10"  (1.778 m), weight (!) 181.7 kg (400 lb 9.2 oz), SpO2 90 %.  PHYSICAL EXAMINATION:   Physical Exam  GENERAL:  47 y.o.-year-old patient lying in the bed with no acute distress. Morbidly obese EYES: Pupils equal, round, reactive to light and accommodation. No scleral icterus.  HEENT: Head atraumatic, normocephalic. Oropharynx and nasopharynx clear. Intubated and on the vent NECK:  Supple, no jugular venous distention. No thyroid enlargement, no tenderness.  LUNGS: Normal breath sounds bilaterally, no wheezing, rales, rhonchi. No use of accessory muscles of respiration.  CARDIOVASCULAR: S1, S2 normal. No murmurs, rubs, or gallops.  ABDOMEN: Soft, nontender, nondistended. Bowel sounds present. No organomegaly or mass.  EXTREMITIES: No cyanosis, clubbing or edema b/l.    NEUROLOGIC: unable to assess --pt intubated PSYCHIATRIC: intubated  SKIN: No obvious rash, lesion, or ulcer.   LABORATORY PANEL:  CBC Recent Labs  Lab 04/10/18 0421  WBC 9.4  HGB 15.7  HCT 47.2  PLT 173    Chemistries  Recent Labs  Lab 04/12/18 0421  NA 138  K 4.8  CL 99*  CO2 27  GLUCOSE 301*  BUN 44*  CREATININE 2.50*  CALCIUM 8.6*  MG 2.0  AST 83*  ALT 37  ALKPHOS 48  BILITOT 1.6*   Cardiac Enzymes Recent Labs  Lab 04/08/18 0427  TROPONINI  0.16*   RADIOLOGY:  Dg Chest Port 1 View  Result Date: 04/12/2018 CLINICAL DATA:  Dyspnea. Endotracheal tube present. Status post cardiac arrest. EXAM: PORTABLE CHEST 1 VIEW COMPARISON:  04/10/2018 FINDINGS: Endotracheal tube, right jugular central venous catheter, and nasogastric tube remain in appropriate position. Heart size is stable. Low lung volumes are seen. Atelectasis again seen in both lung bases. No new or worsening areas of pulmonary opacity are seen. No evidence of pulmonary consolidation or pneumothorax. IMPRESSION: No significant change in low lung volumes and bibasilar atelectasis. Electronically Signed   By: Myles Rosenthal M.D.   On: 04/12/2018 10:07   Dg Chest Port 1 View  Result Date: 04/10/2018 CLINICAL DATA:  Acute respiratory failure EXAM: PORTABLE CHEST 1 VIEW COMPARISON:  Earlier today at 1004 hours FINDINGS: Mildly degraded exam due to AP portable technique and patient body habitus. Endotracheal tube terminates 3.3 cm above carina.Prior median sternotomy. Nasogastric tube not well visualized distally but likely extends beyond the inferior aspect of the film. Right internal jugular line tip at low SVC. Cardiomegaly accentuated by AP portable technique. Small bilateral pleural effusions. No pneumothorax. Mild interstitial edema, superimposed upon low lung volumes. Bibasilar airspace disease. IMPRESSION: Decreased sensitivity and specificity exam due to technique related factors, as described above. Cardiomegaly with mild congestive heart failure, low lung volumes, and layering bilateral pleural effusions. Bibasilar airspace disease which is most likely atelectasis. Electronically Signed   By: Jeronimo Greaves M.D.   On: 04/10/2018 19:42  ASSESSMENT AND PLAN:  Delia HeadyOtis Staffa  is a 47 y.o. male who presents with complaint of low back pain.  Patient is a known history of sciatica, medicated for similar complaint of low back pain with radiation down his legs.  Initially he told ED physician and  the pain was much worse than it has been for him, and so MRI was ordered.  While patient was in radiology department waiting to start MRI he had a cardiac arrest.   *Acute respiratory failure with hypoxemia initially thought to be from cardiac arrest [facility](HCC) - - He was intubated during his code event, and is being sent to the ICU for further treatment.    follow with intensivist currently FiO2-90% vent management by intensivist Discussed with intensivist  *fever Tylenol Pan cx coolong blanket vanc  *Pulseless electric activity after back pain possibly consistent with a respiratory event rather than a cardiac event per cardiology -Patient is hemodynamically stable -Echocardiogram with 55 to 60% ejection fraction -cardiology  following discussed with Dr. Gwen PoundsKowalski  *  Chronic combined systolic (congestive) and diastolic (congestive) heart failure (HCC) -also potentially causing some arrhythmias, including his ventricular rhythm which may have been responsible for his cardiac arrest.  He had significant runs of V. tach in the ED postarrest.  -Patient is off IV amiodarone.  *Coagulopathy with Warfarin and subtherapeutic INR. S/p Mechanical aortic valve replacement.  Currently on heparin drip  * AKI on  CKD (chronic kidney disease) stage 3, with hyperkalemia  Hyperkalemia resolved  creatinine at 2.45  Was volume, avoid nephrotoxins and monitor urine output, will consider nephrology consult if needed  *Elevated LFTs-probably acute phase reactants monitor closely   *HTN (hypertension) -hold home dose antihypertensives for now as the antiarrhythmics and his code event have dropped his blood pressure.  Patient is currently on IV pressors  *  T2DM (type 2 diabetes mellitus) (HCC) -sliding scale insulin with corresponding glucose checks  *  Atrial fibrillation (HCC) -continue rate controlling medications as well as anticoagulation with IV heparin, consider changing back over to warfarin with  INR goal between 2-3  *  Aortic valve replaced -continue anticoagulation with IV heparin and consider changing back over to warfarin with INR goal between 2-3  *  OSA (obstructive sleep apnea) -currently intubated, once he is extubated he will need CPAP nightly   *  Hyperlipidemia associated with type 2 diabetes mellitus (HCC) -continue home dose antilipid  Spoke with mom and wife  Case discussed with Care Management/Social Worker. Management plans discussed with the patient, wife at bedside and they are in agreement.  CODE STATUS: full  DVT Prophylaxis: heparin gtt  TOTAL TIME TAKING CARE OF THIS PATIENT: 33 minutes.  >50% time spent on counselling and coordination of care  POSSIBLE D/C IN few DAYS, DEPENDING ON CLINICAL CONDITION.  Note: This dictation was prepared with Dragon dictation along with smaller phrase technology. Any transcriptional errors that result from this process are unintentional.  Ramonita LabAruna Reaghan Kawa M.D on 04/12/2018 at 4:07 PM  Between 7am to 6pm - Pager - [phone]  After 6pm go to [email or web address] - Social research officer, governmentpassword EPAS [facility]  [facility]  Office  [phone]  CC: Primary care physician; Erasmo DownerBacigalupo, Angela M, MDPatient ID: Alexander Alexander, male   DOB: [birth year], 47 y.o.   MRN: [id number]

## 2018-04-12 NOTE — Care Management (Signed)
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

## 2018-04-12 NOTE — Progress Notes (Signed)
Follow up - Critical Care Medicine Note  Patient Details:    Alexander Alexander is an 47 y.o. male. 47 year old gentleman with past medical history of atrial fibrillation, status post ablation and aortic valve replacement on warfarin, history of congestive heart failure, chronic renal disease, morbid obesity, diabetes, hyperlipidemia, obstructive sleep apnea and hypertension, suffered a pulseless V. Tach cardiac arrest in MRI when he was being evaluated for back pain and sciatica, subsequently intubated in the intensive care unit.  Lines, Airways, Drains: Airway 7.5 mm (Active)  Secured at (cm) 24 cm 04/10/2018  4:16 AM  Measured From Lips 04/10/2018  4:16 AM  Secured Location Right 04/10/2018  4:16 AM  Secured By [employer] 04/10/2018  4:16 AM  Tube Holder Repositioned Yes 04/10/2018  2:21 AM  Cuff Pressure (cm H2O) 26 cm H2O 04/09/2018  6:11 PM  Site Condition Dry 04/10/2018  4:16 AM     CVC Triple Lumen 04/07/18 Right External jugular (Active)  Indication for Insertion or Continuance of Line Vasoactive infusions;Prolonged intravenous therapies 04/09/2018  8:00 PM  Site Assessment Clean;Dry;Intact 04/09/2018  8:00 PM  Proximal Lumen Status Infusing;Flushed 04/09/2018  8:00 PM  Medial Lumen Status Infusing;Flushed 04/09/2018  8:00 PM  Distal Lumen Status Infusing;Flushed 04/09/2018  8:00 PM  Dressing Type Transparent 04/09/2018  8:00 PM  Dressing Status Clean;Dry;Intact;Antimicrobial disc in place 04/09/2018  8:00 PM  Line Care Connections checked and tightened 04/09/2018  8:00 PM  Dressing Intervention Dressing changed;Antimicrobial disc changed 04/10/2018  4:31 AM  Dressing Change Due 04/17/18 04/10/2018  4:31 AM     NG/OG Tube Orogastric 18 Fr. Right mouth Aucultation 55 cm (Active)  Cm Marking at Nare/Corner of Mouth (if applicable) 55 cm 04/10/2018  4:16 AM  Site Assessment Clean;Dry;Intact 04/10/2018  4:16 AM  Ongoing Placement Verification No change in cm markings or external length of tube from initial  placement;No change in respiratory status;No acute changes, not attributed to clinical condition 04/10/2018  4:16 AM  Status Infusing tube feed 04/10/2018  4:16 AM  Intake (mL) 30 mL 04/08/2018  5:00 PM     Urethral Catheter Alexander Alexander probe 16 Fr. (Active)  Indication for Insertion or Continuance of Catheter Other (comment) 04/10/2018  4:16 AM  Site Assessment Clean;Intact;Dry 04/10/2018  4:16 AM  Catheter Maintenance Bag below level of bladder;Catheter secured;Drainage bag/tubing not touching floor;Seal intact;No dependent loops;Insertion date on drainage bag;Bag emptied prior to transport 04/10/2018  4:16 AM  Collection Container Standard drainage bag 04/10/2018  4:16 AM  Securement Method Securing device (Describe) 04/10/2018  4:16 AM  Urinary Catheter Interventions Unclamped 04/10/2018  4:16 AM  Output (mL) 75 mL 04/10/2018  5:41 AM    Anti-infectives:  Anti-infectives (From admission, onward)   Start     Dose/Rate Route Frequency Ordered Stop   04/11/18 2000  vancomycin (VANCOCIN) 1,250 mg in sodium chloride 0.9 % 250 mL IVPB     1,250 mg 166.7 mL/hr over 90 Minutes Intravenous Every 18 hours 04/11/18 1548     04/10/18 1400  vancomycin (VANCOCIN) 1,250 mg in sodium chloride 0.9 % 250 mL IVPB  Status:  Discontinued     1,250 mg 166.7 mL/hr over 90 Minutes Intravenous Every 12 hours 04/10/18 1149 04/11/18 1548   04/10/18 1200  Ampicillin-Sulbactam (UNASYN) 3 g in sodium chloride 0.9 % 100 mL IVPB  Status:  Discontinued     3 g 200 mL/hr over 30 Minutes Intravenous Every 6 hours 04/10/18 0851 04/10/18 1131   04/09/18 1800  vancomycin (  VANCOCIN) 1,500 mg in sodium chloride 0.9 % 500 mL IVPB  Status:  Discontinued     1,500 mg 250 mL/hr over 120 Minutes Intravenous Every 18 hours 04/09/18 1303 04/10/18 0750   04/09/18 1045  vancomycin (VANCOCIN) IVPB 1000 mg/200 mL premix     1,000 mg 200 mL/hr over 60 Minutes Intravenous  Once 04/09/18 1041 04/09/18 1307   04/07/18 1400  piperacillin-tazobactam  (ZOSYN) IVPB 3.375 g  Status:  Discontinued     3.375 g 12.5 mL/hr over 240 Minutes Intravenous Every 8 hours 04/07/18 1015 04/10/18 0851      Microbiology: Results for orders placed or performed during the hospital encounter of 04/07/18  MRSA PCR Screening     Status: None   Collection Time: 04/07/18  9:49 AM  Result Value Ref Range Status   MRSA by PCR NEGATIVE NEGATIVE Final    Comment:        The GeneXpert MRSA Assay (FDA approved for NASAL specimens only), is one component of a comprehensive MRSA colonization surveillance program. It is not intended to diagnose MRSA infection nor to guide or monitor treatment for MRSA infections. Performed at [facility], [address] [place], [place] [address]   Culture, respiratory (NON-Expectorated)     Status: None   Collection Time: 04/08/18 10:53 AM  Result Value Ref Range Status   Specimen Description   Final    TRACHEAL ASPIRATE Performed at [facility], [address] [place], [place] [address]    Special Requests   Final    Normal Performed at [facility], [address] [place], [place] [address]    Gram Stain   Final    ABUNDANT WBC PRESENT, PREDOMINANTLY PMN NO SQUAMOUS EPITHELIAL CELLS SEEN MODERATE GRAM POSITIVE COCCI IN CLUSTERS FEW GRAM NEGATIVE COCCOBACILLI RARE GRAM POSITIVE RODS Performed at [facility], [address] [place], [place] [address]    Culture   Final    ABUNDANT METHICILLIN RESISTANT STAPHYLOCOCCUS AUREUS   Report Status 04/10/2018 FINAL  Final   Organism ID, Bacteria METHICILLIN RESISTANT STAPHYLOCOCCUS AUREUS  Final      Susceptibility   Methicillin resistant staphylococcus aureus - MIC*    CIPROFLOXACIN >=8 RESISTANT Resistant     ERYTHROMYCIN >=8 RESISTANT Resistant     GENTAMICIN <=0.5 SENSITIVE Sensitive     OXACILLIN >=4 RESISTANT Resistant     TETRACYCLINE <=1 SENSITIVE Sensitive     VANCOMYCIN 1 SENSITIVE Sensitive     TRIMETH/SULFA  <=10 SENSITIVE Sensitive     CLINDAMYCIN <=0.25 SENSITIVE Sensitive     RIFAMPIN <=0.5 SENSITIVE Sensitive     Inducible Clindamycin NEGATIVE Sensitive     * ABUNDANT METHICILLIN RESISTANT STAPHYLOCOCCUS AUREUS  CULTURE, BLOOD (ROUTINE X 2) w Reflex to ID Panel     Status: None (Preliminary result)   Collection Time: 04/08/18 11:43 AM  Result Value Ref Range Status   Specimen Description BLOOD LAC  Final   Special Requests   Final    BOTTLES DRAWN AEROBIC AND ANAEROBIC Blood Culture adequate volume   Culture   Final    NO GROWTH 4 DAYS Performed at [facility], [address] [place], [place] [address]    Report Status PENDING  Incomplete  CULTURE, BLOOD (ROUTINE X 2) w Reflex to ID Panel     Status: None (Preliminary result)   Collection Time: 04/08/18 11:43 AM  Result Value Ref Range Status   Specimen Description BLOOD LT HAND  Final  Special Requests   Final    BOTTLES DRAWN AEROBIC AND ANAEROBIC Blood Culture adequate volume   Culture   Final    NO GROWTH 4 DAYS Performed at [facility], [address] [place], [place] [address]    Report Status PENDING  Incomplete  Urine Culture     Status: None   Collection Time: 04/08/18  1:16 PM  Result Value Ref Range Status   Specimen Description   Final    URINE, CATHETERIZED Performed at [facility], [address] [place], [place] [address]    Special Requests   Final    Normal Performed at [facility], [address] [place], [place] [address]    Culture   Final    NO GROWTH Performed at [facility], [address] [place], [place] [address]    Report Status 04/09/2018 FINAL  Final   Events: over the last 24 hours patient has had ature spike. Patient still requiring 100% FiO2 and 10 of PEEP Studies: Dg Chest 1 View  Result Date: 04/07/2018 CLINICAL DATA:  47 y/o  M; intubated. EXAM: CHEST  1 VIEW COMPARISON:  12/06/2017 chest radiograph FINDINGS: Stable cardiomegaly  given projection and technique. Transcutaneous pacing pads. Post median sternotomy with wires in alignment. Low lung volumes. Pulmonary vascular congestion. No focal consolidation. Endotracheal tube tip 2.4 cm above the carina. Enteric tube tip appears to project over the lower esophagus, advancement recommended. IMPRESSION: 1. Endotracheal tube tip 2.4 cm above the carina. 2. Cardiomegaly and pulmonary vascular congestion. 3. Enteric tube tip appears to project over the lower esophagus, advancement recommended. Electronically Signed   By: Mitzi Hansen M.D.   On: 04/07/2018 06:22   Ct Head Wo Contrast  Result Date: 04/09/2018 CLINICAL DATA:  47 year old with acute mental status changes after cardiac arrest. Possible new onset seizures. EXAM: CT HEAD WITHOUT CONTRAST TECHNIQUE: Contiguous axial images were obtained from the base of the skull through the vertex without intravenous contrast. COMPARISON:  04/07/2018. FINDINGS: Brain: Ventricular system normal in size and appearance for age. Physiologic calcifications in the basal ganglia bilaterally as noted previously. No mass lesion. No midline shift. No acute hemorrhage or hematoma. No extra-axial fluid collections. No evidence of acute infarction. No evidence of cerebral edema at this time. Vascular: Moderate BILATERAL carotid siphon and vertebrobasilar atherosclerosis. No hyperdense vessel. Skull: No skull fracture or other focal osseous abnormality involving the skull. Sinuses/Orbits: Visualized paranasal sinuses, bilateral mastoid air cells and bilateral middle ear cavities well-aerated. Visualized orbits and globes normal in appearance. Other: None. IMPRESSION: No acute intracranial abnormality. Electronically Signed   By: Hulan Saas M.D.   On: 04/09/2018 11:20   Ct Head Wo Contrast  Result Date: 04/07/2018 CLINICAL DATA:  Cardiac arrest EXAM: CT HEAD WITHOUT CONTRAST TECHNIQUE: Contiguous axial images were obtained from the base of the  skull through the vertex without intravenous contrast. COMPARISON:  None. FINDINGS: Brain: No evidence of acute infarction, hemorrhage, hydrocephalus, extra-axial collection or mass lesion/mass effect. Vascular: No hyperdense vessel or unexpected calcification. Skull: Negative Sinuses/Orbits: Negative Other: None IMPRESSION: Negative CT head Electronically Signed   By: Marlan Palau M.D.   On: 04/07/2018 15:25   Dg Chest Port 1 View  Result Date: 04/10/2018 CLINICAL DATA:  Acute respiratory failure EXAM: PORTABLE CHEST 1 VIEW COMPARISON:  Earlier today at 1004 hours FINDINGS: Mildly degraded exam due to AP portable technique and patient body habitus. Endotracheal tube terminates 3.3 cm above carina.Prior median sternotomy. Nasogastric  tube not well visualized distally but likely extends beyond the inferior aspect of the film. Right internal jugular line tip at low SVC. Cardiomegaly accentuated by AP portable technique. Small bilateral pleural effusions. No pneumothorax. Mild interstitial edema, superimposed upon low lung volumes. Bibasilar airspace disease. IMPRESSION: Decreased sensitivity and specificity exam due to technique related factors, as described above. Cardiomegaly with mild congestive heart failure, low lung volumes, and layering bilateral pleural effusions. Bibasilar airspace disease which is most likely atelectasis. Electronically Signed   By: Jeronimo Greaves M.D.   On: 04/10/2018 19:42   Dg Chest Port 1 View  Result Date: 04/10/2018 CLINICAL DATA:  Hypoxia EXAM: PORTABLE CHEST 1 VIEW COMPARISON:  April 09, 2018 FINDINGS: Endotracheal tube tip is 4.3 cm above the carina. Central catheter tip is at cavoatrial junction. Nasogastric tube tip and side port below the diaphragm. No pneumothorax. There is patchy airspace consolidation in both medial lung bases with small pleural effusions bilaterally. Lungs elsewhere clear. Heart is mildly enlarged with pulmonary vascularity normal. No adenopathy. Patient  is status post median sternotomy. No bone lesions evident. IMPRESSION: Tube and catheter positions as described without pneumothorax. Suspect bibasilar pneumonia. Small pleural effusions. Stable cardiac prominence. Electronically Signed   By: Bretta Bang III M.D.   On: 04/10/2018 10:27   Dg Chest Port 1 View  Result Date: 04/09/2018 CLINICAL DATA:  Pneumonia EXAM: PORTABLE CHEST 1 VIEW COMPARISON:  April 08, 2018 FINDINGS: The ETT is in good position. The distal NG tube terminates below today's film. Stable cardiomegaly. Bibasilar opacities may represent small effusions with underlying atelectasis, stable on the right and mildly increased on the left. The right IJ is stable. No other interval changes. IMPRESSION: 1. Support apparatus as above. 2. Bibasilar opacities may represent small effusions and underlying atelectasis, stable on the right and mildly increased on the left in the interval. Electronically Signed   By: Gerome Sam III M.D   On: 04/09/2018 07:14   Dg Chest Port 1 View  Result Date: 04/08/2018 CLINICAL DATA:  Followup pneumonia. EXAM: PORTABLE CHEST 1 VIEW COMPARISON:  04/07/2018 and older exams. FINDINGS: Lung volumes are low. This accentuates the appearance of vascular congestion and interstitial prominence. There is mild linear atelectasis in the right mid lung, stable from the prior exam. Bilateral lung base opacity is most likely atelectasis. Pneumonia is possible. No overt pulmonary edema. Is Stable changes from prior cardiac surgery. Cardiac silhouette is mildly enlarged. Endotracheal tube, right internal jugular central venous line and nasal/orogastric tube are stable. IMPRESSION: 1. No significant change from the previous day's study. 2. Vascular congestion with interstitial prominence, but without overt pulmonary edema. 3. Basilar atelectasis.  Cannot exclude pneumonia. Electronically Signed   By: Amie Portland M.D.   On: 04/08/2018 07:23   Dg Chest Portable 1 View  Result  Date: 04/07/2018 CLINICAL DATA:  Central catheter placement.  Hypoxia. EXAM: PORTABLE CHEST 1 VIEW COMPARISON:  Apr 07, 2018 study obtained earlier in the day FINDINGS: Right jugular catheter tip is in the superior vena cava near the cavoatrial junction. Endotracheal tube tip is 4.0 cm above the carina. Nasogastric tube tip and side port are below the diaphragm. No pneumothorax. There is bibasilar atelectasis. Lungs elsewhere are clear. 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 No adenopathy. No bone lesions. Patient is status post median sternotomy with aortic valve replacement. IMPRESSION: Tube and catheter positions as described without pneumothorax. There is pulmonary vascular congestion. Bibasilar atelectasis present. No consolidation evident. Electronically Signed  By: Bretta BangWilliam  Woodruff III M.D.   On: 04/07/2018 08:51   Dg Abd Portable 1v  Result Date: 04/10/2018 CLINICAL DATA:  Status post OG tube placement. EXAM: PORTABLE ABDOMEN - 1 VIEW COMPARISON:  Single view of the abdomen 04/07/2018. FINDINGS: OG tube tip  and sideport are both in the stomach. IMPRESSION: OG tube in good position. Electronically Signed   By: Drusilla Kannerhomas  Dalessio M.D.   On: 04/10/2018 14:32   Dg Abd Portable 1v  Result Date: 04/07/2018 CLINICAL DATA:  NG placement EXAM: PORTABLE ABDOMEN - 1 VIEW COMPARISON:  None. FINDINGS: Limited exam due to large patient size and difficulty with positioning. NG tip in the proximal stomach. NG side hole in the distal esophagus. No dilated bowel loops. IMPRESSION: NG tube in the proximal stomach with the side hole in the distal esophagus. Recommend advancing at least 5 cm. Electronically Signed   By: Marlan Palauharles  Clark M.D.   On: 04/07/2018 10:48    Consults: Treatment Team:  Lamar BlinksKowalski, Bruce J, MD Uvaldo RisingSamaan, Maged, MD Anson FretAhern, Antonia B, MD Thana Farreynolds, Leslie, MD   Objective:  Vital signs for last 24 hours: Temp:  [99.3 F (37.4 C)-101.8 F (38.8 C)] 100.9 F (38.3 C) (06/05  0600) Pulse Rate:  [78-143] 97 (06/05 0600) Resp:  [14-29] 21 (06/05 0600) BP: (71-138)/(47-113) 107/59 (06/05 0600) SpO2:  [91 %-97 %] 95 % (06/05 0600) FiO2 (%):  [100 %] 100 % (06/05 0400) Weight:  [400 lb 9.2 oz (181.7 kg)] 400 lb 9.2 oz (181.7 kg) (06/05 0419)  Hemodynamic parameters for last 24 hours:    Intake/Output from previous day: 06/04 0701 - 06/05 0700 In: 2377.1 [I.V.:1643.7; NG/GT:483.3; IV Piggyback:250] Out: 2001 [Urine:2000; Stool:1]  Intake/Output this shift: No intake/output data recorded.  Vent settings for last 24 hours: Vent Mode: PRVC FiO2 (%):  [100 %] 100 % Set Rate:  [20 bmp] 20 bmp Vt Set:  [550 mL] 550 mL PEEP:  [10 cmH20] 10 cmH20 Plateau Pressure:  [27 cmH20-28 cmH20] 27 cmH20  Physical Exam:  Patient is arousable but somnolent. Presently on propofol HEENT: Patient is orally intubated, central line noted, trachea is midline, no accessory muscle utilization Cardiovascular: Atrial fibrillation noted on monitor, irregularly irregular rhythm with ventricular response averaging in the low 100s Pulmonary: Basilar crackles noted Abdominal: Distended abdomen, bowel sounds noted Extremity: 1+ edema noted Neurologic: Patient moves all extremities Cutaneous: No rashes or lesions noted  Assessment/Plan:    Acute respiratory failurewith hypoxemia status post cardiac arrest. combination of volume overload and possible pneumonia. Patient has grown staph species and sputum presently on vancomycin. Has been weaned off of pressors so resuming diuretic therapy as blood pressure will tolerate.   Patient has had a Alexander spike. Will obtain blood cultures, sputum cultures, urine cultures and obtain chest x-ray  Pulseless V. tach cardiac arrest. ECHO LVEF 55-60%.Cardiology arrest was secondary to respiratory issues  Altered mental status. appreciate neurology input. CT scan of the head was negative, no evidence of anoxic brain injury, presently  empirically started on Keppra for possible partial seizure  AKI on CKD. BUN is 44 and creatinine 2.50 this morning, slightly worsethan yesterday's  Hyperglycemia.   Coagulopathy with Warfarin and subtherapeutic INR. S/p Mechanical aortic valve replacement.     Critical Care Total Time 35 minutes  Soraya Paquette 04/12/2018  *Care during the described time interval was provided by me and/or other providers on the critical care team.  I have reviewed this patient's available data, including medical history, events of note, physical  examination and test results as part of my evaluation. Patient ID: Alexander Alexander, male   DOB: [birth year], 47 y.o.   MRN: [id number] Patient ID: Alexander Alexander, male   DOB: [birth year], 47 y.o.   MRN: [id number]

## 2018-04-12 NOTE — Care Management (Signed)
Insurance criteria to consider authorizing LTACH placement would include tracheostomy and potentially 21-day stay. Both [facility] could consider patient.

## 2018-04-12 NOTE — Progress Notes (Signed)
   04/12/18 2136  Vitals  Temp (!) 102.5 F (39.2 C)  Temp Source Bladder   Cooling blanket initiated per Annabelle Harmanana, NP

## 2018-04-12 NOTE — Progress Notes (Signed)
Pharmacy Electrolyte Monitoring Consult:  Pharmacy consulted to assist in monitoring and replacing electrolytes in this 47 y.o. male admitted on 04/07/2018 s/p cardiac arrest and requiring mechanical ventilation.   Patient receiving 20mg  of IV furosemide daily.   Labs:  Sodium (mmol/L)  Date Value  04/12/2018 138  03/29/2018 142   Potassium (mmol/L)  Date Value  04/12/2018 4.8   Magnesium (mg/dL)  Date Value  16/10/960406/03/2018 2.0   Phosphorus (mg/dL)  Date Value  54/09/811906/03/2018 4.2   Calcium (mg/dL)  Date Value  14/78/295606/03/2018 8.6 (L)   Albumin (g/dL)  Date Value  21/30/865706/03/2018 2.9 (L)  12/09/2017 4.2    Assessment/Plan: No further replacement warranted.   Will recheck all electrolytes with am labs.   Pharmacy will continue to monitor and adjust per consult.   Simpson,Michael L,  04/12/2018 4:09 PM

## 2018-04-12 NOTE — Progress Notes (Signed)
Pharmacy Antibiotic Note  Pricilla RiffleOtis Leon Siegert is a 47 y.o. male admitted on 04/07/2018 s/p cardiac arrest started on antibiotics for MRSA Pneumonia Pharmacy has been consulted for Vancomycin dosing. Patient with fevers overnight. Patient panned cultured this am and Cdiff sent this afternoon.   Plan: Vancomycin random level 23. Will transition patient to vancomycin 1250mg  IV Q18hr for goal trough of 15-20. Patient is morbidly obese and with AKI will monitor serum creatinine and will obtain follow up vancomycin level as indicated.   Height: 5\' 10"  (177.8 cm)(based on previous notes) Weight: (!) 400 lb 9.2 oz (181.7 kg) IBW/kg (Calculated) : 73  Temp (24hrs), Avg:100.9 F (38.3 C), Min:99.4 F (37.4 C), Max:101.8 F (38.8 C)  Recent Labs  Lab 04/07/18 0546  04/07/18 1233 04/07/18 1546  04/08/18 1754 04/09/18 0351 04/10/18 0421 04/10/18 1218 04/11/18 0526 04/11/18 1356 04/12/18 0421  WBC 9.4  --   --   --   --   --  9.6 9.4  --   --   --   --   CREATININE 2.50*   < >  --   --    < > 2.33* 2.81* 2.43*  --  2.45*  --  2.50*  LATICACIDVEN  --   --  2.4* 2.9*  --   --   --   --   --   --   --   --   VANCORANDOM  --   --   --   --   --   --   --   --  12  --  23  --    < > = values in this interval not displayed.    Estimated Creatinine Clearance: 59.5 mL/min (A) (by C-G formula based on SCr of 2.5 mg/dL (H)).    Allergies  Allergen Reactions  . Lexapro [Escitalopram Oxalate] Swelling    Antimicrobials this admission: Zosyn 5/31 >> 6/3 Vancomycin 6/2 >> Unasyn 6/3 x 1.   Dose adjustments this admission: 6/3 Vancomycin transitioned to 1250mg  IV Q12hr.  6/4 Vancomycin transitioned to 1250mg  IV Q18hr.   Microbiology results: 5/31 MRSA PCR: neg 6/1 BCx: no growth x 4 days 6/1 UCx: No growth  6/1 Resp Cx/Trach Aspirate: MRSA  6/5 BCx: sent  6/5 UCx: sent  6/5 SputumCx: few gram positive cocci  6/5 CDiff: sent   Thank you for allowing pharmacy to be a part of this patient's  care.  Yolanda BonineHannah Lifsey, PharmD Pharmacy Resident 04/12/2018 3:54 PM

## 2018-04-13 LAB — CULTURE, BLOOD (ROUTINE X 2)
CULTURE: NO GROWTH
Culture: NO GROWTH
Special Requests: ADEQUATE
Special Requests: ADEQUATE

## 2018-04-13 LAB — GLUCOSE, CAPILLARY
GLUCOSE-CAPILLARY: 147 mg/dL — AB (ref 65–99)
GLUCOSE-CAPILLARY: 157 mg/dL — AB (ref 65–99)
GLUCOSE-CAPILLARY: 162 mg/dL — AB (ref 65–99)
GLUCOSE-CAPILLARY: 163 mg/dL — AB (ref 65–99)
GLUCOSE-CAPILLARY: 167 mg/dL — AB (ref 65–99)
GLUCOSE-CAPILLARY: 179 mg/dL — AB (ref 65–99)
GLUCOSE-CAPILLARY: 186 mg/dL — AB (ref 65–99)
GLUCOSE-CAPILLARY: 224 mg/dL — AB (ref 65–99)
Glucose-Capillary: 190 mg/dL — ABNORMAL HIGH (ref 65–99)
Glucose-Capillary: 214 mg/dL — ABNORMAL HIGH (ref 65–99)
Glucose-Capillary: 191 mg/dL — ABNORMAL HIGH (ref 65–99)
Glucose-Capillary: 171 mg/dL — ABNORMAL HIGH (ref 65–99)
Glucose-Capillary: 176 mg/dL — ABNORMAL HIGH (ref 65–99)
Glucose-Capillary: 188 mg/dL — ABNORMAL HIGH (ref 65–99)
Glucose-Capillary: 178 mg/dL — ABNORMAL HIGH (ref 65–99)
Glucose-Capillary: 173 mg/dL — ABNORMAL HIGH (ref 65–99)
Glucose-Capillary: 151 mg/dL — ABNORMAL HIGH (ref 65–99)
Glucose-Capillary: 136 mg/dL — ABNORMAL HIGH (ref 65–99)
Glucose-Capillary: 169 mg/dL — ABNORMAL HIGH (ref 65–99)
Glucose-Capillary: 180 mg/dL — ABNORMAL HIGH (ref 65–99)
Glucose-Capillary: 179 mg/dL — ABNORMAL HIGH (ref 65–99)
Glucose-Capillary: 188 mg/dL — ABNORMAL HIGH (ref 65–99)
Glucose-Capillary: 154 mg/dL — ABNORMAL HIGH (ref 65–99)
Glucose-Capillary: 162 mg/dL — ABNORMAL HIGH (ref 65–99)

## 2018-04-13 LAB — PROTIME-INR
INR: 1.09
PROTHROMBIN TIME: 14 s (ref 11.4–15.2)

## 2018-04-13 LAB — BASIC METABOLIC PANEL
Anion gap: 7 (ref 5–15)
BUN: 52 mg/dL — AB (ref 6–20)
CO2: 28 mmol/L (ref 22–32)
CREATININE: 2.14 mg/dL — AB (ref 0.61–1.24)
Calcium: 8.4 mg/dL — ABNORMAL LOW (ref 8.9–10.3)
Chloride: 103 mmol/L (ref 101–111)
GFR calc Af Amer: 40 mL/min — ABNORMAL LOW (ref >60)
GFR, EST NON AFRICAN AMERICAN: 35 mL/min — AB (ref >60)
GLUCOSE: 170 mg/dL — AB (ref 65–99)
Potassium: 3.9 mmol/L (ref 3.5–5.1)
Sodium: 138 mmol/L (ref 135–145)

## 2018-04-13 LAB — URINE CULTURE: Culture: NO GROWTH

## 2018-04-13 LAB — TRIGLYCERIDES: Triglycerides: 256 mg/dL — ABNORMAL HIGH (ref <150)

## 2018-04-13 LAB — HEPARIN LEVEL (UNFRACTIONATED): HEPARIN UNFRACTIONATED: 0.4 [IU]/mL (ref 0.30–0.70)

## 2018-04-13 MED ORDER — SODIUM CHLORIDE 0.9 % IV SOLN
0.0000 ug/min | INTRAVENOUS | Status: DC
  Administered 2018-04-13: 20 ug/min via INTRAVENOUS
  Filled 2018-04-13 (×2): qty 4

## 2018-04-13 NOTE — Progress Notes (Signed)
[name] NAME: Alexander Alexander    MR#:  [id number]  DATE OF BIRTH:  04/07/1971  SUBJECTIVE:   Patient intubated and on the ventilator.  FiO2 45 % PEEP 10, still febrile and tachycardia improved, wife at bedside Patient is arousable on 2 sedatives propofol and fentanyl Still on amiodarone, heparin drip Neo-Synephrine started on insulin GTT today, REVIEW OF SYSTEMS:   Review of Systems  Unable to perform ROS: Intubated   Tolerating Diet:Tube feeding Tolerating PT: intubated  DRUG ALLERGIES:   Allergies  Allergen Reactions  . Lexapro [Escitalopram Oxalate] Swelling    VITALS:  Blood pressure (!) 81/64, pulse 95, temperature (!) 101.6 F (38.7 C), resp. rate (!) 22, height 5' 10"  (1.778 m), weight (!) 181.7 kg (400 lb 9.2 oz), SpO2 (!) 88 %.  PHYSICAL EXAMINATION:   Physical Exam  GENERAL:  47 y.o.-year-old patient lying in the bed with no acute distress. Morbidly obese EYES: Pupils equal, round, reactive to light and accommodation. No scleral icterus.  HEENT: Head atraumatic, normocephalic. Oropharynx and nasopharynx clear. Intubated and on the vent NECK:  Supple, no jugular venous distention. No thyroid enlargement, no tenderness.  LUNGS: Normal breath sounds bilaterally, no wheezing, rales, rhonchi. No use of accessory muscles of respiration.  CARDIOVASCULAR: S1, S2 normal. No murmurs, rubs, or gallops.  ABDOMEN: Soft, nontender, nondistended. Bowel sounds present. No organomegaly or mass.  EXTREMITIES: No cyanosis, clubbing or edema b/l.    NEUROLOGIC: unable to assess --pt intubated PSYCHIATRIC: intubated  SKIN: No obvious rash, lesion, or ulcer.   LABORATORY PANEL:  CBC Recent Labs  Lab 04/10/18 0421  WBC 9.4  HGB 15.7  HCT 47.2  PLT 173    Chemistries  Recent Labs  Lab 04/12/18 0421 04/13/18 0424  NA 138 138  K 4.8 3.9  CL 99* 103  CO2 27 28  GLUCOSE 301* 170*  BUN 44* 52*  CREATININE  2.50* 2.14*  CALCIUM 8.6* 8.4*  MG 2.0  --   AST 83*  --   ALT 37  --   ALKPHOS 48  --   BILITOT 1.6*  --    Cardiac Enzymes Recent Labs  Lab 04/08/18 0427  TROPONINI 0.16*   RADIOLOGY:  Dg Chest Port 1 View  Result Date: 04/12/2018 CLINICAL DATA:  Dyspnea. Endotracheal tube present. Status post cardiac arrest. EXAM: PORTABLE CHEST 1 VIEW COMPARISON:  04/10/2018 FINDINGS: Endotracheal tube, right jugular central venous catheter, and nasogastric tube remain in appropriate position. Heart size is stable. Low lung volumes are seen. Atelectasis again seen in both lung bases. No new or worsening areas of pulmonary opacity are seen. No evidence of pulmonary consolidation or pneumothorax. IMPRESSION: No significant change in low lung volumes and bibasilar atelectasis. Electronically Signed   By: Earle Gell M.D.   On: 04/12/2018 10:07   ASSESSMENT AND PLAN:  Alexander Alexander  is a 47 y.o. male who presents with complaint of low back pain.  Patient is a known history of sciatica, medicated for similar complaint of low back pain with radiation down his legs.  Initially he told ED physician and the pain was much worse than it has been for him, and so MRI was ordered.  While patient was in radiology department waiting to start MRI he had a cardiac arrest.   *Acute respiratory failure with hypoxemia initially thought to be from cardiac arrest [facility]) - - He was intubated during his code event, and is being  sent to the ICU for further treatment.    follow with intensivist currently FiO2-45% vent management by intensivist Discussed with intensivist  *septic shock -met criteria with fever and tachycardia Not present at the time of admission On Neo-Synephrine Tylenol Pan cx, blood cultures negative, respiratory culture with gram-positive cocci Staphylococcus, urine culture with no growth coolong blanket vanc  *Pulseless electric activity after back pain possibly consistent with a respiratory event rather  than a cardiac event per cardiology -Patient is hemodynamically stable -Echocardiogram with 55 to 60% ejection fraction -cardiology  following discussed with Dr. Nehemiah Massed  *  Chronic combined systolic (congestive) and diastolic (congestive) heart failure ([place]) -also potentially causing some arrhythmias, including his ventricular rhythm which may have been responsible for his cardiac arrest.  He had significant runs of V. tach in the ED postarrest.  -Patient is off IV amiodarone.  *Coagulopathy with Warfarin and subtherapeutic INR. S/p Mechanical aortic valve replacement.  Currently on heparin drip  * AKI on  CKD (chronic kidney disease) stage 3, with hyperkalemia  Hyperkalemia resolved  creatinine at 2.45  Was volume, avoid nephrotoxins and monitor urine output, will consider nephrology consult if needed  *Elevated LFTs-probably acute phase reactants monitor closely   *HTN (hypertension) -hold home dose antihypertensives for now as the antiarrhythmics and his code event have dropped his blood pressure.  Patient is currently on IV pressors  *  T2DM (type 2 diabetes mellitus) ([place]) -sliding scale insulin with corresponding glucose checks  *  Atrial fibrillation (HCC) -continue rate controlling medications as well as anticoagulation with IV heparin, consider changing back over to warfarin with INR goal between 2-3  *  Aortic valve replaced -continue anticoagulation with IV heparin and consider changing back over to warfarin with INR goal between 2-3  *  OSA (obstructive sleep apnea) -currently intubated, once he is extubated he will need CPAP nightly   *  Hyperlipidemia associated with type 2 diabetes mellitus ([place]) -continue home dose antilipid  Spoke with pts wife  Case discussed with Care Management/Social Worker. Management plans discussed with the patient, wife at bedside and they are in agreement.  CODE STATUS: full  DVT Prophylaxis: heparin gtt  TOTAL TIME TAKING CARE  OF THIS PATIENT: 33 minutes.  >50% time spent on counselling and coordination of care  POSSIBLE D/C IN few DAYS, DEPENDING ON CLINICAL CONDITION.  Note: This dictation was prepared with Dragon dictation along with smaller phrase technology. Any transcriptional errors that result from this process are unintentional.  Nicholes Mango M.D on 04/13/2018 at 4:03 PM  Between 7am to 6pm - Pager - [phone]  After 6pm go to [email or web address] - password [email or web address] [place]  Office  [phone]  CC: Primary care physician; Virginia Crews, MDPatient ID: Alexander Alexander, male   DOB: Mar 23, 1971, 47 y.o.   MRN: [id number]

## 2018-04-13 NOTE — Progress Notes (Signed)
[place] for heparin drip management Indication: atrial fibrillation and aortic valve replacement   Allergies  Allergen Reactions  . Lexapro [Escitalopram Oxalate] Swelling    Patient Measurements: Height: 5' 10" (177.8 cm)(based on previous notes) Weight: (!) 400 lb 9.2 oz (181.7 kg) IBW/kg (Calculated) : 73 Heparin Dosing Weight: 111kg  Vital Signs: Temp: 101.5 F (38.6 C) (06/06 1200) Temp Source: Oral (06/06 0345) BP: 129/93 (06/06 1200) Pulse Rate: 109 (06/06 1200)  Labs: Recent Labs    04/11/18 0526 04/12/18 0421 04/13/18 0424  LABPROT 15.0 14.1 14.0  INR 1.19 1.10 1.09  HEPARINUNFRC 0.44 0.37 0.40  CREATININE 2.45* 2.50* 2.14*    Estimated Creatinine Clearance: 69.6 mL/min (A) (by C-G formula based on SCr of 2.14 mg/dL (H)).   Medical History: Past Medical History:  Diagnosis Date  . Allergy   . Anxiety   . Arrhythmia   . Arthritis   . Atrial fibrillation ([place])   . CHF (congestive heart failure) ([place])   . CKD (chronic kidney disease)   . Diabetes mellitus without complication ([place])   . Diabetes mellitus, type II ([place])   . Heart failure ([place])   . Hyperlipidemia   . Hypertension   . Pancreatitis   . Sleep apnea     Medications:  Scheduled:  . chlorhexidine gluconate (MEDLINE KIT)  15 mL Mouth Rinse BID  . famotidine  20 mg Per Tube BID  . feeding supplement (GLUCERNA 1.5 CAL)  1,000 mL Per Tube Q24H  . feeding supplement (PRO-STAT SUGAR FREE 64)  60 mL Per Tube 5 X Daily  . furosemide  20 mg Intravenous Daily  . mouth rinse  15 mL Mouth Rinse 10 times per day  . multivitamin  15 mL Per Tube Daily  . senna-docusate  2 tablet Per Tube BID   Infusions:  . sodium chloride    . amiodarone 60 mg/hr (04/13/18 0910)  . fentaNYL infusion INTRAVENOUS 225 mcg/hr (04/13/18 0910)  . heparin 1,800 Units/hr (04/13/18 0600)  . insulin (NOVOLIN-R) infusion 18.8 Units/hr (04/13/18 1409)  . levETIRAcetam Stopped  (04/13/18 1116)  . phenylephrine (NEO-SYNEPHRINE) Adult infusion 10 mcg/min (04/13/18 1103)  . propofol (DIPRIVAN) infusion 15 mcg/kg/min (04/13/18 1103)  . vancomycin Stopped (04/13/18 1021)    Assessment: Pharmacy consulted for warfarin and heparin drip management for 47 yo male admitted to the ICU s/p cardiac arrest. Patient has history significant for aortic valve replacement, atrial fibrillation and CHF. Patient currently requiring amiodarone infusion and blood pressure support from norepinephrine. Head CT is negative for infarction or hemorrhage.   Patient takes warfarin 39m daily as an outpatient for goal INR 2.5-3.5.   Heparin infusing at 1600 units/hr.   Goal of Therapy:  INR 2-3 Heparin level 0.3-0.7 units/ml Monitor platelets by anticoagulation protocol: Yes   Plan:  Heparin level is therapeutic. Will continue Heparin at 1800 units/hr and will recheck anti-Xa w/ am labs.  MLS 04/13/2018

## 2018-04-13 NOTE — Progress Notes (Signed)
[place] for heparin drip management Indication: atrial fibrillation and aortic valve replacement   Allergies  Allergen Reactions  . Lexapro [Escitalopram Oxalate] Swelling    Patient Measurements: Height: 5' 10"  (177.8 cm)(based on previous notes) Weight: (!) 400 lb 9.2 oz (181.7 kg) IBW/kg (Calculated) : 73 Heparin Dosing Weight: 111kg  Vital Signs: Temp: 98.7 F (37.1 C) (06/06 0345) Temp Source: Oral (06/06 0345) BP: 113/71 (06/06 0600) Pulse Rate: 102 (06/06 0600)  Labs: Recent Labs    04/11/18 0526 04/12/18 0421 04/13/18 0424  LABPROT 15.0 14.1 14.0  INR 1.19 1.10 1.09  HEPARINUNFRC 0.44 0.37 0.40  CREATININE 2.45* 2.50*  --     Estimated Creatinine Clearance: 59.5 mL/min (A) (by C-G formula based on SCr of 2.5 mg/dL (H)).   Medical History: Past Medical History:  Diagnosis Date  . Allergy   . Anxiety   . Arrhythmia   . Arthritis   . Atrial fibrillation ([place])   . CHF (congestive heart failure) ([place])   . CKD (chronic kidney disease)   . Diabetes mellitus without complication ([place])   . Diabetes mellitus, type II ([place])   . Heart failure ([place])   . Hyperlipidemia   . Hypertension   . Pancreatitis   . Sleep apnea     Medications:  Scheduled:  . chlorhexidine gluconate (MEDLINE KIT)  15 mL Mouth Rinse BID  . famotidine  20 mg Per Tube BID  . feeding supplement (GLUCERNA 1.5 CAL)  1,000 mL Per Tube Q24H  . feeding supplement (PRO-STAT SUGAR FREE 64)  60 mL Per Tube 5 X Daily  . furosemide  20 mg Intravenous Daily  . mouth rinse  15 mL Mouth Rinse 10 times per day  . metoprolol tartrate  12.5 mg Oral BID  . multivitamin  15 mL Per Tube Daily  . senna-docusate  2 tablet Per Tube BID   Infusions:  . sodium chloride    . amiodarone 60 mg/hr (04/13/18 0600)  . fentaNYL infusion INTRAVENOUS 225 mcg/hr (04/13/18 0600)  . heparin 1,800 Units/hr (04/13/18 0600)  . insulin (NOVOLIN-R) infusion 23.5 mL/hr at 04/13/18  0600  . levETIRAcetam Stopped (04/12/18 2210)  . phenylephrine (NEO-SYNEPHRINE) Adult infusion 10.133 mcg/min (04/13/18 0600)  . propofol (DIPRIVAN) infusion 19.962 mcg/kg/min (04/13/18 0600)  . vancomycin Stopped (04/12/18 1728)    Assessment: Pharmacy consulted for warfarin and heparin drip management for 47 yo male admitted to the ICU s/p cardiac arrest. Patient has history significant for aortic valve replacement, atrial fibrillation and CHF. Patient currently requiring amiodarone infusion and blood pressure support from norepinephrine. Head CT is negative for infarction or hemorrhage.   Patient takes warfarin 41m daily as an outpatient for goal INR 2.5-3.5.   Heparin infusing at 1600 units/hr.   Goal of Therapy:  INR 2-3 Heparin level 0.3-0.7 units/ml Monitor platelets by anticoagulation protocol: Yes   Plan:  06/05 @ 0430 HL 0.37 therapeutic. Will continue rate of 1800 units/hr and will recheck anti-Xa w/ am labs.  06/06 @ 0430 HL 0.40 therapeutic. Will continue rate of 1800 units/hr and will recheck anti-Xa w/ am labs.  DTobie Lords PharmD, BCPS Clinical Pharmacist 04/13/2018

## 2018-04-13 NOTE — Progress Notes (Signed)
Pharmacy Antibiotic Note  Alexander RiffleOtis Leon Alexander is a 47 y.o. male admitted on 04/07/2018 s/p cardiac arrest started on antibiotics for MRSA Pneumonia Pharmacy has been consulted for Vancomycin dosing. Patient with fevers overnight. Patient panned cultured this am and Cdiff sent this afternoon.   Plan: vancomycin 1250mg  IV Q18hr for goal trough of 15-20. Patient is morbidly obese and with AKI will monitor serum creatinine and will obtain follow up vancomycin level at 0130 on 6/7.  Height: 5\' 10"  (177.8 cm)(based on previous notes) Weight: (!) 400 lb 9.2 oz (181.7 kg) IBW/kg (Calculated) : 73  Temp (24hrs), Avg:100.6 F (38.1 C), Min:98.7 F (37.1 C), Max:102.5 F (39.2 C)  Recent Labs  Lab 04/07/18 0546  04/07/18 1233 04/07/18 1546  04/09/18 0351 04/10/18 0421 04/10/18 1218 04/11/18 0526 04/11/18 1356 04/12/18 0421 04/13/18 0424  WBC 9.4  --   --   --   --  9.6 9.4  --   --   --   --   --   CREATININE 2.50*   < >  --   --    < > 2.81* 2.43*  --  2.45*  --  2.50* 2.14*  LATICACIDVEN  --   --  2.4* 2.9*  --   --   --   --   --   --   --   --   VANCORANDOM  --   --   --   --   --   --   --  12  --  23  --   --    < > = values in this interval not displayed.    Estimated Creatinine Clearance: 69.6 mL/min (A) (by C-G formula based on SCr of 2.14 mg/dL (H)).    Allergies  Allergen Reactions  . Lexapro [Escitalopram Oxalate] Swelling    Antimicrobials this admission: Zosyn 5/31 >> 6/3 Vancomycin 6/2 >> Unasyn 6/3 x 1.   Dose adjustments this admission: 6/3 Vancomycin transitioned to 1250mg  IV Q12hr.  6/4 Vancomycin transitioned to 1250mg  IV Q18hr.   Microbiology results: 5/31 MRSA PCR: neg 6/1 BCx: no growth 6/1 UCx: No growth  6/1 Resp Cx/Trach Aspirate: MRSA  6/5 BCx: no growth <24 hours 6/5 UCx: no growth   6/5 SputumCx: few staph aureus 6/5 CDiff: negative  Thank you for allowing pharmacy to be a part of this patient's care.    MLS 04/13/2018 2:56 PM

## 2018-04-13 NOTE — Progress Notes (Signed)
Pt pulling on ETT. RT at bedside to check placement.  Propofol increased to 6320mcg/kg/min

## 2018-04-13 NOTE — Progress Notes (Signed)
Dr. Sung AmabileSimonds notified of rising temperature despite tylenol and cooling blanket. Per MD continue to monitor patient, no action needed at this time. Trudee KusterBrandi R [place]field

## 2018-04-13 NOTE — Progress Notes (Signed)
Pharmacy Electrolyte Monitoring Consult:  Pharmacy consulted to assist in monitoring and replacing electrolytes in this 47 y.o. male admitted on 04/07/2018 s/p cardiac arrest and requiring mechanical ventilation.   Patient receiving 20mg  of IV furosemide daily and high-dose insulin infusion.  Labs:  Sodium (mmol/L)  Date Value  04/13/2018 138  03/29/2018 142   Potassium (mmol/L)  Date Value  04/13/2018 3.9   Magnesium (mg/dL)  Date Value  16/10/960406/03/2018 2.0   Phosphorus (mg/dL)  Date Value  54/09/811906/03/2018 4.2   Calcium (mg/dL)  Date Value  14/78/295606/04/2018 8.4 (L)   Albumin (g/dL)  Date Value  21/30/865706/03/2018 2.9 (L)  12/09/2017 4.2    Assessment/Plan: No further replacement warranted.   Will recheck all electrolytes with am labs.   Pharmacy will continue to monitor and adjust per consult.   Koleen Nimrodorinne J Oviya Ammar,  04/13/2018 2:54 PM

## 2018-04-13 NOTE — Progress Notes (Signed)
Follow up - Critical Care Medicine Note  Patient Details:    Alexander Alexander is an 47 y.o. male. 47 year old gentleman with past medical history of atrial fibrillation, status post ablation and aortic valve replacement on warfarin, history of congestive heart failure, chronic renal disease, morbid obesity, diabetes, hyperlipidemia, obstructive sleep apnea and hypertension, suffered a pulseless V. Tach cardiac arrest in MRI when he was being evaluated for back pain and sciatica, subsequently intubated in the intensive care unit.  Lines, Airways, Drains: Airway 7.5 mm (Active)  Secured at (cm) 24 cm 04/10/2018  4:16 AM  Measured From Lips 04/10/2018  4:16 AM  Secured Location Right 04/10/2018  4:16 AM  Secured By [employer] 04/10/2018  4:16 AM  Tube Holder Repositioned Yes 04/10/2018  2:21 AM  Cuff Pressure (cm H2O) 26 cm H2O 04/09/2018  6:11 PM  Site Condition Dry 04/10/2018  4:16 AM     CVC Triple Lumen 04/07/18 Right External jugular (Active)  Indication for Insertion or Continuance of Line Vasoactive infusions;Prolonged intravenous therapies 04/09/2018  8:00 PM  Site Assessment Clean;Dry;Intact 04/09/2018  8:00 PM  Proximal Lumen Status Infusing;Flushed 04/09/2018  8:00 PM  Medial Lumen Status Infusing;Flushed 04/09/2018  8:00 PM  Distal Lumen Status Infusing;Flushed 04/09/2018  8:00 PM  Dressing Type Transparent 04/09/2018  8:00 PM  Dressing Status Clean;Dry;Intact;Antimicrobial disc in place 04/09/2018  8:00 PM  Line Care Connections checked and tightened 04/09/2018  8:00 PM  Dressing Intervention Dressing changed;Antimicrobial disc changed 04/10/2018  4:31 AM  Dressing Change Due 04/17/18 04/10/2018  4:31 AM     NG/OG Tube Orogastric 18 Fr. Right mouth Aucultation 55 cm (Active)  Cm Marking at Nare/Corner of Mouth (if applicable) 55 cm 04/10/2018  4:16 AM  Site Assessment Clean;Dry;Intact 04/10/2018  4:16 AM  Ongoing Placement Verification No change in cm markings or external length of tube from initial  placement;No change in respiratory status;No acute changes, not attributed to clinical condition 04/10/2018  4:16 AM  Status Infusing tube feed 04/10/2018  4:16 AM  Intake (mL) 30 mL 04/08/2018  5:00 PM     Urethral Catheter Myra Temperature probe 16 Fr. (Active)  Indication for Insertion or Continuance of Catheter Other (comment) 04/10/2018  4:16 AM  Site Assessment Clean;Intact;Dry 04/10/2018  4:16 AM  Catheter Maintenance Bag below level of bladder;Catheter secured;Drainage bag/tubing not touching floor;Seal intact;No dependent loops;Insertion date on drainage bag;Bag emptied prior to transport 04/10/2018  4:16 AM  Collection Container Standard drainage bag 04/10/2018  4:16 AM  Securement Method Securing device (Describe) 04/10/2018  4:16 AM  Urinary Catheter Interventions Unclamped 04/10/2018  4:16 AM  Output (mL) 75 mL 04/10/2018  5:41 AM    Anti-infectives:  Anti-infectives (From admission, onward)   Start     Dose/Rate Route Frequency Ordered Stop   04/11/18 2000  vancomycin (VANCOCIN) 1,250 mg in sodium chloride 0.9 % 250 mL IVPB     1,250 mg 166.7 mL/hr over 90 Minutes Intravenous Every 18 hours 04/11/18 1548     04/10/18 1400  vancomycin (VANCOCIN) 1,250 mg in sodium chloride 0.9 % 250 mL IVPB  Status:  Discontinued     1,250 mg 166.7 mL/hr over 90 Minutes Intravenous Every 12 hours 04/10/18 1149 04/11/18 1548   04/10/18 1200  Ampicillin-Sulbactam (UNASYN) 3 g in sodium chloride 0.9 % 100 mL IVPB  Status:  Discontinued     3 g 200 mL/hr over 30 Minutes Intravenous Every 6 hours 04/10/18 0851 04/10/18 1131   04/09/18 1800  vancomycin (  VANCOCIN) 1,500 mg in sodium chloride 0.9 % 500 mL IVPB  Status:  Discontinued     1,500 mg 250 mL/hr over 120 Minutes Intravenous Every 18 hours 04/09/18 1303 04/10/18 0750   04/09/18 1045  vancomycin (VANCOCIN) IVPB 1000 mg/200 mL premix     1,000 mg 200 mL/hr over 60 Minutes Intravenous  Once 04/09/18 1041 04/09/18 1307   04/07/18 1400  piperacillin-tazobactam  (ZOSYN) IVPB 3.375 g  Status:  Discontinued     3.375 g 12.5 mL/hr over 240 Minutes Intravenous Every 8 hours 04/07/18 1015 04/10/18 0851      Microbiology: Results for orders placed or performed during the hospital encounter of 04/07/18  MRSA PCR Screening     Status: None   Collection Time: 04/07/18  9:49 AM  Result Value Ref Range Status   MRSA by PCR NEGATIVE NEGATIVE Final    Comment:        The GeneXpert MRSA Assay (FDA approved for NASAL specimens only), is one component of a comprehensive MRSA colonization surveillance program. It is not intended to diagnose MRSA infection nor to guide or monitor treatment for MRSA infections. Performed at [facility], [address] [place], [place] [address]   Culture, respiratory (NON-Expectorated)     Status: None   Collection Time: 04/08/18 10:53 AM  Result Value Ref Range Status   Specimen Description   Final    TRACHEAL ASPIRATE Performed at [facility], [address] [place], [place] [address]    Special Requests   Final    Normal Performed at [facility], [address] [place], [place] [address]    Gram Stain   Final    ABUNDANT WBC PRESENT, PREDOMINANTLY PMN NO SQUAMOUS EPITHELIAL CELLS SEEN MODERATE GRAM POSITIVE COCCI IN CLUSTERS FEW GRAM NEGATIVE COCCOBACILLI RARE GRAM POSITIVE RODS Performed at [facility], [address], [place], [place] [address]    Culture   Final    ABUNDANT METHICILLIN RESISTANT STAPHYLOCOCCUS AUREUS   Report Status 04/10/2018 FINAL  Final   Organism ID, Bacteria METHICILLIN RESISTANT STAPHYLOCOCCUS AUREUS  Final      Susceptibility   Methicillin resistant staphylococcus aureus - MIC*    CIPROFLOXACIN >=8 RESISTANT Resistant     ERYTHROMYCIN >=8 RESISTANT Resistant     GENTAMICIN <=0.5 SENSITIVE Sensitive     OXACILLIN >=4 RESISTANT Resistant     TETRACYCLINE <=1 SENSITIVE Sensitive     VANCOMYCIN 1 SENSITIVE Sensitive     TRIMETH/SULFA  <=10 SENSITIVE Sensitive     CLINDAMYCIN <=0.25 SENSITIVE Sensitive     RIFAMPIN <=0.5 SENSITIVE Sensitive     Inducible Clindamycin NEGATIVE Sensitive     * ABUNDANT METHICILLIN RESISTANT STAPHYLOCOCCUS AUREUS  CULTURE, BLOOD (ROUTINE X 2) w Reflex to ID Panel     Status: None   Collection Time: 04/08/18 11:43 AM  Result Value Ref Range Status   Specimen Description BLOOD LAC  Final   Special Requests   Final    BOTTLES DRAWN AEROBIC AND ANAEROBIC Blood Culture adequate volume   Culture   Final    NO GROWTH 5 DAYS Performed at [facility], [address] [place], [place] [address]    Report Status 04/13/2018 FINAL  Final  CULTURE, BLOOD (ROUTINE X 2) w Reflex to ID Panel     Status: None   Collection Time: 04/08/18 11:43 AM  Result Value Ref Range Status   Specimen Description BLOOD LT HAND  Final   Special Requests  Final    BOTTLES DRAWN AEROBIC AND ANAEROBIC Blood Culture adequate volume   Culture   Final    NO GROWTH 5 DAYS Performed at [facility], [address] [place] [place], [place] [address]    Report Status 04/13/2018 FINAL  Final  Urine Culture     Status: None   Collection Time: 04/08/18  1:16 PM  Result Value Ref Range Status   Specimen Description   Final    URINE, CATHETERIZED Performed at [facility], [address], [place], [place] [address]    Special Requests   Final    Normal Performed at [facility] [address], [place], [place] [address]    Culture   Final    NO GROWTH Performed at [facility], [address] [place], [place] [address]    Report Status 04/09/2018 FINAL  Final  CULTURE, BLOOD (ROUTINE X 2) w Reflex to ID Panel     Status: None (Preliminary result)   Collection Time: 04/12/18  7:30 AM  Result Value Ref Range Status   Specimen Description BLOOD BLOOD LEFT HAND  Final   Special Requests   Final    BOTTLES DRAWN AEROBIC AND ANAEROBIC Blood Culture adequate volume   Culture   Final     NO GROWTH < 24 HOURS Performed at [facility], [address] [place], [place] [address]    Report Status PENDING  Incomplete  CULTURE, BLOOD (ROUTINE X 2) w Reflex to ID Panel     Status: None (Preliminary result)   Collection Time: 04/12/18  7:46 AM  Result Value Ref Range Status   Specimen Description BLOOD BLOOD LEFT HAND  Final   Special Requests   Final    BOTTLES DRAWN AEROBIC AND ANAEROBIC Blood Culture adequate volume   Culture   Final    NO GROWTH < 24 HOURS Performed at [facility], [address] [place], [place] [address]    Report Status PENDING  Incomplete  Culture, respiratory (NON-Expectorated)     Status: None (Preliminary result)   Collection Time: 04/12/18  8:11 AM  Result Value Ref Range Status   Specimen Description   Final    TRACHEAL ASPIRATE Performed at [facility], [address] [place], [place] [address]    Special Requests   Final    NONE Performed at [facility], [address] [place], [place] [address]    Gram Stain   Final    FEW WBC PRESENT, PREDOMINANTLY PMN FEW GRAM POSITIVE COCCI Performed at [facility], [address] [place], [place] [address]    Culture PENDING  Incomplete   Report Status PENDING  Incomplete  C difficile quick scan w PCR reflex     Status: None   Collection Time: 04/12/18  3:05 PM  Result Value Ref Range Status   C Diff antigen NEGATIVE NEGATIVE Final   C Diff toxin NEGATIVE NEGATIVE Final   C Diff interpretation No C. difficile detected.  Final    Comment: Performed at [facility], [address] [place], [place] [address]   Events: over the last 24 hours patient has had ature spike. Patient still requiring 100% FiO2 and 10 of PEEP Studies: Dg Chest 1 View  Result Date: 04/07/2018 CLINICAL DATA:  47 y/o  M; intubated. EXAM: CHEST  1 VIEW COMPARISON:  12/06/2017 chest radiograph FINDINGS: Stable cardiomegaly given projection and technique.  Transcutaneous pacing pads. Post median sternotomy  with wires in alignment. Low lung volumes. Pulmonary vascular congestion. No focal consolidation. Endotracheal tube tip 2.4 cm above the carina. Enteric tube tip appears to project over the lower esophagus, advancement recommended. IMPRESSION: 1. Endotracheal tube tip 2.4 cm above the carina. 2. Cardiomegaly and pulmonary vascular congestion. 3. Enteric tube tip appears to project over the lower esophagus, advancement recommended. Electronically Signed   By: Mitzi HansenLance  Furusawa-Stratton M.D.   On: 04/07/2018 06:22   Ct Head Wo Contrast  Result Date: 04/09/2018 CLINICAL DATA:  47 year old with acute mental status changes after cardiac arrest. Possible new onset seizures. EXAM: CT HEAD WITHOUT CONTRAST TECHNIQUE: Contiguous axial images were obtained from the base of the skull through the vertex without intravenous contrast. COMPARISON:  04/07/2018. FINDINGS: Brain: Ventricular system normal in size and appearance for age. Physiologic calcifications in the basal ganglia bilaterally as noted previously. No mass lesion. No midline shift. No acute hemorrhage or hematoma. No extra-axial fluid collections. No evidence of acute infarction. No evidence of cerebral edema at this time. Vascular: Moderate BILATERAL carotid siphon and vertebrobasilar atherosclerosis. No hyperdense vessel. Skull: No skull fracture or other focal osseous abnormality involving the skull. Sinuses/Orbits: Visualized paranasal sinuses, bilateral mastoid air cells and bilateral middle ear cavities well-aerated. Visualized orbits and globes normal in appearance. Other: None. IMPRESSION: No acute intracranial abnormality. Electronically Signed   By: Hulan Saashomas  Lawrence M.D.   On: 04/09/2018 11:20   Ct Head Wo Contrast  Result Date: 04/07/2018 CLINICAL DATA:  Cardiac arrest EXAM: CT HEAD WITHOUT CONTRAST TECHNIQUE: Contiguous axial images were obtained from the base of the skull through the vertex without  intravenous contrast. COMPARISON:  None. FINDINGS: Brain: No evidence of acute infarction, hemorrhage, hydrocephalus, extra-axial collection or mass lesion/mass effect. Vascular: No hyperdense vessel or unexpected calcification. Skull: Negative Sinuses/Orbits: Negative Other: None IMPRESSION: Negative CT head Electronically Signed   By: Marlan Palauharles  Clark M.D.   On: 04/07/2018 15:25   Dg Chest Port 1 View  Result Date: 04/12/2018 CLINICAL DATA:  Dyspnea. Endotracheal tube present. Status post cardiac arrest. EXAM: PORTABLE CHEST 1 VIEW COMPARISON:  04/10/2018 FINDINGS: Endotracheal tube, right jugular central venous catheter, and nasogastric tube remain in appropriate position. Heart size is stable. Low lung volumes are seen. Atelectasis again seen in both lung bases. No new or worsening areas of pulmonary opacity are seen. No evidence of pulmonary consolidation or pneumothorax. IMPRESSION: No significant change in low lung volumes and bibasilar atelectasis. Electronically Signed   By: Myles RosenthalJohn  Stahl M.D.   On: 04/12/2018 10:07   Dg Chest Port 1 View  Result Date: 04/10/2018 CLINICAL DATA:  Acute respiratory failure EXAM: PORTABLE CHEST 1 VIEW COMPARISON:  Earlier today at 1004 hours FINDINGS: Mildly degraded exam due to AP portable technique and patient body habitus. Endotracheal tube terminates 3.3 cm above carina.Prior median sternotomy. Nasogastric tube not well visualized distally but likely extends beyond the inferior aspect of the film. Right internal jugular line tip at low SVC. Cardiomegaly accentuated by AP portable technique. Small bilateral pleural effusions. No pneumothorax. Mild interstitial edema, superimposed upon low lung volumes. Bibasilar airspace disease. IMPRESSION: Decreased sensitivity and specificity exam due to technique related factors, as described above. Cardiomegaly with mild congestive heart failure, low lung volumes, and layering bilateral pleural effusions. Bibasilar airspace disease  which is most likely atelectasis. Electronically Signed   By: Jeronimo GreavesKyle  Talbot M.D.   On: 04/10/2018 19:42   Dg Chest Port 1 View  Result Date: 04/10/2018 CLINICAL DATA:  Hypoxia EXAM: PORTABLE CHEST  1 VIEW COMPARISON:  April 09, 2018 FINDINGS: Endotracheal tube tip is 4.3 cm above the carina. Central catheter tip is at cavoatrial junction. Nasogastric tube tip and side port below the diaphragm. No pneumothorax. There is patchy airspace consolidation in both medial lung bases with small pleural effusions bilaterally. Lungs elsewhere clear. Heart is mildly enlarged with pulmonary vascularity normal. No adenopathy. Patient is status post median sternotomy. No bone lesions evident. IMPRESSION: Tube and catheter positions as described without pneumothorax. Suspect bibasilar pneumonia. Small pleural effusions. Stable cardiac prominence. Electronically Signed   By: Bretta Bang III M.D.   On: 04/10/2018 10:27   Dg Chest Port 1 View  Result Date: 04/09/2018 CLINICAL DATA:  Pneumonia EXAM: PORTABLE CHEST 1 VIEW COMPARISON:  April 08, 2018 FINDINGS: The ETT is in good position. The distal NG tube terminates below today's film. Stable cardiomegaly. Bibasilar opacities may represent small effusions with underlying atelectasis, stable on the right and mildly increased on the left. The right IJ is stable. No other interval changes. IMPRESSION: 1. Support apparatus as above. 2. Bibasilar opacities may represent small effusions and underlying atelectasis, stable on the right and mildly increased on the left in the interval. Electronically Signed   By: Gerome Sam III M.D   On: 04/09/2018 07:14   Dg Chest Port 1 View  Result Date: 04/08/2018 CLINICAL DATA:  Followup pneumonia. EXAM: PORTABLE CHEST 1 VIEW COMPARISON:  04/07/2018 and older exams. FINDINGS: Lung volumes are low. This accentuates the appearance of vascular congestion and interstitial prominence. There is mild linear atelectasis in the right mid lung, stable  from the prior exam. Bilateral lung base opacity is most likely atelectasis. Pneumonia is possible. No overt pulmonary edema. Is Stable changes from prior cardiac surgery. Cardiac silhouette is mildly enlarged. Endotracheal tube, right internal jugular central venous line and nasal/orogastric tube are stable. IMPRESSION: 1. No significant change from the previous day's study. 2. Vascular congestion with interstitial prominence, but without overt pulmonary edema. 3. Basilar atelectasis.  Cannot exclude pneumonia. Electronically Signed   By: Amie Portland M.D.   On: 04/08/2018 07:23   Dg Chest Portable 1 View  Result Date: 04/07/2018 CLINICAL DATA:  Central catheter placement.  Hypoxia. EXAM: PORTABLE CHEST 1 VIEW COMPARISON:  Apr 07, 2018 study obtained earlier in the day FINDINGS: Right jugular catheter tip is in the superior vena cava near the cavoatrial junction. Endotracheal tube tip is 4.0 cm above the carina. Nasogastric tube tip and side port are below the diaphragm. No pneumothorax. There is bibasilar atelectasis. Lungs elsewhere are clear. 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 No adenopathy. No bone lesions. Patient is status post median sternotomy with aortic valve replacement. IMPRESSION: Tube and catheter positions as described without pneumothorax. There is pulmonary vascular congestion. Bibasilar atelectasis present. No consolidation evident. Electronically Signed   By: Bretta Bang III M.D.   On: 04/07/2018 08:51   Dg Abd Portable 1v  Result Date: 04/10/2018 CLINICAL DATA:  Status post OG tube placement. EXAM: PORTABLE ABDOMEN - 1 VIEW COMPARISON:  Single view of the abdomen 04/07/2018. FINDINGS: OG tube tip  and sideport are both in the stomach. IMPRESSION: OG tube in good position. Electronically Signed   By: Drusilla Kanner M.D.   On: 04/10/2018 14:32   Dg Abd Portable 1v  Result Date: 04/07/2018 CLINICAL DATA:  NG placement EXAM: PORTABLE ABDOMEN - 1 VIEW  COMPARISON:  None. FINDINGS: Limited exam due to large patient size and difficulty with positioning. NG tip in  the proximal stomach. NG side hole in the distal esophagus. No dilated bowel loops. IMPRESSION: NG tube in the proximal stomach with the side hole in the distal esophagus. Recommend advancing at least 5 cm. Electronically Signed   By: Marlan Palau M.D.   On: 04/07/2018 10:48    Consults: Treatment Team:  Lamar Blinks, MD Uvaldo Rising, MD Anson Fret, MD Thana Farr, MD   Objective:  Vital signs for last 24 hours: Temp:  [98.7 F (37.1 C)-102.5 F (39.2 C)] 98.7 F (37.1 C) (06/06 0345) Pulse Rate:  [85-126] 102 (06/06 0600) Resp:  [18-32] 20 (06/06 0600) BP: (74-146)/(53-129) 113/71 (06/06 0600) SpO2:  [89 %-98 %] 93 % (06/06 0600) FiO2 (%):  [80 %-100 %] 80 % (06/06 0500)  Hemodynamic parameters for last 24 hours:    Intake/Output from previous day: 06/05 0701 - 06/06 0700 In: 2629.2 [I.V.:2309.2; NG/GT:320] Out: 2490 [Urine:1940; Stool:550]  Intake/Output this shift: No intake/output data recorded.  Vent settings for last 24 hours: Vent Mode: PRVC FiO2 (%):  [80 %-100 %] 80 % Set Rate:  [20 bmp] 20 bmp Vt Set:  [550 mL] 550 mL PEEP:  [10 cmH20] 10 cmH20  Physical Exam:  Patient is arousable but somnolent. Presently on propofol HEENT: Patient is orally intubated, central line noted, trachea is midline, no accessory muscle utilization Cardiovascular: Atrial fibrillation noted on monitor, irregularly irregular rhythm with ventricular response averaging in the low 100s Pulmonary: Basilar crackles noted Abdominal: Distended abdomen, bowel sounds noted Extremity: 1+ edema noted Neurologic: Patient moves all extremities Cutaneous: No rashes or lesions noted  Assessment/Plan:    Acute respiratory failurewith hypoxemia status post cardiac arrest. combination of volume overload and possible pneumonia. Patient has grown staph species and sputum  presently on vancomycin. Patient has had a temperature spike. Hypoxemia has been slow to improve. Presently on 80% FiO2 and 10 of PEEP. Patient is already on anticoagulation for his aortic valve and I don't feel CT angiogram will add much therapeutic change. We are limited how much we can diary secondary to his blood pressure. He has being treated for pneumonia.  Pulseless V. tach cardiac arrest. ECHO LVEF 55-60%.Cardiology arrest was secondary to respiratory issues  Altered mental status. appreciate neurology input. CT scan of the head was negative, no evidence of anoxic brain injury, presently empirically started on Keppra for possible partial seizure  AKI on CKD. Pending morning laboratory studies  Hyperglycemia. On coverage  S/p Mechanical aortic valve replacement.on anticoagulation   Critical Care Total Time 40 minutes  Syanne Looney 04/13/2018  *Care during the described time interval was provided by me and/or other providers on the critical care team.  I have reviewed this patient's available data, including medical history, events of note, physical examination and test results as part of my evaluation. Patient ID: Alexander Alexander, male   DOB: 02-20-1971, 47 y.o.   MRN: [id number] Patient ID: Alexander Alexander, male   DOB: 1971-10-09, 47 y.o.   MRN: [id number] Patient ID: Alexander Alexander, male   DOB: [birth year], 47 y.o.   MRN: [id number]

## 2018-04-14 ENCOUNTER — Inpatient Hospital Stay: Payer: Medicare HMO

## 2018-04-14 LAB — VANCOMYCIN, TROUGH: Vancomycin Tr: 16 ug/mL (ref 15–20)

## 2018-04-14 LAB — GLUCOSE, CAPILLARY
GLUCOSE-CAPILLARY: 148 mg/dL — AB (ref 65–99)
GLUCOSE-CAPILLARY: 155 mg/dL — AB (ref 65–99)
GLUCOSE-CAPILLARY: 156 mg/dL — AB (ref 65–99)
GLUCOSE-CAPILLARY: 171 mg/dL — AB (ref 65–99)
GLUCOSE-CAPILLARY: 175 mg/dL — AB (ref 65–99)
GLUCOSE-CAPILLARY: 179 mg/dL — AB (ref 65–99)
GLUCOSE-CAPILLARY: 185 mg/dL — AB (ref 65–99)
GLUCOSE-CAPILLARY: 329 mg/dL — AB (ref 65–99)
Glucose-Capillary: 156 mg/dL — ABNORMAL HIGH (ref 65–99)
Glucose-Capillary: 166 mg/dL — ABNORMAL HIGH (ref 65–99)
Glucose-Capillary: 168 mg/dL — ABNORMAL HIGH (ref 65–99)
Glucose-Capillary: 171 mg/dL — ABNORMAL HIGH (ref 65–99)
Glucose-Capillary: 173 mg/dL — ABNORMAL HIGH (ref 65–99)
Glucose-Capillary: 207 mg/dL — ABNORMAL HIGH (ref 65–99)
Glucose-Capillary: 257 mg/dL — ABNORMAL HIGH (ref 65–99)
Glucose-Capillary: 325 mg/dL — ABNORMAL HIGH (ref 65–99)

## 2018-04-14 LAB — BASIC METABOLIC PANEL
Anion gap: 9 (ref 5–15)
BUN: 54 mg/dL — AB (ref 6–20)
CO2: 29 mmol/L (ref 22–32)
CREATININE: 1.89 mg/dL — AB (ref 0.61–1.24)
Calcium: 8.2 mg/dL — ABNORMAL LOW (ref 8.9–10.3)
Chloride: 103 mmol/L (ref 101–111)
GFR calc non Af Amer: 40 mL/min — ABNORMAL LOW (ref >60)
GFR, EST AFRICAN AMERICAN: 47 mL/min — AB (ref >60)
Glucose, Bld: 201 mg/dL — ABNORMAL HIGH (ref 65–99)
Potassium: 3.7 mmol/L (ref 3.5–5.1)
SODIUM: 141 mmol/L (ref 135–145)

## 2018-04-14 LAB — CBC
HCT: 43.6 % (ref 40.0–52.0)
Hemoglobin: 14.3 g/dL (ref 13.0–18.0)
MCH: 28.1 pg (ref 26.0–34.0)
MCHC: 32.8 g/dL (ref 32.0–36.0)
MCV: 85.6 fL (ref 80.0–100.0)
PLATELETS: 168 10*3/uL (ref 150–440)
RBC: 5.09 MIL/uL (ref 4.40–5.90)
RDW: 18.4 % — AB (ref 11.5–14.5)
WBC: 15.5 10*3/uL — AB (ref 3.8–10.6)

## 2018-04-14 LAB — HEPARIN LEVEL (UNFRACTIONATED)
HEPARIN UNFRACTIONATED: 0.26 [IU]/mL — AB (ref 0.30–0.70)
Heparin Unfractionated: 0.28 IU/mL — ABNORMAL LOW (ref 0.30–0.70)
Heparin Unfractionated: 0.41 IU/mL (ref 0.30–0.70)

## 2018-04-14 LAB — PROTIME-INR
INR: 1.23
PROTHROMBIN TIME: 15.4 s — AB (ref 11.4–15.2)

## 2018-04-14 MED ORDER — VANCOMYCIN HCL IN DEXTROSE 1-5 GM/200ML-% IV SOLN
1000.0000 mg | Freq: Two times a day (BID) | INTRAVENOUS | Status: DC
  Administered 2018-04-14 – 2018-04-16 (×4): 1000 mg via INTRAVENOUS
  Filled 2018-04-14 (×6): qty 200

## 2018-04-14 MED ORDER — INSULIN DETEMIR 100 UNIT/ML ~~LOC~~ SOLN
45.0000 [IU] | Freq: Two times a day (BID) | SUBCUTANEOUS | Status: DC
  Filled 2018-04-14 (×2): qty 0.45

## 2018-04-14 MED ORDER — INSULIN ASPART 100 UNIT/ML ~~LOC~~ SOLN
3.0000 [IU] | SUBCUTANEOUS | Status: DC
  Administered 2018-04-14: 9 [IU] via SUBCUTANEOUS
  Administered 2018-04-14: 6 [IU] via SUBCUTANEOUS
  Filled 2018-04-14 (×3): qty 1

## 2018-04-14 MED ORDER — INSULIN DETEMIR 100 UNIT/ML ~~LOC~~ SOLN
50.0000 [IU] | Freq: Two times a day (BID) | SUBCUTANEOUS | Status: DC
  Administered 2018-04-14 (×2): 50 [IU] via SUBCUTANEOUS
  Filled 2018-04-14 (×3): qty 0.5

## 2018-04-14 MED ORDER — SODIUM CHLORIDE 0.9% FLUSH
10.0000 mL | Freq: Two times a day (BID) | INTRAVENOUS | Status: DC
  Administered 2018-04-14 – 2018-04-18 (×9): 10 mL

## 2018-04-14 MED ORDER — INSULIN ASPART 100 UNIT/ML ~~LOC~~ SOLN
8.0000 [IU] | SUBCUTANEOUS | Status: DC
  Administered 2018-04-14: 8 [IU] via SUBCUTANEOUS
  Filled 2018-04-14 (×2): qty 1

## 2018-04-14 MED ORDER — INSULIN ASPART 100 UNIT/ML ~~LOC~~ SOLN
4.0000 [IU] | SUBCUTANEOUS | Status: DC
  Administered 2018-04-14: 4 [IU] via SUBCUTANEOUS
  Administered 2018-04-14: 8 [IU] via SUBCUTANEOUS
  Filled 2018-04-14: qty 1

## 2018-04-14 MED ORDER — HEPARIN BOLUS VIA INFUSION
1700.0000 [IU] | Freq: Once | INTRAVENOUS | Status: AC
  Administered 2018-04-14: 1700 [IU] via INTRAVENOUS
  Filled 2018-04-14: qty 1700

## 2018-04-14 MED ORDER — HEPARIN BOLUS VIA INFUSION
1750.0000 [IU] | Freq: Once | INTRAVENOUS | Status: AC
Start: 2018-04-14 — End: 2018-04-14
  Administered 2018-04-14: 1750 [IU] via INTRAVENOUS
  Filled 2018-04-14: qty 1750

## 2018-04-14 MED ORDER — SODIUM CHLORIDE 0.9% FLUSH: 10.0000 mL | INTRAVENOUS | Status: DC | PRN

## 2018-04-14 MED ORDER — INSULIN ASPART 100 UNIT/ML ~~LOC~~ SOLN
11.0000 [IU] | Freq: Once | SUBCUTANEOUS | Status: AC
  Administered 2018-04-14: 11 [IU] via SUBCUTANEOUS

## 2018-04-14 NOTE — Care Management (Addendum)
Referral received for LTAC.Spoke with Dr. Lonn Georgiaonforti. He states patient will be ready later next week.  Spoke with wife Aurther Lofterry at bedside regarding LTAC and what to expect. She is agreeable to proceed. [facility] is able to accept patient. Insurance authorization started by [place] with [facility]. Irving Burtonmily to come and speak further with wife regarding what to expect at [facility].

## 2018-04-14 NOTE — Progress Notes (Signed)
Pharmacy Electrolyte Monitoring Consult:  Pharmacy consulted to assist in monitoring and replacing electrolytes in this 47 y.o. male admitted on 04/07/2018 s/p cardiac arrest and requiring mechanical ventilation.   Patient receiving 20mg  of IV furosemide daily  Labs:  Sodium (mmol/L)  Date Value  04/14/2018 141  03/29/2018 142   Potassium (mmol/L)  Date Value  04/14/2018 3.7   Magnesium (mg/dL)  Date Value  28/41/324406/03/2018 2.0   Phosphorus (mg/dL)  Date Value  01/02/725306/03/2018 4.2   Calcium (mg/dL)  Date Value  66/44/034706/05/2018 8.2 (L)   Albumin (g/dL)  Date Value  42/59/563806/03/2018 2.9 (L)  12/09/2017 4.2    Assessment/Plan: No further replacement warranted.   Will recheck all electrolytes with am labs.   Pharmacy will continue to monitor and adjust per consult.   Simpson,Michael L,  04/14/2018 7:55 PM

## 2018-04-14 NOTE — Progress Notes (Signed)
Follow up - Critical Care Medicine Note  Patient Details:    Alexander Alexander is an 47 y.o. male. 47 year old gentleman with past medical history of atrial fibrillation, status post ablation and aortic valve replacement on warfarin, history of congestive heart failure, chronic renal disease, morbid obesity, diabetes, hyperlipidemia, obstructive sleep apnea and hypertension, suffered a pulseless V. Tach cardiac arrest in MRI when he was being evaluated for back pain and sciatica, subsequently intubated in the intensive care unit.  Lines, Airways, Drains: Airway 7.5 mm (Active)  Secured at (cm) 24 cm 04/10/2018  4:16 AM  Measured From Lips 04/10/2018  4:16 AM  Secured Location Right 04/10/2018  4:16 AM  Secured By [employer] 04/10/2018  4:16 AM  Tube Holder Repositioned Yes 04/10/2018  2:21 AM  Cuff Pressure (cm H2O) 26 cm H2O 04/09/2018  6:11 PM  Site Condition Dry 04/10/2018  4:16 AM     CVC Triple Lumen 04/07/18 Right External jugular (Active)  Indication for Insertion or Continuance of Line Vasoactive infusions;Prolonged intravenous therapies 04/09/2018  8:00 PM  Site Assessment Clean;Dry;Intact 04/09/2018  8:00 PM  Proximal Lumen Status Infusing;Flushed 04/09/2018  8:00 PM  Medial Lumen Status Infusing;Flushed 04/09/2018  8:00 PM  Distal Lumen Status Infusing;Flushed 04/09/2018  8:00 PM  Dressing Type Transparent 04/09/2018  8:00 PM  Dressing Status Clean;Dry;Intact;Antimicrobial disc in place 04/09/2018  8:00 PM  Line Care Connections checked and tightened 04/09/2018  8:00 PM  Dressing Intervention Dressing changed;Antimicrobial disc changed 04/10/2018  4:31 AM  Dressing Change Due 04/17/18 04/10/2018  4:31 AM     NG/OG Tube Orogastric 18 Fr. Right mouth Aucultation 55 cm (Active)  Cm Marking at Nare/Corner of Mouth (if applicable) 55 cm 04/10/2018  4:16 AM  Site Assessment Clean;Dry;Intact 04/10/2018  4:16 AM  Ongoing Placement Verification No change in cm markings or external length of tube from initial  placement;No change in respiratory status;No acute changes, not attributed to clinical condition 04/10/2018  4:16 AM  Status Infusing tube feed 04/10/2018  4:16 AM  Intake (mL) 30 mL 04/08/2018  5:00 PM     Urethral Catheter Myra Temperature probe 16 Fr. (Active)  Indication for Insertion or Continuance of Catheter Other (comment) 04/10/2018  4:16 AM  Site Assessment Clean;Intact;Dry 04/10/2018  4:16 AM  Catheter Maintenance Bag below level of bladder;Catheter secured;Drainage bag/tubing not touching floor;Seal intact;No dependent loops;Insertion date on drainage bag;Bag emptied prior to transport 04/10/2018  4:16 AM  Collection Container Standard drainage bag 04/10/2018  4:16 AM  Securement Method Securing device (Describe) 04/10/2018  4:16 AM  Urinary Catheter Interventions Unclamped 04/10/2018  4:16 AM  Output (mL) 75 mL 04/10/2018  5:41 AM    Anti-infectives:  Anti-infectives (From admission, onward)   Start     Dose/Rate Route Frequency Ordered Stop   04/11/18 2000  vancomycin (VANCOCIN) 1,250 mg in sodium chloride 0.9 % 250 mL IVPB     1,250 mg 166.7 mL/hr over 90 Minutes Intravenous Every 18 hours 04/11/18 1548     04/10/18 1400  vancomycin (VANCOCIN) 1,250 mg in sodium chloride 0.9 % 250 mL IVPB  Status:  Discontinued     1,250 mg 166.7 mL/hr over 90 Minutes Intravenous Every 12 hours 04/10/18 1149 04/11/18 1548   04/10/18 1200  Ampicillin-Sulbactam (UNASYN) 3 g in sodium chloride 0.9 % 100 mL IVPB  Status:  Discontinued     3 g 200 mL/hr over 30 Minutes Intravenous Every 6 hours 04/10/18 0851 04/10/18 1131   04/09/18 1800  vancomycin (  VANCOCIN) 1,500 mg in sodium chloride 0.9 % 500 mL IVPB  Status:  Discontinued     1,500 mg 250 mL/hr over 120 Minutes Intravenous Every 18 hours 04/09/18 1303 04/10/18 0750   04/09/18 1045  vancomycin (VANCOCIN) IVPB 1000 mg/200 mL premix     1,000 mg 200 mL/hr over 60 Minutes Intravenous  Once 04/09/18 1041 04/09/18 1307   04/07/18 1400  piperacillin-tazobactam  (ZOSYN) IVPB 3.375 g  Status:  Discontinued     3.375 g 12.5 mL/hr over 240 Minutes Intravenous Every 8 hours 04/07/18 1015 04/10/18 0851      Microbiology: Results for orders placed or performed during the hospital encounter of 04/07/18  MRSA PCR Screening     Status: None   Collection Time: 04/07/18  9:49 AM  Result Value Ref Range Status   MRSA by PCR NEGATIVE NEGATIVE Final    Comment:        The GeneXpert MRSA Assay (FDA approved for NASAL specimens only), is one component of a comprehensive MRSA colonization surveillance program. It is not intended to diagnose MRSA infection nor to guide or monitor treatment for MRSA infections. Performed at [facility], [address] [place], [place] [address]   Culture, respiratory (NON-Expectorated)     Status: None   Collection Time: 04/08/18 10:53 AM  Result Value Ref Range Status   Specimen Description   Final    TRACHEAL ASPIRATE Performed at [facility], [address] [place], [place] [address]    Special Requests   Final    Normal Performed at [facility], [address] [place], [place] [address]    Gram Stain   Final    ABUNDANT WBC PRESENT, PREDOMINANTLY PMN NO SQUAMOUS EPITHELIAL CELLS SEEN MODERATE GRAM POSITIVE COCCI IN CLUSTERS FEW GRAM NEGATIVE COCCOBACILLI RARE GRAM POSITIVE RODS Performed at [facility], [address] [place], [place] [address]    Culture   Final    ABUNDANT METHICILLIN RESISTANT STAPHYLOCOCCUS AUREUS   Report Status 04/10/2018 FINAL  Final   Organism ID, Bacteria METHICILLIN RESISTANT STAPHYLOCOCCUS AUREUS  Final      Susceptibility   Methicillin resistant staphylococcus aureus - MIC*    CIPROFLOXACIN >=8 RESISTANT Resistant     ERYTHROMYCIN >=8 RESISTANT Resistant     GENTAMICIN <=0.5 SENSITIVE Sensitive     OXACILLIN >=4 RESISTANT Resistant     TETRACYCLINE <=1 SENSITIVE Sensitive     VANCOMYCIN 1 SENSITIVE Sensitive     TRIMETH/SULFA  <=10 SENSITIVE Sensitive     CLINDAMYCIN <=0.25 SENSITIVE Sensitive     RIFAMPIN <=0.5 SENSITIVE Sensitive     Inducible Clindamycin NEGATIVE Sensitive     * ABUNDANT METHICILLIN RESISTANT STAPHYLOCOCCUS AUREUS  CULTURE, BLOOD (ROUTINE X 2) w Reflex to ID Panel     Status: None   Collection Time: 04/08/18 11:43 AM  Result Value Ref Range Status   Specimen Description BLOOD LAC  Final   Special Requests   Final    BOTTLES DRAWN AEROBIC AND ANAEROBIC Blood Culture adequate volume   Culture   Final    NO GROWTH 5 DAYS Performed at [facility], [address] [place], [place] [address]    Report Status 04/13/2018 FINAL  Final  CULTURE, BLOOD (ROUTINE X 2) w Reflex to ID Panel     Status: None   Collection Time: 04/08/18 11:43 AM  Result Value Ref Range Status   Specimen Description BLOOD LT HAND  Final   Special Requests  Final    BOTTLES DRAWN AEROBIC AND ANAEROBIC Blood Culture adequate volume   Culture   Final    NO GROWTH 5 DAYS Performed at [facility], [address] [place], [place] [address]    Report Status 04/13/2018 FINAL  Final  Urine Culture     Status: None   Collection Time: 04/08/18  1:16 PM  Result Value Ref Range Status   Specimen Description   Final    URINE, CATHETERIZED Performed at [facility], [address] [place], [place] [address]    Special Requests   Final    Normal Performed at [facility], [address] [place], [place] [address]    Culture   Final    NO GROWTH Performed at [facility], [address] [place], [place] [address]    Report Status 04/09/2018 FINAL  Final  Urine Culture     Status: None   Collection Time: 04/12/18  6:55 AM  Result Value Ref Range Status   Specimen Description   Final    URINE, RANDOM Performed at [facility], [address], [place], [place] [address]    Special Requests   Final    NONE Performed at [facility], [address] [place], [place] [address]    Culture   Final    NO GROWTH Performed at [facility], [address], [place], [place] [address]    Report Status 04/13/2018 FINAL  Final  CULTURE, BLOOD (ROUTINE X 2) w Reflex to ID Panel     Status: None (Preliminary result)   Collection Time: 04/12/18  7:30 AM  Result Value Ref Range Status   Specimen Description BLOOD BLOOD LEFT HAND  Final   Special Requests   Final    BOTTLES DRAWN AEROBIC AND ANAEROBIC Blood Culture adequate volume   Culture   Final    NO GROWTH 2 DAYS Performed at [facility], [address] [place], [place] [address]    Report Status PENDING  Incomplete  CULTURE, BLOOD (ROUTINE X 2) w Reflex to ID Panel     Status: None (Preliminary result)   Collection Time: 04/12/18  7:46 AM  Result Value Ref Range Status   Specimen Description BLOOD BLOOD LEFT HAND  Final   Special Requests   Final    BOTTLES DRAWN AEROBIC AND ANAEROBIC Blood Culture adequate volume   Culture   Final    NO GROWTH 2 DAYS Performed at [facility], [address], [place], [place] [address]    Report Status PENDING  Incomplete  Culture, respiratory (NON-Expectorated)     Status: None (Preliminary result)   Collection Time: 04/12/18  8:11 AM  Result Value Ref Range Status   Specimen Description   Final    TRACHEAL ASPIRATE Performed at [facility], [address], [place], [place] [address]    Special Requests   Final    NONE Performed at [facility], [address] [place], [place] [address]    Gram Stain   Final    FEW WBC PRESENT, PREDOMINANTLY PMN FEW GRAM POSITIVE COCCI    Culture   Final    FEW STAPHYLOCOCCUS AUREUS SUSCEPTIBILITIES TO FOLLOW Performed at [facility], [address], [place], [place] [address]    Report Status PENDING  Incomplete  C difficile quick scan w PCR reflex     Status: None   Collection Time: 04/12/18  3:05  PM  Result Value Ref Range Status   C Diff  antigen NEGATIVE NEGATIVE Final   C Diff toxin NEGATIVE NEGATIVE Final   C Diff interpretation No C. difficile detected.  Final    Comment: Performed at [facility], [address] [place], [place] [address]   Events: over the last 24 hours patient has had ature spike. Patient still requiring 100% FiO2 and 10 of PEEP Studies: Dg Chest 1 View  Result Date: 04/07/2018 CLINICAL DATA:  47 y/o  M; intubated. EXAM: CHEST  1 VIEW COMPARISON:  12/06/2017 chest radiograph FINDINGS: Stable cardiomegaly given projection and technique. Transcutaneous pacing pads. Post median sternotomy with wires in alignment. Low lung volumes. Pulmonary vascular congestion. No focal consolidation. Endotracheal tube tip 2.4 cm above the carina. Enteric tube tip appears to project over the lower esophagus, advancement recommended. IMPRESSION: 1. Endotracheal tube tip 2.4 cm above the carina. 2. Cardiomegaly and pulmonary vascular congestion. 3. Enteric tube tip appears to project over the lower esophagus, advancement recommended. Electronically Signed   By: Mitzi HansenLance  Furusawa-Stratton M.D.   On: 04/07/2018 06:22   Ct Head Wo Contrast  Result Date: 04/09/2018 CLINICAL DATA:  [age] year old with acute mental status changes after cardiac arrest. Possible new onset seizures. EXAM: CT HEAD WITHOUT CONTRAST TECHNIQUE: Contiguous axial images were obtained from the base of the skull through the vertex without intravenous contrast. COMPARISON:  04/07/2018. FINDINGS: Brain: Ventricular system normal in size and appearance for age. Physiologic calcifications in the basal ganglia bilaterally as noted previously. No mass lesion. No midline shift. No acute hemorrhage or hematoma. No extra-axial fluid collections. No evidence of acute infarction. No evidence of cerebral edema at this time. Vascular: Moderate BILATERAL carotid siphon and vertebrobasilar atherosclerosis. No hyperdense vessel. Skull: No skull fracture or other focal osseous  abnormality involving the skull. Sinuses/Orbits: Visualized paranasal sinuses, bilateral mastoid air cells and bilateral middle ear cavities well-aerated. Visualized orbits and globes normal in appearance. Other: None. IMPRESSION: No acute intracranial abnormality. Electronically Signed   By: Hulan Saashomas  Lawrence M.D.   On: 04/09/2018 11:20   Ct Head Wo Contrast  Result Date: 04/07/2018 CLINICAL DATA:  Cardiac arrest EXAM: CT HEAD WITHOUT CONTRAST TECHNIQUE: Contiguous axial images were obtained from the base of the skull through the vertex without intravenous contrast. COMPARISON:  None. FINDINGS: Brain: No evidence of acute infarction, hemorrhage, hydrocephalus, extra-axial collection or mass lesion/mass effect. Vascular: No hyperdense vessel or unexpected calcification. Skull: Negative Sinuses/Orbits: Negative Other: None IMPRESSION: Negative CT head Electronically Signed   By: Marlan Palauharles  Clark M.D.   On: 04/07/2018 15:25   Dg Chest Port 1 View  Result Date: 04/12/2018 CLINICAL DATA:  Dyspnea. Endotracheal tube present. Status post cardiac arrest. EXAM: PORTABLE CHEST 1 VIEW COMPARISON:  04/10/2018 FINDINGS: Endotracheal tube, right jugular central venous catheter, and nasogastric tube remain in appropriate position. Heart size is stable. Low lung volumes are seen. Atelectasis again seen in both lung bases. No new or worsening areas of pulmonary opacity are seen. No evidence of pulmonary consolidation or pneumothorax. IMPRESSION: No significant change in low lung volumes and bibasilar atelectasis. Electronically Signed   By: Myles RosenthalJohn  Stahl M.D.   On: 04/12/2018 10:07   Dg Chest Port 1 View  Result Date: 04/10/2018 CLINICAL DATA:  Acute respiratory failure EXAM: PORTABLE CHEST 1 VIEW COMPARISON:  Earlier today at 1004 hours FINDINGS: Mildly degraded exam due to AP portable technique and patient body habitus. Endotracheal tube terminates 3.3 cm above carina.Prior median sternotomy. Nasogastric tube not  well  visualized distally but likely extends beyond the inferior aspect of the film. Right internal jugular line tip at low SVC. Cardiomegaly accentuated by AP portable technique. Small bilateral pleural effusions. No pneumothorax. Mild interstitial edema, superimposed upon low lung volumes. Bibasilar airspace disease. IMPRESSION: Decreased sensitivity and specificity exam due to technique related factors, as described above. Cardiomegaly with mild congestive heart failure, low lung volumes, and layering bilateral pleural effusions. Bibasilar airspace disease which is most likely atelectasis. Electronically Signed   By: Jeronimo Greaves M.D.   On: 04/10/2018 19:42   Dg Chest Port 1 View  Result Date: 04/10/2018 CLINICAL DATA:  Hypoxia EXAM: PORTABLE CHEST 1 VIEW COMPARISON:  April 09, 2018 FINDINGS: Endotracheal tube tip is 4.3 cm above the carina. Central catheter tip is at cavoatrial junction. Nasogastric tube tip and side port below the diaphragm. No pneumothorax. There is patchy airspace consolidation in both medial lung bases with small pleural effusions bilaterally. Lungs elsewhere clear. Heart is mildly enlarged with pulmonary vascularity normal. No adenopathy. Patient is status post median sternotomy. No bone lesions evident. IMPRESSION: Tube and catheter positions as described without pneumothorax. Suspect bibasilar pneumonia. Small pleural effusions. Stable cardiac prominence. Electronically Signed   By: Bretta Bang III M.D.   On: 04/10/2018 10:27   Dg Chest Port 1 View  Result Date: 04/09/2018 CLINICAL DATA:  Pneumonia EXAM: PORTABLE CHEST 1 VIEW COMPARISON:  April 08, 2018 FINDINGS: The ETT is in good position. The distal NG tube terminates below today's film. Stable cardiomegaly. Bibasilar opacities may represent small effusions with underlying atelectasis, stable on the right and mildly increased on the left. The right IJ is stable. No other interval changes. IMPRESSION: 1. Support apparatus as above. 2.  Bibasilar opacities may represent small effusions and underlying atelectasis, stable on the right and mildly increased on the left in the interval. Electronically Signed   By: Gerome Sam III M.D   On: 04/09/2018 07:14   Dg Chest Port 1 View  Result Date: 04/08/2018 CLINICAL DATA:  Followup pneumonia. EXAM: PORTABLE CHEST 1 VIEW COMPARISON:  04/07/2018 and older exams. FINDINGS: Lung volumes are low. This accentuates the appearance of vascular congestion and interstitial prominence. There is mild linear atelectasis in the right mid lung, stable from the prior exam. Bilateral lung base opacity is most likely atelectasis. Pneumonia is possible. No overt pulmonary edema. Is Stable changes from prior cardiac surgery. Cardiac silhouette is mildly enlarged. Endotracheal tube, right internal jugular central venous line and nasal/orogastric tube are stable. IMPRESSION: 1. No significant change from the previous day's study. 2. Vascular congestion with interstitial prominence, but without overt pulmonary edema. 3. Basilar atelectasis.  Cannot exclude pneumonia. Electronically Signed   By: Amie Portland M.D.   On: 04/08/2018 07:23   Dg Chest Portable 1 View  Result Date: 04/07/2018 CLINICAL DATA:  Central catheter placement.  Hypoxia. EXAM: PORTABLE CHEST 1 VIEW COMPARISON:  Apr 07, 2018 study obtained earlier in the day FINDINGS: Right jugular catheter tip is in the superior vena cava near the cavoatrial junction. Endotracheal tube tip is 4.0 cm above the carina. Nasogastric tube tip and side port are below the diaphragm. No pneumothorax. There is bibasilar atelectasis. Lungs elsewhere are clear. 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 No adenopathy. No bone lesions. Patient is status post median sternotomy with aortic valve replacement. IMPRESSION: Tube and catheter positions as described without pneumothorax. There is pulmonary vascular congestion. Bibasilar atelectasis present. No  consolidation evident. Electronically Signed   By:  Bretta Bang III M.D.   On: 04/07/2018 08:51   Dg Abd Portable 1v  Result Date: 04/10/2018 CLINICAL DATA:  Status post OG tube placement. EXAM: PORTABLE ABDOMEN - 1 VIEW COMPARISON:  Single view of the abdomen 04/07/2018. FINDINGS: OG tube tip  and sideport are both in the stomach. IMPRESSION: OG tube in good position. Electronically Signed   By: Drusilla Kanner M.D.   On: 04/10/2018 14:32   Dg Abd Portable 1v  Result Date: 04/07/2018 CLINICAL DATA:  NG placement EXAM: PORTABLE ABDOMEN - 1 VIEW COMPARISON:  None. FINDINGS: Limited exam due to large patient size and difficulty with positioning. NG tip in the proximal stomach. NG side hole in the distal esophagus. No dilated bowel loops. IMPRESSION: NG tube in the proximal stomach with the side hole in the distal esophagus. Recommend advancing at least 5 cm. Electronically Signed   By: Marlan Palau M.D.   On: 04/07/2018 10:48    Consults: Treatment Team:  Lamar Blinks, MD Uvaldo Rising, MD Anson Fret, MD Thana Farr, MD   Objective:  Vital signs for last 24 hours: Temp:  [97.9 F (36.6 C)-101.8 F (38.8 C)] 99.6 F (37.6 C) (06/07 0455) Pulse Rate:  [93-220] 113 (06/07 0600) Resp:  [18-25] 20 (06/07 0600) BP: (78-139)/(57-93) 79/57 (06/07 0600) SpO2:  [87 %-95 %] 88 % (06/07 0600) FiO2 (%):  [40 %-60 %] 50 % (06/07 0420)  Hemodynamic parameters for last 24 hours:    Intake/Output from previous day: 06/06 0701 - 06/07 0700 In: 4209.1 [I.V.:2899.1; NG/GT:560; IV Piggyback:750] Out: 2460 [Urine:2110; Stool:350]  Intake/Output this shift: No intake/output data recorded.  Vent settings for last 24 hours: Vent Mode: PRVC FiO2 (%):  [40 %-60 %] 50 % Set Rate:  [20 bmp] 20 bmp Vt Set:  [550 mL] 550 mL PEEP:  [10 cmH20] 10 cmH20  Physical Exam:  Patient is arousable but somnolent. Presently on propofol HEENT: Patient is orally intubated, central line  noted, trachea is midline, no accessory muscle utilization Cardiovascular: Atrial fibrillation noted on monitor, irregularly irregular rhythm with ventricular response averaging in the low 100s Pulmonary: Basilar crackles noted Abdominal: Distended abdomen, bowel sounds noted Extremity: 1+ edema noted Neurologic: Patient moves all extremities Cutaneous: No rashes or lesions noted  Assessment/Plan:    Acute respiratory failurewith hypoxemia status post cardiac arrest. combination of volume overload and possible pneumonia. Patient has grown staph species and sputum presently on vancomycin. Had improved status yesterday. Was able to decrease FiO2 down to 50%. Patient is awake alert and communicating.  Pulseless V. tach cardiac arrest. ECHO LVEF 55-60%.Cardiology arrest was secondary to respiratory issues  Altered mental status. appreciate neurology input. CT scan of the head was negative, no evidence of anoxic brain injury, presently empirically started on Keppra for possible partial seizure  AKI on CKD.BUNs 54/creatinine 1.89, creatinine improved from prior study  Hyperglycemia. Patient has been on insulin drip, will try to convert to home regimen  S/p Mechanical aortic valve replacement.on anticoagulation   Critical Care Total Time 40 minutes  Khyrin Trevathan 04/14/2018  *Care during the described time interval was provided by me and/or other providers on the critical care team.  I have reviewed this patient's available data, including medical history, events of note, physical examination and test results as part of my evaluation. Patient ID: Simuel Stebner, male   DOB: 1971/02/01, 47 y.o.   MRN: [id number] Patient ID: Kaimana Lurz, male   DOB: [birth year], 47 y.o.   MRN:  [id number] Patient ID: Demarlo Riojas, male   DOB: 1971-01-08, 47 y.o.   MRN: [id number]

## 2018-04-14 NOTE — Progress Notes (Signed)
Pharmacy Antibiotic Note  Alexander Alexander. Patient with fevers overnight up to 102.19F. Patient sputum cultures continue to show few staph aureus, otherwise no growth in blood or urine cultures. Vancomycin trough is 16 on 6/7 after 4 doses of 1250mg  IV q18h. Serum creatinine levels are improving and trending down, currently at 1.89. Will adjust vancomycin dose to goal trough 15-20 and continue to monitor.  Plan: Vancomycin 1000 IV q12h for goal trough of 15-20. Patient is morbidly obese and with AKI will monitor serum creatinine and will obtain follow up vancomycin level 6/9 at 1400 before fourth dose.  Height: 5\' 10"  (177.8 cm)(based on previous notes) Weight: (!) 400 lb 9.2 oz (181.7 kg) IBW/kg (Calculated) : 73  Temp (24hrs), Avg:100.4 F (38 C), Min:97.9 F (36.6 C), Max:101.8 F (38.8 C)  Recent Labs  Lab 04/07/18 1546  04/09/18 0351 04/10/18 0421 04/10/18 1218 04/11/18 0526 04/11/18 1356 04/12/18 0421 04/13/18 0424 04/14/18 0103 04/14/18 0501  WBC  --   --  9.6 9.4  --   --   --   --   --   --  15.5*  CREATININE  --    < > 2.81* 2.43*  --  2.45*  --  2.50* 2.14*  --  1.89*  LATICACIDVEN 2.9*  --   --   --   --   --   --   --   --   --   --   VANCOTROUGH  --   --   --   --   --   --   --   --   --  16  --   VANCORANDOM  --   --   --   --  12  --  23  --   --   --   --    < > = values in this interval not displayed.    Estimated Creatinine Clearance: 78.8 mL/min (A) (by C-G formula based on SCr of 1.89 mg/dL (H)).    Allergies  Allergen Reactions  . Lexapro [Escitalopram Oxalate] Swelling    Antimicrobials this admission: Zosyn 5/31 >> 6/3 Vancomycin 6/2 >> Unasyn 6/3 x 1.   Dose adjustments this admission: 6/3 Vancomycin transitioned to 1250mg  IV Q12hr.  6/4 Vancomycin transitioned to 1250mg  IV Q18hr.  6/7  Vancomycin transitioned to 1000mg  IV Q12hr.  Microbiology results: 5/31 MRSA PCR: negative 6/1 BCx: no growth 6/1 UCx: no growth  6/1 Resp Cx/Trach Aspirate: MRSA  6/5 BCx: no growth x2 days 6/5 UCx: no growth   6/5 SputumCx: few staph aureus 6/5 CDiff: negative  Thank you for allowing pharmacy to be a part of this patient's care.    Alexander Alexander 04/14/2018 1:15 PM

## 2018-04-14 NOTE — Progress Notes (Addendum)
Inpatient Diabetes Program Recommendations  AACE/ADA: New Consensus Statement on Inpatient Glycemic Control (2015)  Target Ranges:  Prepandial:   less than 140 mg/dL      Peak postprandial:   less than 180 mg/dL (1-2 hours)      Critically ill patients:  140 - 180 mg/dL   Results for Alexander Alexander, Alexander Alexander (MRN [id number]) as of 04/14/2018 07:47  Ref. Range 04/13/2018 23:14 04/14/2018 00:03 04/14/2018 00:57 04/14/2018 02:11 04/14/2018 03:14 04/14/2018 04:17 04/14/2018 07:22  Glucose-Capillary Latest Ref Range: 65 - 99 mg/dL 409163 (H) 811179 (H) 914168 (H) 175 (H) 171 (H) 173 (H) 171 (H)    HomeDM Meds: Levemir 65unitsBID  Novolog 40 unitsTID  Amaryl 2 mg daily  CurrentOrders:IV Insulin Drip     Remains Intubated and is currently receiving Glucerna Tube Feedings at 20cc/hr.  CBGs are stable at present, however, Insulin Drip rates are extremely high and average Insulin drip rate since Midnight is 22.8 units/hour.   12am- 24.3 units/hr 1am- 22 units/hr 2am- 23.5 units/hr 3am- 22.6 units/hr 4am- 23.1 units/hr 5am- 18 units/hr 6am- 26.5 units/hr 7am- 23.1 units/hr  Per the ICU Glycemic Control Protocol Transition guidelines, it is not recommended to transition off the IV Insulin drip until Insulin Drip rates are 4 units/hour or less.  May want to leave on the IV Insulin drip until patient Extubated.  If, however, a decision is made to try to attempt transition, recommend using Custom Transition orders based on how much insulin patient takes at home.  Could try the following: Levemir 50 units BID (80% home dose of Levemir) Novolog Resistant SSI Q4 hours per the ICU Protocol Transition orders Novolog Tube Feed Coverage 4 units Q4 hours (Do Not order tube feed coverage if patient is Extubated or Tube feeds discontinued at time of transition)     --Will follow patient during hospitalization--  Ambrose FinlandJeannine Johnston Alanee Ting RN, MSN, CDE Diabetes  Coordinator Inpatient Glycemic Control Team Team Pager: [phone] (8a-5p)

## 2018-04-14 NOTE — Progress Notes (Signed)
[place] for heparin drip management Indication: atrial fibrillation and aortic valve replacement   Allergies  Allergen Reactions  . Lexapro [Escitalopram Oxalate] Swelling    Patient Measurements: Height: '5\' 10"'$  (177.8 cm)(based on previous notes) Weight: (!) 400 lb 9.2 oz (181.7 kg) IBW/kg (Calculated) : 73 Heparin Dosing Weight: 111kg  Vital Signs: Temp: 100.6 F (38.1 C) (06/07 1945) Temp Source: Bladder (06/07 1830) BP: 124/90 (06/07 1830) Pulse Rate: 112 (06/07 1830)  Labs: Recent Labs    04/12/18 0421 04/13/18 0424 04/14/18 0501 04/14/18 1140 04/14/18 2048  HGB  --   --  14.3  --   --   HCT  --   --  43.6  --   --   PLT  --   --  168  --   --   LABPROT 14.1 14.0 15.4*  --   --   INR 1.10 1.09 1.23  --   --   HEPARINUNFRC 0.37 0.40 0.26* 0.28* 0.41  CREATININE 2.50* 2.14* 1.89*  --   --     Estimated Creatinine Clearance: 78.8 mL/min (A) (by C-G formula based on SCr of 1.89 mg/dL (H)).   Medical History: Past Medical History:  Diagnosis Date  . Allergy   . Anxiety   . Arrhythmia   . Arthritis   . Atrial fibrillation ([place])   . CHF (congestive heart failure) ([place])   . CKD (chronic kidney disease)   . Diabetes mellitus without complication ([place])   . Diabetes mellitus, type II ([place])   . Heart failure ([place])   . Hyperlipidemia   . Hypertension   . Pancreatitis   . Sleep apnea     Medications:  Scheduled:  . chlorhexidine gluconate (MEDLINE KIT)  15 mL Mouth Rinse BID  . famotidine  20 mg Per Tube BID  . feeding supplement (GLUCERNA 1.5 CAL)  1,000 mL Per Tube Q24H  . feeding supplement (PRO-STAT SUGAR FREE 64)  60 mL Per Tube 5 X Daily  . furosemide  20 mg Intravenous Daily  . insulin aspart  3-9 Units Subcutaneous Q4H  . insulin aspart  8 Units Subcutaneous Q4H  . insulin detemir  50 Units Subcutaneous BID  . mouth rinse  15 mL Mouth Rinse 10 times per day  . multivitamin  15 mL Per Tube Daily  . sodium  chloride flush  10-40 mL Intracatheter Q12H   Infusions:  . sodium chloride    . amiodarone 60 mg/hr (04/14/18 1800)  . fentaNYL infusion INTRAVENOUS 225 mcg/hr (04/14/18 1800)  . heparin 2,150 Units/hr (04/14/18 1800)  . levETIRAcetam Stopped (04/14/18 2206)  . phenylephrine (NEO-SYNEPHRINE) Adult infusion 10.133 mcg/min (04/14/18 1800)  . propofol (DIPRIVAN) infusion 15 mcg/kg/min (04/14/18 1825)  . vancomycin Stopped (04/14/18 1530)    Assessment: Pharmacy consulted for warfarin and heparin drip management for 47 yo male admitted to the ICU s/p cardiac arrest. Patient has history significant for aortic valve replacement, atrial fibrillation and CHF. Head CT is negative for infarction or hemorrhage. Heparin level for today continues to be subtherapeutic at 0.28, with a goal of 0.3-0.7 units/mL. Confirmed with RN that heparin drip has not been interrupted. Per protocol, will re-bolus heparin and increase rate of heparin drip. Will obtain heparin level at 2030 and continue to monitor and adjust as needed.  Patient takes warfarin '5mg'$  daily as an outpatient for goal INR 2.5-3.5.    Goal of Therapy:  INR 2-3 Heparin level 0.3-0.7 units/ml Monitor platelets by  anticoagulation protocol: Yes   Plan:  Initiate heparin bolus at 1700 units IV x1. Increase heparin rate to 2150 units/hr. Obtain heparin level at 2030. Hgb/hct have been steadily declining over the past few weeks, will continue to monitor.  6/7 PM HL 0.41 therapeutic. Continue heparin drip at current rate. Will recheck HL in 6h to confirm and CBC in AM.    Rayna Sexton, PharmD, BCPS Clinical Pharmacist 04/14/2018 10:08 PM

## 2018-04-14 NOTE — Progress Notes (Signed)
[place] for heparin drip management Indication: atrial fibrillation and aortic valve replacement   Allergies  Allergen Reactions  . Lexapro [Escitalopram Oxalate] Swelling    Patient Measurements: Height: 5' 10"  (177.8 cm)(based on previous notes) Weight: (!) 400 lb 9.2 oz (181.7 kg) IBW/kg (Calculated) : 73 Heparin Dosing Weight: 111kg  Vital Signs: Temp: 100.6 F (38.1 C) (06/07 1330) Temp Source: Bladder (06/07 1330) BP: 96/65 (06/07 1330) Pulse Rate: 108 (06/07 1330)  Labs: Recent Labs    04/12/18 0421 04/13/18 0424 04/14/18 0501 04/14/18 1140  HGB  --   --  14.3  --   HCT  --   --  43.6  --   PLT  --   --  168  --   LABPROT 14.1 14.0 15.4*  --   INR 1.10 1.09 1.23  --   HEPARINUNFRC 0.37 0.40 0.26* 0.28*  CREATININE 2.50* 2.14* 1.89*  --     Estimated Creatinine Clearance: 78.8 mL/min (A) (by C-G formula based on SCr of 1.89 mg/dL (H)).   Medical History: Past Medical History:  Diagnosis Date  . Allergy   . Anxiety   . Arrhythmia   . Arthritis   . Atrial fibrillation ([place])   . CHF (congestive heart failure) ([place])   . CKD (chronic kidney disease)   . Diabetes mellitus without complication ([place])   . Diabetes mellitus, type II ([place])   . Heart failure ([place])   . Hyperlipidemia   . Hypertension   . Pancreatitis   . Sleep apnea     Medications:  Scheduled:  . chlorhexidine gluconate (MEDLINE KIT)  15 mL Mouth Rinse BID  . famotidine  20 mg Per Tube BID  . feeding supplement (GLUCERNA 1.5 CAL)  1,000 mL Per Tube Q24H  . feeding supplement (PRO-STAT SUGAR FREE 64)  60 mL Per Tube 5 X Daily  . furosemide  20 mg Intravenous Daily  . heparin  1,700 Units Intravenous Once  . insulin aspart  3-9 Units Subcutaneous Q4H  . insulin aspart  4 Units Subcutaneous Q4H  . insulin detemir  50 Units Subcutaneous BID  . mouth rinse  15 mL Mouth Rinse 10 times per day  . multivitamin  15 mL Per Tube Daily   Infusions:  .  sodium chloride    . amiodarone 60 mg/hr (04/14/18 1345)  . fentaNYL infusion INTRAVENOUS 225 mcg/hr (04/14/18 1135)  . heparin 1,950 Units/hr (04/14/18 1135)  . insulin (NOVOLIN-R) infusion Stopped (04/14/18 1235)  . levETIRAcetam Stopped (04/14/18 1050)  . phenylephrine (NEO-SYNEPHRINE) Adult infusion 10.133 mcg/min (04/14/18 1135)  . propofol (DIPRIVAN) infusion 15 mcg/kg/min (04/14/18 1345)  . vancomycin      Assessment: Pharmacy consulted for warfarin and heparin drip management for 47 yo male admitted to the ICU s/p cardiac arrest. Patient has history significant for aortic valve replacement, atrial fibrillation and CHF. Head CT is negative for infarction or hemorrhage. Heparin level for today continues to be subtherapeutic at 0.28, with a goal of 0.3-0.7 units/mL. Confirmed with RN that heparin drip has not been interrupted. Per protocol, will re-bolus heparin and increase rate of heparin drip. Will obtain heparin level at 2030 and continue to monitor and adjust as needed.  Patient takes warfarin 20m daily as an outpatient for goal INR 2.5-3.5.    Goal of Therapy:  INR 2-3 Heparin level 0.3-0.7 units/ml Monitor platelets by anticoagulation protocol: Yes   Plan:  Initiate heparin bolus at 1700 units IV x1. Increase  heparin rate to 2150 units/hr. Obtain heparin level at 2030. Hgb/hct have been steadily declining over the past few weeks, will continue to monitor.  Jannifer Rodney 04/14/2018

## 2018-04-14 NOTE — Progress Notes (Signed)
[facility] at [place] NAME: Alexander Alexander    MR#:  [id number]  DATE OF BIRTH:  1970-12-16  SUBJECTIVE:   Patient intubated and on the ventilator.  FiO2 60 % PEEP 10, still febrile but low gr and tachycardia improved, wife at bedside Patient is arousable on 2 sedatives propofol and fentanyl Still on amiodarone, heparin drip Neo-Synephrine gtt REVIEW OF SYSTEMS:   Review of Systems  Unable to perform ROS: Intubated   Tolerating Diet:Tube feeding Tolerating PT: intubated  DRUG ALLERGIES:   Allergies  Allergen Reactions  . Lexapro [Escitalopram Oxalate] Swelling    VITALS:  Blood pressure (!) 108/91, pulse (!) 111, temperature (!) 100.6 F (38.1 C), temperature source Bladder, resp. rate 20, height 5' 10"  (1.778 m), weight (!) 181.7 kg (400 lb 9.2 oz), SpO2 (!) 89 %.  PHYSICAL EXAMINATION:   Physical Exam  GENERAL:  47 y.o.-year-old patient lying in the bed with no acute distress. Morbidly obese EYES: Pupils equal, round, reactive to light and accommodation. No scleral icterus.  HEENT: Head atraumatic, normocephalic. Oropharynx and nasopharynx clear. Intubated and on the vent NECK:  Supple, no jugular venous distention. No thyroid enlargement, no tenderness.  LUNGS: Normal breath sounds bilaterally, no wheezing, rales, rhonchi. No use of accessory muscles of respiration.  CARDIOVASCULAR: S1, S2 normal. No murmurs, rubs, or gallops.  ABDOMEN: Soft, nontender, nondistended. Bowel sounds present. No organomegaly or mass.  EXTREMITIES: No cyanosis, clubbing or edema b/l.    NEUROLOGIC: unable to assess --pt intubated PSYCHIATRIC: intubated  SKIN: No obvious rash, lesion, or ulcer.   LABORATORY PANEL:  CBC Recent Labs  Lab 04/14/18 0501  WBC 15.5*  HGB 14.3  HCT 43.6  PLT 168    Chemistries  Recent Labs  Lab 04/12/18 0421  04/14/18 0501  NA 138   < > 141  K 4.8   < > 3.7  CL 99*   < > 103  CO2 27   < > 29   GLUCOSE 301*   < > 201*  BUN 44*   < > 54*  CREATININE 2.50*   < > 1.89*  CALCIUM 8.6*   < > 8.2*  MG 2.0  --   --   AST 83*  --   --   ALT 37  --   --   ALKPHOS 48  --   --   BILITOT 1.6*  --   --    < > = values in this interval not displayed.   Cardiac Enzymes Recent Labs  Lab 04/08/18 0427  TROPONINI 0.16*   RADIOLOGY:  Dg Chest Port 1 View  Result Date: 04/14/2018 CLINICAL DATA:  Hypoxia EXAM: PORTABLE CHEST 1 VIEW COMPARISON:  April 12, 2018 FINDINGS: Endotracheal tube tip is 2.0 cm above the carina. Nasogastric tube tip and side port are below the diaphragm. Central catheter tip is at the cavoatrial junction. No evident pneumothorax. There is focal consolidation in the right base. Lungs elsewhere appear clear. Note that there is overlying artifact from the cooling mat. Heart is mildly enlarged with pulmonary vascularity normal. No bone lesions. No adenopathy evident. IMPRESSION: Tube and catheter positions as described without pneumothorax. Consolidation right base, felt to represent focal pneumonia. No similar changes elsewhere. Stable cardiac prominence. Electronically Signed   By: Lowella Grip III M.D.   On: 04/14/2018 09:16   ASSESSMENT AND PLAN:  Alexander Alexander  is a 47 y.o. male who presents with complaint of low  back pain.  Patient is a known history of sciatica, medicated for similar complaint of low back pain with radiation down his legs.  Initially he told ED physician and the pain was much worse than it has been for him, and so MRI was ordered.  While patient was in radiology department waiting to start MRI he had a cardiac arrest.   *Acute respiratory failure with hypoxemia initially thought to be from cardiac arrest [facility]) - - He was intubated during his code event, and is being sent to the ICU for further treatment.    follow with intensivist currently FiO2-45% vent management by intensivist Discussed with intensivist  *septic shock -met criteria with fever and  tachycardia Not present at the time of admission On Neo-Synephrine Tylenol Pan cx, blood cultures negative, respiratory culture with gram-positive cocci Staphylococcus, urine culture with no growth-on vancomycin coolong blanket as needed  fever curve trending down  *Pulseless electric activity after back pain possibly consistent with a respiratory event rather than a cardiac event per cardiology -Patient is hemodynamically stable -Echocardiogram with 55 to 60% ejection fraction -cardiology  following discussed with Dr. Nehemiah Massed  *  Chronic combined systolic (congestive) and diastolic (congestive) heart failure ([place]) -also potentially causing some arrhythmias, including his ventricular rhythm which may have been responsible for his cardiac arrest.  He had significant runs of V. tach in the ED postarrest.  -Patient is off IV amiodarone.  *Coagulopathy with Warfarin and subtherapeutic INR. S/p Mechanical aortic valve replacement.  Currently on heparin drip  * AKI on  CKD (chronic kidney disease) stage 3, with hyperkalemia  Hyperkalemia resolved  creatinine at 2.45--214-1.89  Was volume, avoid nephrotoxins and monitor urine output, will consider nephrology consult if needed  *Elevated LFTs-probably acute phase reactants monitor closely   *HTN (hypertension) -hold home dose antihypertensives for now as the antiarrhythmics and his code event have dropped his blood pressure.  Patient is currently on IV pressors  *  T2DM (type 2 diabetes mellitus) ([place]) -sliding scale insulin with corresponding glucose checks  *  Atrial fibrillation (HCC) -continue rate controlling medications as well as anticoagulation with IV heparin, consider changing back over to warfarin with INR goal between 2-3  *  Aortic valve replaced -continue anticoagulation with IV heparin and consider changing back over to warfarin with INR goal between 2-3  *  OSA (obstructive sleep apnea) -currently intubated, once he is  extubated he will need CPAP nightly   *  Hyperlipidemia associated with type 2 diabetes mellitus ([place]) -continue home dose antilipid  Spoke with pts wife, feeling sad  Case discussed with Care Management/Social Worker. Management plans discussed with the patient, wife at bedside and they are in agreement.  CODE STATUS: full  DVT Prophylaxis: heparin gtt  TOTAL TIME TAKING CARE OF THIS PATIENT: 33 minutes.  >50% time spent on counselling and coordination of care  POSSIBLE D/C IN few DAYS, DEPENDING ON CLINICAL CONDITION.  Note: This dictation was prepared with Dragon dictation along with smaller phrase technology. Any transcriptional errors that result from this process are unintentional.  Nicholes Mango M.D on 04/14/2018 at 2:48 PM  Between 7am to 6pm - Pager - [phone]  After 6pm go to [email or web address] - password [email or web address] [place] [facility]  Office  [phone]  CC: Primary care physician; Virginia Crews, MDPatient ID: Alexander Alexander, male   DOB: 04/13/1971, 47 y.o.   MRN: [id number]

## 2018-04-14 NOTE — Progress Notes (Signed)
[place] for heparin drip management Indication: atrial fibrillation and aortic valve replacement   Allergies  Allergen Reactions  . Lexapro [Escitalopram Oxalate] Swelling    Patient Measurements: Height: _0  (177.8 cm)(based on previous notes) Weight: (!) 400 lb 9.2 oz (181.7 kg) IBW/kg (Calculated) : 73 Heparin Dosing Weight: 111kg  Vital Signs: Temp: 99.6 F (37.6 C) (06/07 0455) Temp Source: Oral (06/07 0455) BP: 79/57 (06/07 0600) Pulse Rate: 113 (06/07 0600)  Labs: Recent Labs    04/12/18 0421 04/13/18 0424 04/14/18 0501  HGB  --   --  14.3  HCT  --   --  43.6  PLT  --   --  168  LABPROT 14.1 14.0 15.4*  INR 1.10 1.09 1.23  HEPARINUNFRC 0.37 0.40 0.26*  CREATININE 2.50* 2.14* 1.89*    Estimated Creatinine Clearance: 78.8 mL/min (A) (by C-G formula based on SCr of 1.89 mg/dL (H)).   Medical History: Past Medical History:  Diagnosis Date  . Allergy   . Anxiety   . Arrhythmia   . Arthritis   . Atrial fibrillation ([place])   . CHF (congestive heart failure) ([place])   . CKD (chronic kidney disease)   . Diabetes mellitus without complication ([place])   . Diabetes mellitus, type II ([place])   . Heart failure ([place])   . Hyperlipidemia   . Hypertension   . Pancreatitis   . Sleep apnea     Medications:  Scheduled:  . chlorhexidine gluconate (MEDLINE KIT)  15 mL Mouth Rinse BID  . famotidine  20 mg Per Tube BID  . feeding supplement (GLUCERNA 1.5 CAL)  1,000 mL Per Tube Q24H  . feeding supplement (PRO-STAT SUGAR FREE 64)  60 mL Per Tube 5 X Daily  . furosemide  20 mg Intravenous Daily  . heparin  1,750 Units Intravenous Once  . mouth rinse  15 mL Mouth Rinse 10 times per day  . multivitamin  15 mL Per Tube Daily  . senna-docusate  2 tablet Per Tube BID   Infusions:  . sodium chloride    . amiodarone 60 mg/hr (04/14/18 0600)  . fentaNYL infusion INTRAVENOUS 225 mcg/hr (04/14/18 0600)  . heparin 1,800 Units/hr  (04/14/18 0600)  . insulin (NOVOLIN-R) infusion 18 mL/hr at 04/14/18 0600  . levETIRAcetam Stopped (04/13/18 2120)  . phenylephrine (NEO-SYNEPHRINE) Adult infusion 10 mcg/min (04/14/18 0600)  . propofol (DIPRIVAN) infusion 19.962 mcg/kg/min (04/14/18 0600)  . vancomycin Stopped (04/14/18 0320)    Assessment: Pharmacy consulted for warfarin and heparin drip management for 47 yo male admitted to the ICU s/p cardiac arrest. Patient has history significant for aortic valve replacement, atrial fibrillation and CHF. Patient currently requiring amiodarone infusion and blood pressure support from norepinephrine. Head CT is negative for infarction or hemorrhage.   Patient takes warfarin 29m daily as an outpatient for goal INR 2.5-3.5.   Heparin infusing at 1600 units/hr.   Goal of Therapy:  INR 2-3 Heparin level 0.3-0.7 units/ml Monitor platelets by anticoagulation protocol: Yes   Plan:  06/07 @ 0500 HL 0.26 subtherapeutic. Per RN drip only stopped for 5 mins to draw labs. Otherwise patient has gained some weight. Will bolus patient w/ heparin 1750 units IV x 1 and increase rate to 1950 units/hr and will recheck anti-Xa @ 1100. Hgb/hct have been steadily declining over the past few weeks, will continue to monitor.  DTobie Lords PharmD, BCPS Clinical Pharmacist 04/14/2018

## 2018-04-15 LAB — CBC
HCT: 42.5 % (ref 40.0–52.0)
Hemoglobin: 13.6 g/dL (ref 13.0–18.0)
MCH: 27.7 pg (ref 26.0–34.0)
MCHC: 32.1 g/dL (ref 32.0–36.0)
MCV: 86.2 fL (ref 80.0–100.0)
Platelets: 183 10*3/uL (ref 150–440)
RBC: 4.93 MIL/uL (ref 4.40–5.90)
RDW: 18.4 % — AB (ref 11.5–14.5)
WBC: 14.6 10*3/uL — AB (ref 3.8–10.6)

## 2018-04-15 LAB — BASIC METABOLIC PANEL
Anion gap: 9 (ref 5–15)
BUN: 68 mg/dL — AB (ref 6–20)
CALCIUM: 8.4 mg/dL — AB (ref 8.9–10.3)
CHLORIDE: 102 mmol/L (ref 101–111)
CO2: 28 mmol/L (ref 22–32)
CREATININE: 2.38 mg/dL — AB (ref 0.61–1.24)
GFR calc Af Amer: 35 mL/min — ABNORMAL LOW (ref >60)
GFR, EST NON AFRICAN AMERICAN: 31 mL/min — AB (ref >60)
Glucose, Bld: 352 mg/dL — ABNORMAL HIGH (ref 65–99)
Potassium: 4.5 mmol/L (ref 3.5–5.1)
SODIUM: 139 mmol/L (ref 135–145)

## 2018-04-15 LAB — GLUCOSE, CAPILLARY
GLUCOSE-CAPILLARY: 161 mg/dL — AB (ref 65–99)
GLUCOSE-CAPILLARY: 178 mg/dL — AB (ref 65–99)
GLUCOSE-CAPILLARY: 193 mg/dL — AB (ref 65–99)
GLUCOSE-CAPILLARY: 215 mg/dL — AB (ref 65–99)
GLUCOSE-CAPILLARY: 217 mg/dL — AB (ref 65–99)
GLUCOSE-CAPILLARY: 217 mg/dL — AB (ref 65–99)
GLUCOSE-CAPILLARY: 219 mg/dL — AB (ref 65–99)
GLUCOSE-CAPILLARY: 239 mg/dL — AB (ref 65–99)
GLUCOSE-CAPILLARY: 246 mg/dL — AB (ref 65–99)
GLUCOSE-CAPILLARY: 267 mg/dL — AB (ref 65–99)
GLUCOSE-CAPILLARY: 297 mg/dL — AB (ref 65–99)
GLUCOSE-CAPILLARY: 381 mg/dL — AB (ref 65–99)
Glucose-Capillary: 350 mg/dL — ABNORMAL HIGH (ref 65–99)
Glucose-Capillary: 347 mg/dL — ABNORMAL HIGH (ref 65–99)
Glucose-Capillary: 327 mg/dL — ABNORMAL HIGH (ref 65–99)
Glucose-Capillary: 315 mg/dL — ABNORMAL HIGH (ref 65–99)
Glucose-Capillary: 318 mg/dL — ABNORMAL HIGH (ref 65–99)
Glucose-Capillary: 267 mg/dL — ABNORMAL HIGH (ref 65–99)
Glucose-Capillary: 241 mg/dL — ABNORMAL HIGH (ref 65–99)
Glucose-Capillary: 237 mg/dL — ABNORMAL HIGH (ref 65–99)
Glucose-Capillary: 199 mg/dL — ABNORMAL HIGH (ref 65–99)
Glucose-Capillary: 181 mg/dL — ABNORMAL HIGH (ref 65–99)
Glucose-Capillary: 170 mg/dL — ABNORMAL HIGH (ref 65–99)

## 2018-04-15 LAB — CULTURE, RESPIRATORY W GRAM STAIN

## 2018-04-15 LAB — HEPARIN LEVEL (UNFRACTIONATED): HEPARIN UNFRACTIONATED: 0.56 [IU]/mL (ref 0.30–0.70)

## 2018-04-15 MED ORDER — SODIUM CHLORIDE 0.9 % IV SOLN
INTRAVENOUS | Status: DC
  Administered 2018-04-15: 22.3 [IU]/h via INTRAVENOUS
  Administered 2018-04-15: 26.7 [IU]/h via INTRAVENOUS
  Administered 2018-04-15: 2.6 [IU]/h via INTRAVENOUS
  Administered 2018-04-15: 27.8 [IU]/h via INTRAVENOUS
  Administered 2018-04-15: 15.3 [IU]/h via INTRAVENOUS
  Administered 2018-04-16: 20 [IU]/h via INTRAVENOUS
  Administered 2018-04-16: 17.7 [IU]/h via INTRAVENOUS
  Administered 2018-04-16: 13.1 [IU]/h via INTRAVENOUS
  Administered 2018-04-16: 21.2 [IU]/h via INTRAVENOUS
  Administered 2018-04-17: 8.5 [IU]/h via INTRAVENOUS
  Administered 2018-04-18: 10.6 [IU]/h via INTRAVENOUS
  Administered 2018-04-19: 5.9 [IU]/h via INTRAVENOUS
  Filled 2018-04-15 (×13): qty 1

## 2018-04-15 NOTE — Progress Notes (Signed)
Follow up - Critical Care Medicine Note  Patient Details:    Alexander Alexander is an 47 y.o. male. 47 year old gentleman with past medical history of atrial fibrillation, status post ablation and aortic valve replacement on warfarin, history of congestive heart failure, chronic renal disease, morbid obesity, diabetes, hyperlipidemia, obstructive sleep apnea and hypertension, suffered a pulseless V. Tach cardiac arrest in MRI when he was being evaluated for back pain and sciatica, subsequently intubated in the intensive care unit.  Lines, Airways, Drains: Airway 7.5 mm (Active)  Secured at (cm) 24 cm 04/10/2018  4:16 AM  Measured From Lips 04/10/2018  4:16 AM  Secured Location Right 04/10/2018  4:16 AM  Secured By [employer] 04/10/2018  4:16 AM  Tube Holder Repositioned Yes 04/10/2018  2:21 AM  Cuff Pressure (cm H2O) 26 cm H2O 04/09/2018  6:11 PM  Site Condition Dry 04/10/2018  4:16 AM     CVC Triple Lumen 04/07/18 Right External jugular (Active)  Indication for Insertion or Continuance of Line Vasoactive infusions;Prolonged intravenous therapies 04/09/2018  8:00 PM  Site Assessment Clean;Dry;Intact 04/09/2018  8:00 PM  Proximal Lumen Status Infusing;Flushed 04/09/2018  8:00 PM  Medial Lumen Status Infusing;Flushed 04/09/2018  8:00 PM  Distal Lumen Status Infusing;Flushed 04/09/2018  8:00 PM  Dressing Type Transparent 04/09/2018  8:00 PM  Dressing Status Clean;Dry;Intact;Antimicrobial disc in place 04/09/2018  8:00 PM  Line Care Connections checked and tightened 04/09/2018  8:00 PM  Dressing Intervention Dressing changed;Antimicrobial disc changed 04/10/2018  4:31 AM  Dressing Change Due 04/17/18 04/10/2018  4:31 AM     NG/OG Tube Orogastric 18 Fr. Right mouth Aucultation 55 cm (Active)  Cm Marking at Nare/Corner of Mouth (if applicable) 55 cm 04/10/2018  4:16 AM  Site Assessment Clean;Dry;Intact 04/10/2018  4:16 AM  Ongoing Placement Verification No change in cm markings or external length of tube from initial  placement;No change in respiratory status;No acute changes, not attributed to clinical condition 04/10/2018  4:16 AM  Status Infusing tube feed 04/10/2018  4:16 AM  Intake (mL) 30 mL 04/08/2018  5:00 PM     Urethral Catheter Alexander Alexander Temperature probe 16 Fr. (Active)  Indication for Insertion or Continuance of Catheter Other (comment) 04/10/2018  4:16 AM  Site Assessment Clean;Intact;Dry 04/10/2018  4:16 AM  Catheter Maintenance Bag below level of bladder;Catheter secured;Drainage bag/tubing not touching floor;Seal intact;No dependent loops;Insertion date on drainage bag;Bag emptied prior to transport 04/10/2018  4:16 AM  Collection Container Standard drainage bag 04/10/2018  4:16 AM  Securement Method Securing device (Describe) 04/10/2018  4:16 AM  Urinary Catheter Interventions Unclamped 04/10/2018  4:16 AM  Output (mL) 75 mL 04/10/2018  5:41 AM    Anti-infectives:  Anti-infectives (From admission, onward)   Start     Dose/Rate Route Frequency Ordered Stop   04/14/18 1430  vancomycin (VANCOCIN) IVPB 1000 mg/200 mL premix     1,000 mg 200 mL/hr over 60 Minutes Intravenous Every 12 hours 04/14/18 1348     04/11/18 2000  vancomycin (VANCOCIN) 1,250 mg in sodium chloride 0.9 % 250 mL IVPB  Status:  Discontinued     1,250 mg 166.7 mL/hr over 90 Minutes Intravenous Every 18 hours 04/11/18 1548 04/14/18 1348   04/10/18 1400  vancomycin (VANCOCIN) 1,250 mg in sodium chloride 0.9 % 250 mL IVPB  Status:  Discontinued     1,250 mg 166.7 mL/hr over 90 Minutes Intravenous Every 12 hours 04/10/18 1149 04/11/18 1548   04/10/18 1200  Ampicillin-Sulbactam (UNASYN) 3 g in sodium  chloride 0.9 % 100 mL IVPB  Status:  Discontinued     3 g 200 mL/hr over 30 Minutes Intravenous Every 6 hours 04/10/18 0851 04/10/18 1131   04/09/18 1800  vancomycin (VANCOCIN) 1,500 mg in sodium chloride 0.9 % 500 mL IVPB  Status:  Discontinued     1,500 mg 250 mL/hr over 120 Minutes Intravenous Every 18 hours 04/09/18 1303 04/10/18 0750   04/09/18  1045  vancomycin (VANCOCIN) IVPB 1000 mg/200 mL premix     1,000 mg 200 mL/hr over 60 Minutes Intravenous  Once 04/09/18 1041 04/09/18 1307   04/07/18 1400  piperacillin-tazobactam (ZOSYN) IVPB 3.375 g  Status:  Discontinued     3.375 g 12.5 mL/hr over 240 Minutes Intravenous Every 8 hours 04/07/18 1015 04/10/18 0851      Microbiology: Results for orders placed or performed during the hospital encounter of 04/07/18  MRSA PCR Screening     Status: None   Collection Time: 04/07/18  9:49 AM  Result Value Ref Range Status   MRSA by PCR NEGATIVE NEGATIVE Final    Comment:        The GeneXpert MRSA Assay (FDA approved for NASAL specimens only), is one component of a comprehensive MRSA colonization surveillance program. It is not intended to diagnose MRSA infection nor to guide or monitor treatment for MRSA infections. Performed at [facility], [address] [place], [place] [address]   Culture, respiratory (NON-Expectorated)     Status: None   Collection Time: 04/08/18 10:53 AM  Result Value Ref Range Status   Specimen Description   Final    TRACHEAL ASPIRATE Performed at [facility], [address] [place], [place] [address]    Special Requests   Final    Normal Performed at [facility], [address] [place], [place] [address]    Gram Stain   Final    ABUNDANT WBC PRESENT, PREDOMINANTLY PMN NO SQUAMOUS EPITHELIAL CELLS SEEN MODERATE GRAM POSITIVE COCCI IN CLUSTERS FEW GRAM NEGATIVE COCCOBACILLI RARE GRAM POSITIVE RODS Performed at [facility], [address] [place], [place] [address]    Culture   Final    ABUNDANT METHICILLIN RESISTANT STAPHYLOCOCCUS AUREUS   Report Status 04/10/2018 FINAL  Final   Organism ID, Bacteria METHICILLIN RESISTANT STAPHYLOCOCCUS AUREUS  Final      Susceptibility   Methicillin resistant staphylococcus aureus - MIC*    CIPROFLOXACIN >=8 RESISTANT Resistant     ERYTHROMYCIN >=8 RESISTANT Resistant      GENTAMICIN <=0.5 SENSITIVE Sensitive     OXACILLIN >=4 RESISTANT Resistant     TETRACYCLINE <=1 SENSITIVE Sensitive     VANCOMYCIN 1 SENSITIVE Sensitive     TRIMETH/SULFA <=10 SENSITIVE Sensitive     CLINDAMYCIN <=0.25 SENSITIVE Sensitive     RIFAMPIN <=0.5 SENSITIVE Sensitive     Inducible Clindamycin NEGATIVE Sensitive     * ABUNDANT METHICILLIN RESISTANT STAPHYLOCOCCUS AUREUS  CULTURE, BLOOD (ROUTINE X 2) w Reflex to ID Panel     Status: None   Collection Time: 04/08/18 11:43 AM  Result Value Ref Range Status   Specimen Description BLOOD LAC  Final   Special Requests   Final    BOTTLES DRAWN AEROBIC AND ANAEROBIC Blood Culture adequate volume   Culture   Final    NO GROWTH 5 DAYS Performed at [facility], [address] [place], [place] [address]    Report Status 04/13/2018 FINAL  Final  CULTURE, BLOOD (ROUTINE X 2) w Reflex  to ID Panel     Status: None   Collection Time: 04/08/18 11:43 AM  Result Value Ref Range Status   Specimen Description BLOOD LT HAND  Final   Special Requests   Final    BOTTLES DRAWN AEROBIC AND ANAEROBIC Blood Culture adequate volume   Culture   Final    NO GROWTH 5 DAYS Performed at [facility], [address], [place], [place] [address]    Report Status 04/13/2018 FINAL  Final  Urine Culture     Status: None   Collection Time: 04/08/18  1:16 PM  Result Value Ref Range Status   Specimen Description   Final    URINE, CATHETERIZED Performed at [facility], [address], [place], [place] [address]    Special Requests   Final    Normal Performed at [facility], [address] [place], [place] [address]    Culture   Final    NO GROWTH Performed at [facility], [address] [place], [place] [address]    Report Status 04/09/2018 FINAL  Final  Urine Culture     Status: None   Collection Time: 04/12/18  6:55 AM  Result Value Ref Range Status   Specimen Description   Final    URINE,  RANDOM Performed at [facility], [address] [place], [place] [address]    Special Requests   Final    NONE Performed at [facility], [address] [place], [place] [address]    Culture   Final    NO GROWTH Performed at [facility], [address], [place], [place] [address]    Report Status 04/13/2018 FINAL  Final  CULTURE, BLOOD (ROUTINE X 2) w Reflex to ID Panel     Status: None (Preliminary result)   Collection Time: 04/12/18  7:30 AM  Result Value Ref Range Status   Specimen Description BLOOD BLOOD LEFT HAND  Final   Special Requests   Final    BOTTLES DRAWN AEROBIC AND ANAEROBIC Blood Culture adequate volume   Culture   Final    NO GROWTH 3 DAYS Performed at [facility], [address], [place], [place] [address]    Report Status PENDING  Incomplete  CULTURE, BLOOD (ROUTINE X 2) w Reflex to ID Panel     Status: None (Preliminary result)   Collection Time: 04/12/18  7:46 AM  Result Value Ref Range Status   Specimen Description BLOOD BLOOD LEFT HAND  Final   Special Requests   Final    BOTTLES DRAWN AEROBIC AND ANAEROBIC Blood Culture adequate volume   Culture   Final    NO GROWTH 3 DAYS Performed at [facility], [address] [place], [place] [address]    Report Status PENDING  Incomplete  Culture, respiratory (NON-Expectorated)     Status: None (Preliminary result)   Collection Time: 04/12/18  8:11 AM  Result Value Ref Range Status   Specimen Description   Final    TRACHEAL ASPIRATE Performed at [facility], [address], [place], [place] [address]    Special Requests   Final    NONE Performed at [facility], [address] [place], [place] [address]    Gram Stain   Final    FEW WBC PRESENT, PREDOMINANTLY PMN FEW GRAM POSITIVE COCCI    Culture   Final    FEW STAPHYLOCOCCUS AUREUS REPEATING SUSCEPTIBILITIES Performed at [facility],  [address] [place], [place]  [address]    Report Status PENDING  Incomplete  C difficile quick scan w PCR reflex     Status: None   Collection Time: 04/12/18  3:05 PM  Result Value Ref Range Status   C Diff antigen NEGATIVE NEGATIVE Final   C Diff toxin NEGATIVE NEGATIVE Final   C Diff interpretation No C. difficile detected.  Final    Comment: Performed at [facility] [place], [address] [place], [place] [address]   Events: over the last 24 hours no real significant change in status, patient has required 60% FiO2 and 10 of PEEP.    Dg Chest 1 View  Result Date: 04/07/2018 CLINICAL DATA:  47 y/o  M; intubated. EXAM: CHEST  1 VIEW COMPARISON:  12/06/2017 chest radiograph FINDINGS: Stable cardiomegaly given projection and technique. Transcutaneous pacing pads. Post median sternotomy with wires in alignment. Low lung volumes. Pulmonary vascular congestion. No focal consolidation. Endotracheal tube tip 2.4 cm above the carina. Enteric tube tip appears to project over the lower esophagus, advancement recommended. IMPRESSION: 1. Endotracheal tube tip 2.4 cm above the carina. 2. Cardiomegaly and pulmonary vascular congestion. 3. Enteric tube tip appears to project over the lower esophagus, advancement recommended. Electronically Signed   By: Mitzi Hansen M.D.   On: 04/07/2018 06:22   Ct Head Wo Contrast  Result Date: 04/09/2018 CLINICAL DATA:  47 year old with acute mental status changes after cardiac arrest. Possible new onset seizures. EXAM: CT HEAD WITHOUT CONTRAST TECHNIQUE: Contiguous axial images were obtained from the base of the skull through the vertex without intravenous contrast. COMPARISON:  04/07/2018. FINDINGS: Brain: Ventricular system normal in size and appearance for age. Physiologic calcifications in the basal ganglia bilaterally as noted previously. No mass lesion. No midline shift. No acute hemorrhage or hematoma. No extra-axial fluid collections. No evidence of acute infarction. No evidence of  cerebral edema at this time. Vascular: Moderate BILATERAL carotid siphon and vertebrobasilar atherosclerosis. No hyperdense vessel. Skull: No skull fracture or other focal osseous abnormality involving the skull. Sinuses/Orbits: Visualized paranasal sinuses, bilateral mastoid air cells and bilateral middle ear cavities well-aerated. Visualized orbits and globes normal in appearance. Other: None. IMPRESSION: No acute intracranial abnormality. Electronically Signed   By: Hulan Saas M.D.   On: 04/09/2018 11:20   Ct Head Wo Contrast  Result Date: 04/07/2018 CLINICAL DATA:  Cardiac arrest EXAM: CT HEAD WITHOUT CONTRAST TECHNIQUE: Contiguous axial images were obtained from the base of the skull through the vertex without intravenous contrast. COMPARISON:  None. FINDINGS: Brain: No evidence of acute infarction, hemorrhage, hydrocephalus, extra-axial collection or mass lesion/mass effect. Vascular: No hyperdense vessel or unexpected calcification. Skull: Negative Sinuses/Orbits: Negative Other: None IMPRESSION: Negative CT head Electronically Signed   By: Marlan Palau M.D.   On: 04/07/2018 15:25   Dg Chest Port 1 View  Result Date: 04/14/2018 CLINICAL DATA:  Hypoxia EXAM: PORTABLE CHEST 1 VIEW COMPARISON:  April 12, 2018 FINDINGS: Endotracheal tube tip is 2.0 cm above the carina. Nasogastric tube tip and side port are below the diaphragm. Central catheter tip is at the cavoatrial junction. No evident pneumothorax. There is focal consolidation in the right base. Lungs elsewhere appear clear. Note that there is overlying artifact from the cooling mat. Heart is mildly enlarged with pulmonary vascularity normal. No bone lesions. No adenopathy evident. IMPRESSION: Tube and catheter positions as described without pneumothorax. Consolidation right base, felt to represent focal pneumonia. No similar changes elsewhere. Stable cardiac prominence. Electronically Signed  By: Bretta BangWilliam  Woodruff III M.D.   On: 04/14/2018  09:16   Dg Chest Port 1 View  Result Date: 04/12/2018 CLINICAL DATA:  Dyspnea. Endotracheal tube present. Status post cardiac arrest. EXAM: PORTABLE CHEST 1 VIEW COMPARISON:  04/10/2018 FINDINGS: Endotracheal tube, right jugular central venous catheter, and nasogastric tube remain in appropriate position. Heart size is stable. Low lung volumes are seen. Atelectasis again seen in both lung bases. No new or worsening areas of pulmonary opacity are seen. No evidence of pulmonary consolidation or pneumothorax. IMPRESSION: No significant change in low lung volumes and bibasilar atelectasis. Electronically Signed   By: Myles RosenthalJohn  Stahl M.D.   On: 04/12/2018 10:07   Dg Chest Port 1 View  Result Date: 04/10/2018 CLINICAL DATA:  Acute respiratory failure EXAM: PORTABLE CHEST 1 VIEW COMPARISON:  Earlier today at 1004 hours FINDINGS: Mildly degraded exam due to AP portable technique and patient body habitus. Endotracheal tube terminates 3.3 cm above carina.Prior median sternotomy. Nasogastric tube not well visualized distally but likely extends beyond the inferior aspect of the film. Right internal jugular line tip at low SVC. Cardiomegaly accentuated by AP portable technique. Small bilateral pleural effusions. No pneumothorax. Mild interstitial edema, superimposed upon low lung volumes. Bibasilar airspace disease. IMPRESSION: Decreased sensitivity and specificity exam due to technique related factors, as described above. Cardiomegaly with mild congestive heart failure, low lung volumes, and layering bilateral pleural effusions. Bibasilar airspace disease which is most likely atelectasis. Electronically Signed   By: Jeronimo GreavesKyle  Talbot M.D.   On: 04/10/2018 19:42   Dg Chest Port 1 View  Result Date: 04/10/2018 CLINICAL DATA:  Hypoxia EXAM: PORTABLE CHEST 1 VIEW COMPARISON:  April 09, 2018 FINDINGS: Endotracheal tube tip is 4.3 cm above the carina. Central catheter tip is at cavoatrial junction. Nasogastric tube tip and side port  below the diaphragm. No pneumothorax. There is patchy airspace consolidation in both medial lung bases with small pleural effusions bilaterally. Lungs elsewhere clear. Heart is mildly enlarged with pulmonary vascularity normal. No adenopathy. Patient is status post median sternotomy. No bone lesions evident. IMPRESSION: Tube and catheter positions as described without pneumothorax. Suspect bibasilar pneumonia. Small pleural effusions. Stable cardiac prominence. Electronically Signed   By: Bretta BangWilliam  Woodruff III M.D.   On: 04/10/2018 10:27   Dg Chest Port 1 View  Result Date: 04/09/2018 CLINICAL DATA:  Pneumonia EXAM: PORTABLE CHEST 1 VIEW COMPARISON:  April 08, 2018 FINDINGS: The ETT is in good position. The distal NG tube terminates below today's film. Stable cardiomegaly. Bibasilar opacities may represent small effusions with underlying atelectasis, stable on the right and mildly increased on the left. The right IJ is stable. No other interval changes. IMPRESSION: 1. Support apparatus as above. 2. Bibasilar opacities may represent small effusions and underlying atelectasis, stable on the right and mildly increased on the left in the interval. Electronically Signed   By: Gerome Samavid  Williams III M.D   On: 04/09/2018 07:14   Dg Chest Port 1 View  Result Date: 04/08/2018 CLINICAL DATA:  Followup pneumonia. EXAM: PORTABLE CHEST 1 VIEW COMPARISON:  04/07/2018 and older exams. FINDINGS: Lung volumes are low. This accentuates the appearance of vascular congestion and interstitial prominence. There is mild linear atelectasis in the right mid lung, stable from the prior exam. Bilateral lung base opacity is most likely atelectasis. Pneumonia is possible. No overt pulmonary edema. Is Stable changes from prior cardiac surgery. Cardiac silhouette is mildly enlarged. Endotracheal tube, right internal jugular central venous line and nasal/orogastric tube are stable. IMPRESSION:  1. No significant change from the previous day's  study. 2. Vascular congestion with interstitial prominence, but without overt pulmonary edema. 3. Basilar atelectasis.  Cannot exclude pneumonia. Electronically Signed   By: Amie Portland M.D.   On: 04/08/2018 07:23   Dg Chest Portable 1 View  Result Date: 04/07/2018 CLINICAL DATA:  Central catheter placement.  Hypoxia. EXAM: PORTABLE CHEST 1 VIEW COMPARISON:  Apr 07, 2018 study obtained earlier in the day FINDINGS: Right jugular catheter tip is in the superior vena cava near the cavoatrial junction. Endotracheal tube tip is 4.0 cm above the carina. Nasogastric tube tip and side port are below the diaphragm. No pneumothorax. There is bibasilar atelectasis. Lungs elsewhere are clear. 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 No adenopathy. No bone lesions. Patient is status post median sternotomy with aortic valve replacement. IMPRESSION: Tube and catheter positions as described without pneumothorax. There is pulmonary vascular congestion. Bibasilar atelectasis present. No consolidation evident. Electronically Signed   By: Bretta Bang III M.D.   On: 04/07/2018 08:51   Dg Abd Portable 1v  Result Date: 04/10/2018 CLINICAL DATA:  Status post OG tube placement. EXAM: PORTABLE ABDOMEN - 1 VIEW COMPARISON:  Single view of the abdomen 04/07/2018. FINDINGS: OG tube tip  and sideport are both in the stomach. IMPRESSION: OG tube in good position. Electronically Signed   By: Drusilla Kanner M.D.   On: 04/10/2018 14:32   Dg Abd Portable 1v  Result Date: 04/07/2018 CLINICAL DATA:  NG placement EXAM: PORTABLE ABDOMEN - 1 VIEW COMPARISON:  None. FINDINGS: Limited exam due to large patient size and difficulty with positioning. NG tip in the proximal stomach. NG side hole in the distal esophagus. No dilated bowel loops. IMPRESSION: NG tube in the proximal stomach with the side hole in the distal esophagus. Recommend advancing at least 5 cm. Electronically Signed   By: Marlan Palau M.D.   On:  04/07/2018 10:48    Consults: Treatment Team:  Lamar Blinks, MD Uvaldo Rising, MD Anson Fret, MD Thana Farr, MD   Objective:  Vital signs for last 24 hours: Temp:  [99.6 F (37.6 C)-101 F (38.3 C)] 99.9 F (37.7 C) (06/08 0300) Pulse Rate:  [99-118] 104 (06/08 0707) Resp:  [0-52] 20 (06/08 0707) BP: (72-129)/(52-97) 102/63 (06/08 0707) SpO2:  [86 %-96 %] 92 % (06/08 0707) FiO2 (%):  [50 %-60 %] 60 % (06/08 0349)  Hemodynamic parameters for last 24 hours:    Intake/Output from previous day: 06/07 0701 - 06/08 0700 In: 3935.8 [I.V.:2515.8; NG/GT:1020; IV Piggyback:400] Out: 2030 [Urine:2030]  Intake/Output this shift: No intake/output data recorded.  Vent settings for last 24 hours: Vent Mode: PRVC FiO2 (%):  [50 %-60 %] 60 % Set Rate:  [20 bmp] 20 bmp Vt Set:  [550 mL] 550 mL PEEP:  [10 cmH20] 10 cmH20 Plateau Pressure:  [22 cmH20] 22 cmH20  Physical Exam:  Patient is arousable but somnolent. Presently on propofol HEENT: Patient is orally intubated, central line noted, trachea is midline, no accessory muscle utilization Cardiovascular: Atrial fibrillation noted on monitor, irregularly irregular rhythm with ventricular response averaging in the low 100s Pulmonary: Basilar crackles noted Abdominal: Distended abdomen, bowel sounds noted Extremity: 1+ edema noted Neurologic: Patient moves all extremities Cutaneous: No rashes or lesions noted  Assessment/Plan:    Acute respiratory failurewith hypoxemia status post cardiac arrest. combination of volume overload and pneumonia. Patient has grown staph species and sputum presently on vancomycin. Presently on 60% and 10 of PEEP  without significant improvement throughout the day yesterday. Discussed with wife goals of care consideration for comfort care versus tracheostomy if patient does not have significant improvement  Pulseless V. tach cardiac arrest. ECHO LVEF 55-60%.Cardiology arrest was  secondary to respiratory issues  Altered mental status. appreciate neurology input. CT scan of the head was negative, no evidence of anoxic brain injury, presently empirically started on Keppra for possible partial seizure  Leukocytosis. On broad spectrum coverage.  AKI on CKD.BUNs 62, 2.38. Worsening renal function, will back off on diuresis support hemodynamics with pressors  Hyperglycemia. Had to be started back on insulin infusion secondary to uncontrolled blood sugars  S/p Mechanical aortic valve replacement.on anticoagulation   Critical Care Total Time 35 minutes  Ariana Juul 04/15/2018  *Care during the described time interval was provided by me and/or other providers on the critical care team.  I have reviewed this patient's available data, including medical history, events of note, physical examination and test results as part of my evaluation. Patient ID: Xion Debruyne, male   DOB: 10-17-1971, 47 y.o.   MRN: [id number] Patient ID: Happy Ky, male   DOB: Jul 12, 1971, 47 y.o.   MRN: [id number] Patient ID: Tylee Newby, male   DOB: 06/29/1971, 47 y.o.   MRN: [id number]

## 2018-04-15 NOTE — Progress Notes (Signed)
[facility] [place] [facility]   PATIENT NAME: Alexander Alexander    MR#:  [id number]  DATE OF BIRTH:  [birth year]  SUBJECTIVE:  CHIEF COMPLAINT:   Chief Complaint  Patient presents with  . Back Pain   -Patient is status post cardiac arrest.  Obese male. -Intubated and on ventilator.  Increased secretions.  Being treated for MRSA pneumonia. -On amiodarone drip for A. fib RVR, heparin drip for A. fib and aortic valve replacement.  Alert and following simple commands  REVIEW OF SYSTEMS:  Review of Systems  Unable to perform ROS: Critical illness  Gastrointestinal: Positive for heartburn.    DRUG ALLERGIES:   Allergies  Allergen Reactions  . Lexapro [Escitalopram Oxalate] Swelling    VITALS:  Blood pressure 102/63, pulse (!) 104, temperature 99.9 F (37.7 C), temperature source Bladder, resp. rate 20, height 5\' 10"  (1.778 m), weight (!) 181.7 kg (400 lb 9.2 oz), SpO2 92 %.  PHYSICAL EXAMINATION:  Physical Exam  GENERAL:  47 y.o.-year-old morbidly obese patient lying in the bed, critically ill appearing. EYES: Pupils equal, round, reactive to light and accommodation. No scleral icterus. Extraocular muscles intact.  HEENT: Head atraumatic, normocephalic.  Orally intubated, OG tube present.  Increased secretions noted. NECK:  Supple, no jugular venous distention. No thyroid enlargement, no tenderness.  LUNGS: Coarse breath sounds bilaterally, decreased at the bases.  No wheezing, rales,rhonchi or crepitation. No use of accessory muscles of respiration.  CARDIOVASCULAR: S1, S2 normal. No murmurs, rubs, or gallops.  ABDOMEN: Soft, obese, nontender, nondistended. Bowel sounds present. No organomegaly or mass.  EXTREMITIES: No pedal edema, cyanosis, or clubbing.  NEUROLOGIC: Patient is alert.  On minimal sedation.  Able to follow simple commands at this time PSYCHIATRIC: The patient is alert and following simple commands SKIN: No obvious rash, lesion, or ulcer.      LABORATORY PANEL:   CBC Recent Labs  Lab 04/15/18 0421  WBC 14.6*  HGB 13.6  HCT 42.5  PLT 183   ------------------------------------------------------------------------------------------------------------------  Chemistries  Recent Labs  Lab 04/12/18 0421  04/15/18 0421  NA 138   < > 139  K 4.8   < > 4.5  CL 99*   < > 102  CO2 27   < > 28  GLUCOSE 301*   < > 352*  BUN 44*   < > 68*  CREATININE 2.50*   < > 2.38*  CALCIUM 8.6*   < > 8.4*  MG 2.0  --   --   AST 83*  --   --   ALT 37  --   --   ALKPHOS 48  --   --   BILITOT 1.6*  --   --    < > = values in this interval not displayed.   ------------------------------------------------------------------------------------------------------------------  Cardiac Enzymes No results for input(s): TROPONINI in the last 168 hours. ------------------------------------------------------------------------------------------------------------------  RADIOLOGY:  Dg Chest Port 1 View  Result Date: 04/14/2018 CLINICAL DATA:  Hypoxia EXAM: PORTABLE CHEST 1 VIEW COMPARISON:  April 12, 2018 FINDINGS: Endotracheal tube tip is 2.0 cm above the carina. Nasogastric tube tip and side port are below the diaphragm. Central catheter tip is at the cavoatrial junction. No evident pneumothorax. There is focal consolidation in the right base. Lungs elsewhere appear clear. Note that there is overlying artifact from the cooling mat. Heart is mildly enlarged with pulmonary vascularity normal. No bone lesions. No adenopathy evident. IMPRESSION: Tube and catheter positions as described without pneumothorax. Consolidation right  base, felt to represent focal pneumonia. No similar changes elsewhere. Stable cardiac prominence. Electronically Signed   By: Bretta Bang III M.D.   On: 04/14/2018 09:16    EKG:   Orders placed or performed during the hospital encounter of 04/07/18  . EKG 12-Lead  . EKG 12-Lead  . EKG 12-Lead  . EKG 12-Lead     ASSESSMENT AND PLAN:   47 year old male with past medical history significant for A. fib status post ablation, aortic valve replacement on warfarin, history of congestive heart failure, CKD, morbid obesity, diabetes and sleep apnea who was getting an outpatient MRI of his lower back had a cardiac arrest.  1.  Cardiac arrest-from respiratory causes. -Patient has acute hypoxic respiratory failure-currently remains on ventilator.  Still requiring 60% FiO2 and 10 of PEEP -Sputum cultures positive for MRSA.  Currently on vancomycin. -Continue management per ICU team  2.  Atrial fibrillation-tachycardic. -Remains on amiodarone drip at this time -Receiving heparin drip for anticoagulation.  Also has a history of mechanical aortic valve replacement.  3.  Altered mental status-likely metabolic encephalopathy. -No anoxic changes.  Appreciate neurology consult -Keppra was started for possible partial seizures  4.  Diabetes mellitus-currently on insulin drip while in the ICU  5.  Acute renal failure on CKD secondary to ATN.  Creatinine is stable around 2. -Continue to monitor urine output  6.  Septic shock-improving.  Still requiring Neo-Synephrine -Sputum cultures with MRSA.  Currently on vancomycin.  Afebrile.  7.  DVT prophylaxis-on heparin drip at this time   All the records are reviewed and case discussed with Care Management/Social Workerr. Management plans discussed with the patient, family and they are in agreement.  CODE STATUS: Full code  TOTAL TIME TAKING CARE OF THIS PATIENT: 33 minutes.   POSSIBLE D/C IN ? DAYS, DEPENDING ON CLINICAL CONDITION.   Enid Baas M.D on 04/15/2018 at 8:11 AM  Between 7am to 6pm - Pager - [phone]  After 6pm go to [email or web address] - Social research officer, government  [facility]  Office  [phone]  CC: Primary care physician; Erasmo Downer, MD

## 2018-04-15 NOTE — Progress Notes (Signed)
Pharmacy Electrolyte Monitoring Consult:  Pharmacy consulted to assist in monitoring and replacing electrolytes in this 47 y.o. male admitted on 04/07/2018 s/p cardiac arrest and requiring mechanical ventilation.   Patient receiving 20mg  of IV furosemide daily  Labs:  Sodium (mmol/L)  Date Value  04/15/2018 139  03/29/2018 142   Potassium (mmol/L)  Date Value  04/15/2018 4.5   Magnesium (mg/dL)  Date Value  96/04/540906/03/2018 2.0   Phosphorus (mg/dL)  Date Value  81/19/147806/03/2018 4.2   Calcium (mg/dL)  Date Value  29/56/213006/06/2018 8.4 (L)   Albumin (g/dL)  Date Value  86/57/846906/03/2018 2.9 (L)  12/09/2017 4.2    Assessment/Plan: No replacement warranted.   Will recheck all electrolytes with am labs.   Pharmacy will continue to monitor and adjust per consult.   Adelee Alexander M Alexander Alexander,  04/15/2018 11:58 AM

## 2018-04-15 NOTE — Progress Notes (Signed)
[place] for heparin drip management Indication: atrial fibrillation and aortic valve replacement   Allergies  Allergen Reactions  . Lexapro [Escitalopram Oxalate] Swelling    Patient Measurements: Height: 5' 10"  (177.8 cm)(based on previous notes) Weight: (!) 400 lb 9.2 oz (181.7 kg) IBW/kg (Calculated) : 73 Heparin Dosing Weight: 111kg  Vital Signs: Temp: 99.6 F (37.6 C) (06/07 2320) Temp Source: Bladder (06/07 2320) BP: 95/70 (06/08 0300) Pulse Rate: 100 (06/08 0300)  Labs: Recent Labs    04/13/18 0424 04/14/18 0501 04/14/18 1140 04/14/18 2048 04/15/18 0421  HGB  --  14.3  --   --   --   HCT  --  43.6  --   --   --   PLT  --  168  --   --   --   LABPROT 14.0 15.4*  --   --   --   INR 1.09 1.23  --   --   --   HEPARINUNFRC 0.40 0.26* 0.28* 0.41 0.56  CREATININE 2.14* 1.89*  --   --  2.38*    Estimated Creatinine Clearance: 62.5 mL/min (A) (by C-G formula based on SCr of 2.38 mg/dL (H)).   Medical History: Past Medical History:  Diagnosis Date  . Allergy   . Anxiety   . Arrhythmia   . Arthritis   . Atrial fibrillation ([place])   . CHF (congestive heart failure) ([place])   . CKD (chronic kidney disease)   . Diabetes mellitus without complication ([place])   . Diabetes mellitus, type II ([place])   . Heart failure ([place])   . Hyperlipidemia   . Hypertension   . Pancreatitis   . Sleep apnea     Medications:  Scheduled:  . chlorhexidine gluconate (MEDLINE KIT)  15 mL Mouth Rinse BID  . famotidine  20 mg Per Tube BID  . feeding supplement (GLUCERNA 1.5 CAL)  1,000 mL Per Tube Q24H  . feeding supplement (PRO-STAT SUGAR FREE 64)  60 mL Per Tube 5 X Daily  . furosemide  20 mg Intravenous Daily  . mouth rinse  15 mL Mouth Rinse 10 times per day  . multivitamin  15 mL Per Tube Daily  . sodium chloride flush  10-40 mL Intracatheter Q12H   Infusions:  . sodium chloride    . amiodarone 60 mg/hr (04/15/18 0121)  . fentaNYL  infusion INTRAVENOUS 225 mcg/hr (04/15/18 0414)  . heparin 2,150 Units/hr (04/14/18 1800)  . insulin (NOVOLIN-R) infusion 13.4 Units/hr (04/15/18 0410)  . levETIRAcetam Stopped (04/14/18 2206)  . phenylephrine (NEO-SYNEPHRINE) Adult infusion 10.133 mcg/min (04/14/18 1800)  . propofol (DIPRIVAN) infusion 15 mcg/kg/min (04/15/18 0047)  . vancomycin Stopped (04/15/18 7253)    Assessment: Pharmacy consulted for warfarin and heparin drip management for 47 yo male admitted to the ICU s/p cardiac arrest. Patient has history significant for aortic valve replacement, atrial fibrillation and CHF. Head CT is negative for infarction or hemorrhage. Heparin level for today continues to be subtherapeutic at 0.28, with a goal of 0.3-0.7 units/mL. Confirmed with RN that heparin drip has not been interrupted. Per protocol, will re-bolus heparin and increase rate of heparin drip. Will obtain heparin level at 2030 and continue to monitor and adjust as needed.  Patient takes warfarin 77m daily as an outpatient for goal INR 2.5-3.5.    Goal of Therapy:  INR 2-3 Heparin level 0.3-0.7 units/ml Monitor platelets by anticoagulation protocol: Yes   Plan:  06/08 @ 0430 HL 0.56 therapeutic. Will  continue @ 2150 units/hr and will recheck w/ am labs.  Tobie Lords, PharmD, BCPS Clinical Pharmacist 04/15/2018

## 2018-04-16 ENCOUNTER — Inpatient Hospital Stay: Payer: Medicare HMO

## 2018-04-16 LAB — BASIC METABOLIC PANEL
Anion gap: 10 (ref 5–15)
BUN: 70 mg/dL — ABNORMAL HIGH (ref 6–20)
CHLORIDE: 103 mmol/L (ref 101–111)
CO2: 27 mmol/L (ref 22–32)
CREATININE: 2.26 mg/dL — AB (ref 0.61–1.24)
Calcium: 8.6 mg/dL — ABNORMAL LOW (ref 8.9–10.3)
GFR calc Af Amer: 38 mL/min — ABNORMAL LOW (ref >60)
GFR calc non Af Amer: 33 mL/min — ABNORMAL LOW (ref >60)
Glucose, Bld: 148 mg/dL — ABNORMAL HIGH (ref 65–99)
POTASSIUM: 3.8 mmol/L (ref 3.5–5.1)
Sodium: 140 mmol/L (ref 135–145)

## 2018-04-16 LAB — GLUCOSE, CAPILLARY
GLUCOSE-CAPILLARY: 145 mg/dL — AB (ref 65–99)
GLUCOSE-CAPILLARY: 132 mg/dL — AB (ref 65–99)
GLUCOSE-CAPILLARY: 149 mg/dL — AB (ref 65–99)
GLUCOSE-CAPILLARY: 113 mg/dL — AB (ref 65–99)
GLUCOSE-CAPILLARY: 156 mg/dL — AB (ref 65–99)
GLUCOSE-CAPILLARY: 152 mg/dL — AB (ref 65–99)
GLUCOSE-CAPILLARY: 168 mg/dL — AB (ref 65–99)
GLUCOSE-CAPILLARY: 192 mg/dL — AB (ref 65–99)
GLUCOSE-CAPILLARY: 176 mg/dL — AB (ref 65–99)
GLUCOSE-CAPILLARY: 160 mg/dL — AB (ref 65–99)
Glucose-Capillary: 151 mg/dL — ABNORMAL HIGH (ref 65–99)
Glucose-Capillary: 156 mg/dL — ABNORMAL HIGH (ref 65–99)
Glucose-Capillary: 155 mg/dL — ABNORMAL HIGH (ref 65–99)
Glucose-Capillary: 161 mg/dL — ABNORMAL HIGH (ref 65–99)
Glucose-Capillary: 171 mg/dL — ABNORMAL HIGH (ref 65–99)
Glucose-Capillary: 150 mg/dL — ABNORMAL HIGH (ref 65–99)
Glucose-Capillary: 172 mg/dL — ABNORMAL HIGH (ref 65–99)
Glucose-Capillary: 158 mg/dL — ABNORMAL HIGH (ref 65–99)
Glucose-Capillary: 170 mg/dL — ABNORMAL HIGH (ref 65–99)
Glucose-Capillary: 177 mg/dL — ABNORMAL HIGH (ref 65–99)
Glucose-Capillary: 162 mg/dL — ABNORMAL HIGH (ref 65–99)
Glucose-Capillary: 176 mg/dL — ABNORMAL HIGH (ref 65–99)
Glucose-Capillary: 162 mg/dL — ABNORMAL HIGH (ref 65–99)

## 2018-04-16 LAB — HEPARIN LEVEL (UNFRACTIONATED): HEPARIN UNFRACTIONATED: 0.31 [IU]/mL (ref 0.30–0.70)

## 2018-04-16 LAB — CBC WITH DIFFERENTIAL/PLATELET
BASOS PCT: 0 %
BLASTS: 0 %
Band Neutrophils: 0 %
Basophils Absolute: 0 10*3/uL (ref 0–0.1)
Eosinophils Absolute: 0 10*3/uL (ref 0–0.7)
Eosinophils Relative: 0 %
HEMATOCRIT: 40.7 % (ref 40.0–52.0)
HEMOGLOBIN: 13.2 g/dL (ref 13.0–18.0)
Lymphocytes Relative: 21 %
Lymphs Abs: 3 10*3/uL (ref 1.0–3.6)
MCH: 28 pg (ref 26.0–34.0)
MCHC: 32.6 g/dL (ref 32.0–36.0)
MCV: 86 fL (ref 80.0–100.0)
MYELOCYTES: 0 %
Metamyelocytes Relative: 0 %
Monocytes Absolute: 2.5 10*3/uL — ABNORMAL HIGH (ref 0.2–1.0)
Monocytes Relative: 18 %
NEUTROS PCT: 61 %
NRBC: 3 /100{WBCs} — AB
Neutro Abs: 8.6 10*3/uL — ABNORMAL HIGH (ref 1.4–6.5)
Other: 0 %
PROMYELOCYTES RELATIVE: 0 %
Platelets: 185 10*3/uL (ref 150–440)
RBC: 4.73 MIL/uL (ref 4.40–5.90)
RDW: 18.2 % — ABNORMAL HIGH (ref 11.5–14.5)
WBC: 14.1 10*3/uL — AB (ref 3.8–10.6)

## 2018-04-16 LAB — TRIGLYCERIDES: TRIGLYCERIDES: 134 mg/dL (ref <150)

## 2018-04-16 LAB — VANCOMYCIN, TROUGH: VANCOMYCIN TR: 32 ug/mL — AB (ref 15–20)

## 2018-04-16 LAB — MAGNESIUM: MAGNESIUM: 2.5 mg/dL — AB (ref 1.7–2.4)

## 2018-04-16 LAB — POTASSIUM: POTASSIUM: 3.7 mmol/L (ref 3.5–5.1)

## 2018-04-16 NOTE — Progress Notes (Signed)
Follow up - Critical Care Medicine Note  Patient Details:    Alexander Alexander is an 47 y.o. male. 47 year old gentleman with past medical history of atrial fibrillation, status post ablation and aortic valve replacement on warfarin, history of congestive heart failure, chronic renal disease, morbid obesity, diabetes, hyperlipidemia, obstructive sleep apnea and hypertension, suffered a pulseless V. Tach cardiac arrest in MRI when he was being evaluated for back pain and sciatica, subsequently intubated in the intensive care unit.  Lines, Airways, Drains: Airway 7.5 mm (Active)  Secured at (cm) 24 cm 04/10/2018  4:16 AM  Measured From Lips 04/10/2018  4:16 AM  Secured Location Right 04/10/2018  4:16 AM  Secured By [employer] 04/10/2018  4:16 AM  Tube Holder Repositioned Yes 04/10/2018  2:21 AM  Cuff Pressure (cm H2O) 26 cm H2O 04/09/2018  6:11 PM  Site Condition Dry 04/10/2018  4:16 AM     CVC Triple Lumen 04/07/18 Right External jugular (Active)  Indication for Insertion or Continuance of Line Vasoactive infusions;Prolonged intravenous therapies 04/09/2018  8:00 PM  Site Assessment Clean;Dry;Intact 04/09/2018  8:00 PM  Proximal Lumen Status Infusing;Flushed 04/09/2018  8:00 PM  Medial Lumen Status Infusing;Flushed 04/09/2018  8:00 PM  Distal Lumen Status Infusing;Flushed 04/09/2018  8:00 PM  Dressing Type Transparent 04/09/2018  8:00 PM  Dressing Status Clean;Dry;Intact;Antimicrobial disc in place 04/09/2018  8:00 PM  Line Care Connections checked and tightened 04/09/2018  8:00 PM  Dressing Intervention Dressing changed;Antimicrobial disc changed 04/10/2018  4:31 AM  Dressing Change Due 04/17/18 04/10/2018  4:31 AM     NG/OG Tube Orogastric 18 Fr. Right mouth Aucultation 55 cm (Active)  Cm Marking at Nare/Corner of Mouth (if applicable) 55 cm 04/10/2018  4:16 AM  Site Assessment Clean;Dry;Intact 04/10/2018  4:16 AM  Ongoing Placement Verification No change in cm markings or external length of tube from initial  placement;No change in respiratory status;No acute changes, not attributed to clinical condition 04/10/2018  4:16 AM  Status Infusing tube feed 04/10/2018  4:16 AM  Intake (mL) 30 mL 04/08/2018  5:00 PM     Urethral Catheter Myra Temperature probe 16 Fr. (Active)  Indication for Insertion or Continuance of Catheter Other (comment) 04/10/2018  4:16 AM  Site Assessment Clean;Intact;Dry 04/10/2018  4:16 AM  Catheter Maintenance Bag below level of bladder;Catheter secured;Drainage bag/tubing not touching floor;Seal intact;No dependent loops;Insertion date on drainage bag;Bag emptied prior to transport 04/10/2018  4:16 AM  Collection Container Standard drainage bag 04/10/2018  4:16 AM  Securement Method Securing device (Describe) 04/10/2018  4:16 AM  Urinary Catheter Interventions Unclamped 04/10/2018  4:16 AM  Output (mL) 75 mL 04/10/2018  5:41 AM    Anti-infectives:  Anti-infectives (From admission, onward)   Start     Dose/Rate Route Frequency Ordered Stop   04/14/18 1430  vancomycin (VANCOCIN) IVPB 1000 mg/200 mL premix     1,000 mg 200 mL/hr over 60 Minutes Intravenous Every 12 hours 04/14/18 1348     04/11/18 2000  vancomycin (VANCOCIN) 1,250 mg in sodium chloride 0.9 % 250 mL IVPB  Status:  Discontinued     1,250 mg 166.7 mL/hr over 90 Minutes Intravenous Every 18 hours 04/11/18 1548 04/14/18 1348   04/10/18 1400  vancomycin (VANCOCIN) 1,250 mg in sodium chloride 0.9 % 250 mL IVPB  Status:  Discontinued     1,250 mg 166.7 mL/hr over 90 Minutes Intravenous Every 12 hours 04/10/18 1149 04/11/18 1548   04/10/18 1200  Ampicillin-Sulbactam (UNASYN) 3 g in sodium  chloride 0.9 % 100 mL IVPB  Status:  Discontinued     3 g 200 mL/hr over 30 Minutes Intravenous Every 6 hours 04/10/18 0851 04/10/18 1131   04/09/18 1800  vancomycin (VANCOCIN) 1,500 mg in sodium chloride 0.9 % 500 mL IVPB  Status:  Discontinued     1,500 mg 250 mL/hr over 120 Minutes Intravenous Every 18 hours 04/09/18 1303 04/10/18 0750   04/09/18  1045  vancomycin (VANCOCIN) IVPB 1000 mg/200 mL premix     1,000 mg 200 mL/hr over 60 Minutes Intravenous  Once 04/09/18 1041 04/09/18 1307   04/07/18 1400  piperacillin-tazobactam (ZOSYN) IVPB 3.375 g  Status:  Discontinued     3.375 g 12.5 mL/hr over 240 Minutes Intravenous Every 8 hours 04/07/18 1015 04/10/18 0851      Microbiology: Results for orders placed or performed during the hospital encounter of 04/07/18  MRSA PCR Screening     Status: None   Collection Time: 04/07/18  9:49 AM  Result Value Ref Range Status   MRSA by PCR NEGATIVE NEGATIVE Final    Comment:        The GeneXpert MRSA Assay (FDA approved for NASAL specimens only), is one component of a comprehensive MRSA colonization surveillance program. It is not intended to diagnose MRSA infection nor to guide or monitor treatment for MRSA infections. Performed at [facility], [address] [place], [place] [address]   Culture, respiratory (NON-Expectorated)     Status: None   Collection Time: 04/08/18 10:53 AM  Result Value Ref Range Status   Specimen Description   Final    TRACHEAL ASPIRATE Performed at [facility], [address] [place], [place] [address]    Special Requests   Final    Normal Performed at [facility], [address] [place], [place] [address]    Gram Stain   Final    ABUNDANT WBC PRESENT, PREDOMINANTLY PMN NO SQUAMOUS EPITHELIAL CELLS SEEN MODERATE GRAM POSITIVE COCCI IN CLUSTERS FEW GRAM NEGATIVE COCCOBACILLI RARE GRAM POSITIVE RODS Performed at [facility], [address] [place], [place] [address]    Culture   Final    ABUNDANT METHICILLIN RESISTANT STAPHYLOCOCCUS AUREUS   Report Status 04/10/2018 FINAL  Final   Organism ID, Bacteria METHICILLIN RESISTANT STAPHYLOCOCCUS AUREUS  Final      Susceptibility   Methicillin resistant staphylococcus aureus - MIC*    CIPROFLOXACIN >=8 RESISTANT Resistant     ERYTHROMYCIN >=8 RESISTANT Resistant      GENTAMICIN <=0.5 SENSITIVE Sensitive     OXACILLIN >=4 RESISTANT Resistant     TETRACYCLINE <=1 SENSITIVE Sensitive     VANCOMYCIN 1 SENSITIVE Sensitive     TRIMETH/SULFA <=10 SENSITIVE Sensitive     CLINDAMYCIN <=0.25 SENSITIVE Sensitive     RIFAMPIN <=0.5 SENSITIVE Sensitive     Inducible Clindamycin NEGATIVE Sensitive     * ABUNDANT METHICILLIN RESISTANT STAPHYLOCOCCUS AUREUS  CULTURE, BLOOD (ROUTINE X 2) w Reflex to ID Panel     Status: None   Collection Time: 04/08/18 11:43 AM  Result Value Ref Range Status   Specimen Description BLOOD LAC  Final   Special Requests   Final    BOTTLES DRAWN AEROBIC AND ANAEROBIC Blood Culture adequate volume   Culture   Final    NO GROWTH 5 DAYS Performed at [facility], [address] [place], [place] [address]    Report Status 04/13/2018 FINAL  Final  CULTURE, BLOOD (ROUTINE X 2) w Reflex  to ID Panel     Status: None   Collection Time: 04/08/18 11:43 AM  Result Value Ref Range Status   Specimen Description BLOOD LT HAND  Final   Special Requests   Final    BOTTLES DRAWN AEROBIC AND ANAEROBIC Blood Culture adequate volume   Culture   Final    NO GROWTH 5 DAYS Performed at [facility], [address], [place], [place] [address]    Report Status 04/13/2018 FINAL  Final  Urine Culture     Status: None   Collection Time: 04/08/18  1:16 PM  Result Value Ref Range Status   Specimen Description   Final    URINE, CATHETERIZED Performed at [facility], [address] [place], [place] [address]    Special Requests   Final    Normal Performed at [facility], [address] [place], [place] [address]    Culture   Final    NO GROWTH Performed at [facility], [address], [place], [place] [address]    Report Status 04/09/2018 FINAL  Final  Urine Culture     Status: None   Collection Time: 04/12/18  6:55 AM  Result Value Ref Range Status   Specimen Description   Final    URINE,  RANDOM Performed at [facility], [address] [place], [place] [address]    Special Requests   Final    NONE Performed at [facility], [address] [place], [place] [address]    Culture   Final    NO GROWTH Performed at [facility], [address] [place], [place] [address]    Report Status 04/13/2018 FINAL  Final  CULTURE, BLOOD (ROUTINE X 2) w Reflex to ID Panel     Status: None (Preliminary result)   Collection Time: 04/12/18  7:30 AM  Result Value Ref Range Status   Specimen Description BLOOD BLOOD LEFT HAND  Final   Special Requests   Final    BOTTLES DRAWN AEROBIC AND ANAEROBIC Blood Culture adequate volume   Culture   Final    NO GROWTH 4 DAYS Performed at [facility], [address] [place], [place] [address]    Report Status PENDING  Incomplete  CULTURE, BLOOD (ROUTINE X 2) w Reflex to ID Panel     Status: None (Preliminary result)   Collection Time: 04/12/18  7:46 AM  Result Value Ref Range Status   Specimen Description BLOOD BLOOD LEFT HAND  Final   Special Requests   Final    BOTTLES DRAWN AEROBIC AND ANAEROBIC Blood Culture adequate volume   Culture   Final    NO GROWTH 4 DAYS Performed at [facility], [address] [place], [place] [address]    Report Status PENDING  Incomplete  Culture, respiratory (NON-Expectorated)     Status: None   Collection Time: 04/12/18  8:11 AM  Result Value Ref Range Status   Specimen Description   Final    TRACHEAL ASPIRATE Performed at [facility], [address], [place], [place] [address]    Special Requests   Final    NONE Performed at [facility], [address], [place], [place] [address]    Gram Stain   Final    FEW WBC PRESENT, PREDOMINANTLY PMN FEW GRAM POSITIVE COCCI Performed at [facility], [address], [place], [place] [address]    Culture FEW METHICILLIN RESISTANT STAPHYLOCOCCUS AUREUS  Final   Report Status 04/15/2018  FINAL  Final   Organism ID, Bacteria METHICILLIN RESISTANT STAPHYLOCOCCUS AUREUS  Final      Susceptibility   Methicillin resistant staphylococcus aureus - MIC*    CIPROFLOXACIN >=8 RESISTANT Resistant     ERYTHROMYCIN >=8 RESISTANT Resistant     GENTAMICIN <=0.5 SENSITIVE Sensitive     OXACILLIN >=4 RESISTANT Resistant     TETRACYCLINE <=1 SENSITIVE Sensitive     VANCOMYCIN 1 SENSITIVE Sensitive     TRIMETH/SULFA <=10 SENSITIVE Sensitive     CLINDAMYCIN <=0.25 SENSITIVE Sensitive     RIFAMPIN <=0.5 SENSITIVE Sensitive     Inducible Clindamycin NEGATIVE Sensitive     * FEW METHICILLIN RESISTANT STAPHYLOCOCCUS AUREUS  C difficile quick scan w PCR reflex     Status: None   Collection Time: 04/12/18  3:05 PM  Result Value Ref Range Status   C Diff antigen NEGATIVE NEGATIVE Final   C Diff toxin NEGATIVE NEGATIVE Final   C Diff interpretation No C. difficile detected.  Final    Comment: Performed at [facility], [address] [place], [place] [address]   Last 24 hours Events: patient has required 55% FiO2 and 10 of PEEP. Has been successfully weaned off of pressors    Dg Chest 1 View  Result Date: 04/07/2018 CLINICAL DATA:  47 y/o  M; intubated. EXAM: CHEST  1 VIEW COMPARISON:  12/06/2017 chest radiograph FINDINGS: Stable cardiomegaly given projection and technique. Transcutaneous pacing pads. Post median sternotomy with wires in alignment. Low lung volumes. Pulmonary vascular congestion. No focal consolidation. Endotracheal tube tip 2.4 cm above the carina. Enteric tube tip appears to project over the lower esophagus, advancement recommended. IMPRESSION: 1. Endotracheal tube tip 2.4 cm above the carina. 2. Cardiomegaly and pulmonary vascular congestion. 3. Enteric tube tip appears to project over the lower esophagus, advancement recommended. Electronically Signed   By: Mitzi Hansen M.D.   On: 04/07/2018 06:22   Ct Head Wo Contrast  Result Date: 04/09/2018 CLINICAL  DATA:  47 year old with acute mental status changes after cardiac arrest. Possible new onset seizures. EXAM: CT HEAD WITHOUT CONTRAST TECHNIQUE: Contiguous axial images were obtained from the base of the skull through the vertex without intravenous contrast. COMPARISON:  04/07/2018. FINDINGS: Brain: Ventricular system normal in size and appearance for age. Physiologic calcifications in the basal ganglia bilaterally as noted previously. No mass lesion. No midline shift. No acute hemorrhage or hematoma. No extra-axial fluid collections. No evidence of acute infarction. No evidence of cerebral edema at this time. Vascular: Moderate BILATERAL carotid siphon and vertebrobasilar atherosclerosis. No hyperdense vessel. Skull: No skull fracture or other focal osseous abnormality involving the skull. Sinuses/Orbits: Visualized paranasal sinuses, bilateral mastoid air cells and bilateral middle ear cavities well-aerated. Visualized orbits and globes normal in appearance. Other: None. IMPRESSION: No acute intracranial abnormality. Electronically Signed   By: Hulan Saas M.D.   On: 04/09/2018 11:20   Ct Head Wo Contrast  Result Date: 04/07/2018 CLINICAL DATA:  Cardiac arrest EXAM: CT HEAD WITHOUT CONTRAST TECHNIQUE: Contiguous axial images were obtained from the base of the skull through the vertex without intravenous contrast. COMPARISON:  None. FINDINGS: Brain: No evidence of acute infarction, hemorrhage, hydrocephalus, extra-axial collection or mass lesion/mass effect. Vascular: No hyperdense vessel or unexpected calcification. Skull: Negative Sinuses/Orbits: Negative Other: None IMPRESSION: Negative CT head Electronically Signed   By: Marlan Palau M.D.   On: 04/07/2018 15:25   Dg Chest Port 1 View  Result Date: 04/14/2018  CLINICAL DATA:  Hypoxia EXAM: PORTABLE CHEST 1 VIEW COMPARISON:  April 12, 2018 FINDINGS: Endotracheal tube tip is 2.0 cm above the carina. Nasogastric tube tip and side port are below the  diaphragm. Central catheter tip is at the cavoatrial junction. No evident pneumothorax. There is focal consolidation in the right base. Lungs elsewhere appear clear. Note that there is overlying artifact from the cooling mat. Heart is mildly enlarged with pulmonary vascularity normal. No bone lesions. No adenopathy evident. IMPRESSION: Tube and catheter positions as described without pneumothorax. Consolidation right base, felt to represent focal pneumonia. No similar changes elsewhere. Stable cardiac prominence. Electronically Signed   By: Bretta BangWilliam  Woodruff III M.D.   On: 04/14/2018 09:16   Dg Chest Port 1 View  Result Date: 04/12/2018 CLINICAL DATA:  Dyspnea. Endotracheal tube present. Status post cardiac arrest. EXAM: PORTABLE CHEST 1 VIEW COMPARISON:  04/10/2018 FINDINGS: Endotracheal tube, right jugular central venous catheter, and nasogastric tube remain in appropriate position. Heart size is stable. Low lung volumes are seen. Atelectasis again seen in both lung bases. No new or worsening areas of pulmonary opacity are seen. No evidence of pulmonary consolidation or pneumothorax. IMPRESSION: No significant change in low lung volumes and bibasilar atelectasis. Electronically Signed   By: Myles RosenthalJohn  Stahl M.D.   On: 04/12/2018 10:07   Dg Chest Port 1 View  Result Date: 04/10/2018 CLINICAL DATA:  Acute respiratory failure EXAM: PORTABLE CHEST 1 VIEW COMPARISON:  Earlier today at 1004 hours FINDINGS: Mildly degraded exam due to AP portable technique and patient body habitus. Endotracheal tube terminates 3.3 cm above carina.Prior median sternotomy. Nasogastric tube not well visualized distally but likely extends beyond the inferior aspect of the film. Right internal jugular line tip at low SVC. Cardiomegaly accentuated by AP portable technique. Small bilateral pleural effusions. No pneumothorax. Mild interstitial edema, superimposed upon low lung volumes. Bibasilar airspace disease. IMPRESSION: Decreased  sensitivity and specificity exam due to technique related factors, as described above. Cardiomegaly with mild congestive heart failure, low lung volumes, and layering bilateral pleural effusions. Bibasilar airspace disease which is most likely atelectasis. Electronically Signed   By: Jeronimo GreavesKyle  Talbot M.D.   On: 04/10/2018 19:42   Dg Chest Port 1 View  Result Date: 04/10/2018 CLINICAL DATA:  Hypoxia EXAM: PORTABLE CHEST 1 VIEW COMPARISON:  April 09, 2018 FINDINGS: Endotracheal tube tip is 4.3 cm above the carina. Central catheter tip is at cavoatrial junction. Nasogastric tube tip and side port below the diaphragm. No pneumothorax. There is patchy airspace consolidation in both medial lung bases with small pleural effusions bilaterally. Lungs elsewhere clear. Heart is mildly enlarged with pulmonary vascularity normal. No adenopathy. Patient is status post median sternotomy. No bone lesions evident. IMPRESSION: Tube and catheter positions as described without pneumothorax. Suspect bibasilar pneumonia. Small pleural effusions. Stable cardiac prominence. Electronically Signed   By: Bretta BangWilliam  Woodruff III M.D.   On: 04/10/2018 10:27   Dg Chest Port 1 View  Result Date: 04/09/2018 CLINICAL DATA:  Pneumonia EXAM: PORTABLE CHEST 1 VIEW COMPARISON:  April 08, 2018 FINDINGS: The ETT is in good position. The distal NG tube terminates below today's film. Stable cardiomegaly. Bibasilar opacities may represent small effusions with underlying atelectasis, stable on the right and mildly increased on the left. The right IJ is stable. No other interval changes. IMPRESSION: 1. Support apparatus as above. 2. Bibasilar opacities may represent small effusions and underlying atelectasis, stable on the right and mildly increased on the left in the interval. Electronically Signed  By: Gerome Sam III M.D   On: 04/09/2018 07:14   Dg Chest Port 1 View  Result Date: 04/08/2018 CLINICAL DATA:  Followup pneumonia. EXAM: PORTABLE CHEST 1  VIEW COMPARISON:  04/07/2018 and older exams. FINDINGS: Lung volumes are low. This accentuates the appearance of vascular congestion and interstitial prominence. There is mild linear atelectasis in the right mid lung, stable from the prior exam. Bilateral lung base opacity is most likely atelectasis. Pneumonia is possible. No overt pulmonary edema. Is Stable changes from prior cardiac surgery. Cardiac silhouette is mildly enlarged. Endotracheal tube, right internal jugular central venous line and nasal/orogastric tube are stable. IMPRESSION: 1. No significant change from the previous day's study. 2. Vascular congestion with interstitial prominence, but without overt pulmonary edema. 3. Basilar atelectasis.  Cannot exclude pneumonia. Electronically Signed   By: Amie Portland M.D.   On: 04/08/2018 07:23   Dg Chest Portable 1 View  Result Date: 04/07/2018 CLINICAL DATA:  Central catheter placement.  Hypoxia. EXAM: PORTABLE CHEST 1 VIEW COMPARISON:  Apr 07, 2018 study obtained earlier in the day FINDINGS: Right jugular catheter tip is in the superior vena cava near the cavoatrial junction. Endotracheal tube tip is 4.0 cm above the carina. Nasogastric tube tip and side port are below the diaphragm. No pneumothorax. There is bibasilar atelectasis. Lungs elsewhere are clear. 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 No adenopathy. No bone lesions. Patient is status post median sternotomy with aortic valve replacement. IMPRESSION: Tube and catheter positions as described without pneumothorax. There is pulmonary vascular congestion. Bibasilar atelectasis present. No consolidation evident. Electronically Signed   By: Bretta Bang III M.D.   On: 04/07/2018 08:51   Dg Abd Portable 1v  Result Date: 04/10/2018 CLINICAL DATA:  Status post OG tube placement. EXAM: PORTABLE ABDOMEN - 1 VIEW COMPARISON:  Single view of the abdomen 04/07/2018. FINDINGS: OG tube tip  and sideport are both in the stomach.  IMPRESSION: OG tube in good position. Electronically Signed   By: Drusilla Kanner M.D.   On: 04/10/2018 14:32   Dg Abd Portable 1v  Result Date: 04/07/2018 CLINICAL DATA:  NG placement EXAM: PORTABLE ABDOMEN - 1 VIEW COMPARISON:  None. FINDINGS: Limited exam due to large patient size and difficulty with positioning. NG tip in the proximal stomach. NG side hole in the distal esophagus. No dilated bowel loops. IMPRESSION: NG tube in the proximal stomach with the side hole in the distal esophagus. Recommend advancing at least 5 cm. Electronically Signed   By: Marlan Palau M.D.   On: 04/07/2018 10:48    Consults: Treatment Team:  Lamar Blinks, MD Uvaldo Rising, MD Anson Fret, MD Thana Farr, MD   Objective:  Vital signs for last 24 hours: Temp:  [98.7 F (37.1 C)-101 F (38.3 C)] 101 F (38.3 C) (06/09 0343) Pulse Rate:  [96-122] 115 (06/09 0700) Resp:  [0-29] 20 (06/09 0700) BP: (94-169)/(65-100) 126/82 (06/09 0700) SpO2:  [86 %-94 %] 89 % (06/09 0753) FiO2 (%):  [50 %-60 %] 55 % (06/09 0753) Weight:  [413 lb (187.3 kg)] 413 lb (187.3 kg) (06/09 0400)  Hemodynamic parameters for last 24 hours:    Intake/Output from previous day: 06/08 0701 - 06/09 0700 In: 4179 [I.V.:2909; NG/GT:870; IV Piggyback:400] Out: 5409 [Urine:2975; Stool:400]  Intake/Output this shift: No intake/output data recorded.  Vent settings for last 24 hours: Vent Mode: PRVC FiO2 (%):  [50 %-60 %] 55 % Set Rate:  [20 bmp] 20 bmp Vt Set:  [  550 mL] 550 mL PEEP:  [10 cmH20] 10 cmH20 Plateau Pressure:  [17 cmH20-29 cmH20] 22 cmH20  Physical Exam:  Patient is arousable but somnolent. Presently on propofol HEENT: Patient is orally intubated, central line noted, trachea is midline, no accessory muscle utilization Cardiovascular: Atrial fibrillation noted on monitor, irregularly irregular rhythm with ventricular response averaging in the low 100s Pulmonary: Basilar crackles  noted Abdominal: Distended abdomen, bowel sounds noted Extremity: 1+ edema noted Neurologic: Patient moves all extremities Cutaneous: No rashes or lesions noted  Assessment/Plan:    Acute respiratory failurewith hypoxemia status post cardiac arrest. combination of volume overload and pneumonia. Patient has grown staph species and sputum presently on vancomycin. Presently on 55% and 10 of PEEP without significant improvement throughout the day yesterday. Discussed with wife goals of care consideration for comfort care versus tracheostomy if patient does not have significant improvement  Pulseless V. tach cardiac arrest. ECHO LVEF 55-60%.Cardiology arrest was secondary to respiratory issues  Altered mental status. appreciate neurology input. CT scan of the head was negative, no evidence of anoxic brain injury, presently empirically started on Keppra for possible partial seizure  Leukocytosis. On broad spectrum coverage.  AKI on CKD.will repeat BMP this morning. If renal function is stable or improving will consider additional dose of diuretic secondary to improved hemodynamics  Hyperglycemia. On insulin infusion secondary to uncontrolled blood sugars  S/p Mechanical aortic valve replacement.on anticoagulation   Critical Care Total Time 35 minutes  Nga Rabon 04/16/2018  *Care during the described time interval was provided by me and/or other providers on the critical care team.  I have reviewed this patient's available data, including medical history, events of note, physical examination and test results as part of my evaluation. Patient ID: Jarome Trull, male   DOB: 08/12/1971, 47 y.o.   MRN: [id number] Patient ID: Soham Hollett, male   DOB: 1971-06-13, 47 y.o.   MRN: [id number] Patient ID: Tosh Glaze, male   DOB: June 08, 1971, 47 y.o.   MRN: [id number] Patient ID: Zacharias Ridling, male   DOB: [birth year], 46 y.o.   MRN: [id number]

## 2018-04-16 NOTE — Progress Notes (Signed)
Pharmacy consulted for electrolyte replacement protocol:   Goal of therapy: Electrolytes within normal limits:  K 3.5 - 5.1 Corrected Ca 8.9 - 10.3 Phos 2.5 - 4.6 Mg 1.7 - 2.4   Assessment: Lab Results  Component Value Date   CREATININE 2.26 (H) 04/16/2018   BUN 70 (H) 04/16/2018   NA 140 04/16/2018   K 3.8 04/16/2018   CL 103 04/16/2018   CO2 27 04/16/2018    Plan: Electrolytes WNL, no supplementation needed, will reorder labs with AM labs tomorrow.    Luan PullingGarrett Nealy Karapetian, PharmD, MBA, Liz ClaiborneBCGP Clinical Pharmacist [facility]

## 2018-04-16 NOTE — Progress Notes (Signed)
[employer] -  at [facility]   PATIENT NAME: Alexander Alexander    MR#:  [id number]  DATE OF BIRTH:  [birth year]  SUBJECTIVE:  CHIEF COMPLAINT:   Chief Complaint  Patient presents with  . Back Pain   -Patient is status post cardiac arrest.  Obese male. -Intubated and on ventilator.  Has thick respiratory secretions.  Being treated for MRSA pneumonia. -On amiodarone drip for A. fib RVR, heparin drip for A. fib and aortic valve replacement.   - sedation with propofol and fentanyl  REVIEW OF SYSTEMS:  Review of Systems  Unable to perform ROS: Critical illness  Gastrointestinal: Positive for heartburn.    DRUG ALLERGIES:   Allergies  Allergen Reactions  . Lexapro [Escitalopram Oxalate] Swelling    VITALS:  Blood pressure (!) 130/94, pulse (!) 108, temperature 100.1 F (37.8 C), temperature source Bladder, resp. rate 20, height 5\' 10"  (1.778 m), weight (!) 187.3 kg (413 lb), SpO2 (!) 89 %.  PHYSICAL EXAMINATION:  Physical Exam  GENERAL:  47 y.o.-year-old morbidly obese patient lying in the bed, critically ill appearing. EYES: Pupils equal, round, reactive to light and accommodation. No scleral icterus. Extraocular muscles intact.  HEENT: Head atraumatic, normocephalic.  Orally intubated, OG tube present.  Increased secretions noted. NECK:  Supple, no jugular venous distention. No thyroid enlargement, no tenderness.  LUNGS: Coarse breath sounds bilaterally, decreased at the bases.  No wheezing, rales,rhonchi or crepitation. No use of accessory muscles of respiration.  CARDIOVASCULAR: S1, S2 normal. No  rubs, or gallops. Loud 3/6 systolic murmur present ABDOMEN: Soft, obese, nontender, nondistended. Bowel sounds present. No organomegaly or mass.  EXTREMITIES: No pedal edema, cyanosis, or clubbing.  NEUROLOGIC: Patient is sedated today and not following commands PSYCHIATRIC: The patient is sedated today SKIN: No obvious rash, lesion, or ulcer.     LABORATORY PANEL:   CBC Recent Labs  Lab 04/16/18 0430  WBC 14.1*  HGB 13.2  HCT 40.7  PLT 185   ------------------------------------------------------------------------------------------------------------------  Chemistries  Recent Labs  Lab 04/12/18 0421  04/16/18 0430  NA 138   < > 140  K 4.8   < > 3.8  CL 99*   < > 103  CO2 27   < > 27  GLUCOSE 301*   < > 148*  BUN 44*   < > 70*  CREATININE 2.50*   < > 2.26*  CALCIUM 8.6*   < > 8.6*  MG 2.0  --   --   AST 83*  --   --   ALT 37  --   --   ALKPHOS 48  --   --   BILITOT 1.6*  --   --    < > = values in this interval not displayed.   ------------------------------------------------------------------------------------------------------------------  Cardiac Enzymes No results for input(s): TROPONINI in the last 168 hours. ------------------------------------------------------------------------------------------------------------------  RADIOLOGY:  No results found.  EKG:   Orders placed or performed during the hospital encounter of 04/07/18  . EKG 12-Lead  . EKG 12-Lead  . EKG 12-Lead  . EKG 12-Lead    ASSESSMENT AND PLAN:   47 year old male with past medical history significant for A. fib status post ablation, aortic valve replacement on warfarin, history of congestive heart failure, CKD, morbid obesity, diabetes and sleep apnea who was getting an outpatient MRI of his lower back had a cardiac arrest.  1.  Cardiac arrest-from respiratory causes. -Patient has acute hypoxic respiratory failure-currently remains on ventilator.  Still requiring 50%  FiO2 and 10 of PEEP -Sputum cultures positive for MRSA.  Currently on vancomycin. -Continue management per ICU team - If cannot be weaned-  considering trach  2.  Atrial fibrillation-tachycardic. -Remains on amiodarone drip at this time -Receiving heparin drip for anticoagulation.  Also has a history of mechanical aortic valve replacement.  3.  Altered  mental status-likely metabolic encephalopathy. -No anoxic changes.  Appreciate neurology consult -Keppra was started for possible partial seizures  4.  Diabetes mellitus-currently on insulin drip while in the ICU  5.  Acute renal failure on CKD secondary to ATN.  Creatinine is stable around 2. -Continue to monitor urine output  6.  Septic shock-improving. off Neo-Synephrine in the last day -Sputum cultures with MRSA.  Currently on vancomycin.  Afebrile.  7.  DVT prophylaxis-on heparin drip at this time   All the records are reviewed and case discussed with Care Management/Social Workerr. Management plans discussed with the patient, family and they are in agreement.  CODE STATUS: Full code  TOTAL TIME TAKING CARE OF THIS PATIENT: 33 minutes.   POSSIBLE D/C IN ? DAYS, DEPENDING ON CLINICAL CONDITION.   Enid BaasKALISETTI,Evalena Fujii M.D on 04/16/2018 at 10:49 AM  Between 7am to 6pm - Pager - [phone]  After 6pm go to [email or web address] - Social research officer, governmentpassword EPAS [email or web address]  [facility] [place] [facility]  Office  [phone]  CC: Primary care physician; Erasmo DownerBacigalupo, Angela M, MD

## 2018-04-16 NOTE — Progress Notes (Signed)
[place] for heparin drip management Indication: atrial fibrillation and aortic valve replacement   Allergies  Allergen Reactions  . Lexapro [Escitalopram Oxalate] Swelling    Patient Measurements: Height: 5' 10"  (177.8 cm)(based on previous notes) Weight: (!) 413 lb (187.3 kg) IBW/kg (Calculated) : 73 Heparin Dosing Weight: 111kg  Vital Signs: Temp: 101 F (38.3 C) (06/09 0343) Temp Source: Axillary (06/09 0343) BP: 118/66 (06/09 0500) Pulse Rate: 104 (06/09 0500)  Labs: Recent Labs    04/14/18 0501  04/14/18 2048 04/15/18 0421 04/16/18 0430  HGB 14.3  --   --  13.6  --   HCT 43.6  --   --  42.5  --   PLT 168  --   --  183  --   LABPROT 15.4*  --   --   --   --   INR 1.23  --   --   --   --   HEPARINUNFRC 0.26*   < > 0.41 0.56 0.31  CREATININE 1.89*  --   --  2.38*  --    < > = values in this interval not displayed.    Estimated Creatinine Clearance: 63.7 mL/min (A) (by C-G formula based on SCr of 2.38 mg/dL (H)).   Medical History: Past Medical History:  Diagnosis Date  . Allergy   . Anxiety   . Arrhythmia   . Arthritis   . Atrial fibrillation ([place])   . CHF (congestive heart failure) ([place])   . CKD (chronic kidney disease)   . Diabetes mellitus without complication ([place])   . Diabetes mellitus, type II ([place])   . Heart failure ([place])   . Hyperlipidemia   . Hypertension   . Pancreatitis   . Sleep apnea     Medications:  Scheduled:  . chlorhexidine gluconate (MEDLINE KIT)  15 mL Mouth Rinse BID  . famotidine  20 mg Per Tube BID  . feeding supplement (GLUCERNA 1.5 CAL)  1,000 mL Per Tube Q24H  . feeding supplement (PRO-STAT SUGAR FREE 64)  60 mL Per Tube 5 X Daily  . furosemide  20 mg Intravenous Daily  . mouth rinse  15 mL Mouth Rinse 10 times per day  . multivitamin  15 mL Per Tube Daily  . sodium chloride flush  10-40 mL Intracatheter Q12H   Infusions:  . sodium chloride    . amiodarone 60 mg/hr (04/16/18  0400)  . fentaNYL infusion INTRAVENOUS 250 mcg/hr (04/16/18 0400)  . heparin 2,150 Units/hr (04/16/18 0400)  . insulin (NOVOLIN-R) infusion 21.2 Units/hr (04/16/18 0446)  . levETIRAcetam Stopped (04/15/18 2230)  . phenylephrine (NEO-SYNEPHRINE) Adult infusion Stopped (04/15/18 1400)  . propofol (DIPRIVAN) infusion 10 mcg/kg/min (04/16/18 0450)  . vancomycin Stopped (04/16/18 0315)    Assessment: Pharmacy consulted for warfarin and heparin drip management for 47 yo male admitted to the ICU s/p cardiac arrest. Patient has history significant for aortic valve replacement, atrial fibrillation and CHF. Head CT is negative for infarction or hemorrhage. Heparin level for today continues to be subtherapeutic at 0.28, with a goal of 0.3-0.7 units/mL. Confirmed with RN that heparin drip has not been interrupted. Per protocol, will re-bolus heparin and increase rate of heparin drip. Will obtain heparin level at 2030 and continue to monitor and adjust as needed.  Patient takes warfarin 20m daily as an outpatient for goal INR 2.5-3.5.    Goal of Therapy:  INR 2-3 Heparin level 0.3-0.7 units/ml Monitor platelets by anticoagulation protocol: Yes  Plan:  06/09 @ 0500 HL 0.31 therapeutic. Will continue at 2150 units/hr and will recheck anti-Xa w/ am labs.  Tobie Lords, PharmD, BCPS Clinical Pharmacist 04/16/2018

## 2018-04-17 ENCOUNTER — Inpatient Hospital Stay: Payer: Self-pay

## 2018-04-17 ENCOUNTER — Ambulatory Visit: Payer: Medicare HMO | Admitting: Family

## 2018-04-17 ENCOUNTER — Inpatient Hospital Stay: Payer: Medicare HMO

## 2018-04-17 DIAGNOSIS — J9601 Acute respiratory failure with hypoxia: Secondary | ICD-10-CM

## 2018-04-17 DIAGNOSIS — E1165 Type 2 diabetes mellitus with hyperglycemia: Secondary | ICD-10-CM

## 2018-04-17 DIAGNOSIS — Z794 Long term (current) use of insulin: Secondary | ICD-10-CM

## 2018-04-17 DIAGNOSIS — Z9911 Dependence on respirator [ventilator] status: Secondary | ICD-10-CM

## 2018-04-17 DIAGNOSIS — N183 Chronic kidney disease, stage 3 (moderate): Secondary | ICD-10-CM

## 2018-04-17 DIAGNOSIS — I482 Chronic atrial fibrillation: Secondary | ICD-10-CM

## 2018-04-17 LAB — CULTURE, BLOOD (ROUTINE X 2)
CULTURE: NO GROWTH
Culture: NO GROWTH
SPECIAL REQUESTS: ADEQUATE
Special Requests: ADEQUATE

## 2018-04-17 LAB — CBC
HEMATOCRIT: 39.9 % — AB (ref 40.0–52.0)
Hemoglobin: 12.8 g/dL — ABNORMAL LOW (ref 13.0–18.0)
MCH: 27.2 pg (ref 26.0–34.0)
MCHC: 32 g/dL (ref 32.0–36.0)
MCV: 85.1 fL (ref 80.0–100.0)
Platelets: 188 10*3/uL (ref 150–440)
RBC: 4.69 MIL/uL (ref 4.40–5.90)
RDW: 18.2 % — AB (ref 11.5–14.5)
WBC: 13.5 10*3/uL — ABNORMAL HIGH (ref 3.8–10.6)

## 2018-04-17 LAB — HEPARIN LEVEL (UNFRACTIONATED)
HEPARIN UNFRACTIONATED: 0.29 [IU]/mL — AB (ref 0.30–0.70)
HEPARIN UNFRACTIONATED: 0.43 [IU]/mL (ref 0.30–0.70)
HEPARIN UNFRACTIONATED: 0.46 [IU]/mL (ref 0.30–0.70)

## 2018-04-17 LAB — GLUCOSE, CAPILLARY
GLUCOSE-CAPILLARY: 128 mg/dL — AB (ref 65–99)
GLUCOSE-CAPILLARY: 129 mg/dL — AB (ref 65–99)
GLUCOSE-CAPILLARY: 136 mg/dL — AB (ref 65–99)
GLUCOSE-CAPILLARY: 142 mg/dL — AB (ref 65–99)
GLUCOSE-CAPILLARY: 150 mg/dL — AB (ref 65–99)
GLUCOSE-CAPILLARY: 164 mg/dL — AB (ref 65–99)
GLUCOSE-CAPILLARY: 179 mg/dL — AB (ref 65–99)
GLUCOSE-CAPILLARY: 185 mg/dL — AB (ref 65–99)
GLUCOSE-CAPILLARY: 192 mg/dL — AB (ref 65–99)
Glucose-Capillary: 142 mg/dL — ABNORMAL HIGH (ref 65–99)
Glucose-Capillary: 146 mg/dL — ABNORMAL HIGH (ref 65–99)
Glucose-Capillary: 153 mg/dL — ABNORMAL HIGH (ref 65–99)
Glucose-Capillary: 154 mg/dL — ABNORMAL HIGH (ref 65–99)
Glucose-Capillary: 156 mg/dL — ABNORMAL HIGH (ref 65–99)
Glucose-Capillary: 159 mg/dL — ABNORMAL HIGH (ref 65–99)
Glucose-Capillary: 167 mg/dL — ABNORMAL HIGH (ref 65–99)
Glucose-Capillary: 171 mg/dL — ABNORMAL HIGH (ref 65–99)
Glucose-Capillary: 172 mg/dL — ABNORMAL HIGH (ref 65–99)
Glucose-Capillary: 175 mg/dL — ABNORMAL HIGH (ref 65–99)
Glucose-Capillary: 178 mg/dL — ABNORMAL HIGH (ref 65–99)
Glucose-Capillary: 181 mg/dL — ABNORMAL HIGH (ref 65–99)
Glucose-Capillary: 181 mg/dL — ABNORMAL HIGH (ref 65–99)
Glucose-Capillary: 184 mg/dL — ABNORMAL HIGH (ref 65–99)
Glucose-Capillary: 186 mg/dL — ABNORMAL HIGH (ref 65–99)

## 2018-04-17 LAB — BASIC METABOLIC PANEL
ANION GAP: 8 (ref 5–15)
BUN: 69 mg/dL — AB (ref 6–20)
CALCIUM: 8.6 mg/dL — AB (ref 8.9–10.3)
CO2: 30 mmol/L (ref 22–32)
Chloride: 105 mmol/L (ref 101–111)
Creatinine, Ser: 2.24 mg/dL — ABNORMAL HIGH (ref 0.61–1.24)
GFR calc Af Amer: 38 mL/min — ABNORMAL LOW (ref >60)
GFR, EST NON AFRICAN AMERICAN: 33 mL/min — AB (ref >60)
Glucose, Bld: 163 mg/dL — ABNORMAL HIGH (ref 65–99)
POTASSIUM: 3.9 mmol/L (ref 3.5–5.1)
SODIUM: 143 mmol/L (ref 135–145)

## 2018-04-17 LAB — PHOSPHORUS: PHOSPHORUS: 3.4 mg/dL (ref 2.5–4.6)

## 2018-04-17 LAB — VANCOMYCIN, RANDOM: VANCOMYCIN RM: 17

## 2018-04-17 LAB — MAGNESIUM: MAGNESIUM: 2.5 mg/dL — AB (ref 1.7–2.4)

## 2018-04-17 MED ORDER — FREE WATER
200.0000 mL | Freq: Three times a day (TID) | Status: DC
  Administered 2018-04-17 – 2018-04-25 (×20): 200 mL

## 2018-04-17 MED ORDER — VITAL HIGH PROTEIN PO LIQD
1000.0000 mL | ORAL | Status: DC
  Administered 2018-04-17 – 2018-04-24 (×7): 1000 mL

## 2018-04-17 MED ORDER — SODIUM CHLORIDE 0.9% FLUSH
10.0000 mL | INTRAVENOUS | Status: DC | PRN
  Administered 2018-05-25: 20 mL
  Filled 2018-04-17: qty 40

## 2018-04-17 MED ORDER — METOPROLOL TARTRATE 5 MG/5ML IV SOLN
2.5000 mg | INTRAVENOUS | Status: DC | PRN
  Administered 2018-04-18 – 2018-04-19 (×3): 5 mg via INTRAVENOUS
  Administered 2018-04-20: 2.5 mg via INTRAVENOUS
  Administered 2018-04-25: 5 mg via INTRAVENOUS
  Filled 2018-04-17 (×5): qty 5

## 2018-04-17 MED ORDER — PRO-STAT SUGAR FREE PO LIQD
60.0000 mL | Freq: Three times a day (TID) | ORAL | Status: DC
  Administered 2018-04-17 – 2018-04-24 (×20): 60 mL

## 2018-04-17 MED ORDER — AMIODARONE HCL 200 MG PO TABS
400.0000 mg | ORAL_TABLET | Freq: Two times a day (BID) | ORAL | Status: DC
  Administered 2018-04-17 – 2018-04-18 (×3): 400 mg
  Filled 2018-04-17 (×3): qty 2

## 2018-04-17 MED ORDER — VANCOMYCIN HCL 10 G IV SOLR
1250.0000 mg | Freq: Once | INTRAVENOUS | Status: AC
  Administered 2018-04-17: 1250 mg via INTRAVENOUS
  Filled 2018-04-17: qty 1250

## 2018-04-17 MED ORDER — SODIUM CHLORIDE 0.9% FLUSH
10.0000 mL | Freq: Two times a day (BID) | INTRAVENOUS | Status: DC
  Administered 2018-04-19 – 2018-04-23 (×6): 10 mL
  Administered 2018-04-23: 30 mL
  Administered 2018-04-24 (×2): 10 mL
  Administered 2018-04-25: 30 mL
  Administered 2018-04-25 – 2018-04-29 (×4): 10 mL
  Administered 2018-04-30: 30 mL
  Administered 2018-04-30 – 2018-05-06 (×12): 10 mL
  Administered 2018-05-06: 20 mL
  Administered 2018-05-07: 30 mL
  Administered 2018-05-07 – 2018-05-10 (×6): 10 mL
  Administered 2018-05-10: 30 mL
  Administered 2018-05-11 – 2018-05-13 (×5): 10 mL
  Administered 2018-05-13: 40 mL
  Administered 2018-05-14 – 2018-05-18 (×10): 10 mL
  Administered 2018-05-19: 40 mL
  Administered 2018-05-19 – 2018-05-20 (×2): 10 mL
  Administered 2018-05-20: 40 mL
  Administered 2018-05-21 – 2018-05-23 (×2): 10 mL
  Administered 2018-05-24: 30 mL
  Administered 2018-05-25 – 2018-05-26 (×2): 10 mL

## 2018-04-17 MED ORDER — INSULIN GLARGINE 100 UNIT/ML ~~LOC~~ SOLN
40.0000 [IU] | Freq: Two times a day (BID) | SUBCUTANEOUS | Status: DC
  Administered 2018-04-17 – 2018-04-18 (×3): 40 [IU] via SUBCUTANEOUS
  Filled 2018-04-17 (×4): qty 0.4

## 2018-04-17 MED ORDER — FAMOTIDINE 20 MG PO TABS
20.0000 mg | ORAL_TABLET | Freq: Every day | ORAL | Status: DC
  Administered 2018-04-18 – 2018-05-15 (×28): 20 mg
  Filled 2018-04-17 (×28): qty 1

## 2018-04-17 MED ORDER — FUROSEMIDE 10 MG/ML IJ SOLN
60.0000 mg | Freq: Once | INTRAMUSCULAR | Status: AC
  Administered 2018-04-17: 60 mg via INTRAVENOUS
  Filled 2018-04-17: qty 6

## 2018-04-17 MED ORDER — HEPARIN BOLUS VIA INFUSION
1800.0000 [IU] | Freq: Once | INTRAVENOUS | Status: AC
  Administered 2018-04-17: 1800 [IU] via INTRAVENOUS
  Filled 2018-04-17: qty 1800

## 2018-04-17 NOTE — Progress Notes (Signed)
eLink Physician-Brief Progress Note Patient Name: Alexander RiffleOtis Leon Alexander DOB: Jun 14, 1971 MRN: [id number]   Date of Service  04/17/2018  HPI/Events of Note  Hyoxia - CXR from today with low lung volumes and patchy airspace disease. PEEP = 10.   eICU Interventions  Will order: 1. Lasix 60 mg IV now.  2. Please have Respiratory Therapy do a recruitment maneuver.  3. ABG now.      Intervention Category Major Interventions: Hypoxemia - evaluation and management  Sommer,Steven Eugene 04/17/2018, 11:33 PM

## 2018-04-17 NOTE — Progress Notes (Signed)
[employer] -  at [facility]   PATIENT NAME: Alexander Alexander    MR#:  [id number]  DATE OF BIRTH:  May 23, 1971  SUBJECTIVE:  CHIEF COMPLAINT:   Chief Complaint  Patient presents with  . Back Pain  Patient seen and evaluated today -Patient is status post cardiac arrest.  Obese male. -Intubated and on ventilator.  Has thick respiratory secretions.  Being treated for MRSA pneumonia. -On amiodarone drip for A. fib RVR, heparin drip for A. fib and aortic valve replacement.   - sedation with propofol and fentanyl  REVIEW OF SYSTEMS:  Review of Systems  Unable to perform ROS: Critical illness  Gastrointestinal: Positive for heartburn.    DRUG ALLERGIES:   Allergies  Allergen Reactions  . Lexapro [Escitalopram Oxalate] Swelling    VITALS:  Blood pressure 130/81, pulse (!) 111, temperature 100 F (37.8 C), temperature source Bladder, resp. rate 18, height 5\' 10"  (1.778 m), weight (!) 188.2 kg (415 lb), SpO2 90 %.  PHYSICAL EXAMINATION:  Physical Exam  GENERAL:  47 y.o.-year-old morbidly obese patient lying in the bed, critically ill appearing. EYES: Pupils equal, round, reactive to light and accommodation. No scleral icterus. Extraocular muscles intact.  HEENT: Head atraumatic, normocephalic.  Orally intubated, OG tube present.  Increased secretions noted. NECK:  Supple, no jugular venous distention. No thyroid enlargement, no tenderness.  LUNGS: Coarse breath sounds bilaterally, decreased at the bases.  No wheezing, rales,rhonchi or  crepitation. No use of accessory muscles of respiration.  Ventilator setting Tidal volume : 550 Rate :16 Rate : 10 Fio2 : 55% CARDIOVASCULAR: S1, S2 normal. No  rubs, or gallops. Loud 3/6 systolic murmur present ABDOMEN: Soft, obese, nontender, nondistended. Bowel sounds present. No organomegaly or mass.  EXTREMITIES: No pedal edema, cyanosis, or clubbing.  NEUROLOGIC: Patient is sedated today and not following  commands PSYCHIATRIC: The patient is sedated today SKIN: No obvious rash, lesion, or ulcer.    LABORATORY PANEL:   CBC Recent Labs  Lab 04/17/18 1033  WBC 13.5*  HGB 12.8*  HCT 39.9*  PLT 188   ------------------------------------------------------------------------------------------------------------------  Chemistries  Recent Labs  Lab 04/12/18 0421  04/17/18 0404  NA 138   < > 143  K 4.8   < > 3.9  CL 99*   < > 105  CO2 27   < > 30  GLUCOSE 301*   < > 163*  BUN 44*   < > 69*  CREATININE 2.50*   < > 2.24*  CALCIUM 8.6*   < > 8.6*  MG 2.0   < > 2.5*  AST 83*  --   --   ALT 37  --   --   ALKPHOS 48  --   --   BILITOT 1.6*  --   --    < > = values in this interval not displayed.   ------------------------------------------------------------------------------------------------------------------  Cardiac Enzymes No results for input(s): TROPONINI in the last 168 hours. ------------------------------------------------------------------------------------------------------------------  RADIOLOGY:  Dg Chest Port 1 View  Result Date: 04/17/2018 CLINICAL DATA:  Increased acute respiratory failure, history of atrial fibrillation and diabetes EXAM: PORTABLE CHEST 1 VIEW COMPARISON:  Portable chest x-ray of 04/14/2018 FINDINGS: The tip of the endotracheal tube is approximately 5.2 cm above the carina. Opacity remains at the right lung base most consistent with atelectasis, but there is more opacity now present at the left lung base. Considerations are that of atelectasis versus pneumonia. No definite effusion is seen. Cardiomegaly is stable. IMPRESSION: 1. Tip  of endotracheal tube 5.2 cm above the carina. 2. Diminished aeration with increasing basilar opacities most consistent with atelectasis. Pneumonia cannot be excluded. 3. Stable cardiomegaly. Electronically Signed   By: Dwyane Dee M.D.   On: 04/17/2018 09:10   Dg Abd Portable 1v  Result Date: 04/16/2018 CLINICAL DATA:   Orogastric tube placement EXAM: PORTABLE ABDOMEN - 1 VIEW COMPARISON:  April 10, 2018 FINDINGS: Orogastric tube tip is at the gastroesophageal junction. There is a paucity of gas. No free air evident. IMPRESSION: Orogastric tube tip at gastroesophageal junction. Advise advancing tube approximately 10 cm to insure tip and side port in stomach. Paucity of gas is noted. While this finding may be seen normally, it may also be indicative ileus or enteritis. Bowel obstruction not felt to be likely. Visualized lung bases clear. Electronically Signed   By: Bretta Bang III M.D.   On: 04/16/2018 10:54   [place] Ekg Site Rite  Result Date: 04/17/2018 If Site Rite image not attached, placement could not be confirmed due to current cardiac rhythm.   EKG:   Orders placed or performed during the hospital encounter of 04/07/18  . EKG 12-Lead  . EKG 12-Lead  . EKG 12-Lead  . EKG 12-Lead    ASSESSMENT AND PLAN:   47 year old male with past medical history significant for A. fib status post ablation, aortic valve replacement on warfarin, history of congestive heart failure, CKD, morbid obesity, diabetes and sleep apnea who was getting an outpatient MRI of his lower back had a cardiac arrest.  1.  Cardiac arrest-from respiratory causes. -Patient has acute hypoxic respiratory failure-currently remains on ventilator.  Still requiring 50% FiO2 and 10 of PEEP -Sputum cultures positive for MRSA.  Currently on vancomycin. -Continue management per ICU team -ENT consultation for trach placement today  2.  Atrial fibrillation-tachycardic. -Remains on amiodarone drip at this time -Receiving heparin drip for anticoagulation.  Also has a history of mechanical aortic valve replacement.  3.  Altered mental status-likely metabolic encephalopathy. -No anoxic changes.  Appreciate neurology consult -Keppra was started for possible partial seizures  4.  Diabetes mellitus-currently on insulin drip while in the ICU  5.   Acute renal failure on CKD secondary to ATN.  Creatinine is stable around 2. -Continue to monitor urine output  6.  Septic shock-improved. off Neo-Synephrine drip -Sputum cultures with MRSA.  Currently on vancomycin.  Afebrile.  7.  DVT prophylaxis-on heparin drip at this time   All the records are reviewed and case discussed with Care Management/Social Workerr. Management plans discussed with the patient, family and they are in agreement.  CODE STATUS: Full code  TOTAL TIME TAKING CARE OF THIS PATIENT: 34 minutes.   POSSIBLE D/C IN 5 to 7 DAYS, DEPENDING ON CLINICAL CONDITION.   Ihor Austin M.D on 04/17/2018 at 12:12 PM  Between 7am to 6pm - Pager - [phone]  After 6pm go to [email or web address] - Social research officer, government  [facility] [place] [facility]  Office  [phone]  CC: Primary care physician; Erasmo Downer, MD

## 2018-04-17 NOTE — Progress Notes (Signed)
[place] for heparin drip management Indication: atrial fibrillation and aortic valve replacement   Allergies  Allergen Reactions  . Lexapro [Escitalopram Oxalate] Swelling    Patient Measurements: Height: 5' 10"  (177.8 cm)(based on previous notes) Weight: (!) 415 lb (188.2 kg) IBW/kg (Calculated) : 73 Heparin Dosing Weight: 111kg  Vital Signs: Temp: 99.3 F (37.4 C) (06/10 1200) Temp Source: Bladder (06/10 1200) BP: 126/82 (06/10 1200) Pulse Rate: 108 (06/10 1200)  Labs: Recent Labs    04/15/18 0421 04/16/18 0430 04/17/18 0404 04/17/18 1033  HGB 13.6 13.2  --  12.8*  HCT 42.5 40.7  --  39.9*  PLT 183 185  --  188  HEPARINUNFRC 0.56 0.31 0.29* 0.43  CREATININE 2.38* 2.26* 2.24*  --     Estimated Creatinine Clearance: 67.9 mL/min (A) (by C-G formula based on SCr of 2.24 mg/dL (H)).   Medical History: Past Medical History:  Diagnosis Date  . Allergy   . Anxiety   . Arrhythmia   . Arthritis   . Atrial fibrillation ([place])   . CHF (congestive heart failure) ([place])   . CKD (chronic kidney disease)   . Diabetes mellitus without complication ([place])   . Diabetes mellitus, type II ([place])   . Heart failure ([place])   . Hyperlipidemia   . Hypertension   . Pancreatitis   . Sleep apnea     Medications:  Scheduled:  . amiodarone  400 mg Per Tube BID  . chlorhexidine gluconate (MEDLINE KIT)  15 mL Mouth Rinse BID  . [START ON 04/18/2018] famotidine  20 mg Per Tube Daily  . feeding supplement (PRO-STAT SUGAR FREE 64)  60 mL Per Tube TID  . free water  200 mL Per Tube Q8H  . insulin glargine  40 Units Subcutaneous BID  . mouth rinse  15 mL Mouth Rinse 10 times per day  . multivitamin  15 mL Per Tube Daily  . sodium chloride flush  10-40 mL Intracatheter Q12H   Infusions:  . feeding supplement (VITAL HIGH PROTEIN) 1,000 mL (04/17/18 1250)  . fentaNYL infusion INTRAVENOUS 225 mcg/hr (04/17/18 1200)  . heparin 2,300 Units/hr  (04/17/18 1200)  . insulin (NOVOLIN-R) infusion 13.4 Units/hr (04/17/18 1300)  . propofol (DIPRIVAN) infusion 10 mcg/kg/min (04/17/18 1322)    Assessment: Pharmacy consulted for warfarin and heparin drip management for 47 yo male admitted to the ICU s/p cardiac arrest. Patient has history significant for aortic valve replacement, atrial fibrillation and CHF. Head CT is negative for infarction or hemorrhage. Heparin level for today continues to be subtherapeutic at 0.28, with a goal of 0.3-0.7 units/mL. Confirmed with RN that heparin drip has not been interrupted. Per protocol, will re-bolus heparin and increase rate of heparin drip. Will obtain heparin level at 2030 and continue to monitor and adjust as needed.  Patient takes warfarin 35m daily as an outpatient for goal INR 2.5-3.5.    Goal of Therapy:  INR 2-3 Heparin level 0.3-0.7 units/ml Monitor platelets by anticoagulation protocol: Yes   Plan:  06/10 @ 0400 HL 0.29 subtherapeutic. Will rebolus w/ 1800 units IV x 1 and will increase rate to 2300 units/hr and will recheck @ 1100. Hgb trending down but stable will continue to monitor.  6/10 @1100  HL 0.43- Level is therapeutic x 1. Will order confirmatory level in 6 hours.   SPernell Dupre PharmD, BCPS Clinical Pharmacist 04/17/2018 1:28 PM

## 2018-04-17 NOTE — Progress Notes (Signed)
[place] for heparin drip management Indication: atrial fibrillation and aortic valve replacement   Allergies  Allergen Reactions  . Lexapro [Escitalopram Oxalate] Swelling    Patient Measurements: Height: _0  (177.8 cm)(based on previous notes) Weight: (!) 415 lb (188.2 kg) IBW/kg (Calculated) : 73 Heparin Dosing Weight: 111kg  Vital Signs: Temp: 98.7 F (37.1 C) (06/10 1600) Temp Source: Bladder (06/10 1600) BP: 124/85 (06/10 1800) Pulse Rate: 115 (06/10 1800)  Labs: Recent Labs    04/15/18 0421 04/16/18 0430 04/17/18 0404 04/17/18 1033 04/17/18 1625  HGB 13.6 13.2  --  12.8*  --   HCT 42.5 40.7  --  39.9*  --   PLT 183 185  --  188  --   HEPARINUNFRC 0.56 0.31 0.29* 0.43 0.46  CREATININE 2.38* 2.26* 2.24*  --   --     Estimated Creatinine Clearance: 67.9 mL/min (A) (by C-G formula based on SCr of 2.24 mg/dL (H)).   Medical History: Past Medical History:  Diagnosis Date  . Allergy   . Anxiety   . Arrhythmia   . Arthritis   . Atrial fibrillation ([place])   . CHF (congestive heart failure) ([place])   . CKD (chronic kidney disease)   . Diabetes mellitus without complication ([place])   . Diabetes mellitus, type II ([place])   . Heart failure ([place])   . Hyperlipidemia   . Hypertension   . Pancreatitis   . Sleep apnea     Medications:  Scheduled:  . amiodarone  400 mg Per Tube BID  . chlorhexidine gluconate (MEDLINE KIT)  15 mL Mouth Rinse BID  . [START ON 04/18/2018] famotidine  20 mg Per Tube Daily  . feeding supplement (PRO-STAT SUGAR FREE 64)  60 mL Per Tube TID  . free water  200 mL Per Tube Q8H  . insulin glargine  40 Units Subcutaneous BID  . mouth rinse  15 mL Mouth Rinse 10 times per day  . multivitamin  15 mL Per Tube Daily  . sodium chloride flush  10-40 mL Intracatheter Q12H  . sodium chloride flush  10-40 mL Intracatheter Q12H   Infusions:  . feeding supplement (VITAL HIGH PROTEIN) 50 mL/hr at 04/17/18 1600   . fentaNYL infusion INTRAVENOUS 225 mcg/hr (04/17/18 1600)  . heparin 2,300 Units/hr (04/17/18 1608)  . insulin (NOVOLIN-R) infusion 11.5 Units/hr (04/17/18 1804)  . propofol (DIPRIVAN) infusion 9.981 mcg/kg/min (04/17/18 1600)    Assessment: Pharmacy consulted for warfarin and heparin drip management for 47 yo male admitted to the ICU s/p cardiac arrest. Patient has history significant for aortic valve replacement, atrial fibrillation and CHF. Head CT is negative for infarction or hemorrhage. Heparin level for today continues to be subtherapeutic at 0.28, with a goal of 0.3-0.7 units/mL. Confirmed with RN that heparin drip has not been interrupted. Per protocol, will re-bolus heparin and increase rate of heparin drip. Will obtain heparin level at 2030 and continue to monitor and adjust as needed.  Patient takes warfarin 69m daily as an outpatient for goal INR 2.5-3.5.    Goal of Therapy:  INR 2-3 Heparin level 0.3-0.7 units/ml Monitor platelets by anticoagulation protocol: Yes   Plan:  06/10 @ 0400 HL 0.29 subtherapeutic. Will rebolus w/ 1800 units IV x 1 and will increase rate to 2300 units/hr and will recheck @ 1100. Hgb trending down but stable will continue to monitor.  6/10 _1  HL 0.43- Level is therapeutic x 1. Will order confirmatory level in 6 hours.  04/17/18 16:25 HL therapeutic x 1. Continue current rate. Will monitor HL/CBC daily per protocol.   Laural Benes, PharmD, BCPS Clinical Pharmacist 04/17/2018 6:29 PM

## 2018-04-17 NOTE — Progress Notes (Signed)
[facility] for heparin drip management Indication: atrial fibrillation and aortic valve replacement   Allergies  Allergen Reactions  . Lexapro [Escitalopram Oxalate] Swelling    Patient Measurements: Height: 5' 10"  (177.8 cm)(based on previous notes) Weight: (!) 415 lb (188.2 kg) IBW/kg (Calculated) : 73 Heparin Dosing Weight: 111kg  Vital Signs: Temp: 100.4 F (38 C) (06/10 0400) Temp Source: Oral (06/10 0400) BP: 135/79 (06/10 0400) Pulse Rate: 112 (06/10 0400)  Labs: Recent Labs    04/14/18 0501  04/15/18 0421 04/16/18 0430 04/17/18 0404  HGB 14.3  --  13.6 13.2  --   HCT 43.6  --  42.5 40.7  --   PLT 168  --  183 185  --   LABPROT 15.4*  --   --   --   --   INR 1.23  --   --   --   --   HEPARINUNFRC 0.26*   < > 0.56 0.31 0.29*  CREATININE 1.89*  --  2.38* 2.26*  --    < > = values in this interval not displayed.    Estimated Creatinine Clearance: 67.3 mL/min (A) (by C-G formula based on SCr of 2.26 mg/dL (H)).   Medical History: Past Medical History:  Diagnosis Date  . Allergy   . Anxiety   . Arrhythmia   . Arthritis   . Atrial fibrillation ([place])   . CHF (congestive heart failure) ([place])   . CKD (chronic kidney disease)   . Diabetes mellitus without complication ([place])   . Diabetes mellitus, type II ([place])   . Heart failure ([place])   . Hyperlipidemia   . Hypertension   . Pancreatitis   . Sleep apnea     Medications:  Scheduled:  . chlorhexidine gluconate (MEDLINE KIT)  15 mL Mouth Rinse BID  . famotidine  20 mg Per Tube BID  . feeding supplement (GLUCERNA 1.5 CAL)  1,000 mL Per Tube Q24H  . feeding supplement (PRO-STAT SUGAR FREE 64)  60 mL Per Tube 5 X Daily  . furosemide  20 mg Intravenous Daily  . mouth rinse  15 mL Mouth Rinse 10 times per day  . multivitamin  15 mL Per Tube Daily  . sodium chloride flush  10-40 mL Intracatheter Q12H   Infusions:  . sodium chloride    . amiodarone 60 mg/hr (04/17/18  0400)  . fentaNYL infusion INTRAVENOUS 250 mcg/hr (04/17/18 0400)  . heparin 2,300 Units/hr (04/17/18 0448)  . insulin (NOVOLIN-R) infusion 8.5 mL/hr at 04/17/18 0400  . levETIRAcetam Stopped (04/16/18 2225)  . phenylephrine (NEO-SYNEPHRINE) Adult infusion Stopped (04/15/18 1400)  . propofol (DIPRIVAN) infusion 10 mcg/kg/min (04/17/18 0447)    Assessment: Pharmacy consulted for warfarin and heparin drip management for 47 yo male admitted to the ICU s/p cardiac arrest. Patient has history significant for aortic valve replacement, atrial fibrillation and CHF. Head CT is negative for infarction or hemorrhage. Heparin level for today continues to be subtherapeutic at 0.28, with a goal of 0.3-0.7 units/mL. Confirmed with RN that heparin drip has not been interrupted. Per protocol, will re-bolus heparin and increase rate of heparin drip. Will obtain heparin level at 2030 and continue to monitor and adjust as needed.  Patient takes warfarin 51m daily as an outpatient for goal INR 2.5-3.5.    Goal of Therapy:  INR 2-3 Heparin level 0.3-0.7 units/ml Monitor platelets by anticoagulation protocol: Yes   Plan:  06/10 @ 0400 HL 0.29 subtherapeutic. Will rebolus w/ 1800  units IV x 1 and will increase rate to 2300 units/hr and will recheck @ 1100. Hgb trending down but stable will continue to monitor.  Tobie Lords, PharmD, BCPS Clinical Pharmacist 04/17/2018

## 2018-04-17 NOTE — Progress Notes (Signed)
PULMONARY / CRITICAL CARE MEDICINE   Name: Alexander Alexander MRN: [id number] DOB: 08-25-1971    ADMISSION DATE:  04/07/2018  PT PROFILE:   Alexander Alexander is an 47 y.o. male. 47 year old gentleman with past medical history of atrial fibrillation, status post ablation and aortic valve replacement on warfarin, history of congestive heart failure, chronic renal disease, morbid obesity, diabetes, hyperlipidemia, obstructive sleep apnea and hypertension, suffered a pulseless V. Tach cardiac arrest in MRI when he was being evaluated for back pain and sciatica, subsequently intubated in the intensive care unit.   INDWELLING DEVICES:: ETT 05/31 >>  R IJ CVL 05/31 >>    MICRO DATA: MRSA PCR 05/31 >> NEG Urine 06/01 >> NEG Resp 06/01 >> MRSA Blood 06/01 >> NEG C diff 06/04 >> NEG  Urine 06/05 >> NEG Resp 06/05 >> MRSA Blood 06/05 >> NEG  ANTIMICROBIALS:  Anti-infectives (From admission, onward)   Start     Dose/Rate Route Frequency Ordered Stop   04/17/18 0630  vancomycin (VANCOCIN) 1,250 mg in sodium chloride 0.9 % 250 mL IVPB     1,250 mg 166.7 mL/hr over 90 Minutes Intravenous  Once 04/17/18 0618 04/17/18 0855   04/14/18 1430  vancomycin (VANCOCIN) IVPB 1000 mg/200 mL premix  Status:  Discontinued     1,000 mg 200 mL/hr over 60 Minutes Intravenous Every 12 hours 04/14/18 1348 04/16/18 1450   04/11/18 2000  vancomycin (VANCOCIN) 1,250 mg in sodium chloride 0.9 % 250 mL IVPB  Status:  Discontinued     1,250 mg 166.7 mL/hr over 90 Minutes Intravenous Every 18 hours 04/11/18 1548 04/14/18 1348   04/10/18 1400  vancomycin (VANCOCIN) 1,250 mg in sodium chloride 0.9 % 250 mL IVPB  Status:  Discontinued     1,250 mg 166.7 mL/hr over 90 Minutes Intravenous Every 12 hours 04/10/18 1149 04/11/18 1548   04/10/18 1200  Ampicillin-Sulbactam (UNASYN) 3 g in sodium chloride 0.9 % 100 mL IVPB  Status:  Discontinued     3 g 200 mL/hr over 30 Minutes Intravenous Every 6 hours 04/10/18 0851 04/10/18  1131   04/09/18 1800  vancomycin (VANCOCIN) 1,500 mg in sodium chloride 0.9 % 500 mL IVPB  Status:  Discontinued     1,500 mg 250 mL/hr over 120 Minutes Intravenous Every 18 hours 04/09/18 1303 04/10/18 0750   04/09/18 1045  vancomycin (VANCOCIN) IVPB 1000 mg/200 mL premix     1,000 mg 200 mL/hr over 60 Minutes Intravenous  Once 04/09/18 1041 04/09/18 1307   04/07/18 1400  piperacillin-tazobactam (ZOSYN) IVPB 3.375 g  Status:  Discontinued     3.375 g 12.5 mL/hr over 240 Minutes Intravenous Every 8 hours 04/07/18 1015 04/10/18 0851      Pip-tazo 05/31 >> 06/03 Vanc 05/31 >>    SUBJECTIVE:  RASS -3, awakens and + F/C. No SBT performed  VITAL SIGNS: BP 126/82 (BP Location: Left Wrist)   Pulse (!) 108   Temp 99.3 F (37.4 C) (Bladder)   Resp 19   Ht 5\' 10"  (1.778 m) Comment: based on previous notes  Wt (!) 415 lb (188.2 kg)   SpO2 91%   BMI 59.55 kg/m   HEMODYNAMICS:    VENTILATOR SETTINGS: Vent Mode: PRVC FiO2 (%):  [55 %] 55 % Set Rate:  [16 bmp-20 bmp] 16 bmp Vt Set:  [550 mL] 550 mL PEEP:  [5 cmH20-10 cmH20] 5 cmH20 Plateau Pressure:  [23 cmH20-29 cmH20] 29 cmH20  INTAKE / OUTPUT: I/O last 3 completed shifts: In:  4823 [I.V.:3773; NG/GT:850; IV Piggyback:200] Out: 5155 [Urine:4255; Stool:900]  PHYSICAL EXAMINATION: General: Intubated, sedated, + F/C Neuro: CNs intact, moves all extremities HEENT: NCAT, sclerae white Cardiovascular: IRIR, no M Lungs: no wheezes Abdomen: distended, soft, diminished BS Ext: symmetric LE edema Skin: No lesions noted  LABS:  BMET Recent Labs  Lab 04/15/18 0421 04/16/18 0430 04/16/18 1140 04/17/18 0404  NA 139 140  --  143  K 4.5 3.8 3.7 3.9  CL 102 103  --  105  CO2 28 27  --  30  BUN 68* 70*  --  69*  CREATININE 2.38* 2.26*  --  2.24*  GLUCOSE 352* 148*  --  163*    Electrolytes Recent Labs  Lab 04/11/18 0526 04/12/18 0421  04/15/18 0421 04/16/18 0430 04/16/18 1140 04/17/18 0404  CALCIUM 8.3* 8.6*   < >  8.4* 8.6*  --  8.6*  MG 2.2 2.0  --   --   --  2.5* 2.5*  PHOS 4.0 4.2  --   --   --   --  3.4   < > = values in this interval not displayed.    CBC Recent Labs  Lab 04/15/18 0421 04/16/18 0430 04/17/18 1033  WBC 14.6* 14.1* 13.5*  HGB 13.6 13.2 12.8*  HCT 42.5 40.7 39.9*  PLT 183 185 188    Coag's Recent Labs  Lab 04/12/18 0421 04/13/18 0424 04/14/18 0501  INR 1.10 1.09 1.23    Sepsis Markers No results for input(s): LATICACIDVEN, PROCALCITON, O2SATVEN in the last 168 hours.  ABG No results for input(s): PHART, PCO2ART, PO2ART in the last 168 hours.  Liver Enzymes Recent Labs  Lab 04/12/18 0421  AST 83*  ALT 37  ALKPHOS 48  BILITOT 1.6*  ALBUMIN 2.9*    Cardiac Enzymes No results for input(s): TROPONINI, PROBNP in the last 168 hours.  Glucose Recent Labs  Lab 04/17/18 0708 04/17/18 0801 04/17/18 0908 04/17/18 1001 04/17/18 1059 04/17/18 1156  GLUCAP 175* 153* 164* 171* 167* 184*    CXR: Platelike atelectasis in RML, cardiomegaly   ASSESSMENT / PLAN:  PULMONARY A: Respiratory arrest 05/31 Prolonged ventilator dependence Persistently high FiO2/PEEP requirements P:   Cont vent support - settings reviewed and/or adjusted Cont vent bundle Daily SBT if/when meets criteria ENT consultation requested 6/10 for tracheostomy tube placement  CARDIOVASCULAR A:  VT cardiac arrest 5/31 (felt secondary to respiratory rest) Chronic atrial fibrillation P:  MAP goal > 65 mmHg Continue heparin infusion  RENAL A:   CKD, baseline creatinine 2.0-2.5 P:   Monitor BMET intermittently Monitor I/Os Correct electrolytes as indicated   GASTROINTESTINAL A:   Abdominal distention P:   SUP: Enteral famotidine Continue TF protocol  HEMATOLOGIC A:   Very mild ICU acquired anemia P:  DVT px: heparin infusion (A. fib) Monitor CBC intermittently Transfuse per usual guidelines   INFECTIOUS A:   MRSA pneumonia P:   Monitor temp, WBC count Micro  and abx as above  Complete 10 days ABX  ENDOCRINE A:   DM 2 Insulin resistance P:   Continue insulin infusion Initiate Lantus 6/10 with goal of transitioning to SSI  NEUROLOGIC A:   Acute encephalopathy, improved P:   RASS goal: 1, -2 Continue PAD protocol   FAMILY  Wife updated at bedside   CCM time: 40 mins The above time includes time spent in consultation with patient and/or family members and reviewing care plan on multidisciplinary rounds  Billy Fischeravid Simonds, MD PCCM service Mobile [phone] Pager  [phone]    04/17/2018, 12:56 PM

## 2018-04-17 NOTE — Progress Notes (Signed)
Peripherally Inserted Central Catheter/Midline Placement  The IV Nurse has discussed with the patient and/or persons authorized to consent for the patient, the purpose of this procedure and the potential benefits and risks involved with this procedure.  The benefits include less needle sticks, lab draws from the catheter, and the patient may be discharged home with the catheter. Risks include, but not limited to, infection, bleeding, blood clot (thrombus formation), and puncture of an artery; nerve damage and irregular heartbeat and possibility to perform a PICC exchange if needed/ordered by physician.  Alternatives to this procedure were also discussed.  B[facility] Power PICC patient education guide, fact sheet on infection prevention and patient information card has been provided to patient /or left at bedside.    PICC/Midline Placement Documentation        Alexander Alexander, Alexander Alexander 04/17/2018, 3:09 PM

## 2018-04-17 NOTE — Progress Notes (Addendum)
Nutrition Follow-up  DOCUMENTATION CODES:   Morbid obesity  INTERVENTION:  Initiate new goal TF regimen of Vital High Protein at 50 mL/hr (1200 mL goal daily volume) + Pro-Stat 60 mL TID via OGT. Provides 1800 kcal, 195 grams of protein, 134 grams of carbohydrate, 1008 mL H2O daily. With current propofol rate provides 2080 kcal daily.   When tube feeds run at goal rate patient will receive 5.6 grams of carbohydrate per hour.  Continue liquid MVI daily per tube as goal tube feed rate does not meet 100% RDIs for vitamins/minerals.  With free water flush of 200 mL Q8hrs patient is receiving a total of 1608 mL H2O daily including water in tube feeding.  NUTRITION DIAGNOSIS:   Inadequate oral intake related to inability to eat as evidenced by NPO status.  Ongoing - addressing with TF regimen.  GOAL:   Provide needs based on ASPEN/SCCM guidelines  Met with TF regimen.  MONITOR:   Vent status, Labs, Weight trends, Skin, TF tolerance, I & O's  REASON FOR ASSESSMENT:   Ventilator, Consult Enteral/tube feeding initiation and management  ASSESSMENT:   47 year old male with PMHx of A-fib s/p ablation and mechanical aortic valve replacement on warfarin, HTN, DM type 2, CHF, OSA, anxiety, CKD, HLD, pancreatitis who presented with lower back pain and sciatica. While in MRI on 5/31 suite patient suffered pulseless V. tach cardiac arrest with 5 minutes ACLS before ROSC obtained, and was intubated.   -Possible d/c plan for LTAC next week.  Patient remains intubated and sedated. Per discussion on rounds patient is awake and following commands. Plan is for ENT consult for tracheostomy tube placement. Abdomen feels soft today. Currently in [facility] mode with FiO2 55%.  Enteral Access: 18 Fr. OGT placed 5/31; per abdominal x-ray on 6/9 tube tip is at the GE junction and there was recommendation to advance tube approximately 10 cm; 65 cm at corner of mouth now (was at 48 cm yesterday prior to  advancement per flowsheet)  MAP: 81-111 mmHg  TF: pt tolerating Glucerna 1.5 Cal at 20 mL/hr + Pro-Stat 60 mL 5 times daily (only 64 grams of carbohydrates daily); per pump history he received 449 mL tube feeds in the past 24 hrs (93.5% goal daily volume); this AM pt was started on FWF of 200 mL Q8hrs  Patient is currently intubated on ventilator support Ve: 11.2 L/min Temp (24hrs), Avg:99.9 F (37.7 C), Min:98.9 F (37.2 C), Max:100.4 F (38 C)  Propofol: 10.6 mL/hr (280 kcal daily)  Medications reviewed and include: amiodarone, famotidine starting tomorrow, liquid MVI daily per tube, heparin gtt, fentanyl gtt, regular insulin gtt (5.8-17.7 units/hr past 24 hrs; currently at 10.3 units/hr), propofol gtt.  Labs reviewed: CBG 128-192 past 24 hrs, BUN 69 (decreased from yesterday), Creatinine 2.24 (decreased from yesterday), Magnesium 2.5. Potassium and Phosphorus WNL today.  I/O: 3070 mL UOP yesterday (0.7 mL/kg/hr); 500 mL output in rectal tube yesterday  Weight trend: 188.2 kg on 6/10; +20.4 kg from admission  Discussed with RN and on rounds.  Diet Order:   Diet Order    None      EDUCATION NEEDS:   No education needs have been identified at this time  Skin:  Skin Assessment: Skin Integrity Issues:(serous blister to right arm)  Last BM:  04/17/2018 - small type 7; rectal tube in place  Height:   Ht Readings from Last 1 Encounters:  04/07/18 5' 10"  (1.778 m)    Weight:   Wt Readings from  Last 1 Encounters:  04/17/18 (!) 415 lb (188.2 kg)    Ideal Body Weight:  75.5 kg  BMI:  Body mass index is 59.55 kg/m.  Estimated Nutritional Needs:   Kcal:  5107-1252 (22-25 kcal/kg IBW)  Protein:  189 grams (2.5 grams/kg IBW)  Fluid:  1.8 L/day (25 mL/kg IBW)  Willey Blade, MS, RD, LDN Office: [phone] Pager: [phone] After Hours/Weekend Pager: [phone]

## 2018-04-17 NOTE — Care Management (Signed)
RNCM spoke with patient's wife regarding LTAC with [facility]. She would like to start authorization with [facility]. She is concerned about "out of network charges".  Irving Burtonmily with [facility] has been updated. ENT consult pending for trach evalution.

## 2018-04-17 NOTE — Consult Note (Signed)
Alexander Alexander, Alexander Alexander [id number] 29-Mar-1971   Reason for Consult: Evaluate for trach tube Requesting Physician:  Saundra Shelling, MD   HPI: The patient is a 47 year old  African-American male with a past medical history of atrial fibrillation, status post ablation and aortic valve replacement on warfarin, history of congestive heart failure, chronic renal disease, morbid obesity, diabetes, hyperlipidemia, obstructive sleep apnea and hypertension, suffered a pulseless V. tach cardiac arrest in MRI while he was being evaluated for back pain and sciatica, subsequently intubated in the intensive care unit.  This occurred on 04/07/2018.  He has been vent dependent since that time with significant oxygen needs.  He is being sedated and has not been a candidate for decannulation.  It is now 10 days post intubation and assessments made for possibility of tracheostomy.  ROS:  Negative except as in HPI.  Past Medical History:  Diagnosis Date  . Allergy   . Anxiety   . Arrhythmia   . Arthritis   . Atrial fibrillation ([place])   . CHF (congestive heart failure) ([place])   . CKD (chronic kidney disease)   . Diabetes mellitus without complication ([place])   . Diabetes mellitus, type II ([place])   . Heart failure ([place])   . Hyperlipidemia   . Hypertension   . Pancreatitis   . Sleep apnea     Allergies  Allergen Reactions  . Lexapro [Escitalopram Oxalate] Swelling    Scheduled Medications: . amiodarone  400 mg Per Tube BID  . chlorhexidine gluconate (MEDLINE KIT)  15 mL Mouth Rinse BID  . [START ON 04/18/2018] famotidine  20 mg Per Tube Daily  . feeding supplement (PRO-STAT SUGAR FREE 64)  60 mL Per Tube TID  . free water  200 mL Per Tube Q8H  . insulin glargine  40 Units Subcutaneous BID  . mouth rinse  15 mL Mouth Rinse 10 times per day  . multivitamin  15 mL Per Tube Daily  . sodium chloride flush  10-40 mL Intracatheter Q12H  . sodium chloride flush  10-40 mL Intracatheter Q12H    PRN Meds: acetaminophen,  fentaNYL, metoprolol tartrate, ondansetron (ZOFRAN) IV, sodium chloride flush, sodium chloride flush  Infusions: . feeding supplement (VITAL HIGH PROTEIN) 50 mL/hr at 04/17/18 1600  . fentaNYL infusion INTRAVENOUS 225 mcg/hr (04/17/18 1600)  . heparin 2,300 Units/hr (04/17/18 1608)  . insulin (NOVOLIN-R) infusion 11.5 Units/hr (04/17/18 1804)  . propofol (DIPRIVAN) infusion 9.981 mcg/kg/min (04/17/18 1600)     Physical Exam:  Vitals:   04/17/18 1527 04/17/18 1600  BP:  126/84  Pulse:  (!) 118  Resp:  (!) 32  Temp:  98.7 F (37.1 C)  SpO2: 90% (!) 89%    He is a very large male that has significant obstructive sleep apnea.  He has a short thick neck but you can feel his larynx.  There is no thyromegaly.  He is orally intubated and has some oral pharyngeal feeding tube and right now as well.  He is sedated but will arouse slightly to noxious stimuli.  He is not showing any edema of his neck structures.  IMPRESSION: Prolonged intubation, obstructive sleep apnea, failure at weaning and vent dependent.  PLAN:   He needs a tracheostomy tube placed.  I know we can schedule him for Monday of next week but would like to try to arrange for this sooner.  I will discuss this with my partners to see if any of them can add the tracheostomy on for later this week.  If  there is not time in the operating room to facilitate this then we will add it for first thing Monday morning.  Once the surgery is scheduled will need to decrease his anticoagulation prior to surgery and hold his tube feedings the 8 hours prior to surgery as well.  I discussed this with his wife at the bedside who is agreement and wishes to proceed as soon as we possibly can get arranged.    Alexander Alexander 04/17/2018, 6:04 PM

## 2018-04-17 NOTE — Progress Notes (Addendum)
Pharmacy consulted for electrolyte replacement protocol:   Patients is receiving lasix 20mg  daily and is on insulin drip  Goal of therapy: Electrolytes within normal limits:  K 3.5 - 5.1 Corrected Ca 8.9 - 10.3 Phos 2.5 - 4.6 Mg 1.7 - 2.4   Assessment: Lab Results  Component Value Date   CREATININE 2.24 (H) 04/17/2018   BUN 69 (H) 04/17/2018   NA 143 04/17/2018   K 3.9 04/17/2018   CL 105 04/17/2018   CO2 30 04/17/2018    Plan: Magnesium 2.5, all other Electrolytes WNL. No additional supplementation needed, will reorder labs with AM labs tomorrow.   Gardner CandleSheema M Janaiyah Blackard, PharmD, BCPS Clinical Pharmacist 04/17/2018 7:48 AM

## 2018-04-17 NOTE — Progress Notes (Signed)
Pharmacy Antibiotic Note  Alexander Alexander is a 47 y.o. male admitted on 04/07/2018 s/p cardiac arrest started on antibiotics for MRSA Pneumonia Pharmacy has been consulted for Vancomycin dosing. Patient with fevers overnight up to 102.2F. Patient sputum cultures continue to show few staph aureus, otherwise no growth in blood or urine cultures. Vancomycin trough is 16 on 6/7 after 4 doses of 1250mg  IV q18h. Serum creatinine levels are improving and trending down, currently at 1.89. Will adjust vancomycin dose to goal trough 15-20 and continue to monitor.  Plan: Vancomycin 1000 IV q12h for goal trough of 15-20. Patient is morbidly obese and with AKI will monitor serum creatinine and will obtain follow up vancomycin level 6/9 at 1400 before fourth dose.  06/10 @ 0500 VR 17. Will give a vanc 1.25g IV x 1 and recheck VR w/ am labs. If renal function improves will place patient on a regimen.   Height: 5\' 10"  (177.8 cm)(based on previous notes) Weight: (!) 415 lb (188.2 kg) IBW/kg (Calculated) : 73  Temp (24hrs), Avg:99.9 F (37.7 C), Min:98.9 F (37.2 C), Max:100.4 F (38 C)  Recent Labs  Lab 04/11/18 1356  04/13/18 0424 04/14/18 0103 04/14/18 0501 04/15/18 0421 04/16/18 0430 04/16/18 1415 04/17/18 0404  WBC  --   --   --   --  15.5* 14.6* 14.1*  --   --   CREATININE  --    < > 2.14*  --  1.89* 2.38* 2.26*  --  2.24*  VANCOTROUGH  --   --   --  16  --   --   --  32*  --   VANCORANDOM 23  --   --   --   --   --   --   --  17   < > = values in this interval not displayed.    Estimated Creatinine Clearance: 67.9 mL/min (A) (by C-G formula based on SCr of 2.24 mg/dL (H)).    Allergies  Allergen Reactions  . Lexapro [Escitalopram Oxalate] Swelling    Antimicrobials this admission: Zosyn 5/31 >> 6/3 Vancomycin 6/2 >> Unasyn 6/3 x 1.   Dose adjustments this admission: 6/3 Vancomycin transitioned to 1250mg  IV Q12hr.  6/4 Vancomycin transitioned to 1250mg  IV Q18hr.  6/7 Vancomycin  transitioned to 1000mg  IV Q12hr.  Microbiology results: 5/31 MRSA PCR: negative 6/1 BCx: no growth 6/1 UCx: no growth  6/1 Resp Cx/Trach Aspirate: MRSA  6/5 BCx: no growth x2 days 6/5 UCx: no growth   6/5 SputumCx: few staph aureus 6/5 CDiff: negative  Thank you for allowing pharmacy to be a part of this patient's care.  Thomasene Rippleavid Rachelanne Whidby, PharmD, BCPS Clinical Pharmacist 04/17/2018

## 2018-04-17 NOTE — Progress Notes (Addendum)
Inpatient Diabetes Program Recommendations  AACE/ADA: New Consensus Statement on Inpatient Glycemic Control (2015)  Target Ranges:  Prepandial:   less than 140 mg/dL      Peak postprandial:   less than 180 mg/dL (1-2 hours)      Critically ill patients:  140 - 180 mg/dL   Results for Alexander Alexander, Alexander Alexander (MRN [id number]) as of 04/17/2018 08:04  Ref. Range 04/17/2018 00:46 04/17/2018 02:03 04/17/2018 03:00 04/17/2018 03:54 04/17/2018 04:59 04/17/2018 05:54 04/17/2018 07:08 04/17/2018 08:01  Glucose-Capillary Latest Ref Range: 65 - 99 mg/dL 409142 (H) 811136 (H) 914128 (H) 159 (H) 181 (H) 179 (H) 175 (H) 153 (H)    HomeDM Meds: Levemir 65unitsBID  Novolog 40 unitsTID  Amaryl 2 mg daily  CurrentOrders:IV Insulin Drip     Remains Intubated and is currently receiving Glucerna Tube Feedings at 20cc/hr.  CBGs are stable at present, however, Insulin Drip rates are >4 units/hr and average Insulin drip rate since Midnight is 10 units/hour.   12am- 12.9 units/hr 1am- 11 units/hr 2am- 8.1 units/hr 3am- 5.8 units/hr 4am- 8.5 units/hr 5am- 11.6 units/hr 6am- 11.4 units/hr 7am- 11 units/hr  Per the ICU Glycemic Control Protocol Transition guidelines, it is not recommended to transition off the IV Insulin drip until Insulin Drip rates are 4 units/hour or less.  May want to leave on the IV Insulin drip until patient Extubated or until Insulin Drip rates are closer to 4 units/hour or less.     --Will follow patient during hospitalization--  Ambrose FinlandJeannine Johnston Darbie Biancardi RN, MSN, CDE Diabetes Coordinator Inpatient Glycemic Control Team Team Pager: [phone] (8a-5p)

## 2018-04-18 ENCOUNTER — Inpatient Hospital Stay: Payer: Medicare HMO

## 2018-04-18 ENCOUNTER — Other Ambulatory Visit: Payer: Self-pay

## 2018-04-18 DIAGNOSIS — I499 Cardiac arrhythmia, unspecified: Secondary | ICD-10-CM

## 2018-04-18 DIAGNOSIS — N189 Chronic kidney disease, unspecified: Secondary | ICD-10-CM

## 2018-04-18 DIAGNOSIS — L8990 Pressure ulcer of unspecified site, unspecified stage: Secondary | ICD-10-CM

## 2018-04-18 LAB — BLOOD GAS, ARTERIAL
ACID-BASE EXCESS: 5.9 mmol/L — AB (ref 0.0–2.0)
Acid-Base Excess: 4.6 mmol/L — ABNORMAL HIGH (ref 0.0–2.0)
Acid-Base Excess: 5.7 mmol/L — ABNORMAL HIGH (ref 0.0–2.0)
Acid-Base Excess: 8.2 mmol/L — ABNORMAL HIGH (ref 0.0–2.0)
Acid-base deficit: 4.7 mmol/L — ABNORMAL HIGH (ref 0.0–2.0)
BICARBONATE: 34.7 mmol/L — AB (ref 20.0–28.0)
BICARBONATE: 35.5 mmol/L — AB (ref 20.0–28.0)
BICARBONATE: 24.4 mmol/L (ref 20.0–28.0)
Bicarbonate: 32.5 mmol/L — ABNORMAL HIGH (ref 20.0–28.0)
Bicarbonate: 34.2 mmol/L — ABNORMAL HIGH (ref 20.0–28.0)
FIO2: 1
FIO2: 1
FIO2: 1
FIO2: 1
FIO2: 1
MECHVT: 400 mL
O2 SAT: 86.9 %
O2 SAT: 87.5 %
O2 SAT: 91.1 %
O2 SAT: 92.9 %
O2 Saturation: 84.3 %
PATIENT TEMPERATURE: 37
PATIENT TEMPERATURE: 37
PATIENT TEMPERATURE: 37
PATIENT TEMPERATURE: 37
PCO2 ART: 60 mmHg — AB (ref 32.0–48.0)
PEEP: 14 cmH2O
PH ART: 7.31 — AB (ref 7.350–7.450)
PH ART: 7.21 — AB (ref 7.350–7.450)
PO2 ART: 60 mmHg — AB (ref 83.0–108.0)
PO2 ART: 67 mmHg — AB (ref 83.0–108.0)
Patient temperature: 37
RATE: 30 resp/min
pCO2 arterial: 61 mmHg — ABNORMAL HIGH (ref 32.0–48.0)
pCO2 arterial: 63 mmHg — ABNORMAL HIGH (ref 32.0–48.0)
pCO2 arterial: 68 mmHg (ref 32.0–48.0)
pCO2 arterial: 69 mmHg (ref 32.0–48.0)
pH, Arterial: 7.31 — ABNORMAL LOW (ref 7.350–7.450)
pH, Arterial: 7.32 — ABNORMAL LOW (ref 7.350–7.450)
pH, Arterial: 7.38 (ref 7.350–7.450)
pO2, Arterial: 59 mmHg — ABNORMAL LOW (ref 83.0–108.0)
pO2, Arterial: 54 mmHg — ABNORMAL LOW (ref 83.0–108.0)
pO2, Arterial: 72 mmHg — ABNORMAL LOW (ref 83.0–108.0)

## 2018-04-18 LAB — GLUCOSE, CAPILLARY
GLUCOSE-CAPILLARY: 152 mg/dL — AB (ref 65–99)
GLUCOSE-CAPILLARY: 163 mg/dL — AB (ref 65–99)
GLUCOSE-CAPILLARY: 165 mg/dL — AB (ref 65–99)
GLUCOSE-CAPILLARY: 178 mg/dL — AB (ref 65–99)
GLUCOSE-CAPILLARY: 179 mg/dL — AB (ref 65–99)
GLUCOSE-CAPILLARY: 187 mg/dL — AB (ref 65–99)
Glucose-Capillary: 148 mg/dL — ABNORMAL HIGH (ref 65–99)
Glucose-Capillary: 158 mg/dL — ABNORMAL HIGH (ref 65–99)
Glucose-Capillary: 182 mg/dL — ABNORMAL HIGH (ref 65–99)
Glucose-Capillary: 206 mg/dL — ABNORMAL HIGH (ref 65–99)
Glucose-Capillary: 160 mg/dL — ABNORMAL HIGH (ref 65–99)
Glucose-Capillary: 140 mg/dL — ABNORMAL HIGH (ref 65–99)
Glucose-Capillary: 171 mg/dL — ABNORMAL HIGH (ref 65–99)
Glucose-Capillary: 136 mg/dL — ABNORMAL HIGH (ref 65–99)
Glucose-Capillary: 178 mg/dL — ABNORMAL HIGH (ref 65–99)
Glucose-Capillary: 150 mg/dL — ABNORMAL HIGH (ref 65–99)
Glucose-Capillary: 175 mg/dL — ABNORMAL HIGH (ref 65–99)
Glucose-Capillary: 143 mg/dL — ABNORMAL HIGH (ref 65–99)
Glucose-Capillary: 153 mg/dL — ABNORMAL HIGH (ref 65–99)
Glucose-Capillary: 142 mg/dL — ABNORMAL HIGH (ref 65–99)
Glucose-Capillary: 203 mg/dL — ABNORMAL HIGH (ref 65–99)
Glucose-Capillary: 203 mg/dL — ABNORMAL HIGH (ref 65–99)
Glucose-Capillary: 181 mg/dL — ABNORMAL HIGH (ref 65–99)
Glucose-Capillary: 191 mg/dL — ABNORMAL HIGH (ref 65–99)

## 2018-04-18 LAB — COMPREHENSIVE METABOLIC PANEL
ALBUMIN: 2.6 g/dL — AB (ref 3.5–5.0)
ALT: 21 U/L (ref 17–63)
AST: 25 U/L (ref 15–41)
Alkaline Phosphatase: 68 U/L (ref 38–126)
Anion gap: 10 (ref 5–15)
BUN: 67 mg/dL — ABNORMAL HIGH (ref 6–20)
CHLORIDE: 101 mmol/L (ref 101–111)
CO2: 28 mmol/L (ref 22–32)
CREATININE: 2.21 mg/dL — AB (ref 0.61–1.24)
Calcium: 8.1 mg/dL — ABNORMAL LOW (ref 8.9–10.3)
GFR calc Af Amer: 39 mL/min — ABNORMAL LOW (ref >60)
GFR, EST NON AFRICAN AMERICAN: 33 mL/min — AB (ref >60)
GLUCOSE: 138 mg/dL — AB (ref 65–99)
Potassium: 3.8 mmol/L (ref 3.5–5.1)
Sodium: 139 mmol/L (ref 135–145)
Total Bilirubin: 1.4 mg/dL — ABNORMAL HIGH (ref 0.3–1.2)
Total Protein: 7 g/dL (ref 6.5–8.1)

## 2018-04-18 LAB — CBC
HCT: 38.3 % — ABNORMAL LOW (ref 40.0–52.0)
Hemoglobin: 12.8 g/dL — ABNORMAL LOW (ref 13.0–18.0)
MCH: 28.8 pg (ref 26.0–34.0)
MCHC: 33.4 g/dL (ref 32.0–36.0)
MCV: 86.2 fL (ref 80.0–100.0)
PLATELETS: 201 10*3/uL (ref 150–440)
RBC: 4.44 MIL/uL (ref 4.40–5.90)
RDW: 18.8 % — ABNORMAL HIGH (ref 11.5–14.5)
WBC: 13.2 10*3/uL — ABNORMAL HIGH (ref 3.8–10.6)

## 2018-04-18 LAB — BASIC METABOLIC PANEL
Anion gap: 14 (ref 5–15)
BUN: 78 mg/dL — AB (ref 6–20)
CALCIUM: 7.9 mg/dL — AB (ref 8.9–10.3)
CO2: 26 mmol/L (ref 22–32)
CREATININE: 2.86 mg/dL — AB (ref 0.61–1.24)
Chloride: 99 mmol/L — ABNORMAL LOW (ref 101–111)
GFR calc Af Amer: 28 mL/min — ABNORMAL LOW (ref >60)
GFR, EST NON AFRICAN AMERICAN: 24 mL/min — AB (ref >60)
GLUCOSE: 279 mg/dL — AB (ref 65–99)
POTASSIUM: 4.2 mmol/L (ref 3.5–5.1)
Sodium: 139 mmol/L (ref 135–145)

## 2018-04-18 LAB — HEPARIN LEVEL (UNFRACTIONATED): Heparin Unfractionated: 0.33 IU/mL (ref 0.30–0.70)

## 2018-04-18 LAB — PLATELET COUNT: Platelets: 182 10*3/uL (ref 150–440)

## 2018-04-18 LAB — FIBRINOGEN
Fibrinogen: >750 mg/dL — ABNORMAL HIGH (ref 210–475)
Fibrinogen: >750 mg/dL — ABNORMAL HIGH (ref 210–475)

## 2018-04-18 LAB — HEMOGLOBIN AND HEMATOCRIT, BLOOD
HEMATOCRIT: 37.7 % — AB (ref 40.0–52.0)
Hemoglobin: 11.5 g/dL — ABNORMAL LOW (ref 13.0–18.0)

## 2018-04-18 LAB — MAGNESIUM: Magnesium: 2.3 mg/dL (ref 1.7–2.4)

## 2018-04-18 LAB — PROTIME-INR
INR: 1.37
PROTHROMBIN TIME: 16.8 s — AB (ref 11.4–15.2)

## 2018-04-18 LAB — PHOSPHORUS: Phosphorus: 5.3 mg/dL — ABNORMAL HIGH (ref 2.5–4.6)

## 2018-04-18 LAB — APTT: APTT: 83 s — AB (ref 24–36)

## 2018-04-18 LAB — PROCALCITONIN: Procalcitonin: 1.54 ng/mL

## 2018-04-18 LAB — VANCOMYCIN, RANDOM: VANCOMYCIN RM: 16

## 2018-04-18 LAB — TROPONIN I: TROPONIN I: 0.11 ng/mL — AB (ref <0.03)

## 2018-04-18 MED ORDER — STERILE WATER FOR INJECTION IJ SOLN
INTRAMUSCULAR | Status: AC
  Administered 2018-04-18: 16:00:00
  Filled 2018-04-18: qty 10

## 2018-04-18 MED ORDER — METOPROLOL TARTRATE 50 MG PO TABS
50.0000 mg | ORAL_TABLET | Freq: Two times a day (BID) | ORAL | Status: DC
  Administered 2018-04-18: 50 mg
  Filled 2018-04-18: qty 1

## 2018-04-18 MED ORDER — EPINEPHRINE PF 1 MG/ML IJ SOLN: 1.0000 mg | Freq: Once | INTRAMUSCULAR | Status: DC

## 2018-04-18 MED ORDER — AMIODARONE HCL IN DEXTROSE 360-4.14 MG/200ML-% IV SOLN
INTRAVENOUS | Status: AC
  Filled 2018-04-18: qty 200

## 2018-04-18 MED ORDER — VASOPRESSIN 20 UNIT/ML IV SOLN
0.0300 [IU]/min | INTRAVENOUS | Status: DC
  Administered 2018-04-18: 0.03 [IU]/min via INTRAVENOUS
  Filled 2018-04-18: qty 2

## 2018-04-18 MED ORDER — ATROPINE SULFATE 1 MG/10ML IJ SOSY
PREFILLED_SYRINGE | INTRAMUSCULAR | Status: AC
  Administered 2018-04-18: 1 mg via INTRAVENOUS
  Filled 2018-04-18: qty 10

## 2018-04-18 MED ORDER — INSULIN GLARGINE 100 UNIT/ML ~~LOC~~ SOLN
80.0000 [IU] | Freq: Two times a day (BID) | SUBCUTANEOUS | Status: DC
  Administered 2018-04-18 – 2018-04-20 (×5): 80 [IU] via SUBCUTANEOUS
  Filled 2018-04-18 (×6): qty 0.8

## 2018-04-18 MED ORDER — LORAZEPAM 2 MG/ML IJ SOLN
2.0000 mg | INTRAMUSCULAR | Status: DC | PRN
  Administered 2018-04-18 – 2018-04-19 (×2): 2 mg via INTRAVENOUS
  Filled 2018-04-18 (×2): qty 1

## 2018-04-18 MED ORDER — SODIUM CHLORIDE 0.9 % IV BOLUS
1000.0000 mL | Freq: Once | INTRAVENOUS | Status: AC
  Administered 2018-04-18: 1000 mL via INTRAVENOUS

## 2018-04-18 MED ORDER — FUROSEMIDE 10 MG/ML IJ SOLN
60.0000 mg | Freq: Once | INTRAMUSCULAR | Status: AC
  Administered 2018-04-18: 60 mg via INTRAVENOUS
  Filled 2018-04-18: qty 6

## 2018-04-18 MED ORDER — VANCOMYCIN HCL 10 G IV SOLR
1250.0000 mg | INTRAVENOUS | Status: DC
  Administered 2018-04-18: 1250 mg via INTRAVENOUS
  Filled 2018-04-18 (×2): qty 1250

## 2018-04-18 MED ORDER — AMIODARONE HCL IN DEXTROSE 360-4.14 MG/200ML-% IV SOLN: 60.0000 mg/h | INTRAVENOUS | Status: DC

## 2018-04-18 MED ORDER — GLUCAGON HCL RDNA (DIAGNOSTIC) 1 MG IJ SOLR: 1.0000 mg | Freq: Once | INTRAMUSCULAR | Status: DC

## 2018-04-18 MED ORDER — AMIODARONE HCL IN DEXTROSE 360-4.14 MG/200ML-% IV SOLN: 15.0000 mg/h | INTRAVENOUS | Status: DC

## 2018-04-18 MED ORDER — ATROPINE SULFATE 1 MG/10ML IJ SOSY
1.0000 mg | PREFILLED_SYRINGE | Freq: Once | INTRAMUSCULAR | Status: AC
  Administered 2018-04-18: 1 mg via INTRAVENOUS

## 2018-04-18 MED ORDER — SODIUM CHLORIDE 0.9 % IV SOLN
0.0000 ug/min | INTRAVENOUS | Status: DC
  Filled 2018-04-18: qty 4

## 2018-04-18 MED ORDER — NOREPINEPHRINE 4 MG/250ML-% IV SOLN
0.0000 ug/min | INTRAVENOUS | Status: DC
Start: 2018-04-18 — End: 2018-04-19
  Administered 2018-04-18: 15 ug/min via INTRAVENOUS

## 2018-04-18 MED ORDER — AMIODARONE HCL IN DEXTROSE 360-4.14 MG/200ML-% IV SOLN
30.0000 mg/h | INTRAVENOUS | Status: DC
  Administered 2018-04-18 – 2018-04-19 (×2): 30 mg/h via INTRAVENOUS
  Filled 2018-04-18: qty 200

## 2018-04-18 MED ORDER — SODIUM CHLORIDE 0.9 % IV SOLN
2.0000 g | Freq: Two times a day (BID) | INTRAVENOUS | Status: DC
  Administered 2018-04-18 – 2018-04-21 (×7): 2 g via INTRAVENOUS
  Filled 2018-04-18 (×8): qty 2

## 2018-04-18 MED ORDER — EPINEPHRINE PF 1 MG/10ML IJ SOSY
PREFILLED_SYRINGE | INTRAMUSCULAR | Status: AC
  Administered 2018-04-18: 1 mg
  Filled 2018-04-18: qty 10

## 2018-04-18 NOTE — Progress Notes (Signed)
On first shift assessment pt temp found to be 102.8. Tylenol given, ice packs applied, NP aware. Currently pt temp down to 100.8.  Wife at bedside. Will continue to monitor.

## 2018-04-18 NOTE — Progress Notes (Signed)
Pt began to desat into low 80s around 2300, writing nurse suctioned pt 2-3 times with minimal thin secretions. Also gave pt 02 breaths back up to high 80s with repositioning and RT at beside managing vent. Pt placed on 100% Fi02 MD notified, review of chart, 60mg  of lasix given per order and ABG obtained by RT. Pt sats 88-89%. Pt began to desat earlier in am again to mid 80s. RT and MD notified. Order to increase Peep to 12 and administer 60mg  additional lasix, as pt output responded to first dose. Sats not increasing, MD notified, order to increase Peep to 14, order followed.

## 2018-04-18 NOTE — Progress Notes (Signed)
   04/18/18 1700  Clinical Encounter Type  Visited With Patient and family together  Visit Type Initial;Spiritual support  Referral From Nurse  Consult/Referral To Chaplain  Spiritual Encounters  Spiritual Needs Prayer;Emotional  Stress Factors  Patient Stress Factors Health changes  Family Stress Factors Health changes  CH responded to RRT in CCU Rm 1, CH acknowledged the acute clinical situation and commensurate distress of a family member sitting in the room, later noted to be the patient Alexander Alexander's wife. Pastoral presence ensued, and I was asked by his wife to pray for her husband, which I did. The acuity of Alexander Alexander's situation deescalated, and he seemed to stabilize clinically, and I surmise immense grief was lifted at the conclusion of the RRT. The [facility] was [facility] would be rounding later to check on her and her husband.

## 2018-04-18 NOTE — Care Management (Signed)
Per rep with [facility] patient will need Trach 5-7 days without complication to consider for LTAC. Trach pending by Monday. Coumadin.

## 2018-04-18 NOTE — Progress Notes (Signed)
eLink Physician-Brief Progress Note Patient Name: Alexander RiffleOtis Leon Alexander DOB: 03-Sep-1971 MRN: [id number]   Date of Service  04/18/2018  HPI/Events of Note  Hypoxia - Sat = 86%. Currenlty on 100%/P 12.  eICU Interventions  Will increase PEEP to 14.      Intervention Category Major Interventions: Hypoxemia - evaluation and management  [employer] [place] 04/18/2018, 5:10 AM

## 2018-04-18 NOTE — Progress Notes (Signed)
eLink Physician-Brief Progress Note Patient Name: Alexander Alexander DOB: 03/21/1971 MRN: [id number]   Date of Service  04/18/2018  HPI/Events of Note  ABG on 100%/PRVC 16/TV 550/P 10 = 7.31/68/67.  eICU Interventions  Will order: 1. Increase PEEP to 12.  2. Increase PRVC rate to 19.  3. ABG at 5 AM.     Intervention Category Major Interventions: Acid-Base disturbance - evaluation and management;Respiratory failure - evaluation and management;Hypoxemia - evaluation and management  Lenell AntuSommer,Gailene Youkhana Eugene 04/18/2018, 12:11 AM

## 2018-04-18 NOTE — Progress Notes (Addendum)
PULMONARY / CRITICAL CARE MEDICINE   Name: Alexander Alexander MRN: [id number] DOB: 1970/11/10    ADMISSION DATE:  04/07/2018  PT PROFILE:   Alexander Alexander is an 47 y.o. male. 47 year old gentleman with past medical history of atrial fibrillation, status post ablation and aortic valve replacement on warfarin, history of congestive heart failure, chronic renal disease, morbid obesity, diabetes, hyperlipidemia, obstructive sleep apnea and hypertension, suffered a pulseless VT cardiac arrest in MRI when he was being evaluated for back pain and sciatica, subsequently intubated in the intensive care unit.  EVENTS/RESULTS: 06/11 CT chest: Dense RLL consolidation.  Segmental RML and LLL atelectasis.  Patchy GGO in upper lobes bilaterally   INDWELLING DEVICES:: R IJ CVL 05/31 >> 06/10 ETT 05/31 >>  RUE PICC 06/10 >>    MICRO DATA: MRSA PCR 05/31 >> NEG Urine 06/01 >> NEG Resp 06/01 >> MRSA Blood 06/01 >> NEG C diff 06/04 >> NEG  Urine 06/05 >> NEG Resp 06/05 >> MRSA Blood 06/05 >> NEG Resp 06/11 >>   ANTIMICROBIALS:  Pip-tazo 05/31 >> 06/03 Vanc 05/31 >>  Cefepime 06/11 >>   SUBJECTIVE:  RASS -3, awakens and + F/C per RN. No SBT performed  VITAL SIGNS: BP 115/67   Pulse (!) 125   Temp 99.6 F (37.6 C) (Oral)   Resp 14   Ht 5\' 10"  (1.778 m) Comment: based on previous notes  Wt (!) 414 lb (187.8 kg)   SpO2 93%   BMI 59.40 kg/m   HEMODYNAMICS:    VENTILATOR SETTINGS: Vent Mode: PRVC FiO2 (%):  [55 %-100 %] 100 % Set Rate:  [16 bmp-30 bmp] 30 bmp Vt Set:  [400 mL-550 mL] 400 mL PEEP:  [5 cmH20-14 cmH20] 14 cmH20 Plateau Pressure:  [24 cmH20-28 cmH20] 26 cmH20  INTAKE / OUTPUT: I/O last 3 completed shifts: In: 4380.7 [I.V.:2820.4; NG/GT:1560.3] Out: 6365 [Urine:6115; Stool:250]  PHYSICAL EXAMINATION: General: Intubated, sedated, + F/C Neuro: Cranial nerves intact, no focal deficits HEENT: NCAT, sclerae white Cardiovascular: IRIR, no M Lungs: no wheezes or  other adventitious sounds anteriorly Abdomen: distended, soft, diminished BS Ext: symmetric LE edema Skin: No lesions noted  LABS:  BMET Recent Labs  Lab 04/16/18 0430 04/16/18 1140 04/17/18 0404 04/18/18 0429  NA 140  --  143 139  K 3.8 3.7 3.9 3.8  CL 103  --  105 101  CO2 27  --  30 28  BUN 70*  --  69* 67*  CREATININE 2.26*  --  2.24* 2.21*  GLUCOSE 148*  --  163* 138*    Electrolytes Recent Labs  Lab 04/12/18 0421  04/16/18 0430 04/16/18 1140 04/17/18 0404 04/18/18 0429  CALCIUM 8.6*   < > 8.6*  --  8.6* 8.1*  MG 2.0  --   --  2.5* 2.5*  --   PHOS 4.2  --   --   --  3.4  --    < > = values in this interval not displayed.    CBC Recent Labs  Lab 04/16/18 0430 04/17/18 1033 04/18/18 0429  WBC 14.1* 13.5* 13.2*  HGB 13.2 12.8* 12.8*  HCT 40.7 39.9* 38.3*  PLT 185 188 201    Coag's Recent Labs  Lab 04/12/18 0421 04/13/18 0424 04/14/18 0501  INR 1.10 1.09 1.23    Sepsis Markers Recent Labs  Lab 04/18/18 0921  PROCALCITON 1.54    ABG Recent Labs  Lab 04/17/18 2357 04/18/18 0328 04/18/18 0555  PHART 7.31* 7.31* 7.38  PCO2ART  68* 69* 60*  PO2ART 67* 59* 54*    Liver Enzymes Recent Labs  Lab 04/12/18 0421 04/18/18 0429  AST 83* 25  ALT 37 21  ALKPHOS 48 68  BILITOT 1.6* 1.4*  ALBUMIN 2.9* 2.6*    Cardiac Enzymes No results for input(s): TROPONINI, PROBNP in the last 168 hours.  Glucose Recent Labs  Lab 04/18/18 0817 04/18/18 0906 04/18/18 0956 04/18/18 1100 04/18/18 1203 04/18/18 1302  GLUCAP 140* 171* 136* 178* 150* 175*    CXR: NSC   ASSESSMENT / PLAN:  PULMONARY A: Respiratory arrest 05/31 Prolonged ventilator dependence Persistently high FiO2/PEEP requirements P:   Cont vent support - settings reviewed and/or adjusted Cont vent bundle Daily SBT if/when meets criteria ENT consultation requested 6/10 for tracheostomy tube placement  CARDIOVASCULAR A:  VT cardiac arrest 5/31 (felt secondary to  respiratory rest) Chronic atrial fibrillation P:  MAP goal > 65 mmHg Continue heparin infusion Enteral metoprolol initiated 6/11 IV metoprolol as needed to maintain HR <115/min  RENAL A:   CKD, baseline creatinine 2.0-2.5 Hypervolemia P:   Monitor BMET intermittently Monitor I/Os Correct electrolytes as indicated  Continue diuresis as permitted by BP and renal function  GASTROINTESTINAL A:   Abdominal distention P:   SUP: Enteral famotidine Continue TF protocol  HEMATOLOGIC A:   Very mild ICU acquired anemia P:  DVT px: heparin infusion (A. fib) Monitor CBC intermittently Transfuse per usual guidelines   INFECTIOUS A:   MRSA pneumonia Persistent fevers P:   Monitor temp, WBC count Micro and abx as above   ENDOCRINE A:   DM 2 with severe insulin resistance P:   Continue insulin infusion Initiate Lantus 6/11 with goal of transitioning to SSI  NEUROLOGIC A:   Acute encephalopathy, improved P:   RASS goal: 1, -2 Continue PAD protocol -fentanyl, propofol infusions   FAMILY  Wife updated at bedside   CCM time: 35 mins The above time includes time spent in consultation with patient and/or family members and reviewing care plan on multidisciplinary rounds  Billy Fischeravid Tonya Wantz, MD PCCM service Mobile [phone] Pager [phone]    04/18/2018, 1:52 PM

## 2018-04-18 NOTE — Progress Notes (Signed)
Approx 1545 pt noted hypotensive, Writer entered room to assess and patient soon had sudden bradycardia in 40s to 450s.  Dr Belia Hemankasa called to room and patient was medicated emergently with atropine and epi without noticeable improvement. All sedation stopped, Amiodarone bolus given and infusion started, ok to increases to 30 mg/hr per Dr Belia HemanKasa, Levophed and vaso infusions started, amiodarone stopped per Dr Belia HemanKasa, labs drawn, pt with acidosis per ABG, 1 liter bolus given, HR and BP became elevated during bolus, Levo and vaso stopped, Dr Belia Hemankasa aware, Pt awake and appeared uncomfortable.  Per Dr Belia HemanKasa we restarted fentanyl and also ordered lorazepam.  VSS slowly improving at this time, pt appears more comfortable.  Wife at bedside during this incident, very supportive of patient and staff

## 2018-04-18 NOTE — Progress Notes (Signed)
Pharmacy Antibiotic Note  Alexander Alexander is a 47 y.o. male admitted on 04/07/2018 s/p cardiac arrest started on antibiotics for MRSA Pneumonia Pharmacy has been consulted for Vancomycin dosing. Patient with fevers overnight up to 102.28F. Patient sputum cultures continue to show few staph aureus, otherwise no growth in blood or urine cultures. Vancomycin trough is 16 on 6/7 after 4 doses of 1250mg  IV q18h. Serum creatinine levels are improving and trending down, currently at 1.89. Will adjust vancomycin dose to goal trough 15-20 and continue to monitor.  Plan: Vancomycin 1000 IV q12h for goal trough of 15-20. Patient is morbidly obese and with AKI will monitor serum creatinine and will obtain follow up vancomycin level 6/9 at 1400 before fourth dose.  06/10 @ 0500 VR 17. Will give a vanc 1.25g IV x 1 and recheck VR w/ am labs. If renal function improves will place patient on a regimen.   6/11 AM vanc level 16. Will start on vancomycin 1250 mg  Daily. Level before 3rd dose to confirm clearance. SCr with tomorrow AM labs.  Height: 5\' 10"  (177.8 cm)(based on previous notes) Weight: (!) 415 lb (188.2 kg) IBW/kg (Calculated) : 73  Temp (24hrs), Avg:99.8 F (37.7 C), Min:98.7 F (37.1 C), Max:100.8 F (38.2 C)  Recent Labs  Lab 04/13/18 0424 04/14/18 0103 04/14/18 0501 04/15/18 0421 04/16/18 0430 04/16/18 1415 04/17/18 0404 04/17/18 1033 04/18/18 0328 04/18/18 0429  WBC  --   --  15.5* 14.6* 14.1*  --   --  13.5*  --  13.2*  CREATININE 2.14*  --  1.89* 2.38* 2.26*  --  2.24*  --   --   --   VANCOTROUGH  --  16  --   --   --  32*  --   --   --   --   VANCORANDOM  --   --   --   --   --   --  17  --  16  --     Estimated Creatinine Clearance: 67.9 mL/min (A) (by C-G formula based on SCr of 2.24 mg/dL (H)).    Allergies  Allergen Reactions  . Lexapro [Escitalopram Oxalate] Swelling    Antimicrobials this admission: Zosyn 5/31 >> 6/3 Vancomycin 6/2 >> Unasyn 6/3 x 1.   Dose  adjustments this admission: 6/3 Vancomycin transitioned to 1250mg  IV Q12hr.  6/4 Vancomycin transitioned to 1250mg  IV Q18hr.  6/7 Vancomycin transitioned to 1000mg  IV Q12hr. 6/11 Started vancomycin 1250 mg q 24 hours.   Microbiology results: 5/31 MRSA PCR: negative 6/1 BCx: no growth 6/1 UCx: no growth  6/1 Resp Cx/Trach Aspirate: MRSA  6/5 BCx: no growth x2 days 6/5 UCx: no growth   6/5 SputumCx: few staph aureus 6/5 CDiff: negative  Thank you for allowing pharmacy to be a part of this patient's care.  Thomasene Rippleavid Besanti, PharmD, BCPS Clinical Pharmacist 04/18/2018

## 2018-04-18 NOTE — Progress Notes (Signed)
CALLED TO BESIDE FOR NEAR CARDIAC ARREST AROUND 315PM  CHIEF COMPLAINT:   Severe resp failure  Subjective  Was called to bedside, patient with multiorgan failure HR down to 30 Low BP Patient given atropine did NOT affect HR EPI was given-HR sustained around 200 with SVT and afib Amiodarone started then stopped Wife at bedside-updated Patient near cardiac arrest Critically ill    Objective   Examination: Obtunded +resp distress Cold and clammy   CT chest shows dense consolidation RT lung  VITALS:  height is 5\' 10"  (1.778 m) and weight is 414 lb (187.8 kg) (abnormal). His oral temperature is 99.6 F (37.6 C). His blood pressure is 80/56 (abnormal) and his pulse is 105 (abnormal). His respiration is 0 (abnormal) and oxygen saturation is 93%.   I personally reviewed Labs under Results section.   Assessment/Plan:  Severe resp failure from MRSA pneumonia complicated by severe shock with multiorgan failure With severe hypoxia and renal failure   1.continue vent support 2.vasopressors support keep MAP >65 3.repeat all labs 4.hold heparin and sedatives for now 5.restarted IV abx earlier today    Critical Care Time devoted to patient care services described in this note is 45 minutes.   Overall, patient is critically ill, prognosis is guarded.  Patient with Multiorgan failure and at high risk for cardiac arrest and death.    Lucie LeatherKurian David Evanthia Maund, M.D.  [facility]  Medical Director [facility] Medical Director [facility]

## 2018-04-18 NOTE — Progress Notes (Signed)
eLink Physician-Brief Progress Note Patient Name: Alexander RiffleOtis Leon Alexander DOB: [birth year] MRN: [id number]   Date of Service  04/18/2018  HPI/Events of Note  . ABG on 100%/PRVC 19/TV 660/P 12 + 7.31/69/59/35. Bedside nurse states that the patient diuresed about a liter after the 60 mg dose of Lasix earlier.   eICU Interventions  Will order: 1. Increase PRVC rate to 22. 2. ABG at 6 AM. 3. Lasix 60 mg IV now.      Intervention Category Major Interventions: Acid-Base disturbance - evaluation and management;Respiratory failure - evaluation and management  Lenell AntuSommer,Steven Eugene 04/18/2018, 3:53 AM

## 2018-04-18 NOTE — Progress Notes (Signed)
[place] for heparin drip management Indication: atrial fibrillation and aortic valve replacement   Allergies  Allergen Reactions  . Lexapro [Escitalopram Oxalate] Swelling    Patient Measurements: Height: 5' 10"  (177.8 cm)(based on previous notes) Weight: (!) 415 lb (188.2 kg) IBW/kg (Calculated) : 73 Heparin Dosing Weight: 111kg  Vital Signs: Temp: 100.8 F (38.2 C) (06/11 0200) Temp Source: Oral (06/11 0200) BP: 124/83 (06/11 0500) Pulse Rate: 119 (06/11 0500)  Labs: Recent Labs    04/16/18 0430 04/17/18 0404 04/17/18 1033 04/17/18 1625 04/18/18 0429  HGB 13.2  --  12.8*  --  12.8*  HCT 40.7  --  39.9*  --  38.3*  PLT 185  --  188  --  201  HEPARINUNFRC 0.31 0.29* 0.43 0.46 0.33  CREATININE 2.26* 2.24*  --   --   --     Estimated Creatinine Clearance: 67.9 mL/min (A) (by C-G formula based on SCr of 2.24 mg/dL (H)).   Medical History: Past Medical History:  Diagnosis Date  . Allergy   . Anxiety   . Arrhythmia   . Arthritis   . Atrial fibrillation ([place])   . CHF (congestive heart failure) ([place])   . CKD (chronic kidney disease)   . Diabetes mellitus without complication ([place])   . Diabetes mellitus, type II ([place])   . Heart failure ([place])   . Hyperlipidemia   . Hypertension   . Pancreatitis   . Sleep apnea     Medications:  Scheduled:  . amiodarone  400 mg Per Tube BID  . chlorhexidine gluconate (MEDLINE KIT)  15 mL Mouth Rinse BID  . famotidine  20 mg Per Tube Daily  . feeding supplement (PRO-STAT SUGAR FREE 64)  60 mL Per Tube TID  . free water  200 mL Per Tube Q8H  . insulin glargine  40 Units Subcutaneous BID  . mouth rinse  15 mL Mouth Rinse 10 times per day  . multivitamin  15 mL Per Tube Daily  . sodium chloride flush  10-40 mL Intracatheter Q12H  . sodium chloride flush  10-40 mL Intracatheter Q12H   Infusions:  . feeding supplement (VITAL HIGH PROTEIN) 50 mL/hr at 04/18/18 0200  . fentaNYL infusion  INTRAVENOUS 250 mcg/hr (04/18/18 0519)  . heparin 2,300 Units/hr (04/18/18 0419)  . insulin (NOVOLIN-R) infusion 14 Units/hr (04/18/18 0518)  . propofol (DIPRIVAN) infusion 15 mcg/kg/min (04/18/18 0519)    Assessment: Pharmacy consulted for warfarin and heparin drip management for 47 yo male admitted to the ICU s/p cardiac arrest. Patient has history significant for aortic valve replacement, atrial fibrillation and CHF. Head CT is negative for infarction or hemorrhage. Heparin level for today continues to be subtherapeutic at 0.28, with a goal of 0.3-0.7 units/mL. Confirmed with RN that heparin drip has not been interrupted. Per protocol, will re-bolus heparin and increase rate of heparin drip. Will obtain heparin level at 2030 and continue to monitor and adjust as needed.  Patient takes warfarin 62m daily as an outpatient for goal INR 2.5-3.5.    Goal of Therapy:  INR 2-3 Heparin level 0.3-0.7 units/ml Monitor platelets by anticoagulation protocol: Yes   Plan:  06/10 @ 0400 HL 0.29 subtherapeutic. Will rebolus w/ 1800 units IV x 1 and will increase rate to 2300 units/hr and will recheck @ 1100. Hgb trending down but stable will continue to monitor.  6/10 @1100  HL 0.43- Level is therapeutic x 1. Will order confirmatory level in 6 hours.  04/17/18 16:25 HL therapeutic x 1. Continue current rate. Will monitor HL/CBC daily per protocol.   6/11 AM heparin level 0.33, Continue current regimen. Recheck heparin level and CBC with tomorrow AM labs.  Eloise Harman, PharmD, BCPS Clinical Pharmacist 04/18/2018 6:00 AM

## 2018-04-18 NOTE — Progress Notes (Addendum)
[employer] [facility] [facility] [place] [facility]   PATIENT NAME: Alexander Alexander    MR#:  [id number]  DATE OF BIRTH:  1971-06-01  SUBJECTIVE:   Patient seen and evaluated today -Patient is status post cardiac arrest.  Obese male. -Intubated and on ventilator.  Has thick respiratory secretions.  Being treated for MRSA pneumonia. -On heparin drip for A. fib and aortic valve replacement.   - sedation with propofol and fentanyl -Yesterday evening patient became hypoxic and his oxygen FiO2 on ventilator had to be increased to 100%  -patient had a CT chest this morning   REVIEW OF SYSTEMS:  Review of Systems  Unable to perform ROS: Critical illness  Gastrointestinal: Positive for heartburn.    DRUG ALLERGIES:   Allergies  Allergen Reactions  . Lexapro [Escitalopram Oxalate] Swelling    VITALS:  Blood pressure 115/67, pulse (!) 125, temperature 99.6 F (37.6 C), temperature source Oral, resp. rate 14, height 5\' 10"  (1.778 m), weight (!) 187.8 kg (414 lb), SpO2 93 %.  PHYSICAL EXAMINATION:  Physical Exam  GENERAL:  47 y.o.-year-old morbidly obese patient lying in the bed, critically ill appearing. EYES: Pupils equal, round, reactive to light and accommodation. No scleral icterus. Extraocular muscles intact.  HEENT: Head atraumatic, normocephalic.  Orally intubated, OG tube present.  Increased secretions noted. NECK:  Supple, no jugular venous distention. No thyroid enlargement, no tenderness.  LUNGS: Coarse breath sounds bilaterally, decreased at the bases.  Rales in both lungs. No use of accessory muscles of respiration.  Ventilator setting Tidal volume : 400 Rate : 14 Fio2 : 100 CARDIOVASCULAR: S1, S2 normal. No  rubs, or gallops. Loud 3/6 systolic murmur present ABDOMEN: Soft, obese, nontender, nondistended. Bowel sounds present. No organomegaly or mass.  EXTREMITIES: No pedal edema, cyanosis, or clubbing.  NEUROLOGIC: Patient is sedated today and not following  commands PSYCHIATRIC: The patient is sedated today SKIN: No obvious rash, lesion, or ulcer.    LABORATORY PANEL:   CBC Recent Labs  Lab 04/18/18 0429  WBC 13.2*  HGB 12.8*  HCT 38.3*  PLT 201   ------------------------------------------------------------------------------------------------------------------  Chemistries  Recent Labs  Lab 04/17/18 0404 04/18/18 0429  NA 143 139  K 3.9 3.8  CL 105 101  CO2 30 28  GLUCOSE 163* 138*  BUN 69* 67*  CREATININE 2.24* 2.21*  CALCIUM 8.6* 8.1*  MG 2.5*  --   AST  --  25  ALT  --  21  ALKPHOS  --  68  BILITOT  --  1.4*   ------------------------------------------------------------------------------------------------------------------  Cardiac Enzymes No results for input(s): TROPONINI in the last 168 hours. ------------------------------------------------------------------------------------------------------------------  RADIOLOGY:  Dg Abd 1 View  Result Date: 04/18/2018 CLINICAL DATA:  Orogastric tube placement EXAM: ABDOMEN - 1 VIEW COMPARISON:  None. FINDINGS: Orogastric tube tip is slightly below the gastroesophageal junction in the proximal gastric cardia. The side port is felt to be above the diaphragm. There is a relative paucity of gas in the visualized abdominal region. No obstruction or free air evident. IMPRESSION: Orogastric tube tip just below the gastroesophageal junction. Advise advancing orogastric tube approximately 8-10 cm to ensure that side-port as well as tube tip are in the stomach. Relative paucity of gas present. This finding may be seen normally but also may be indicative of early ileus or enteritis. Electronically Signed   By: Bretta Bang III M.D.   On: 04/18/2018 11:40   Ct Chest Wo Contrast  Result Date: 04/18/2018 CLINICAL DATA:  Inpatient. Acute  respiratory illness. Desaturation. Intubated. EXAM: CT CHEST WITHOUT CONTRAST TECHNIQUE: Multidetector CT imaging of the chest was performed  following the standard protocol without IV contrast. COMPARISON:  Chest radiograph from earlier today. FINDINGS: Examination is significantly limited by patient body habitus and by streak artifact from the patient's upper extremities. Cardiovascular: Mild cardiomegaly. No significant pericardial effusion/thickening. Aortic valve prosthesis is in place. Coronary atherosclerosis. Atherosclerotic thoracic aorta with ectatic 4.3 cm ascending thoracic aorta. Dilated main pulmonary artery (3.9 cm diameter). Mediastinum/Nodes: No discrete thyroid nodules. Enteric tube terminates in the proximal stomach. No pathologically enlarged axillary, mediastinal or hilar lymph nodes, noting limited sensitivity for the detection of hilar adenopathy on this noncontrast study. Lungs/Pleura: No pneumothorax. No convincing pleural effusions. Endotracheal tube tip is 8.7 cm above the carina. Complete right lower lobe consolidation with scattered air bronchograms and some volume loss. Segmental right middle lobe and medial left lower lobe atelectasis. Patchy ground-glass opacities in the upper lobes bilaterally. Patchy nodular foci of consolidation in the right upper lobe measuring up to 9 mm (series 3/image 39). Upper abdomen: Diffuse hepatic steatosis. Musculoskeletal: No aggressive appearing focal osseous lesions. Moderate symmetric gynecomastia. Discontinuity in the lower most sternotomy wire. Marked thoracic spondylosis. IMPRESSION: 1. Limited scan. Complete right lower lobe consolidation with scattered air bronchograms and some volume loss. Patchy ground-glass opacities in the bilateral upper lobes. Patchy nodular foci of consolidation in the right upper lobe. Multilobar pneumonia is suspected, with superimposed atelectasis in the right greater than left lungs. 2.  Endotracheal tube tip is 8.7 cm above the carina. 3. Mild cardiomegaly. Dilated main pulmonary artery, suggesting pulmonary arterial hypertension. 4. Ectatic 4.3 cm  ascending thoracic aorta. Recommend annual imaging followup by CTA or MRA. This recommendation follows 2010 ACCF/AHA/AATS/ACR/ASA/SCA/SCAI/SIR/STS/SVM Guidelines for the Diagnosis and Management of Patients with Thoracic Aortic Disease. Circulation. 2010; 121: J191-Y782. 5. Diffuse hepatic steatosis. 6. Moderate symmetric gynecomastia. Aortic Atherosclerosis (ICD10-I70.0). Electronically Signed   By: Delbert Phenix M.D.   On: 04/18/2018 10:56   Dg Chest Port 1 View  Result Date: 04/18/2018 CLINICAL DATA:  Hypoxia EXAM: PORTABLE CHEST 1 VIEW COMPARISON:  Chest CT April 18, 2018 FINDINGS: Endotracheal tube tip is 4.8 cm above the carina. Orogastric tube tip is below the diaphragm and not seen on this study. No pneumothorax. There is a small right pleural effusion with patchy consolidation in both lower lobes, more on the right than on the left. The lungs elsewhere are clear. 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 No adenopathy evident. Patient is status post median sternotomy. No bone lesions. IMPRESSION: Tube positions as described without evident pneumothorax. Right pleural effusion with bibasilar consolidation, more on the left than on the right, stable. Underlying pulmonary vascular congestion. Electronically Signed   By: Bretta Bang III M.D.   On: 04/18/2018 11:39   Dg Chest Port 1 View  Result Date: 04/18/2018 CLINICAL DATA:  Acute respiratory distress. EXAM: PORTABLE CHEST 1 VIEW COMPARISON:  16 2019 FINDINGS: Endotracheal tube tip is 6 cm above the carina. Nasogastric tube enters the abdomen. Right subclavian central line tip at the SVC RA junction. Support artifact overlies the chest. Left lung is largely clear, possibly with mild basilar atelectasis. There is a right effusion more pronounced atelectasis in the right lower. IMPRESSION: Overlying artifact. Atelectasis right worse than left. Right effusion. Electronically Signed   By: Paulina Fusi M.D.   On: 04/18/2018 07:47   Dg  Chest Port 1 View  Result Date: 04/17/2018 CLINICAL DATA:  PICC line placement  EXAM: PORTABLE CHEST 1 VIEW COMPARISON:  Chest radiograph 04/17/2018 FINDINGS: Right-sided PICC line tip is in the mid SVC. There is a right IJ approach central venous catheter with its tip at the cavoatrial junction, unchanged. Endotracheal tube and gastric tube are unchanged. Lung the mediastinum are unchanged. IMPRESSION: Unchanged examination with right-sided PICC line tip in the mid SVC. Electronically Signed   By: Deatra Robinson M.D.   On: 04/17/2018 17:43   Dg Chest Port 1 View  Result Date: 04/17/2018 CLINICAL DATA:  47 year old male with a history of PICC placement EXAM: PORTABLE CHEST 1 VIEW COMPARISON:  04/17/2018, 04/14/2018 FINDINGS: Study is limited by underpenetration. Redemonstration of surgical changes of median sternotomy. Right IJ central venous catheter is unchanged, with the tip appearing to terminate superior vena cava. Endotracheal tube terminates 4.2 cm above the carina. Gastric tube terminates out of the field of view. Lung volumes remain low with patchy airspace opacities bilaterally. No evidence of left or upper extremity PICC. IMPRESSION: Low lung volumes persist with unchanged appearance of patchy airspace opacities bilaterally. No PICC is visualized. Unchanged position of right IJ central venous catheter, endotracheal tube, gastric tube. Electronically Signed   By: Gilmer Mor D.O.   On: 04/17/2018 15:53   Dg Chest Port 1 View  Result Date: 04/17/2018 CLINICAL DATA:  Increased acute respiratory failure, history of atrial fibrillation and diabetes EXAM: PORTABLE CHEST 1 VIEW COMPARISON:  Portable chest x-ray of 04/14/2018 FINDINGS: The tip of the endotracheal tube is approximately 5.2 cm above the carina. Opacity remains at the right lung base most consistent with atelectasis, but there is more opacity now present at the left lung base. Considerations are that of atelectasis versus pneumonia. No  definite effusion is seen. Cardiomegaly is stable. IMPRESSION: 1. Tip of endotracheal tube 5.2 cm above the carina. 2. Diminished aeration with increasing basilar opacities most consistent with atelectasis. Pneumonia cannot be excluded. 3. Stable cardiomegaly. Electronically Signed   By: Dwyane Dee M.D.   On: 04/17/2018 09:10   [place] Ekg Site Rite  Result Date: 04/17/2018 If Site Rite image not attached, placement could not be confirmed due to current cardiac rhythm.   EKG:   Orders placed or performed during the hospital encounter of 04/07/18  . EKG 12-Lead  . EKG 12-Lead  . EKG 12-Lead  . EKG 12-Lead    ASSESSMENT AND PLAN:   47 year old male with past medical history significant for A. fib status post ablation, aortic valve replacement on warfarin, history of congestive heart failure, CKD, morbid obesity, diabetes and sleep apnea who was getting an outpatient MRI of his lower back had a cardiac arrest.  1.  Cardiac arrest-from respiratory causes. -Patient has acute hypoxic respiratory failure-currently remains on ventilator.  Still requiring 50% FiO2 and 10 of PEEP -Sputum cultures positive for MRSA.  Currently on vancomycin. -Continue management per ICU team -ENT consultation for trach placement today  2.  Atrial fibrillation-tachycardic. -On oral amiodarone via tube -Receiving heparin drip for anticoagulation.  Also has a history of mechanical aortic valve replacement.  3.  Altered mental status-likely metabolic encephalopathy. -No anoxic changes.  Appreciate neurology consult -Keppra was started for possible partial seizures  4.  Diabetes mellitus-currently on insulin drip while in the ICU  5.  Acute renal failure on CKD secondary to ATN.  Creatinine is stable around 2. -Continue to monitor urine output  6.  Septic shock-improved. off Neo-Synephrine drip -Sputum cultures with MRSA.  Currently on vancomycin.  Afebrile.  7.  DVT prophylaxis-on heparin drip at this  time  8. Multilobar Pneumonia On IV Vancomycin antibiotic Consider adding IV Aztreonam and IV Levaquin antibiotics   All the records are reviewed and case discussed with Care Management/Social Workerr. Management plans discussed with the patient, family and they are in agreement.  CODE STATUS: Full code  TOTAL TIME TAKING CARE OF THIS PATIENT: 33 minutes.   POSSIBLE D/C IN 5 to 7 DAYS, DEPENDING ON CLINICAL CONDITION.   Ihor AustinPavan Dajane Valli M.D on 04/18/2018 at 1:18 PM  Between 7am to 6pm - Pager - [phone]  After 6pm go to [email or web address] - Social research officer, governmentpassword EPAS [facility]  [facility] [place] [facility]  Office  [phone]  CC: Primary care physician; Erasmo DownerBacigalupo, Angela M, MD

## 2018-04-18 NOTE — Progress Notes (Signed)
Inpatient Diabetes Program Recommendations  AACE/ADA: New Consensus Statement on Inpatient Glycemic Control (2015)  Target Ranges:  Prepandial:   less than 140 mg/dL      Peak postprandial:   less than 180 mg/dL (1-2 hours)      Critically ill patients:  140 - 180 mg/dL   Results for Alexander RiffleWILSON, Alexander Alexander (MRN [id number]) as of 04/18/2018 07:24  Ref. Range 04/18/2018 00:01 04/18/2018 01:04 04/18/2018 02:08 04/18/2018 03:11 04/18/2018 04:14 04/18/2018 05:08 04/18/2018 06:04 04/18/2018 06:57  Glucose-Capillary Latest Ref Range: 65 - 99 mg/dL 784165 (H) 696163 (H) 295152 (H) 148 (H) 158 (H) 182 (H) 206 (H) 160 (H)   HomeDM Meds: Levemir 65unitsBID  Novolog 40 unitsTID  Amaryl 2 mg daily  CurrentOrders:IV Insulin Drip      Lantus 40 units BID     Remains Intubated and is currently receiving Glucerna Tube Feedings at 20cc/hr.  Note that MD added Lantus 40 units BID yesterday afternoon to be given with the IV Insulin drip.  CBGs are stable at present, however, Insulin Drip rates are >4 units/hr and average Insulin drip rate since Midnight is 12.58 units/hour. 12am- 12.1 units/hr 1am- 11.8 units/hr 2am- 10.6 units/hr 3am- 10.1 units/hr 4am- 11.3 units/hr 5am- 14 units/hr 6am- 18.3 units/hr 7am- 12.5 units/hr  Per the ICU Glycemic Control Protocol Transition guidelines, it is not recommended to transition off the IV Insulin drip until Insulin Drip rates are 4 units/hour or less.May want to leave on the IV Insulin drip until patient Extubated or until Insulin Drip rates are closer to 4 units/hour or less.  If you decide to transition to SQ Insulin, patient will likely require an extremely high dose of both basal insulin and bolus insulin.  If, however, a decision is made to try to attempt transition, recommend using Custom Transition orders based on how much insulin patient takes at home.  Could try the following: Levemir 65 units BID  (home dose of Levemir) Novolog Resistant SSI Q4 hours per the ICU Protocol Transition orders Novolog Tube Feed Coverage 6 units Q4 hours (Do Not order tube feed coverage if patient is Extubated or Tube feeds discontinued at time of transition)     --Will follow patient during hospitalization--  Ambrose FinlandJeannine Johnston Dhana Totton RN, MSN, CDE Diabetes Coordinator Inpatient Glycemic Control Team Team Pager: [phone] (8a-5p)

## 2018-04-18 NOTE — Progress Notes (Signed)
Pharmacy Antibiotic Note  Alexander RiffleOtis Leon Alexander is a 47 y.o. male admitted on 04/07/2018 s/p cardiac arrest started on antibiotics for MRSA Pneumonia Pharmacy has been consulted for Vancomycin dosing.   6/11 Pharmacy consulted for cefepime dosing for HCAP  Plan: Goal trough of 15-20  06/10 @ 0500 VR 17. Will give a vanc 1.25g IV x 1 and recheck VR w/ am labs. If renal function improves will place patient on a regimen.   6/11 AM vanc level 16. Will start on vancomycin 1250 mg  Daily. Level before 3rd dose to confirm clearance. SCr with tomorrow AM labs.  6/11 Start Cefepime 2g IV every 12 hours.   Height: 5\' 10"  (177.8 cm)(based on previous notes) Weight: (!) 414 lb (187.8 kg) IBW/kg (Calculated) : 73  Temp (24hrs), Avg:100 F (37.8 C), Min:98.7 F (37.1 C), Max:100.8 F (38.2 C)  Recent Labs  Lab 04/14/18 0103 04/14/18 0501 04/15/18 0421 04/16/18 0430 04/16/18 1415 04/17/18 0404 04/17/18 1033 04/18/18 0328 04/18/18 0429  WBC  --  15.5* 14.6* 14.1*  --   --  13.5*  --  13.2*  CREATININE  --  1.89* 2.38* 2.26*  --  2.24*  --   --  2.21*  VANCOTROUGH 16  --   --   --  32*  --   --   --   --   VANCORANDOM  --   --   --   --   --  17  --  16  --     Estimated Creatinine Clearance: 68.7 mL/min (A) (by C-G formula based on SCr of 2.21 mg/dL (H)).    Allergies  Allergen Reactions  . Lexapro [Escitalopram Oxalate] Swelling    Antimicrobials this admission: Zosyn 5/31 >> 6/3 Vancomycin 6/2 >> Unasyn 6/3 x 1.  Cefepime 6/11   Dose adjustments this admission: 6/3 Vancomycin transitioned to 1250mg  IV Q12hr.  6/4 Vancomycin transitioned to 1250mg  IV Q18hr.  6/7 Vancomycin transitioned to 1000mg  IV Q12hr. 6/11 Started vancomycin 1250 mg q 24 hours.   Microbiology results: 5/31 MRSA PCR: negative 6/1 BCx: no growth 6/1 UCx: no growth  6/1 Resp Cx/Trach Aspirate: MRSA  6/5 BCx: no growth x2 days 6/5 UCx: no growth   6/5 SputumCx: few staph aureus 6/5 CDiff:  negative  Thank you for allowing pharmacy to be a part of this patient's care.  Gardner CandleSheema M Caylin Raby, PharmD, BCPS Clinical Pharmacist 04/18/2018 1:23 PM

## 2018-04-19 ENCOUNTER — Ambulatory Visit: Payer: Medicare HMO | Admitting: Psychiatry

## 2018-04-19 ENCOUNTER — Inpatient Hospital Stay: Payer: Medicare HMO

## 2018-04-19 DIAGNOSIS — R001 Bradycardia, unspecified: Secondary | ICD-10-CM

## 2018-04-19 DIAGNOSIS — N179 Acute kidney failure, unspecified: Secondary | ICD-10-CM

## 2018-04-19 DIAGNOSIS — J15212 Pneumonia due to Methicillin resistant Staphylococcus aureus: Secondary | ICD-10-CM

## 2018-04-19 DIAGNOSIS — I481 Persistent atrial fibrillation: Secondary | ICD-10-CM

## 2018-04-19 LAB — CBC WITH DIFFERENTIAL/PLATELET
Basophils Absolute: 0.1 10*3/uL (ref 0–0.1)
Basophils Relative: 0 %
EOS PCT: 0 %
Eosinophils Absolute: 0.1 10*3/uL (ref 0–0.7)
HCT: 35.7 % — ABNORMAL LOW (ref 40.0–52.0)
Hemoglobin: 11.5 g/dL — ABNORMAL LOW (ref 13.0–18.0)
LYMPHS ABS: 2 10*3/uL (ref 1.0–3.6)
LYMPHS PCT: 11 %
MCH: 27.7 pg (ref 26.0–34.0)
MCHC: 32.3 g/dL (ref 32.0–36.0)
MCV: 85.8 fL (ref 80.0–100.0)
MONOS PCT: 14 %
Monocytes Absolute: 2.5 10*3/uL — ABNORMAL HIGH (ref 0.2–1.0)
Neutro Abs: 13.7 10*3/uL — ABNORMAL HIGH (ref 1.4–6.5)
Neutrophils Relative %: 75 %
PLATELETS: 179 10*3/uL (ref 150–440)
RBC: 4.16 MIL/uL — AB (ref 4.40–5.90)
RDW: 17.8 % — ABNORMAL HIGH (ref 11.5–14.5)
WBC: 18.4 10*3/uL — AB (ref 3.8–10.6)
nRBC: 4 /100 WBC — ABNORMAL HIGH

## 2018-04-19 LAB — CBC
HCT: 37.7 % — ABNORMAL LOW (ref 40.0–52.0)
Hemoglobin: 12.2 g/dL — ABNORMAL LOW (ref 13.0–18.0)
MCH: 27.8 pg (ref 26.0–34.0)
MCHC: 32.3 g/dL (ref 32.0–36.0)
MCV: 86.2 fL (ref 80.0–100.0)
Platelets: 195 10*3/uL (ref 150–440)
RBC: 4.38 MIL/uL — ABNORMAL LOW (ref 4.40–5.90)
RDW: 18.2 % — ABNORMAL HIGH (ref 11.5–14.5)
WBC: 17.4 10*3/uL — ABNORMAL HIGH (ref 3.8–10.6)

## 2018-04-19 LAB — GLUCOSE, CAPILLARY
GLUCOSE-CAPILLARY: 168 mg/dL — AB (ref 65–99)
GLUCOSE-CAPILLARY: 140 mg/dL — AB (ref 65–99)
GLUCOSE-CAPILLARY: 194 mg/dL — AB (ref 65–99)
Glucose-Capillary: 132 mg/dL — ABNORMAL HIGH (ref 65–99)
Glucose-Capillary: 143 mg/dL — ABNORMAL HIGH (ref 65–99)
Glucose-Capillary: 159 mg/dL — ABNORMAL HIGH (ref 65–99)
Glucose-Capillary: 161 mg/dL — ABNORMAL HIGH (ref 65–99)
Glucose-Capillary: 161 mg/dL — ABNORMAL HIGH (ref 65–99)
Glucose-Capillary: 165 mg/dL — ABNORMAL HIGH (ref 65–99)
Glucose-Capillary: 173 mg/dL — ABNORMAL HIGH (ref 65–99)
Glucose-Capillary: 183 mg/dL — ABNORMAL HIGH (ref 65–99)
Glucose-Capillary: 204 mg/dL — ABNORMAL HIGH (ref 65–99)
Glucose-Capillary: 215 mg/dL — ABNORMAL HIGH (ref 65–99)
Glucose-Capillary: 261 mg/dL — ABNORMAL HIGH (ref 65–99)

## 2018-04-19 LAB — HEPARIN LEVEL (UNFRACTIONATED)
HEPARIN UNFRACTIONATED: 0.43 [IU]/mL (ref 0.30–0.70)
Heparin Unfractionated: 1.27 IU/mL — ABNORMAL HIGH (ref 0.30–0.70)
Heparin Unfractionated: 1.95 [IU]/mL — ABNORMAL HIGH (ref 0.30–0.70)

## 2018-04-19 LAB — C DIFFICILE QUICK SCREEN W PCR REFLEX
C DIFFICILE (CDIFF) INTERP: NOT DETECTED
C Diff antigen: NEGATIVE
C Diff toxin: NEGATIVE

## 2018-04-19 LAB — BASIC METABOLIC PANEL
Anion gap: 12 (ref 5–15)
BUN: 89 mg/dL — AB (ref 6–20)
CHLORIDE: 104 mmol/L (ref 101–111)
CO2: 27 mmol/L (ref 22–32)
Calcium: 8 mg/dL — ABNORMAL LOW (ref 8.9–10.3)
Creatinine, Ser: 3.29 mg/dL — ABNORMAL HIGH (ref 0.61–1.24)
GFR calc Af Amer: 24 mL/min — ABNORMAL LOW (ref >60)
GFR, EST NON AFRICAN AMERICAN: 21 mL/min — AB (ref >60)
GLUCOSE: 268 mg/dL — AB (ref 65–99)
Potassium: 4.3 mmol/L (ref 3.5–5.1)
Sodium: 143 mmol/L (ref 135–145)

## 2018-04-19 LAB — PROCALCITONIN: PROCALCITONIN: 42.96 ng/mL

## 2018-04-19 LAB — TRIGLYCERIDES: TRIGLYCERIDES: 133 mg/dL (ref <150)

## 2018-04-19 LAB — PATHOLOGIST SMEAR REVIEW

## 2018-04-19 MED ORDER — LINEZOLID 600 MG/300ML IV SOLN
600.0000 mg | Freq: Two times a day (BID) | INTRAVENOUS | Status: DC
  Administered 2018-04-19 – 2018-04-27 (×17): 600 mg via INTRAVENOUS
  Filled 2018-04-19 (×23): qty 300

## 2018-04-19 MED ORDER — IPRATROPIUM-ALBUTEROL 0.5-2.5 (3) MG/3ML IN SOLN
3.0000 mL | Freq: Four times a day (QID) | RESPIRATORY_TRACT | Status: DC | PRN
  Administered 2018-04-19: 3 mL via RESPIRATORY_TRACT
  Filled 2018-04-19: qty 3

## 2018-04-19 MED ORDER — METOPROLOL TARTRATE 25 MG PO TABS
25.0000 mg | ORAL_TABLET | Freq: Three times a day (TID) | ORAL | Status: DC
Start: 2018-04-19 — End: 2018-04-20
  Administered 2018-04-19 – 2018-04-20 (×3): 25 mg
  Filled 2018-04-19 (×3): qty 1

## 2018-04-19 MED ORDER — INSULIN ASPART 100 UNIT/ML ~~LOC~~ SOLN
4.0000 [IU] | SUBCUTANEOUS | Status: DC
  Administered 2018-04-19 – 2018-04-20 (×10): 4 [IU] via SUBCUTANEOUS
  Filled 2018-04-19 (×9): qty 1

## 2018-04-19 MED ORDER — HEPARIN (PORCINE) IN NACL 100-0.45 UNIT/ML-% IJ SOLN
2300.0000 [IU]/h | INTRAMUSCULAR | Status: AC
  Administered 2018-04-19 – 2018-04-20 (×2): 1800 [IU]/h via INTRAVENOUS
  Administered 2018-04-20 – 2018-04-21 (×2): 1900 [IU]/h via INTRAVENOUS
  Administered 2018-04-21 – 2018-04-24 (×7): 2300 [IU]/h via INTRAVENOUS
  Filled 2018-04-19 (×9): qty 250

## 2018-04-19 MED ORDER — INSULIN ASPART 100 UNIT/ML ~~LOC~~ SOLN
0.0000 [IU] | SUBCUTANEOUS | Status: DC
  Administered 2018-04-19 (×2): 4 [IU] via SUBCUTANEOUS
  Administered 2018-04-19: 3 [IU] via SUBCUTANEOUS
  Administered 2018-04-19: 7 [IU] via SUBCUTANEOUS
  Administered 2018-04-19 – 2018-04-20 (×3): 4 [IU] via SUBCUTANEOUS
  Administered 2018-04-20: 7 [IU] via SUBCUTANEOUS
  Administered 2018-04-20: 4 [IU] via SUBCUTANEOUS
  Administered 2018-04-20: 3 [IU] via SUBCUTANEOUS
  Administered 2018-04-21: 7 [IU] via SUBCUTANEOUS
  Administered 2018-04-21: 11 [IU] via SUBCUTANEOUS
  Administered 2018-04-21 (×3): 7 [IU] via SUBCUTANEOUS
  Administered 2018-04-21: 11 [IU] via SUBCUTANEOUS
  Administered 2018-04-22: 7 [IU] via SUBCUTANEOUS
  Administered 2018-04-22 (×2): 4 [IU] via SUBCUTANEOUS
  Administered 2018-04-22: 7 [IU] via SUBCUTANEOUS
  Administered 2018-04-22 – 2018-04-23 (×4): 4 [IU] via SUBCUTANEOUS
  Administered 2018-04-23: 7 [IU] via SUBCUTANEOUS
  Administered 2018-04-23: 4 [IU] via SUBCUTANEOUS
  Administered 2018-04-23: 7 [IU] via SUBCUTANEOUS
  Administered 2018-04-23: 4 [IU] via SUBCUTANEOUS
  Administered 2018-04-24: 3 [IU] via SUBCUTANEOUS
  Administered 2018-04-24 (×2): 4 [IU] via SUBCUTANEOUS
  Filled 2018-04-19 (×31): qty 1

## 2018-04-19 MED ORDER — MIDAZOLAM HCL 2 MG/2ML IJ SOLN
1.0000 mg | INTRAMUSCULAR | Status: DC | PRN
  Administered 2018-04-19 – 2018-04-25 (×5): 2 mg via INTRAVENOUS
  Administered 2018-04-25: 5 mg via INTRAVENOUS
  Filled 2018-04-19 (×5): qty 2

## 2018-04-19 MED ORDER — HEPARIN (PORCINE) IN NACL 100-0.45 UNIT/ML-% IJ SOLN
1800.0000 [IU]/h | INTRAMUSCULAR | Status: DC
  Administered 2018-04-19 (×2): 2000 [IU]/h via INTRAVENOUS
  Filled 2018-04-19: qty 250

## 2018-04-19 NOTE — Progress Notes (Signed)
[name] for heparin drip management Indication: atrial fibrillation and aortic valve replacement   Allergies  Allergen Reactions  . Lexapro [Escitalopram Oxalate] Swelling    Patient Measurements: Height: 5' 10"  (177.8 cm)(based on previous notes) Weight: (!) 416 lb (188.7 kg) IBW/kg (Calculated) : 73 Heparin Dosing Weight: 111kg  Vital Signs: Temp: 99.8 F (37.7 C) (06/12 1300) Temp Source: Oral (06/12 1300) BP: 123/90 (06/12 1300) Pulse Rate: 115 (06/12 1300)  Labs: Recent Labs    04/18/18 0429 04/18/18 1552 04/18/18 1747 04/18/18 1931 04/19/18 0433 04/19/18 1403  HGB 12.8* 11.5*  --  11.5* 12.2*  --   HCT 38.3* 37.7*  --  35.7* 37.7*  --   PLT 201 182  --  179 195  --   APTT  --  83*  --   --   --   --   LABPROT  --  16.8*  --   --   --   --   INR  --  1.37  --   --   --   --   HEPARINUNFRC 0.33  --   --   --  1.27* 0.43  CREATININE 2.21*  --  2.86*  --  3.29*  --   TROPONINI  --   --   --  0.11*  --   --     Estimated Creatinine Clearance: 46.3 mL/min (A) (by C-G formula based on SCr of 3.29 mg/dL (H)).   Medical History: Past Medical History:  Diagnosis Date  . Allergy   . Anxiety   . Arrhythmia   . Arthritis   . Atrial fibrillation ([place])   . CHF (congestive heart failure) ([place])   . CKD (chronic kidney disease)   . Diabetes mellitus without complication ([place])   . Diabetes mellitus, type II ([place])   . Heart failure ([place])   . Hyperlipidemia   . Hypertension   . Pancreatitis   . Sleep apnea     Medications:  Scheduled:  . chlorhexidine gluconate (MEDLINE KIT)  15 mL Mouth Rinse BID  . EPINEPHrine  1 mg Intravenous Once  . famotidine  20 mg Per Tube Daily  . feeding supplement (PRO-STAT SUGAR FREE 64)  60 mL Per Tube TID  . free water  200 mL Per Tube Q8H  . insulin aspart  0-20 Units Subcutaneous Q4H  . insulin aspart  4 Units Subcutaneous Q4H  . insulin glargine  80 Units Subcutaneous BID  . mouth  rinse  15 mL Mouth Rinse 10 times per day  . metoprolol tartrate  25 mg Per Tube Q8H  . multivitamin  15 mL Per Tube Daily  . sodium chloride flush  10-40 mL Intracatheter Q12H   Infusions:  . ceFEPime (MAXIPIME) IV Stopped (04/19/18 1134)  . feeding supplement (VITAL HIGH PROTEIN) Stopped (04/18/18 1601)  . fentaNYL infusion INTRAVENOUS 250 mcg/hr (04/19/18 1329)  . heparin 2,000 Units/hr (04/19/18 1214)  . linezolid (ZYVOX) IV Stopped (04/19/18 1116)    Assessment: Pharmacy consulted for warfarin and heparin drip management for 47 yo male admitted to the ICU s/p cardiac arrest. Patient has history significant for aortic valve replacement, atrial fibrillation and CHF. Head CT is negative for infarction or hemorrhage.   Patient takes warfarin 79m daily as an outpatient for goal INR 2.5-3.5.    Goal of Therapy:  INR 2-3 Heparin level 0.3-0.7 units/ml Monitor platelets by anticoagulation protocol: Yes   Plan:  Heparin level is at goal.  Will continue heparin infusion at 2000 units/hr and recheck a HL in  6 hours.   Napoleon Form, PharmD, BCPS Clinical Pharmacist 04/19/2018 3:11 PM

## 2018-04-19 NOTE — Care Management (Signed)
[facility] updated of patient status. Trach planned.

## 2018-04-19 NOTE — Progress Notes (Addendum)
[place] for heparin drip management Indication: atrial fibrillation and aortic valve replacement   Allergies  Allergen Reactions  . Lexapro [Escitalopram Oxalate] Swelling    Patient Measurements: Height: _0  (177.8 cm)(based on previous notes) Weight: (!) 416 lb (188.7 kg) IBW/kg (Calculated) : 73 Heparin Dosing Weight: 111kg  Vital Signs: Temp: 100.7 F (38.2 C) (06/12 2000) Temp Source: Oral (06/12 2000) BP: 104/72 (06/12 2100) Pulse Rate: 121 (06/12 2100)  Labs: Recent Labs    04/18/18 0429 04/18/18 1552 04/18/18 1747 04/18/18 1931 04/19/18 0433 04/19/18 1403 04/19/18 2004  HGB 12.8* 11.5*  --  11.5* 12.2*  --   --   HCT 38.3* 37.7*  --  35.7* 37.7*  --   --   PLT 201 182  --  179 195  --   --   APTT  --  83*  --   --   --   --   --   LABPROT  --  16.8*  --   --   --   --   --   INR  --  1.37  --   --   --   --   --   HEPARINUNFRC 0.33  --   --   --  1.27* 0.43 1.95*  CREATININE 2.21*  --  2.86*  --  3.29*  --   --   TROPONINI  --   --   --  0.11*  --   --   --     Estimated Creatinine Clearance: 46.3 mL/min (A) (by C-G formula based on SCr of 3.29 mg/dL (H)).   Medical History: Past Medical History:  Diagnosis Date  . Allergy   . Anxiety   . Arrhythmia   . Arthritis   . Atrial fibrillation ([place])   . CHF (congestive heart failure) ([place])   . CKD (chronic kidney disease)   . Diabetes mellitus without complication ([place])   . Diabetes mellitus, type II ([place])   . Heart failure ([place])   . Hyperlipidemia   . Hypertension   . Pancreatitis   . Sleep apnea     Medications:  Scheduled:  . chlorhexidine gluconate (MEDLINE KIT)  15 mL Mouth Rinse BID  . EPINEPHrine  1 mg Intravenous Once  . famotidine  20 mg Per Tube Daily  . feeding supplement (PRO-STAT SUGAR FREE 64)  60 mL Per Tube TID  . free water  200 mL Per Tube Q8H  . insulin aspart  0-20 Units Subcutaneous Q4H  . insulin aspart  4 Units Subcutaneous Q4H   . insulin glargine  80 Units Subcutaneous BID  . mouth rinse  15 mL Mouth Rinse 10 times per day  . metoprolol tartrate  25 mg Per Tube Q8H  . multivitamin  15 mL Per Tube Daily  . sodium chloride flush  10-40 mL Intracatheter Q12H   Infusions:  . ceFEPime (MAXIPIME) IV 2 g (04/19/18 2152)  . feeding supplement (VITAL HIGH PROTEIN) Stopped (04/18/18 1601)  . fentaNYL infusion INTRAVENOUS 250 mcg/hr (04/19/18 2158)  . heparin    . linezolid (ZYVOX) IV 600 mg (04/19/18 2147)    Assessment: Pharmacy consulted for warfarin and heparin drip management for 47 yo male admitted to the ICU s/p cardiac arrest. Patient has history significant for aortic valve replacement, atrial fibrillation and CHF. Head CT is negative for infarction or hemorrhage.   Patient takes warfarin 75m daily as an outpatient for goal INR 2.5-3.5.  Goal of Therapy:  INR 2-3 Heparin level 0.3-0.7 units/ml Monitor platelets by anticoagulation protocol: Yes   Plan:  Heparin level is at goal. Will continue heparin infusion at 2000 units/hr and recheck a HL in  6 hours.   04/19/2018 20:04 HL supratherapeutic x 1. Spoke with RN - please hold x 1 hour then resume at 1800 units/hr. Recheck HL 6 hours after resuming infusion.  04/19/2018 22:56 RN called back to tell us patient is a line draw which may have affected the heparin level. She also says she held heparin infusion at least 5 minutes before draw and flushed the line per protocol. We agreed that recheck now would be artificially low since infusion has been on hold, but will get peripheral stick for AM lab. She'll follow up with lab to let them know peripheral needed for 05:00.   04/20/2018 05:55 HL subtherapeutic x 1. 1600 units IV bolus x 1 and increase rate to 1900 units/hr. Will recheck HL 6 hours after increase.  Laural Benes, PharmD, BCPS Clinical Pharmacist 04/19/2018 10:07 PM

## 2018-04-19 NOTE — Progress Notes (Signed)
Around 0130 pt became more awake raising hands above head and up towards face / ETT. Fentanyl bolus given, 2mg  Ativan given with eventual relief. Pt began to have bilateral wheezing as he became more awake, NP informed, order placed for Duoneb, administered by RT. Upon later ascultation lung sounds clear/diminished. Wife still at bedside, informed of all care provided. Will continue to monitor.

## 2018-04-19 NOTE — Progress Notes (Signed)
PULMONARY / CRITICAL CARE MEDICINE   Name: Alexander Alexander Deshotels MRN: [id number] DOB: Mar 12, 1971    ADMISSION DATE:  04/07/2018  PT PROFILE:   Alexander Alexander Twardowski is an 47 y.o. male. 47 year old gentleman with past medical history of atrial fibrillation, status post ablation and aortic valve replacement on warfarin, history of congestive heart failure, chronic renal disease, morbid obesity, diabetes, hyperlipidemia, obstructive sleep apnea and hypertension, suffered a pulseless VT cardiac arrest in MRI when he was being evaluated for back pain and sciatica, subsequently intubated in the intensive care unit.  EVENTS/RESULTS: 06/11 CT chest: Dense RLL consolidation.  Segmental RML and LLL atelectasis.  Patchy GGO in upper lobes bilaterally 06/12 rough night with desaturation.  Transient bradycardia.    INDWELLING DEVICES:: R IJ CVL 05/31 >> 06/10 ETT 05/31 >>  RUE PICC 06/10 >>    MICRO DATA: MRSA PCR 05/31 >> NEG Urine 06/01 >> NEG Resp 06/01 >> MRSA Blood 06/01 >> NEG C diff 06/04 >> NEG  Urine 06/05 >> NEG Resp 06/05 >> MRSA Blood 06/05 >> NEG Resp 06/11 >>   ANTIMICROBIALS:  Pip-tazo 05/31 >> 06/03 Vanc 05/31 >> 6/12 Cefepime 06/11 >>  Linezolid 06/12 >>   SUBJECTIVE:  RASS -1, + F/C. No SBT performed  VITAL SIGNS: BP (!) 144/96   Pulse (!) 133   Temp 98.6 F (37 C) (Oral)   Resp (!) 24   Ht 5\' 10"  (1.778 m) Comment: based on previous notes  Wt (!) 416 lb (188.7 kg)   SpO2 98%   BMI 59.69 kg/m   HEMODYNAMICS:    VENTILATOR SETTINGS: Vent Mode: PRVC FiO2 (%):  [60 %-100 %] 60 % Set Rate:  [30 bmp] 30 bmp Vt Set:  [400 mL] 400 mL PEEP:  [12 cmH20-14 cmH20] 12 cmH20 Plateau Pressure:  [22 cmH20-28 cmH20] 26 cmH20  INTAKE / OUTPUT: I/O last 3 completed shifts: In: 5482.3 [I.V.:2511.6; NG/GT:1590; IV Piggyback:1380.7] Out: 5950 [Urine:5670; Emesis/NG output:100; Other:80; Stool:100]  PHYSICAL EXAMINATION: General: Intubated, sedated, + F/C Neuro: Cranial  nerves intact, no focal deficits HEENT: NCAT, sclerae white Cardiovascular: IRIR, tachycardia, no M Lungs: Scattered rhonchi, no wheezes Abdomen: distended, soft, diminished BS Ext: symmetric LE edema Skin: No lesions noted  LABS:  BMET Recent Labs  Lab 04/18/18 0429 04/18/18 1747 04/19/18 0433  NA 139 139 143  K 3.8 4.2 4.3  CL 101 99* 104  CO2 28 26 27   BUN 67* 78* 89*  CREATININE 2.21* 2.86* 3.29*  GLUCOSE 138* 279* 268*    Electrolytes Recent Labs  Lab 04/16/18 1140 04/17/18 0404 04/18/18 0429 04/18/18 1747 04/19/18 0433  CALCIUM  --  8.6* 8.1* 7.9* 8.0*  MG 2.5* 2.5*  --  2.3  --   PHOS  --  3.4  --  5.3*  --     CBC Recent Labs  Lab 04/18/18 0429 04/18/18 1552 04/18/18 1931 04/19/18 0433  WBC 13.2*  --  18.4* 17.4*  HGB 12.8* 11.5* 11.5* 12.2*  HCT 38.3* 37.7* 35.7* 37.7*  PLT 201 182 179 195    Coag's Recent Labs  Lab 04/13/18 0424 04/14/18 0501 04/18/18 1552  APTT  --   --  83*  INR 1.09 1.23 1.37    Sepsis Markers Recent Labs  Lab 04/18/18 0921 04/19/18 0433  PROCALCITON 1.54 42.96    ABG Recent Labs  Lab 04/18/18 0555 04/18/18 1549 04/18/18 1952  PHART 7.38 7.21* 7.32*  PCO2ART 60* 61* 63*  PO2ART 54* 60* 72*  Liver Enzymes Recent Labs  Lab 04/18/18 0429  AST 25  ALT 21  ALKPHOS 68  BILITOT 1.4*  ALBUMIN 2.6*    Cardiac Enzymes Recent Labs  Lab 04/18/18 1931  TROPONINI 0.11*    Glucose Recent Labs  Lab 04/19/18 0440 04/19/18 0442 04/19/18 0542 04/19/18 0646 04/19/18 0801 04/19/18 0857  GLUCAP 261* 173* 159* 140* 143* 132*    CXR: No significant change   ASSESSMENT / PLAN:  PULMONARY A: Respiratory arrest 05/31 Prolonged ventilator dependence Persistently high FiO2/PEEP requirements P:   Cont vent support - settings reviewed and/or adjusted Cont vent bundle Daily SBT if/when meets criteria ENT consultation requested 6/10 for tracheostomy tube placement  CARDIOVASCULAR A:  VT  cardiac arrest 5/31 (felt secondary to respiratory rest) Chronic atrial fibrillation Transient bradycardia P:  MAP goal > 65 mmHg Continue heparin infusion Continue enteral metoprolol initiated 6/11-dose reduced 6/12 Discontinue amiodarone infusion  RENAL A:   CKD, baseline creatinine 2.0-2.5 Worsening AKI Hypervolemia P:   Monitor BMET intermittently Monitor I/Os Correct electrolytes as indicated  No further diuresis today  GASTROINTESTINAL A:   Abdominal distention TF intolerance last night P:   SUP: Enteral famotidine Resume TF protocol  HEMATOLOGIC A:   Very mild ICU acquired anemia P:  DVT px: heparin infusion (A. fib) Monitor CBC intermittently Transfuse per usual guidelines   INFECTIOUS A:   MRSA pneumonia Persistent fevers Severe sepsis P:   Monitor temp, WBC count Micro and abx as above   ENDOCRINE A:   DM 2 with severe insulin resistance P:   Continue Lantus initiated 6/11, dose increased 6/12 Will try to transition off insulin infusion today  NEUROLOGIC A:   Acute encephalopathy, improved P:   RASS goal: 1, -2 Continue PAD protocol -fentanyl Will follow discontinued 6/12   FAMILY  Wife updated at bedside   CCM time: 40 mins The above time includes time spent in consultation with patient and/or family members and reviewing care plan on multidisciplinary rounds  Billy Fischer, MD PCCM service Mobile [phone] Pager [phone] 04/19/2018, 11:41 AM

## 2018-04-19 NOTE — Progress Notes (Signed)
Pharmacy Antibiotic Note  Alexander Alexander is a 47 y.o. male admitted on 04/07/2018 s/p cardiac arrest started on antibiotics for MRSA Pneumonia Pharmacy has been consulted for Vancomycin dosing.   6/11 Pharmacy consulted for cefepime dosing for HCAP  6/12 Vancomycin discontinued. Linezolid started.   Plan: Continue  Cefepime 2g IV every 12 hours.   Height: 5\' 10"  (177.8 cm)(based on previous notes) Weight: (!) 416 lb (188.7 kg) IBW/kg (Calculated) : 73  Temp (24hrs), Avg:100.4 F (38 C), Min:98.5 F (36.9 C), Max:102.9 F (39.4 C)  Recent Labs  Lab 04/14/18 0103  04/16/18 0430 04/16/18 1415 04/17/18 0404 04/17/18 1033 04/18/18 0328 04/18/18 0429 04/18/18 1747 04/18/18 1931 04/19/18 0433  WBC  --    < > 14.1*  --   --  13.5*  --  13.2*  --  18.4* 17.4*  CREATININE  --    < > 2.26*  --  2.24*  --   --  2.21* 2.86*  --  3.29*  VANCOTROUGH 16  --   --  32*  --   --   --   --   --   --   --   VANCORANDOM  --   --   --   --  17  --  16  --   --   --   --    < > = values in this interval not displayed.    Estimated Creatinine Clearance: 46.3 mL/min (A) (by C-G formula based on SCr of 3.29 mg/dL (H)).    Allergies  Allergen Reactions  . Lexapro [Escitalopram Oxalate] Swelling    Antimicrobials this admission: Zosyn 5/31 >> 6/3 Vancomycin 6/2 >>6/12 Unasyn 6/3 x 1.  Cefepime 6/11 >> Linezolid 6/12>>  Dose adjustments this admission: 6/3 Vancomycin transitioned to 1250mg  IV Q12hr.  6/4 Vancomycin transitioned to 1250mg  IV Q18hr.  6/7 Vancomycin transitioned to 1000mg  IV Q12hr. 6/11 Started vancomycin 1250 mg q 24 hours.   Microbiology results: 5/31 MRSA PCR: negative 6/1 BCx: no growth 6/1 UCx: no growth  6/1 Resp Cx/Trach Aspirate: MRSA  6/5 BCx: no growth x2 days 6/5 UCx: no growth   6/5 SputumCx: few staph aureus 6/5 CDiff: negative  Thank you for allowing pharmacy to be a part of this patient's care.  Alexander Alexander, PharmD, BCPS Clinical  Pharmacist 04/19/2018 3:25 PM

## 2018-04-19 NOTE — Progress Notes (Signed)
[facility] for heparin drip management Indication: atrial fibrillation and aortic valve replacement   Allergies  Allergen Reactions  . Lexapro [Escitalopram Oxalate] Swelling    Patient Measurements: Height: 5' 10"  (177.8 cm)(based on previous notes) Weight: (!) 414 lb (187.8 kg) IBW/kg (Calculated) : 73 Heparin Dosing Weight: 111kg  Vital Signs: Temp: 99.5 F (37.5 C) (06/12 0400) Temp Source: Oral (06/12 0400) BP: 116/78 (06/12 0400) Pulse Rate: 118 (06/12 0400)  Labs: Recent Labs    04/17/18 1625 04/18/18 0429 04/18/18 1552 04/18/18 1747 04/18/18 1931 04/19/18 0433  HGB  --  12.8* 11.5*  --  11.5* 12.2*  HCT  --  38.3* 37.7*  --  35.7* 37.7*  PLT  --  201 182  --  179 195  APTT  --   --  83*  --   --   --   LABPROT  --   --  16.8*  --   --   --   INR  --   --  1.37  --   --   --   HEPARINUNFRC 0.46 0.33  --   --   --  1.27*  CREATININE  --  2.21*  --  2.86*  --  3.29*  TROPONINI  --   --   --   --  0.11*  --     Estimated Creatinine Clearance: 46.2 mL/min (A) (by C-G formula based on SCr of 3.29 mg/dL (H)).   Medical History: Past Medical History:  Diagnosis Date  . Allergy   . Anxiety   . Arrhythmia   . Arthritis   . Atrial fibrillation ([place])   . CHF (congestive heart failure) ([place])   . CKD (chronic kidney disease)   . Diabetes mellitus without complication ([place])   . Diabetes mellitus, type II ([place])   . Heart failure ([place])   . Hyperlipidemia   . Hypertension   . Pancreatitis   . Sleep apnea     Medications:  Scheduled:  . chlorhexidine gluconate (MEDLINE KIT)  15 mL Mouth Rinse BID  . EPINEPHrine  1 mg Intravenous Once  . famotidine  20 mg Per Tube Daily  . feeding supplement (PRO-STAT SUGAR FREE 64)  60 mL Per Tube TID  . free water  200 mL Per Tube Q8H  . glucagon (human recombinant)  1 mg Intravenous Once  . insulin glargine  80 Units Subcutaneous BID  . mouth rinse  15 mL Mouth Rinse 10 times per  day  . multivitamin  15 mL Per Tube Daily  . sodium chloride flush  10-40 mL Intracatheter Q12H  . sodium chloride flush  10-40 mL Intracatheter Q12H   Infusions:  . amiodarone 30 mg/hr (04/19/18 0400)  . ceFEPime (MAXIPIME) IV Stopped (04/18/18 2215)  . feeding supplement (VITAL HIGH PROTEIN) Stopped (04/18/18 1601)  . fentaNYL infusion INTRAVENOUS 250 mcg/hr (04/19/18 0400)  . heparin    . insulin (NOVOLIN-R) infusion 6.8 Units/hr (04/19/18 0443)  . norepinephrine (LEVOPHED) Adult infusion Stopped (04/18/18 1725)  . phenylephrine (NEO-SYNEPHRINE) Adult infusion    . propofol (DIPRIVAN) infusion Stopped (04/18/18 1601)  . vancomycin Stopped (04/18/18 0947)  . vasopressin (PITRESSIN) infusion - *FOR SHOCK* Stopped (04/18/18 1732)    Assessment: Pharmacy consulted for warfarin and heparin drip management for 47 yo male admitted to the ICU s/p cardiac arrest. Patient has history significant for aortic valve replacement, atrial fibrillation and CHF. Head CT is negative for infarction or hemorrhage. Heparin level  for today continues to be subtherapeutic at 0.28, with a goal of 0.3-0.7 units/mL. Confirmed with RN that heparin drip has not been interrupted. Per protocol, will re-bolus heparin and increase rate of heparin drip. Will obtain heparin level at 2030 and continue to monitor and adjust as needed.  Patient takes warfarin 2m daily as an outpatient for goal INR 2.5-3.5.    Goal of Therapy:  INR 2-3 Heparin level 0.3-0.7 units/ml Monitor platelets by anticoagulation protocol: Yes   Plan:  06/10 @ 0400 HL 0.29 subtherapeutic. Will rebolus w/ 1800 units IV x 1 and will increase rate to 2300 units/hr and will recheck @ 1100. Hgb trending down but stable will continue to monitor.  6/10 @1100  HL 0.43- Level is therapeutic x 1. Will order confirmatory level in 6 hours.   04/17/18 16:25 HL therapeutic x 1. Continue current rate. Will monitor HL/CBC daily per protocol.   6/11 AM heparin  level 0.33, Continue current regimen. Recheck heparin level and CBC with tomorrow AM labs.  6/12 AM heparin level 1.27. Hold drip x 1 hour and restart at 2000 units/hr. Recheck 6 hours after restart.  MEloise Harman PharmD, BCPS Clinical Pharmacist 04/19/2018 5:24 AM

## 2018-04-19 NOTE — Progress Notes (Addendum)
Inpatient Diabetes Program Recommendations  AACE/ADA: New Consensus Statement on Inpatient Glycemic Control (2019)  Target Ranges:  Prepandial:   less than 140 mg/dL      Peak postprandial:   less than 180 mg/dL (1-2 hours)      Critically ill patients:  140 - 180 mg/dL   Results for Alexander Alexander, Alexander Alexander (MRN [id number]) as of 04/19/2018 08:10  Ref. Range 04/19/2018 00:32 04/19/2018 01:36 04/19/2018 02:31 04/19/2018 03:32 04/19/2018 04:40 04/19/2018 04:42 04/19/2018 05:42 04/19/2018 06:46  Glucose-Capillary Latest Ref Range: 65 - 99 mg/dL 409165 (H)  6.3 units/hour 168 (H)  6.5 units/hour 161 (H)  6.1 units/hour 161 (H)  6.1 units/hour 261 (H) 173 (H)  6.8 units/hour 159 (H)  5.9 units/hour 140 (H)  4.8 units/hour   Review of Glycemic Control  Diabetes history: DM2 Outpatient Diabetes medications: Levemir 65 units BID, Novolog 40 units TID with meals, Amaryl 2 mg daily Current orders for Inpatient glycemic control: Lantus 80 units BID, IV insulin drip  Inpatient Diabetes Program Recommendations: Insulin - Basal: Noted Lantus increased to 80 units BID on 04/18/18 and insulin drip continued as well. Please consider increasing Lantus to 85 units BID.  Per the ICU Glycemic Control Protocol Transition guidelines, it is not recommended to transition off the IV Insulin drip until Insulin Drip rates are 4 units/hour or less.May want to leave on the IV Insulin drip until patient Extubatedor until Insulin Drip rates are consistently closer to 4 units/hour or less.  If you decide to transition to SQ Insulin, patient will likely require an extremely high dose of both basal insulin and bolus insulin.  If, however, a decisionismade to try to attempt transition, recommend using Custom Transition orders.  Could try the following: Levemir 85 units BID  Novolog Resistant SSI Q4 hours per the ICU Protocol Transition orders Phase 3 Novolog Tube Feed Coverage 5 units Q4 hours (Do Not order tube feed  coverage if patient is Extubated or Tube feeds discontinued at time of transition)  Thanks, Orlando PennerMarie Mauri Tolen, RN, MSN, CDE Diabetes Coordinator Inpatient Diabetes Program [phone] (Team Pager from 8am to 5pm)

## 2018-04-19 NOTE — Progress Notes (Signed)
[facility] [place] at [facility]   PATIENT NAME: Alexander Alexander    MR#:  [id number]  DATE OF BIRTH:  Jul 22, 1971  SUBJECTIVE:  CHIEF COMPLAINT:   Chief Complaint  Patient presents with  . Back Pain  Patient remains critically ill, patient with ventilator associated pneumonia-started on Lynn nasal lid/cefepime, case discussed with intensivist-plans for trach PEG sometime next week  REVIEW OF SYSTEMS:  CONSTITUTIONAL: No fever, fatigue or weakness.  EYES: No blurred or double vision.  EARS, NOSE, AND THROAT: No tinnitus or ear pain.  RESPIRATORY: No cough, shortness of breath, wheezing or hemoptysis.  CARDIOVASCULAR: No chest pain, orthopnea, edema.  GASTROINTESTINAL: No nausea, vomiting, diarrhea or abdominal pain.  GENITOURINARY: No dysuria, hematuria.  ENDOCRINE: No polyuria, nocturia,  HEMATOLOGY: No anemia, easy bruising or bleeding SKIN: No rash or lesion. MUSCULOSKELETAL: No joint pain or arthritis.   NEUROLOGIC: No tingling, numbness, weakness.  PSYCHIATRY: No anxiety or depression.   ROS  DRUG ALLERGIES:   Allergies  Allergen Reactions  . Lexapro [Escitalopram Oxalate] Swelling    VITALS:  Blood pressure 123/90, pulse (!) 115, temperature 99.8 F (37.7 C), temperature source Oral, resp. rate 19, height 5\' 10"  (1.778 m), weight (!) 188.7 kg (416 lb), SpO2 92 %.  PHYSICAL EXAMINATION:  GENERAL:  47 y.o.-year-old patient lying in the bed with no acute distress.  EYES: Pupils equal, round, reactive to light and accommodation. No scleral icterus. Extraocular muscles intact.  HEENT: Head atraumatic, normocephalic. Oropharynx and nasopharynx clear.  NECK:  Supple, no jugular venous distention. No thyroid enlargement, no tenderness.  LUNGS: Normal breath sounds bilaterally, no wheezing, rales,rhonchi or crepitation. No use of accessory muscles of respiration.  CARDIOVASCULAR: S1, S2 normal. No murmurs, rubs, or gallops.  ABDOMEN: Soft, nontender,  nondistended. Bowel sounds present. No organomegaly or mass.  EXTREMITIES: No pedal edema, cyanosis, or clubbing.  NEUROLOGIC: Cranial nerves II through XII are intact. Muscle strength 5/5 in all extremities. Sensation intact. Gait not checked.  PSYCHIATRIC: The patient is alert and oriented x 3.  SKIN: No obvious rash, lesion, or ulcer.   Physical Exam LABORATORY PANEL:   CBC Recent Labs  Lab 04/19/18 0433  WBC 17.4*  HGB 12.2*  HCT 37.7*  PLT 195   ------------------------------------------------------------------------------------------------------------------  Chemistries  Recent Labs  Lab 04/18/18 0429 04/18/18 1747 04/19/18 0433  NA 139 139 143  K 3.8 4.2 4.3  CL 101 99* 104  CO2 28 26 27   GLUCOSE 138* 279* 268*  BUN 67* 78* 89*  CREATININE 2.21* 2.86* 3.29*  CALCIUM 8.1* 7.9* 8.0*  MG  --  2.3  --   AST 25  --   --   ALT 21  --   --   ALKPHOS 68  --   --   BILITOT 1.4*  --   --    ------------------------------------------------------------------------------------------------------------------  Cardiac Enzymes Recent Labs  Lab 04/18/18 1931  TROPONINI 0.11*   ------------------------------------------------------------------------------------------------------------------  RADIOLOGY:  Dg Abd 1 View  Result Date: 04/18/2018 CLINICAL DATA:  Orogastric tube placement EXAM: ABDOMEN - 1 VIEW COMPARISON:  None. FINDINGS: Orogastric tube tip is slightly below the gastroesophageal junction in the proximal gastric cardia. The side port is felt to be above the diaphragm. There is a relative paucity of gas in the visualized abdominal region. No obstruction or free air evident. IMPRESSION: Orogastric tube tip just below the gastroesophageal junction. Advise advancing orogastric tube approximately 8-10 cm to ensure that side-port as well as tube tip are in  the stomach. Relative paucity of gas present. This finding may be seen normally but also may be indicative of early  ileus or enteritis. Electronically Signed   By: Bretta Bang III M.D.   On: 04/18/2018 11:40   Ct Chest Wo Contrast  Result Date: 04/18/2018 CLINICAL DATA:  Inpatient. Acute respiratory illness. Desaturation. Intubated. EXAM: CT CHEST WITHOUT CONTRAST TECHNIQUE: Multidetector CT imaging of the chest was performed following the standard protocol without IV contrast. COMPARISON:  Chest radiograph from earlier today. FINDINGS: Examination is significantly limited by patient body habitus and by streak artifact from the patient's upper extremities. Cardiovascular: Mild cardiomegaly. No significant pericardial effusion/thickening. Aortic valve prosthesis is in place. Coronary atherosclerosis. Atherosclerotic thoracic aorta with ectatic 4.3 cm ascending thoracic aorta. Dilated main pulmonary artery (3.9 cm diameter). Mediastinum/Nodes: No discrete thyroid nodules. Enteric tube terminates in the proximal stomach. No pathologically enlarged axillary, mediastinal or hilar lymph nodes, noting limited sensitivity for the detection of hilar adenopathy on this noncontrast study. Lungs/Pleura: No pneumothorax. No convincing pleural effusions. Endotracheal tube tip is 8.7 cm above the carina. Complete right lower lobe consolidation with scattered air bronchograms and some volume loss. Segmental right middle lobe and medial left lower lobe atelectasis. Patchy ground-glass opacities in the upper lobes bilaterally. Patchy nodular foci of consolidation in the right upper lobe measuring up to 9 mm (series 3/image 39). Upper abdomen: Diffuse hepatic steatosis. Musculoskeletal: No aggressive appearing focal osseous lesions. Moderate symmetric gynecomastia. Discontinuity in the lower most sternotomy wire. Marked thoracic spondylosis. IMPRESSION: 1. Limited scan. Complete right lower lobe consolidation with scattered air bronchograms and some volume loss. Patchy ground-glass opacities in the bilateral upper lobes. Patchy nodular  foci of consolidation in the right upper lobe. Multilobar pneumonia is suspected, with superimposed atelectasis in the right greater than left lungs. 2.  Endotracheal tube tip is 8.7 cm above the carina. 3. Mild cardiomegaly. Dilated main pulmonary artery, suggesting pulmonary arterial hypertension. 4. Ectatic 4.3 cm ascending thoracic aorta. Recommend annual imaging followup by CTA or MRA. This recommendation follows 2010 ACCF/AHA/AATS/ACR/ASA/SCA/SCAI/SIR/STS/SVM Guidelines for the Diagnosis and Management of Patients with Thoracic Aortic Disease. Circulation. 2010; 121: Z610-R604. 5. Diffuse hepatic steatosis. 6. Moderate symmetric gynecomastia. Aortic Atherosclerosis (ICD10-I70.0). Electronically Signed   By: Delbert Phenix M.D.   On: 04/18/2018 10:56   Dg Chest Port 1 View  Result Date: 04/19/2018 CLINICAL DATA:  Hypoxia EXAM: PORTABLE CHEST 1 VIEW COMPARISON:  April 18, 2018 FINDINGS: Endotracheal tube tip is 4.0 cm above the carina. Nasogastric tube tip and side port are below the diaphragm. No pneumothorax. There is a right pleural effusion. There is patchy atelectatic change in both lower lobes with consolidation in the right base region. A lesser degree of consolidation is present in the medial left base. Heart is enlarged with pulmonary vascularity currently within normal limits. Patient is status post median sternotomy. No adenopathy. No bone lesions. IMPRESSION: Tube and catheter positions as described without pneumothorax. Persistent right pleural effusion with bibasilar consolidation, more on the right than on the left. There is also bibasilar atelectatic change. No new opacity evident. Electronically Signed   By: Bretta Bang III M.D.   On: 04/19/2018 07:32   Dg Chest Port 1 View  Result Date: 04/18/2018 CLINICAL DATA:  Hypoxia EXAM: PORTABLE CHEST 1 VIEW COMPARISON:  Chest CT April 18, 2018 FINDINGS: Endotracheal tube tip is 4.8 cm above the carina. Orogastric tube tip is below the  diaphragm and not seen on this study. No pneumothorax. There is a  small right pleural effusion with patchy consolidation in both lower lobes, more on the right than on the left. The lungs elsewhere are clear. 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 No adenopathy evident. Patient is status post median sternotomy. No bone lesions. IMPRESSION: Tube positions as described without evident pneumothorax. Right pleural effusion with bibasilar consolidation, more on the left than on the right, stable. Underlying pulmonary vascular congestion. Electronically Signed   By: Bretta Bang III M.D.   On: 04/18/2018 11:39   Dg Chest Port 1 View  Result Date: 04/18/2018 CLINICAL DATA:  Acute respiratory distress. EXAM: PORTABLE CHEST 1 VIEW COMPARISON:  16 2019 FINDINGS: Endotracheal tube tip is 6 cm above the carina. Nasogastric tube enters the abdomen. Right subclavian central line tip at the SVC RA junction. Support artifact overlies the chest. Left lung is largely clear, possibly with mild basilar atelectasis. There is a right effusion more pronounced atelectasis in the right lower. IMPRESSION: Overlying artifact. Atelectasis right worse than left. Right effusion. Electronically Signed   By: Paulina Fusi M.D.   On: 04/18/2018 07:47   Dg Chest Port 1 View  Result Date: 04/17/2018 CLINICAL DATA:  PICC line placement EXAM: PORTABLE CHEST 1 VIEW COMPARISON:  Chest radiograph 04/17/2018 FINDINGS: Right-sided PICC line tip is in the mid SVC. There is a right IJ approach central venous catheter with its tip at the cavoatrial junction, unchanged. Endotracheal tube and gastric tube are unchanged. Lung the mediastinum are unchanged. IMPRESSION: Unchanged examination with right-sided PICC line tip in the mid SVC. Electronically Signed   By: Deatra Robinson M.D.   On: 04/17/2018 17:43   Dg Chest Port 1 View  Result Date: 04/17/2018 CLINICAL DATA:  47 year old male with a history of PICC placement EXAM:  PORTABLE CHEST 1 VIEW COMPARISON:  04/17/2018, 04/14/2018 FINDINGS: Study is limited by underpenetration. Redemonstration of surgical changes of median sternotomy. Right IJ central venous catheter is unchanged, with the tip appearing to terminate superior vena cava. Endotracheal tube terminates 4.2 cm above the carina. Gastric tube terminates out of the field of view. Lung volumes remain low with patchy airspace opacities bilaterally. No evidence of left or upper extremity PICC. IMPRESSION: Low lung volumes persist with unchanged appearance of patchy airspace opacities bilaterally. No PICC is visualized. Unchanged position of right IJ central venous catheter, endotracheal tube, gastric tube. Electronically Signed   By: Gilmer Mor D.O.   On: 04/17/2018 15:53    ASSESSMENT AND PLAN:  47 year old male with past medical history significant for A. fib status post ablation, aortic valve replacement on warfarin, history of congestive heart failure, CKD, morbid obesity, diabetes and sleep apnea who was getting an outpatient MRI of his lower back had a cardiac arrest.  *Acute cardiopulmonary arrest  from respiratory causes. Difficult weaning from vent, and discussion with intensivist-plans for possible tracheostomy next week  *Acute VAP Continue with nasal lid/cefepime and follow-up on cultures  * Atrial fibrillation Stable Continue amiodarone, heparin drip  *History of mechanical aortic valve replacement Stable Continue heparin drip  *Acute toxic metabolic encephalopathy  Neurology input appreciated  Continue Keppra for possible partial seizures  *Chronic diabetes mellitus type 2 Stable on current regiment   *acute renal failure w/ CKD Secondary to ATN  *Acute septic shock Resolved Empiric antibiotics as stated above    All the records are reviewed and case discussed with Care Management/Social Workerr. Management plans discussed with the patient, family and they are in  agreement.  CODE STATUS: full  TOTAL  TIME TAKING CARE OF THIS PATIENT: 35 minutes.     POSSIBLE D/C IN 5 DAYS, DEPENDING ON CLINICAL CONDITION.   Evelena Asa Salary M.D on 04/19/2018   Between 7am to 6pm - Pager - [phone]  After 6pm go to [email or web address] - password [employer]  [facility] [place] [facility]  Office  [phone]  CC: Primary care physician; Erasmo Downer, MD  Note: This dictation was prepared with Dragon dictation along with smaller phrase technology. Any transcriptional errors that result from this process are unintentional.

## 2018-04-20 LAB — CBC WITH DIFFERENTIAL/PLATELET
Basophils Absolute: 0.1 10*3/uL (ref 0–0.1)
Basophils Relative: 0 %
EOS PCT: 0 %
Eosinophils Absolute: 0 10*3/uL (ref 0–0.7)
HEMATOCRIT: 37.8 % — AB (ref 40.0–52.0)
HEMOGLOBIN: 12.4 g/dL — AB (ref 13.0–18.0)
LYMPHS PCT: 14 %
Lymphs Abs: 2.2 10*3/uL (ref 1.0–3.6)
MCH: 27.7 pg (ref 26.0–34.0)
MCHC: 32.7 g/dL (ref 32.0–36.0)
MCV: 84.8 fL (ref 80.0–100.0)
MONO ABS: 2.7 10*3/uL — AB (ref 0.2–1.0)
MONOS PCT: 17 %
NEUTROS ABS: 11 10*3/uL — AB (ref 1.4–6.5)
Neutrophils Relative %: 69 %
Platelets: 185 10*3/uL (ref 150–440)
RBC: 4.45 MIL/uL (ref 4.40–5.90)
RDW: 18.2 % — AB (ref 11.5–14.5)
WBC: 16 10*3/uL — ABNORMAL HIGH (ref 3.8–10.6)

## 2018-04-20 LAB — GLUCOSE, CAPILLARY
GLUCOSE-CAPILLARY: 144 mg/dL — AB (ref 65–99)
GLUCOSE-CAPILLARY: 161 mg/dL — AB (ref 65–99)
GLUCOSE-CAPILLARY: 191 mg/dL — AB (ref 65–99)
Glucose-Capillary: 200 mg/dL — ABNORMAL HIGH (ref 65–99)
Glucose-Capillary: 201 mg/dL — ABNORMAL HIGH (ref 65–99)
Glucose-Capillary: 257 mg/dL — ABNORMAL HIGH (ref 65–99)

## 2018-04-20 LAB — HEPARIN LEVEL (UNFRACTIONATED)
HEPARIN UNFRACTIONATED: 0.23 [IU]/mL — AB (ref 0.30–0.70)
HEPARIN UNFRACTIONATED: 0.36 [IU]/mL (ref 0.30–0.70)
Heparin Unfractionated: 0.29 IU/mL — ABNORMAL LOW (ref 0.30–0.70)

## 2018-04-20 LAB — PROCALCITONIN: PROCALCITONIN: 41.34 ng/mL

## 2018-04-20 MED ORDER — HEPARIN (PORCINE) IN NACL 100-0.45 UNIT/ML-% IJ SOLN: 1600.0000 [IU]/h | INTRAMUSCULAR | Status: DC

## 2018-04-20 MED ORDER — HEPARIN BOLUS VIA INFUSION
1600.0000 [IU] | Freq: Once | INTRAVENOUS | Status: AC
  Administered 2018-04-20: 1600 [IU] via INTRAVENOUS
  Filled 2018-04-20: qty 1600

## 2018-04-20 MED ORDER — DILTIAZEM LOAD VIA INFUSION
10.0000 mg | Freq: Once | INTRAVENOUS | Status: AC
  Administered 2018-04-20: 10 mg via INTRAVENOUS
  Filled 2018-04-20: qty 10

## 2018-04-20 MED ORDER — METOPROLOL TARTRATE 50 MG PO TABS
50.0000 mg | ORAL_TABLET | Freq: Two times a day (BID) | ORAL | Status: DC
  Administered 2018-04-20 – 2018-05-19 (×57): 50 mg
  Filled 2018-04-20 (×58): qty 1

## 2018-04-20 MED ORDER — INSULIN ASPART 100 UNIT/ML ~~LOC~~ SOLN
5.0000 [IU] | SUBCUTANEOUS | Status: DC
  Administered 2018-04-21 – 2018-04-24 (×21): 5 [IU] via SUBCUTANEOUS
  Filled 2018-04-20 (×21): qty 1

## 2018-04-20 MED ORDER — DILTIAZEM HCL-DEXTROSE 100-5 MG/100ML-% IV SOLN (PREMIX)
0.0000 mg/h | INTRAVENOUS | Status: AC
  Administered 2018-04-20: 10 mg/h via INTRAVENOUS
  Administered 2018-04-20: 5 mg/h via INTRAVENOUS
  Administered 2018-04-21: 7.5 mg/h via INTRAVENOUS
  Administered 2018-04-21: 10 mg/h via INTRAVENOUS
  Administered 2018-04-22 – 2018-04-24 (×4): 7.5 mg/h via INTRAVENOUS
  Filled 2018-04-20 (×10): qty 100

## 2018-04-20 MED ORDER — INSULIN GLARGINE 100 UNIT/ML ~~LOC~~ SOLN
85.0000 [IU] | Freq: Two times a day (BID) | SUBCUTANEOUS | Status: DC
  Administered 2018-04-21 – 2018-04-24 (×6): 85 [IU] via SUBCUTANEOUS
  Filled 2018-04-20 (×8): qty 0.85

## 2018-04-20 NOTE — Progress Notes (Signed)
[facility] [place] at [facility]   PATIENT NAME: Alexander Alexander    MR#:  [id number]  DATE OF BIRTH:  11-Jan-1971  SUBJECTIVE:  CHIEF COMPLAINT:   Chief Complaint  Patient presents with  . Back Pain  In discussion with intensivist, patient remains critically ill, difficult weaning off ventilator, for tracheostomy next week  REVIEW OF SYSTEMS:  CONSTITUTIONAL: No fever, fatigue or weakness.  EYES: No blurred or double vision.  EARS, NOSE, AND THROAT: No tinnitus or ear pain.  RESPIRATORY: No cough, shortness of breath, wheezing or hemoptysis.  CARDIOVASCULAR: No chest pain, orthopnea, edema.  GASTROINTESTINAL: No nausea, vomiting, diarrhea or abdominal pain.  GENITOURINARY: No dysuria, hematuria.  ENDOCRINE: No polyuria, nocturia,  HEMATOLOGY: No anemia, easy bruising or bleeding SKIN: No rash or lesion. MUSCULOSKELETAL: No joint pain or arthritis.   NEUROLOGIC: No tingling, numbness, weakness.  PSYCHIATRY: No anxiety or depression.   ROS  DRUG ALLERGIES:   Allergies  Allergen Reactions  . Lexapro [Escitalopram Oxalate] Swelling    VITALS:  Blood pressure 117/85, pulse (!) 101, temperature 99 F (37.2 C), temperature source Oral, resp. rate 18, height 5\' 10"  (1.778 m), weight (!) 189.1 kg (417 lb), SpO2 90 %.  PHYSICAL EXAMINATION:  GENERAL:  47 y.o.-year-old patient lying in the bed with no acute distress.  EYES: Pupils equal, round, reactive to light and accommodation. No scleral icterus. Extraocular muscles intact.  HEENT: Head atraumatic, normocephalic. Oropharynx and nasopharynx clear.  NECK:  Supple, no jugular venous distention. No thyroid enlargement, no tenderness.  LUNGS: Normal breath sounds bilaterally, no wheezing, rales,rhonchi or crepitation. No use of accessory muscles of respiration.  CARDIOVASCULAR: S1, S2 normal. No murmurs, rubs, or gallops.  ABDOMEN: Soft, nontender, nondistended. Bowel sounds present. No organomegaly or mass.   EXTREMITIES: No pedal edema, cyanosis, or clubbing.  NEUROLOGIC: Cranial nerves II through XII are intact. Muscle strength 5/5 in all extremities. Sensation intact. Gait not checked.  PSYCHIATRIC: The patient is alert and oriented x 3.  SKIN: No obvious rash, lesion, or ulcer.   Physical Exam LABORATORY PANEL:   CBC Recent Labs  Lab 04/20/18 0306  WBC 16.0*  HGB 12.4*  HCT 37.8*  PLT 185   ------------------------------------------------------------------------------------------------------------------  Chemistries  Recent Labs  Lab 04/18/18 0429 04/18/18 1747 04/19/18 0433  NA 139 139 143  K 3.8 4.2 4.3  CL 101 99* 104  CO2 28 26 27   GLUCOSE 138* 279* 268*  BUN 67* 78* 89*  CREATININE 2.21* 2.86* 3.29*  CALCIUM 8.1* 7.9* 8.0*  MG  --  2.3  --   AST 25  --   --   ALT 21  --   --   ALKPHOS 68  --   --   BILITOT 1.4*  --   --    ------------------------------------------------------------------------------------------------------------------  Cardiac Enzymes Recent Labs  Lab 04/18/18 1931  TROPONINI 0.11*   ------------------------------------------------------------------------------------------------------------------  RADIOLOGY:  Dg Chest Port 1 View  Result Date: 04/19/2018 CLINICAL DATA:  Hypoxia EXAM: PORTABLE CHEST 1 VIEW COMPARISON:  April 18, 2018 FINDINGS: Endotracheal tube tip is 4.0 cm above the carina. Nasogastric tube tip and side port are below the diaphragm. No pneumothorax. There is a right pleural effusion. There is patchy atelectatic change in both lower lobes with consolidation in the right base region. A lesser degree of consolidation is present in the medial left base. Heart is enlarged with pulmonary vascularity currently within normal limits. Patient is status post median sternotomy. No adenopathy. No bone  lesions. IMPRESSION: Tube and catheter positions as described without pneumothorax. Persistent right pleural effusion with bibasilar  consolidation, more on the right than on the left. There is also bibasilar atelectatic change. No new opacity evident. Electronically Signed   By: Bretta BangWilliam  Woodruff III M.D.   On: 04/19/2018 07:32    ASSESSMENT AND PLAN:  47 year old male with past medical history significant for A. fib status post ablation, aortic valve replacement on warfarin, history of congestive heart failure, CKD, morbid obesity, diabetes and sleep apnea who was getting an outpatient MRI of his lower back had a cardiac arrest.  *Acute cardiopulmonary arrest  From respiratory causes. Difficult weaning from vent, in d/w intensivist-plans for possible tracheostomy next week, continue tube feeds  *Acute MRSA/VAP Continue with Linezolid/cefepime, vent protocol  *Atrial fibrillation with RVR Stable Continue metoprolol, amiodarone, heparin drip  *History of mechanical aortic valve replacement Stable On heparin drip  *Acute toxic metabolic encephalopathy  Neurology input appreciated  Continue Keppra for possible partial seizures  *Chronic diabetes mellitus type 2 Stable on current regiment   *acute renal failure w/ CKD Secondary to ATN  *Acute septic shock Resolved Empiric antibiotics as stated above   All the records are reviewed and case discussed with Care Management/Social Workerr. Management plans discussed with the patient, family and they are in agreement.  CODE STATUS:full  TOTAL TIME TAKING CARE OF THIS PATIENT: 35 minutes.     POSSIBLE D/C IN 5 DAYS, DEPENDING ON CLINICAL CONDITION.   Evelena AsaMontell D Cj Beecher M.D on 04/20/2018   Between 7am to 6pm - Pager - [phone]  After 6pm go to [email or web address] - password [employer] [email or web address]  [facility] [place] [facility]  Office  [phone]  CC: Primary care physician; Erasmo DownerBacigalupo, Angela M, MD  Note: This dictation was prepared with Dragon dictation along with smaller phrase technology. Any transcriptional errors that result from this process are  unintentional.

## 2018-04-20 NOTE — Progress Notes (Signed)
PULMONARY / CRITICAL CARE MEDICINE   Name: Alexander RiffleOtis Leon Alexander MRN: [id number] DOB: August 15, 1971    ADMISSION DATE:  04/07/2018  PT PROFILE:   72M with PMH of atrial fibrillation, status post ablation and aortic valve replacement on long term warfarin, CHF, CKD, diabetes, obstructive sleep apnea and hypertension, suffered a pulseless VT cardiac arrest in MRI when he was being evaluated for back pain and sciatica, subsequently intubated in the intensive care unit. Difficult airway noted  EVENTS/RESULTS: 06/11 CT chest: Dense RLL consolidation.  Segmental RML and LLL atelectasis.  Patchy GGO in upper lobes bilaterally 06/12 rough night with desaturation.  Transient bradycardia.  06/13 More responsive.  Gas exchange improving.  No further bradycardia.  Amiodarone has been stopped.  Now with AFRVR (up to 150/min).  Follows commands.  Remains on fentanyl infusion.  Propofol has been discontinued.   INDWELLING DEVICES:: R IJ CVL 05/31 >> 06/10 ETT 05/31 >>  RUE PICC 06/10 >>    MICRO DATA: MRSA PCR 05/31 >> NEG Urine 06/01 >> NEG Resp 06/01 >> MRSA Blood 06/01 >> NEG C diff 06/04 >> NEG  Urine 06/05 >> NEG Resp 06/05 >> MRSA Blood 06/05 >> NEG Resp 06/11 >> moderate S aureus (sens pending) >>   ANTIMICROBIALS:  Pip-tazo 05/31 >> 06/03 Vanc 05/31 >> 6/12 Cefepime 06/11 >>  Linezolid 06/12 >>   SUBJECTIVE:  RASS 0. More responsive.  Gas exchange improving.  No further bradycardia.  Amiodarone has been stopped.  Now with AFRVR (up to 150/min).  Follows commands.  Remains on fentanyl infusion.  Propofol has been discontinued.  VITAL SIGNS: BP (!) 129/93   Pulse (!) 119   Temp 99 F (37.2 C) (Oral)   Resp 18   Ht 5\' 10"  (1.778 m) Comment: based on previous notes  Wt (!) 417 lb (189.1 kg)   SpO2 94%   BMI 59.83 kg/m   HEMODYNAMICS:    VENTILATOR SETTINGS: Vent Mode: PCV FiO2 (%):  [50 %-60 %] 50 % Set Rate:  [12 bmp-30 bmp] 16 bmp Vt Set:  [400 mL] 400 mL PEEP:  [10  cmH20-12 cmH20] 10 cmH20 Plateau Pressure:  [14 cmH20] 14 cmH20  INTAKE / OUTPUT: I/O last 3 completed shifts: In: 2734.7 [I.V.:1744.7; NG/GT:60; IV Piggyback:930] Out: 3860 [Urine:3330; Emesis/NG output:400; Other:80; Stool:50]  PHYSICAL EXAMINATION: General: NAD, follows commands Neuro: Moves all extremities, cranial nerves intact HEENT: WNL Cardiovascular: IRIR, tachy no M Lungs: No wheezes or other adventitious sounds anteriorly Abdomen: NT, nondistended, soft, diminished BS Ext: Mild symmetric LE edema Skin: No lesions noted  LABS:  BMET Recent Labs  Lab 04/18/18 0429 04/18/18 1747 04/19/18 0433  NA 139 139 143  K 3.8 4.2 4.3  CL 101 99* 104  CO2 28 26 27   BUN 67* 78* 89*  CREATININE 2.21* 2.86* 3.29*  GLUCOSE 138* 279* 268*    Electrolytes Recent Labs  Lab 04/16/18 1140 04/17/18 0404 04/18/18 0429 04/18/18 1747 04/19/18 0433  CALCIUM  --  8.6* 8.1* 7.9* 8.0*  MG 2.5* 2.5*  --  2.3  --   PHOS  --  3.4  --  5.3*  --     CBC Recent Labs  Lab 04/18/18 1931 04/19/18 0433 04/20/18 0306  WBC 18.4* 17.4* 16.0*  HGB 11.5* 12.2* 12.4*  HCT 35.7* 37.7* 37.8*  PLT 179 195 185    Coag's Recent Labs  Lab 04/14/18 0501 04/18/18 1552  APTT  --  83*  INR 1.23 1.37    Sepsis Markers  Recent Labs  Lab 04/18/18 0921 04/19/18 0433 04/20/18 0306  PROCALCITON 1.54 42.96 41.34    ABG Recent Labs  Lab 04/18/18 0555 04/18/18 1549 04/18/18 1952  PHART 7.38 7.21* 7.32*  PCO2ART 60* 61* 63*  PO2ART 54* 60* 72*    Liver Enzymes Recent Labs  Lab 04/18/18 0429  AST 25  ALT 21  ALKPHOS 68  BILITOT 1.4*  ALBUMIN 2.6*    Cardiac Enzymes Recent Labs  Lab 04/18/18 1931  TROPONINI 0.11*    Glucose Recent Labs  Lab 04/19/18 1144 04/19/18 1553 04/19/18 2004 04/19/18 2318 04/20/18 0314 04/20/18 0720  GLUCAP 194* 215* 183* 204* 144* 161*    CXR: No new film   ASSESSMENT / PLAN:  PULMONARY A: Respiratory arrest 05/31 Prolonged  ventilator dependence Persistently high FiO2/PEEP requirements RLL consolidation P:   Cont vent support - settings reviewed and/or adjusted  Changed to PCV mode 6/12 to improve synchrony Cont vent bundle Daily SBT if/when meets criteria ENT consultation 6/10 - trach planned for 6/17 or 6/18  We will need to make sure that heparin is held Sunday night  CARDIOVASCULAR A:  VT cardiac arrest 5/31 (felt secondary to respiratory rest) Chronic atrial fibrillation with RVR Transient bradycardia 6/12 P:  MAP goal > 65 mmHg Continue heparin infusion Continue enteral metoprolol initiated 6/11 Amiodarone discontinued 6/12 Initiate diltiazem infusion 6/13  RENAL A:   CKD, baseline creatinine 2.0-2.5 Worsening AKI Hypervolemia P:   Monitor BMET intermittently Monitor I/Os Correct electrolytes as indicated  No further diuresis today  GASTROINTESTINAL A:   Abdominal distention, resolved P:   SUP: Enteral famotidine Resume TF protocol  HEMATOLOGIC A:   Very mild ICU acquired anemia P:  DVT px: heparin infusion (A. fib) Monitor CBC intermittently Transfuse per usual guidelines   INFECTIOUS A:   MRSA pneumonia Persistent fevers Severe sepsis P:   Monitor temp, WBC count Micro and abx as above  If only SA in resp culture, we can probably DC cefepime  ENDOCRINE A:   DM 2 with severe insulin resistance P:   Continue Lantus initiated 6/11, dose increased 6/12 Continue SSI, resistance scale Continue TF coverage Would consider holding Lantus dose on the evening prior to tracheostomy tube to avoid hypoglycemia  NEUROLOGIC A:   Acute encephalopathy, improved ICU/vent associated discomfort P:   RASS goal: 1, -2 Continue PAD protocol -fentanyl infusion Propofol discontinued 6/12   FAMILY  Wife updated at bedside   CCM time: 35 mins The above time includes time spent in consultation with patient and/or family members and reviewing care plan on multidisciplinary  rounds  Billy Fischer, MD PCCM service Mobile [phone] Pager [phone] 04/20/2018, 11:07 AM

## 2018-04-20 NOTE — Progress Notes (Signed)
Nutrition Follow-up  DOCUMENTATION CODES:   Morbid obesity  INTERVENTION:  Continue Vital High Protein at 50 mL/hr (1200 mL goal daily volume) + Pro-Stat 60 mL TID via OGT. Provides 1800 kcal, 195 grams of protein, 134 grams of carbohydrate, 1008 mL H2O daily.  When tube feeds run at goal rate patient will receive 5.6 grams of carbohydrate per hour.  Continue liquid MVI daily per tube.  With free water flush of 200 mL Q8hrs patient is receiving a total of 1608 mL H2O daily including water in tube feeding.  NUTRITION DIAGNOSIS:   Inadequate oral intake related to inability to eat as evidenced by NPO status.  Ongoing - addressing with TF.  GOAL:   Provide needs based on ASPEN/SCCM guidelines  Met with TF regimen.  MONITOR:   Vent status, Labs, Weight trends, Skin, TF tolerance, I & O's  REASON FOR ASSESSMENT:   Ventilator, Consult Enteral/tube feeding initiation and management  ASSESSMENT:   47 year old male with PMHx of A-fib s/p ablation and mechanical aortic valve replacement on warfarin, HTN, DM type 2, CHF, OSA, anxiety, CKD, HLD, pancreatitis who presented with lower back pain and sciatica. While in MRI on 5/31 suite patient suffered pulseless V. tach cardiac arrest with 5 minutes ACLS before ROSC obtained, and was intubated.   -On evening of 6/11 patient had desaturation and transient brady cardia. Tube feeds were held at that time.  Patient remains sedated and on mechanical ventilation. ENT has been consulted for tracheostomy tube placement. Abdomen feels soft. Noted mild pitting edema in extremities.  Access: 18 Fr. OGT placed 5/31; tip in proximal gastric cardia per abdominal x-ray on 6/11 prior to advancement; 63 cm at corner of mouth after advancement  TF: tube feeds have been held since evening of 6/11; tube feeds are being restarted today  Patient is currently intubated on ventilator support Ve: 12.8 L/min Temp (24hrs), Avg:100.2 F (37.9 C), Min:99 F  (37.2 C), Max:101.2 F (38.4 C)  Propofol: N/A  Medications reviewed and include: famotidine, free water flush 200 mL Q8hrs, Novolog 0-20 units Q4hrs (received 26 units past 24 hrs), Novolog 4 units Q4hrs for TF coverage, Lantus 80 units BID, liquid MVI daily per tube, cefepime, diltiazem gtt, fentanyl gtt, heparin gtt, linezolid. Patient off propofol.  Labs reviewed: CBG 144-215 past 24 hrs. On 6/12 BUN 89, Creatinine 3.29, Triglycerides 133.  I/O: UOP 2540 mL yesterday (0.6 mL/kg/hr); 50 mL output in rectal tube yesterday; 300 mL output from OGT  Weight trend: 189.1 kg on 6/13; +21.3 kg from admission  Discussed with RN and on rounds. Plan is to restart tube feeds today.  Diet Order:   Diet Order    None      EDUCATION NEEDS:   No education needs have been identified at this time  Skin:  Skin Assessment: Skin Integrity Issues: Skin Integrity Issues:: Stage II Stage II: penis  Last BM:  04/20/2018 - small type 7; rectal tube in place  Height:   Ht Readings from Last 1 Encounters:  04/07/18 '5\' 10"'$  (1.778 m)    Weight:   Wt Readings from Last 1 Encounters:  04/20/18 (!) 417 lb (189.1 kg)    Ideal Body Weight:  75.5 kg  BMI:  Body mass index is 59.83 kg/m.  Estimated Nutritional Needs:   Kcal:  4580-9983 (22-25 kcal/kg IBW)  Protein:  189 grams (2.5 grams/kg IBW)  Fluid:  1.8 L/day (25 mL/kg IBW)  Willey Blade, MS, RD, LDN Office: [phone] Pager:  [phone] After Hours/Weekend Pager: [phone]

## 2018-04-20 NOTE — Progress Notes (Signed)
[facility] for heparin drip management Indication: atrial fibrillation and aortic valve replacement   Allergies  Allergen Reactions  . Lexapro [Escitalopram Oxalate] Swelling    Patient Measurements: Height: _0  (177.8 cm)(based on previous notes) Weight: (!) 417 lb (189.1 kg) IBW/kg (Calculated) : 73 Heparin Dosing Weight: 111kg  Vital Signs: Temp: 99 F (37.2 C) (06/13 1105) Temp Source: Oral (06/13 1105) BP: 109/81 (06/13 1345) Pulse Rate: 97 (06/13 1345)  Labs: Recent Labs    04/18/18 0429 04/18/18 1552 04/18/18 1747 04/18/18 1931 04/19/18 0433  04/19/18 2004 04/20/18 0306 04/20/18 0555 04/20/18 1320  HGB 12.8* 11.5*  --  11.5* 12.2*  --   --  12.4*  --   --   HCT 38.3* 37.7*  --  35.7* 37.7*  --   --  37.8*  --   --   PLT 201 182  --  179 195  --   --  185  --   --   APTT  --  83*  --   --   --   --   --   --   --   --   LABPROT  --  16.8*  --   --   --   --   --   --   --   --   INR  --  1.37  --   --   --   --   --   --   --   --   HEPARINUNFRC 0.33  --   --   --  1.27*   < > 1.95*  --  0.23* 0.36  CREATININE 2.21*  --  2.86*  --  3.29*  --   --   --   --   --   TROPONINI  --   --   --  0.11*  --   --   --   --   --   --    < > = values in this interval not displayed.    Estimated Creatinine Clearance: 46.4 mL/min (A) (by C-G formula based on SCr of 3.29 mg/dL (H)).   Medical History: Past Medical History:  Diagnosis Date  . Allergy   . Anxiety   . Arrhythmia   . Arthritis   . Atrial fibrillation ([place])   . CHF (congestive heart failure) ([place])   . CKD (chronic kidney disease)   . Diabetes mellitus without complication ([place])   . Diabetes mellitus, type II ([place])   . Heart failure ([place])   . Hyperlipidemia   . Hypertension   . Pancreatitis   . Sleep apnea     Medications:  Scheduled:  . chlorhexidine gluconate (MEDLINE KIT)  15 mL Mouth Rinse BID  . famotidine  20 mg Per Tube Daily  . feeding supplement  (PRO-STAT SUGAR FREE 64)  60 mL Per Tube TID  . free water  200 mL Per Tube Q8H  . insulin aspart  0-20 Units Subcutaneous Q4H  . insulin aspart  4 Units Subcutaneous Q4H  . insulin glargine  80 Units Subcutaneous BID  . mouth rinse  15 mL Mouth Rinse 10 times per day  . metoprolol tartrate  50 mg Per Tube BID  . multivitamin  15 mL Per Tube Daily  . sodium chloride flush  10-40 mL Intracatheter Q12H   Infusions:  . ceFEPime (MAXIPIME) IV Stopped (04/20/18 1047)  . diltiazem (CARDIZEM) infusion 10 mg/hr (04/20/18 1100)  .  feeding supplement (VITAL HIGH PROTEIN) Stopped (04/18/18 1601)  . fentaNYL infusion INTRAVENOUS 250 mcg/hr (04/20/18 0800)  . heparin 1,900 Units/hr (04/20/18 0758)  . linezolid (ZYVOX) IV Stopped (04/20/18 1152)    Assessment: Pharmacy consulted for warfarin and heparin drip management for 47 yo male admitted to the ICU s/p cardiac arrest. Patient has history significant for aortic valve replacement, atrial fibrillation and CHF. Head CT is negative for infarction or hemorrhage.   Patient takes warfarin 58m daily as an outpatient for goal INR 2.5-3.5.    Goal of Therapy:  INR 2-3 Heparin level 0.3-0.7 units/ml Monitor platelets by anticoagulation protocol: Yes   Plan:  Heparin level is at goal. Will continue heparin infusion at 2000 units/hr and recheck a HL in  6 hours.   04/19/2018 20:04 HL supratherapeutic x 1. Spoke with RN - please hold x 1 hour then resume at 1800 units/hr. Recheck HL 6 hours after resuming infusion.  04/19/2018 22:56 RN called back to tell uKoreapatient is a line draw which may have affected the heparin level. She also says she held heparin infusion at least 5 minutes before draw and flushed the line per protocol. We agreed that recheck now would be artificially low since infusion has been on hold, but will get peripheral stick for AM lab. She'll follow up with lab to let them know peripheral needed for 05:00.   04/20/2018 05:55 HL  subtherapeutic x 1. 1600 units IV bolus x 1 and increase rate to 1900 units/hr. Will recheck HL 6 hours after increase.  04/20/2018 13:20 HL therapeutic x 1. Will continue current infusion rate of 1900u/hrs. Will recheck confirmatory HL in 6 hours.  SPernell Dupre PharmD, BCPS Clinical Pharmacist 04/20/2018 1:55 PM

## 2018-04-20 NOTE — Progress Notes (Signed)
Pharmacy Antibiotic Note  Alexander Alexander is a 47 y.o. male admitted on 04/07/2018 s/p cardiac arrest started on antibiotics for MRSA Pneumonia Pharmacy has been consulted for Vancomycin dosing.   6/11 Pharmacy consulted for cefepime dosing for HCAP  6/12 Vancomycin discontinued. Linezolid started.   Plan: Continue  Cefepime 2g IV every 12 hours.   Height: 5\' 10"  (177.8 cm)(based on previous notes) Weight: (!) 417 lb (189.1 kg) IBW/kg (Calculated) : 73  Temp (24hrs), Avg:100.2 F (37.9 C), Min:99 F (37.2 C), Max:101.2 F (38.4 C)  Recent Labs  Lab 04/14/18 0103  04/16/18 0430 04/16/18 1415 04/17/18 0404 04/17/18 1033 04/18/18 0328 04/18/18 0429 04/18/18 1747 04/18/18 1931 04/19/18 0433 04/20/18 0306  WBC  --    < > 14.1*  --   --  13.5*  --  13.2*  --  18.4* 17.4* 16.0*  CREATININE  --    < > 2.26*  --  2.24*  --   --  2.21* 2.86*  --  3.29*  --   VANCOTROUGH 16  --   --  32*  --   --   --   --   --   --   --   --   VANCORANDOM  --   --   --   --  17  --  16  --   --   --   --   --    < > = values in this interval not displayed.    Estimated Creatinine Clearance: 46.4 mL/min (A) (by C-G formula based on SCr of 3.29 mg/dL (H)).    Allergies  Allergen Reactions  . Lexapro [Escitalopram Oxalate] Swelling    Antimicrobials this admission: Zosyn 5/31 >> 6/3 Vancomycin 6/2 >>6/12 Unasyn 6/3 x 1.  Cefepime 6/11 >> Linezolid 6/12>>  Dose adjustments this admission: 6/3 Vancomycin transitioned to 1250mg  IV Q12hr.  6/4 Vancomycin transitioned to 1250mg  IV Q18hr.  6/7 Vancomycin transitioned to 1000mg  IV Q12hr. 6/11 Started vancomycin 1250 mg q 24 hours.   Microbiology results: 5/31 MRSA PCR: negative 6/1 BCx: no growth 6/1 UCx: no growth  6/1 Resp Cx/Trach Aspirate: MRSA  6/5 BCx: no growth x2 days 6/5 UCx: no growth   6/5 SputumCx: few staph aureus 6/5 CDiff: negative  Thank you for allowing pharmacy to be a part of this patient's care.  Gardner CandleSheema M  Shaia Porath, PharmD, BCPS Clinical Pharmacist 04/20/2018 11:20 AM

## 2018-04-20 NOTE — Progress Notes (Signed)
Safety mitts in place per Dr. Sung AmabileSimonds to keep patient from pulling out ET tube.

## 2018-04-21 ENCOUNTER — Inpatient Hospital Stay: Payer: Medicare HMO

## 2018-04-21 LAB — GLUCOSE, CAPILLARY
GLUCOSE-CAPILLARY: 292 mg/dL — AB (ref 65–99)
GLUCOSE-CAPILLARY: 222 mg/dL — AB (ref 65–99)
GLUCOSE-CAPILLARY: 229 mg/dL — AB (ref 65–99)
GLUCOSE-CAPILLARY: 213 mg/dL — AB (ref 65–99)
GLUCOSE-CAPILLARY: 214 mg/dL — AB (ref 65–99)
Glucose-Capillary: 207 mg/dL — ABNORMAL HIGH (ref 65–99)

## 2018-04-21 LAB — COMPREHENSIVE METABOLIC PANEL
ALT: 166 U/L — ABNORMAL HIGH (ref 17–63)
ANION GAP: 8 (ref 5–15)
AST: 135 U/L — ABNORMAL HIGH (ref 15–41)
Albumin: 2.5 g/dL — ABNORMAL LOW (ref 3.5–5.0)
Alkaline Phosphatase: 69 U/L (ref 38–126)
BUN: 96 mg/dL — ABNORMAL HIGH (ref 6–20)
CHLORIDE: 105 mmol/L (ref 101–111)
CO2: 30 mmol/L (ref 22–32)
Calcium: 9 mg/dL (ref 8.9–10.3)
Creatinine, Ser: 3.05 mg/dL — ABNORMAL HIGH (ref 0.61–1.24)
GFR, EST AFRICAN AMERICAN: 26 mL/min — AB (ref >60)
GFR, EST NON AFRICAN AMERICAN: 23 mL/min — AB (ref >60)
Glucose, Bld: 264 mg/dL — ABNORMAL HIGH (ref 65–99)
Potassium: 4.7 mmol/L (ref 3.5–5.1)
Sodium: 143 mmol/L (ref 135–145)
Total Bilirubin: 1.7 mg/dL — ABNORMAL HIGH (ref 0.3–1.2)
Total Protein: 8.1 g/dL (ref 6.5–8.1)

## 2018-04-21 LAB — HEPARIN LEVEL (UNFRACTIONATED)
HEPARIN UNFRACTIONATED: 0.18 [IU]/mL — AB (ref 0.30–0.70)
HEPARIN UNFRACTIONATED: 0.39 [IU]/mL (ref 0.30–0.70)

## 2018-04-21 LAB — CBC
HCT: 38.8 % — ABNORMAL LOW (ref 40.0–52.0)
Hemoglobin: 12.5 g/dL — ABNORMAL LOW (ref 13.0–18.0)
MCH: 27.7 pg (ref 26.0–34.0)
MCHC: 32.2 g/dL (ref 32.0–36.0)
MCV: 86.1 fL (ref 80.0–100.0)
PLATELETS: 182 10*3/uL (ref 150–440)
RBC: 4.5 MIL/uL (ref 4.40–5.90)
RDW: 18.4 % — ABNORMAL HIGH (ref 11.5–14.5)
WBC: 17.1 10*3/uL — ABNORMAL HIGH (ref 3.8–10.6)

## 2018-04-21 LAB — CULTURE, RESPIRATORY W GRAM STAIN

## 2018-04-21 LAB — CULTURE, RESPIRATORY

## 2018-04-21 LAB — PROCALCITONIN: Procalcitonin: 29.96 ng/mL

## 2018-04-21 MED ORDER — HEPARIN BOLUS VIA INFUSION
3600.0000 [IU] | Freq: Once | INTRAVENOUS | Status: AC
  Administered 2018-04-21: 3600 [IU] via INTRAVENOUS
  Filled 2018-04-21: qty 3600

## 2018-04-21 NOTE — Progress Notes (Signed)
[facility] for heparin drip management Indication: atrial fibrillation and aortic valve replacement   Allergies  Allergen Reactions  . Lexapro [Escitalopram Oxalate] Swelling    Patient Measurements: Height: _0  (177.8 cm) Weight: (!) 415 lb (188.2 kg) IBW/kg (Calculated) : 73 Heparin Dosing Weight: 111kg  Vital Signs: Temp: 98.7 F (37.1 C) (06/14 1123) Temp Source: Oral (06/14 1123) BP: 112/83 (06/14 1130) Pulse Rate: 122 (06/14 1130)  Labs: Recent Labs    04/18/18 1552 04/18/18 1747 04/18/18 1931 04/19/18 0433  04/20/18 0306  04/20/18 1320 04/20/18 2010 04/21/18 0440 04/21/18 0817  HGB 11.5*  --  11.5* 12.2*  --  12.4*  --   --   --  12.5*  --   HCT 37.7*  --  35.7* 37.7*  --  37.8*  --   --   --  38.8*  --   PLT 182  --  179 195  --  185  --   --   --  182  --   APTT 83*  --   --   --   --   --   --   --   --   --   --   LABPROT 16.8*  --   --   --   --   --   --   --   --   --   --   INR 1.37  --   --   --   --   --   --   --   --   --   --   HEPARINUNFRC  --   --   --  1.27*   < >  --    < > 0.36 0.29*  --  0.18*  CREATININE  --  2.86*  --  3.29*  --   --   --   --   --  3.05*  --   TROPONINI  --   --  0.11*  --   --   --   --   --   --   --   --    < > = values in this interval not displayed.    Estimated Creatinine Clearance: 49.9 mL/min (A) (by C-G formula based on SCr of 3.05 mg/dL (H)).   Medical History: Past Medical History:  Diagnosis Date  . Allergy   . Anxiety   . Arrhythmia   . Arthritis   . Atrial fibrillation ([employer])   . CHF (congestive heart failure) ([place])   . CKD (chronic kidney disease)   . Diabetes mellitus without complication ([place])   . Diabetes mellitus, type II ([place])   . Heart failure ([place])   . Hyperlipidemia   . Hypertension   . Pancreatitis   . Sleep apnea     Medications:  Scheduled:  . chlorhexidine gluconate (MEDLINE KIT)  15 mL Mouth Rinse BID  . famotidine  20 mg Per Tube  Daily  . feeding supplement (PRO-STAT SUGAR FREE 64)  60 mL Per Tube TID  . free water  200 mL Per Tube Q8H  . insulin aspart  0-20 Units Subcutaneous Q4H  . insulin aspart  5 Units Subcutaneous Q4H  . insulin glargine  85 Units Subcutaneous BID  . mouth rinse  15 mL Mouth Rinse 10 times per day  . metoprolol tartrate  50 mg Per Tube BID  . multivitamin  15 mL Per Tube Daily  . sodium  chloride flush  10-40 mL Intracatheter Q12H   Infusions:  . diltiazem (CARDIZEM) infusion 10 mg/hr (04/21/18 0126)  . feeding supplement (VITAL HIGH PROTEIN) 1,000 mL (04/20/18 1627)  . fentaNYL infusion INTRAVENOUS 275 mcg/hr (04/21/18 1043)  . heparin 2,300 Units/hr (04/21/18 0945)  . linezolid (ZYVOX) IV Stopped (04/21/18 1046)    Assessment: Pharmacy consulted for warfarin and heparin drip management for 47 yo male admitted to the ICU s/p cardiac arrest. Patient has history significant for aortic valve replacement, atrial fibrillation and CHF. Head CT is negative for infarction or hemorrhage.   Patient takes warfarin 69m daily as an outpatient for goal INR 2.5-3.5.    Goal of Therapy:  INR 2-3 Heparin level 0.3-0.7 units/ml Monitor platelets by anticoagulation protocol: Yes   Plan:  Will bolus heparin 3600 units IV and increase infusion to 2300 unit/hr. Will recheck a HL in 6 hours.   CNapoleon Form PharmD, BCPS Clinical Pharmacist 04/21/2018 1:55 PM

## 2018-04-21 NOTE — Progress Notes (Signed)
[facility] for heparin drip management Indication: atrial fibrillation and aortic valve replacement   Allergies  Allergen Reactions  . Lexapro [Escitalopram Oxalate] Swelling    Patient Measurements: Height: 5' 10"  (177.8 cm) Weight: (!) 415 lb (188.2 kg) IBW/kg (Calculated) : 73 Heparin Dosing Weight: 111kg  Vital Signs: Temp: 98.7 F (37.1 C) (06/14 1600) Temp Source: Oral (06/14 1600) BP: 121/75 (06/14 1830) Pulse Rate: 96 (06/14 1830)  Labs: Recent Labs    04/19/18 0433  04/20/18 0306  04/20/18 2010 04/21/18 0440 04/21/18 0817 04/21/18 1843  HGB 12.2*  --  12.4*  --   --  12.5*  --   --   HCT 37.7*  --  37.8*  --   --  38.8*  --   --   PLT 195  --  185  --   --  182  --   --   HEPARINUNFRC 1.27*   < >  --    < > 0.29*  --  0.18* 0.39  CREATININE 3.29*  --   --   --   --  3.05*  --   --    < > = values in this interval not displayed.    Estimated Creatinine Clearance: 49.9 mL/min (A) (by C-G formula based on SCr of 3.05 mg/dL (H)).   Medical History: Past Medical History:  Diagnosis Date  . Allergy   . Anxiety   . Arrhythmia   . Arthritis   . Atrial fibrillation ([place])   . CHF (congestive heart failure) ([place])   . CKD (chronic kidney disease)   . Diabetes mellitus without complication ([place])   . Diabetes mellitus, type II ([place])   . Heart failure ([place])   . Hyperlipidemia   . Hypertension   . Pancreatitis   . Sleep apnea     Medications:  Scheduled:  . chlorhexidine gluconate (MEDLINE KIT)  15 mL Mouth Rinse BID  . famotidine  20 mg Per Tube Daily  . feeding supplement (PRO-STAT SUGAR FREE 64)  60 mL Per Tube TID  . free water  200 mL Per Tube Q8H  . insulin aspart  0-20 Units Subcutaneous Q4H  . insulin aspart  5 Units Subcutaneous Q4H  . insulin glargine  85 Units Subcutaneous BID  . mouth rinse  15 mL Mouth Rinse 10 times per day  . metoprolol tartrate  50 mg Per Tube BID  . multivitamin  15 mL Per Tube  Daily  . sodium chloride flush  10-40 mL Intracatheter Q12H   Infusions:  . diltiazem (CARDIZEM) infusion 7.5 mg/hr (04/21/18 1523)  . feeding supplement (VITAL HIGH PROTEIN) 1,000 mL (04/21/18 1629)  . fentaNYL infusion INTRAVENOUS 250 mcg/hr (04/21/18 1932)  . heparin 2,300 Units/hr (04/21/18 1905)  . linezolid (ZYVOX) IV Stopped (04/21/18 1046)    Assessment: Pharmacy consulted for warfarin and heparin drip management for 47 yo male admitted to the ICU s/p cardiac arrest. Patient has history significant for aortic valve replacement, atrial fibrillation and CHF. Head CT is negative for infarction or hemorrhage.   Patient takes warfarin 84m daily as an outpatient for goal INR 2.5-3.5.    Goal of Therapy:  INR 2-3 Heparin level 0.3-0.7 units/ml Monitor platelets by anticoagulation protocol: Yes   Plan:  Heparin level currently therapeutic at 0.39 IU/mL with drip running at 2300 unit/hr. Will maintain rate at 2300 units/hr and recheck a HL in 24 hours.   RDallie Piles PharmD Clinical Pharmacist 04/21/2018  7:34 PM

## 2018-04-21 NOTE — Care Management (Signed)
[facility] following.

## 2018-04-21 NOTE — Progress Notes (Addendum)
Inpatient Diabetes Program Recommendations  AACE/ADA: New Consensus Statement on Inpatient Glycemic Control (2019)  Target Ranges:  Prepandial:   less than 140 mg/dL      Peak postprandial:   less than 180 mg/dL (1-2 hours)      Critically ill patients:  140 - 180 mg/dL   Results for [name], [name] (MRN [id number]) as of 04/21/2018 08:39  Ref. Range 04/20/2018 07:20 04/20/2018 11:32 04/20/2018 16:27 04/20/2018 19:43 04/20/2018 23:44 04/21/2018 04:29 04/21/2018 07:48  Glucose-Capillary Latest Ref Range: 65 - 99 mg/dL 811161 (H) 914191 (H) 782201 (H) 200 (H) 257 (H) 292 (H) 222 (H)   Review of Glycemic Control  Diabetes history: DM2 Outpatient Diabetes medications: Levemir 65 units BID, Novolog 40 units TID with meals, Amaryl 2 mg daily Current orders for Inpatient glycemic control: Lantus 85 units BID, Novolog 0-20 units Q4H, Novolog 5 units Q4H  Inpatient Diabetes Program Recommendations:  Insulin - Basal: Noted Lantus increased to 85 units BID today. Insulin - Tube Feeding Coverage: Please consider increasing tube feeding coverage to Novolog 8 units Q4H.  Thanks, Orlando PennerMarie Maxen Rowland, RN, MSN, CDE Diabetes Coordinator Inpatient Diabetes Program [phone] (Team Pager from 8am to 5pm)

## 2018-04-21 NOTE — Progress Notes (Signed)
PULMONARY / CRITICAL CARE MEDICINE   Name: Alexander Alexander MRN: [id number] DOB: 10-11-1971    ADMISSION DATE:  04/07/2018  PT PROFILE:   55M with PMH of atrial fibrillation, status post ablation and aortic valve replacement on long term warfarin, CHF, CKD, diabetes, obstructive sleep apnea and hypertension, suffered a pulseless VT cardiac arrest in MRI when he was being evaluated for back pain and sciatica, subsequently intubated in the intensive care unit. Difficult airway noted  EVENTS/RESULTS: 06/11 CT chest: Dense RLL consolidation.  Segmental RML and LLL atelectasis.  Patchy GGO in upper lobes bilaterally 06/12 rough night with desaturation.  Transient bradycardia.  06/13 More responsive.  Gas exchange improving.  No further bradycardia.  Amiodarone has been stopped.  Now with AFRVR (up to 150/min).  Follows commands.  Remains on fentanyl infusion.  Propofol has been discontinued.   INDWELLING DEVICES:: R IJ CVL 05/31 >> 06/10 ETT 05/31 >>  RUE PICC 06/10 >>    MICRO DATA: MRSA PCR 05/31 >> NEG Urine 06/01 >> NEG Resp 06/01 >> MRSA Blood 06/01 >> NEG C diff 06/04 >> NEG  Urine 06/05 >> NEG Resp 06/05 >> MRSA Blood 06/05 >> NEG Resp 06/11 >> moderate S aureus (sens pending) >>   ANTIMICROBIALS:  Pip-tazo 05/31 >> 06/03 Vanc 05/31 >> 6/12 Cefepime 06/11 >>  Linezolid 06/12 >>   SUBJECTIVE:  RASS 0. More responsive.  Gas exchange improving.  No further bradycardia.  Amiodarone has been stopped.  Now with AFRVR (up to 150/min).  Follows commands.  Remains on fentanyl infusion.  Propofol has been discontinued.  VITAL SIGNS: BP 101/76   Pulse 96   Temp 98.1 F (36.7 C) (Oral)   Resp 10   Ht 5\' 10"  (1.778 m) Comment: based on previous notes  Wt (!) 415 lb (188.2 kg)   SpO2 90%   BMI 59.55 kg/m   HEMODYNAMICS:    VENTILATOR SETTINGS: Vent Mode: PCV FiO2 (%):  [50 %] 50 % Set Rate:  [16 bmp-30 bmp] 16 bmp Vt Set:  [400 mL] 400 mL PEEP:  [10 cmH20-12 cmH20] 10  cmH20 Plateau Pressure:  [14 cmH20-22 cmH20] 15 cmH20  INTAKE / OUTPUT: I/O last 3 completed shifts: In: 5566.5 [I.V.:2314.8; NG/GT:1338.3; IV Piggyback:1913.3] Out: 3990 [Urine:3940; Stool:50]  PHYSICAL EXAMINATION: General: NAD, follows commands Neuro: Moves all extremities, cranial nerves intact HEENT: WNL Cardiovascular: IRIR, tachy no M Lungs: No wheezes or other adventitious sounds anteriorly Abdomen: NT, nondistended, soft, diminished BS Ext: Mild symmetric LE edema Skin: No lesions noted  LABS:  BMET Recent Labs  Lab 04/18/18 1747 04/19/18 0433 04/21/18 0440  NA 139 143 143  K 4.2 4.3 4.7  CL 99* 104 105  CO2 26 27 30   BUN 78* 89* 96*  CREATININE 2.86* 3.29* 3.05*  GLUCOSE 279* 268* 264*    Electrolytes Recent Labs  Lab 04/16/18 1140 04/17/18 0404  04/18/18 1747 04/19/18 0433 04/21/18 0440  CALCIUM  --  8.6*   < > 7.9* 8.0* 9.0  MG 2.5* 2.5*  --  2.3  --   --   PHOS  --  3.4  --  5.3*  --   --    < > = values in this interval not displayed.    CBC Recent Labs  Lab 04/19/18 0433 04/20/18 0306 04/21/18 0440  WBC 17.4* 16.0* 17.1*  HGB 12.2* 12.4* 12.5*  HCT 37.7* 37.8* 38.8*  PLT 195 185 182    Coag's Recent Labs  Lab 04/18/18 1552  APTT 83*  INR 1.37    Sepsis Markers Recent Labs  Lab 04/19/18 0433 04/20/18 0306 04/21/18 0440  PROCALCITON 42.96 41.34 29.96    ABG Recent Labs  Lab 04/18/18 0555 04/18/18 1549 04/18/18 1952  PHART 7.38 7.21* 7.32*  PCO2ART 60* 61* 63*  PO2ART 54* 60* 72*    Liver Enzymes Recent Labs  Lab 04/18/18 0429 04/21/18 0440  AST 25 135*  ALT 21 166*  ALKPHOS 68 69  BILITOT 1.4* 1.7*  ALBUMIN 2.6* 2.5*    Cardiac Enzymes Recent Labs  Lab 04/18/18 1931  TROPONINI 0.11*    Glucose Recent Labs  Lab 04/20/18 0720 04/20/18 1132 04/20/18 1627 04/20/18 1943 04/20/18 2344 04/21/18 0429  GLUCAP 161* 191* 201* 200* 257* 292*    CXR: No new film   ASSESSMENT /  PLAN:  PULMONARY A: Respiratory arrest 05/31 Prolonged ventilator dependence Persistently high FiO2/PEEP requirements RLL consolidation P:   Cont vent support - settings reviewed and/or adjusted  Changed to PCV mode 6/12 to improve synchrony Cont vent bundle Daily SBT if/when meets criteria ENT consultation 6/10 - trach planned for 6/17 or 6/18  We will need to make sure that heparin is held Sunday night  CARDIOVASCULAR A:  VT cardiac arrest 5/31 (felt secondary to respiratory rest) Chronic atrial fibrillation with RVR Transient bradycardia 6/12 P:  MAP goal > 65 mmHg Continue heparin infusion Continue enteral metoprolol initiated 6/11 Amiodarone discontinued 6/12 Initiate diltiazem infusion 6/13  RENAL A:   CKD, baseline creatinine 2.0-2.5 Worsening AKI Hypervolemia P:   Monitor BMET intermittently Monitor I/Os Correct electrolytes as indicated  No further diuresis today  GASTROINTESTINAL A:   Abdominal distention, resolved P:   SUP: Enteral famotidine Resume TF protocol  HEMATOLOGIC A:   Very mild ICU acquired anemia P:  DVT px: heparin infusion (A. fib) Monitor CBC intermittently Transfuse per usual guidelines   INFECTIOUS A:   MRSA pneumonia Persistent fevers Severe sepsis P:   Monitor temp, WBC count Micro and abx as above  If only SA in resp culture, we can probably DC cefepime  ENDOCRINE A:   DM 2 with severe insulin resistance P:   Continue Lantus initiated 6/11, dose increased 6/12 Continue SSI, resistance scale Continue TF coverage Would consider holding Lantus dose on the evening prior to tracheostomy tube to avoid hypoglycemia  NEUROLOGIC A:   Acute encephalopathy, improved ICU/vent associated discomfort P:   RASS goal: 1, -2 Continue PAD protocol -fentanyl infusion Propofol discontinued 6/12   FAMILY  Wife updated at bedside   CCM time: 40 mins The above time includes time spent in consultation with patient and/or  family members and reviewing care plan on multidisciplinary rounds   Alexander KindredJohn Onyinyechi Huante, DO   Patient ID: Alexander Alexander, male   DOB: [birth year], 47 y.o.   MRN: [id number]

## 2018-04-21 NOTE — Progress Notes (Signed)
RN asked Dr. Lonn Georgiaonforti about need for restraint order when only patient mitts are being used to prevent patient from pulling at ET tube.  MD gave order to d/c restraint order.

## 2018-04-21 NOTE — Progress Notes (Signed)
[employer] [facility] - [place] at [facility]   PATIENT NAME: Alexander Alexander    MR#:  [id number]  DATE OF BIRTH:  [birth year]  SUBJECTIVE:  CHIEF COMPLAINT:   Chief Complaint  Patient presents with  . Back Pain  Patient remains critically ill, difficult vent weaning, for tracheostomy early next week  REVIEW OF SYSTEMS:  CONSTITUTIONAL: No fever, fatigue or weakness.  EYES: No blurred or double vision.  EARS, NOSE, AND THROAT: No tinnitus or ear pain.  RESPIRATORY: No cough, shortness of breath, wheezing or hemoptysis.  CARDIOVASCULAR: No chest pain, orthopnea, edema.  GASTROINTESTINAL: No nausea, vomiting, diarrhea or abdominal pain.  GENITOURINARY: No dysuria, hematuria.  ENDOCRINE: No polyuria, nocturia,  HEMATOLOGY: No anemia, easy bruising or bleeding SKIN: No rash or lesion. MUSCULOSKELETAL: No joint pain or arthritis.   NEUROLOGIC: No tingling, numbness, weakness.  PSYCHIATRY: No anxiety or depression.   ROS  DRUG ALLERGIES:   Allergies  Allergen Reactions  . Lexapro [Escitalopram Oxalate] Swelling    VITALS:  Blood pressure 104/60, pulse 89, temperature 98.7 F (37.1 C), temperature source Oral, resp. rate 13, height 5\' 10"  (1.778 m), weight (!) 188.2 kg (415 lb), SpO2 90 %.  PHYSICAL EXAMINATION:  GENERAL:  47 y.o.-year-old patient lying in the bed with no acute distress.  EYES: Pupils equal, round, reactive to light and accommodation. No scleral icterus. Extraocular muscles intact.  HEENT: Head atraumatic, normocephalic. Oropharynx and nasopharynx clear.  NECK:  Supple, no jugular venous distention. No thyroid enlargement, no tenderness.  LUNGS: Normal breath sounds bilaterally, no wheezing, rales,rhonchi or crepitation. No use of accessory muscles of respiration.  CARDIOVASCULAR: S1, S2 normal. No murmurs, rubs, or gallops.  ABDOMEN: Soft, nontender, nondistended. Bowel sounds present. No organomegaly or mass.  EXTREMITIES: No pedal edema, cyanosis, or  clubbing.  NEUROLOGIC: Cranial nerves II through XII are intact. Muscle strength 5/5 in all extremities. Sensation intact. Gait not checked.  PSYCHIATRIC: The patient is alert and oriented x 3.  SKIN: No obvious rash, lesion, or ulcer.   Physical Exam LABORATORY PANEL:   CBC Recent Labs  Lab 04/21/18 0440  WBC 17.1*  HGB 12.5*  HCT 38.8*  PLT 182   ------------------------------------------------------------------------------------------------------------------  Chemistries  Recent Labs  Lab 04/18/18 1747  04/21/18 0440  NA 139   < > 143  K 4.2   < > 4.7  CL 99*   < > 105  CO2 26   < > 30  GLUCOSE 279*   < > 264*  BUN 78*   < > 96*  CREATININE 2.86*   < > 3.05*  CALCIUM 7.9*   < > 9.0  MG 2.3  --   --   AST  --   --  135*  ALT  --   --  166*  ALKPHOS  --   --  69  BILITOT  --   --  1.7*   < > = values in this interval not displayed.   ------------------------------------------------------------------------------------------------------------------  Cardiac Enzymes Recent Labs  Lab 04/18/18 1931  TROPONINI 0.11*   ------------------------------------------------------------------------------------------------------------------  RADIOLOGY:  Dg Chest Port 1 View  Result Date: 04/21/2018 CLINICAL DATA:  Respiratory failure EXAM: PORTABLE CHEST 1 VIEW COMPARISON:  04/19/2018 FINDINGS: Cardiac shadow is enlarged but stable. Endotracheal tube and nasogastric catheter are noted and stable. The overall inspiratory effort is again poor. Mild right basilar infiltrative density is seen. Similar changes are noted in the left lung base as well. Mild central vascular congestion is noted. The  right-sided pleural effusion is less well visualized likely related to patient positioning. IMPRESSION: Vascular congestion and bibasilar atelectatic changes. Electronically Signed   By: Alcide CleverMark  Lukens M.D.   On: 04/21/2018 06:38    ASSESSMENT AND PLAN:  47 year old male with past medical  history significant for A. fib status post ablation, aortic valve replacement on warfarin, history of congestive heart failure, CKD, morbid obesity, diabetes and sleep apnea who was getting an outpatient MRI of his lower back had a cardiac arrest.  *Acute cardiopulmonary arrest From respiratory causes. Difficult weaning from vent, in d/w intensivist-plans for possible tracheostomy next week, continue tube feeds, adult pain protocol  *AcuteMRSA/VAP Continue Linezolid, vent protocol  *Atrial fibrillation with RVR Stable Continue metoprolol, amiodarone, heparin drip  *History of mechanical aortic valve replacement Stable On heparin drip  *Acute toxic metabolic encephalopathy  Neurology input appreciated  Continue Kepprafor possible partial seizures  *Chronic diabetes mellitus type 2 Stable on current regiment  *acute renal failurew/ CKD Secondary toATN  *Acute septic shock Resolved Empiric antibiotics as stated above   All the records are reviewed and case discussed with Care Management/Social Workerr. Management plans discussed with the patient, family and they are in agreement.  CODE STATUS: full  TOTAL TIME TAKING CARE OF THIS PATIENT: 35 minutes.     POSSIBLE D/C IN 5 DAYS, DEPENDING ON CLINICAL CONDITION.   Evelena AsaMontell D Salary M.D on 04/21/2018   Between 7am to 6pm - Pager - [phone]  After 6pm go to [email or web address] - password [email or web address]  [facility] [place] [facility]  Office  [phone]  CC: Primary care physician; Erasmo DownerBacigalupo, Angela M, MD  Note: This dictation was prepared with Dragon dictation along with smaller phrase technology. Any transcriptional errors that result from this process are unintentional.

## 2018-04-22 LAB — BASIC METABOLIC PANEL
ANION GAP: 8 (ref 5–15)
BUN: 98 mg/dL — ABNORMAL HIGH (ref 6–20)
CALCIUM: 8.9 mg/dL (ref 8.9–10.3)
CO2: 28 mmol/L (ref 22–32)
CREATININE: 3.03 mg/dL — AB (ref 0.61–1.24)
Chloride: 108 mmol/L (ref 101–111)
GFR, EST AFRICAN AMERICAN: 26 mL/min — AB (ref >60)
GFR, EST NON AFRICAN AMERICAN: 23 mL/min — AB (ref >60)
Glucose, Bld: 178 mg/dL — ABNORMAL HIGH (ref 65–99)
Potassium: 4.4 mmol/L (ref 3.5–5.1)
SODIUM: 144 mmol/L (ref 135–145)

## 2018-04-22 LAB — BLOOD GAS, VENOUS
ACID-BASE DEFICIT: 4.2 mmol/L — AB (ref 0.0–2.0)
Bicarbonate: 27 mmol/L (ref 20.0–28.0)
FIO2: 1
LHR: 30 {breaths}/min
PEEP/CPAP: 14 cmH2O
PH VEN: 7.11 — AB (ref 7.250–7.430)
Patient temperature: 37
VT: 400 mL
pCO2, Ven: 85 mmHg (ref 44.0–60.0)

## 2018-04-22 LAB — HEPARIN LEVEL (UNFRACTIONATED): HEPARIN UNFRACTIONATED: 0.53 [IU]/mL (ref 0.30–0.70)

## 2018-04-22 LAB — GLUCOSE, CAPILLARY
GLUCOSE-CAPILLARY: 179 mg/dL — AB (ref 65–99)
GLUCOSE-CAPILLARY: 154 mg/dL — AB (ref 65–99)
Glucose-Capillary: 169 mg/dL — ABNORMAL HIGH (ref 65–99)
Glucose-Capillary: 217 mg/dL — ABNORMAL HIGH (ref 65–99)
Glucose-Capillary: 194 mg/dL — ABNORMAL HIGH (ref 65–99)
Glucose-Capillary: 191 mg/dL — ABNORMAL HIGH (ref 65–99)
Glucose-Capillary: 232 mg/dL — ABNORMAL HIGH (ref 65–99)

## 2018-04-22 LAB — BLOOD GAS, ARTERIAL
ACID-BASE EXCESS: 4.3 mmol/L — AB (ref 0.0–2.0)
Bicarbonate: 33.8 mmol/L — ABNORMAL HIGH (ref 20.0–28.0)
FIO2: 0.55
O2 Saturation: 90.3 %
PEEP: 10 cmH2O
PH ART: 7.25 — AB (ref 7.350–7.450)
Patient temperature: 37
Pressure control: 18 cmH2O
RATE: 16 resp/min
pCO2 arterial: 77 mmHg (ref 32.0–48.0)
pO2, Arterial: 69 mmHg — ABNORMAL LOW (ref 83.0–108.0)

## 2018-04-22 LAB — CBC
HCT: 36.8 % — ABNORMAL LOW (ref 40.0–52.0)
Hemoglobin: 11.8 g/dL — ABNORMAL LOW (ref 13.0–18.0)
MCH: 27.8 pg (ref 26.0–34.0)
MCHC: 32 g/dL (ref 32.0–36.0)
MCV: 87 fL (ref 80.0–100.0)
Platelets: 185 10*3/uL (ref 150–440)
RBC: 4.23 MIL/uL — AB (ref 4.40–5.90)
RDW: 18.4 % — AB (ref 11.5–14.5)
WBC: 15.9 10*3/uL — AB (ref 3.8–10.6)

## 2018-04-22 LAB — PROCALCITONIN: PROCALCITONIN: 21.8 ng/mL

## 2018-04-22 LAB — MAGNESIUM: Magnesium: 2.9 mg/dL — ABNORMAL HIGH (ref 1.7–2.4)

## 2018-04-22 LAB — PHOSPHORUS: PHOSPHORUS: 3.9 mg/dL (ref 2.5–4.6)

## 2018-04-22 NOTE — Progress Notes (Signed)
[place] for heparin drip management Indication: atrial fibrillation and aortic valve replacement   Allergies  Allergen Reactions  . Lexapro [Escitalopram Oxalate] Swelling    Patient Measurements: Height: 5' 10"  (177.8 cm) Weight: (!) 414 lb 11 oz (188.1 kg) IBW/kg (Calculated) : 73 Heparin Dosing Weight: 111kg  Vital Signs: Temp: 98.7 F (37.1 C) (06/15 1200) Temp Source: Oral (06/15 1200) BP: 115/71 (06/15 1400) Pulse Rate: 87 (06/15 1400)  Labs: Recent Labs    04/20/18 0306  04/21/18 0440 04/21/18 0817 04/21/18 1843 04/22/18 0527 04/22/18 1425  HGB 12.4*  --  12.5*  --   --  11.8*  --   HCT 37.8*  --  38.8*  --   --  36.8*  --   PLT 185  --  182  --   --  185  --   HEPARINUNFRC  --    < >  --  0.18* 0.39  --  0.53  CREATININE  --   --  3.05*  --   --  3.03*  --    < > = values in this interval not displayed.    Estimated Creatinine Clearance: 50.2 mL/min (A) (by C-G formula based on SCr of 3.03 mg/dL (H)).   Medical History: Past Medical History:  Diagnosis Date  . Allergy   . Anxiety   . Arrhythmia   . Arthritis   . Atrial fibrillation ([place])   . CHF (congestive heart failure) ([place])   . CKD (chronic kidney disease)   . Diabetes mellitus without complication ([place])   . Diabetes mellitus, type II ([place])   . Heart failure ([place])   . Hyperlipidemia   . Hypertension   . Pancreatitis   . Sleep apnea     Medications:  Scheduled:  . chlorhexidine gluconate (MEDLINE KIT)  15 mL Mouth Rinse BID  . famotidine  20 mg Per Tube Daily  . feeding supplement (PRO-STAT SUGAR FREE 64)  60 mL Per Tube TID  . free water  200 mL Per Tube Q8H  . insulin aspart  0-20 Units Subcutaneous Q4H  . insulin aspart  5 Units Subcutaneous Q4H  . insulin glargine  85 Units Subcutaneous BID  . mouth rinse  15 mL Mouth Rinse 10 times per day  . metoprolol tartrate  50 mg Per Tube BID  . multivitamin  15 mL Per Tube Daily  . sodium chloride  flush  10-40 mL Intracatheter Q12H   Infusions:  . diltiazem (CARDIZEM) infusion 7.5 mg/hr (04/22/18 1148)  . feeding supplement (VITAL HIGH PROTEIN) 1,000 mL (04/22/18 1418)  . fentaNYL infusion INTRAVENOUS 300 mcg/hr (04/22/18 1146)  . heparin 2,300 Units/hr (04/22/18 1200)  . linezolid (ZYVOX) IV Stopped (04/22/18 1320)    Assessment: Pharmacy consulted for warfarin and heparin drip management for 47 yo male admitted to the ICU s/p cardiac arrest. Patient has history significant for aortic valve replacement, atrial fibrillation and CHF. Head CT is negative for infarction or hemorrhage.   Patient takes warfarin 49m daily as an outpatient for goal INR 2.5-3.5.    Goal of Therapy:  INR 2-3 Heparin level 0.3-0.7 units/ml Monitor platelets by anticoagulation protocol: Yes   Plan:  Continue infusion at current rate and f/u AM labs.   CUlice DashD, PharmD Clinical Pharmacist 04/22/2018 3:07 PM

## 2018-04-22 NOTE — Progress Notes (Signed)
PULMONARY / CRITICAL CARE MEDICINE   Name: Jayesh Marbach MRN: [id number] DOB: [birth year]    ADMISSION DATE:  04/07/2018  PT PROFILE:   51M with PMH of atrial fibrillation, status post ablation and aortic valve replacement on long term warfarin, CHF, CKD, diabetes, obstructive sleep apnea and hypertension, suffered a pulseless VT cardiac arrest in MRI when he was being evaluated for back pain and sciatica, subsequently intubated in the intensive care unit. Difficult airway noted  EVENTS/RESULTS: 06/11 CT chest: Dense RLL consolidation.  Segmental RML and LLL atelectasis.  Patchy GGO in upper lobes bilaterally 06/12 rough night with desaturation.  Transient bradycardia.  06/13 More responsive.  Gas exchange improving.  No further bradycardia.  Amiodarone has been stopped.  Now with AFRVR (up to 150/min).  Follows commands.  Remains on fentanyl infusion.  Propofol has been discontinued.   INDWELLING DEVICES:: R IJ CVL 05/31 >> 06/10 ETT 05/31 >>  RUE PICC 06/10 >>    MICRO DATA: MRSA PCR 05/31 >> NEG Urine 06/01 >> NEG Resp 06/01 >> MRSA Blood 06/01 >> NEG C diff 06/04 >> NEG  Urine 06/05 >> NEG Resp 06/05 >> MRSA Blood 06/05 >> NEG Resp 06/11 >> moderate S aureus (sens pending) >>   ANTIMICROBIALS:  Pip-tazo 05/31 >> 06/03 Vanc 05/31 >> 6/12 Cefepime 06/11 >>  Linezolid 06/12 >>   SUBJECTIVE:  No further bradycardia.  Amiodarone has been stopped.  Now with AFRVR (up to 150/min).  Follows commands.  Remains on fentanyl infusion.    VITAL SIGNS: BP 108/68   Pulse 87   Temp 98.8 F (37.1 C) (Oral)   Resp 18   Ht 5\' 10"  (1.778 m)   Wt (!) 414 lb 11 oz (188.1 kg)   SpO2 91%   BMI 59.50 kg/m   HEMODYNAMICS:    VENTILATOR SETTINGS: Vent Mode: PCV FiO2 (%):  [50 %-55 %] 55 % Set Rate:  [16 bmp] 16 bmp PEEP:  [10 cmH20] 10 cmH20 Plateau Pressure:  [22 cmH20-27 cmH20] 22 cmH20  INTAKE / OUTPUT: I/O last 3 completed shifts: In: 6258.3 [I.V.:2524.1; NG/GT:2454.2;  IV Piggyback:1280] Out: 4360 [Urine:4210; Stool:150]  PHYSICAL EXAMINATION: General: NAD, on mechanical ventilation Neuro: Moves all extremities, cranial nerves intact HEENT: WNL Cardiovascular: IRIR, tachy no M Lungs: No wheezes or other adventitious sounds anteriorly Abdomen: NT, nondistended, soft, diminished BS Ext: Mild symmetric LE edema Skin: No lesions noted  LABS:  BMET Recent Labs  Lab 04/19/18 0433 04/21/18 0440 04/22/18 0527  NA 143 143 144  K 4.3 4.7 4.4  CL 104 105 108  CO2 27 30 28   BUN 89* 96* 98*  CREATININE 3.29* 3.05* 3.03*  GLUCOSE 268* 264* 178*    Electrolytes Recent Labs  Lab 04/17/18 0404  04/18/18 1747 04/19/18 0433 04/21/18 0440 04/22/18 0527  CALCIUM 8.6*   < > 7.9* 8.0* 9.0 8.9  MG 2.5*  --  2.3  --   --  2.9*  PHOS 3.4  --  5.3*  --   --  3.9   < > = values in this interval not displayed.    CBC Recent Labs  Lab 04/20/18 0306 04/21/18 0440 04/22/18 0527  WBC 16.0* 17.1* 15.9*  HGB 12.4* 12.5* 11.8*  HCT 37.8* 38.8* 36.8*  PLT 185 182 185    Coag's Recent Labs  Lab 04/18/18 1552  APTT 83*  INR 1.37    Sepsis Markers Recent Labs  Lab 04/20/18 0306 04/21/18 0440 04/22/18 0527  PROCALCITON 41.34 29.96  21.80    ABG Recent Labs  Lab 04/18/18 0555 04/18/18 1549 04/18/18 1952  PHART 7.38 7.21* 7.32*  PCO2ART 60* 61* 63*  PO2ART 54* 60* 72*    Liver Enzymes Recent Labs  Lab 04/18/18 0429 04/21/18 0440  AST 25 135*  ALT 21 166*  ALKPHOS 68 69  BILITOT 1.4* 1.7*  ALBUMIN 2.6* 2.5*    Cardiac Enzymes Recent Labs  Lab 04/18/18 1931  TROPONINI 0.11*    Glucose Recent Labs  Lab 04/21/18 1115 04/21/18 1612 04/21/18 1921 04/21/18 2324 04/22/18 0324 04/22/18 0714  GLUCAP 229* 213* 207* 214* 179* 169*    CXR: No new film   ASSESSMENT / PLAN:  PULMONARY A: Respiratory arrest 05/31, Prolonged ventilator dependence Persistently high FiO2/PEEP requirements, RLL consolidation on linezolid,  pending tracheostomy on Tuesday  CARDIOVASCULAR A:  VT cardiac arrest 5/31 (felt secondary to respiratory rest) Chronic atrial fibrillation with RVR Transient bradycardia 6/12, presently on Cardizem infusion with controlled ventricular response along with anticoagulation  RENAL A:   CKD, BUN/creatinine have appeared to of plateaued 98/3.03, continue to support hemodynamics will hold on further diuresis at this point, no indications for acute hemodialysis  ENDOCRINE A:   DM 2 with severe insulin resistance, Sliding scale being adjusted, Has been on and off of insulin infusion   FAMILY  Wife updated at bedside   CCM time: 35 mins The above time includes time spent in consultation with patient and/or family members and reviewing care plan on multidisciplinary rounds   Tora KindredJohn Quinette Hentges, DO   Patient ID: Pricilla RiffleOtis Leon Candelas, male   DOB: [birth year], 47 y.o.   MRN: [id number] Patient ID: Pricilla RiffleOtis Leon Ates, male   DOB: [birth year], 47 y.o.   MRN: [id number]

## 2018-04-22 NOTE — Progress Notes (Signed)
[employer] - [place] at [facility]   PATIENT NAME: Alexander HeadyOtis Alexander    MR#:  [id number]  DATE OF BIRTH:  02-12-1971  SUBJECTIVE:  CHIEF COMPLAINT:   Chief Complaint  Patient presents with  . Back Pain  Patient remains critically ill and discussion with intensivist, plans for tracheostomy possible PEG next week if can be arranged together  REVIEW OF SYSTEMS:  CONSTITUTIONAL: No fever, fatigue or weakness.  EYES: No blurred or double vision.  EARS, NOSE, AND THROAT: No tinnitus or ear pain.  RESPIRATORY: No cough, shortness of breath, wheezing or hemoptysis.  CARDIOVASCULAR: No chest pain, orthopnea, edema.  GASTROINTESTINAL: No nausea, vomiting, diarrhea or abdominal pain.  GENITOURINARY: No dysuria, hematuria.  ENDOCRINE: No polyuria, nocturia,  HEMATOLOGY: No anemia, easy bruising or bleeding SKIN: No rash or lesion. MUSCULOSKELETAL: No joint pain or arthritis.   NEUROLOGIC: No tingling, numbness, weakness.  PSYCHIATRY: No anxiety or depression.   ROS  DRUG ALLERGIES:   Allergies  Allergen Reactions  . Lexapro [Escitalopram Oxalate] Swelling    VITALS:  Blood pressure 138/83, pulse (!) 103, temperature 98.7 F (37.1 C), temperature source Oral, resp. rate (!) 21, height 5\' 10"  (1.778 m), weight (!) 188.1 kg (414 lb 11 oz), SpO2 95 %.  PHYSICAL EXAMINATION:  GENERAL:  47 y.o.-year-old patient lying in the bed with no acute distress.  EYES: Pupils equal, round, reactive to light and accommodation. No scleral icterus. Extraocular muscles intact.  HEENT: Head atraumatic, normocephalic. Oropharynx and nasopharynx clear.  NECK:  Supple, no jugular venous distention. No thyroid enlargement, no tenderness.  LUNGS: Normal breath sounds bilaterally, no wheezing, rales,rhonchi or crepitation. No use of accessory muscles of respiration.  CARDIOVASCULAR: S1, S2 normal. No murmurs, rubs, or gallops.  ABDOMEN: Soft, nontender, nondistended. Bowel sounds present. No  organomegaly or mass.  EXTREMITIES: No pedal edema, cyanosis, or clubbing.  NEUROLOGIC: Cranial nerves II through XII are intact. Muscle strength 5/5 in all extremities. Sensation intact. Gait not checked.  PSYCHIATRIC: The patient is alert and oriented x 3.  SKIN: No obvious rash, lesion, or ulcer.   Physical Exam LABORATORY PANEL:   CBC Recent Labs  Lab 04/22/18 0527  WBC 15.9*  HGB 11.8*  HCT 36.8*  PLT 185   ------------------------------------------------------------------------------------------------------------------  Chemistries  Recent Labs  Lab 04/21/18 0440 04/22/18 0527  NA 143 144  K 4.7 4.4  CL 105 108  CO2 30 28  GLUCOSE 264* 178*  BUN 96* 98*  CREATININE 3.05* 3.03*  CALCIUM 9.0 8.9  MG  --  2.9*  AST 135*  --   ALT 166*  --   ALKPHOS 69  --   BILITOT 1.7*  --    ------------------------------------------------------------------------------------------------------------------  Cardiac Enzymes Recent Labs  Lab 04/18/18 1931  TROPONINI 0.11*   ------------------------------------------------------------------------------------------------------------------  RADIOLOGY:  Dg Chest Port 1 View  Result Date: 04/21/2018 CLINICAL DATA:  Respiratory failure EXAM: PORTABLE CHEST 1 VIEW COMPARISON:  04/19/2018 FINDINGS: Cardiac shadow is enlarged but stable. Endotracheal tube and nasogastric catheter are noted and stable. The overall inspiratory effort is again poor. Mild right basilar infiltrative density is seen. Similar changes are noted in the left lung base as well. Mild central vascular congestion is noted. The right-sided pleural effusion is less well visualized likely related to patient positioning. IMPRESSION: Vascular congestion and bibasilar atelectatic changes. Electronically Signed   By: Alcide CleverMark  Lukens M.D.   On: 04/21/2018 06:38    ASSESSMENT AND PLAN:  47 year old male with past medical history significant  for A. fib status post ablation,  aortic valve replacement on warfarin, history of congestive heart failure, CKD, morbid obesity, diabetes and sleep apnea who was getting an outpatient MRI of his lower back had a cardiac arrest.  *Acute cardiopulmonary arrest From respiratory causes. Difficult weaning from vent,in d/w intensivist-plans for possible tracheostomy next week, continue tube feeds, adult pain protocol  *AcuteMRSA/VAP Continue Linezolid, vent protocol  *Atrial fibrillationwith RVR Stable Continuemetoprolol,amiodarone, heparin drip  *History of mechanical aortic valve replacement Stable Onheparin drip  *Acute toxic metabolic encephalopathy  Neurology input appreciated  Continue Kepprafor possible partial seizures  *Chronic diabetes mellitus type 2 Stable on current regiment  *acute renal failurew/ CKD Secondary toATN  *Acute septic shock Resolved Empiric antibiotics as stated above   all the records are reviewed and case discussed with Care Management/Social Workerr. Management plans discussed with the patient, family and they are in agreement.  CODE STATUS: full  TOTAL TIME TAKING CARE OF THIS PATIENT: 35 minutes.     POSSIBLE D/C IN 5 DAYS, DEPENDING ON CLINICAL CONDITION.   Evelena Asa Tamina Cyphers M.D on 04/22/2018   Between 7am to 6pm - Pager - [phone]  After 6pm go to [email or web address] - password [employer]  [facility] [place] [facility]  Office  [phone]  CC: Primary care physician; Erasmo Downer, MD  Note: This dictation was prepared with Dragon dictation along with smaller phrase technology. Any transcriptional errors that result from this process are unintentional.

## 2018-04-22 NOTE — Progress Notes (Signed)
Fio2 to 55%  D/t sat below 90 %

## 2018-04-23 ENCOUNTER — Inpatient Hospital Stay: Payer: Medicare HMO

## 2018-04-23 LAB — GLUCOSE, CAPILLARY
GLUCOSE-CAPILLARY: 157 mg/dL — AB (ref 65–99)
GLUCOSE-CAPILLARY: 173 mg/dL — AB (ref 65–99)
Glucose-Capillary: 204 mg/dL — ABNORMAL HIGH (ref 65–99)
Glucose-Capillary: 175 mg/dL — ABNORMAL HIGH (ref 65–99)
Glucose-Capillary: 219 mg/dL — ABNORMAL HIGH (ref 65–99)
Glucose-Capillary: 151 mg/dL — ABNORMAL HIGH (ref 65–99)

## 2018-04-23 LAB — BLOOD GAS, ARTERIAL
Acid-Base Excess: 3.9 mmol/L — ABNORMAL HIGH (ref 0.0–2.0)
Bicarbonate: 34.4 mmol/L — ABNORMAL HIGH (ref 20.0–28.0)
FIO2: 0.55
Mechanical Rate: 22
O2 SAT: 83.5 %
PCO2 ART: 84 mmHg — AB (ref 32.0–48.0)
PEEP/CPAP: 10 cmH2O
PH ART: 7.22 — AB (ref 7.350–7.450)
Patient temperature: 37
Pressure control: 18 cmH2O
pO2, Arterial: 58 mmHg — ABNORMAL LOW (ref 83.0–108.0)

## 2018-04-23 LAB — CBC
HCT: 37.8 % — ABNORMAL LOW (ref 40.0–52.0)
Hemoglobin: 12.2 g/dL — ABNORMAL LOW (ref 13.0–18.0)
MCH: 28 pg (ref 26.0–34.0)
MCHC: 32.2 g/dL (ref 32.0–36.0)
MCV: 87 fL (ref 80.0–100.0)
PLATELETS: 196 10*3/uL (ref 150–440)
RBC: 4.34 MIL/uL — ABNORMAL LOW (ref 4.40–5.90)
RDW: 18.6 % — AB (ref 11.5–14.5)
WBC: 14.7 10*3/uL — ABNORMAL HIGH (ref 3.8–10.6)

## 2018-04-23 LAB — HEPARIN LEVEL (UNFRACTIONATED): HEPARIN UNFRACTIONATED: 0.54 [IU]/mL (ref 0.30–0.70)

## 2018-04-23 MED ORDER — IPRATROPIUM-ALBUTEROL 0.5-2.5 (3) MG/3ML IN SOLN
RESPIRATORY_TRACT | Status: AC
  Filled 2018-04-23: qty 3

## 2018-04-23 MED ORDER — IPRATROPIUM-ALBUTEROL 0.5-2.5 (3) MG/3ML IN SOLN
3.0000 mL | Freq: Four times a day (QID) | RESPIRATORY_TRACT | Status: DC
  Administered 2018-04-23 – 2018-05-21 (×110): 3 mL via RESPIRATORY_TRACT
  Filled 2018-04-23 (×110): qty 3

## 2018-04-23 NOTE — Progress Notes (Signed)
[employer] - [place] at [facility]   PATIENT NAME: Alexander Alexander    MR#:  [id number]  DATE OF BIRTH:  1971/07/12  SUBJECTIVE:  CHIEF COMPLAINT:   Chief Complaint  Patient presents with  . Back Pain  Patient remains critically ill, difficult weaning from vent, for trach and possible PEG later this week  REVIEW OF SYSTEMS:  CONSTITUTIONAL: No fever, fatigue or weakness.  EYES: No blurred or double vision.  EARS, NOSE, AND THROAT: No tinnitus or ear pain.  RESPIRATORY: No cough, shortness of breath, wheezing or hemoptysis.  CARDIOVASCULAR: No chest pain, orthopnea, edema.  GASTROINTESTINAL: No nausea, vomiting, diarrhea or abdominal pain.  GENITOURINARY: No dysuria, hematuria.  ENDOCRINE: No polyuria, nocturia,  HEMATOLOGY: No anemia, easy bruising or bleeding SKIN: No rash or lesion. MUSCULOSKELETAL: No joint pain or arthritis.   NEUROLOGIC: No tingling, numbness, weakness.  PSYCHIATRY: No anxiety or depression.   ROS  DRUG ALLERGIES:   Allergies  Allergen Reactions  . Lexapro [Escitalopram Oxalate] Swelling    VITALS:  Blood pressure 128/79, pulse 99, temperature 99.7 F (37.6 C), temperature source Oral, resp. rate (!) 33, height 5\' 10"  (1.778 m), weight (!) 189.1 kg (417 lb), SpO2 (!) 89 %.  PHYSICAL EXAMINATION:  GENERAL:  47 y.o.-year-old patient lying in the bed with no acute distress.  EYES: Pupils equal, round, reactive to light and accommodation. No scleral icterus. Extraocular muscles intact.  HEENT: Head atraumatic, normocephalic. Oropharynx and nasopharynx clear.  NECK:  Supple, no jugular venous distention. No thyroid enlargement, no tenderness.  LUNGS: Normal breath sounds bilaterally, no wheezing, rales,rhonchi or crepitation. No use of accessory muscles of respiration.  CARDIOVASCULAR: S1, S2 normal. No murmurs, rubs, or gallops.  ABDOMEN: Soft, nontender, nondistended. Bowel sounds present. No organomegaly or mass.  EXTREMITIES: No pedal  edema, cyanosis, or clubbing.  NEUROLOGIC: Cranial nerves II through XII are intact. Muscle strength 5/5 in all extremities. Sensation intact. Gait not checked.  PSYCHIATRIC: The patient is alert and oriented x 3.  SKIN: No obvious rash, lesion, or ulcer.   Physical Exam LABORATORY PANEL:   CBC Recent Labs  Lab 04/23/18 0429  WBC 14.7*  HGB 12.2*  HCT 37.8*  PLT 196   ------------------------------------------------------------------------------------------------------------------  Chemistries  Recent Labs  Lab 04/21/18 0440 04/22/18 0527  NA 143 144  K 4.7 4.4  CL 105 108  CO2 30 28  GLUCOSE 264* 178*  BUN 96* 98*  CREATININE 3.05* 3.03*  CALCIUM 9.0 8.9  MG  --  2.9*  AST 135*  --   ALT 166*  --   ALKPHOS 69  --   BILITOT 1.7*  --    ------------------------------------------------------------------------------------------------------------------  Cardiac Enzymes Recent Labs  Lab 04/18/18 1931  TROPONINI 0.11*   ------------------------------------------------------------------------------------------------------------------  RADIOLOGY:  Dg Chest Port 1 View  Result Date: 04/23/2018 CLINICAL DATA:  Patient status post respiratory arrest 04/07/2018. EXAM: PORTABLE CHEST 1 VIEW COMPARISON:  Single-view of the chest 04/21/2018 and 04/19/2018. FINDINGS: Endotracheal tube remains in place in good position. There is cardiomegaly and pulmonary edema. No pneumothorax. Likely small to moderate bilateral pleural effusions. IMPRESSION: No change in cardiomegaly and pulmonary edema since the most recent exam. Electronically Signed   By: Drusilla Kannerhomas  Dalessio M.D.   On: 04/23/2018 10:22    ASSESSMENT AND PLAN:  47 year old male with past medical history significant for A. fib status post ablation, aortic valve replacement on warfarin, history of congestive heart failure, CKD, morbid obesity, diabetes and sleep apnea who was getting  an outpatient MRI of his lower back had a  cardiac arrest.  *Acute cardiopulmonary arrest From respiratory causes. Difficult weaning from vent,in d/w intensivist-plans for possible tracheostomy latter this week, continue tube feeds, adult pain protocol  *AcuteMRSA/VAP ContinueLinezolid,vent protocol  *Atrial fibrillationwith RVR Stable Continuemetoprolol,amiodarone, heparin drip  *History of mechanical aortic valve replacement Stable Onheparin drip  *Acute toxic metabolic encephalopathy  Neurology input appreciated  Continue Kepprafor possible partial seizures  *Chronic diabetes mellitus type 2 Stable on current regiment  *acute renal failurew/ CKD Secondary toATN  *Acute septic shock Resolved Empiric antibiotics as stated above  All the records are reviewed and case discussed with Care Management/Social Workerr. Management plans discussed with the patient, family and they are in agreement.  CODE STATUS: full TOTAL TIME TAKING CARE OF THIS PATIENT: 35 minutes.     POSSIBLE D/C IN 5 DAYS, DEPENDING ON CLINICAL CONDITION.   Evelena Asa Salary M.D on 04/23/2018   Between 7am to 6pm - Pager - [phone]  After 6pm go to [email or web address] - password [email or web address]  [facility]  Office  [phone]  CC: Primary care physician; Erasmo Downer, MD  Note: This dictation was prepared with Dragon dictation along with smaller phrase technology. Any transcriptional errors that result from this process are unintentional.

## 2018-04-23 NOTE — Progress Notes (Signed)
[place] for heparin drip management Indication: atrial fibrillation and aortic valve replacement   Allergies  Allergen Reactions  . Lexapro [Escitalopram Oxalate] Swelling    Patient Measurements: Height: _0  (177.8 cm) Weight: (!) 417 lb (189.1 kg) IBW/kg (Calculated) : 73 Heparin Dosing Weight: 111kg  Vital Signs: BP: 110/74 (06/16 0000) Pulse Rate: 87 (06/16 0000)  Labs: Recent Labs    04/21/18 0440  04/21/18 1843 04/22/18 0527 04/22/18 1425 04/23/18 0429  HGB 12.5*  --   --  11.8*  --  12.2*  HCT 38.8*  --   --  36.8*  --  37.8*  PLT 182  --   --  185  --  196  HEPARINUNFRC  --    < > 0.39  --  0.53 0.54  CREATININE 3.05*  --   --  3.03*  --   --    < > = values in this interval not displayed.    Estimated Creatinine Clearance: 50.4 mL/min (A) (by C-G formula based on SCr of 3.03 mg/dL (H)).   Medical History: Past Medical History:  Diagnosis Date  . Allergy   . Anxiety   . Arrhythmia   . Arthritis   . Atrial fibrillation ([place])   . CHF (congestive heart failure) ([place])   . CKD (chronic kidney disease)   . Diabetes mellitus without complication ([place])   . Diabetes mellitus, type II ([place])   . Heart failure ([place])   . Hyperlipidemia   . Hypertension   . Pancreatitis   . Sleep apnea     Medications:  Scheduled:  . chlorhexidine gluconate (MEDLINE KIT)  15 mL Mouth Rinse BID  . famotidine  20 mg Per Tube Daily  . feeding supplement (PRO-STAT SUGAR FREE 64)  60 mL Per Tube TID  . free water  200 mL Per Tube Q8H  . insulin aspart  0-20 Units Subcutaneous Q4H  . insulin aspart  5 Units Subcutaneous Q4H  . insulin glargine  85 Units Subcutaneous BID  . mouth rinse  15 mL Mouth Rinse 10 times per day  . metoprolol tartrate  50 mg Per Tube BID  . multivitamin  15 mL Per Tube Daily  . sodium chloride flush  10-40 mL Intracatheter Q12H   Infusions:  . diltiazem (CARDIZEM) infusion 7.5 mg/hr (04/23/18 0000)  .  feeding supplement (VITAL HIGH PROTEIN) 50 mL/hr at 04/23/18 0000  . fentaNYL infusion INTRAVENOUS 250 mcg/hr (04/23/18 0000)  . heparin 2,300 Units/hr (04/23/18 0000)  . linezolid (ZYVOX) IV Stopped (04/22/18 2312)    Assessment: Pharmacy consulted for warfarin and heparin drip management for 47 yo male admitted to the ICU s/p cardiac arrest. Patient has history significant for aortic valve replacement, atrial fibrillation and CHF. Head CT is negative for infarction or hemorrhage.   Patient takes warfarin 22m daily as an outpatient for goal INR 2.5-3.5.    Goal of Therapy:  INR 2-3 Heparin level 0.3-0.7 units/ml Monitor platelets by anticoagulation protocol: Yes   Plan:  Continue infusion at current rate and f/u AM labs.  6/16 AM heparin level  0.54. Continue current regimen. Recheck heparin level and CBC with tomorrow AM labs.  MEloise Harman PharmD Clinical Pharmacist 04/23/2018 5:45 AM

## 2018-04-23 NOTE — Progress Notes (Signed)
RR increased to 30 after ABG

## 2018-04-23 NOTE — Progress Notes (Signed)
During WUA pt became agitated, hypertensive and had an 11-beat run of V-tach. Fentanyl increased back to 200 mcg and bolus given. Will continue to monitor.

## 2018-04-23 NOTE — Progress Notes (Signed)
PULMONARY / CRITICAL CARE MEDICINE   Name: Alexander RiffleOtis Leon Alexander MRN: [id number] DOB: 09/12/1971    ADMISSION DATE:  04/07/2018  PT PROFILE:   33M with PMH of atrial fibrillation, status post ablation and aortic valve replacement on long term warfarin, CHF, CKD, diabetes, obstructive sleep apnea and hypertension, suffered a pulseless VT cardiac arrest in MRI when he was being evaluated for back pain and sciatica, subsequently intubated in the intensive care unit. Difficult airway noted  EVENTS/RESULTS: 06/11 CT chest: Dense RLL consolidation.  Segmental RML and LLL atelectasis.  Patchy GGO in upper lobes bilaterally 06/12 rough night with desaturation.  Transient bradycardia.  06/13 More responsive.  Gas exchange improving.  No further bradycardia.  Amiodarone has been stopped.  Now with AFRVR (up to 150/min).  Follows commands.  Remains on fentanyl infusion.  Propofol has been discontinued.   INDWELLING DEVICES:: R IJ CVL 05/31 >> 06/10 ETT 05/31 >>  RUE PICC 06/10 >>    MICRO DATA: MRSA PCR 05/31 >> NEG Urine 06/01 >> NEG Resp 06/01 >> MRSA Blood 06/01 >> NEG C diff 06/04 >> NEG  Urine 06/05 >> NEG Resp 06/05 >> MRSA Blood 06/05 >> NEG Resp 06/11 >> moderate S aureus (sens pending) >>   ANTIMICROBIALS:  Pip-tazo 05/31 >> 06/03 Vanc 05/31 >> 6/12 Cefepime 06/11 >>  Linezolid 06/12 >>   SUBJECTIVE:  No further bradycardia.  Amiodarone has been stopped.  Now with AFRVR (up to 150/min).  Follows commands.  Remains on fentanyl infusion.    VITAL SIGNS: BP 116/86   Pulse 93   Temp 99 F (37.2 C) (Oral)   Resp (!) 24   Ht 5\' 10"  (1.778 m)   Wt (!) 417 lb (189.1 kg)   SpO2 (!) 89%   BMI 59.83 kg/m   HEMODYNAMICS:    VENTILATOR SETTINGS: Vent Mode: PCV FiO2 (%):  [55 %] 55 % Set Rate:  [16 bmp-30 bmp] 30 bmp PEEP:  [10 cmH20] 10 cmH20  INTAKE / OUTPUT: I/O last 3 completed shifts: In: 4283.3 [I.V.:1885.8; NG/GT:1797.5; IV Piggyback:600] Out: 3425 [Urine:2850;  Emesis/NG output:475; Stool:100]  PHYSICAL EXAMINATION: General: NAD, on mechanical ventilation Neuro: Moves all extremities, cranial nerves intact HEENT: WNL Cardiovascular: IRIR, tachy no M Lungs: No wheezes or other adventitious sounds anteriorly Abdomen: NT, nondistended, soft, diminished BS Ext: Mild symmetric LE edema Skin: No lesions noted  LABS:  BMET Recent Labs  Lab 04/19/18 0433 04/21/18 0440 04/22/18 0527  NA 143 143 144  K 4.3 4.7 4.4  CL 104 105 108  CO2 27 30 28   BUN 89* 96* 98*  CREATININE 3.29* 3.05* 3.03*  GLUCOSE 268* 264* 178*    Electrolytes Recent Labs  Lab 04/17/18 0404  04/18/18 1747 04/19/18 0433 04/21/18 0440 04/22/18 0527  CALCIUM 8.6*   < > 7.9* 8.0* 9.0 8.9  MG 2.5*  --  2.3  --   --  2.9*  PHOS 3.4  --  5.3*  --   --  3.9   < > = values in this interval not displayed.    CBC Recent Labs  Lab 04/21/18 0440 04/22/18 0527 04/23/18 0429  WBC 17.1* 15.9* 14.7*  HGB 12.5* 11.8* 12.2*  HCT 38.8* 36.8* 37.8*  PLT 182 185 196    Coag's Recent Labs  Lab 04/18/18 1552  APTT 83*  INR 1.37    Sepsis Markers Recent Labs  Lab 04/20/18 0306 04/21/18 0440 04/22/18 0527  PROCALCITON 41.34 29.96 21.80    ABG Recent  Labs  Lab 04/18/18 1952 04/22/18 1115 04/23/18 0429  PHART 7.32* 7.25* 7.22*  PCO2ART 63* 77* 84*  PO2ART 72* 69* 58*    Liver Enzymes Recent Labs  Lab 04/18/18 0429 04/21/18 0440  AST 25 135*  ALT 21 166*  ALKPHOS 68 69  BILITOT 1.4* 1.7*  ALBUMIN 2.6* 2.5*    Cardiac Enzymes Recent Labs  Lab 04/18/18 1931  TROPONINI 0.11*    Glucose Recent Labs  Lab 04/22/18 1555 04/22/18 1746 04/22/18 1919 04/22/18 2318 04/23/18 0417 04/23/18 0732  GLUCAP 217* 194* 191* 232* 204* 175*    CXR: No new film   ASSESSMENT / PLAN:  PULMONARY A: Respiratory arrest 05/31, Prolonged ventilator dependence Persistently high FiO2/PEEP requirements, RLL consolidation on linezolid, pending tracheostomy  on Tuesday. Discuss with family about performing PEG tube in addition. Will consult GI. PCO2 is 84 and pH 7.22, will increase minute ventilation as tolerated  CARDIOVASCULAR A:  VT cardiac arrest 5/31 (felt secondary to respiratory rest) Chronic atrial fibrillation with RVR Transient bradycardia 6/12, presently on Cardizem infusion with controlled ventricular response along with anticoagulation  RENAL A:   CKD, BUN/creatinine have appeared to of plateaued 98/3.03, continue to support hemodynamics will hold on further diuresis at this point, no indications for acute hemodialysis  ENDOCRINE A:   DM 2 with severe insulin resistance, Sliding scale being adjusted, Has been on and off of insulin infusion   FAMILY  Wife updated at bedside   CCM time: 40 mins The above time includes time spent in consultation with patient and/or family members and reviewing care plan on multidisciplinary rounds   Tora Kindred, DO   Patient ID: Alexander Alexander, male   DOB: 1971-01-17, 47 y.o.   MRN: [id number] Patient ID: Alexander Alexander, male   DOB: February 20, 1971, 47 y.o.   MRN: [id number] Patient ID: Alexander Alexander, male   DOB: [birth year], 47 y.o.   MRN: [id number]

## 2018-04-24 ENCOUNTER — Inpatient Hospital Stay: Payer: Medicare HMO

## 2018-04-24 ENCOUNTER — Encounter: Payer: Self-pay | Admitting: Anesthesiology

## 2018-04-24 DIAGNOSIS — Z431 Encounter for attention to gastrostomy: Secondary | ICD-10-CM

## 2018-04-24 LAB — COMPREHENSIVE METABOLIC PANEL
ALBUMIN: 2.6 g/dL — AB (ref 3.5–5.0)
ALT: 84 U/L — ABNORMAL HIGH (ref 17–63)
ANION GAP: 9 (ref 5–15)
AST: 34 U/L (ref 15–41)
Alkaline Phosphatase: 64 U/L (ref 38–126)
BUN: 96 mg/dL — AB (ref 6–20)
CO2: 30 mmol/L (ref 22–32)
Calcium: 9.2 mg/dL (ref 8.9–10.3)
Chloride: 108 mmol/L (ref 101–111)
Creatinine, Ser: 2.92 mg/dL — ABNORMAL HIGH (ref 0.61–1.24)
GFR calc Af Amer: 28 mL/min — ABNORMAL LOW (ref >60)
GFR calc non Af Amer: 24 mL/min — ABNORMAL LOW (ref >60)
Glucose, Bld: 170 mg/dL — ABNORMAL HIGH (ref 65–99)
POTASSIUM: 4.6 mmol/L (ref 3.5–5.1)
SODIUM: 147 mmol/L — AB (ref 135–145)
Total Bilirubin: 1 mg/dL (ref 0.3–1.2)
Total Protein: 8.1 g/dL (ref 6.5–8.1)

## 2018-04-24 LAB — BLOOD GAS, ARTERIAL
Acid-Base Excess: 6 mmol/L — ABNORMAL HIGH (ref 0.0–2.0)
BICARBONATE: 35.4 mmol/L — AB (ref 20.0–28.0)
FIO2: 60
MECHANICAL RATE: 30
O2 SAT: 88.8 %
PATIENT TEMPERATURE: 37
PEEP/CPAP: 10 cmH2O
PO2 ART: 64 mmHg — AB (ref 83.0–108.0)
pCO2 arterial: 77 mmHg (ref 32.0–48.0)
pH, Arterial: 7.27 — ABNORMAL LOW (ref 7.350–7.450)

## 2018-04-24 LAB — GLUCOSE, CAPILLARY
GLUCOSE-CAPILLARY: 133 mg/dL — AB (ref 65–99)
GLUCOSE-CAPILLARY: 142 mg/dL — AB (ref 65–99)
Glucose-Capillary: 162 mg/dL — ABNORMAL HIGH (ref 65–99)
Glucose-Capillary: 141 mg/dL — ABNORMAL HIGH (ref 65–99)
Glucose-Capillary: 211 mg/dL — ABNORMAL HIGH (ref 65–99)
Glucose-Capillary: 202 mg/dL — ABNORMAL HIGH (ref 65–99)

## 2018-04-24 LAB — CBC
HCT: 37.4 % — ABNORMAL LOW (ref 40.0–52.0)
HEMOGLOBIN: 12.1 g/dL — AB (ref 13.0–18.0)
MCH: 28 pg (ref 26.0–34.0)
MCHC: 32.5 g/dL (ref 32.0–36.0)
MCV: 86.3 fL (ref 80.0–100.0)
Platelets: 207 10*3/uL (ref 150–440)
RBC: 4.33 MIL/uL — ABNORMAL LOW (ref 4.40–5.90)
RDW: 18.1 % — AB (ref 11.5–14.5)
WBC: 14.7 10*3/uL — ABNORMAL HIGH (ref 3.8–10.6)

## 2018-04-24 LAB — C DIFFICILE QUICK SCREEN W PCR REFLEX
C DIFFICILE (CDIFF) INTERP: NOT DETECTED
C Diff antigen: NEGATIVE
C Diff toxin: NEGATIVE

## 2018-04-24 LAB — HEPARIN LEVEL (UNFRACTIONATED): HEPARIN UNFRACTIONATED: 0.62 [IU]/mL (ref 0.30–0.70)

## 2018-04-24 MED ORDER — FENTANYL 75 MCG/HR TD PT72
150.0000 ug | MEDICATED_PATCH | TRANSDERMAL | Status: DC
  Administered 2018-04-24 – 2018-05-15 (×8): 150 ug via TRANSDERMAL
  Filled 2018-04-24 (×8): qty 2

## 2018-04-24 MED ORDER — INSULIN GLARGINE 100 UNIT/ML ~~LOC~~ SOLN
85.0000 [IU] | Freq: Two times a day (BID) | SUBCUTANEOUS | Status: DC
  Administered 2018-04-24 – 2018-05-11 (×32): 85 [IU] via SUBCUTANEOUS
  Filled 2018-04-24 (×39): qty 0.85

## 2018-04-24 MED ORDER — DEXTROSE 5 % IV SOLN
INTRAVENOUS | Status: DC
  Administered 2018-04-24: 50 mL/h via INTRAVENOUS
  Administered 2018-04-25 – 2018-04-26 (×2): via INTRAVENOUS

## 2018-04-24 MED ORDER — DILTIAZEM 12 MG/ML ORAL SUSPENSION
45.0000 mg | Freq: Four times a day (QID) | ORAL | Status: DC
  Administered 2018-04-24 – 2018-04-25 (×5): 45 mg
  Filled 2018-04-24 (×7): qty 6

## 2018-04-24 MED ORDER — INSULIN ASPART 100 UNIT/ML ~~LOC~~ SOLN
0.0000 [IU] | SUBCUTANEOUS | Status: DC
  Administered 2018-04-24: 5 [IU] via SUBCUTANEOUS
  Administered 2018-04-24: 3 [IU] via SUBCUTANEOUS
  Administered 2018-04-24: 2 [IU] via SUBCUTANEOUS
  Administered 2018-04-24: 5 [IU] via SUBCUTANEOUS
  Administered 2018-04-25 (×2): 3 [IU] via SUBCUTANEOUS
  Filled 2018-04-24 (×6): qty 1

## 2018-04-24 NOTE — Plan of Care (Signed)
Patient tolerated ventilator.  Stool specimen sent to lab per NP for ? C diff.  Patient alert and responsive. Agitation noted when providing care.  Wife at bedside.  No acute concerns at this time.  Will continue to monitor.

## 2018-04-24 NOTE — Progress Notes (Signed)
[place] for heparin drip management Indication: atrial fibrillation and aortic valve replacement   Allergies  Allergen Reactions  . Lexapro [Escitalopram Oxalate] Swelling    Patient Measurements: Height: 5' 10"  (177.8 cm) Weight: (!) 417 lb (189.1 kg) IBW/kg (Calculated) : 73 Heparin Dosing Weight: 111kg  Vital Signs: Temp: 98.4 F (36.9 C) (06/17 0000) Temp Source: Oral (06/17 0000) BP: 116/71 (06/17 0400) Pulse Rate: 89 (06/17 0100)  Labs: Recent Labs    04/22/18 0527 04/22/18 1425 04/23/18 0429 04/24/18 0412  HGB 11.8*  --  12.2* 12.1*  HCT 36.8*  --  37.8* 37.4*  PLT 185  --  196 207  HEPARINUNFRC  --  0.53 0.54 0.62  CREATININE 3.03*  --   --  2.92*    Estimated Creatinine Clearance: 52.3 mL/min (A) (by C-G formula based on SCr of 2.92 mg/dL (H)).   Medical History: Past Medical History:  Diagnosis Date  . Allergy   . Anxiety   . Arrhythmia   . Arthritis   . Atrial fibrillation ([place])   . CHF (congestive heart failure) ([place])   . CKD (chronic kidney disease)   . Diabetes mellitus without complication ([place])   . Diabetes mellitus, type II ([place])   . Heart failure ([place])   . Hyperlipidemia   . Hypertension   . Pancreatitis   . Sleep apnea     Medications:  Scheduled:  . chlorhexidine gluconate (MEDLINE KIT)  15 mL Mouth Rinse BID  . famotidine  20 mg Per Tube Daily  . feeding supplement (PRO-STAT SUGAR FREE 64)  60 mL Per Tube TID  . free water  200 mL Per Tube Q8H  . insulin aspart  0-20 Units Subcutaneous Q4H  . insulin aspart  5 Units Subcutaneous Q4H  . insulin glargine  85 Units Subcutaneous BID  . ipratropium-albuterol  3 mL Nebulization Q6H  . ipratropium-albuterol      . mouth rinse  15 mL Mouth Rinse 10 times per day  . metoprolol tartrate  50 mg Per Tube BID  . multivitamin  15 mL Per Tube Daily  . sodium chloride flush  10-40 mL Intracatheter Q12H   Infusions:  . diltiazem (CARDIZEM) infusion  7.5 mg/hr (04/24/18 0200)  . feeding supplement (VITAL HIGH PROTEIN) 50 mL/hr at 04/24/18 0200  . fentaNYL infusion INTRAVENOUS 350 mcg/hr (04/24/18 0200)  . heparin 2,300 Units/hr (04/24/18 0200)  . linezolid (ZYVOX) IV Stopped (04/23/18 2350)    Assessment: Pharmacy consulted for warfarin and heparin drip management for 47 yo male admitted to the ICU s/p cardiac arrest. Patient has history significant for aortic valve replacement, atrial fibrillation and CHF. Head CT is negative for infarction or hemorrhage.   Patient takes warfarin 20m daily as an outpatient for goal INR 2.5-3.5.    Goal of Therapy:  INR 2-3 Heparin level 0.3-0.7 units/ml Monitor platelets by anticoagulation protocol: Yes   Plan:  Continue infusion at current rate and f/u AM labs.  6/16 AM heparin level  0.54. Continue current regimen. Recheck heparin level and CBC with tomorrow AM labs.  6/17 AM heparin level  0.62. Continue current regimen. Recheck heparin level and CBC with tomorrow AM labs.  MEloise Harman PharmD Clinical Pharmacist 04/24/2018 4:57 AM

## 2018-04-24 NOTE — Progress Notes (Signed)
[place] for heparin drip management Indication: atrial fibrillation and aortic valve replacement   Allergies  Allergen Reactions  . Lexapro [Escitalopram Oxalate] Swelling    Patient Measurements: Height: 5' 10"  (177.8 cm) Weight: (!) 417 lb (189.1 kg) IBW/kg (Calculated) : 73 Heparin Dosing Weight: 111kg  Vital Signs: Temp: 99.1 F (37.3 C) (06/17 0400) Temp Source: Axillary (06/17 0400) BP: 116/71 (06/17 0400) Pulse Rate: 89 (06/17 0100)  Labs: Recent Labs    04/22/18 0527 04/22/18 1425 04/23/18 0429 04/24/18 0412  HGB 11.8*  --  12.2* 12.1*  HCT 36.8*  --  37.8* 37.4*  PLT 185  --  196 207  HEPARINUNFRC  --  0.53 0.54 0.62  CREATININE 3.03*  --   --  2.92*    Estimated Creatinine Clearance: 52.3 mL/min (A) (by C-G formula based on SCr of 2.92 mg/dL (H)).   Medical History: Past Medical History:  Diagnosis Date  . Allergy   . Anxiety   . Arrhythmia   . Arthritis   . Atrial fibrillation ([place])   . CHF (congestive heart failure) ([place])   . CKD (chronic kidney disease)   . Diabetes mellitus without complication ([place])   . Diabetes mellitus, type II ([place])   . Heart failure ([place])   . Hyperlipidemia   . Hypertension   . Pancreatitis   . Sleep apnea     Medications:  Scheduled:  . chlorhexidine gluconate (MEDLINE KIT)  15 mL Mouth Rinse BID  . famotidine  20 mg Per Tube Daily  . feeding supplement (PRO-STAT SUGAR FREE 64)  60 mL Per Tube TID  . fentaNYL  150 mcg Transdermal Q72H  . free water  200 mL Per Tube Q8H  . insulin aspart  0-15 Units Subcutaneous Q4H  . insulin glargine  85 Units Subcutaneous BID  . ipratropium-albuterol  3 mL Nebulization Q6H  . mouth rinse  15 mL Mouth Rinse 10 times per day  . metoprolol tartrate  50 mg Per Tube BID  . multivitamin  15 mL Per Tube Daily  . sodium chloride flush  10-40 mL Intracatheter Q12H   Infusions:  . dextrose    . diltiazem (CARDIZEM) infusion 7.5 mg/hr  (04/24/18 0500)  . feeding supplement (VITAL HIGH PROTEIN) 50 mL/hr at 04/24/18 0500  . fentaNYL infusion INTRAVENOUS 350 mcg/hr (04/24/18 0635)  . heparin 2,300 Units/hr (04/24/18 0713)  . linezolid (ZYVOX) IV Stopped (04/23/18 2350)    Assessment: Pharmacy consulted for warfarin and heparin drip management for 47 yo male admitted to the ICU s/p cardiac arrest. Patient has history significant for aortic valve replacement, atrial fibrillation and CHF. Head CT is negative for infarction or hemorrhage.   Patient takes warfarin 30m daily as an outpatient for goal INR 2.5-3.5.    Goal of Therapy:  INR 2-3 Heparin level 0.3-0.7 units/ml Monitor platelets by anticoagulation protocol: Yes   Plan:   Pt heparin level today is 0.62. Pt planned to have trach tube placed on 6/18. Will continue current regimen but will hold heparin drip at 1900 on 6/17 for trach placement. When heparin is resumed, will resume with no bolus.      CTyson Babinski6/17/2019 11:06 AM

## 2018-04-24 NOTE — Anesthesia Preprocedure Evaluation (Signed)
Anesthesia Evaluation  Patient identified by MRN, date of birth, ID band Patient awake    Reviewed: Allergy & Precautions, H&P , NPO status , Patient's Chart, lab work & pertinent test results  Airway Mallampati: Intubated  TM Distance: >3 FB Neck ROM: full    Dental  (+) Chipped, Poor Dentition   Pulmonary sleep apnea , former smoker,           Cardiovascular Exercise Tolerance: Poor hypertension, +CHF  + dysrhythmias Atrial Fibrillation      Neuro/Psych PSYCHIATRIC DISORDERS Anxiety negative neurological ROS  negative psych ROS   GI/Hepatic negative GI ROS, Neg liver ROS,   Endo/Other  diabetes, Type 2  Renal/GU Renal disease     Musculoskeletal  (+) Arthritis ,   Abdominal   Peds  Hematology negative hematology ROS (+)   Anesthesia Other Findings Past Medical History: No date: Allergy No date: Anxiety No date: Arrhythmia No date: Arthritis No date: Atrial fibrillation (HCC) No date: CHF (congestive heart failure) (HCC) No date: CKD (chronic kidney disease) No date: Diabetes mellitus without complication (HCC) No date: Diabetes mellitus, type II (HCC) No date: Heart failure (HCC) No date: Hyperlipidemia No date: Hypertension No date: Pancreatitis No date: Sleep apnea  Past Surgical History: 08/01/2017: ABLATION No date: MECHANICAL AORTIC VALVE REPLACEMENT  BMI    Body Mass Index:  59.83 kg/m      Reproductive/Obstetrics negative OB ROS                             Anesthesia Physical Anesthesia Plan  ASA: IV  Anesthesia Plan: General ETT   Post-op Pain Management:    Induction: Intravenous  PONV Risk Score and Plan:   Airway Management Planned: Oral ETT  Additional Equipment:   Intra-op Plan:   Post-operative Plan: Post-operative intubation/ventilation  Informed Consent: I have reviewed the patients History and Physical, chart, labs and discussed the  procedure including the risks, benefits and alternatives for the proposed anesthesia with the patient or authorized representative who has indicated his/her understanding and acceptance.   Dental Advisory Given  Plan Discussed with: Anesthesiologist, CRNA and Surgeon  Anesthesia Plan Comments: (Wife consented for risks of anesthesia including but not limited to:  - adverse reactions to medications - damage to teeth, lips or other oral mucosa - sore throat or hoarseness - Damage to heart, brain, lungs or loss of life  Wife voiced understanding.)        Anesthesia Quick Evaluation

## 2018-04-24 NOTE — Progress Notes (Signed)
PULMONARY / CRITICAL CARE MEDICINE   Name: Alexander Alexander MRN: [id number] DOB: 1971-03-24    ADMISSION DATE:  04/07/2018  PT PROFILE:   52M with PMH of atrial fibrillation, status post ablation and aortic valve replacement on long term warfarin, CHF, CKD, diabetes, obstructive sleep apnea and hypertension, suffered a pulseless VT cardiac arrest in MRI when he was being evaluated for back pain and sciatica, subsequently intubated in the intensive care unit. Difficult airway noted  EVENTS/RESULTS: 06/11 CT chest: Dense RLL consolidation.  Segmental RML and LLL atelectasis.  Patchy GGO in upper lobes bilaterally 06/12 rough night with desaturation.  Transient bradycardia.  06/13 More responsive.  Gas exchange improving.  No further bradycardia.  Amiodarone has been stopped. AFRVR (up to 150/min).  Follows commands.  Remains on fentanyl infusion.  Propofol discontinued. 06/17 Gas exchange improving. Rate adequately controlled. Cognition unchanged. Remains on fentanyl infusion. Gastroenterology consultation requested for G tube placement   INDWELLING DEVICES:: R IJ CVL 05/31 >> 06/10 ETT 05/31 >>  RUE PICC 06/10 >>    MICRO DATA: MRSA PCR 05/31 >> NEG Urine 06/01 >> NEG Resp 06/01 >> MRSA Blood 06/01 >> NEG C diff 06/04 >> NEG  Urine 06/05 >> NEG Resp 06/05 >> MRSA Blood 06/05 >> NEG Resp 06/11 >> MRSA C diff 6/17 >> NEG  ANTIMICROBIALS:  Pip-tazo 05/31 >> 06/03 Vanc 05/31 >> 6/12 Cefepime 06/11 >> 06/15 Linezolid 06/12 >>   SUBJECTIVE:  Mains on full vent support.  Gas exchange has improved slightly.  Tachycardia improved.  Remains in atrial fibrillation.  Opens eyes to voice.  Follows commands intermittently.  VITAL SIGNS: BP 116/71 (BP Location: Right Leg)   Pulse 89   Temp 99.1 F (37.3 C) (Axillary)   Resp (!) 30   Ht 5\' 10"  (1.778 m)   Wt (!) 417 lb (189.1 kg)   SpO2 95%   BMI 59.83 kg/m   HEMODYNAMICS:    VENTILATOR SETTINGS: Vent Mode: PCV FiO2 (%):   [55 %-60 %] 55 % Set Rate:  [16 bmp-30 bmp] 16 bmp PEEP:  [10 cmH20] 10 cmH20 Plateau Pressure:  [17 cmH20] 17 cmH20  INTAKE / OUTPUT: I/O last 3 completed shifts: In: 5072.3 [I.V.:1952.3; NG/GT:1750; IV Piggyback:1370] Out: 5425 [Urine:5125; Emesis/NG output:100; Stool:200]  PHYSICAL EXAMINATION: General: Intubated, sedated Neuro: No focal deficits HEENT: NCAT, sclerae white Cardiovascular: IRIR, tachy, no M Lungs: Clear anteriorly Abdomen: NT, ND, NABS, soft Ext: Mild symmetric LE edema  LABS:  BMET Recent Labs  Lab 04/21/18 0440 04/22/18 0527 04/24/18 0412  NA 143 144 147*  K 4.7 4.4 4.6  CL 105 108 108  CO2 30 28 30   BUN 96* 98* 96*  CREATININE 3.05* 3.03* 2.92*  GLUCOSE 264* 178* 170*    Electrolytes Recent Labs  Lab 04/18/18 1747  04/21/18 0440 04/22/18 0527 04/24/18 0412  CALCIUM 7.9*   < > 9.0 8.9 9.2  MG 2.3  --   --  2.9*  --   PHOS 5.3*  --   --  3.9  --    < > = values in this interval not displayed.    CBC Recent Labs  Lab 04/22/18 0527 04/23/18 0429 04/24/18 0412  WBC 15.9* 14.7* 14.7*  HGB 11.8* 12.2* 12.1*  HCT 36.8* 37.8* 37.4*  PLT 185 196 207    Coag's Recent Labs  Lab 04/18/18 1552  APTT 83*  INR 1.37    Sepsis Markers Recent Labs  Lab 04/20/18 0306 04/21/18 0440 04/22/18 0527  PROCALCITON 41.34 29.96 21.80    ABG Recent Labs  Lab 04/22/18 1115 04/23/18 0429 04/24/18 0412  PHART 7.25* 7.22* 7.27*  PCO2ART 77* 84* 77*  PO2ART 69* 58* 64*    Liver Enzymes Recent Labs  Lab 04/18/18 0429 04/21/18 0440 04/24/18 0412  AST 25 135* 34  ALT 21 166* 84*  ALKPHOS 68 69 64  BILITOT 1.4* 1.7* 1.0  ALBUMIN 2.6* 2.5* 2.6*    Cardiac Enzymes Recent Labs  Lab 04/18/18 1931  TROPONINI 0.11*    Glucose Recent Labs  Lab 04/23/18 1130 04/23/18 1555 04/23/18 1956 04/23/18 2357 04/24/18 0421 04/24/18 0713  GLUCAP 219* 151* 173* 157* 162* 133*    CXR: Bilateral pulmonary infiltrates,  NSC   ASSESSMENT / PLAN:  PULMONARY A: Respiratory arrest 05/31 Prolonged ventilator dependence Persistently high FiO2/PEEP requirements RLL consolidation/PNA P:   Cont vent support - settings reviewed and/or adjusted             Continue PCV mode Cont vent bundle Daily SBT if/when meets criteria Tracheostomy tube placement scheduled for 06/18  CARDIOVASCULAR A:  VT cardiac arrest 5/31 (felt secondary to respiratory rest) Chronic atrial fibrillation with RVR Transient bradycardia 6/12 P:  MAP goal > 65 mmHg Continue heparin infusion Continue enteral metoprolol initiated 6/11 Change diltiazem to enteral 6/17  RENAL A:   CKD, baseline creatinine 2.0-2.5 Nonoliguric AKI Mild hypervolemia P:   Monitor BMET intermittently Monitor I/Os Correct electrolytes as indicated   GASTROINTESTINAL A:   Abdominal distention, resolved P:   SUP: Enteral famotidine Continue TF protocol  HEMATOLOGIC A:   Very mild ICU acquired anemia P:  DVT px: heparin infusion (AF) - will hold this evening for trach tube Monitor CBC intermittently Transfuse per usual guidelines   INFECTIOUS A:   MRSA pneumonia Resolving severe sepsis P:   Monitor temp, WBC count Micro and abx as above   ENDOCRINE A:   DM 2 with severe insulin resistance P:   Continue Lantus -we will hold in AM. in anticipation of tracheostomy tube Continue SSI -changed to moderate scale 6/17  NEUROLOGIC A:   Acute encephalopathy ICU/vent associated discomfort P:   RASS goal: 0, -1 Continue PAD protocol After tracheostomy tube placement, will work to transition off of continuous infusions   FAMILY  Wife updated at bedside   CCM time:  35 mins The above time includes time spent in consultation with patient and/or family members and reviewing care plan on multidisciplinary rounds  Billy Fischer, MD PCCM service Mobile [phone] Pager [phone] 04/24/2018 11:42 AM

## 2018-04-24 NOTE — Consult Note (Signed)
Melodie Bouillon, MD [address], [place], [place], [address] [address], [address], [place], [place], [address] Phone: [phone]  Fax: [phone]  Consultation  Referring Provider:     Dr. Katheren Shams Primary Care Physician:  Erasmo Downer, MD Reason for Consultation:     PEG tube placement  Date of Admission:  04/07/2018 Date of Consultation:  04/24/2018         HPI:   Alexander Alexander is a 47 y.o. male admitted on May 31 status post cardiac arrest during an outpatient MRI, with GI consulted for PEG tube placement due to inability to wean off vent.  Patient with history of A. fib status post ablation, aortic valve replacement on warfarin at home, currently on heparin drip as an inpatient.  Patient unable to provide any history as he is intubated and sedated, history obtained from chart. 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  No family at bedside today to discuss PEG tube placement and consent.  As per documentation, tracheostomy planned tomorrow.  Past Medical History:  Diagnosis Date  . Allergy   . Anxiety   . Arrhythmia   . Arthritis   . Atrial fibrillation (HCC)   . CHF (congestive heart failure) (HCC)   . CKD (chronic kidney disease)   . Diabetes mellitus without complication (HCC)   . Diabetes mellitus, type II (HCC)   . Heart failure (HCC)   . Hyperlipidemia   . Hypertension   . Pancreatitis   . Sleep apnea     Past Surgical History:  Procedure Laterality Date  . ABLATION  08/01/2017  . MECHANICAL AORTIC VALVE REPLACEMENT      Prior to Admission medications   Medication Sig Start Date End Date Taking? Authorizing Provider  allopurinol (ZYLOPRIM) 100 MG tablet TAKE 0.5 TABLETS (50 MG TOTAL) BY MOUTH DAILY. 03/10/18  Yes Bacigalupo, Marzella Schlein, MD  ALPRAZolam Prudy Feeler) 0.5 MG tablet Take 1 tablet (0.5 mg total) by mouth 2 (two) times daily. 01/16/18  Yes Bacigalupo, Marzella Schlein, MD  colchicine 0.6 MG tablet TAKE 1 TABLET BY MOUTH  EVERY DAY 04/04/18  Yes Bacigalupo, Marzella Schlein, MD  fluvoxaMINE (LUVOX) 25 MG tablet Take 1 tablet (25 mg total) by mouth at bedtime. 04/05/18  Yes Jomarie Longs, MD  gabapentin (NEURONTIN) 600 MG tablet Take 1 tablet (600 mg total) by mouth 2 (two) times daily. 01/19/18  Yes Bacigalupo, Marzella Schlein, MD  glimepiride (AMARYL) 2 MG tablet Take 1 tablet (2 mg total) by mouth daily with breakfast. 12/09/17  Yes Bacigalupo, Marzella Schlein, MD  insulin detemir (LEVEMIR) 100 UNIT/ML injection Inject 0.65 mLs (65 Units total) into the skin 2 (two) times daily. 01/16/18  Yes Bacigalupo, Marzella Schlein, MD  losartan (COZAAR) 100 MG tablet Take 1 tablet (100 mg total) by mouth daily. 01/16/18  Yes Bacigalupo, Marzella Schlein, MD  metolazone (ZAROXOLYN) 2.5 MG tablet Take 1 tablet (2.5 mg total) by mouth 2 (two) times daily. 03/29/18  Yes Bacigalupo, Marzella Schlein, MD  metoprolol (TOPROL-XL) 200 MG 24 hr tablet Take 200 mg by mouth daily.  12/22/17  Yes [provider]  NOVOLOG 100 UNIT/ML injection Inject 35 Units into the skin 3 (three) times daily with meals. Patient taking differently: Inject 40 Units into the skin 3 (three) times daily with meals.  12/26/17  Yes Bacigalupo, Marzella Schlein, MD  torsemide (DEMADEX) 20 MG tablet Take 2 tablets (40 mg total) by mouth 2 (two) times daily. Patient taking differently:  Take 20 mg by mouth 2 (two) times daily.  11/25/17 04/07/18 Yes Clarisa KindredHackney, Tina A, FNP  warfarin (COUMADIN) 5 MG tablet Take 5 mg by mouth daily at 6 PM.  04/05/18  Yes [provider]  acetaminophen (TYLENOL) 325 MG tablet Take 650 mg by mouth every 6 (six) hours as needed.    [provider]    Family History  Problem Relation Age of Onset  . CAD Mother   . Hypertension Mother   . Hypertension Father   . Asthma Father   . Dementia Father   . Diabetes Brother   . Depression Brother   . Hypertension Brother   . Hypertension Brother   . Depression Brother   . Pancreatic cancer Paternal Aunt        several  aunts and uncles with pancreatic cancer on paternal side  . Breast cancer Maternal Aunt   . Colon cancer Neg Hx   . Prostate cancer Neg Hx      Social History   Tobacco Use  . Smoking status: Former Smoker    Packs/day: 0.50    Years: 20.00    Pack years: 10.00    Types: Cigarettes    Last attempt to quit: 04/25/2014    Years since quitting: 4.0  . Smokeless tobacco: Never Used  . Tobacco comment: quit around june 2015  Substance Use Topics  . Alcohol use: No    Frequency: Never  . Drug use: Not Currently    Types: Cocaine    Comment: January 28, 2017 quit Cocaine    Allergies as of 04/07/2018 - Review Complete 04/07/2018  Allergen Reaction Noted  . Lexapro [escitalopram oxalate] Swelling 04/05/2018    Review of Systems:    All systems reviewed and negative except where noted in HPI.   Physical Exam:  Vital signs in last 24 hours: Vitals:   04/24/18 0300 04/24/18 0400 04/24/18 0406 04/24/18 0719  BP: 117/74 116/71    Pulse:      Resp: (!) 30 (!) 30    Temp:  99.1 F (37.3 C)    TempSrc:  Axillary    SpO2:   95% 95%  Weight:      Height:       Last BM Date: 04/23/18 General:   Intubated, sedated Head:  Normocephalic and atraumatic. Eyes:   No icterus.   Conjunctiva pink. PERRLA. Neck:  Supple; no masses or thyroidomegaly Lungs: Respirations even and unlabored. Lungs clear to auscultation bilaterally.   No wheezes, crackles, or rhonchi.  Abdomen:  Soft, nondistended, nontender. Normal bowel sounds. No appreciable masses or hepatomegaly.  No rebound or guarding.  Neurologic:  grossly normal neurologically. Skin:  Intact without significant lesions or rashes. Cervical Nodes:  No significant cervical adenopathy. Psych:  Alert and cooperative. Normal affect.  LAB RESULTS: Recent Labs    04/22/18 0527 04/23/18 0429 04/24/18 0412  WBC 15.9* 14.7* 14.7*  HGB 11.8* 12.2* 12.1*  HCT 36.8* 37.8* 37.4*  PLT 185 196 207   BMET Recent Labs    04/22/18 0527  04/24/18 0412  NA 144 147*  K 4.4 4.6  CL 108 108  CO2 28 30  GLUCOSE 178* 170*  BUN 98* 96*  CREATININE 3.03* 2.92*  CALCIUM 8.9 9.2   LFT Recent Labs    04/24/18 0412  PROT 8.1  ALBUMIN 2.6*  AST 34  ALT 84*  ALKPHOS 64  BILITOT 1.0   PT/INR No results for input(s): LABPROT, INR in the last  72 hours.  STUDIES: Dg Chest Port 1 View  Result Date: 04/24/2018 CLINICAL DATA:  Acute respiratory failure EXAM: PORTABLE CHEST 1 VIEW COMPARISON:  Yesterday FINDINGS: Stable cardiomegaly.  Status post aortic valve replacement. Endotracheal tube tip is at the clavicular heads. An orogastric tube at least reaches the diaphragm, difficult to visualize due to body habitus. Right upper extremity PICC with tip at the upper cavoatrial junction. Low volume chest with hazy and streaky opacities. IMPRESSION: 1. Unchanged positioning of visible hardware. 2. Cardiomegaly, vascular congestion, and low volumes with atelectasis. Electronically Signed   By: Marnee Spring M.D.   On: 04/24/2018 07:18   Dg Chest Port 1 View  Result Date: 04/23/2018 CLINICAL DATA:  Patient status post respiratory arrest 04/07/2018. EXAM: PORTABLE CHEST 1 VIEW COMPARISON:  Single-view of the chest 04/21/2018 and 04/19/2018. FINDINGS: Endotracheal tube remains in place in good position. There is cardiomegaly and pulmonary edema. No pneumothorax. Likely small to moderate bilateral pleural effusions. IMPRESSION: No change in cardiomegaly and pulmonary edema since the most recent exam. Electronically Signed   By: Drusilla Kanner M.D.   On: 04/23/2018 10:22      Impression / Plan:   Tayon Parekh is a 47 y.o. y/o male with cardiac arrest during outpatient MRI, with history of A. fib as well as ablation, aortic valve replacement on warfarin, currently on heparin drip, with inability to wean off vent post cardiac arrest, GI consulted for PEG tube placement  Tracheostomy planned tomorrow will continue to evaluate patient,  and observe clinical status/mental status after trach placement Discuss risks and benefits of PEG tube with family, and PEG tube can be scheduled for later this week depending on clinical status Heparin drip will need to be held for 6 hours prior to the PEG tube placement Continue tube feeds until then  Thank you for involving me in the care of this patient.      LOS: 17 days   Pasty Spillers, MD  04/24/2018, 12:29 PM

## 2018-04-24 NOTE — Progress Notes (Signed)
Abg results notified  To m.tukov , no new orders

## 2018-04-24 NOTE — Progress Notes (Signed)
[facility] [place] [facility]   PATIENT NAME: Alexander Alexander    MR#:  [id number]  DATE OF BIRTH:  Feb 06, 1971  SUBJECTIVE:  CHIEF COMPLAINT:   Chief Complaint  Patient presents with  . Back Pain  for trach tomorrow, remains difficult vent wean  REVIEW OF SYSTEMS:  CONSTITUTIONAL: No fever, fatigue or weakness.  EYES: No blurred or double vision.  EARS, NOSE, AND THROAT: No tinnitus or ear pain.  RESPIRATORY: No cough, shortness of breath, wheezing or hemoptysis.  CARDIOVASCULAR: No chest pain, orthopnea, edema.  GASTROINTESTINAL: No nausea, vomiting, diarrhea or abdominal pain.  GENITOURINARY: No dysuria, hematuria.  ENDOCRINE: No polyuria, nocturia,  HEMATOLOGY: No anemia, easy bruising or bleeding SKIN: No rash or lesion. MUSCULOSKELETAL: No joint pain or arthritis.   NEUROLOGIC: No tingling, numbness, weakness.  PSYCHIATRY: No anxiety or depression.   ROS  DRUG ALLERGIES:   Allergies  Allergen Reactions  . Lexapro [Escitalopram Oxalate] Swelling    VITALS:  Blood pressure 116/71, pulse 89, temperature 99.1 F (37.3 C), temperature source Axillary, resp. rate (!) 30, height 5\' 10"  (1.778 m), weight (!) 189.1 kg (417 lb), SpO2 95 %.  PHYSICAL EXAMINATION:  GENERAL:  47 y.o.-year-old patient lying in the bed with no acute distress.  EYES: Pupils equal, round, reactive to light and accommodation. No scleral icterus. Extraocular muscles intact.  HEENT: Head atraumatic, normocephalic. Oropharynx and nasopharynx clear.  NECK:  Supple, no jugular venous distention. No thyroid enlargement, no tenderness.  LUNGS: Normal breath sounds bilaterally, no wheezing, rales,rhonchi or crepitation. No use of accessory muscles of respiration.  CARDIOVASCULAR: S1, S2 normal. No murmurs, rubs, or gallops.  ABDOMEN: Soft, nontender, nondistended. Bowel sounds present. No organomegaly or mass.  EXTREMITIES: No pedal edema, cyanosis, or clubbing.  NEUROLOGIC: Cranial  nerves II through XII are intact. Muscle strength 5/5 in all extremities. Sensation intact. Gait not checked.  PSYCHIATRIC: The patient is alert and oriented x 3.  SKIN: No obvious rash, lesion, or ulcer.   Physical Exam LABORATORY PANEL:   CBC Recent Labs  Lab 04/24/18 0412  WBC 14.7*  HGB 12.1*  HCT 37.4*  PLT 207   ------------------------------------------------------------------------------------------------------------------  Chemistries  Recent Labs  Lab 04/22/18 0527 04/24/18 0412  NA 144 147*  K 4.4 4.6  CL 108 108  CO2 28 30  GLUCOSE 178* 170*  BUN 98* 96*  CREATININE 3.03* 2.92*  CALCIUM 8.9 9.2  MG 2.9*  --   AST  --  34  ALT  --  84*  ALKPHOS  --  64  BILITOT  --  1.0   ------------------------------------------------------------------------------------------------------------------  Cardiac Enzymes Recent Labs  Lab 04/18/18 1931  TROPONINI 0.11*   ------------------------------------------------------------------------------------------------------------------  RADIOLOGY:  Dg Chest Port 1 View  Result Date: 04/24/2018 CLINICAL DATA:  Acute respiratory failure EXAM: PORTABLE CHEST 1 VIEW COMPARISON:  Yesterday FINDINGS: Stable cardiomegaly.  Status post aortic valve replacement. Endotracheal tube tip is at the clavicular heads. An orogastric tube at least reaches the diaphragm, difficult to visualize due to body habitus. Right upper extremity PICC with tip at the upper cavoatrial junction. Low volume chest with hazy and streaky opacities. IMPRESSION: 1. Unchanged positioning of visible hardware. 2. Cardiomegaly, vascular congestion, and low volumes with atelectasis. Electronically Signed   By: Marnee Spring M.D.   On: 04/24/2018 07:18   Dg Chest Port 1 View  Result Date: 04/23/2018 CLINICAL DATA:  Patient status post respiratory arrest 04/07/2018. EXAM: PORTABLE CHEST 1 VIEW COMPARISON:  Single-view of  the chest 04/21/2018 and 04/19/2018. FINDINGS:  Endotracheal tube remains in place in good position. There is cardiomegaly and pulmonary edema. No pneumothorax. Likely small to moderate bilateral pleural effusions. IMPRESSION: No change in cardiomegaly and pulmonary edema since the most recent exam. Electronically Signed   By: Drusilla Kannerhomas  Dalessio M.D.   On: 04/23/2018 10:22    ASSESSMENT AND PLAN:  47 year old male with past medical history significant for A. fib status post ablation, aortic valve replacement on warfarin, history of congestive heart failure, CKD, morbid obesity, diabetes and sleep apnea who was getting an outpatient MRI of his lower back had a cardiac arrest.  *Acute cardiopulmonary arrest From respiratory causes Difficult weaning from vent,in d/w intensivist-plans for tracheostomy  on tomorrow, continue tube feeds, adult pain protocol  *AcuteMRSA/VAP ContinueLinezolid,vent protocol  *Atrial fibrillationwith RVR Stable Continuemetoprolol,amiodarone, heparin drip  *History of mechanical aortic valve replacement Stable Onheparin drip  *Acute toxic metabolic encephalopathy  Neurology input appreciated  Continue Kepprafor possible partial seizures  *Chronic diabetes mellitus type 2 Stable on current regiment  *acute renal failurew/ CKD Secondary toATN  *Acute septic shock Resolved Treated on our sepsis protocol, did receive course of antibiotics   All the records are reviewed and case discussed with Care Management/Social Workerr. Management plans discussed with the patient, family and they are in agreement.  CODE STATUS: full  TOTAL TIME TAKING CARE OF THIS PATIENT: 35 minutes.     POSSIBLE D/C IN 5 DAYS, DEPENDING ON CLINICAL CONDITION.   Evelena AsaMontell D Cochise Dinneen M.D on 04/24/2018   Between 7am to 6pm - Pager - [phone]  After 6pm go to [email or web address] - password [email or web address]  [facility]  Office  [phone]  CC: Primary care physician; Erasmo DownerBacigalupo, Angela M,  MD  Note: This dictation was prepared with Dragon dictation along with smaller phrase technology. Any transcriptional errors that result from this process are unintentional.

## 2018-04-25 ENCOUNTER — Inpatient Hospital Stay: Payer: Medicare HMO | Admitting: Anesthesiology

## 2018-04-25 ENCOUNTER — Inpatient Hospital Stay: Payer: Medicare HMO

## 2018-04-25 ENCOUNTER — Encounter: Payer: Self-pay | Admitting: Certified Registered Nurse Anesthetist

## 2018-04-25 ENCOUNTER — Encounter
Admission: EM | Disposition: A | Discharge status: Home Health | Payer: Self-pay | Source: Home / Self Care | Attending: Family Medicine

## 2018-04-25 DIAGNOSIS — Z978 Presence of other specified devices: Secondary | ICD-10-CM

## 2018-04-25 HISTORY — PX: TRACHEOSTOMY TUBE PLACEMENT: SHX814

## 2018-04-25 LAB — BASIC METABOLIC PANEL
ANION GAP: 6 (ref 5–15)
BUN: 83 mg/dL — ABNORMAL HIGH (ref 6–20)
CO2: 31 mmol/L (ref 22–32)
Calcium: 9 mg/dL (ref 8.9–10.3)
Chloride: 110 mmol/L (ref 101–111)
Creatinine, Ser: 2.43 mg/dL — ABNORMAL HIGH (ref 0.61–1.24)
GFR calc non Af Amer: 30 mL/min — ABNORMAL LOW (ref >60)
GFR, EST AFRICAN AMERICAN: 35 mL/min — AB (ref >60)
GLUCOSE: 202 mg/dL — AB (ref 65–99)
POTASSIUM: 5.1 mmol/L (ref 3.5–5.1)
Sodium: 147 mmol/L — ABNORMAL HIGH (ref 135–145)

## 2018-04-25 LAB — CBC
HEMATOCRIT: 33.5 % — AB (ref 40.0–52.0)
HEMOGLOBIN: 10.3 g/dL — AB (ref 13.0–18.0)
MCH: 27.5 pg (ref 26.0–34.0)
MCHC: 30.8 g/dL — AB (ref 32.0–36.0)
MCV: 89.5 fL (ref 80.0–100.0)
Platelets: 174 10*3/uL (ref 150–440)
RBC: 3.74 MIL/uL — AB (ref 4.40–5.90)
RDW: 19 % — ABNORMAL HIGH (ref 11.5–14.5)
WBC: 10.3 10*3/uL (ref 3.8–10.6)

## 2018-04-25 LAB — GLUCOSE, CAPILLARY
GLUCOSE-CAPILLARY: 157 mg/dL — AB (ref 65–99)
GLUCOSE-CAPILLARY: 195 mg/dL — AB (ref 65–99)
Glucose-Capillary: 197 mg/dL — ABNORMAL HIGH (ref 65–99)
Glucose-Capillary: 164 mg/dL — ABNORMAL HIGH (ref 65–99)
Glucose-Capillary: 148 mg/dL — ABNORMAL HIGH (ref 65–99)
Glucose-Capillary: 150 mg/dL — ABNORMAL HIGH (ref 65–99)

## 2018-04-25 SURGERY — CREATION, TRACHEOSTOMY
Anesthesia: General | Wound class: Clean Contaminated

## 2018-04-25 MED ORDER — FENTANYL CITRATE (PF) 250 MCG/5ML IJ SOLN
INTRAMUSCULAR | Status: AC
  Filled 2018-04-25: qty 5

## 2018-04-25 MED ORDER — ROCURONIUM BROMIDE 50 MG/5ML IV SOLN
INTRAVENOUS | Status: AC
  Filled 2018-04-25: qty 1

## 2018-04-25 MED ORDER — LIDOCAINE HCL (PF) 2 % IJ SOLN
INTRAMUSCULAR | Status: AC
  Filled 2018-04-25: qty 10

## 2018-04-25 MED ORDER — EPHEDRINE SULFATE 50 MG/ML IJ SOLN
INTRAMUSCULAR | Status: AC
  Filled 2018-04-25: qty 1

## 2018-04-25 MED ORDER — ONDANSETRON HCL 4 MG/2ML IJ SOLN
INTRAMUSCULAR | Status: AC
  Filled 2018-04-25: qty 2

## 2018-04-25 MED ORDER — PROPOFOL 10 MG/ML IV BOLUS
INTRAVENOUS | Status: DC | PRN
  Administered 2018-04-25: 50 mg via INTRAVENOUS

## 2018-04-25 MED ORDER — MIDAZOLAM HCL 5 MG/5ML IJ SOLN
INTRAMUSCULAR | Status: AC
  Filled 2018-04-25: qty 5

## 2018-04-25 MED ORDER — HEPARIN (PORCINE) IN NACL 100-0.45 UNIT/ML-% IJ SOLN
2000.0000 [IU]/h | INTRAMUSCULAR | Status: DC
Start: 2018-04-25 — End: 2018-04-28
  Administered 2018-04-25 – 2018-04-28 (×5): 2000 [IU]/h via INTRAVENOUS
  Filled 2018-04-25 (×5): qty 250

## 2018-04-25 MED ORDER — CLONAZEPAM 1 MG PO TABS
1.0000 mg | ORAL_TABLET | Freq: Two times a day (BID) | ORAL | Status: DC
  Administered 2018-04-25 (×2): 1 mg
  Filled 2018-04-25 (×2): qty 1

## 2018-04-25 MED ORDER — INSULIN ASPART 100 UNIT/ML ~~LOC~~ SOLN
0.0000 [IU] | SUBCUTANEOUS | Status: DC
  Administered 2018-04-25 (×2): 3 [IU] via SUBCUTANEOUS
  Administered 2018-04-25: 4 [IU] via SUBCUTANEOUS
  Administered 2018-04-26 – 2018-04-27 (×7): 7 [IU] via SUBCUTANEOUS
  Administered 2018-04-27: 11 [IU] via SUBCUTANEOUS
  Administered 2018-04-27: 7 [IU] via SUBCUTANEOUS
  Administered 2018-04-27: 20 [IU] via SUBCUTANEOUS
  Administered 2018-04-27: 7 [IU] via SUBCUTANEOUS
  Administered 2018-04-28: 4 [IU] via SUBCUTANEOUS
  Administered 2018-04-28: 3 [IU] via SUBCUTANEOUS
  Administered 2018-04-28 (×2): 4 [IU] via SUBCUTANEOUS
  Administered 2018-04-29: 3 [IU] via SUBCUTANEOUS
  Administered 2018-04-30: 4 [IU] via SUBCUTANEOUS
  Administered 2018-04-30 (×2): 3 [IU] via SUBCUTANEOUS
  Administered 2018-04-30: 4 [IU] via SUBCUTANEOUS
  Administered 2018-04-30 (×3): 3 [IU] via SUBCUTANEOUS
  Administered 2018-05-02: 4 [IU] via SUBCUTANEOUS
  Administered 2018-05-02: 3 [IU] via SUBCUTANEOUS
  Administered 2018-05-02 – 2018-05-03 (×6): 4 [IU] via SUBCUTANEOUS
  Administered 2018-05-03 – 2018-05-04 (×3): 3 [IU] via SUBCUTANEOUS
  Administered 2018-05-05: 4 [IU] via SUBCUTANEOUS
  Administered 2018-05-05: 7 [IU] via SUBCUTANEOUS
  Administered 2018-05-05: 3 [IU] via SUBCUTANEOUS
  Administered 2018-05-05: 4 [IU] via SUBCUTANEOUS
  Administered 2018-05-05: 7 [IU] via SUBCUTANEOUS
  Administered 2018-05-05 – 2018-05-06 (×4): 4 [IU] via SUBCUTANEOUS
  Administered 2018-05-06: 3 [IU] via SUBCUTANEOUS
  Administered 2018-05-07: 4 [IU] via SUBCUTANEOUS
  Administered 2018-05-07: 3 [IU] via SUBCUTANEOUS
  Administered 2018-05-07 – 2018-05-08 (×6): 4 [IU] via SUBCUTANEOUS
  Administered 2018-05-09 (×4): 3 [IU] via SUBCUTANEOUS
  Administered 2018-05-10: 4 [IU] via SUBCUTANEOUS
  Administered 2018-05-10: 7 [IU] via SUBCUTANEOUS
  Administered 2018-05-10: 3 [IU] via SUBCUTANEOUS
  Administered 2018-05-10: 11 [IU] via SUBCUTANEOUS
  Administered 2018-05-10 (×2): 4 [IU] via SUBCUTANEOUS
  Administered 2018-05-11 (×2): 11 [IU] via SUBCUTANEOUS
  Administered 2018-05-11: 15 [IU] via SUBCUTANEOUS
  Administered 2018-05-11: 11 [IU] via SUBCUTANEOUS
  Administered 2018-05-11: 7 [IU] via SUBCUTANEOUS
  Administered 2018-05-13 (×2): 4 [IU] via SUBCUTANEOUS
  Administered 2018-05-14: 7 [IU] via SUBCUTANEOUS
  Administered 2018-05-14: 4 [IU] via SUBCUTANEOUS
  Administered 2018-05-14: 11 [IU] via SUBCUTANEOUS
  Administered 2018-05-14: 7 [IU] via SUBCUTANEOUS
  Administered 2018-05-14: 4 [IU] via SUBCUTANEOUS
  Administered 2018-05-15: 3 [IU] via SUBCUTANEOUS
  Administered 2018-05-15 (×2): 7 [IU] via SUBCUTANEOUS
  Administered 2018-05-15: 4 [IU] via SUBCUTANEOUS
  Administered 2018-05-15: 3 [IU] via SUBCUTANEOUS
  Administered 2018-05-15: 4 [IU] via SUBCUTANEOUS
  Administered 2018-05-16: 3 [IU] via SUBCUTANEOUS
  Administered 2018-05-16 (×2): 4 [IU] via SUBCUTANEOUS
  Administered 2018-05-16 (×2): 3 [IU] via SUBCUTANEOUS
  Administered 2018-05-17: 4 [IU] via SUBCUTANEOUS
  Administered 2018-05-17: 7 [IU] via SUBCUTANEOUS
  Administered 2018-05-17 – 2018-05-18 (×3): 3 [IU] via SUBCUTANEOUS
  Administered 2018-05-18: 4 [IU] via SUBCUTANEOUS
  Administered 2018-05-18 (×2): 3 [IU] via SUBCUTANEOUS
  Administered 2018-05-18: 4 [IU] via SUBCUTANEOUS
  Administered 2018-05-19 (×3): 3 [IU] via SUBCUTANEOUS
  Administered 2018-05-19 (×2): 4 [IU] via SUBCUTANEOUS
  Administered 2018-05-20 – 2018-05-25 (×10): 3 [IU] via SUBCUTANEOUS
  Administered 2018-05-25: 4 [IU] via SUBCUTANEOUS
  Administered 2018-05-26: 3 [IU] via SUBCUTANEOUS
  Filled 2018-04-25 (×108): qty 1

## 2018-04-25 MED ORDER — ONDANSETRON HCL 4 MG/2ML IJ SOLN
INTRAMUSCULAR | Status: DC | PRN
  Administered 2018-04-25: 4 mg via INTRAVENOUS

## 2018-04-25 MED ORDER — PHENYLEPHRINE HCL 10 MG/ML IJ SOLN
INTRAMUSCULAR | Status: DC | PRN
  Administered 2018-04-25 (×3): 100 ug via INTRAVENOUS

## 2018-04-25 MED ORDER — PHENYLEPHRINE HCL 10 MG/ML IJ SOLN
INTRAMUSCULAR | Status: AC
  Filled 2018-04-25: qty 1

## 2018-04-25 MED ORDER — DILTIAZEM 12 MG/ML ORAL SUSPENSION
60.0000 mg | Freq: Four times a day (QID) | ORAL | Status: DC
  Administered 2018-04-25 – 2018-04-27 (×7): 60 mg
  Filled 2018-04-25 (×9): qty 6

## 2018-04-25 MED ORDER — PROPOFOL 10 MG/ML IV BOLUS
INTRAVENOUS | Status: AC
  Filled 2018-04-25: qty 20

## 2018-04-25 MED ORDER — DEXAMETHASONE SODIUM PHOSPHATE 10 MG/ML IJ SOLN
INTRAMUSCULAR | Status: AC
  Filled 2018-04-25: qty 1

## 2018-04-25 MED ORDER — FENTANYL CITRATE (PF) 100 MCG/2ML IJ SOLN
INTRAMUSCULAR | Status: DC | PRN
  Administered 2018-04-25: 100 ug via INTRAVENOUS
  Administered 2018-04-25: 50 ug via INTRAVENOUS
  Administered 2018-04-25: 100 ug via INTRAVENOUS

## 2018-04-25 MED ORDER — PRO-STAT SUGAR FREE PO LIQD
30.0000 mL | Freq: Two times a day (BID) | ORAL | Status: DC
  Administered 2018-04-25 – 2018-05-03 (×15): 30 mL

## 2018-04-25 MED ORDER — SODIUM CHLORIDE 0.9 % IV SOLN
INTRAVENOUS | Status: DC | PRN
  Administered 2018-04-25: 07:00:00 via INTRAVENOUS

## 2018-04-25 MED ORDER — VITAL HIGH PROTEIN PO LIQD
1000.0000 mL | ORAL | Status: DC
  Administered 2018-04-25 – 2018-05-03 (×10): 1000 mL

## 2018-04-25 MED ORDER — ROCURONIUM BROMIDE 100 MG/10ML IV SOLN
INTRAVENOUS | Status: DC | PRN
  Administered 2018-04-25 (×3): 50 mg via INTRAVENOUS

## 2018-04-25 MED ORDER — FREE WATER
200.0000 mL | Status: DC
  Administered 2018-04-25 – 2018-05-03 (×40): 200 mL

## 2018-04-25 MED ORDER — LIDOCAINE-EPINEPHRINE (PF) 1 %-1:200000 IJ SOLN
INTRAMUSCULAR | Status: AC
  Filled 2018-04-25: qty 30

## 2018-04-25 MED ORDER — LIDOCAINE-EPINEPHRINE (PF) 1 %-1:200000 IJ SOLN
INTRAMUSCULAR | Status: DC | PRN
  Administered 2018-04-25: 4 mL

## 2018-04-25 SURGICAL SUPPLY — 25 items
BLADE SURG 15 STRL LF DISP TIS (BLADE) ×1 IMPLANT
BLADE SURG 15 STRL SS (BLADE) ×1
BLADE SURG SZ11 CARB STEEL (BLADE) ×2 IMPLANT
CANISTER SUCT 1200ML W/VALVE (MISCELLANEOUS) ×2 IMPLANT
ELECT REM PT RETURN 9FT ADLT (ELECTROSURGICAL) ×2
ELECTRODE REM PT RTRN 9FT ADLT (ELECTROSURGICAL) ×1 IMPLANT
GLOVE PROTEXIS LATEX SZ 7.5 (GLOVE) ×2 IMPLANT
GOWN STRL REUS W/ TWL LRG LVL3 (GOWN DISPOSABLE) ×2 IMPLANT
GOWN STRL REUS W/TWL LRG LVL3 (GOWN DISPOSABLE) ×2
HLDR TRACH TUBE NECKBAND 18 (MISCELLANEOUS) ×1 IMPLANT
HOLDER TRACH TUBE NECKBAND 18 (MISCELLANEOUS) ×1
NS IRRIG 500ML POUR BTL (IV SOLUTION) ×2 IMPLANT
PACK HEAD/NECK (MISCELLANEOUS) ×2 IMPLANT
SHEARS HARMONIC 9CM CVD (BLADE) ×2 IMPLANT
SPONGE DRAIN TRACH 4X4 STRL 2S (GAUZE/BANDAGES/DRESSINGS) ×2 IMPLANT
SUCTION FRAZIER HANDLE 10FR (MISCELLANEOUS) ×1
SUCTION TUBE FRAZIER 10FR DISP (MISCELLANEOUS) ×1 IMPLANT
SUT ETHILON 2 0 FS 18 (SUTURE) ×2 IMPLANT
SUT SILK 2 0 (SUTURE) ×1
SUT SILK 2-0 18XBRD TIE 12 (SUTURE) ×1 IMPLANT
SUT VIC AB 4-0 RB1 27 (SUTURE) ×1
SUT VIC AB 4-0 RB1 27X BRD (SUTURE) ×1 IMPLANT
TUBE TRACH 7.0 EXL PROX CUF (TUBING) ×2 IMPLANT
TUBE TRACH SHILEY  6 DIST  CUF (TUBING) ×2 IMPLANT
TUBE TRACH SHILEY 8 DIST CUF (TUBING) ×2 IMPLANT

## 2018-04-25 NOTE — Anesthesia Postprocedure Evaluation (Signed)
Anesthesia Post Note  Patient: Pricilla RiffleOtis Leon Lewan  Procedure(s) Performed: TRACHEOSTOMY (N/A )  Patient location during evaluation: SICU Anesthesia Type: General Level of consciousness: sedated Pain management: pain level controlled Vital Signs Assessment: post-procedure vital signs reviewed and stable Respiratory status: patient remains intubated per anesthesia plan (trach) Cardiovascular status: stable Postop Assessment: no apparent nausea or vomiting Anesthetic complications: no     Last Vitals:  Vitals:   04/25/18 1000 04/25/18 1015  BP: (!) 142/93 (!) 144/93  Pulse: (!) 135 (!) 137  Resp: 14 14  Temp:    SpO2: 94% 94%    Last Pain:  Vitals:   04/25/18 0845  TempSrc: Oral                 Cleda MccreedyJoseph K Raedyn Klinck

## 2018-04-25 NOTE — Transfer of Care (Signed)
Immediate Anesthesia Transfer of Care Note  Patient: Alexander RiffleOtis Leon Alexander  Procedure(s) Performed: TRACHEOSTOMY (N/A )  Patient Location: ICU  Anesthesia Type:General  Level of Consciousness: sedated  Airway & Oxygen Therapy: Patient remains intubated per anesthesia plan and Patient placed on Ventilator (see vital sign flow sheet for setting)  Post-op Assessment: Report given to RN and Post -op Vital signs reviewed and stable  Post vital signs: Reviewed and stable  Last Vitals:  Vitals Value Taken Time  BP    Temp    Pulse    Resp    SpO2      Last Pain:  Vitals:   04/25/18 0330  TempSrc: Oral         Complications: No apparent anesthesia complications

## 2018-04-25 NOTE — Progress Notes (Signed)
Recruitment maneuvers done per dr simonds for 2 minutes. Tolerated well placed back on previous settings

## 2018-04-25 NOTE — Progress Notes (Addendum)
Dr. Elenore RotaJuengel at bedside and gave order for chest xray and abdominal xray to verify airway placement and NG tube placement.

## 2018-04-25 NOTE — Op Note (Signed)
04/25/2018 8:22 AM  Delia HeadyWilson,  Jaque [id number]  Pre-Op Dx: Prolonged intubation requiring tracheostomy  Post-Op Dx: Same  Proc:  Tracheostomy  Surg:  Beverly SessionsPaul H Averee Harb  Anes:  GOT  EBL: Minimal  Comp: None  Findings: The thyroid isthmus was overlying the trachea and had to be split with the harmonic.  Procedure:  The patient was brought from the intensive care unit to the operating room and transferred to an operating table.  Anesthesia was administered per indwelling orotracheal tube.   Neck extension was achieved as possible anda shoulder rolke was placed.  The lower neck was palpated with the findings as described above.  1% Xylocaine with 1:100,000 epinephrine, 4 cc's, was infiltrated into the surgical field for intraoperative hemostasis.  Several minutes were allowed for this to take effect. The patient was prepped in a sterile fashion with a surgical prep from the chin down to the upper chest.  Sterile draping was accomplished in the standard fashion.   A  two cm horizontal incision was made sharply a finger's breadth above the sternal notch, and extended through skin and subcutaneous fat.  Using cautery, the superficial layer of the deep cervical fascia was lysed.  Additional dissection revealed the strap muscles.  The midline raphe was divided in two layers and the muscles retracted laterally.  The pretracheal plane was visualized.  This was entered bluntly.  The thyroid isthmus was isolated and divided with the Harmonic scalpel.  The thyroid gland was retracted to either side.  The anterior face of the trachea was cleared.  In the second interspace, a transverse incision was made between cartilage rings into the tracheal lumen.  A 6 mm wide inferiorly based flap was generated and secured to the lower wound with a 4-0 chromic suture.   A previously tested  #7 Shiley XLT proximal cuffed tracheostomy tube was brought into the field.  With the endotracheal tube under direct visualization  through the tracheostomy, it was gently backed up.  The tracheostomy tube was inserted into the tracheal lumen.  Hemostasis was observed. The cuff was inflated and observed to be intact and containing pressure. The inner cannula was placed and ventilation assumed per tracheostomy tube.  Good chest wall motion was observed, and CO2 was documented per anesthesia.  The trach tube was secured in the standard fashion with trach ties. A 2-0 Nylon suture was used to secure the trach tube to the skin on both sides.  Hemostasis was observed again.  When satisfactory ventilation was assured, the orotracheal tube was removed.  At this point the procedure was completed.  The patient was returned to anesthesia, awakened as possible, and transferred back to the intensive care unit in stable condition.  Comment: 47 y.o. African-American male with prolonged ventilation was the indication for today's procedure.  Anticipate a routine postoperative recovery including standard tracheal hygiene.  The sutures should be removed in 5 days.  When the patient no longer requires ventilator or pressure support, the cuff should be deflated.  Changing to an uncuffed tube and downsizing will be according to the clinical condition of the patient.   Beverly Sessionsaul H Orvin Netter  8:22 AM 04/25/2018

## 2018-04-25 NOTE — Anesthesia Post-op Follow-up Note (Signed)
Anesthesia QCDR form completed.        

## 2018-04-25 NOTE — Progress Notes (Signed)
[place] for heparin drip management Indication: atrial fibrillation and aortic valve replacement   Allergies  Allergen Reactions  . Lexapro [Escitalopram Oxalate] Swelling    Patient Measurements: Height: 5' 10"  (177.8 cm) Weight: (!) 420 lb (190.5 kg) IBW/kg (Calculated) : 73 Heparin Dosing Weight: 111kg  Vital Signs: Temp: 98.8 F (37.1 C) (06/18 1230) Temp Source: Axillary (06/18 1230) BP: 104/79 (06/18 1315) Pulse Rate: 103 (06/18 1315)  Labs: Recent Labs    04/22/18 1425  04/23/18 0429 04/24/18 0412 04/25/18 0358  HGB  --    < > 12.2* 12.1* 10.3*  HCT  --   --  37.8* 37.4* 33.5*  PLT  --   --  196 207 174  HEPARINUNFRC 0.53  --  0.54 0.62  --   CREATININE  --   --   --  2.92* 2.43*   < > = values in this interval not displayed.    Estimated Creatinine Clearance: 63.1 mL/min (A) (by C-G formula based on SCr of 2.43 mg/dL (H)).   Medical History: Past Medical History:  Diagnosis Date  . Allergy   . Anxiety   . Arrhythmia   . Arthritis   . Atrial fibrillation ([place])   . CHF (congestive heart failure) ([place])   . CKD (chronic kidney disease)   . Diabetes mellitus without complication ([place])   . Diabetes mellitus, type II ([place])   . Heart failure ([place])   . Hyperlipidemia   . Hypertension   . Pancreatitis   . Sleep apnea     Medications:  Scheduled:  . chlorhexidine gluconate (MEDLINE KIT)  15 mL Mouth Rinse BID  . diltiazem  45 mg Per Tube Q6H  . famotidine  20 mg Per Tube Daily  . feeding supplement (PRO-STAT SUGAR FREE 64)  30 mL Per Tube BID  . fentaNYL  150 mcg Transdermal Q72H  . free water  200 mL Per Tube Q8H  . insulin aspart  0-15 Units Subcutaneous Q4H  . insulin glargine  85 Units Subcutaneous BID  . ipratropium-albuterol  3 mL Nebulization Q6H  . mouth rinse  15 mL Mouth Rinse 10 times per day  . metoprolol tartrate  50 mg Per Tube BID  . sodium chloride flush  10-40 mL Intracatheter Q12H    Infusions:  . dextrose 50 mL/hr at 04/25/18 1022  . feeding supplement (VITAL HIGH PROTEIN) 1,000 mL (04/25/18 1341)  . fentaNYL infusion INTRAVENOUS 300 mcg/hr (04/25/18 1350)  . heparin    . linezolid (ZYVOX) IV Stopped (04/25/18 1017)    Assessment: Pharmacy consulted for warfarin and heparin drip management for 47 yo male admitted to the ICU s/p cardiac arrest. Patient has history significant for aortic valve replacement, atrial fibrillation and CHF. Head CT is negative for infarction or hemorrhage.   Patient takes warfarin 43m daily as an outpatient for goal INR 2.5-3.5.    Goal of Therapy:  INR 2-3 Heparin level 0.3-0.7 units/ml Monitor platelets by anticoagulation protocol: Yes   Plan:   Heparin was held for trach tube placement. Per discussion with MD, will restart heparin drip with no bolus at 1900. Will restart at a rate of 2000 units/hr, as pt was previously managed for anticoagulation at a rate of 2300 units/hr, and can increase the rate as appropriate for a goal heparin level of 0.3-0.7 with no bolus. Will obtain heparin level at 0100 on 6/19.      CTyson Babinski6/18/2019 2:17 PM

## 2018-04-25 NOTE — Progress Notes (Signed)
Plan to re-start Heparin infusion 12 hrs after trach placed Patient has Mechanical Valve  Benefits outweigh risks    Lucie LeatherKurian David Chesni Vos, M.D.  [facility]  Medical Director [facility] Medical Director [facility] Card[facility] Department

## 2018-04-25 NOTE — Progress Notes (Signed)
Vonda Antigua, MD [address] [place], [place], [place], [address] [address], [place], [place], [place], [address] Phone: [phone]  Fax: [phone]   Subjective: Patient status post trach today.  Sedated.  Wife at bedside.     Objective: Exam: Vital signs in last 24 hours: Vitals:   04/25/18 0935 04/25/18 0945 04/25/18 1000 04/25/18 1015  BP: (!) 153/109 121/86 (!) 142/93 (!) 144/93  Pulse: (!) 135 (!) 137 (!) 135 (!) 137  Resp: _0 Temp:      TempSrc:      SpO2: 91% 93% 94% 94%  Weight:      Height:       Weight change:   Intake/Output Summary (Last 24 hours) at 04/25/2018 1142 Last data filed at 04/25/2018 1015 Gross per 24 hour  Intake 3615.09 ml  Output 3655 ml  Net -39.91 ml    General: No acute distress, Abd: Soft, NT/ND, No HSM, obese abdomen Skin: Warm, no rashes Neck: Supple, Trachea midline   Lab Results: Lab Results  Component Value Date   WBC 10.3 04/25/2018   HGB 10.3 (L) 04/25/2018   HCT 33.5 (L) 04/25/2018   MCV 89.5 04/25/2018   PLT 174 04/25/2018   Micro Results: Recent Results (from the past 240 hour(s))  Culture, respiratory (NON-Expectorated)     Status: None   Collection Time: 04/18/18  9:37 AM  Result Value Ref Range Status   Specimen Description   Final    TRACHEAL ASPIRATE Performed at [facility], [address] [place], [place] [address]    Special Requests   Final    NONE Performed at [facility], [address] [place], [place] [address]    Gram Stain   Final    MODERATE WBC PRESENT, PREDOMINANTLY PMN FEW GRAM POSITIVE COCCI IN PAIRS IN CLUSTERS Performed at [facility], [place] [address], [place], [place] [address]    Culture   Final    MODERATE METHICILLIN RESISTANT STAPHYLOCOCCUS AUREUS   Report Status 04/21/2018 FINAL  Final   Organism ID, Bacteria METHICILLIN RESISTANT STAPHYLOCOCCUS AUREUS  Final      Susceptibility   Methicillin resistant staphylococcus  aureus - MIC*    CIPROFLOXACIN >=8 RESISTANT Resistant     ERYTHROMYCIN >=8 RESISTANT Resistant     GENTAMICIN <=0.5 SENSITIVE Sensitive     OXACILLIN >=4 RESISTANT Resistant     TETRACYCLINE <=1 SENSITIVE Sensitive     VANCOMYCIN 1 SENSITIVE Sensitive     TRIMETH/SULFA <=10 SENSITIVE Sensitive     CLINDAMYCIN >=8 RESISTANT Resistant     RIFAMPIN <=0.5 SENSITIVE Sensitive     Inducible Clindamycin NEGATIVE Sensitive     * MODERATE METHICILLIN RESISTANT STAPHYLOCOCCUS AUREUS  C difficile quick scan w PCR reflex     Status: None   Collection Time: 04/19/18 11:21 AM  Result Value Ref Range Status   C Diff antigen NEGATIVE NEGATIVE Final   C Diff toxin NEGATIVE NEGATIVE Final   C Diff interpretation No C. difficile detected.  Final    Comment: Performed at [facility], [address] [place], [place] [address]  C difficile quick scan w PCR reflex     Status: None   Collection Time: 04/24/18  5:30 AM  Result Value Ref Range Status   C Diff antigen NEGATIVE NEGATIVE Final   C Diff toxin NEGATIVE NEGATIVE Final   C Diff interpretation No C. difficile detected.  Final    Comment:  Performed at [facility], [address] [place], [place] [address]   Studies/Results: Dg Abd 1 View  Result Date: 04/25/2018 CLINICAL DATA:  Check gastric catheter placement EXAM: ABDOMEN - 1 VIEW COMPARISON:  04/18/2018 FINDINGS: Gastric catheter is noted extending into the stomach. Nonobstructive bowel gas pattern is seen. No other focal abnormality is noted. IMPRESSION: Nasogastric catheter within the stomach. Electronically Signed   By: Inez Catalina M.D.   On: 04/25/2018 09:13   Dg Chest Port 1 View  Result Date: 04/25/2018 CLINICAL DATA:  Check tracheostomy placement EXAM: PORTABLE CHEST 1 VIEW COMPARISON:  04/24/2018 FINDINGS: Tracheostomy tube is noted in satisfactory position. Nasogastric catheter is noted coursing towards the stomach. The overall inspiratory effort is poor but  relatively stable from the previous exam. Patchy changes are again identified in both lungs. Right-sided PICC line is again noted and stable. IMPRESSION: Tubes and lines as described above. Patchy infiltrative changes bilaterally. Electronically Signed   By: Inez Catalina M.D.   On: 04/25/2018 09:13   Dg Chest Port 1 View  Result Date: 04/24/2018 CLINICAL DATA:  Acute respiratory failure EXAM: PORTABLE CHEST 1 VIEW COMPARISON:  Yesterday FINDINGS: Stable cardiomegaly.  Status post aortic valve replacement. Endotracheal tube tip is at the clavicular heads. An orogastric tube at least reaches the diaphragm, difficult to visualize due to body habitus. Right upper extremity PICC with tip at the upper cavoatrial junction. Low volume chest with hazy and streaky opacities. IMPRESSION: 1. Unchanged positioning of visible hardware. 2. Cardiomegaly, vascular congestion, and low volumes with atelectasis. Electronically Signed   By: Monte Fantasia M.D.   On: 04/24/2018 07:18   Medications:  Scheduled Meds: . chlorhexidine gluconate (MEDLINE KIT)  15 mL Mouth Rinse BID  . diltiazem  45 mg Per Tube Q6H  . famotidine  20 mg Per Tube Daily  . feeding supplement (PRO-STAT SUGAR FREE 64)  60 mL Per Tube TID  . fentaNYL  150 mcg Transdermal Q72H  . free water  200 mL Per Tube Q8H  . insulin aspart  0-15 Units Subcutaneous Q4H  . insulin glargine  85 Units Subcutaneous BID  . ipratropium-albuterol  3 mL Nebulization Q6H  . mouth rinse  15 mL Mouth Rinse 10 times per day  . metoprolol tartrate  50 mg Per Tube BID  . multivitamin  15 mL Per Tube Daily  . sodium chloride flush  10-40 mL Intracatheter Q12H   Continuous Infusions: . dextrose 50 mL/hr at 04/25/18 1022  . feeding supplement (VITAL HIGH PROTEIN) Stopped (04/24/18 2345)  . fentaNYL infusion INTRAVENOUS 250 mcg/hr (04/25/18 1018)  . linezolid (ZYVOX) IV Stopped (04/25/18 1017)   PRN Meds:.acetaminophen, fentaNYL, metoprolol tartrate, midazolam, sodium  chloride flush   Assessment: 47 year old male with cardiac arrest during outpatient MRI, with history of A. fib as well as ablation, aortic valve replacement on warfarin, currently on heparin drip, with inability to wean off vent post cardiac arrest, now status post trach today, with GI consulted for PEG tube placement   Plan: Patient had  Fresh tracheostomy placement today He is receiving sedative meds, therefore cannot assess mental status today to see if he is able to wake up and tolerate oral feeds appropriately  Family, wife at bedside, and PEG tube placement was extensively discussed with her Mental status can be monitored between today and tomorrow to see how well he can do with oral feeds If unable to tolerate oral feeds, PEG tube can be placed later this week depending on  clinical status  This was discussed with the wife, and risks and benefits of the procedure were discussed, including the possibility that if we are not able to find a good location for PEG tube placement, during the EGD part of the procedure, then PEG tube will not be able to be done endoscopically, and interventional radiology would be the next step for feeding tube placement.  She verbalized understanding, and will think about PEG tube placement, and provide consent if she is agreeable in 1 to 2 days.  Heparin drip will need to be held for 6 hours prior to PEG tube placement   LOS: 18 days   Vonda Antigua, MD 04/25/2018, 11:42 AM

## 2018-04-25 NOTE — Progress Notes (Signed)
Dr. Sung AmabileSimonds at bedside and aware of patient's heart rate in 130-140's afib.  MD gave order to give 5 mg of IV metoprolol now.

## 2018-04-25 NOTE — Progress Notes (Signed)
[facility] [place] [facility]   PATIENT NAME: Alexander Alexander    MR#:  [id number]  DATE OF BIRTH:  25-Jan-1971  SUBJECTIVE:  CHIEF COMPLAINT:   Chief Complaint  Patient presents with  . Back Pain  s/p tracheostomy today.   REVIEW OF SYSTEMS:  CONSTITUTIONAL: No fever, fatigue or weakness.  EYES: No blurred or double vision.  EARS, NOSE, AND THROAT: No tinnitus or ear pain.  RESPIRATORY: No cough, shortness of breath, wheezing or hemoptysis.  CARDIOVASCULAR: No chest pain, orthopnea, edema.  GASTROINTESTINAL: No nausea, vomiting, diarrhea or abdominal pain.  GENITOURINARY: No dysuria, hematuria.  ENDOCRINE: No polyuria, nocturia,  HEMATOLOGY: No anemia, easy bruising or bleeding SKIN: No rash or lesion. MUSCULOSKELETAL: No joint pain or arthritis.   NEUROLOGIC: No tingling, numbness, weakness.  PSYCHIATRY: No anxiety or depression.   ROS  DRUG ALLERGIES:   Allergies  Allergen Reactions  . Lexapro [Escitalopram Oxalate] Swelling    VITALS:  Blood pressure (!) 146/88, pulse (!) 106, temperature 99.6 F (37.6 C), temperature source Oral, resp. rate 17, height 5\' 10"  (1.778 m), weight (!) 420 lb (190.5 kg), SpO2 99 %.  PHYSICAL EXAMINATION:  GENERAL:  47 y.o.-year-old patient lying in the bed with no acute distress.  Morbid obesity. EYES: Pupils equal, round, reactive to light and accommodation. No scleral icterus. Extraocular muscles intact.  HEENT: Head atraumatic, normocephalic.  NECK:  Supple, no jugular venous distention. No thyroid enlargement, no tenderness.  LUNGS: Normal breath sounds bilaterally, no wheezing, rales,rhonchi or crepitation. No use of accessory muscles of respiration.  CARDIOVASCULAR: S1, S2 normal. No murmurs, rubs, or gallops.  ABDOMEN: Soft, nontender, nondistended. Bowel sounds present. No organomegaly or mass.  EXTREMITIES: No pedal edema, cyanosis, or clubbing.  NEUROLOGIC: Unable to exam. PSYCHIATRIC: The patient is  sedated. SKIN: No obvious rash, lesion, or ulcer.   Physical Exam LABORATORY PANEL:   CBC Recent Labs  Lab 04/25/18 0358  WBC 10.3  HGB 10.3*  HCT 33.5*  PLT 174   ------------------------------------------------------------------------------------------------------------------  Chemistries  Recent Labs  Lab 04/22/18 0527 04/24/18 0412 04/25/18 0358  NA 144 147* 147*  K 4.4 4.6 5.1  CL 108 108 110  CO2 28 30 31   GLUCOSE 178* 170* 202*  BUN 98* 96* 83*  CREATININE 3.03* 2.92* 2.43*  CALCIUM 8.9 9.2 9.0  MG 2.9*  --   --   AST  --  34  --   ALT  --  84*  --   ALKPHOS  --  64  --   BILITOT  --  1.0  --    ------------------------------------------------------------------------------------------------------------------  Cardiac Enzymes Recent Labs  Lab 04/18/18 1931  TROPONINI 0.11*   ------------------------------------------------------------------------------------------------------------------  RADIOLOGY:  Dg Abd 1 View  Result Date: 04/25/2018 CLINICAL DATA:  Check gastric catheter placement EXAM: ABDOMEN - 1 VIEW COMPARISON:  04/18/2018 FINDINGS: Gastric catheter is noted extending into the stomach. Nonobstructive bowel gas pattern is seen. No other focal abnormality is noted. IMPRESSION: Nasogastric catheter within the stomach. Electronically Signed   By: Alcide CleverMark  Lukens M.D.   On: 04/25/2018 09:13   Dg Chest Port 1 View  Result Date: 04/25/2018 CLINICAL DATA:  Check tracheostomy placement EXAM: PORTABLE CHEST 1 VIEW COMPARISON:  04/24/2018 FINDINGS: Tracheostomy tube is noted in satisfactory position. Nasogastric catheter is noted coursing towards the stomach. The overall inspiratory effort is poor but relatively stable from the previous exam. Patchy changes are again identified in both lungs. Right-sided PICC line is again noted and  stable. IMPRESSION: Tubes and lines as described above. Patchy infiltrative changes bilaterally. Electronically Signed   By: Alcide Clever M.D.   On: 04/25/2018 09:13   Dg Chest Port 1 View  Result Date: 04/24/2018 CLINICAL DATA:  Acute respiratory failure EXAM: PORTABLE CHEST 1 VIEW COMPARISON:  Yesterday FINDINGS: Stable cardiomegaly.  Status post aortic valve replacement. Endotracheal tube tip is at the clavicular heads. An orogastric tube at least reaches the diaphragm, difficult to visualize due to body habitus. Right upper extremity PICC with tip at the upper cavoatrial junction. Low volume chest with hazy and streaky opacities. IMPRESSION: 1. Unchanged positioning of visible hardware. 2. Cardiomegaly, vascular congestion, and low volumes with atelectasis. Electronically Signed   By: Marnee Spring M.D.   On: 04/24/2018 07:18    ASSESSMENT AND PLAN:  47 year old male with past medical history significant for A. fib status post ablation, aortic valve replacement on warfarin, history of congestive heart failure, CKD, morbid obesity, diabetes and sleep apnea who was getting an outpatient MRI of his lower back had a cardiac arrest.  *Acute cardiopulmonary arrest From respiratory causes Difficult weaning from vent,s/p tracheostomy. Continue ventilation.  *AcuteMRSA/VAP ContinueLinezolid,vent protocol   *Atrial fibrillationwith RVR Stable Continuemetoprolol,Cardizem, amiodarone, re-start Heparin infusion 12 hrs after trach placed.   *History of mechanical aortic valve replacement Stable Onheparin drip  *Acute toxic metabolic encephalopathy  Neurology input appreciated  Continue Kepprafor possible partial seizures  *Chronic diabetes mellitus type 2 Stable on current regiment  *acute renal failurew/ CKD  Secondary toATN, improving.  *Acute septic shock, MRSA pneumonia Resolved Plan to complete 14 days linezolid  Possible PEG tube placement this week per GI. I discussed with Dr. Belia Heman. All the records are reviewed and case discussed with Care Management/Social Workerr. Management  plans discussed with the patient, family and they are in agreement.  CODE STATUS: full  TOTAL TIME TAKING CARE OF THIS PATIENT: 35 minutes.   POSSIBLE D/C IN ? DAYS, DEPENDING ON CLINICAL CONDITION.   Shaune Pollack M.D on 04/25/2018   Between 7am to 6pm - Pager - [phone]  After 6pm go to [email or web address] - password [employer]  [facility]  Office  [phone]  CC: Primary care physician; Erasmo Downer, MD  Note: This dictation was prepared with Dragon dictation along with smaller phrase technology. Any transcriptional errors that result from this process are unintentional.

## 2018-04-25 NOTE — Progress Notes (Signed)
Dr. Sung AmabileSimonds gave order that NG tube is okay to use after looking at abdominal xray.

## 2018-04-25 NOTE — Progress Notes (Signed)
PULMONARY / CRITICAL CARE MEDICINE   Name: Alexander Alexander MRN: [id number] DOB: 18-Apr-1971    ADMISSION DATE:  04/07/2018  PT PROFILE:   48M with PMH of atrial fibrillation, status post ablation and aortic valve replacement on long term warfarin, CHF, CKD, diabetes, obstructive sleep apnea and hypertension, suffered a pulseless VT cardiac arrest in MRI when he was being evaluated for back pain and sciatica, subsequently intubated in the intensive care unit. Difficult airway noted  EVENTS/RESULTS: 06/11 CT chest: Dense RLL consolidation.  Segmental RML and LLL atelectasis.  Patchy GGO in upper lobes bilaterally 06/12 rough night with desaturation.  Transient bradycardia.  06/13 More responsive.  Gas exchange improving.  No further bradycardia.  Amiodarone has been stopped. AFRVR (up to 150/min).  Follows commands.  Remains on fentanyl infusion.  Propofol discontinued. 06/17 Gas exchange improving. Rate adequately controlled. Cognition unchanged. Remains on fentanyl infusion. Gastroenterology consultation requested for G tube placement 06/18 Trach tube placement. Post op hypoxemia. Vent changes made   INDWELLING DEVICES:: R IJ CVL 05/31 >> 06/10 ETT 05/31 >> 06/18 RUE PICC 06/10 >>  Trach tube 06/18 >>    MICRO DATA: MRSA PCR 05/31 >> NEG Urine 06/01 >> NEG Resp 06/01 >> MRSA Blood 06/01 >> NEG C diff 06/04 >> NEG  Urine 06/05 >> NEG Resp 06/05 >> MRSA Blood 06/05 >> NEG Resp 06/11 >> MRSA C diff 6/17 >> NEG  ANTIMICROBIALS:  Pip-tazo 05/31 >> 06/03 Vanc 05/31 >> 6/12 Cefepime 06/11 >> 06/15 Linezolid 06/12 >>   SUBJECTIVE:  Status post tracheostomy tube today.  Postop hypoxemia.  Ventilator changes made. RASS -3. Not F/C  VITAL SIGNS: BP 104/79   Pulse (!) 103   Temp 98.8 F (37.1 C) (Axillary)   Resp 13   Ht 5\' 10"  (1.778 m)   Wt (!) 420 lb (190.5 kg)   SpO2 99%   BMI 60.26 kg/m   HEMODYNAMICS:    VENTILATOR SETTINGS: Vent Mode: PCV FiO2 (%):  [55 %-75  %] 75 % Set Rate:  [10 bmp-16 bmp] 16 bmp PEEP:  [5 cmH20-10 cmH20] 10 cmH20 Plateau Pressure:  [19 cmH20-20 cmH20] 19 cmH20  INTAKE / OUTPUT: I/O last 3 completed shifts: In: 4459.5 [I.V.:2472; NG/GT:1687.5; IV Piggyback:300] Out: 6280 [Urine:5955; Emesis/NG output:100; Stool:225]  PHYSICAL EXAMINATION: General: Full vent support, sedated Neuro: CNs intact, moves all extremities HEENT: NCAT, sclerae white Cardiovascular: IRIR, tachy, no M Lungs: Rhonchi, no wheezes Abdomen: NT, ND, NABS, soft Ext: Mild symmetric LE edema  LABS:  BMET Recent Labs  Lab 04/22/18 0527 04/24/18 0412 04/25/18 0358  NA 144 147* 147*  K 4.4 4.6 5.1  CL 108 108 110  CO2 28 30 31   BUN 98* 96* 83*  CREATININE 3.03* 2.92* 2.43*  GLUCOSE 178* 170* 202*    Electrolytes Recent Labs  Lab 04/18/18 1747  04/22/18 0527 04/24/18 0412 04/25/18 0358  CALCIUM 7.9*   < > 8.9 9.2 9.0  MG 2.3  --  2.9*  --   --   PHOS 5.3*  --  3.9  --   --    < > = values in this interval not displayed.    CBC Recent Labs  Lab 04/23/18 0429 04/24/18 0412 04/25/18 0358  WBC 14.7* 14.7* 10.3  HGB 12.2* 12.1* 10.3*  HCT 37.8* 37.4* 33.5*  PLT 196 207 174    Coag's Recent Labs  Lab 04/18/18 1552  APTT 83*  INR 1.37    Sepsis Markers Recent Labs  Lab  04/20/18 0306 04/21/18 0440 04/22/18 0527  PROCALCITON 41.34 29.96 21.80    ABG Recent Labs  Lab 04/22/18 1115 04/23/18 0429 04/24/18 0412  PHART 7.25* 7.22* 7.27*  PCO2ART 77* 84* 77*  PO2ART 69* 58* 64*    Liver Enzymes Recent Labs  Lab 04/21/18 0440 04/24/18 0412  AST 135* 34  ALT 166* 84*  ALKPHOS 69 64  BILITOT 1.7* 1.0  ALBUMIN 2.5* 2.6*    Cardiac Enzymes Recent Labs  Lab 04/18/18 1931  TROPONINI 0.11*    Glucose Recent Labs  Lab 04/24/18 1551 04/24/18 1938 04/24/18 2317 04/25/18 0336 04/25/18 0833 04/25/18 1159  GLUCAP 141* 211* 202* 197* 157* 164*    CXR: Low lung volumes, platelike ATX in right  midlung   ASSESSMENT / PLAN:  PULMONARY A: Respiratory arrest 05/31 Prolonged ventilator dependence Persistently high FiO2/PEEP requirements RLL consolidation/PNA Tracheostomy status P:   Cont vent support -tolerates PCV mode better than other full support modes Cont vent bundle Daily SBT if/when meets criteria Continue nebulized bronchodilators  CARDIOVASCULAR A:  VT cardiac arrest 5/31 (deemed secondary to respiratory rest) Chronic atrial fibrillation with RVR S/P mechanical aortic valve replacement P:  MAP goal > 65 mmHg Resume heparin infusion this evening Continue enteral metoprolol initiated 6/11 Continue enteral diltiazem, initiated 6/17, dose increased 6/18  RENAL A:   CKD, baseline creatinine 2.0-2.5 Nonoliguric AKI, resolved Mild hypervolemia Mild hypernatremia P:   Monitor BMET intermittently Monitor I/Os Correct electrolytes as indicated  Free water increased 6/18  GASTROINTESTINAL A:   Abdominal distention, resolved P:   SUP: Enteral famotidine Resume TF protocol (post procedure)  HEMATOLOGIC A:   Very mild ICU acquired anemia P:  DVT px: heparin infusion (AF, mechanical AVR) Monitor CBC intermittently Transfuse per usual guidelines   INFECTIOUS A:   MRSA pneumonia Resolving severe sepsis P:   Monitor temp, WBC count Micro and abx as above  Plan to complete 14 days linezolid  ENDOCRINE A:   DM 2 with severe insulin resistance P:   Continue Lantus  Continue SSI -changed back to resistance scale 6/18  NEUROLOGIC A:   Acute encephalopathy ICU/vent associated discomfort P:   RASS goal: 0, -1 Continue Duragesic patch Initiate clonazepam 6/18 Plan DC fentanyl infusion a.m. 6/19   FAMILY: Wife updated at bedside  CCM time:  40 mins The above time includes time spent in consultation with patient and/or family members and reviewing care plan on multidisciplinary rounds  Alexander Fischer, MD PCCM service Mobile  [phone] Pager [phone] 04/25/2018 2:21 PM

## 2018-04-25 NOTE — Progress Notes (Signed)
Nutrition Follow-up  DOCUMENTATION CODES:   Morbid obesity  INTERVENTION:  Initiate new goal TF regimen of Vital High Protein at 80 mL/hr (1920 mL goal daily volume) + Pro-Stat 30 mL BID per NGT. Provides 2120 kcal (98% estimated needs), 198 grams of protein, 215 grams of carbohydrate, 1613 mL H2O daily.  When tube feeds run at goal rate patient will receive 9 grams of carbohydrates per hour.  Will discontinue liquid MVI per tube as new goal TF regimen meets 100% RDIs for vitamins/minerals.  With free water flush of 200 mL Q8hrs patient will receive a total of 2213 mL H2O daily including water in tube feeding.  NUTRITION DIAGNOSIS:   Inadequate oral intake related to inability to eat as evidenced by NPO status.  Ongoing - addressing with TF regimen.  GOAL:   Provide needs based on ASPEN/SCCM guidelines  Met with TF regimen.  MONITOR:   Vent status, Labs, Weight trends, Skin, TF tolerance, I & O's  REASON FOR ASSESSMENT:   Ventilator, Consult Enteral/tube feeding initiation and management  ASSESSMENT:   47 year old male with PMHx of A-fib s/p ablation and mechanical aortic valve replacement on warfarin, HTN, DM type 2, CHF, OSA, anxiety, CKD, HLD, pancreatitis who presented with lower back pain and sciatica. While in MRI on 5/31 suite patient suffered pulseless V. tach cardiac arrest with 5 minutes ACLS before ROSC obtained, and was intubated.   -Patient s/p placement of tracheostomy tube today. -OGT was removed this morning for tracheostomy tube placement. NGT was placed. -Plan will be for PEG tube to be placed either later this week if patient remains here, or PEG tube may also be placed at [facility].  Patient had returned from tracheostomy tube placement at time of RD assessment and was receiving mechanical ventilation through new trach. Plan is for weaning of continuous sedation. Abdomen feels soft. Wife at bedside.  Patient has now been underfed for 2 weeks per guidelines.  Will start increasing estimated needs go meet his [facility] starting with 80% of PSU. Recommend slow increase due to high insulin requirements - will continue to monitor.  Access: NGT placed 6/18 in right nare; terminates in stomach per abdominal x-ray 6/18; 65 cm at right nare  TF: pt has been tolerating Vital High Protein at 50 + Pro-Stat 60 mL TID; tube feeds have been off since midnight for tracheostomy tube placement  Patient is currently intubated on ventilator support Ve: 7.3 L/min Temp (24hrs), Avg:98.9 F (37.2 C), Min:98.6 F (37 C), Max:99.4 F (37.4 C)  Propofol: N/A  Medications reviewed and include: diltiazem, famotidine, fentanyl patch, free water flush 200 mL Q8hrs, Novolog 0-15 units Q4hrs (received 18 units past 24hrs), Lantus 85 units BID (dose increased form 80 units BID on 6/14), liquid MVI daily per tube, D5W at 50 mL/hr (60 grams dextrose, 204 kcal daily), fentanyl gtt, linezolid.  Labs reviewed: CBG 141-202 past 24 hrs, Sodium 147, BUN 83, Creatinine 2.43.  I/O: 4530 mL UOP yesterday (1 mL/kg/hr); 25 mL output from rectal tube yesterday  Weight trend: 190.5 kg on 6/18; +22.7 kg from admission; as edema has improved recent weights are likely not accurate; will use 167.8 kg from 6/1 to estimate needs  Discussed with RN and on rounds.  Diet Order:   Diet Order    None      EDUCATION NEEDS:   No education needs have been identified at this time  Skin:  Skin Assessment: Skin Integrity Issues: Skin Integrity Issues:: Incisions  Stage II: N/A - no longer being documented Incisions: closed incision to neck  Last BM:  04/24/2018 - type 7 per rectal tube  Height:   Ht Readings from Last 1 Encounters:  04/21/18 5' 10"  (1.778 m)    Weight:   Wt Readings from Last 1 Encounters:  04/25/18 (!) 420 lb (190.5 kg)    Ideal Body Weight:  75.5 kg  BMI:  Body mass index is 60.26 kg/m.  Estimated Nutritional Needs:   Kcal:  2171 (80% PSU2003b w/  MSJ 2556)  Protein:  189 grams (2.5 grams/kg IBW)  Fluid:  1.8 L/day (25 mL/kg IBW)  Willey Blade, MS, RD, LDN Office: [phone] Pager: [phone] After Hours/Weekend Pager: [phone]

## 2018-04-25 NOTE — Progress Notes (Signed)
Report given to Dava NajjarSusan Frazier, CRNA at bedside.  Patient left ICU with orderly, CRNA and Dr. Randa NgoPiscitello going to OR.

## 2018-04-26 DIAGNOSIS — Z931 Gastrostomy status: Secondary | ICD-10-CM

## 2018-04-26 DIAGNOSIS — G9340 Encephalopathy, unspecified: Secondary | ICD-10-CM

## 2018-04-26 DIAGNOSIS — Z930 Tracheostomy status: Secondary | ICD-10-CM

## 2018-04-26 LAB — BASIC METABOLIC PANEL
Anion gap: 8 (ref 5–15)
BUN: 94 mg/dL — ABNORMAL HIGH (ref 6–20)
CHLORIDE: 108 mmol/L (ref 101–111)
CO2: 30 mmol/L (ref 22–32)
Calcium: 9.1 mg/dL (ref 8.9–10.3)
Creatinine, Ser: 2.67 mg/dL — ABNORMAL HIGH (ref 0.61–1.24)
GFR calc non Af Amer: 27 mL/min — ABNORMAL LOW (ref >60)
GFR, EST AFRICAN AMERICAN: 31 mL/min — AB (ref >60)
Glucose, Bld: 231 mg/dL — ABNORMAL HIGH (ref 65–99)
POTASSIUM: 5.1 mmol/L (ref 3.5–5.1)
SODIUM: 146 mmol/L — AB (ref 135–145)

## 2018-04-26 LAB — GLUCOSE, CAPILLARY
GLUCOSE-CAPILLARY: 236 mg/dL — AB (ref 65–99)
GLUCOSE-CAPILLARY: 201 mg/dL — AB (ref 65–99)
Glucose-Capillary: 212 mg/dL — ABNORMAL HIGH (ref 65–99)
Glucose-Capillary: 216 mg/dL — ABNORMAL HIGH (ref 65–99)
Glucose-Capillary: 219 mg/dL — ABNORMAL HIGH (ref 65–99)
Glucose-Capillary: 228 mg/dL — ABNORMAL HIGH (ref 65–99)

## 2018-04-26 LAB — CBC
HEMATOCRIT: 38.5 % — AB (ref 40.0–52.0)
HEMOGLOBIN: 12.3 g/dL — AB (ref 13.0–18.0)
MCH: 27.4 pg (ref 26.0–34.0)
MCHC: 32 g/dL (ref 32.0–36.0)
MCV: 85.8 fL (ref 80.0–100.0)
Platelets: 203 10*3/uL (ref 150–440)
RBC: 4.49 MIL/uL (ref 4.40–5.90)
RDW: 18.9 % — ABNORMAL HIGH (ref 11.5–14.5)
WBC: 13.4 10*3/uL — AB (ref 3.8–10.6)

## 2018-04-26 LAB — HEPARIN LEVEL (UNFRACTIONATED)
Heparin Unfractionated: 0.36 IU/mL (ref 0.30–0.70)
Heparin Unfractionated: 0.45 IU/mL (ref 0.30–0.70)
Heparin Unfractionated: 0.54 IU/mL (ref 0.30–0.70)

## 2018-04-26 MED ORDER — DEXMEDETOMIDINE HCL IN NACL 400 MCG/100ML IV SOLN
0.0000 ug/kg/h | INTRAVENOUS | Status: DC
  Administered 2018-04-26: 1 ug/kg/h via INTRAVENOUS
  Administered 2018-04-26: 0.5 ug/kg/h via INTRAVENOUS
  Administered 2018-04-26: 1 ug/kg/h via INTRAVENOUS
  Administered 2018-04-26: 0.8 ug/kg/h via INTRAVENOUS
  Administered 2018-04-26 (×2): 0.7 ug/kg/h via INTRAVENOUS
  Administered 2018-04-27 – 2018-04-28 (×10): 0.8 ug/kg/h via INTRAVENOUS
  Administered 2018-04-28: 1 ug/kg/h via INTRAVENOUS
  Administered 2018-04-28: 0.8 ug/kg/h via INTRAVENOUS
  Filled 2018-04-26 (×18): qty 100

## 2018-04-26 MED ORDER — QUETIAPINE FUMARATE 25 MG PO TABS
100.0000 mg | ORAL_TABLET | Freq: Every day | ORAL | Status: DC
  Administered 2018-04-26 – 2018-05-08 (×13): 100 mg
  Filled 2018-04-26 (×13): qty 4

## 2018-04-26 MED ORDER — BUDESONIDE 0.25 MG/2ML IN SUSP
0.2500 mg | Freq: Four times a day (QID) | RESPIRATORY_TRACT | Status: DC
Start: 2018-04-26 — End: 2018-05-19
  Administered 2018-04-26 – 2018-05-19 (×91): 0.25 mg via RESPIRATORY_TRACT
  Filled 2018-04-26 (×91): qty 2

## 2018-04-26 MED ORDER — LORAZEPAM 2 MG/ML IJ SOLN
0.5000 mg | INTRAMUSCULAR | Status: DC | PRN
  Administered 2018-04-26: 0.5 mg via INTRAVENOUS
  Filled 2018-04-26: qty 1

## 2018-04-26 MED ORDER — INSULIN ASPART 100 UNIT/ML ~~LOC~~ SOLN
SUBCUTANEOUS | Status: AC
  Administered 2018-04-26: 7 [IU] via SUBCUTANEOUS
  Filled 2018-04-26: qty 1

## 2018-04-26 MED ORDER — CLONAZEPAM 1 MG PO TABS
1.0000 mg | ORAL_TABLET | Freq: Every day | ORAL | Status: DC
  Administered 2018-04-26 – 2018-05-15 (×20): 1 mg
  Filled 2018-04-26 (×20): qty 1

## 2018-04-26 MED ORDER — CLONAZEPAM 1 MG PO TABS
2.0000 mg | ORAL_TABLET | Freq: Every day | ORAL | Status: DC
  Administered 2018-04-26 – 2018-05-12 (×16): 2 mg via ORAL
  Filled 2018-04-26 (×16): qty 2

## 2018-04-26 NOTE — Progress Notes (Signed)
Dr. Sung AmabileSimonds present and RN made MD aware that patient has been agitated kicking the foot board, combative with staff and talking over his trach.  Able to calm patient at times with talking to him but patient does not follow commands and precedex is maxxed at 0.7 mcg/kg/H.  MD gave order to change max dose of precedex to 1 mcg/kg/H.

## 2018-04-26 NOTE — Progress Notes (Signed)
Inpatient Diabetes Program Recommendations  AACE/ADA: New Consensus Statement on Inpatient Glycemic Control (2015)  Target Ranges:  Prepandial:   less than 140 mg/dL      Peak postprandial:   less than 180 mg/dL (1-2 hours)      Critically ill patients:  140 - 180 mg/dL   Results for Alexander Alexander, Alexander Alexander (MRN [id number]) as of 04/26/2018 11:45  Ref. Range 04/25/2018 23:15 04/26/2018 03:21 04/26/2018 07:36 04/26/2018 11:05  Glucose-Capillary Latest Ref Range: 65 - 99 mg/dL 782195 (H)  4 units NOVOLOG  219 (H)  7 units NOVOLOG  236 (H)  7 units NOVOLOG  201 (H)    HomeDM Meds: Levemir 65unitsBID  Novolog 40 unitsTID  Amaryl 2 mg daily  Current Orders: Lantus 85 units BID      Novolog Resistant Correction Scale/ SSI (0-20 units) Q4 hours     Patient currently receiving Vital High Protein tube feeds at 80cc/hour.  CBGs elevated >200 mg/dl since 3am today.      MD- Please consider adding Novolog Tube Feed coverage to current Insulin regimen:  Novolog 4 units Q4 hours (Hold if tube feeds held for any reason)      --Will follow patient during hospitalization--  Ambrose FinlandJeannine Johnston Kemesha Mosey RN, MSN, CDE Diabetes Coordinator Inpatient Glycemic Control Team Team Pager: [phone] (8a-5p)

## 2018-04-26 NOTE — Progress Notes (Signed)
[place] for heparin drip management Indication: atrial fibrillation and aortic valve replacement   Allergies  Allergen Reactions  . Lexapro [Escitalopram Oxalate] Swelling    Patient Measurements: Height: _0  (177.8 cm) Weight: (!) 420 lb (190.5 kg) IBW/kg (Calculated) : 73 Heparin Dosing Weight: 111kg  Vital Signs: Temp: 100.9 F (38.3 C) (06/18 2320) Temp Source: Oral (06/18 2320) BP: 92/75 (06/19 0030) Pulse Rate: 88 (06/19 0030)  Labs: Recent Labs    04/23/18 0429 04/24/18 0412 04/25/18 0358 04/26/18 0059  HGB 12.2* 12.1* 10.3*  --   HCT 37.8* 37.4* 33.5*  --   PLT 196 207 174  --   HEPARINUNFRC 0.54 0.62  --  0.36  CREATININE  --  2.92* 2.43*  --     Estimated Creatinine Clearance: 63.1 mL/min (A) (by C-G formula based on SCr of 2.43 mg/dL (H)).   Medical History: Past Medical History:  Diagnosis Date  . Allergy   . Anxiety   . Arrhythmia   . Arthritis   . Atrial fibrillation ([place])   . CHF (congestive heart failure) ([place])   . CKD (chronic kidney disease)   . Diabetes mellitus without complication ([place])   . Diabetes mellitus, type II ([place])   . Heart failure ([place])   . Hyperlipidemia   . Hypertension   . Pancreatitis   . Sleep apnea     Medications:  Scheduled:  . chlorhexidine gluconate (MEDLINE KIT)  15 mL Mouth Rinse BID  . clonazePAM  1 mg Per Tube BID  . diltiazem  60 mg Per Tube Q6H  . famotidine  20 mg Per Tube Daily  . feeding supplement (PRO-STAT SUGAR FREE 64)  30 mL Per Tube BID  . fentaNYL  150 mcg Transdermal Q72H  . free water  200 mL Per Tube Q4H  . insulin aspart  0-20 Units Subcutaneous Q4H  . insulin glargine  85 Units Subcutaneous BID  . ipratropium-albuterol  3 mL Nebulization Q6H  . mouth rinse  15 mL Mouth Rinse 10 times per day  . metoprolol tartrate  50 mg Per Tube BID  . sodium chloride flush  10-40 mL Intracatheter Q12H   Infusions:  . dextrose 50 mL/hr at 04/25/18 1958   . feeding supplement (VITAL HIGH PROTEIN) 1,000 mL (04/25/18 1341)  . fentaNYL infusion INTRAVENOUS 200 mcg/hr (04/25/18 2302)  . heparin 2,000 Units/hr (04/25/18 1958)  . linezolid (ZYVOX) IV Stopped (04/25/18 2313)    Assessment: Pharmacy consulted for warfarin and heparin drip management for 47 yo male admitted to the ICU s/p cardiac arrest. Patient has history significant for aortic valve replacement, atrial fibrillation and CHF. Head CT is negative for infarction or hemorrhage.   Patient takes warfarin 38m daily as an outpatient for goal INR 2.5-3.5.    Goal of Therapy:  INR 2-3 Heparin level 0.3-0.7 units/ml Monitor platelets by anticoagulation protocol: Yes   Plan:  06/19 @ 0100 HL 0.36 therapeutic. Will continue rate at 2000 units/hr and will recheck level @ 0700.  DTobie Lords PharmD Clinical Pharmacist 04/26/2018 1:35 AM

## 2018-04-26 NOTE — Progress Notes (Signed)
PULMONARY / CRITICAL CARE MEDICINE   Name: Alexander Alexander MRN: [id number] DOB: 07-Mar-1971    ADMISSION DATE:  04/07/2018  PT PROFILE:   89M with PMH of atrial fibrillation, status post ablation and aortic valve replacement on long term warfarin, CHF, CKD, diabetes, obstructive sleep apnea and hypertension, suffered a pulseless VT cardiac arrest in MRI when he was being evaluated for back pain and sciatica, subsequently intubated in the intensive care unit. Difficult airway noted  EVENTS/RESULTS: 06/11 CT chest: Dense RLL consolidation.  Segmental RML and LLL atelectasis.  Patchy GGO in upper lobes bilaterally 06/12 rough night with desaturation.  Transient bradycardia.  06/13 More responsive.  Gas exchange improving.  No further bradycardia.  Amiodarone has been stopped. AFRVR (up to 150/min).  Follows commands.  Remains on fentanyl infusion.  Propofol discontinued. 06/17 Gas exchange improving. Rate adequately controlled. Cognition unchanged. Remains on fentanyl infusion. Gastroenterology consultation requested for G tube placement 06/18 Trach tube placement. Post op hypoxemia. Vent changes made 06/19 Transition off of continuous fentanyl. Dexmedetomidine infusion initiated  INDWELLING DEVICES:: R IJ CVL 05/31 >> 06/10 ETT 05/31 >> 06/18 RUE PICC 06/10 >>  Trach tube 06/18 >>    MICRO DATA: MRSA PCR 05/31 >> NEG Urine 06/01 >> NEG Resp 06/01 >> MRSA Blood 06/01 >> NEG C diff 06/04 >> NEG  Urine 06/05 >> NEG Resp 06/05 >> MRSA Blood 06/05 >> NEG Resp 06/11 >> MRSA C diff 6/17 >> NEG  ANTIMICROBIALS:  Pip-tazo 05/31 >> 06/03 Vanc 05/31 >> 6/12 Cefepime 06/11 >> 06/15 Linezolid 06/12 >>   SUBJECTIVE:  Intermittently agitated.  Intermittently follows commands.  VITAL SIGNS: BP 103/73   Pulse (!) 195   Temp (!) 100.4 F (38 C) (Oral)   Resp 20   Ht 5\' 10"  (1.778 m)   Wt (!) 411 lb (186.4 kg)   SpO2 90%   BMI 58.97 kg/m   HEMODYNAMICS:    VENTILATOR  SETTINGS: Vent Mode: PCV FiO2 (%):  [50 %-75 %] 50 % Set Rate:  [16 bmp] 16 bmp PEEP:  [10 cmH20] 10 cmH20 Plateau Pressure:  [27 cmH20-30 cmH20] 30 cmH20  INTAKE / OUTPUT: I/O last 3 completed shifts: In: 6732.7 [I.V.:3199.8; NG/GT:2932.8; IV Piggyback:600] Out: 4875 [Urine:4875]  PHYSICAL EXAMINATION: General: Intermittently agitated, + F/C Neuro: CNs intact, moves all extremities HEENT: NCAT, sclerae white Cardiovascular: IRIR, mildly tachy, no M Lungs: Few rhonchi, no wheezes Abdomen: NT, ND, NABS, soft Ext: No LE edema  LABS:  BMET Recent Labs  Lab 04/24/18 0412 04/25/18 0358 04/26/18 0341  NA 147* 147* 146*  K 4.6 5.1 5.1  CL 108 110 108  CO2 30 31 30   BUN 96* 83* 94*  CREATININE 2.92* 2.43* 2.67*  GLUCOSE 170* 202* 231*    Electrolytes Recent Labs  Lab 04/22/18 0527 04/24/18 0412 04/25/18 0358 04/26/18 0341  CALCIUM 8.9 9.2 9.0 9.1  MG 2.9*  --   --   --   PHOS 3.9  --   --   --     CBC Recent Labs  Lab 04/24/18 0412 04/25/18 0358 04/26/18 0341  WBC 14.7* 10.3 13.4*  HGB 12.1* 10.3* 12.3*  HCT 37.4* 33.5* 38.5*  PLT 207 174 203    Coag's No results for input(s): APTT, INR in the last 168 hours.  Sepsis Markers Recent Labs  Lab 04/20/18 0306 04/21/18 0440 04/22/18 0527  PROCALCITON 41.34 29.96 21.80    ABG Recent Labs  Lab 04/22/18 1115 04/23/18 0429 04/24/18 [id number]  PHART 7.25* 7.22* 7.27*  PCO2ART 77* 84* 77*  PO2ART 69* 58* 64*    Liver Enzymes Recent Labs  Lab 04/21/18 0440 04/24/18 0412  AST 135* 34  ALT 166* 84*  ALKPHOS 69 64  BILITOT 1.7* 1.0  ALBUMIN 2.5* 2.6*    Cardiac Enzymes No results for input(s): TROPONINI, PROBNP in the last 168 hours.  Glucose Recent Labs  Lab 04/25/18 1542 04/25/18 2003 04/25/18 2315 04/26/18 0321 04/26/18 0736 04/26/18 1105  GLUCAP 148* 150* 195* 219* 236* 201*    CXR: No new film   ASSESSMENT / PLAN:  PULMONARY A: Respiratory arrest 05/31 Prolonged  ventilator dependence Tracheostomy status Persistently high FiO2/PEEP requirements RLL consolidation/PNA P:   Cont vent support -tolerates PCV mode better than PRVC Cont vent bundle Daily SBT if/when meets criteria Continue nebulized bronchodilators Add nebulized steroids 06/19  CARDIOVASCULAR A:  VT cardiac arrest 05/31 (deemed secondary to respiratory rest) Chronic atrial fibrillation with RVR - rate control improved S/P mechanical aortic valve replacement P:  MAP goal > 65 mmHg Continue heparin infusion until after G-tube placement Transition to warfarin after all procedures completed Continue enteral metoprolol initiated 6/11 Continue enteral diltiazem, initiated 6/17, dose increased 6/18  RENAL A:   CKD, baseline creatinine 2.0-2.5 Nonoliguric AKI Mild hypervolemia, resolved Mild hypernatremia P:   Monitor BMET intermittently Monitor I/Os Correct electrolytes as indicated  Free water increased 6/18  GASTROINTESTINAL A:   Abdominal distention, resolved P:   SUP: Enteral famotidine Continue TF protocol  HEMATOLOGIC A:   Very mild ICU acquired anemia P:  DVT px: heparin infusion (AF, mechanical AVR) Monitor CBC intermittently Transfuse per usual guidelines  Minimize blood draws  INFECTIOUS A:   MRSA pneumonia Severe sepsis, resolved P:   Monitor temp, WBC count Micro and abx as above  Plan to complete 14 days linezolid (through 6/25)  ENDOCRINE A:   DM 2 with severe insulin resistance P:   Continue Lantus  Continue SSI, resistance scale  NEUROLOGIC A:   Acute encephalopathy ICU/vent associated discomfort P:   RASS goal: 0, -1 Continue Duragesic patch Continue clonazepam initiated 6/18 Fentanyl infusion discontinued 6/19 Will use dexmedetomidine to transition off of fentanyl 6/19   FAMILY: Wife updated at bedside  CCM time:  35 mins The above time includes time spent in consultation with patient and/or family members and  reviewing care plan on multidisciplinary rounds  Billy Fischer, MD PCCM service Mobile [phone] Pager [phone] 04/26/2018 12:35 PM

## 2018-04-26 NOTE — Progress Notes (Signed)
[place] for heparin drip management Indication: atrial fibrillation and aortic valve replacement   Allergies  Allergen Reactions  . Lexapro [Escitalopram Oxalate] Swelling    Patient Measurements: Height: _0  (177.8 cm) Weight: (!) 411 lb (186.4 kg) IBW/kg (Calculated) : 73 Heparin Dosing Weight: 111kg  Vital Signs: Temp: 101.6 F (38.7 C) (06/19 0545) Temp Source: Oral (06/19 0545) BP: 134/97 (06/19 0700) Pulse Rate: 106 (06/19 0700)  Labs: Recent Labs    04/24/18 0412 04/25/18 0358 04/26/18 0059 04/26/18 0341 04/26/18 0643  HGB 12.1* 10.3*  --  12.3*  --   HCT 37.4* 33.5*  --  38.5*  --   PLT 207 174  --  203  --   HEPARINUNFRC 0.62  --  0.36  --  0.45  CREATININE 2.92* 2.43*  --  2.67*  --     Estimated Creatinine Clearance: 56.7 mL/min (A) (by C-G formula based on SCr of 2.67 mg/dL (H)).   Medical History: Past Medical History:  Diagnosis Date  . Allergy   . Anxiety   . Arrhythmia   . Arthritis   . Atrial fibrillation ([place])   . CHF (congestive heart failure) ([place])   . CKD (chronic kidney disease)   . Diabetes mellitus without complication ([place])   . Diabetes mellitus, type II ([place])   . Heart failure ([place])   . Hyperlipidemia   . Hypertension   . Pancreatitis   . Sleep apnea     Medications:  Scheduled:  . chlorhexidine gluconate (MEDLINE KIT)  15 mL Mouth Rinse BID  . clonazePAM  1 mg Per Tube BID  . diltiazem  60 mg Per Tube Q6H  . famotidine  20 mg Per Tube Daily  . feeding supplement (PRO-STAT SUGAR FREE 64)  30 mL Per Tube BID  . fentaNYL  150 mcg Transdermal Q72H  . free water  200 mL Per Tube Q4H  . insulin aspart  0-20 Units Subcutaneous Q4H  . insulin glargine  85 Units Subcutaneous BID  . ipratropium-albuterol  3 mL Nebulization Q6H  . mouth rinse  15 mL Mouth Rinse 10 times per day  . metoprolol tartrate  50 mg Per Tube BID  . sodium chloride flush  10-40 mL Intracatheter Q12H    Infusions:  . dextrose 50 mL/hr at 04/26/18 0606  . feeding supplement (VITAL HIGH PROTEIN) 1,000 mL (04/26/18 0349)  . heparin 2,000 Units/hr (04/25/18 1958)  . linezolid (ZYVOX) IV Stopped (04/25/18 2313)    Assessment: Pharmacy consulted for warfarin and heparin drip management for 47 yo male admitted to the ICU s/p cardiac arrest. Patient has history significant for aortic valve replacement, atrial fibrillation and CHF. Head CT is negative for infarction or hemorrhage.   Patient takes warfarin 62m daily as an outpatient for goal INR 2.5-3.5.    Goal of Therapy:  INR 2-3 Heparin level 0.3-0.7 units/ml Monitor platelets by anticoagulation protocol: Yes   Plan:  06/19 @ 0500 HL 0.45 therapeutic. Will continue rate of 2000 units/hr and will recheck HL @ 1300, CBC stable.  DTobie Lords PharmD Clinical Pharmacist 04/26/2018 7:29 AM

## 2018-04-26 NOTE — Progress Notes (Signed)
[place] for heparin drip management Indication: atrial fibrillation and aortic valve replacement   Allergies  Allergen Reactions  . Lexapro [Escitalopram Oxalate] Swelling    Patient Measurements: Height: '5\' 10"'$  (177.8 cm) Weight: (!) 411 lb (186.4 kg) IBW/kg (Calculated) : 73 Heparin Dosing Weight: 111kg  Vital Signs: Temp: 100.3 F (37.9 C) (06/19 1526) Temp Source: Oral (06/19 1526) BP: 122/84 (06/19 1530) Pulse Rate: 90 (06/19 1530)  Labs: Recent Labs    04/24/18 0412 04/25/18 0358 04/26/18 0059 04/26/18 0341 04/26/18 0643 04/26/18 1305  HGB 12.1* 10.3*  --  12.3*  --   --   HCT 37.4* 33.5*  --  38.5*  --   --   PLT 207 174  --  203  --   --   HEPARINUNFRC 0.62  --  0.36  --  0.45 0.54  CREATININE 2.92* 2.43*  --  2.67*  --   --     Estimated Creatinine Clearance: 56.7 mL/min (A) (by C-G formula based on SCr of 2.67 mg/dL (H)).   Medical History: Past Medical History:  Diagnosis Date  . Allergy   . Anxiety   . Arrhythmia   . Arthritis   . Atrial fibrillation ([place])   . CHF (congestive heart failure) ([place])   . CKD (chronic kidney disease)   . Diabetes mellitus without complication ([place])   . Diabetes mellitus, type II ([place])   . Heart failure ([place])   . Hyperlipidemia   . Hypertension   . Pancreatitis   . Sleep apnea     Medications:  Scheduled:  . budesonide (PULMICORT) nebulizer solution  0.25 mg Nebulization Q6H  . chlorhexidine gluconate (MEDLINE KIT)  15 mL Mouth Rinse BID  . clonazePAM  1 mg Per Tube Daily  . clonazePAM  2 mg Oral QHS  . diltiazem  60 mg Per Tube Q6H  . famotidine  20 mg Per Tube Daily  . feeding supplement (PRO-STAT SUGAR FREE 64)  30 mL Per Tube BID  . fentaNYL  150 mcg Transdermal Q72H  . free water  200 mL Per Tube Q4H  . insulin aspart  0-20 Units Subcutaneous Q4H  . insulin glargine  85 Units Subcutaneous BID  . ipratropium-albuterol  3 mL Nebulization Q6H  . mouth rinse  15  mL Mouth Rinse 10 times per day  . metoprolol tartrate  50 mg Per Tube BID  . QUEtiapine  100 mg Per Tube QHS  . sodium chloride flush  10-40 mL Intracatheter Q12H   Infusions:  . dexmedetomidine (PRECEDEX) IV infusion 0.7 mcg/kg/hr (04/26/18 1601)  . dextrose 50 mL/hr at 04/26/18 0606  . feeding supplement (VITAL HIGH PROTEIN) 1,000 mL (04/26/18 0349)  . heparin 2,000 Units/hr (04/26/18 0807)  . linezolid (ZYVOX) IV Stopped (04/26/18 1054)    Assessment: Pharmacy consulted for warfarin and heparin drip management for 47 yo male admitted to the ICU s/p cardiac arrest. Patient has history significant for aortic valve replacement, atrial fibrillation and CHF. Head CT is negative for infarction or hemorrhage.   Patient takes warfarin '5mg'$  daily as an outpatient for goal INR 2.5-3.5.    Goal of Therapy:  INR 2-3 Heparin level 0.3-0.7 units/ml Monitor platelets by anticoagulation protocol: Yes   Plan:  Heparin level in goal range. Will continue rate of 2000 units/hr and will recheck heparin level with am labs on 6/20.  Pharmacy will continue to monitor and adjust per consult.   Eldred Sooy L,  04/26/2018 4:54  PM

## 2018-04-26 NOTE — Progress Notes (Signed)
Alexander Antigua, MD [address], [place], [place], [place], [address] [address], [place], [place], [place], [address] Phone: [phone]  Fax: [phone]   Subjective: Patient resting in bed.  On trach and vent.  Opens eyes, but not responsive to questions.   Objective: Exam: Vital signs in last 24 hours: Vitals:   04/26/18 1430 04/26/18 1500 04/26/18 1526 04/26/18 1530  BP: 110/77 (!) 113/91  122/84  Pulse: 83 80  90  Resp: (!) 21 (!) 25  (!) 25  Temp:   100.3 F (37.9 C)   TempSrc:   Oral   SpO2: (!) 89% (!) 89%  90%  Weight:      Height:       Weight change: -9 lb (-4.082 kg)  Intake/Output Summary (Last 24 hours) at 04/26/2018 1558 Last data filed at 04/26/2018 1520 Gross per 24 hour  Intake 5922.26 ml  Output 3445 ml  Net 2477.26 ml    General: No acute distress Abd: Soft, NT/ND, No HSM Skin: Warm, no rashes Neck: Supple, Trachea midline   Lab Results: Lab Results  Component Value Date   WBC 13.4 (H) 04/26/2018   HGB 12.3 (L) 04/26/2018   HCT 38.5 (L) 04/26/2018   MCV 85.8 04/26/2018   PLT 203 04/26/2018   Micro Results: Recent Results (from the past 240 hour(s))  Culture, respiratory (NON-Expectorated)     Status: None   Collection Time: 04/18/18  9:37 AM  Result Value Ref Range Status   Specimen Description   Final    TRACHEAL ASPIRATE Performed at [facility], [address] [place], [place] [address]    Special Requests   Final    NONE Performed at [facility], [address] [place], [place] [address]    Gram Stain   Final    MODERATE WBC PRESENT, PREDOMINANTLY PMN FEW GRAM POSITIVE COCCI IN PAIRS IN CLUSTERS Performed at [facility], [place] [address] [place],  [address]    Culture   Final    MODERATE METHICILLIN RESISTANT STAPHYLOCOCCUS AUREUS   Report Status 04/21/2018 FINAL  Final   Organism ID, Bacteria METHICILLIN RESISTANT STAPHYLOCOCCUS AUREUS  Final      Susceptibility   Methicillin resistant staphylococcus aureus - MIC*    CIPROFLOXACIN >=8 RESISTANT Resistant     ERYTHROMYCIN >=8 RESISTANT Resistant     GENTAMICIN <=0.5 SENSITIVE Sensitive     OXACILLIN >=4 RESISTANT Resistant     TETRACYCLINE <=1 SENSITIVE Sensitive     VANCOMYCIN 1 SENSITIVE Sensitive     TRIMETH/SULFA <=10 SENSITIVE Sensitive     CLINDAMYCIN >=8 RESISTANT Resistant     RIFAMPIN <=0.5 SENSITIVE Sensitive     Inducible Clindamycin NEGATIVE Sensitive     * MODERATE METHICILLIN RESISTANT STAPHYLOCOCCUS AUREUS  C difficile quick scan w PCR reflex     Status: None   Collection Time: 04/19/18 11:21 AM  Result Value Ref Range Status   C Diff antigen NEGATIVE NEGATIVE Final   C Diff toxin NEGATIVE NEGATIVE Final   C Diff interpretation No C. difficile detected.  Final    Comment: Performed at [facility], [address] [place],  [address]  C difficile quick scan w PCR reflex     Status: None   Collection Time: 04/24/18  5:30 AM  Result Value Ref Range Status   C Diff antigen NEGATIVE NEGATIVE Final   C Diff toxin NEGATIVE NEGATIVE Final   C Diff interpretation No C. difficile  detected.  Final    Comment: Performed at [facility], [address] [place], [place] [address]   Studies/Results: Dg Abd 1 View  Result Date: 04/25/2018 CLINICAL DATA:  Check gastric catheter placement EXAM: ABDOMEN - 1 VIEW COMPARISON:  04/18/2018 FINDINGS: Gastric catheter is noted extending into the stomach. Nonobstructive bowel gas pattern is seen. No other focal abnormality is noted. IMPRESSION: Nasogastric catheter within the stomach. Electronically Signed   By: Inez Catalina M.D.   On: 04/25/2018 09:13   Dg Chest Port 1 View  Result Date: 04/25/2018 CLINICAL DATA:  Check tracheostomy placement EXAM: PORTABLE CHEST 1 VIEW COMPARISON:  04/24/2018 FINDINGS: Tracheostomy tube is noted in satisfactory position. Nasogastric catheter is noted coursing towards the stomach. The  overall inspiratory effort is poor but relatively stable from the previous exam. Patchy changes are again identified in both lungs. Right-sided PICC line is again noted and stable. IMPRESSION: Tubes and lines as described above. Patchy infiltrative changes bilaterally. Electronically Signed   By: Inez Catalina M.D.   On: 04/25/2018 09:13   Medications:  Scheduled Meds: . budesonide (PULMICORT) nebulizer solution  0.25 mg Nebulization Q6H  . chlorhexidine gluconate (MEDLINE KIT)  15 mL Mouth Rinse BID  . clonazePAM  1 mg Per Tube Daily  . clonazePAM  2 mg Oral QHS  . diltiazem  60 mg Per Tube Q6H  . famotidine  20 mg Per Tube Daily  . feeding supplement (PRO-STAT SUGAR FREE 64)  30 mL Per Tube BID  . fentaNYL  150 mcg Transdermal Q72H  . free water  200 mL Per Tube Q4H  . insulin aspart      . insulin aspart  0-20 Units Subcutaneous Q4H  . insulin glargine  85 Units Subcutaneous BID  . ipratropium-albuterol  3 mL Nebulization Q6H  . mouth rinse  15 mL Mouth Rinse 10 times per day  . metoprolol tartrate  50 mg Per Tube BID  . QUEtiapine  100 mg Per Tube QHS  . sodium chloride flush  10-40 mL Intracatheter Q12H   Continuous Infusions: . dexmedetomidine (PRECEDEX) IV infusion 0.6 mcg/kg/hr (04/26/18 1520)  . dextrose 50 mL/hr at 04/26/18 0606  . feeding supplement (VITAL HIGH PROTEIN) 1,000 mL (04/26/18 0349)  . heparin 2,000 Units/hr (04/26/18 0807)  . linezolid (ZYVOX) IV Stopped (04/26/18 1054)   PRN Meds:.acetaminophen, fentaNYL, LORazepam, metoprolol tartrate, sodium chloride flush   Assessment: 47 year old male with cardiac arrest during outpatient MRI, with history of A. fib as well as ablation, aortic valve replacement on warfarin, currently on heparin drip, with inability to wean off vent post cardiac arrest, now status post tracheostomy yesterday with GI consulted for PEG tube placement   Plan: PEG tube placement was discussed extensively with patient's wife yesterday.  See  yesterday's note  2 endoscopists needed for PEG tube placement, 1 to perform the EGD part of the procedure, and another for the incision and PEG tube placement itself. Timing of PEG tube placement, will thus need to be determined based on availability of endoscopists and endoscopy unit schedule. Elective PEG tube is not able to be done tomorrow due to schedule availability. Possible PEG tube placement on Friday or next week depending on schedule availability.  Alternatively, ICU team can consider speaking to interventional radiology staff to see if they are able to place IR guided PEG tube if one is needed earlier.  Heparin drip will need to be held for 6 hours prior to the PEG tube placement.  LOS: 19 days   Alexander Antigua, MD 04/26/2018, 3:58 PM

## 2018-04-26 NOTE — Care Management (Signed)
Per Irving BurtonEmily with [facility]- certification with insurance will start this AM.

## 2018-04-26 NOTE — Progress Notes (Signed)
Dr. Sung AmabileSimonds at bedside and RN made MD aware that left eye ball appears to deviate to the left and per wife this is new for patient.  MD acknowledged and assessed.

## 2018-04-26 NOTE — Progress Notes (Signed)
[employer] - [place] at [facility]   PATIENT NAME: Alexander HeadyOtis Alexander    MR#:  [id number]  DATE OF BIRTH:  Mar 29, 1971  SUBJECTIVE:  CHIEF COMPLAINT:   Chief Complaint  Patient presents with  . Back Pain  s/p tracheostomy. The patient is sedated with Precedex. REVIEW OF SYSTEMS:   Review of Systems  Unable to perform ROS: Medical condition    DRUG ALLERGIES:   Allergies  Allergen Reactions  . Lexapro [Escitalopram Oxalate] Swelling    VITALS:  Blood pressure 122/84, pulse 90, temperature 100.3 F (37.9 C), temperature source Oral, resp. rate (!) 25, height 5\' 10"  (1.778 m), weight (!) 411 lb (186.4 kg), SpO2 90 %.  PHYSICAL EXAMINATION:  GENERAL:  47 y.o.-year-old patient lying in the bed with no acute distress.  Morbid obesity. EYES: Pupils equal, round, reactive to light and accommodation. No scleral icterus. Extraocular muscles intact.  HEENT: Head atraumatic, normocephalic.  NECK:  Supple, no jugular venous distention. No thyroid enlargement, no tenderness.  LUNGS: Normal breath sounds bilaterally, no wheezing, rales,rhonchi or crepitation. No use of accessory muscles of respiration.  CARDIOVASCULAR: S1, S2 normal. No murmurs, rubs, or gallops.  ABDOMEN: Soft, nontender, nondistended. Bowel sounds present. No organomegaly or mass.  EXTREMITIES: No pedal edema, cyanosis, or clubbing.  NEUROLOGIC: Unable to exam. PSYCHIATRIC: The patient is sedated. SKIN: No obvious rash, lesion, or ulcer.   Physical Exam LABORATORY PANEL:   CBC Recent Labs  Lab 04/26/18 0341  WBC 13.4*  HGB 12.3*  HCT 38.5*  PLT 203   ------------------------------------------------------------------------------------------------------------------  Chemistries  Recent Labs  Lab 04/22/18 0527 04/24/18 0412  04/26/18 0341  NA 144 147*   < > 146*  K 4.4 4.6   < > 5.1  CL 108 108   < > 108  CO2 28 30   < > 30  GLUCOSE 178* 170*   < > 231*  BUN 98* 96*   < > 94*  CREATININE  3.03* 2.92*   < > 2.67*  CALCIUM 8.9 9.2   < > 9.1  MG 2.9*  --   --   --   AST  --  34  --   --   ALT  --  84*  --   --   ALKPHOS  --  64  --   --   BILITOT  --  1.0  --   --    < > = values in this interval not displayed.   ------------------------------------------------------------------------------------------------------------------  Cardiac Enzymes No results for input(s): TROPONINI in the last 168 hours. ------------------------------------------------------------------------------------------------------------------  RADIOLOGY:  Dg Abd 1 View  Result Date: 04/25/2018 CLINICAL DATA:  Check gastric catheter placement EXAM: ABDOMEN - 1 VIEW COMPARISON:  04/18/2018 FINDINGS: Gastric catheter is noted extending into the stomach. Nonobstructive bowel gas pattern is seen. No other focal abnormality is noted. IMPRESSION: Nasogastric catheter within the stomach. Electronically Signed   By: Alcide CleverMark  Lukens M.D.   On: 04/25/2018 09:13   Dg Chest Port 1 View  Result Date: 04/25/2018 CLINICAL DATA:  Check tracheostomy placement EXAM: PORTABLE CHEST 1 VIEW COMPARISON:  04/24/2018 FINDINGS: Tracheostomy tube is noted in satisfactory position. Nasogastric catheter is noted coursing towards the stomach. The overall inspiratory effort is poor but relatively stable from the previous exam. Patchy changes are again identified in both lungs. Right-sided PICC line is again noted and stable. IMPRESSION: Tubes and lines as described above. Patchy infiltrative changes bilaterally. Electronically Signed   By: Eulah PontMark  Lukens M.D.  On: 04/25/2018 09:13    ASSESSMENT AND PLAN:  47 year old male with past medical history significant for A. fib status post ablation, aortic valve replacement on warfarin, history of congestive heart failure, CKD, morbid obesity, diabetes and sleep apnea who was getting an outpatient MRI of his lower back had a cardiac arrest.  *Acute cardiopulmonary arrest From respiratory  causes Difficult weaning from vent,s/p tracheostomy. Continue ventilation.  *AcuteMRSA/VAP ContinueLinezolid,vent protocol   *Atrial fibrillationwith RVR Stable Continuemetoprolol,Cardizem, amiodarone, re-started Heparin drip.  *History of mechanical aortic valve replacement Stable Onheparin drip  *Acute toxic metabolic encephalopathy  Neurology input appreciated  Continue Kepprafor possible partial seizures  *Chronic diabetes mellitus type 2 Stable on current regiment  *acute renal failurew/ CKD stage III. Secondary toATN. Stable.  *Acute septic shock, MRSA pneumonia Resolved Plan to complete 14 days linezolid  Possible PEG tube placement this week per GI.  Need LTAC placement per Dr. Sung Amabile I discussed with Dr. Sung Amabile. All the records are reviewed and case discussed with Care Management/Social Workerr. Management plans discussed with the patient, family and they are in agreement.  CODE STATUS: full  TOTAL TIME TAKING CARE OF THIS PATIENT: 35 minutes.   POSSIBLE D/C IN ? DAYS, DEPENDING ON CLINICAL CONDITION.   Shaune Pollack M.D on 04/26/2018   Between 7am to 6pm - Pager - [phone]  After 6pm go to [email or web address] - password [email or web address]  [facility]  Office  [phone]  CC: Primary care physician; Erasmo Downer, MD  Note: This dictation was prepared with Dragon dictation along with smaller phrase technology. Any transcriptional errors that result from this process are unintentional.

## 2018-04-27 ENCOUNTER — Inpatient Hospital Stay: Payer: Medicare HMO

## 2018-04-27 DIAGNOSIS — J181 Lobar pneumonia, unspecified organism: Secondary | ICD-10-CM

## 2018-04-27 LAB — BASIC METABOLIC PANEL
ANION GAP: 8 (ref 5–15)
BUN: 86 mg/dL — ABNORMAL HIGH (ref 6–20)
CALCIUM: 8.5 mg/dL — AB (ref 8.9–10.3)
CO2: 26 mmol/L (ref 22–32)
CREATININE: 2.57 mg/dL — AB (ref 0.61–1.24)
Chloride: 109 mmol/L (ref 101–111)
GFR calc non Af Amer: 28 mL/min — ABNORMAL LOW (ref >60)
GFR, EST AFRICAN AMERICAN: 32 mL/min — AB (ref >60)
Glucose, Bld: 317 mg/dL — ABNORMAL HIGH (ref 65–99)
Potassium: 5 mmol/L (ref 3.5–5.1)
SODIUM: 143 mmol/L (ref 135–145)

## 2018-04-27 LAB — CBC
HCT: 37.7 % — ABNORMAL LOW (ref 40.0–52.0)
HEMOGLOBIN: 12.2 g/dL — AB (ref 13.0–18.0)
MCH: 27.9 pg (ref 26.0–34.0)
MCHC: 32.4 g/dL (ref 32.0–36.0)
MCV: 86.1 fL (ref 80.0–100.0)
PLATELETS: 205 10*3/uL (ref 150–440)
RBC: 4.38 MIL/uL — AB (ref 4.40–5.90)
RDW: 18.3 % — ABNORMAL HIGH (ref 11.5–14.5)
WBC: 13.3 10*3/uL — AB (ref 3.8–10.6)

## 2018-04-27 LAB — GLUCOSE, CAPILLARY
GLUCOSE-CAPILLARY: 210 mg/dL — AB (ref 65–99)
GLUCOSE-CAPILLARY: 276 mg/dL — AB (ref 65–99)
Glucose-Capillary: 237 mg/dL — ABNORMAL HIGH (ref 65–99)
Glucose-Capillary: 212 mg/dL — ABNORMAL HIGH (ref 65–99)
Glucose-Capillary: 217 mg/dL — ABNORMAL HIGH (ref 65–99)

## 2018-04-27 LAB — HEPATIC FUNCTION PANEL
ALT: 41 U/L (ref 17–63)
AST: 27 U/L (ref 15–41)
Albumin: 2.5 g/dL — ABNORMAL LOW (ref 3.5–5.0)
Alkaline Phosphatase: 68 U/L (ref 38–126)
BILIRUBIN DIRECT: 0.4 mg/dL (ref 0.1–0.5)
BILIRUBIN INDIRECT: 0.6 mg/dL (ref 0.3–0.9)
Total Bilirubin: 1 mg/dL (ref 0.3–1.2)
Total Protein: 7.4 g/dL (ref 6.5–8.1)

## 2018-04-27 LAB — HEPARIN LEVEL (UNFRACTIONATED): HEPARIN UNFRACTIONATED: 0.55 [IU]/mL (ref 0.30–0.70)

## 2018-04-27 LAB — LACTIC ACID, PLASMA: Lactic Acid, Venous: 1.6 mmol/L (ref 0.5–1.9)

## 2018-04-27 MED ORDER — ACETAZOLAMIDE SODIUM 500 MG IJ SOLR
500.0000 mg | Freq: Once | INTRAMUSCULAR | Status: AC
  Administered 2018-04-27: 500 mg via INTRAVENOUS
  Filled 2018-04-27: qty 500

## 2018-04-27 MED ORDER — STERILE WATER FOR INJECTION IJ SOLN
INTRAMUSCULAR | Status: AC
  Administered 2018-04-27: 10 mL
  Filled 2018-04-27: qty 10

## 2018-04-27 MED ORDER — POLYETHYLENE GLYCOL 3350 17 G PO PACK: 17.0000 g | PACK | Freq: Every day | ORAL | Status: DC

## 2018-04-27 MED ORDER — DILTIAZEM HCL 60 MG PO TABS
60.0000 mg | ORAL_TABLET | Freq: Four times a day (QID) | ORAL | Status: DC
Start: 2018-04-27 — End: 2018-05-19
  Administered 2018-04-27 – 2018-05-19 (×76): 60 mg via ORAL
  Filled 2018-04-27 (×74): qty 1

## 2018-04-27 NOTE — Progress Notes (Signed)
Alexander Antigua, MD [address] [place], [place], [place], [address] [address], [place], [place], [place], [address] Phone: [phone]  Fax: [phone]   Subjective: Patient remains on vent and trach.  Not on any pressors.   Objective: Exam: Vital signs in last 24 hours: Vitals:   04/27/18 0800 04/27/18 0900 04/27/18 0941 04/27/18 1000  BP: 108/81 119/80  127/84  Pulse: 88 (!) 105  (!) 113  Resp: (!) 31 (!) 30  (!) 29  Temp: (!) 102 F (38.9 C)  (!) 101 F (38.3 C)   TempSrc: Oral  Oral   SpO2: 90% 92%  (!) 88%  Weight:      Height:       Weight change: 17 lb (7.711 kg)  Intake/Output Summary (Last 24 hours) at 04/27/2018 1102 Last data filed at 04/27/2018 1000 Gross per 24 hour  Intake 5249.96 ml  Output 3525 ml  Net 1724.96 ml    General: No acute distress Abd: Soft, NT/ND, No HSM Skin: Warm, no rashes Neck: Supple, Trachea midline   Lab Results: Lab Results  Component Value Date   WBC 13.3 (H) 04/27/2018   HGB 12.2 (L) 04/27/2018   HCT 37.7 (L) 04/27/2018   MCV 86.1 04/27/2018   PLT 205 04/27/2018   Micro Results: Recent Results (from the past 240 hour(s))  Culture, respiratory (NON-Expectorated)     Status: None   Collection Time: 04/18/18  9:37 AM  Result Value Ref Range Status   Specimen Description   Final    TRACHEAL ASPIRATE Performed at [facility], [address] [place], [place] [address]    Special Requests   Final    NONE Performed at [facility], [address] [place], [place] [address]    Gram Stain   Final    MODERATE WBC PRESENT, PREDOMINANTLY PMN FEW GRAM POSITIVE COCCI IN PAIRS IN CLUSTERS Performed at [facility], [place] [address] [place], [place] [address]    Culture   Final    MODERATE METHICILLIN RESISTANT STAPHYLOCOCCUS AUREUS   Report Status 04/21/2018 FINAL  Final   Organism ID, Bacteria METHICILLIN RESISTANT STAPHYLOCOCCUS AUREUS  Final      Susceptibility   Methicillin resistant staphylococcus aureus - MIC*    CIPROFLOXACIN >=8 RESISTANT Resistant     ERYTHROMYCIN >=8 RESISTANT Resistant     GENTAMICIN <=0.5 SENSITIVE Sensitive     OXACILLIN >=4 RESISTANT Resistant     TETRACYCLINE <=1 SENSITIVE Sensitive     VANCOMYCIN 1 SENSITIVE Sensitive     TRIMETH/SULFA <=10 SENSITIVE Sensitive     CLINDAMYCIN >=8 RESISTANT Resistant     RIFAMPIN <=0.5 SENSITIVE Sensitive     Inducible Clindamycin NEGATIVE Sensitive     * MODERATE METHICILLIN RESISTANT STAPHYLOCOCCUS AUREUS  C difficile quick scan w PCR reflex     Status: None   Collection Time: 04/19/18 11:21 AM  Result Value Ref Range Status   C Diff antigen NEGATIVE NEGATIVE Final   C Diff toxin NEGATIVE NEGATIVE Final   C Diff interpretation No C. difficile detected.  Final    Comment: Performed at [facility], [address] [place],  [address]  C difficile quick scan w PCR reflex     Status: None   Collection Time: 04/24/18  5:30 AM  Result Value Ref Range Status   C Diff antigen NEGATIVE NEGATIVE Final   C Diff toxin NEGATIVE NEGATIVE Final   C Diff interpretation No C. difficile detected.  Final    Comment: Performed at [facility], [address] [place], [place] [address]   Studies/Results: Dg Chest Port 1 View  Result Date: 04/27/2018 CLINICAL DATA:  Respiratory failure EXAM: PORTABLE CHEST 1 VIEW COMPARISON:  Two days ago FINDINGS: Tracheostomy tube in good position. An orogastric tube reaches the stomach. There is artifact from EKG leads. Right upper extremity PICC with tip at the upper cavoatrial junction. Very low lung volumes with diffuse hazy chest opacity. Asymmetric parenchymal opacity on the left. Cardiac enlargement. Prior median sternotomy. No evidence of pneumothorax. IMPRESSION: 1. Stable hardware positioning. 2. Low volume chest with bilateral atelectasis or pneumonia and possible layering effusions. No convincing change from 2 days ago.  Electronically Signed   By: Monte Fantasia M.D.   On: 04/27/2018 07:36   Medications:  Scheduled Meds: . budesonide (PULMICORT) nebulizer solution  0.25 mg Nebulization Q6H  . chlorhexidine gluconate (MEDLINE KIT)  15 mL Mouth Rinse BID  . clonazePAM  1 mg Per Tube Daily  . clonazePAM  2 mg Oral QHS  . diltiazem  60 mg Per Tube Q6H  . famotidine  20 mg Per Tube Daily  . feeding supplement (PRO-STAT SUGAR FREE 64)  30 mL Per Tube BID  . fentaNYL  150 mcg Transdermal Q72H  . free water  200 mL Per Tube Q4H  . insulin aspart  0-20 Units Subcutaneous Q4H  . insulin glargine  85 Units Subcutaneous BID  . ipratropium-albuterol  3 mL Nebulization Q6H  . mouth rinse  15 mL Mouth Rinse 10 times per day  . metoprolol tartrate  50 mg Per Tube BID  . QUEtiapine  100 mg Per Tube QHS  . sodium chloride flush  10-40 mL Intracatheter Q12H   Continuous Infusions: . dexmedetomidine (PRECEDEX) IV infusion 0.8 mcg/kg/hr (04/27/18 0953)  . dextrose 50 mL/hr at 04/27/18 0619  . feeding supplement (VITAL HIGH PROTEIN) 1,000 mL (04/27/18 0836)  . heparin 2,000 Units/hr (04/27/18 8416)  . linezolid (ZYVOX) IV 600 mg (04/27/18 0949)   PRN Meds:.acetaminophen, fentaNYL, metoprolol tartrate, sodium chloride flush   Assessment: GI consulted for PEG tube placement and the patient admitted after cardiac arrest during an outpatient MRI, status post trach and on vent at this time   Plan: Feeding tube placement is indicated given that patient is unable to feed himself and maintain nutrition We will plan on PEG tube placement potentially tomorrow as long as schedule allows PEG tube orders placed Heparin drip to be held 6 hours prior to PEG tube placement.  I have placed nursing order to hold heparin drip starting at 5 AM tomorrow for, if  okay with intensivist    LOS: 20 days   Alexander Antigua, MD 04/27/2018, 11:02 AM

## 2018-04-27 NOTE — Progress Notes (Signed)
Inpatient Diabetes Program Recommendations  AACE/ADA: New Consensus Statement on Inpatient Glycemic Control (2015)  Target Ranges:  Prepandial:   less than 140 mg/dL      Peak postprandial:   less than 180 mg/dL (1-2 hours)      Critically ill patients:  140 - 180 mg/dL   Results for Alexander Alexander, Alexander Alexander (MRN [id number]) as of 04/27/2018 08:38  Ref. Range 04/25/2018 23:15 04/26/2018 03:21 04/26/2018 07:36 04/26/2018 11:05 04/26/2018 15:40 04/26/2018 19:39  Glucose-Capillary Latest Ref Range: 65 - 99 mg/dL 409195 (H) 811219 (H) 914236 (H) 201 (H) 228 (H) 212 (H)   Results for Alexander Alexander, Alexander Alexander (MRN [id number]) as of 04/27/2018 08:38  Ref. Range 04/26/2018 23:44 04/27/2018 03:21 04/27/2018 07:33  Glucose-Capillary Latest Ref Range: 65 - 99 mg/dL 086216 (H) 578210 (H) 469276 (H)    HomeDM Meds: Levemir 65unitsBID  Novolog 40 unitsTID  Amaryl 2 mg daily  Current Orders: Lantus 85 units BID                            Novolog Resistant Correction Scale/ SSI (0-20 units) Q4 hours     Patient currently receiving Vital High Protein tube feeds at 80cc/hour.  CBGs elevated >200 mg/dl consistently.      MD- Please consider adding Novolog Tube Feed coverage to current Insulin regimen:  Novolog 4 units Q4 hours (Hold if tube feeds held for any reason)     --Will follow patient during hospitalization--  Ambrose FinlandJeannine Johnston Caeley Dohrmann RN, MSN, CDE Diabetes Coordinator Inpatient Glycemic Control Team Team Pager: [phone] (8a-5p)

## 2018-04-27 NOTE — Care Management (Signed)
LTAC auth/certification still pending.

## 2018-04-27 NOTE — Progress Notes (Signed)
[place] for heparin drip management Indication: atrial fibrillation and aortic valve replacement   Allergies  Allergen Reactions  . Lexapro [Escitalopram Oxalate] Swelling    Patient Measurements: Height: 5' 10" (177.8 cm) Weight: (!) 411 lb (186.4 kg) IBW/kg (Calculated) : 73 Heparin Dosing Weight: 111kg  Vital Signs: Temp: 100.9 F (38.3 C) (06/19 2000) Temp Source: Oral (06/19 2000) BP: 120/86 (06/20 0100) Pulse Rate: 100 (06/20 0100)  Labs: Recent Labs    04/25/18 0358  04/26/18 0341 04/26/18 0643 04/26/18 1305 04/27/18 0509  HGB 10.3*  --  12.3*  --   --  12.2*  HCT 33.5*  --  38.5*  --   --  37.7*  PLT 174  --  203  --   --  205  HEPARINUNFRC  --    < >  --  0.45 0.54 0.55  CREATININE 2.43*  --  2.67*  --   --  2.57*   < > = values in this interval not displayed.    Estimated Creatinine Clearance: 58.9 mL/min (A) (by C-G formula based on SCr of 2.57 mg/dL (H)).   Medical History: Past Medical History:  Diagnosis Date  . Allergy   . Anxiety   . Arrhythmia   . Arthritis   . Atrial fibrillation ([place])   . CHF (congestive heart failure) ([place])   . CKD (chronic kidney disease)   . Diabetes mellitus without complication ([place])   . Diabetes mellitus, type II ([place])   . Heart failure ([place])   . Hyperlipidemia   . Hypertension   . Pancreatitis   . Sleep apnea     Medications:  Scheduled:  . budesonide (PULMICORT) nebulizer solution  0.25 mg Nebulization Q6H  . chlorhexidine gluconate (MEDLINE KIT)  15 mL Mouth Rinse BID  . clonazePAM  1 mg Per Tube Daily  . clonazePAM  2 mg Oral QHS  . diltiazem  60 mg Per Tube Q6H  . famotidine  20 mg Per Tube Daily  . feeding supplement (PRO-STAT SUGAR FREE 64)  30 mL Per Tube BID  . fentaNYL  150 mcg Transdermal Q72H  . free water  200 mL Per Tube Q4H  . insulin aspart  0-20 Units Subcutaneous Q4H  . insulin glargine  85 Units Subcutaneous BID  . ipratropium-albuterol  3 mL  Nebulization Q6H  . mouth rinse  15 mL Mouth Rinse 10 times per day  . metoprolol tartrate  50 mg Per Tube BID  . QUEtiapine  100 mg Per Tube QHS  . sodium chloride flush  10-40 mL Intracatheter Q12H   Infusions:  . dexmedetomidine (PRECEDEX) IV infusion 0.8 mcg/kg/hr (04/27/18 0142)  . dextrose 50 mL/hr at 04/26/18 0606  . feeding supplement (VITAL HIGH PROTEIN) 1,000 mL (04/26/18 1810)  . heparin 2,000 Units/hr (04/26/18 2219)  . linezolid (ZYVOX) IV Stopped (04/27/18 0012)    Assessment: Pharmacy consulted for warfarin and heparin drip management for 47 yo male admitted to the ICU s/p cardiac arrest. Patient has history significant for aortic valve replacement, atrial fibrillation and CHF. Head CT is negative for infarction or hemorrhage.   Patient takes warfarin 53m daily as an outpatient for goal INR 2.5-3.5.    Goal of Therapy:  INR 2-3 Heparin level 0.3-0.7 units/ml Monitor platelets by anticoagulation protocol: Yes   Plan:  06/20 @ 0500 HL 0.55 therapeutic. Will continue at 2000 units/hr and will recheck level w/ am labs. CBC stable.  DTobie Lords PharmD, BCPS  Clinical Pharmacist 04/27/2018

## 2018-04-27 NOTE — Progress Notes (Signed)
PULMONARY / CRITICAL CARE MEDICINE   Name: Alexander Alexander MRN: [id number] DOB: 1971/06/09    ADMISSION DATE:  04/07/2018  PT PROFILE:   17M with PMH of atrial fibrillation, status post ablation and aortic valve replacement on long term warfarin, CHF, CKD, diabetes, obstructive sleep apnea and hypertension, suffered a pulseless VT cardiac arrest in MRI when he was being evaluated for back pain and sciatica, subsequently intubated in the intensive care unit. Difficult airway noted  EVENTS/RESULTS: 06/11 CT chest: Dense RLL consolidation.  Segmental RML and LLL atelectasis.  Patchy GGO in upper lobes bilaterally 06/12 rough night with desaturation.  Transient bradycardia.  06/13 More responsive.  Gas exchange improving.  No further bradycardia.  Amiodarone has been stopped. AFRVR (up to 150/min).  Follows commands.  Remains on fentanyl infusion.  Propofol discontinued. 06/17 Gas exchange improving. Rate adequately controlled. Cognition unchanged. Remains on fentanyl infusion. Gastroenterology consultation requested for G tube placement 06/18 Trach tube placement. Post op hypoxemia. Vent changes made 06/19 Transition off of continuous fentanyl. Dexmedetomidine infusion initiated  INDWELLING DEVICES:: R IJ CVL 05/31 >> 06/10 ETT 05/31 >> 06/18 RUE PICC 06/10 >>  Trach tube 06/18 >>    MICRO DATA: MRSA PCR 05/31 >> NEG Urine 06/01 >> NEG Resp 06/01 >> MRSA Blood 06/01 >> NEG C diff 06/04 >> NEG  Urine 06/05 >> NEG Resp 06/05 >> MRSA Blood 06/05 >> NEG Resp 06/11 >> MRSA C diff 6/17 >> NEG  ANTIMICROBIALS:  Pip-tazo 05/31 >> 06/03 Vanc 05/31 >> 6/12 Cefepime 06/11 >> 06/15 Linezolid 06/12 >>   SUBJECTIVE:  Intermittently agitated.  Intermittently follows commands.  VITAL SIGNS: BP 108/81   Pulse 88   Temp (!) 102 F (38.9 C) (Oral)   Resp (!) 31   Ht 5\' 10"  (1.778 m)   Wt (!) 428 lb (194.1 kg)   SpO2 90%   BMI 61.41 kg/m   HEMODYNAMICS:    VENTILATOR  SETTINGS: Vent Mode: PCV FiO2 (%):  [50 %-60 %] 60 % Set Rate:  [16 bmp] 16 bmp PEEP:  [10 cmH20] 10 cmH20 Plateau Pressure:  [26 cmH20] 26 cmH20  INTAKE / OUTPUT: I/O last 3 completed shifts: In: 8189.5 [I.V.:3599.5; NG/GT:4290; IV Piggyback:300] Out: 4695 [Urine:4695]  PHYSICAL EXAMINATION: General: Intermittently agitated, + F/C Neuro: CNs intact, moves all extremities, eye opening but no commands HEENT: NCAT, sclerae white Cardiovascular: IRIR, mildly tachy, no M Lungs: Few rhonchi, no wheezes , trach site clear of secretions Abdomen: NT, ND, NABS, soft, diarrhoea Ext: No LE edema  LABS:  BMET Recent Labs  Lab 04/25/18 0358 04/26/18 0341 04/27/18 0509  NA 147* 146* 143  K 5.1 5.1 5.0  CL 110 108 109  CO2 31 30 26   BUN 83* 94* 86*  CREATININE 2.43* 2.67* 2.57*  GLUCOSE 202* 231* 317*    Electrolytes Recent Labs  Lab 04/22/18 0527  04/25/18 0358 04/26/18 0341 04/27/18 0509  CALCIUM 8.9   < > 9.0 9.1 8.5*  MG 2.9*  --   --   --   --   PHOS 3.9  --   --   --   --    < > = values in this interval not displayed.    CBC Recent Labs  Lab 04/25/18 0358 04/26/18 0341 04/27/18 0509  WBC 10.3 13.4* 13.3*  HGB 10.3* 12.3* 12.2*  HCT 33.5* 38.5* 37.7*  PLT 174 203 205    Coag's No results for input(s): APTT, INR in the last 168 hours.  Sepsis Markers Recent Labs  Lab 04/21/18 0440 04/22/18 0527  PROCALCITON 29.96 21.80    ABG Recent Labs  Lab 04/22/18 1115 04/23/18 0429 04/24/18 0412  PHART 7.25* 7.22* 7.27*  PCO2ART 77* 84* 77*  PO2ART 69* 58* 64*    Liver Enzymes Recent Labs  Lab 04/21/18 0440 04/24/18 0412  AST 135* 34  ALT 166* 84*  ALKPHOS 69 64  BILITOT 1.7* 1.0  ALBUMIN 2.5* 2.6*    Cardiac Enzymes No results for input(s): TROPONINI, PROBNP in the last 168 hours.  Glucose Recent Labs  Lab 04/26/18 1105 04/26/18 1540 04/26/18 1939 04/26/18 2344 04/27/18 0321 04/27/18 0733  GLUCAP 201* 228* 212* 216* 210* 276*     CXR: No new film   ASSESSMENT / PLAN:  PULMONARY A: Respiratory arrest 05/31 Prolonged ventilator dependence / Acute Respiratory failure from Hypoxia Tracheostomy status Persistently high FiO2/PEEP requirements RLL consolidation/PNA P:   Cont vent support -tolerates PCV mode better than PRVC Cont vent bundle Daily SBT if/when meets criteria Continue nebulized bronchodilators Add nebulized steroids 06/19  CARDIOVASCULAR A:  VT cardiac arrest 05/31 (deemed secondary to respiratory rest) Chronic atrial fibrillation with RVR - rate control improved S/P mechanical aortic valve replacement P:  MAP goal > 65 mmHg Continue heparin infusion until after G-tube placement Transition to warfarin after all procedures completed Continue enteral metoprolol initiated 6/11 Continue enteral diltiazem, initiated 6/17, dose increased 6/18  RENAL A:   CKD, baseline creatinine 2.0-2.5 Nonoliguric AKI Mild hypervolemia, resolved Mild hypernatremia resolved P:   Monitor BMET intermittently Monitor I/Os Correct electrolytes as indicated  Free water increased 6/18  GASTROINTESTINAL A:   Abdominal distention, resolved P:   SUP: Enteral famotidine Continue TF protocol Peg tube placement for possible Friday pending GI availability   HEMATOLOGIC A:   Very mild ICU acquired anemia P:  DVT px: heparin infusion (AF, mechanical AVR) Monitor CBC intermittently Transfuse per usual guidelines  Minimize blood draws  INFECTIOUS A:   MRSA pneumonia Severe sepsis, resolved P:   Monitor temp, WBC count Micro and abx as above  Plan to complete 14 days linezolid (through 6/25)  ENDOCRINE A:   DM 2 with severe insulin resistance/ Hyperglycemia P:   Continue Lantus  Continue SSI, resistance scale  NEUROLOGIC A:   Acute encephalopathy ICU/vent associated discomfort P:   RASS goal: 0, -1 Continue Duragesic patch Continue clonazepam initiated 6/18 Fentanyl infusion  discontinued 6/19 Will use dexmedetomidine to transition off of fentanyl 6/19   FAMILY: Wife updated at bedside  CCM time:  35 mins The above time includes time spent in consultation with patient and/or family members and reviewing care plan on multidisciplinary rounds  Jackson LatinoKarol Rainier Feuerborn, MD PCCM service Pager [phone] 04/27/2018 9:13 AM

## 2018-04-27 NOTE — Progress Notes (Signed)
[facility] - [place] at [facility]   PATIENT NAME: Alexander Alexander    MR#:  [id number]  DATE OF BIRTH:  07-May-1971  SUBJECTIVE:  CHIEF COMPLAINT:   Chief Complaint  Patient presents with  . Back Pain  s/p tracheostomy. The patient is sedated with Precedex. On vent via trach REVIEW OF SYSTEMS:   Review of Systems  Unable to perform ROS: Medical condition    DRUG ALLERGIES:   Allergies  Allergen Reactions  . Lexapro [Escitalopram Oxalate] Swelling    VITALS:  Blood pressure 102/81, pulse (!) 123, temperature 99.9 F (37.7 C), temperature source Oral, resp. rate (!) 24, height 5\' 10"  (1.778 m), weight (!) 428 lb (194.1 kg), SpO2 90 %.  PHYSICAL EXAMINATION:  GENERAL:  47 y.o.-year-old patient lying in the bed with no acute distress.  Morbid obesity. EYES: Pupils equal, round, reactive to light and accommodation. No scleral icterus. Extraocular muscles intact.  HEENT: Head atraumatic, normocephalic.  NECK:  Supple, no jugular venous distention. No thyroid enlargement, no tenderness. On trach LUNGS: Normal breath sounds bilaterally, no wheezing, rales,rhonchi or crepitation. No use of accessory muscles of respiration.  CARDIOVASCULAR: S1, S2 normal. No murmurs, rubs, or gallops.  ABDOMEN: Soft, nontender, nondistended. Bowel sounds present. No organomegaly or mass.  EXTREMITIES: b/l leg edema, no cyanosis, or clubbing.  NEUROLOGIC: Unable to exam. PSYCHIATRIC: The patient is sedated. SKIN: No obvious rash, lesion, or ulcer.   Physical Exam LABORATORY PANEL:   CBC Recent Labs  Lab 04/27/18 0509  WBC 13.3*  HGB 12.2*  HCT 37.7*  PLT 205   ------------------------------------------------------------------------------------------------------------------  Chemistries  Recent Labs  Lab 04/22/18 0527  04/27/18 0509 04/27/18 1049  NA 144   < > 143  --   K 4.4   < > 5.0  --   CL 108   < > 109  --   CO2 28   < > 26  --   GLUCOSE 178*   < > 317*  --    BUN 98*   < > 86*  --   CREATININE 3.03*   < > 2.57*  --   CALCIUM 8.9   < > 8.5*  --   MG 2.9*  --   --   --   AST  --    < >  --  27  ALT  --    < >  --  41  ALKPHOS  --    < >  --  68  BILITOT  --    < >  --  1.0   < > = values in this interval not displayed.   ------------------------------------------------------------------------------------------------------------------  Cardiac Enzymes No results for input(s): TROPONINI in the last 168 hours. ------------------------------------------------------------------------------------------------------------------  RADIOLOGY:  Dg Chest Port 1 View  Result Date: 04/27/2018 CLINICAL DATA:  Respiratory failure EXAM: PORTABLE CHEST 1 VIEW COMPARISON:  Two days ago FINDINGS: Tracheostomy tube in good position. An orogastric tube reaches the stomach. There is artifact from EKG leads. Right upper extremity PICC with tip at the upper cavoatrial junction. Very low lung volumes with diffuse hazy chest opacity. Asymmetric parenchymal opacity on the left. Cardiac enlargement. Prior median sternotomy. No evidence of pneumothorax. IMPRESSION: 1. Stable hardware positioning. 2. Low volume chest with bilateral atelectasis or pneumonia and possible layering effusions. No convincing change from 2 days ago. Electronically Signed   By: Marnee Spring M.D.   On: 04/27/2018 07:36    ASSESSMENT AND PLAN:  47 year old male with past  medical history significant for A. fib status post ablation, aortic valve replacement on warfarin, history of congestive heart failure, CKD, morbid obesity, diabetes and sleep apnea who was getting an outpatient MRI of his lower back had a cardiac arrest.  *Acute cardiopulmonary arrest From respiratory causes Difficult weaning from vent,s/p tracheostomy. Continue ventilation.  *AcuteMRSA/VAP ContinueLinezolid,vent protocol   *Atrial fibrillationwith RVR Stable Continuemetoprolol,Cardizem, amiodarone, continue  Heparin drip.  *History of mechanical aortic valve replacement Stable Onheparin drip  *Acute toxic metabolic encephalopathy  Neurology input appreciated  Continue Kepprafor possible partial seizures  *Chronic diabetes mellitus type 2 Stable on current regiment  *acute renal failurew/ CKD stage III. Secondary toATN. Stable.  *Acute septic shock, MRSA pneumonia Resolved Plan to complete 14 days linezolid  PEG tube placement tomorrow per GI.  Need LTAC placement per Dr. Sung AmabileSimonds. I discussed with Dr Gerlene Burdockichard. All the records are reviewed and case discussed with Care Management/Social Workerr. Management plans discussed with the patient, family and they are in agreement.  CODE STATUS: full  TOTAL TIME TAKING CARE OF THIS PATIENT: 26 minutes.   POSSIBLE D/C IN ? DAYS, DEPENDING ON CLINICAL CONDITION.   Shaune PollackQing Dani Alexander M.D on 04/27/2018   Between 7am to 6pm - Pager - [phone]  After 6pm go to [email or web address] - password [email or web address]  [facility]  Office  [phone]  CC: Primary care physician; Erasmo DownerBacigalupo, Alexander M, MD  Note: This dictation was prepared with Dragon dictation along with smaller phrase technology. Any transcriptional errors that result from this process are unintentional.

## 2018-04-28 ENCOUNTER — Inpatient Hospital Stay: Payer: Medicare HMO

## 2018-04-28 ENCOUNTER — Encounter
Admission: EM | Disposition: A | Discharge status: Home Health | Payer: Self-pay | Source: Home / Self Care | Attending: Family Medicine

## 2018-04-28 ENCOUNTER — Encounter: Payer: Self-pay | Admitting: Gastroenterology

## 2018-04-28 ENCOUNTER — Encounter: Payer: Self-pay | Admitting: Anesthesiology

## 2018-04-28 DIAGNOSIS — I952 Hypotension due to drugs: Secondary | ICD-10-CM

## 2018-04-28 DIAGNOSIS — I82492 Acute embolism and thrombosis of other specified deep vein of left lower extremity: Secondary | ICD-10-CM

## 2018-04-28 DIAGNOSIS — R1310 Dysphagia, unspecified: Secondary | ICD-10-CM

## 2018-04-28 HISTORY — PX: PEG PLACEMENT: SHX5437

## 2018-04-28 LAB — CBC
HEMATOCRIT: 35 % — AB (ref 40.0–52.0)
Hemoglobin: 11.7 g/dL — ABNORMAL LOW (ref 13.0–18.0)
MCH: 28.4 pg (ref 26.0–34.0)
MCHC: 33.5 g/dL (ref 32.0–36.0)
MCV: 84.6 fL (ref 80.0–100.0)
Platelets: 202 10*3/uL (ref 150–440)
RBC: 4.13 MIL/uL — ABNORMAL LOW (ref 4.40–5.90)
RDW: 18.5 % — ABNORMAL HIGH (ref 11.5–14.5)
WBC: 12.2 10*3/uL — AB (ref 3.8–10.6)

## 2018-04-28 LAB — BLOOD GAS, ARTERIAL
ACID-BASE EXCESS: 3.9 mmol/L — AB (ref 0.0–2.0)
Acid-base deficit: 0.2 mmol/L (ref 0.0–2.0)
BICARBONATE: 26.4 mmol/L (ref 20.0–28.0)
Bicarbonate: 29.2 mmol/L — ABNORMAL HIGH (ref 20.0–28.0)
FIO2: 0.6
FIO2: 0.6
Mechanical Rate: 16
O2 SAT: 95.2 %
O2 SAT: 94.4 %
PATIENT TEMPERATURE: 37
PEEP/CPAP: 10 cmH2O
PEEP/CPAP: 10 cmH2O
PRESSURE CONTROL: 20 cmH2O
Patient temperature: 37
Pressure control: 20 cmH2O
RATE: 16 resp/min
pCO2 arterial: 46 mmHg (ref 32.0–48.0)
pCO2 arterial: 49 mmHg — ABNORMAL HIGH (ref 32.0–48.0)
pH, Arterial: 7.41 (ref 7.350–7.450)
pH, Arterial: 7.34 — ABNORMAL LOW (ref 7.350–7.450)
pO2, Arterial: 76 mmHg — ABNORMAL LOW (ref 83.0–108.0)
pO2, Arterial: 77 mmHg — ABNORMAL LOW (ref 83.0–108.0)

## 2018-04-28 LAB — BASIC METABOLIC PANEL
ANION GAP: 9 (ref 5–15)
BUN: 78 mg/dL — ABNORMAL HIGH (ref 6–20)
CO2: 26 mmol/L (ref 22–32)
Calcium: 8.4 mg/dL — ABNORMAL LOW (ref 8.9–10.3)
Chloride: 109 mmol/L (ref 101–111)
Creatinine, Ser: 2.8 mg/dL — ABNORMAL HIGH (ref 0.61–1.24)
GFR calc Af Amer: 29 mL/min — ABNORMAL LOW (ref >60)
GFR, EST NON AFRICAN AMERICAN: 25 mL/min — AB (ref >60)
GLUCOSE: 109 mg/dL — AB (ref 65–99)
POTASSIUM: 4.9 mmol/L (ref 3.5–5.1)
Sodium: 144 mmol/L (ref 135–145)

## 2018-04-28 LAB — RENAL FUNCTION PANEL
ANION GAP: 8 (ref 5–15)
Albumin: 2.6 g/dL — ABNORMAL LOW (ref 3.5–5.0)
BUN: 82 mg/dL — ABNORMAL HIGH (ref 6–20)
CO2: 27 mmol/L (ref 22–32)
CREATININE: 2.5 mg/dL — AB (ref 0.61–1.24)
Calcium: 8.4 mg/dL — ABNORMAL LOW (ref 8.9–10.3)
Chloride: 110 mmol/L (ref 101–111)
GFR calc non Af Amer: 29 mL/min — ABNORMAL LOW (ref >60)
GFR, EST AFRICAN AMERICAN: 33 mL/min — AB (ref >60)
Glucose, Bld: 170 mg/dL — ABNORMAL HIGH (ref 65–99)
PHOSPHORUS: 4 mg/dL (ref 2.5–4.6)
Potassium: 4.9 mmol/L (ref 3.5–5.1)
SODIUM: 145 mmol/L (ref 135–145)

## 2018-04-28 LAB — HEPARIN LEVEL (UNFRACTIONATED): HEPARIN UNFRACTIONATED: 0.36 [IU]/mL (ref 0.30–0.70)

## 2018-04-28 LAB — TROPONIN I
Troponin I: 0.11 ng/mL (ref <0.03)
Troponin I: 0.11 ng/mL (ref <0.03)

## 2018-04-28 LAB — GLUCOSE, CAPILLARY
GLUCOSE-CAPILLARY: 105 mg/dL — AB (ref 65–99)
GLUCOSE-CAPILLARY: 159 mg/dL — AB (ref 65–99)
GLUCOSE-CAPILLARY: 182 mg/dL — AB (ref 65–99)
GLUCOSE-CAPILLARY: 78 mg/dL (ref 65–99)
Glucose-Capillary: 160 mg/dL — ABNORMAL HIGH (ref 65–99)
Glucose-Capillary: 137 mg/dL — ABNORMAL HIGH (ref 65–99)

## 2018-04-28 SURGERY — INSERTION, PEG TUBE
Anesthesia: General | Laterality: Left

## 2018-04-28 MED ORDER — PROPOFOL 1000 MG/100ML IV EMUL
5.0000 ug/kg/min | INTRAVENOUS | Status: DC
  Administered 2018-04-28: 40 ug/kg/min via INTRAVENOUS
  Filled 2018-04-28: qty 100

## 2018-04-28 MED ORDER — ATROPINE SULFATE 1 MG/10ML IJ SOSY
1.0000 mg | PREFILLED_SYRINGE | Freq: Once | INTRAMUSCULAR | Status: AC
  Administered 2018-04-28: 1 mg via INTRAVENOUS

## 2018-04-28 MED ORDER — DEXTROSE 5 % IV SOLN
3.0000 g | Freq: Once | INTRAVENOUS | Status: AC
  Administered 2018-04-28: 3 g via INTRAVENOUS
  Filled 2018-04-28: qty 3000

## 2018-04-28 MED ORDER — EPINEPHRINE PF 1 MG/10ML IJ SOSY
1.0000 mg | PREFILLED_SYRINGE | Freq: Once | INTRAMUSCULAR | Status: AC
  Administered 2018-04-28: 1 mg via INTRAVENOUS

## 2018-04-28 MED ORDER — DOPAMINE-DEXTROSE 3.2-5 MG/ML-% IV SOLN: 0.0000 ug/kg/min | INTRAVENOUS | Status: DC

## 2018-04-28 MED ORDER — HEPARIN (PORCINE) IN NACL 100-0.45 UNIT/ML-% IJ SOLN
2150.0000 [IU]/h | INTRAMUSCULAR | Status: DC
Start: 2018-04-28 — End: 2018-05-02
  Administered 2018-04-28 (×2): 2000 [IU]/h via INTRAVENOUS
  Administered 2018-04-29 – 2018-05-02 (×4): 2150 [IU]/h via INTRAVENOUS
  Filled 2018-04-28 (×7): qty 250

## 2018-04-28 MED ORDER — SODIUM CHLORIDE 0.9 % IV SOLN: INTRAVENOUS | Status: DC

## 2018-04-28 NOTE — Progress Notes (Signed)
[place] for heparin drip management Indication: atrial fibrillation and aortic valve replacement   Allergies  Allergen Reactions  . Lexapro [Escitalopram Oxalate] Swelling    Patient Measurements: Height: 5' 10"  (177.8 cm) Weight: (!) 425 lb (192.8 kg) IBW/kg (Calculated) : 73 Heparin Dosing Weight: 121 kg (updated 6/21)  Vital Signs: Temp: 100 F (37.8 C) (06/21 1800) Temp Source: Oral (06/21 1800) BP: 154/92 (06/21 1900) Pulse Rate: 105 (06/21 1900)  Labs: Recent Labs    04/26/18 0341  04/26/18 1305 04/27/18 0509 04/28/18 0611 04/28/18 1505 04/28/18 1754  HGB 12.3*  --   --  12.2* 11.7*  --   --   HCT 38.5*  --   --  37.7* 35.0*  --   --   PLT 203  --   --  205 202  --   --   HEPARINUNFRC  --    < > 0.54 0.55  --   --  0.36  CREATININE 2.67*  --   --  2.57* 2.50* 2.80*  --   TROPONINI  --   --   --   --   --  0.11*  --    < > = values in this interval not displayed.    Estimated Creatinine Clearance: 55.2 mL/min (A) (by C-G formula based on SCr of 2.8 mg/dL (H)).   Medical History: Past Medical History:  Diagnosis Date  . Allergy   . Anxiety   . Arrhythmia   . Arthritis   . Atrial fibrillation ([place])   . CHF (congestive heart failure) ([place])   . CKD (chronic kidney disease)   . Diabetes mellitus without complication ([place])   . Diabetes mellitus, type II ([place])   . Heart failure ([place])   . Hyperlipidemia   . Hypertension   . Pancreatitis   . Sleep apnea     Medications:  Scheduled:  . budesonide (PULMICORT) nebulizer solution  0.25 mg Nebulization Q6H  . chlorhexidine gluconate (MEDLINE KIT)  15 mL Mouth Rinse BID  . clonazePAM  1 mg Per Tube Daily  . clonazePAM  2 mg Oral QHS  . diltiazem  60 mg Oral Q6H  . famotidine  20 mg Per Tube Daily  . feeding supplement (PRO-STAT SUGAR FREE 64)  30 mL Per Tube BID  . fentaNYL  150 mcg Transdermal Q72H  . free water  200 mL Per Tube Q4H  . insulin aspart  0-20 Units  Subcutaneous Q4H  . insulin glargine  85 Units Subcutaneous BID  . ipratropium-albuterol  3 mL Nebulization Q6H  . mouth rinse  15 mL Mouth Rinse 10 times per day  . metoprolol tartrate  50 mg Per Tube BID  . QUEtiapine  100 mg Per Tube QHS  . sodium chloride flush  10-40 mL Intracatheter Q12H   Infusions:  . dextrose 50 mL/hr at 04/28/18 1900  . DOPamine    . feeding supplement (VITAL HIGH PROTEIN) Stopped (04/28/18 0000)  . heparin 2,000 Units/hr (04/28/18 1900)    Assessment: Pharmacy consulted for warfarin and heparin drip management for 47 yo male admitted to the ICU s/p cardiac arrest. Patient has history significant for aortic valve replacement, atrial fibrillation and CHF. Head CT is negative for infarction or hemorrhage.   Patient takes warfarin 42m daily as an outpatient for goal INR 2.5-3.5.    Goal of Therapy:  INR 2-3 Heparin level 0.3-0.7 units/ml Monitor platelets by anticoagulation protocol: Yes   Plan:  Heparin  level therapeutic at 0.36. Patient has been therapeutic on this rate, heparin was only held for PEG tube attempt. Check another HL at 0500.   Pharmacy will continue to monitor and adjust per consult.   Ramond Dial, Pharm.D, BCPS Clinical Pharmacist   04/28/2018 7:33 PM

## 2018-04-28 NOTE — Progress Notes (Signed)
Cardiac Event: As the endoscopy team was clearing their equipment from the room, post procedure, the patient became bradycardic with a rate as low as 26, and hypotensive w/ SBP in the 60's, SpO2 as low as 82% on FiO2 of 60%. Propofol that was used for sedation for the procedure was already off at this point. Patient placed in supine position, Copywriter, advertisingntensivist and Charge RN notified. Both immediately came to the bedside. Crash cart was brought to room, pads placed on the patient. Verbal order to give 1mg  of Atropine received, as well as a 500cc bolus of 0.9% NS. Precedex was infusing at 0.8 mcg/kg/hr; same was dc'd at that time. No effect w/ Atropine. Verbal order for 1mg  of Epi was given; same was administered, w/ immediate increase in HR to 180's. BP recovered w/ fluid bolus. Patient remains attached Zoll defibrillator.

## 2018-04-28 NOTE — Progress Notes (Signed)
Lab called back w/ a Troponin of 0.11. Dr Peggye Pittichards was notified of results face-to-face.

## 2018-04-28 NOTE — H&P (Signed)
Melodie BouillonVarnita Santanna Olenik, MD [address], [address], [place], [place], [address] [address], [address], [place], [place], [address] Phone: [phone]  Fax: [phone]  Primary Care Physician:  Erasmo DownerBacigalupo, Angela M, MD   Pre-Procedure History & Physical: HPI:  Alexander RiffleOtis Leon Alexander is a 47 y.o. male is here for an EGD.   Past Medical History:  Diagnosis Date  . Allergy   . Anxiety   . Arrhythmia   . Arthritis   . Atrial fibrillation (HCC)   . CHF (congestive heart failure) (HCC)   . CKD (chronic kidney disease)   . Diabetes mellitus without complication (HCC)   . Diabetes mellitus, type II (HCC)   . Heart failure (HCC)   . Hyperlipidemia   . Hypertension   . Pancreatitis   . Sleep apnea     Past Surgical History:  Procedure Laterality Date  . ABLATION  08/01/2017  . MECHANICAL AORTIC VALVE REPLACEMENT    . TRACHEOSTOMY TUBE PLACEMENT N/A 04/25/2018   Procedure: TRACHEOSTOMY;  Surgeon: Vernie MurdersJuengel, Paul, MD;  Location: [facility] ORS;  Service: ENT;  Laterality: N/A;    Prior to Admission medications   Medication Sig Start Date End Date Taking? Authorizing Provider  allopurinol (ZYLOPRIM) 100 MG tablet TAKE 0.5 TABLETS (50 MG TOTAL) BY MOUTH DAILY. 03/10/18  Yes Bacigalupo, [name], MD  ALPRAZolam Prudy Feeler(XANAX) 0.5 MG tablet Take 1 tablet (0.5 mg total) by mouth 2 (two) times daily. 01/16/18  Yes Bacigalupo, [name], MD  colchicine 0.6 MG tablet TAKE 1 TABLET BY MOUTH EVERY DAY 04/04/18  Yes Bacigalupo, [name], MD  fluvoxaMINE (LUVOX) 25 MG tablet Take 1 tablet (25 mg total) by mouth at bedtime. 04/05/18  Yes Jomarie LongsEappen, Saramma, MD  gabapentin (NEURONTIN) 600 MG tablet Take 1 tablet (600 mg total) by mouth 2 (two) times daily. 01/19/18  Yes Bacigalupo, [name], MD  glimepiride (AMARYL) 2 MG tablet Take 1 tablet (2 mg total) by mouth daily with breakfast. 12/09/17  Yes Bacigalupo, [name], MD  insulin detemir (LEVEMIR) 100 UNIT/ML injection Inject 0.65 mLs (65 Units total) into the skin 2 (two)  times daily. 01/16/18  Yes Bacigalupo, [name], MD  losartan (COZAAR) 100 MG tablet Take 1 tablet (100 mg total) by mouth daily. 01/16/18  Yes Bacigalupo, [name], MD  metolazone (ZAROXOLYN) 2.5 MG tablet Take 1 tablet (2.5 mg total) by mouth 2 (two) times daily. 03/29/18  Yes Bacigalupo, [name], MD  metoprolol (TOPROL-XL) 200 MG 24 hr tablet Take 200 mg by mouth daily.  12/22/17  Yes [provider]  NOVOLOG 100 UNIT/ML injection Inject 35 Units into the skin 3 (three) times daily with meals. Patient taking differently: Inject 40 Units into the skin 3 (three) times daily with meals.  12/26/17  Yes Bacigalupo, [name], MD  torsemide (DEMADEX) 20 MG tablet Take 2 tablets (40 mg total) by mouth 2 (two) times daily. Patient taking differently: Take 20 mg by mouth 2 (two) times daily.  11/25/17 04/07/18 Yes Clarisa KindredHackney, Tina A, FNP  warfarin (COUMADIN) 5 MG tablet Take 5 mg by mouth daily at 6 PM.  04/05/18  Yes [provider]  acetaminophen (TYLENOL) 325 MG tablet Take 650 mg by mouth every 6 (six) hours as needed.    [provider]    Allergies as of 04/07/2018 - Review Complete 04/07/2018  Allergen Reaction Noted  . Lexapro [escitalopram oxalate] Swelling 04/05/2018    Family History  Problem Relation Age of Onset  . CAD Mother   .  Hypertension Mother   . Hypertension Father   . Asthma Father   . Dementia Father   . Diabetes Brother   . Depression Brother   . Hypertension Brother   . Hypertension Brother   . Depression Brother   . Pancreatic cancer Paternal Aunt        several aunts and uncles with pancreatic cancer on paternal side  . Breast cancer Maternal Aunt   . Colon cancer Neg Hx   . Prostate cancer Neg Hx     Social History   Socioeconomic History  . Marital status: Married    Spouse name: teresa  . Number of children: 0  . Years of education: Not on file  . Highest education level: Master's degree (e.g., MA, MS, MEng, MEd, MSW, MBA)    Occupational History  . Occupation: retired    Comment: Building control surveyor  . Occupation: permanent disability  Social Needs  . Financial resource strain: Not hard at all  . Food insecurity:    Worry: Never true    Inability: Never true  . Transportation needs:    Medical: No    Non-medical: No  Tobacco Use  . Smoking status: Former Smoker    Packs/day: 0.50    Years: 20.00    Pack years: 10.00    Types: Cigarettes    Last attempt to quit: 04/25/2014    Years since quitting: 4.0  . Smokeless tobacco: Never Used  . Tobacco comment: quit around june 2015  Substance and Sexual Activity  . Alcohol use: No    Frequency: Never  . Drug use: Not Currently    Types: Cocaine    Comment: January 28, 2017 quit Cocaine  . Sexual activity: Never  Lifestyle  . Physical activity:    Days per week: 0 days    Minutes per session: 0 min  . Stress: To some extent  Relationships  . Social connections:    Talks on phone: Three times a week    Gets together: Three times a week    Attends religious service: Never    Active member of club or organization: No    Attends meetings of clubs or organizations: Never    Relationship status: Married  . Intimate partner violence:    Fear of current or ex partner: No    Emotionally abused: No    Physically abused: No    Forced sexual activity: No  Other Topics Concern  . Not on file  Social History Narrative  . Not on file    Review of Systems: See HPI, otherwise negative ROS  Physical Exam: BP (!) 136/103   Pulse (!) 124   Temp (!) 101.3 F (38.5 C) (Oral)   Resp (!) 24   Ht 5\' 10"  (1.778 m)   Wt (!) 425 lb (192.8 kg)   SpO2 (!) 88%   BMI 60.98 kg/m  General:   Alert,  pleasant and cooperative in NAD Head:  Normocephalic and atraumatic. Neck:  Supple; no masses or thyromegaly. Lungs:  Clear throughout to auscultation, normal respiratory effort.    Heart:  +S1, +S2, Regular rate and rhythm, No edema. Abdomen:  Soft, nontender and  nondistended. Normal bowel sounds, without guarding, and without rebound.   Neurologic:  Alert and  oriented x4;  grossly normal neurologically.  Impression/Plan: Alexander Alexander is here for an EGD for PEG placement Risks, benefits, limitations, and alternatives regarding the procedure have been reviewed with the patient.  Questions have been answered.  All parties agreeable.   Pasty Spillers, MD  04/28/2018, 11:36 AM

## 2018-04-28 NOTE — Progress Notes (Signed)
At approximately 1315, this patient dropped his HR into the 60's. HR recovered after approximately 30 seconds. Was and has remained asymptomatic, remains attached to Zoll defibrillator.

## 2018-04-28 NOTE — Progress Notes (Signed)
PULMONARY / CRITICAL CARE MEDICINE   Name: Alexander Alexander MRN: [id number] DOB: October 15, 1971    ADMISSION DATE:  04/07/2018  PT PROFILE:   89M with PMH of atrial fibrillation, status post ablation and aortic valve replacement on long term warfarin, CHF, CKD, diabetes, obstructive sleep apnea and hypertension, suffered a pulseless VT cardiac arrest in MRI when he was being evaluated for back pain and sciatica, subsequently intubated in the intensive care unit. Difficult airway noted  EVENTS/RESULTS: 06/11 CT chest: Dense RLL consolidation.  Segmental RML and LLL atelectasis.  Patchy GGO in upper lobes bilaterally 06/12 rough night with desaturation.  Transient bradycardia.  06/13 More responsive.  Gas exchange improving.  No further bradycardia.  Amiodarone has been stopped. AFRVR (up to 150/min).  Follows commands.  Remains on fentanyl infusion.  Propofol discontinued. 06/17 Gas exchange improving. Rate adequately controlled. Cognition unchanged. Remains on fentanyl infusion. Gastroenterology consultation requested for G tube placement 06/18 Trach tube placement. Post op hypoxemia. Vent changes made 06/19 Transition off of continuous fentanyl. Dexmedetomidine infusion initiated 6/21 Severe symptomatic bradycardia with HR of 29, hypotension and hypoxia after receiving propofol for attempt at G tube placement. Venous doppler- left calf peroneal DVT   INDWELLING DEVICES:: R IJ CVL 05/31 >> 06/10 ETT 05/31 >> 06/18 RUE PICC 06/10 >>  Trach tube 06/18 >>    MICRO DATA: MRSA PCR 05/31 >> NEG Urine 06/01 >> NEG Resp 06/01 >> MRSA Blood 06/01 >> NEG C diff 06/04 >> NEG  Urine 06/05 >> NEG Resp 06/05 >> MRSA Blood 06/05 >> NEG Resp 06/11 >> MRSA C diff 6/17 >> NEG  ANTIMICROBIALS:  Pip-tazo 05/31 >> 06/03 Vanc 05/31 >> 6/12 Cefepime 06/11 >> 06/15 Linezolid 06/12 >>   SUBJECTIVE:  Intermittently agitated.  Intermittently follows commands.  VITAL SIGNS: BP 116/86   Pulse (!) 198    Temp (!) 100.5 F (38.1 C) (Oral)   Resp (!) 26   Ht 5\' 10"  (1.778 m)   Wt (!) 425 lb (192.8 kg)   SpO2 97%   BMI 60.98 kg/m   HEMODYNAMICS:  Episode of profound bradycardia and hypotension after receiving propofol during attempt at G tube placement.  VENTILATOR SETTINGS: Vent Mode: PCV FiO2 (%):  [60 %-100 %] 60 % Set Rate:  [16 bmp] 16 bmp PEEP:  [10 cmH20] 10 cmH20 Plateau Pressure:  [33 cmH20] 33 cmH20  INTAKE / OUTPUT: I/O last 3 completed shifts: In: 6622.1 [I.V.:3672.1; NG/GT:2650; IV Piggyback:300] Out: 3300 [Urine:3300]  PHYSICAL EXAMINATION: General: Awake after hypotensive event Neuro:  eye opening, followed simplecommands HEENT: NCAT, sclerae white Cardiovascular: bradycardia/ tacycardia, no M Lungs: Few rhonchi, no wheezes , trach site clear of secretions Abdomen: NT, ND, NABS, soft, diarrhoea Ext: 1+ LE edema  LABS:  BMET Recent Labs  Lab 04/26/18 0341 04/27/18 0509 04/28/18 0611  NA 146* 143 145  K 5.1 5.0 4.9  CL 108 109 110  CO2 30 26 27   BUN 94* 86* 82*  CREATININE 2.67* 2.57* 2.50*  GLUCOSE 231* 317* 170*    Electrolytes Recent Labs  Lab 04/22/18 0527  04/26/18 0341 04/27/18 0509 04/28/18 0611  CALCIUM 8.9   < > 9.1 8.5* 8.4*  MG 2.9*  --   --   --   --   PHOS 3.9  --   --   --  4.0   < > = values in this interval not displayed.    CBC Recent Labs  Lab 04/26/18 0341 04/27/18 0509 04/28/18 [id number]  WBC 13.4* 13.3* 12.2*  HGB 12.3* 12.2* 11.7*  HCT 38.5* 37.7* 35.0*  PLT 203 205 202    Coag's No results for input(s): APTT, INR in the last 168 hours.  Sepsis Markers Recent Labs  Lab 04/22/18 0527 04/27/18 1049  LATICACIDVEN  --  1.6  PROCALCITON 21.80  --     ABG Recent Labs  Lab 04/24/18 0412 04/28/18 0411 04/28/18 1327  PHART 7.27* 7.41 7.34*  PCO2ART 77* 46 49*  PO2ART 64* 76* 77*    Liver Enzymes Recent Labs  Lab 04/24/18 0412 04/27/18 1049 04/28/18 0611  AST 34 27  --   ALT 84* 41  --    ALKPHOS 64 68  --   BILITOT 1.0 1.0  --   ALBUMIN 2.6* 2.5* 2.6*    Cardiac Enzymes No results for input(s): TROPONINI, PROBNP in the last 168 hours.  Glucose Recent Labs  Lab 04/27/18 1607 04/27/18 1938 04/28/18 0004 04/28/18 0346 04/28/18 0743 04/28/18 1203  GLUCAP 212* 217* 182* 160* 137* 159*    CXR: No new film   ASSESSMENT / PLAN:  PULMONARY A: Respiratory arrest 05/31 Prolonged ventilator dependence / Acute Respiratory failure from Hypoxia Tracheostomy status Persistently high FiO2/PEEP requirements RLL consolidation/PNA P:   Cont vent support -tolerates PCV mode better than PRVC Cont vent bundle Daily SBT if/when meets criteria Continue nebulized bronchodilators Add nebulized steroids 06/19  CARDIOVASCULAR A:  Episode of bradycardia and hypotension secondary to Propofol VT cardiac arrest 05/31 (deemed secondary to respiratory rest) Chronic atrial fibrillation with RVR - rate control improved S/P mechanical aortic valve replacement P:  Precedex D/Ced; will start Dopamine prn MAP goal > 65 mmHg Continue heparin infusion until after G-tube placement Transition to warfarin after all procedures completed Continue enteral metoprolol initiated 6/11 Continue enteral diltiazem, initiated 6/17, dose increased 6/18  RENAL A:   CKD, baseline creatinine 2.0-2.5 Nonoliguric AKI worsened after acute event today Mild hypervolemia, resolved Mild hypernatremia resolved P:   Monitor BMET intermittently Monitor I/Os Correct electrolytes as indicated  Free water increased 6/18  GASTROINTESTINAL A:   Abdominal distention, resolved P:   SUP: Enteral famotidine Continue TF protocol Peg tube placement for possible Friday pending GI availability   HEMATOLOGIC A:   Very mild ICU acquired anemia P:  DVT px: heparin infusion (AF, mechanical AVR) Monitor CBC intermittently Transfuse per usual guidelines  Minimize blood draws  INFECTIOUS A:   MRSA  pneumonia- repeated cultures negative Severe sepsis, resolved Pyrexia- cultures negative;Acute Left calf Peroneal DVT P:   Will D/C Linezolid today as patient has now completed 20 days treatment for MRSA pneumonia and repeated cultures are negative Monitor temp, WBC count Micro and abx as above  Patient is on Heparin drip  ENDOCRINE A:   DM 2 with severe insulin resistance/ Hyperglycemia P:   Continue Lantus  Continue SSI, resistance scale  NEUROLOGIC A:   Acute encephalopathy- improving ICU/vent associated discomfort P:   RASS goal: 0, -1 Continue Duragesic patch Continue clonazepam initiated 6/18 Fentanyl infusion discontinued 6/19 Precedex D/Ced   FAMILY: Wife updated at bedside  CCM time:  45 mins The above time includes time spent in consultation with patient and/or family members and reviewing care plan on multidisciplinary rounds  Jackson Latino, MD PCCM service Pager [phone] 04/28/2018 2:31 PM

## 2018-04-28 NOTE — Op Note (Signed)
[facility] Gastroenterology Patient Name: Alexander Alexander Procedure Date: 04/28/2018 11:13 AM MRN: [id number] Account #: [id number] Date of Birth: [birth year] Admit Type: Inpatient Age: 47 Room: [facility] ENDO ROOM 4 Gender: Male Note Status: Finalized Procedure:            Upper GI endoscopy Indications:          Dysphagia Providers:            Wyline Mood MD, MD Medicines:            Monitored Anesthesia Care Complications:        No immediate complications. Procedure:            Pre-Anesthesia Assessment:                       - Prior to the procedure, a History and Physical was                        performed, and patient medications, allergies and                        sensitivities were reviewed. The patient's tolerance of                        previous anesthesia was reviewed.                       - The risks and benefits of the procedure and the                        sedation options and risks were discussed with the                        patient. All questions were answered and informed                        consent was obtained.                       - ASA Grade Assessment: IV - A patient with severe                        systemic disease that is a constant threat to life.                       After obtaining informed consent, the endoscope was                        passed under direct vision. Throughout the procedure,                        the patient's blood pressure, pulse, and oxygen                        saturations were monitored continuously. The Endoscope                        was introduced through the mouth, and advanced to the                        third part of duodenum. The upper  GI endoscopy was                        accomplished with ease. The patient tolerated the                        procedure well. Findings:      The esophagus was normal.      The stomach was normal.      The examined duodenum was normal.      No  transillumination seen and hence G tube not placed Impression:           - Normal esophagus.                       - Normal stomach.                       - Normal examined duodenum.                       - No specimens collected. Recommendation:       - Return patient to hospital ward for ongoing care.                       - Continue Tube feeds and suggest G tube placement by                        Interventional radiology Procedure Code(s):    --- Professional ---                       [phone], Esophagogastroduodenoscopy, flexible, transoral;                        diagnostic, including collection of specimen(s) by                        brushing or washing, when performed (separate procedure) Diagnosis Code(s):    --- Professional ---                       R13.10, Dysphagia, unspecified CPT copyright 2017 American Medical Association. All rights reserved. The codes documented in this report are preliminary and upon coder review may  be revised to meet current compliance requirements. Wyline MoodKiran Edgel Degnan, MD Wyline MoodKiran Nora Sabey MD, MD 04/28/2018 11:32:33 AM This report has been signed electronically. Number of Addenda: 0 Note Initiated On: 04/28/2018 11:13 AM      [facility]

## 2018-04-28 NOTE — Progress Notes (Signed)
[employer] [facility] [place] [facility]   PATIENT NAME: Alexander Alexander    MR#:  [id number]  DATE OF BIRTH:  Feb 22, 1971  SUBJECTIVE:  CHIEF COMPLAINT:   Chief Complaint  Patient presents with  . Back Pain  s/p tracheostomy. The patient is sedated with Precedex. On vent via trach.  Unable to get PEG placement by GI today. REVIEW OF SYSTEMS:   Review of Systems  Unable to perform ROS: Medical condition    DRUG ALLERGIES:   Allergies  Allergen Reactions  . Lexapro [Escitalopram Oxalate] Swelling    VITALS:  Blood pressure 116/86, pulse (!) 198, temperature (!) 100.5 F (38.1 C), temperature source Oral, resp. rate (!) 26, height 5\' 10"  (1.778 m), weight (!) 425 lb (192.8 kg), SpO2 97 %.  PHYSICAL EXAMINATION:  GENERAL:  47 y.o.-year-old patient lying in the bed with no acute distress.  Morbid obesity. EYES: Pupils equal, round, reactive to light and accommodation. No scleral icterus. Extraocular muscles intact.  HEENT: Head atraumatic, normocephalic.  NECK:  Supple, no jugular venous distention. No thyroid enlargement, no tenderness. On trach LUNGS: Normal breath sounds bilaterally, no wheezing, rales,rhonchi or crepitation. No use of accessory muscles of respiration.  CARDIOVASCULAR: S1, S2 normal. No murmurs, rubs, or gallops.  ABDOMEN: Soft, nontender, nondistended. Bowel sounds present. No organomegaly or mass.  EXTREMITIES: b/l leg edema, no cyanosis, or clubbing.  NEUROLOGIC: Unable to exam. PSYCHIATRIC: The patient is sedated. SKIN: No obvious rash, lesion, or ulcer.   Physical Exam LABORATORY PANEL:   CBC Recent Labs  Lab 04/28/18 0611  WBC 12.2*  HGB 11.7*  HCT 35.0*  PLT 202   ------------------------------------------------------------------------------------------------------------------  Chemistries  Recent Labs  Lab 04/22/18 0527  04/27/18 1049 04/28/18 0611  NA 144   < >  --  145  K 4.4   < >  --  4.9  CL 108   < >  --  110  CO2  28   < >  --  27  GLUCOSE 178*   < >  --  170*  BUN 98*   < >  --  82*  CREATININE 3.03*   < >  --  2.50*  CALCIUM 8.9   < >  --  8.4*  MG 2.9*  --   --   --   AST  --    < > 27  --   ALT  --    < > 41  --   ALKPHOS  --    < > 68  --   BILITOT  --    < > 1.0  --    < > = values in this interval not displayed.   ------------------------------------------------------------------------------------------------------------------  Cardiac Enzymes No results for input(s): TROPONINI in the last 168 hours. ------------------------------------------------------------------------------------------------------------------  RADIOLOGY:  Dg Chest Port 1 View  Result Date: 04/28/2018 CLINICAL DATA:  Respiratory failure EXAM: PORTABLE CHEST 1 VIEW COMPARISON:  April 27, 2018 FINDINGS: Tracheostomy catheter tip is 5.1 cm above the carina. Nasogastric tube tip and side port are below the diaphragm. No pneumothorax. There is bibasilar atelectatic change. Lungs elsewhere clear. Heart is mildly enlarged with pulmonary vascularity normal. Patient is status post median sternotomy. No evident bone lesions. No appreciable adenopathy. IMPRESSION: Tube positions as described without pneumothorax. Bibasilar atelectasis. Stable cardiac prominence. Electronically Signed   By: Bretta BangWilliam  Woodruff III M.D.   On: 04/28/2018 13:55   Dg Chest Port 1 View  Result Date: 04/27/2018 CLINICAL DATA:  Respiratory  failure EXAM: PORTABLE CHEST 1 VIEW COMPARISON:  Two days ago FINDINGS: Tracheostomy tube in good position. An orogastric tube reaches the stomach. There is artifact from EKG leads. Right upper extremity PICC with tip at the upper cavoatrial junction. Very low lung volumes with diffuse hazy chest opacity. Asymmetric parenchymal opacity on the left. Cardiac enlargement. Prior median sternotomy. No evidence of pneumothorax. IMPRESSION: 1. Stable hardware positioning. 2. Low volume chest with bilateral atelectasis or pneumonia and  possible layering effusions. No convincing change from 2 days ago. Electronically Signed   By: Marnee Spring M.D.   On: 04/27/2018 07:36    ASSESSMENT AND PLAN:  47 year old male with past medical history significant for A. fib status post ablation, aortic valve replacement on warfarin, history of congestive heart failure, CKD, morbid obesity, diabetes and sleep apnea who was getting an outpatient MRI of his lower back had a cardiac arrest.  *Acute cardiopulmonary arrest From respiratory causes Difficult weaning from vent,s/p tracheostomy. Continue ventilation.  *AcuteMRSA/VAP ContinueLinezolid,vent protocol   *Atrial fibrillationwith RVR Stable Continuemetoprolol,Cardizem, amiodarone, continue Heparin drip.  *History of mechanical aortic valve replacement Stable Onheparin drip  *Acute toxic metabolic encephalopathy  Neurology input appreciated  Continue Kepprafor possible partial seizures  *Chronic diabetes mellitus type 2 Stable on current regiment  *acute renal failurew/ CKD stage III. Secondary toATN.  Improving.  *Acute septic shock, MRSA pneumonia Resolved Plan to complete 14 days linezolid  PEG tube placement by IR per GI.  Need LTAC placement per Dr. Sung Amabile. I discussed with Dr Gerlene Burdock. All the records are reviewed and case discussed with Care Management/Social Workerr. Management plans discussed with the patient's wife and they are in agreement.  CODE STATUS: full  TOTAL TIME TAKING CARE OF THIS PATIENT: 32 minutes.   POSSIBLE D/C IN ? DAYS, DEPENDING ON CLINICAL CONDITION.   Shaune Pollack M.D on 04/28/2018   Between 7am to 6pm - Pager - [phone]  After 6pm go to [email or web address] - password [email or web address]  [facility]  Office  [phone]  CC: Primary care physician; Erasmo Downer, MD  Note: This dictation was prepared with Dragon dictation along with smaller phrase technology. Any transcriptional errors that  result from this process are unintentional.

## 2018-04-28 NOTE — Progress Notes (Signed)
See EGD report from today performed by Dr. Robbie LisAnna  Unable to find good one-to-one indentation, and transillumination for safe PEG tube placement.  Recommend interventional radiology consult for feeding tube placements Okay to resume heparin drip if no other procedures planned for today by primary team and interventional radiology  Above was discussed with intensivist Dr. Peggye Pittichards, and they will be contacting interventional radiology directly and resuming heparin drip appropriately.

## 2018-04-28 NOTE — Progress Notes (Signed)
[place] for heparin drip management Indication: atrial fibrillation and aortic valve replacement   Allergies  Allergen Reactions  . Lexapro [Escitalopram Oxalate] Swelling    Patient Measurements: Height: 5' 10" (177.8 cm) Weight: (!) 425 lb (192.8 kg) IBW/kg (Calculated) : 73 Heparin Dosing Weight: 121 kg (updated 6/21)  Vital Signs: Temp: 100.5 F (38.1 C) (06/21 1130) Temp Source: Oral (06/21 1130) BP: 102/83 (06/21 1218) Pulse Rate: 128 (06/21 1200)  Labs: Recent Labs    04/26/18 0341 04/26/18 0643 04/26/18 1305 04/27/18 0509 04/28/18 0611  HGB 12.3*  --   --  12.2* 11.7*  HCT 38.5*  --   --  37.7* 35.0*  PLT 203  --   --  205 202  HEPARINUNFRC  --  0.45 0.54 0.55  --   CREATININE 2.67*  --   --  2.57* 2.50*    Estimated Creatinine Clearance: 61.8 mL/min (A) (by C-G formula based on SCr of 2.5 mg/dL (H)).   Medical History: Past Medical History:  Diagnosis Date  . Allergy   . Anxiety   . Arrhythmia   . Arthritis   . Atrial fibrillation ([place])   . CHF (congestive heart failure) ([place])   . CKD (chronic kidney disease)   . Diabetes mellitus without complication ([place])   . Diabetes mellitus, type II ([place])   . Heart failure ([place])   . Hyperlipidemia   . Hypertension   . Pancreatitis   . Sleep apnea     Medications:  Scheduled:  . budesonide (PULMICORT) nebulizer solution  0.25 mg Nebulization Q6H  . chlorhexidine gluconate (MEDLINE KIT)  15 mL Mouth Rinse BID  . clonazePAM  1 mg Per Tube Daily  . clonazePAM  2 mg Oral QHS  . diltiazem  60 mg Oral Q6H  . famotidine  20 mg Per Tube Daily  . feeding supplement (PRO-STAT SUGAR FREE 64)  30 mL Per Tube BID  . fentaNYL  150 mcg Transdermal Q72H  . free water  200 mL Per Tube Q4H  . insulin aspart  0-20 Units Subcutaneous Q4H  . insulin glargine  85 Units Subcutaneous BID  . ipratropium-albuterol  3 mL Nebulization Q6H  . mouth rinse  15 mL Mouth Rinse 10 times per  day  . metoprolol tartrate  50 mg Per Tube BID  . QUEtiapine  100 mg Per Tube QHS  . sodium chloride flush  10-40 mL Intracatheter Q12H   Infusions:  . dexmedetomidine (PRECEDEX) IV infusion Stopped (04/28/18 1131)  . dextrose 50 mL/hr at 04/28/18 1000  . feeding supplement (VITAL HIGH PROTEIN) Stopped (04/28/18 0000)  . heparin 2,000 Units/hr (04/28/18 1207)    Assessment: Pharmacy consulted for warfarin and heparin drip management for 47 yo male admitted to the ICU s/p cardiac arrest. Patient has history significant for aortic valve replacement, atrial fibrillation and CHF. Head CT is negative for infarction or hemorrhage.   Patient takes warfarin 3m daily as an outpatient for goal INR 2.5-3.5.    Goal of Therapy:  INR 2-3 Heparin level 0.3-0.7 units/ml Monitor platelets by anticoagulation protocol: Yes   Plan:  Heparin was held for PEG tube placement and was unable to place. Will restart heparin at 2000 units/hr at 1200 on 6/21. Obtain heparin level at 1800.   Pharmacy will continue to monitor and adjust per consult.   CTyson Babinski  04/28/2018 12:37 PM

## 2018-04-28 NOTE — Progress Notes (Addendum)
Nutrition Follow-up  DOCUMENTATION CODES:   Morbid obesity  INTERVENTION:   Continue Vital High Protein at 80 mL/hr (1920 mL goal daily volume) + Pro-Stat 30 mL BID per NGT. Provides 2120 kcal (98% estimated needs), 198 grams of protein, 215 grams of carbohydrate, 1613 mL H2O daily.  When tube feeds run at goal rate patient will receive 9 grams of carbohydrates per hour.  With free water flush of 200 mL Q8hrs patient will receive a total of 2213 mL H2O daily including water in tube feeding.  Bowel regimen per MD  NUTRITION DIAGNOSIS:   Inadequate oral intake related to inability to eat as evidenced by NPO status.  Ongoing - addressing with TF regimen.  GOAL:   Provide needs based on ASPEN/SCCM guidelines  Met with TF regimen.  MONITOR:   Vent status, Labs, Weight trends, Skin, TF tolerance, I & O's  ASSESSMENT:   47 year old male with PMHx of A-fib s/p ablation and mechanical aortic valve replacement on warfarin, HTN, DM type 2, CHF, OSA, anxiety, CKD, HLD, pancreatitis who presented with lower back pain and sciatica. While in MRI on 5/31 suite patient suffered pulseless V. tach cardiac arrest with 5 minutes ACLS before ROSC obtained, and was intubated.   Pt tolerating tube feedings well via NGT. Plan was for PEG tube placement today. Pt s/p EGD this morning; PEG tube was not placed. Per MD, "no transillumination seen and hence G tube not placed". GI recommending IR placement of G-tube. CBG's improved. Continue tube feeds at goal rate. Per chart, pt with significant weight gain since admit; +9.6L on I & Os. Pt is noted to have mild to moderate edema. Type 7 stools noted. C-diff negative. Bowel regimen per MD. Pt also noted to have new fever.   Medications reviewed and include: pepcid, fentanyl, novolog insulin prn, Lantus 85 units BID, precedex, Dextrose 5% @50ml /hr   Labs reviewed: K 4.9 wnl, BUN 82(H), creat 2.50(H), P 4.0 wnl, Ca 8.4(L) adj. 9.52 wnl, alb 2.6(L) Wbc-  12.2(L) cbgs- 182, 160, 137 x 24 hrs  Patient is currently intubated on ventilator support MV: 13.1 L/min Temp (24hrs), Avg:100.4 F (38 C), Min:99.5 F (37.5 C), Max:101.3 F (38.5 C)  Propofol: none   MAP- >67mHg  Diet Order:   Diet Order           Diet NPO time specified Except for: Sips with Meds  Diet effective midnight         EDUCATION NEEDS:   No education needs have been identified at this time  Skin:  Skin Assessment: Skin Integrity Issues: Skin Integrity Issues:: Incisions Stage II: N/A - no longer being documented Incisions: closed incision to neck  Last BM:  6/20- TYPE 7- rectal tube   Height:   Ht Readings from Last 1 Encounters:  04/21/18 5' 10"  (1.778 m)    Weight:   Wt Readings from Last 1 Encounters:  04/28/18 (!) 425 lb (192.8 kg)    Ideal Body Weight:  75.5 kg  BMI:  Body mass index is 60.98 kg/m.  Estimated Nutritional Needs:   Kcal:  2171 (80% PSU2003b w/ MSJ 2556)  Protein:  189 grams (2.5 grams/kg IBW)  Fluid:  1.8 L/day (25 mL/kg IBW)  CKoleen DistanceMS, RD, LDN Pager #- [phone]#- [phone] Hours Pager: [phone]

## 2018-04-29 ENCOUNTER — Inpatient Hospital Stay: Payer: Medicare HMO

## 2018-04-29 DIAGNOSIS — R7989 Other specified abnormal findings of blood chemistry: Secondary | ICD-10-CM

## 2018-04-29 LAB — RENAL FUNCTION PANEL
ALBUMIN: 2.5 g/dL — AB (ref 3.5–5.0)
Anion gap: 8 (ref 5–15)
BUN: 77 mg/dL — ABNORMAL HIGH (ref 6–20)
CALCIUM: 8.4 mg/dL — AB (ref 8.9–10.3)
CO2: 27 mmol/L (ref 22–32)
CREATININE: 2.67 mg/dL — AB (ref 0.61–1.24)
Chloride: 108 mmol/L (ref 101–111)
GFR calc Af Amer: 31 mL/min — ABNORMAL LOW (ref >60)
GFR, EST NON AFRICAN AMERICAN: 26 mL/min — AB (ref >60)
Glucose, Bld: 114 mg/dL — ABNORMAL HIGH (ref 65–99)
PHOSPHORUS: 4.9 mg/dL — AB (ref 2.5–4.6)
Potassium: 4.4 mmol/L (ref 3.5–5.1)
SODIUM: 143 mmol/L (ref 135–145)

## 2018-04-29 LAB — GLUCOSE, CAPILLARY
GLUCOSE-CAPILLARY: 105 mg/dL — AB (ref 65–99)
GLUCOSE-CAPILLARY: 131 mg/dL — AB (ref 65–99)
GLUCOSE-CAPILLARY: 146 mg/dL — AB (ref 65–99)
GLUCOSE-CAPILLARY: 78 mg/dL (ref 65–99)
Glucose-Capillary: 94 mg/dL (ref 65–99)
Glucose-Capillary: 64 mg/dL — ABNORMAL LOW (ref 65–99)
Glucose-Capillary: 123 mg/dL — ABNORMAL HIGH (ref 65–99)
Glucose-Capillary: 125 mg/dL — ABNORMAL HIGH (ref 65–99)
Glucose-Capillary: 111 mg/dL — ABNORMAL HIGH (ref 65–99)

## 2018-04-29 LAB — CBC
HCT: 34.5 % — ABNORMAL LOW (ref 40.0–52.0)
HEMOGLOBIN: 11.3 g/dL — AB (ref 13.0–18.0)
MCH: 27.9 pg (ref 26.0–34.0)
MCHC: 32.7 g/dL (ref 32.0–36.0)
MCV: 85.4 fL (ref 80.0–100.0)
PLATELETS: 198 10*3/uL (ref 150–440)
RBC: 4.04 MIL/uL — AB (ref 4.40–5.90)
RDW: 18.6 % — ABNORMAL HIGH (ref 11.5–14.5)
WBC: 11.5 10*3/uL — AB (ref 3.8–10.6)

## 2018-04-29 LAB — URINE CULTURE: CULTURE: NO GROWTH

## 2018-04-29 LAB — HEPARIN LEVEL (UNFRACTIONATED)
Heparin Unfractionated: 0.16 IU/mL — ABNORMAL LOW (ref 0.30–0.70)
Heparin Unfractionated: 0.42 IU/mL (ref 0.30–0.70)
Heparin Unfractionated: 0.51 IU/mL (ref 0.30–0.70)

## 2018-04-29 LAB — TROPONIN I: TROPONIN I: 0.11 ng/mL — AB (ref <0.03)

## 2018-04-29 MED ORDER — DEXTROSE 50 % IV SOLN
INTRAVENOUS | Status: AC
  Administered 2018-04-29: 50 mL via INTRAVENOUS
  Filled 2018-04-29: qty 50

## 2018-04-29 MED ORDER — DEXTROSE 50 % IV SOLN
50.0000 mL | Freq: Once | INTRAVENOUS | Status: AC
  Administered 2018-04-29: 50 mL via INTRAVENOUS

## 2018-04-29 MED ORDER — LOPERAMIDE HCL 1 MG/7.5ML PO SUSP
4.0000 mg | Freq: Four times a day (QID) | ORAL | Status: DC | PRN
  Filled 2018-04-29: qty 30

## 2018-04-29 MED ORDER — LOPERAMIDE HCL 1 MG/5ML PO LIQD
4.0000 mg | ORAL | Status: DC | PRN
  Administered 2018-04-29 – 2018-04-30 (×2): 4 mg via ORAL
  Filled 2018-04-29 (×4): qty 20

## 2018-04-29 MED FILL — Medication: Qty: 1 | Status: AC

## 2018-04-29 NOTE — Progress Notes (Signed)
[employer] [facility] - [place] [facility]   PATIENT NAME: Alexander Alexander    MR#:  [id number]  DATE OF BIRTH:  04/26/1971  SUBJECTIVE:  CHIEF COMPLAINT:   Chief Complaint  Patient presents with  . Back Pain  Patient remains critically ill, for PEG tube placement possibly next week  REVIEW OF SYSTEMS:  CONSTITUTIONAL: No fever, fatigue or weakness.  EYES: No blurred or double vision.  EARS, NOSE, AND THROAT: No tinnitus or ear pain.  RESPIRATORY: No cough, shortness of breath, wheezing or hemoptysis.  CARDIOVASCULAR: No chest pain, orthopnea, edema.  GASTROINTESTINAL: No nausea, vomiting, diarrhea or abdominal pain.  GENITOURINARY: No dysuria, hematuria.  ENDOCRINE: No polyuria, nocturia,  HEMATOLOGY: No anemia, easy bruising or bleeding SKIN: No rash or lesion. MUSCULOSKELETAL: No joint pain or arthritis.   NEUROLOGIC: No tingling, numbness, weakness.  PSYCHIATRY: No anxiety or depression.   ROS  DRUG ALLERGIES:   Allergies  Allergen Reactions  . Lexapro [Escitalopram Oxalate] Swelling    VITALS:  Blood pressure (!) 141/89, pulse (!) 109, temperature 98.8 F (37.1 C), temperature source Axillary, resp. rate 18, height 5\' 10"  (1.778 m), weight (!) 185.1 kg (408 lb), SpO2 97 %.  PHYSICAL EXAMINATION:  GENERAL:  47 y.o.-year-old patient lying in the bed with no acute distress.  EYES: Pupils equal, round, reactive to light and accommodation. No scleral icterus. Extraocular muscles intact.  HEENT: Head atraumatic, normocephalic. Oropharynx and nasopharynx clear.  NECK:  Supple, no jugular venous distention. No thyroid enlargement, no tenderness.  LUNGS: Normal breath sounds bilaterally, no wheezing, rales,rhonchi or crepitation. No use of accessory muscles of respiration.  CARDIOVASCULAR: S1, S2 normal. No murmurs, rubs, or gallops.  ABDOMEN: Soft, nontender, nondistended. Bowel sounds present. No organomegaly or mass.  EXTREMITIES: No pedal edema, cyanosis, or  clubbing.  NEUROLOGIC: Cranial nerves II through XII are intact. Muscle strength 5/5 in all extremities. Sensation intact. Gait not checked.  PSYCHIATRIC: The patient is alert and oriented x 3.  SKIN: No obvious rash, lesion, or ulcer.   Physical Exam LABORATORY PANEL:   CBC Recent Labs  Lab 04/29/18 0527  WBC 11.5*  HGB 11.3*  HCT 34.5*  PLT 198   ------------------------------------------------------------------------------------------------------------------  Chemistries  Recent Labs  Lab 04/27/18 1049  04/29/18 0527  NA  --    < > 143  K  --    < > 4.4  CL  --    < > 108  CO2  --    < > 27  GLUCOSE  --    < > 114*  BUN  --    < > 77*  CREATININE  --    < > 2.67*  CALCIUM  --    < > 8.4*  AST 27  --   --   ALT 41  --   --   ALKPHOS 68  --   --   BILITOT 1.0  --   --    < > = values in this interval not displayed.   ------------------------------------------------------------------------------------------------------------------  Cardiac Enzymes Recent Labs  Lab 04/28/18 2055 04/29/18 0218  TROPONINI 0.11* 0.11*   ------------------------------------------------------------------------------------------------------------------  RADIOLOGY:  US Venous Img Lower Bilateral  Result Date: 04/28/2018 CLINICAL DATA:  Fever, lower extremity edema EXAM: BILATERAL LOWER EXTREMITY VENOUS DOPPLER ULTRASOUND TECHNIQUE: Gray-scale sonography with graded compression, as well as color Doppler and duplex ultrasound were performed to evaluate the lower extremity deep venous systems from the level of the common femoral vein and including the common  femoral, femoral, profunda femoral, popliteal and calf veins including the posterior tibial, peroneal and gastrocnemius veins when visible. The superficial great saphenous vein was also interrogated. Spectral Doppler was utilized to evaluate flow at rest and with distal augmentation maneuvers in the common femoral, femoral and popliteal  veins. COMPARISON:  None. FINDINGS: Limited because of body habitus. RIGHT LOWER EXTREMITY Common Femoral Vein: No evidence of thrombus. Normal compressibility, respiratory phasicity and response to augmentation. Saphenofemoral Junction: No evidence of thrombus. Normal compressibility and flow on color Doppler imaging. Profunda Femoral Vein: No evidence of thrombus. Normal compressibility and flow on color Doppler imaging. Femoral Vein: No evidence of thrombus. Normal compressibility, respiratory phasicity and response to augmentation. Popliteal Vein: No evidence of thrombus. Normal compressibility, respiratory phasicity and response to augmentation. Calf Veins: Limited visualization.  No gross occlusive thrombus. Superficial Great Saphenous Vein: No evidence of thrombus. Normal compressibility. Venous Reflux:  None. Other Findings:  None. LEFT LOWER EXTREMITY Common Femoral Vein: No evidence of thrombus. Normal compressibility, respiratory phasicity and response to augmentation. Saphenofemoral Junction: No evidence of thrombus. Normal compressibility and flow on color Doppler imaging. Profunda Femoral Vein: No evidence of thrombus. Normal compressibility and flow on color Doppler imaging. Femoral Vein: No evidence of thrombus. Normal compressibility, respiratory phasicity and response to augmentation. Popliteal Vein: No evidence of thrombus. Normal compressibility, respiratory phasicity and response to augmentation. Calf Veins: Limited exam. However, the left tibial veins appear patent. But the left peroneal veins are noncompressible with hypoechoic thrombus appearing occlusive. No propagation into the popliteal or femoral veins. Superficial Great Saphenous Vein: No evidence of thrombus. Normal compressibility. Venous Reflux:  None. Other Findings:  None. IMPRESSION: Limited exam because of body habitus. Positive exam for left calf peroneal DVT without propagation into the femoral or popliteal veins. Very low  thrombus burden. No significant right lower extremity DVT. Electronically Signed   By: Judie Petit.  Shick M.D.   On: 04/28/2018 16:02   Dg Chest Port 1 View  Result Date: 04/28/2018 CLINICAL DATA:  Respiratory failure EXAM: PORTABLE CHEST 1 VIEW COMPARISON:  April 27, 2018 FINDINGS: Tracheostomy catheter tip is 5.1 cm above the carina. Nasogastric tube tip and side port are below the diaphragm. No pneumothorax. There is bibasilar atelectatic change. Lungs elsewhere clear. Heart is mildly enlarged with pulmonary vascularity normal. Patient is status post median sternotomy. No evident bone lesions. No appreciable adenopathy. IMPRESSION: Tube positions as described without pneumothorax. Bibasilar atelectasis. Stable cardiac prominence. Electronically Signed   By: Bretta Bang III M.D.   On: 04/28/2018 13:55    ASSESSMENT AND PLAN:  47 year old male with past medical history significant for A. fib status post ablation, aortic valve replacement on warfarin, history of congestive heart failure, CKD, morbid obesity, diabetes and sleep apnea who was getting an outpatient MRI of his lower back had a cardiac arrest.  *Acute cardiopulmonary arrest From respiratory causes Difficult weaning from vent,s/p tracheostomy Continue mechanical ventilation with weaning as tolerated  *AcuteMRSA/VAP Resolved Treated with Linezolid,vent protocol   *Atrial fibrillationwith RVR Stable Continuemetoprolol,Cardizem, amiodarone, continue Heparin drip.  *History of mechanical aortic valve replacement Stable Onheparin drip  *Acute toxic metabolic encephalopathy  Neurology input appreciated  Continue Kepprafor possible partial seizures  *Chronic diabetes mellitus type 2 Stable on current regiment  *acute renal failurew/ CKD stage III Resolving Secondary toATN  *Acute septic shock, MRSA pneumonia Resolved Plan to complete 14 days linezolid  *F/E/N For PEG tube placement by IR next week per  GI   Disposition to LTAC status  post discharge   All the records are reviewed and case discussed with Care Management/Social Workerr. Management plans discussed with the patient, family and they are in agreement.  CODE STATUS: full  TOTAL TIME TAKING CARE OF THIS PATIENT: 35 minutes.     POSSIBLE D/C IN 3-5 DAYS, DEPENDING ON CLINICAL CONDITION.   Evelena AsaMontell D Salary M.D on 04/29/2018   Between 7am to 6pm - Pager - [phone]  After 6pm go to [email or web address] - password [email or web address]  [facility]  Office  [phone]  CC: Primary care physician; Erasmo DownerBacigalupo, Angela M, MD  Note: This dictation was prepared with Dragon dictation along with smaller phrase technology. Any transcriptional errors that result from this process are unintentional.

## 2018-04-29 NOTE — Progress Notes (Signed)
[place] for heparin drip management Indication: atrial fibrillation and aortic valve replacement   Allergies  Allergen Reactions  . Lexapro [Escitalopram Oxalate] Swelling    Patient Measurements: Height: 5' 10"  (177.8 cm) Weight: (!) 408 lb (185.1 kg) IBW/kg (Calculated) : 73 Heparin Dosing Weight: 121 kg (updated 6/21)  Vital Signs: Temp: 99.4 F (37.4 C) (06/22 2000) Temp Source: Oral (06/22 2000) BP: 175/102 (06/22 2000) Pulse Rate: 100 (06/22 2000)  Labs: Recent Labs    04/27/18 0509 04/28/18 4128 04/28/18 1505  04/28/18 2055 04/29/18 0218 04/29/18 0527 04/29/18 1352 04/29/18 2039  HGB 12.2* 11.7*  --   --   --   --  11.3*  --   --   HCT 37.7* 35.0*  --   --   --   --  34.5*  --   --   PLT 205 202  --   --   --   --  198  --   --   HEPARINUNFRC 0.55  --   --    < >  --   --  0.16* 0.42 0.51  CREATININE 2.57* 2.50* 2.80*  --   --   --  2.67*  --   --   TROPONINI  --   --  0.11*  --  0.11* 0.11*  --   --   --    < > = values in this interval not displayed.    Estimated Creatinine Clearance: 55.8 mL/min (A) (by C-G formula based on SCr of 2.67 mg/dL (H)).   Medical History: Past Medical History:  Diagnosis Date  . Allergy   . Anxiety   . Arrhythmia   . Arthritis   . Atrial fibrillation ([place])   . CHF (congestive heart failure) ([place])   . CKD (chronic kidney disease)   . Diabetes mellitus without complication ([place])   . Diabetes mellitus, type II ([place])   . Heart failure ([place])   . Hyperlipidemia   . Hypertension   . Pancreatitis   . Sleep apnea     Medications:  Scheduled:  . budesonide (PULMICORT) nebulizer solution  0.25 mg Nebulization Q6H  . chlorhexidine gluconate (MEDLINE KIT)  15 mL Mouth Rinse BID  . clonazePAM  1 mg Per Tube Daily  . clonazePAM  2 mg Oral QHS  . diltiazem  60 mg Oral Q6H  . famotidine  20 mg Per Tube Daily  . feeding supplement (PRO-STAT SUGAR FREE 64)  30 mL Per Tube BID  .  fentaNYL  150 mcg Transdermal Q72H  . free water  200 mL Per Tube Q4H  . insulin aspart  0-20 Units Subcutaneous Q4H  . insulin glargine  85 Units Subcutaneous BID  . ipratropium-albuterol  3 mL Nebulization Q6H  . mouth rinse  15 mL Mouth Rinse 10 times per day  . metoprolol tartrate  50 mg Per Tube BID  . QUEtiapine  100 mg Per Tube QHS  . sodium chloride flush  10-40 mL Intracatheter Q12H   Infusions:  . DOPamine    . feeding supplement (VITAL HIGH PROTEIN) 60 mL/hr at 04/29/18 2000  . heparin 2,150 Units/hr (04/29/18 2000)    Assessment: Pharmacy consulted for warfarin and heparin drip management for 47 yo male admitted to the ICU s/p cardiac arrest. Patient has history significant for aortic valve replacement, atrial fibrillation and CHF. Head CT is negative for infarction or hemorrhage.   Patient takes warfarin 10m daily as an outpatient  for goal INR 2.5-3.5.    Goal of Therapy:  INR 2-3 Heparin level 0.3-0.7 units/ml Monitor platelets by anticoagulation protocol: Yes   Plan:  06/22 @ 1400 HL 0.42 is therapeutic. Will continue with current rate of 2150 units/hr. Will recheck HL @ 2000 for confirmation, CBC stable.  06/22 @ 2100 HL 0.51 is therapeutic. Will continue with current rate of 2150 units/hr. Will recheck HL @ 0500 for confirmation, CBC stable.  Thomasenia Sales, PharmD, MBA, BCGP Clinical Pharmacist  04/29/2018 9:13 PM

## 2018-04-29 NOTE — Progress Notes (Signed)
PULMONARY / CRITICAL CARE MEDICINE   Name: Alexander RiffleOtis Leon Semel MRN: [id number] DOB: 01-06-1971    ADMISSION DATE:  04/07/2018  PT PROFILE:   22M with PMH of atrial fibrillation, status post ablation and aortic valve replacement on long term warfarin, CHF, CKD, diabetes, obstructive sleep apnea and hypertension, suffered a pulseless VT cardiac arrest in MRI when he was being evaluated for back pain and sciatica, subsequently intubated in the intensive care unit. Difficult airway noted  EVENTS/RESULTS: 06/11 CT chest: Dense RLL consolidation.  Segmental RML and LLL atelectasis.  Patchy GGO in upper lobes bilaterally 06/12 rough night with desaturation.  Transient bradycardia.  06/13 More responsive.  Gas exchange improving.  No further bradycardia.  Amiodarone has been stopped. AFRVR (up to 150/min).  Follows commands.  Remains on fentanyl infusion.  Propofol discontinued. 06/17 Gas exchange improving. Rate adequately controlled. Cognition unchanged. Remains on fentanyl infusion. Gastroenterology consultation requested for G tube placement 06/18 Trach tube placement. Post op hypoxemia. Vent changes made 06/19 Transition off of continuous fentanyl. Dexmedetomidine infusion initiated 6/21 Severe symptomatic bradycardia with HR of 29, hypotension and hypoxia after receiving propofol for attempt at G tube placement. Venous doppler- left calf peroneal DVT   INDWELLING DEVICES:: R IJ CVL 05/31 >> 06/10 ETT 05/31 >> 06/18 RUE PICC 06/10 >>  Trach tube 06/18 >>    MICRO DATA: MRSA PCR 05/31 >> NEG Urine 06/01 >> NEG Resp 06/01 >> MRSA Blood 06/01 >> NEG C diff 06/04 >> NEG  Urine 06/05 >> NEG Resp 06/05 >> MRSA Blood 06/05 >> NEG Resp 06/11 >> MRSA C diff 6/17 >> NEG  ANTIMICROBIALS:  Pip-tazo 05/31 >> 06/03 Vanc 05/31 >> 6/12 Cefepime 06/11 >> 06/15 Linezolid 06/12 >>   SUBJECTIVE:  Intermittently agitated.  Intermittently follows commands.  VITAL SIGNS: BP (!) 175/102 (BP  Location: Left Arm)   Pulse 100   Temp 99.4 F (37.4 C) (Oral)   Resp 20   Ht 5\' 10"  (1.778 m)   Wt (!) 408 lb (185.1 kg)   SpO2 95%   BMI 58.54 kg/m   HEMODYNAMICS:  Episode of profound bradycardia and hypotension after receiving propofol during attempt at G tube placement.  VENTILATOR SETTINGS: Vent Mode: PCV FiO2 (%):  [40 %-60 %] 40 % Set Rate:  [16 bmp] 16 bmp PEEP:  [10 cmH20] 10 cmH20 Plateau Pressure:  [29 cmH20] 29 cmH20  INTAKE / OUTPUT: I/O last 3 completed shifts: In: 3614.9 [I.V.:1852.1; NG/GT:1762.8] Out: 3400 [Urine:3400]  PHYSICAL EXAMINATION: General: Awake alert and appropriate to all commands Neuro:  eye opening, followed simplecommands HEENT: NCAT, sclerae white Cardiovascular: bradycardia/ tacycardia, no M Lungs: Few rhonchi, no wheezes , trach site clear of secretions Abdomen: NT, ND, NABS, soft, diarrhoea Ext: 1+ LE edema  LABS:  BMET Recent Labs  Lab 04/28/18 0611 04/28/18 1505 04/29/18 0527  NA 145 144 143  K 4.9 4.9 4.4  CL 110 109 108  CO2 27 26 27   BUN 82* 78* 77*  CREATININE 2.50* 2.80* 2.67*  GLUCOSE 170* 109* 114*    Electrolytes Recent Labs  Lab 04/28/18 0611 04/28/18 1505 04/29/18 0527  CALCIUM 8.4* 8.4* 8.4*  PHOS 4.0  --  4.9*    CBC Recent Labs  Lab 04/27/18 0509 04/28/18 0611 04/29/18 0527  WBC 13.3* 12.2* 11.5*  HGB 12.2* 11.7* 11.3*  HCT 37.7* 35.0* 34.5*  PLT 205 202 198    Coag's No results for input(s): APTT, INR in the last 168 hours.  Sepsis Markers  Recent Labs  Lab 04/27/18 1049  LATICACIDVEN 1.6    ABG Recent Labs  Lab 04/24/18 0412 04/28/18 0411 04/28/18 1327  PHART 7.27* 7.41 7.34*  PCO2ART 77* 46 49*  PO2ART 64* 76* 77*    Liver Enzymes Recent Labs  Lab 04/24/18 0412 04/27/18 1049 04/28/18 0611 04/29/18 0527  AST 34 27  --   --   ALT 84* 41  --   --   ALKPHOS 64 68  --   --   BILITOT 1.0 1.0  --   --   ALBUMIN 2.6* 2.5* 2.6* 2.5*    Cardiac Enzymes Recent Labs   Lab 04/28/18 1505 04/28/18 2055 04/29/18 0218  TROPONINI 0.11* 0.11* 0.11*    Glucose Recent Labs  Lab 04/29/18 0329 04/29/18 0411 04/29/18 0809 04/29/18 1204 04/29/18 1603 04/29/18 1947  GLUCAP 64* 123* 125* 111* 105* 131*    CXR: No new film   ASSESSMENT / PLAN:  PULMONARY A: Respiratory arrest 05/31 Prolonged ventilator dependence / Acute Respiratory failure from Hypoxia Tracheostomy status Persistently high FiO2/PEEP requirements RLL consolidation/PNA P:   Cont vent support -tolerates PCV mode better than PRVC Cont vent bundle Daily SBT if/when meets criteria Continue nebulized bronchodilators Add nebulized steroids 06/19  CARDIOVASCULAR A:  Episode of bradycardia and hypotension secondary to Propofol VT cardiac arrest 05/31 (deemed secondary to respiratory rest) Chronic atrial fibrillation with RVR - rate control improved S/P mechanical aortic valve replacement P:  Precedex D/Ced; will start Dopamine prn MAP goal > 65 mmHg Continue heparin infusion until after G-tube placement Transition to warfarin after all procedures completed Continue enteral metoprolol initiated 6/11 Continue enteral diltiazem, initiated 6/17, dose increased 6/18  RENAL A:   CKD, baseline creatinine 2.0-2.5 Nonoliguric AKI worsened after acute event today Mild hypervolemia, resolved Mild hypernatremia resolved Azotemia P:   Monitor BMET intermittently Monitor I/Os Correct electrolytes as indicated  Free water increased 6/18  GASTROINTESTINAL A:   Abdominal distention, resolved P:   SUP: Enteral famotidine Continue TF protocol Peg tube placement for possible Friday pending GI availability   HEMATOLOGIC A:   Very mild ICU acquired anemia P:  DVT px: heparin infusion (AF, mechanical AVR) Monitor CBC intermittently Transfuse per usual guidelines  Minimize blood draws  INFECTIOUS A:   MRSA pneumonia- repeated cultures negative Severe sepsis,  resolved Pyrexia- cultures negative;Acute Left calf Peroneal DVT P:   Will D/C Linezolid today as patient has now completed 20 days treatment for MRSA pneumonia and repeated cultures are negative Monitor temp, WBC count Micro and abx as above  Patient is on Heparin drip  ENDOCRINE A:   DM 2 with severe insulin resistance/ Hyperglycemia P:   Continue Lantus  Continue SSI, resistance scale  NEUROLOGIC A:   Acute encephalopathy- improving ICU/vent associated discomfort P:   RASS goal: 0, -1 Continue Duragesic patch Continue clonazepam initiated 6/18 Fentanyl infusion discontinued 6/19 Precedex D/Ced   FAMILY: Wife updated at bedside  CCM time:  37 mins The above time includes time spent in consultation with patient and/or family members and reviewing care plan on multidisciplinary rounds  Jackson Latino, MD PCCM service Pager [phone] 04/29/2018 8:13 PM

## 2018-04-29 NOTE — Progress Notes (Signed)
[facility] for heparin drip management Indication: atrial fibrillation and aortic valve replacement   Allergies  Allergen Reactions  . Lexapro [Escitalopram Oxalate] Swelling    Patient Measurements: Height: 5' 10"  (177.8 cm) Weight: (!) 408 lb (185.1 kg) IBW/kg (Calculated) : 73 Heparin Dosing Weight: 121 kg (updated 6/21)  Vital Signs: Temp: 98.8 F (37.1 C) (06/22 0400) Temp Source: Axillary (06/22 0400) BP: 110/77 (06/22 0611) Pulse Rate: 93 (06/22 0500)  Labs: Recent Labs    04/27/18 0509 04/28/18 4650 04/28/18 1505 04/28/18 1754 04/28/18 2055 04/29/18 0218 04/29/18 0527  HGB 12.2* 11.7*  --   --   --   --  11.3*  HCT 37.7* 35.0*  --   --   --   --  34.5*  PLT 205 202  --   --   --   --  198  HEPARINUNFRC 0.55  --   --  0.36  --   --  0.16*  CREATININE 2.57* 2.50* 2.80*  --   --   --  2.67*  TROPONINI  --   --  0.11*  --  0.11* 0.11*  --     Estimated Creatinine Clearance: 55.8 mL/min (A) (by C-G formula based on SCr of 2.67 mg/dL (H)).   Medical History: Past Medical History:  Diagnosis Date  . Allergy   . Anxiety   . Arrhythmia   . Arthritis   . Atrial fibrillation ([place])   . CHF (congestive heart failure) ([place])   . CKD (chronic kidney disease)   . Diabetes mellitus without complication ([place])   . Diabetes mellitus, type II ([place])   . Heart failure ([place])   . Hyperlipidemia   . Hypertension   . Pancreatitis   . Sleep apnea     Medications:  Scheduled:  . budesonide (PULMICORT) nebulizer solution  0.25 mg Nebulization Q6H  . chlorhexidine gluconate (MEDLINE KIT)  15 mL Mouth Rinse BID  . clonazePAM  1 mg Per Tube Daily  . clonazePAM  2 mg Oral QHS  . diltiazem  60 mg Oral Q6H  . famotidine  20 mg Per Tube Daily  . feeding supplement (PRO-STAT SUGAR FREE 64)  30 mL Per Tube BID  . fentaNYL  150 mcg Transdermal Q72H  . free water  200 mL Per Tube Q4H  . insulin aspart  0-20 Units Subcutaneous Q4H  .  insulin glargine  85 Units Subcutaneous BID  . ipratropium-albuterol  3 mL Nebulization Q6H  . mouth rinse  15 mL Mouth Rinse 10 times per day  . metoprolol tartrate  50 mg Per Tube BID  . QUEtiapine  100 mg Per Tube QHS  . sodium chloride flush  10-40 mL Intracatheter Q12H   Infusions:  . dextrose 50 mL/hr at 04/29/18 0500  . DOPamine    . feeding supplement (VITAL HIGH PROTEIN) 30 mL/hr at 04/29/18 0500  . heparin 2,150 Units/hr (04/29/18 3546)    Assessment: Pharmacy consulted for warfarin and heparin drip management for 47 yo male admitted to the ICU s/p cardiac arrest. Patient has history significant for aortic valve replacement, atrial fibrillation and CHF. Head CT is negative for infarction or hemorrhage.   Patient takes warfarin 81m daily as an outpatient for goal INR 2.5-3.5.    Goal of Therapy:  INR 2-3 Heparin level 0.3-0.7 units/ml Monitor platelets by anticoagulation protocol: Yes   Plan:  06/22 @ 0500 HL 0.16 subtherapeutic. Per RN drip was running since being restarted  after PEG placement. Since patient is on a high rate, will omit bolus and just increase rate to 2150 units/hr. Will recheck HL @ 1100, CBC stable.  Tobie Lords, PharmD, BCPS Clinical Pharmacist 04/29/2018

## 2018-04-29 NOTE — Progress Notes (Signed)
Pt noted to have low CBGs this evening, NP Magadeline notified. Orders given to restart tube feeds.

## 2018-04-29 NOTE — Progress Notes (Signed)
[facility] for heparin drip management Indication: atrial fibrillation and aortic valve replacement   Allergies  Allergen Reactions  . Lexapro [Escitalopram Oxalate] Swelling    Patient Measurements: Height: 5' 10"  (177.8 cm) Weight: (!) 408 lb (185.1 kg) IBW/kg (Calculated) : 73 Heparin Dosing Weight: 121 kg (updated 6/21)  Vital Signs: Temp: 98.8 F (37.1 C) (06/22 0400) Temp Source: Axillary (06/22 0400) BP: 156/93 (06/22 1226) Pulse Rate: 109 (06/22 1059)  Labs: Recent Labs    04/27/18 0509 04/28/18 6283 04/28/18 1505 04/28/18 1754 04/28/18 2055 04/29/18 0218 04/29/18 0527 04/29/18 1352  HGB 12.2* 11.7*  --   --   --   --  11.3*  --   HCT 37.7* 35.0*  --   --   --   --  34.5*  --   PLT 205 202  --   --   --   --  198  --   HEPARINUNFRC 0.55  --   --  0.36  --   --  0.16* 0.42  CREATININE 2.57* 2.50* 2.80*  --   --   --  2.67*  --   TROPONINI  --   --  0.11*  --  0.11* 0.11*  --   --     Estimated Creatinine Clearance: 55.8 mL/min (A) (by C-G formula based on SCr of 2.67 mg/dL (H)).   Medical History: Past Medical History:  Diagnosis Date  . Allergy   . Anxiety   . Arrhythmia   . Arthritis   . Atrial fibrillation ([place])   . CHF (congestive heart failure) ([place])   . CKD (chronic kidney disease)   . Diabetes mellitus without complication ([place])   . Diabetes mellitus, type II ([place])   . Heart failure ([place])   . Hyperlipidemia   . Hypertension   . Pancreatitis   . Sleep apnea     Medications:  Scheduled:  . budesonide (PULMICORT) nebulizer solution  0.25 mg Nebulization Q6H  . chlorhexidine gluconate (MEDLINE KIT)  15 mL Mouth Rinse BID  . clonazePAM  1 mg Per Tube Daily  . clonazePAM  2 mg Oral QHS  . diltiazem  60 mg Oral Q6H  . famotidine  20 mg Per Tube Daily  . feeding supplement (PRO-STAT SUGAR FREE 64)  30 mL Per Tube BID  . fentaNYL  150 mcg Transdermal Q72H  . free water  200 mL Per Tube Q4H  . insulin  aspart  0-20 Units Subcutaneous Q4H  . insulin glargine  85 Units Subcutaneous BID  . ipratropium-albuterol  3 mL Nebulization Q6H  . mouth rinse  15 mL Mouth Rinse 10 times per day  . metoprolol tartrate  50 mg Per Tube BID  . QUEtiapine  100 mg Per Tube QHS  . sodium chloride flush  10-40 mL Intracatheter Q12H   Infusions:  . dextrose 50 mL/hr at 04/29/18 0500  . DOPamine    . feeding supplement (VITAL HIGH PROTEIN) 30 mL/hr at 04/29/18 0500  . heparin 2,150 Units/hr (04/29/18 1108)    Assessment: Pharmacy consulted for warfarin and heparin drip management for 47 yo male admitted to the ICU s/p cardiac arrest. Patient has history significant for aortic valve replacement, atrial fibrillation and CHF. Head CT is negative for infarction or hemorrhage.   Patient takes warfarin 77m daily as an outpatient for goal INR 2.5-3.5.    Goal of Therapy:  INR 2-3 Heparin level 0.3-0.7 units/ml Monitor platelets by anticoagulation protocol: Yes  Plan:  06/22 @ 1400 HL 0.42 is therapeutic. Will continue with current rate of 2150 units/hr. Will recheck HL @ 2000 for confirmation, CBC stable.  Paulina Fusi, PharmD, BCPS 04/29/2018 2:44 PM

## 2018-04-30 LAB — HEPARIN LEVEL (UNFRACTIONATED): Heparin Unfractionated: 0.52 IU/mL (ref 0.30–0.70)

## 2018-04-30 LAB — CBC
HEMATOCRIT: 36.5 % — AB (ref 40.0–52.0)
Hemoglobin: 12 g/dL — ABNORMAL LOW (ref 13.0–18.0)
MCH: 27.7 pg (ref 26.0–34.0)
MCHC: 32.8 g/dL (ref 32.0–36.0)
MCV: 84.7 fL (ref 80.0–100.0)
Platelets: 216 10*3/uL (ref 150–440)
RBC: 4.31 MIL/uL — ABNORMAL LOW (ref 4.40–5.90)
RDW: 18.9 % — AB (ref 11.5–14.5)
WBC: 9.6 10*3/uL (ref 3.8–10.6)

## 2018-04-30 LAB — GLUCOSE, CAPILLARY
GLUCOSE-CAPILLARY: 143 mg/dL — AB (ref 65–99)
Glucose-Capillary: 143 mg/dL — ABNORMAL HIGH (ref 65–99)
Glucose-Capillary: 166 mg/dL — ABNORMAL HIGH (ref 65–99)
Glucose-Capillary: 146 mg/dL — ABNORMAL HIGH (ref 65–99)
Glucose-Capillary: 152 mg/dL — ABNORMAL HIGH (ref 65–99)
Glucose-Capillary: 121 mg/dL — ABNORMAL HIGH (ref 65–99)

## 2018-04-30 LAB — CULTURE, RESPIRATORY W GRAM STAIN

## 2018-04-30 LAB — CULTURE, RESPIRATORY

## 2018-04-30 NOTE — Progress Notes (Signed)
[employer] [facility] - [place] at [facility]   PATIENT NAME: Alexander Alexander    MR#:  [id number]  DATE OF BIRTH:  [birth year]  SUBJECTIVE:  CHIEF COMPLAINT:   Chief Complaint  Patient presents with  . Back Pain  very slow improvement, for peg tube, case discussed with intensivist  REVIEW OF SYSTEMS:  CONSTITUTIONAL: No fever, fatigue or weakness.  EYES: No blurred or double vision.  EARS, NOSE, AND THROAT: No tinnitus or ear pain.  RESPIRATORY: No cough, shortness of breath, wheezing or hemoptysis.  CARDIOVASCULAR: No chest pain, orthopnea, edema.  GASTROINTESTINAL: No nausea, vomiting, diarrhea or abdominal pain.  GENITOURINARY: No dysuria, hematuria.  ENDOCRINE: No polyuria, nocturia,  HEMATOLOGY: No anemia, easy bruising or bleeding SKIN: No rash or lesion. MUSCULOSKELETAL: No joint pain or arthritis.   NEUROLOGIC: No tingling, numbness, weakness.  PSYCHIATRY: No anxiety or depression.   ROS  DRUG ALLERGIES:   Allergies  Allergen Reactions  . Lexapro [Escitalopram Oxalate] Swelling    VITALS:  Blood pressure 102/78, pulse (!) 207, temperature 98.8 F (37.1 C), temperature source Axillary, resp. rate 17, height 5\' 10"  (1.778 m), weight (!) 185.1 kg (408 lb), SpO2 94 %.  PHYSICAL EXAMINATION:  GENERAL:  47 y.o.-year-old patient lying in the bed with no acute distress.  EYES: Pupils equal, round, reactive to light and accommodation. No scleral icterus. Extraocular muscles intact.  HEENT: Head atraumatic, normocephalic. Oropharynx and nasopharynx clear.  NECK:  Supple, no jugular venous distention. No thyroid enlargement, no tenderness.  LUNGS: Normal breath sounds bilaterally, no wheezing, rales,rhonchi or crepitation. No use of accessory muscles of respiration.  CARDIOVASCULAR: S1, S2 normal. No murmurs, rubs, or gallops.  ABDOMEN: Soft, nontender, nondistended. Bowel sounds present. No organomegaly or mass.  EXTREMITIES: No pedal edema, cyanosis, or clubbing.   NEUROLOGIC: Cranial nerves II through XII are intact. Muscle strength 5/5 in all extremities. Sensation intact. Gait not checked.  PSYCHIATRIC: The patient is alert and oriented x 3.  SKIN: No obvious rash, lesion, or ulcer.   Physical Exam LABORATORY PANEL:   CBC Recent Labs  Lab 04/30/18 0625  WBC 9.6  HGB 12.0*  HCT 36.5*  PLT 216   ------------------------------------------------------------------------------------------------------------------  Chemistries  Recent Labs  Lab 04/27/18 1049  04/29/18 0527  NA  --    < > 143  K  --    < > 4.4  CL  --    < > 108  CO2  --    < > 27  GLUCOSE  --    < > 114*  BUN  --    < > 77*  CREATININE  --    < > 2.67*  CALCIUM  --    < > 8.4*  AST 27  --   --   ALT 41  --   --   ALKPHOS 68  --   --   BILITOT 1.0  --   --    < > = values in this interval not displayed.   ------------------------------------------------------------------------------------------------------------------  Cardiac Enzymes Recent Labs  Lab 04/28/18 2055 04/29/18 0218  TROPONINI 0.11* 0.11*   ------------------------------------------------------------------------------------------------------------------  RADIOLOGY:  Dg Abd 1 View  Result Date: 04/29/2018 CLINICAL DATA:  47 y/o  M; enteric tube placement. EXAM: ABDOMEN - 1 VIEW COMPARISON:  04/25/2018 abdomen radiograph. FINDINGS: Enteric tube tip projects over the gastric body. Post median sternotomy with broken lower wire. Left upper abdomen bowel gases travel. IMPRESSION: Enteric tube tip projects over gastric body. Electronically Signed  By: Mitzi Hansen M.D.   On: 04/29/2018 16:28   US Venous Img Lower Bilateral  Result Date: 04/28/2018 CLINICAL DATA:  Fever, lower extremity edema EXAM: BILATERAL LOWER EXTREMITY VENOUS DOPPLER ULTRASOUND TECHNIQUE: Gray-scale sonography with graded compression, as well as color Doppler and duplex ultrasound were performed to evaluate the lower  extremity deep venous systems from the level of the common femoral vein and including the common femoral, femoral, profunda femoral, popliteal and calf veins including the posterior tibial, peroneal and gastrocnemius veins when visible. The superficial great saphenous vein was also interrogated. Spectral Doppler was utilized to evaluate flow at rest and with distal augmentation maneuvers in the common femoral, femoral and popliteal veins. COMPARISON:  None. FINDINGS: Limited because of body habitus. RIGHT LOWER EXTREMITY Common Femoral Vein: No evidence of thrombus. Normal compressibility, respiratory phasicity and response to augmentation. Saphenofemoral Junction: No evidence of thrombus. Normal compressibility and flow on color Doppler imaging. Profunda Femoral Vein: No evidence of thrombus. Normal compressibility and flow on color Doppler imaging. Femoral Vein: No evidence of thrombus. Normal compressibility, respiratory phasicity and response to augmentation. Popliteal Vein: No evidence of thrombus. Normal compressibility, respiratory phasicity and response to augmentation. Calf Veins: Limited visualization.  No gross occlusive thrombus. Superficial Great Saphenous Vein: No evidence of thrombus. Normal compressibility. Venous Reflux:  None. Other Findings:  None. LEFT LOWER EXTREMITY Common Femoral Vein: No evidence of thrombus. Normal compressibility, respiratory phasicity and response to augmentation. Saphenofemoral Junction: No evidence of thrombus. Normal compressibility and flow on color Doppler imaging. Profunda Femoral Vein: No evidence of thrombus. Normal compressibility and flow on color Doppler imaging. Femoral Vein: No evidence of thrombus. Normal compressibility, respiratory phasicity and response to augmentation. Popliteal Vein: No evidence of thrombus. Normal compressibility, respiratory phasicity and response to augmentation. Calf Veins: Limited exam. However, the left tibial veins appear patent.  But the left peroneal veins are noncompressible with hypoechoic thrombus appearing occlusive. No propagation into the popliteal or femoral veins. Superficial Great Saphenous Vein: No evidence of thrombus. Normal compressibility. Venous Reflux:  None. Other Findings:  None. IMPRESSION: Limited exam because of body habitus. Positive exam for left calf peroneal DVT without propagation into the femoral or popliteal veins. Very low thrombus burden. No significant right lower extremity DVT. Electronically Signed   By: Judie Petit.  Shick M.D.   On: 04/28/2018 16:02   Dg Chest Port 1 View  Result Date: 04/28/2018 CLINICAL DATA:  Respiratory failure EXAM: PORTABLE CHEST 1 VIEW COMPARISON:  April 27, 2018 FINDINGS: Tracheostomy catheter tip is 5.1 cm above the carina. Nasogastric tube tip and side port are below the diaphragm. No pneumothorax. There is bibasilar atelectatic change. Lungs elsewhere clear. Heart is mildly enlarged with pulmonary vascularity normal. Patient is status post median sternotomy. No evident bone lesions. No appreciable adenopathy. IMPRESSION: Tube positions as described without pneumothorax. Bibasilar atelectasis. Stable cardiac prominence. Electronically Signed   By: Bretta Bang III M.D.   On: 04/28/2018 13:55    ASSESSMENT AND PLAN:  47 year old male with past medical history significant for A. fib status post ablation, aortic valve replacement on warfarin, history of congestive heart failure, CKD, morbid obesity, diabetes and sleep apnea who was getting an outpatient MRI of his lower back had a cardiac arrest.  *Acute cardiopulmonary arrest From respiratory causes Difficult weaning from vent,s/p tracheostomy Continue mechanical ventilation with weaning as tolerated, for Pegtube placement later this week  *AcuteMRSA/VAP Resolved Treated with Linezolid,vent protocol   *Atrial fibrillationwith RVR Stable Continuemetoprolol,Cardizem, amiodarone, continue Heparin  drip.  *History of mechanical aortic valve replacement Stable Onheparin drip  *Acute toxic metabolic encephalopathy  Neurology input appreciated  Continue Kepprafor possible partial seizures  *Chronic diabetes mellitus type 2 Stable on current regiment  *acute renal failurew/ CKD stage III Resolving Secondary toATN  *Acute septic shock, MRSA pneumonia Resolved Plan to complete 14 days linezolid  *F/E/N For PEG tube placementby IRnext week per GI   Disposition to LTAC status post discharge   All the records are reviewed and case discussed with Care Management/Social Workerr. Management plans discussed with the patient, family and they are in agreement.  CODE STATUS: full  TOTAL TIME TAKING CARE OF THIS PATIENT: 35 minutes.     POSSIBLE D/C IN 5 DAYS, DEPENDING ON CLINICAL CONDITION.   Evelena AsaMontell D Salary M.D on 04/30/2018   Between 7am to 6pm - Pager - [phone]  After 6pm go to [email or web address] - password [email or web address]  [facility]  Office  [phone]  CC: Primary care physician; Erasmo DownerBacigalupo, Angela M, MD  Note: This dictation was prepared with Dragon dictation along with smaller phrase technology. Any transcriptional errors that result from this process are unintentional.

## 2018-04-30 NOTE — Progress Notes (Signed)
PULMONARY / CRITICAL CARE MEDICINE   Name: Pricilla RiffleOtis Leon Mumby MRN: [id number] DOB: [birth year]    ADMISSION DATE:  04/07/2018  PT PROFILE:   53M with PMH of atrial fibrillation, status post ablation and aortic valve replacement on long term warfarin, CHF, CKD, diabetes, obstructive sleep apnea and hypertension, suffered a pulseless VT cardiac arrest in MRI when he was being evaluated for back pain and sciatica, subsequently intubated in the intensive care unit. Difficult airway noted  EVENTS/RESULTS: 06/11 CT chest: Dense RLL consolidation.  Segmental RML and LLL atelectasis.  Patchy GGO in upper lobes bilaterally 06/12 rough night with desaturation.  Transient bradycardia.  06/13 More responsive.  Gas exchange improving.  No further bradycardia.  Amiodarone has been stopped. AFRVR (up to 150/min).  Follows commands.  Remains on fentanyl infusion.  Propofol discontinued. 06/17 Gas exchange improving. Rate adequately controlled. Cognition unchanged. Remains on fentanyl infusion. Gastroenterology consultation requested for G tube placement 06/18 Trach tube placement. Post op hypoxemia. Vent changes made 06/19 Transition off of continuous fentanyl. Dexmedetomidine infusion initiated 6/21 Severe symptomatic bradycardia with HR of 29, hypotension and hypoxia after receiving propofol for attempt at G tube placement. Venous doppler- left calf peroneal DVT   INDWELLING DEVICES:: R IJ CVL 05/31 >> 06/10 ETT 05/31 >> 06/18 RUE PICC 06/10 >>  Trach tube 06/18 >>    MICRO DATA: MRSA PCR 05/31 >> NEG Urine 06/01 >> NEG Resp 06/01 >> MRSA Blood 06/01 >> NEG C diff 06/04 >> NEG  Urine 06/05 >> NEG Resp 06/05 >> MRSA Blood 06/05 >> NEG Resp 06/11 >> MRSA C diff 6/17 >> NEG  ANTIMICROBIALS:  Pip-tazo 05/31 >> 06/03 Vanc 05/31 >> 6/12 Cefepime 06/11 >> 06/15 Linezolid 06/12 >>   SUBJECTIVE:  Intermittently agitated.  Intermittently follows commands.  VITAL SIGNS: BP 105/85   Pulse (!) 214    Temp 98 F (36.7 C) (Oral)   Resp 17   Ht 5\' 10"  (1.778 m)   Wt (!) 408 lb (185.1 kg)   SpO2 94%   BMI 58.54 kg/m   HEMODYNAMICS:  Episode of profound bradycardia and hypotension after receiving propofol during attempt at G tube placement.  VENTILATOR SETTINGS: Vent Mode: PCV FiO2 (%):  [40 %] 40 % Set Rate:  [16 bmp] 16 bmp PEEP:  [10 cmH20] 10 cmH20 Plateau Pressure:  [24 cmH20-29 cmH20] 28 cmH20  INTAKE / OUTPUT: I/O last 3 completed shifts: In: 3153.9 [I.V.:1235.2; NG/GT:1918.8] Out: 3045 [Urine:3045]  PHYSICAL EXAMINATION: General: Awake alert and appropriate to all commands Neuro:  eye opening, followed simplecommands HEENT: NCAT, sclerae white Cardiovascular: bradycardia/ tacycardia, no M Lungs: Few rhonchi, no wheezes , trach site ++ secretions Abdomen: NT, ND, NABS, soft, diarrhoea Ext: 1+ LE edema  LABS:  BMET Recent Labs  Lab 04/28/18 0611 04/28/18 1505 04/29/18 0527  NA 145 144 143  K 4.9 4.9 4.4  CL 110 109 108  CO2 27 26 27   BUN 82* 78* 77*  CREATININE 2.50* 2.80* 2.67*  GLUCOSE 170* 109* 114*    Electrolytes Recent Labs  Lab 04/28/18 0611 04/28/18 1505 04/29/18 0527  CALCIUM 8.4* 8.4* 8.4*  PHOS 4.0  --  4.9*    CBC Recent Labs  Lab 04/28/18 0611 04/29/18 0527 04/30/18 0625  WBC 12.2* 11.5* 9.6  HGB 11.7* 11.3* 12.0*  HCT 35.0* 34.5* 36.5*  PLT 202 198 216    Coag's No results for input(s): APTT, INR in the last 168 hours.  Sepsis Markers Recent Labs  Lab 04/27/18  1049  LATICACIDVEN 1.6    ABG Recent Labs  Lab 04/24/18 0412 04/28/18 0411 04/28/18 1327  PHART 7.27* 7.41 7.34*  PCO2ART 77* 46 49*  PO2ART 64* 76* 77*    Liver Enzymes Recent Labs  Lab 04/24/18 0412 04/27/18 1049 04/28/18 0611 04/29/18 0527  AST 34 27  --   --   ALT 84* 41  --   --   ALKPHOS 64 68  --   --   BILITOT 1.0 1.0  --   --   ALBUMIN 2.6* 2.5* 2.6* 2.5*    Cardiac Enzymes Recent Labs  Lab 04/28/18 1505 04/28/18 2055  04/29/18 0218  TROPONINI 0.11* 0.11* 0.11*    Glucose Recent Labs  Lab 04/29/18 1204 04/29/18 1603 04/29/18 1947 04/29/18 2344 04/30/18 0444 04/30/18 0724  GLUCAP 111* 105* 131* 146* 143* 166*    CXR: No new film   ASSESSMENT / PLAN:  PULMONARY A: Respiratory arrest 05/31 Prolonged ventilator dependence / Acute Respiratory failure from Hypoxia Tracheostomy status Persistently high FiO2/PEEP requirements RLL consolidation/MRSA PNA- completed 10 days of ABX but still + secretions ( apyrexial with decreasing WBC) P:   Cont vent support -tolerates PCV mode better than PRVC Cont vent bundle, wean PEEP Daily SBT if/when meets criteria Continue nebulized bronchodilators Add nebulized steroids 06/19  CARDIOVASCULAR A:  Episode of bradycardia and hypotension secondary to Propofol VT cardiac arrest 05/31 (deemed secondary to respiratory rest) Chronic atrial fibrillation with RVR - rate control improved S/P mechanical aortic valve replacement P:  MAP goal > 65 mmHg Continue heparin infusion until after G-tube placement Transition to warfarin after all procedures completed Continue enteral metoprolol initiated 6/11 Continue enteral diltiazem, initiated 6/17, dose increased 6/18  RENAL A:   CKD, baseline creatinine 2.0-2.5 Nonoliguric AKI worsened after acute event today Mild hypervolemia, resolved Mild hypernatremia resolved Azotemia P:   Monitor BMET intermittently Monitor I/Os Correct electrolytes as indicated  Free water increased 6/18  GASTROINTESTINAL A:   Abdominal distention, resolved P:   SUP: Enteral famotidine Continue TF protocol Will consult IR in AM for G tube placement since GI team was unable to do so   HEMATOLOGIC A:   Very mild ICU acquired anemia P:  DVT px: heparin infusion (AF, mechanical AVR) Monitor CBC intermittently Transfuse per usual guidelines  Minimize blood draws  INFECTIOUS A:   MRSA pneumonia- repeated cultures  few GPC Severe sepsis, resolved Pyrexia- cultures negative;Acute Left calf Peroneal DVT P:   Completed 10 days of ABx Monitor temp, WBC count Micro and abx as above  Patient is on Heparin drip  ENDOCRINE A:   DM 2 with severe insulin resistance/ Hyperglycemia P:   Continue Lantus  Continue SSI, resistance scale  NEUROLOGIC A:   Acute encephalopathy- much improving ICU/vent associated discomfort P:   Continue Duragesic patch Continue clonazepam initiated 6/18 Fentanyl infusion discontinued 6/19 Precedex D/Ced   FAMILY: Wife updated at bedside  CCM time:  37 mins The above time includes time spent in consultation with patient and/or family members and reviewing care plan on multidisciplinary rounds  Jackson Latino, MD PCCM service Pager [phone] 04/30/2018 11:39 AM

## 2018-04-30 NOTE — Progress Notes (Signed)
[place] for heparin drip management Indication: atrial fibrillation and aortic valve replacement   Allergies  Allergen Reactions  . Lexapro [Escitalopram Oxalate] Swelling    Patient Measurements: Height: 5' 10"  (177.8 cm) Weight: (unable to weigh due to bed) IBW/kg (Calculated) : 73 Heparin Dosing Weight: 121 kg (updated 6/21)  Vital Signs: Temp: 99.1 F (37.3 C) (06/23 0400) Temp Source: Oral (06/23 0400) BP: 113/75 (06/23 0600) Pulse Rate: 214 (06/23 0600)  Labs: Recent Labs    04/28/18 1761 04/28/18 1505  04/28/18 2055 04/29/18 0218 04/29/18 0527 04/29/18 1352 04/29/18 2039 04/30/18 0625  HGB 11.7*  --   --   --   --  11.3*  --   --  12.0*  HCT 35.0*  --   --   --   --  34.5*  --   --  36.5*  PLT 202  --   --   --   --  198  --   --  216  HEPARINUNFRC  --   --    < >  --   --  0.16* 0.42 0.51 0.52  CREATININE 2.50* 2.80*  --   --   --  2.67*  --   --   --   TROPONINI  --  0.11*  --  0.11* 0.11*  --   --   --   --    < > = values in this interval not displayed.    Estimated Creatinine Clearance: 55.8 mL/min (A) (by C-G formula based on SCr of 2.67 mg/dL (H)).   Medical History: Past Medical History:  Diagnosis Date  . Allergy   . Anxiety   . Arrhythmia   . Arthritis   . Atrial fibrillation ([place])   . CHF (congestive heart failure) ([place])   . CKD (chronic kidney disease)   . Diabetes mellitus without complication ([place])   . Diabetes mellitus, type II ([place])   . Heart failure ([place])   . Hyperlipidemia   . Hypertension   . Pancreatitis   . Sleep apnea     Medications:  Scheduled:  . budesonide (PULMICORT) nebulizer solution  0.25 mg Nebulization Q6H  . chlorhexidine gluconate (MEDLINE KIT)  15 mL Mouth Rinse BID  . clonazePAM  1 mg Per Tube Daily  . clonazePAM  2 mg Oral QHS  . diltiazem  60 mg Oral Q6H  . famotidine  20 mg Per Tube Daily  . feeding supplement (PRO-STAT SUGAR FREE 64)  30 mL Per Tube BID  .  fentaNYL  150 mcg Transdermal Q72H  . free water  200 mL Per Tube Q4H  . insulin aspart  0-20 Units Subcutaneous Q4H  . insulin glargine  85 Units Subcutaneous BID  . ipratropium-albuterol  3 mL Nebulization Q6H  . mouth rinse  15 mL Mouth Rinse 10 times per day  . metoprolol tartrate  50 mg Per Tube BID  . QUEtiapine  100 mg Per Tube QHS  . sodium chloride flush  10-40 mL Intracatheter Q12H   Infusions:  . DOPamine    . feeding supplement (VITAL HIGH PROTEIN) 80 mL/hr at 04/30/18 0419  . heparin 2,150 Units/hr (04/30/18 0029)    Assessment: Pharmacy consulted for warfarin and heparin drip management for 47 yo male admitted to the ICU s/p cardiac arrest. Patient has history significant for aortic valve replacement, atrial fibrillation and CHF. Head CT is negative for infarction or hemorrhage.   Patient takes warfarin 10m daily  as an outpatient for goal INR 2.5-3.5.    Goal of Therapy:  INR 2-3 Heparin level 0.3-0.7 units/ml Monitor platelets by anticoagulation protocol: Yes   Plan:  06/22 @ 1400 HL 0.42 is therapeutic. Will continue with current rate of 2150 units/hr. Will recheck HL @ 2000 for confirmation, CBC stable.  06/22 @ 2100 HL 0.51 is therapeutic. Will continue with current rate of 2150 units/hr. Will recheck HL @ 0500 for confirmation, CBC stable.  06/23 @ 0500 HL 0.52.  Will continue this pt on current rate and recheck HL on 6/24 with AM labs.   Clinical Pharmacist  04/30/2018 7:01 AM

## 2018-05-01 ENCOUNTER — Inpatient Hospital Stay: Payer: Medicare HMO

## 2018-05-01 LAB — RENAL FUNCTION PANEL
ALBUMIN: 2.7 g/dL — AB (ref 3.5–5.0)
Anion gap: 9 (ref 5–15)
BUN: 79 mg/dL — ABNORMAL HIGH (ref 6–20)
CHLORIDE: 107 mmol/L (ref 101–111)
CO2: 24 mmol/L (ref 22–32)
CREATININE: 2.49 mg/dL — AB (ref 0.61–1.24)
Calcium: 8.5 mg/dL — ABNORMAL LOW (ref 8.9–10.3)
GFR calc non Af Amer: 29 mL/min — ABNORMAL LOW (ref >60)
GFR, EST AFRICAN AMERICAN: 33 mL/min — AB (ref >60)
Glucose, Bld: 118 mg/dL — ABNORMAL HIGH (ref 65–99)
PHOSPHORUS: 4.1 mg/dL (ref 2.5–4.6)
Potassium: 4.3 mmol/L (ref 3.5–5.1)
Sodium: 140 mmol/L (ref 135–145)

## 2018-05-01 LAB — CBC
HEMATOCRIT: 37.4 % — AB (ref 40.0–52.0)
Hemoglobin: 12.2 g/dL — ABNORMAL LOW (ref 13.0–18.0)
MCH: 27.5 pg (ref 26.0–34.0)
MCHC: 32.7 g/dL (ref 32.0–36.0)
MCV: 84.2 fL (ref 80.0–100.0)
PLATELETS: 221 10*3/uL (ref 150–440)
RBC: 4.44 MIL/uL (ref 4.40–5.90)
RDW: 19 % — ABNORMAL HIGH (ref 11.5–14.5)
WBC: 11.7 10*3/uL — AB (ref 3.8–10.6)

## 2018-05-01 LAB — GLUCOSE, CAPILLARY
GLUCOSE-CAPILLARY: 101 mg/dL — AB (ref 65–99)
GLUCOSE-CAPILLARY: 133 mg/dL — AB (ref 65–99)
GLUCOSE-CAPILLARY: 129 mg/dL — AB (ref 65–99)
Glucose-Capillary: 118 mg/dL — ABNORMAL HIGH (ref 65–99)
Glucose-Capillary: 65 mg/dL (ref 65–99)
Glucose-Capillary: 65 mg/dL (ref 65–99)
Glucose-Capillary: 114 mg/dL — ABNORMAL HIGH (ref 65–99)

## 2018-05-01 LAB — HEPARIN LEVEL (UNFRACTIONATED): HEPARIN UNFRACTIONATED: 0.42 [IU]/mL (ref 0.30–0.70)

## 2018-05-01 MED ORDER — DEXTROSE 50 % IV SOLN
1.0000 | INTRAVENOUS | Status: AC
  Administered 2018-05-01: 50 mL via INTRAVENOUS

## 2018-05-01 MED ORDER — DEXTROSE 50 % IV SOLN
INTRAVENOUS | Status: AC
  Administered 2018-05-01: 50 mL via INTRAVENOUS
  Filled 2018-05-01: qty 50

## 2018-05-01 MED ORDER — DEXTROSE-NACL 5-0.45 % IV SOLN
INTRAVENOUS | Status: DC
  Administered 2018-05-01: 17:00:00 via INTRAVENOUS

## 2018-05-01 MED ORDER — DEXTROSE-NACL 5-0.45 % IV SOLN: INTRAVENOUS | Status: DC

## 2018-05-01 NOTE — Progress Notes (Signed)
   05/01/18 0739  Vent Select  Invasive or Noninvasive Invasive  Adult Vent Y  Adult Ventilator Settings  Vent Type Servo i  Humidity HME  Vent Mode PRVC  Vt Set 500 mL  Ve 7.3 mL  VC 8  Set Rate 16 bmp  FiO2 (%) 40 %  I Time 0.9 Sec(s)  I:E Ratio Set 1:3  High PEEP 10 sec  Low PEEP 2 sec  High time 2  Adult Ventilator Measurements  Peak Airway Pressure 29 L/min  Mean Airway Pressure 8 cmH20  Plateau Pressure 22 cmH20  Resp Rate Spontaneous 13 br/min  Resp Rate Total 38 br/min  Exhaled Vt 555 mL  Spont TV 318 mL  Measured Ve 12.2 mL  Total PEEP 39 cmH20  SpO2 95 %  Adult Ventilator Alarms  Alarms On Y  Ve High Alarm 20 L/min  Ve Low Alarm 2 L/min  Resp Rate High Alarm 50 br/min  Resp Rate Low Alarm 5  PEEP Low Alarm 2 cmH2O  Press High Alarm 55 cmH2O  T Apnea 20 sec(s)  VAP Prevention  Cuff pressure (initial) 27 cm H2O  HME changed No  Ventilator changed No  Transported while on vent No  HOB> 30 Degrees Y

## 2018-05-01 NOTE — Progress Notes (Signed)
Blood glucose rechecked and is 133.

## 2018-05-01 NOTE — Progress Notes (Signed)
PULMONARY / CRITICAL CARE MEDICINE   Name: Alexander Alexander MRN: [id number] DOB: [birth year]    ADMISSION DATE:  04/07/2018  PT PROFILE:   89M with PMH of atrial fibrillation, status post ablation and aortic valve replacement on long term warfarin, CHF, CKD, diabetes, obstructive sleep apnea and hypertension, suffered a pulseless VT cardiac arrest in MRI when he was being evaluated for back pain and sciatica, subsequently intubated in the intensive care unit. Difficult airway noted  EVENTS/RESULTS: 06/11 CT chest: Dense RLL consolidation.  Segmental RML and LLL atelectasis.  Patchy GGO in upper lobes bilaterally 06/12 rough night with desaturation.  Transient bradycardia.  06/13 More responsive.  Gas exchange improving.  No further bradycardia.  Amiodarone has been stopped. AFRVR (up to 150/min).  Follows commands.  Remains on fentanyl infusion.  Propofol discontinued. 06/17 Gas exchange improving. Rate adequately controlled. Cognition unchanged. Remains on fentanyl infusion. Gastroenterology consultation requested for G tube placement 06/18 Trach tube placement. Post op hypoxemia. Vent changes made 06/19 Transition off of continuous fentanyl. Dexmedetomidine infusion initiated 6/21 Severe symptomatic bradycardia with HR of 29, hypotension and hypoxia after receiving propofol for attempt at G tube placement. Venous doppler- left calf peroneal DVT   INDWELLING DEVICES:: R IJ CVL 05/31 >> 06/10 ETT 05/31 >> 06/18 RUE PICC 06/10 >>  Trach tube 06/18 >>    MICRO DATA: MRSA PCR 05/31 >> NEG Urine 06/01 >> NEG Resp 06/01 >> MRSA Blood 06/01 >> NEG C diff 06/04 >> NEG  Urine 06/05 >> NEG Resp 06/05 >> MRSA Blood 06/05 >> NEG Resp 06/11 >> MRSA C diff 6/17 >> NEG  ANTIMICROBIALS:  Pip-tazo 05/31 >> 06/03 Vanc 05/31 >> 6/12 Cefepime 06/11 >> 06/15 Linezolid 06/12 >>   SUBJECTIVE:  Intermittently agitated.  Intermittently follows commands.  VITAL SIGNS: BP (!) 147/96   Pulse (!)  112   Temp 98.4 F (36.9 C) (Axillary)   Resp 17   Ht 5\' 10"  (1.778 m)   Wt (!) 395 lb (179.2 kg)   SpO2 95%   BMI 56.68 kg/m   HEMODYNAMICS:  Episode of profound bradycardia and hypotension after receiving propofol during attempt at G tube placement.  VENTILATOR SETTINGS: Vent Mode: PRVC FiO2 (%):  [40 %] 40 % Set Rate:  [16 bmp] 16 bmp Vt Set:  [500 mL] 500 mL PEEP:  [5 cmH20] 5 cmH20 Plateau Pressure:  [20 cmH20-22 cmH20] 22 cmH20  INTAKE / OUTPUT: I/O last 3 completed shifts: In: 3550 [I.V.:774; NG/GT:2776] Out: 4670 [Urine:4670]  PHYSICAL EXAMINATION: General: Awake alert and appropriate to all commands Neuro:  eye opening, followed simplecommands HEENT: NCAT, sclerae white Cardiovascular: bradycardia/ tacycardia, no M Lungs: Few rhonchi, no wheezes , trach site less secretions today Abdomen: NT, ND, NABS, soft, diarrhoea Ext: 1+ LE edema  LABS:  BMET Recent Labs  Lab 04/28/18 1505 04/29/18 0527 05/01/18 0356  NA 144 143 140  K 4.9 4.4 4.3  CL 109 108 107  CO2 26 27 24   BUN 78* 77* 79*  CREATININE 2.80* 2.67* 2.49*  GLUCOSE 109* 114* 118*    Electrolytes Recent Labs  Lab 04/28/18 0611 04/28/18 1505 04/29/18 0527 05/01/18 0356  CALCIUM 8.4* 8.4* 8.4* 8.5*  PHOS 4.0  --  4.9* 4.1    CBC Recent Labs  Lab 04/29/18 0527 04/30/18 0625 05/01/18 0356  WBC 11.5* 9.6 11.7*  HGB 11.3* 12.0* 12.2*  HCT 34.5* 36.5* 37.4*  PLT 198 216 221    Coag's No results for input(s): APTT, INR  in the last 168 hours.  Sepsis Markers Recent Labs  Lab 04/27/18 1049  LATICACIDVEN 1.6    ABG Recent Labs  Lab 04/28/18 0411 04/28/18 1327  PHART 7.41 7.34*  PCO2ART 46 49*  PO2ART 76* 77*    Liver Enzymes Recent Labs  Lab 04/27/18 1049 04/28/18 0611 04/29/18 0527 05/01/18 0356  AST 27  --   --   --   ALT 41  --   --   --   ALKPHOS 68  --   --   --   BILITOT 1.0  --   --   --   ALBUMIN 2.5* 2.6* 2.5* 2.7*    Cardiac Enzymes Recent Labs   Lab 04/28/18 1505 04/28/18 2055 04/29/18 0218  TROPONINI 0.11* 0.11* 0.11*    Glucose Recent Labs  Lab 04/30/18 1941 04/30/18 2332 05/01/18 0304 05/01/18 0733 05/01/18 1140 05/01/18 1142  GLUCAP 121* 143* 118* 101* 65 65    CXR: No new film   ASSESSMENT / PLAN:  PULMONARY A: Respiratory arrest 05/31 Prolonged ventilator dependence / Acute Respiratory failure from Hypoxia Tracheostomy status Persistently high FiO2/PEEP requirements RLL consolidation/MRSA PNA- completed 10 days of ABX but still + secretions ( apyrexial with decreasing WBC) P:   Cont vent support -tolerates PCV mode better than PRVC Cont vent bundle, wean PEEP Daily SBT if/when meets criteria Continue nebulized bronchodilators Add nebulized steroids 06/19  CARDIOVASCULAR A:  Episode of bradycardia and hypotension secondary to Propofol VT cardiac arrest 05/31 (deemed secondary to respiratory rest) Chronic atrial fibrillation with RVR - rate control improved S/P mechanical aortic valve replacement P:  MAP goal > 65 mmHg Continue heparin infusion until after G-tube placement Transition to warfarin after all procedures completed Continue enteral metoprolol initiated 6/11 Continue enteral diltiazem, initiated 6/17, dose increased 6/18  RENAL A:   CKD, baseline creatinine 2.0-2.5 Nonoliguric AKI Creatinine trending back downward Mild hypervolemia, resolved Mild hypernatremia resolved Azotemia P:   Monitor BMET intermittently Monitor I/Os Correct electrolytes as indicated  Free water increased 6/18  GASTROINTESTINAL A:   Abdominal distention, resolved P:   SUP: Enteral famotidine Continue TF protocol  IR consulted for G tube placement since GI team was unable to do so   HEMATOLOGIC A:   Very mild ICU acquired anemia P:  DVT px: heparin infusion (AF, mechanical AVR) Monitor CBC intermittently Transfuse per usual guidelines  Minimize blood draws  INFECTIOUS A:   MRSA  pneumonia- repeated cultures few GPC Severe sepsis, resolved Pyrexia- cultures negative;Acute Left calf Peroneal DVT P:   Completed 10 days of ABx Monitor temp, WBC count Micro and abx as above  Patient is on Heparin drip  ENDOCRINE A:   DM 2 with severe insulin resistance/ Hyperglycemia P:   Continue Lantus  Continue SSI, resistance scale  NEUROLOGIC A:   Acute encephalopathy- much improving ICU/vent associated discomfort P:   Continue Duragesic patch Continue clonazepam initiated 6/18 Fentanyl infusion discontinued 6/19 Precedex D/Ced   FAMILY: Wife updated at bedside   The above time includes time spent in consultation with patient and/or family members and reviewing care plan on multidisciplinary rounds  Jackson LatinoKarol Syris [place], MD PCCM service Pager [phone] 05/01/2018 12:00 PM

## 2018-05-01 NOTE — Progress Notes (Signed)
[employer] [facility] [place] [facility]   PATIENT NAME: Alexander Alexander    MR#:  [id number]  DATE OF BIRTH:  1971-07-13  SUBJECTIVE:  CHIEF COMPLAINT:   Chief Complaint  Patient presents with  . Back Pain  Case discussed with intensivist, for PEG tube placement by IR later this week  REVIEW OF SYSTEMS:  CONSTITUTIONAL: No fever, fatigue or weakness.  EYES: No blurred or double vision.  EARS, NOSE, AND THROAT: No tinnitus or ear pain.  RESPIRATORY: No cough, shortness of breath, wheezing or hemoptysis.  CARDIOVASCULAR: No chest pain, orthopnea, edema.  GASTROINTESTINAL: No nausea, vomiting, diarrhea or abdominal pain.  GENITOURINARY: No dysuria, hematuria.  ENDOCRINE: No polyuria, nocturia,  HEMATOLOGY: No anemia, easy bruising or bleeding SKIN: No rash or lesion. MUSCULOSKELETAL: No joint pain or arthritis.   NEUROLOGIC: No tingling, numbness, weakness.  PSYCHIATRY: No anxiety or depression.   ROS  DRUG ALLERGIES:   Allergies  Allergen Reactions  . Lexapro [Escitalopram Oxalate] Swelling    VITALS:  Blood pressure (!) 147/96, pulse (!) 112, temperature 98.4 F (36.9 C), temperature source Axillary, resp. rate 17, height 5\' 10"  (1.778 m), weight (!) 179.2 kg (395 lb), SpO2 96 %.  PHYSICAL EXAMINATION:  GENERAL:  47 y.o.-year-old patient lying in the bed with no acute distress.  EYES: Pupils equal, round, reactive to light and accommodation. No scleral icterus. Extraocular muscles intact.  HEENT: Head atraumatic, normocephalic. Oropharynx and nasopharynx clear.  NECK:  Supple, no jugular venous distention. No thyroid enlargement, no tenderness.  LUNGS: Normal breath sounds bilaterally, no wheezing, rales,rhonchi or crepitation. No use of accessory muscles of respiration.  CARDIOVASCULAR: S1, S2 normal. No murmurs, rubs, or gallops.  ABDOMEN: Soft, nontender, nondistended. Bowel sounds present. No organomegaly or mass.  EXTREMITIES: No pedal edema, cyanosis, or  clubbing.  NEUROLOGIC: Cranial nerves II through XII are intact. Muscle strength 5/5 in all extremities. Sensation intact. Gait not checked.  PSYCHIATRIC: The patient is alert and oriented x 3.  SKIN: No obvious rash, lesion, or ulcer.   Physical Exam LABORATORY PANEL:   CBC Recent Labs  Lab 05/01/18 0356  WBC 11.7*  HGB 12.2*  HCT 37.4*  PLT 221   ------------------------------------------------------------------------------------------------------------------  Chemistries  Recent Labs  Lab 04/27/18 1049  05/01/18 0356  NA  --    < > 140  K  --    < > 4.3  CL  --    < > 107  CO2  --    < > 24  GLUCOSE  --    < > 118*  BUN  --    < > 79*  CREATININE  --    < > 2.49*  CALCIUM  --    < > 8.5*  AST 27  --   --   ALT 41  --   --   ALKPHOS 68  --   --   BILITOT 1.0  --   --    < > = values in this interval not displayed.   ------------------------------------------------------------------------------------------------------------------  Cardiac Enzymes Recent Labs  Lab 04/28/18 2055 04/29/18 0218  TROPONINI 0.11* 0.11*   ------------------------------------------------------------------------------------------------------------------  RADIOLOGY:  Dg Abd 1 View  Result Date: 04/29/2018 CLINICAL DATA:  47 y/o  M; enteric tube placement. EXAM: ABDOMEN - 1 VIEW COMPARISON:  04/25/2018 abdomen radiograph. FINDINGS: Enteric tube tip projects over the gastric body. Post median sternotomy with broken lower wire. Left upper abdomen bowel gases travel. IMPRESSION: Enteric tube tip projects over gastric  body. Electronically Signed   By: Mitzi Hansen M.D.   On: 04/29/2018 16:28    ASSESSMENT AND PLAN:  47 year old male with past medical history significant for A. fib status post ablation, aortic valve replacement on warfarin, history of congestive heart failure, CKD, morbid obesity, diabetes and sleep apnea who was getting an outpatient MRI of his lower back had a  cardiac arrest.  *Acute cardiopulmonary arrest From respiratory causes Difficult weaning from vent,s/p tracheostomy Continuemechanical ventilation with weaning as tolerated, for Pegtube placement later this week  *AcuteMRSA/VAP Resolved Treated withLinezolid,vent protocol   *Atrial fibrillationwith RVR Stable Continuemetoprolol,Cardizem, amiodarone, continue Heparin drip.  *History of mechanical aortic valve replacement Stable Onheparin drip  *Acute toxic metabolic encephalopathy  Neurology input appreciated  Continue Kepprafor possible partial seizures  *Chronic diabetes mellitus type 2 Stable on current regiment  *acute renal failurew/ CKD stage III Resolving Secondary toATN  *Acute septic shock, MRSA pneumonia Resolved Plan to complete 14 days linezolid  *F/E/N ForPEG tube placementby IRnext weekper GI   Disposition to LTAC status post discharge   All the records are reviewed and case discussed with Care Management/Social Workerr. Management plans discussed with the patient, family and they are in agreement.  CODE STATUS: full  TOTAL TIME TAKING CARE OF THIS PATIENT: 35 minutes.     POSSIBLE D/C IN 5 DAYS, DEPENDING ON CLINICAL CONDITION.   Evelena Asa Bellina Tokarczyk M.D on 05/01/2018   Between 7am to 6pm - Pager - [phone]  After 6pm go to [email or web address] - password [email or web address]  [facility]  Office  [phone]  CC: Primary care physician; Erasmo Downer, MD  Note: This dictation was prepared with Dragon dictation along with smaller phrase technology. Any transcriptional errors that result from this process are unintentional.

## 2018-05-01 NOTE — Progress Notes (Signed)
Blood glucose 65.  Lantus held and will give amp of d50 and recheck blood glucose.  Tube feeds were restarted at 1045 because IR is not going to place PEG today.  Patient denies feeling hypoglycemic.

## 2018-05-01 NOTE — Progress Notes (Signed)
[place] for heparin drip management Indication: atrial fibrillation and aortic valve replacement   Allergies  Allergen Reactions  . Lexapro [Escitalopram Oxalate] Swelling    Patient Measurements: Height: 5' 10"  (177.8 cm) Weight: (!) 395 lb (179.2 kg) IBW/kg (Calculated) : 73 Heparin Dosing Weight: 121 kg (updated 6/21)  Vital Signs: Temp: 98.1 F (36.7 C) (06/24 0400) Temp Source: Oral (06/24 0400) BP: 159/115 (06/24 0450) Pulse Rate: 105 (06/24 0430)  Labs: Recent Labs    04/28/18 0100 04/28/18 1505  04/28/18 2055 04/29/18 0218 04/29/18 0527  04/29/18 2039 04/30/18 0625 05/01/18 0356  HGB 11.7*  --   --   --   --  11.3*  --   --  12.0* 12.2*  HCT 35.0*  --   --   --   --  34.5*  --   --  36.5* 37.4*  PLT 202  --   --   --   --  198  --   --  216 221  HEPARINUNFRC  --   --    < >  --   --  0.16*   < > 0.51 0.52 0.42  CREATININE 2.50* 2.80*  --   --   --  2.67*  --   --   --   --   TROPONINI  --  0.11*  --  0.11* 0.11*  --   --   --   --   --    < > = values in this interval not displayed.    Estimated Creatinine Clearance: 54.7 mL/min (A) (by C-G formula based on SCr of 2.67 mg/dL (H)).   Medical History: Past Medical History:  Diagnosis Date  . Allergy   . Anxiety   . Arrhythmia   . Arthritis   . Atrial fibrillation ([place])   . CHF (congestive heart failure) ([place])   . CKD (chronic kidney disease)   . Diabetes mellitus without complication ([place])   . Diabetes mellitus, type II ([place])   . Heart failure ([place])   . Hyperlipidemia   . Hypertension   . Pancreatitis   . Sleep apnea     Medications:  Scheduled:  . budesonide (PULMICORT) nebulizer solution  0.25 mg Nebulization Q6H  . chlorhexidine gluconate (MEDLINE KIT)  15 mL Mouth Rinse BID  . clonazePAM  1 mg Per Tube Daily  . clonazePAM  2 mg Oral QHS  . diltiazem  60 mg Oral Q6H  . famotidine  20 mg Per Tube Daily  . feeding supplement (PRO-STAT SUGAR FREE  64)  30 mL Per Tube BID  . fentaNYL  150 mcg Transdermal Q72H  . free water  200 mL Per Tube Q4H  . insulin aspart  0-20 Units Subcutaneous Q4H  . insulin glargine  85 Units Subcutaneous BID  . ipratropium-albuterol  3 mL Nebulization Q6H  . mouth rinse  15 mL Mouth Rinse 10 times per day  . metoprolol tartrate  50 mg Per Tube BID  . QUEtiapine  100 mg Per Tube QHS  . sodium chloride flush  10-40 mL Intracatheter Q12H   Infusions:  . DOPamine    . feeding supplement (VITAL HIGH PROTEIN) Stopped (04/30/18 2345)  . heparin 2,150 Units/hr (04/30/18 0029)    Assessment: Pharmacy consulted for warfarin and heparin drip management for 47 yo male admitted to the ICU s/p cardiac arrest. Patient has history significant for aortic valve replacement, atrial fibrillation and CHF. Head CT is negative  for infarction or hemorrhage.   Patient takes warfarin 62m daily as an outpatient for goal INR 2.5-3.5.    Goal of Therapy:  INR 2-3 Heparin level 0.3-0.7 units/ml Monitor platelets by anticoagulation protocol: Yes   Plan:  06/24 @ 0400 HL 0.42 therapeutic. Will continue rate at 2150 units/hr and will recheck next anti-Xa w/ am labs. CBC stable.  DTobie Lords PharmD, BCPS Clinical Pharmacist 05/01/2018

## 2018-05-01 NOTE — Progress Notes (Signed)
RN spoke with Dr. Peggye Pittichards and made MD aware that patient has CT ordered and that patient just had 11 beat run of vtach and diastolic blood pressure has been elevated during shift in 90's-low 100's.  Dr. Peggye Pittichards stated that she wants tube feeds to be held for 4 hours prior to patient being layed flat for CT of abdomen and that d5 0.45% saline should be started while tube feeds are held.  MD stated she would also look at patient's home med list regarding patient's blood pressure.

## 2018-05-01 NOTE — Progress Notes (Signed)
Transported pt to CT and back to ICU 1 on vent without incident. Pt remains on vent and tol well at this time.

## 2018-05-01 NOTE — Care Management (Signed)
RT to attempt SBT; PEG delayed for unknown reason. Patient did not tolerate PEG on Friday per MD. [facility] auth pending based on Vent weaning and PEG/feeds.  Irving Burtonmily with [facility] updated.  Wife at bedside.

## 2018-05-02 ENCOUNTER — Other Ambulatory Visit: Payer: Self-pay | Admitting: Family Medicine

## 2018-05-02 ENCOUNTER — Telehealth: Payer: Self-pay

## 2018-05-02 ENCOUNTER — Other Ambulatory Visit: Payer: Self-pay | Admitting: *Deleted

## 2018-05-02 ENCOUNTER — Ambulatory Visit: Payer: Self-pay | Admitting: Family Medicine

## 2018-05-02 LAB — GLUCOSE, CAPILLARY
GLUCOSE-CAPILLARY: 131 mg/dL — AB (ref 70–99)
GLUCOSE-CAPILLARY: 180 mg/dL — AB (ref 70–99)
GLUCOSE-CAPILLARY: 91 mg/dL (ref 70–99)
Glucose-Capillary: 118 mg/dL — ABNORMAL HIGH (ref 70–99)
Glucose-Capillary: 167 mg/dL — ABNORMAL HIGH (ref 70–99)
Glucose-Capillary: 158 mg/dL — ABNORMAL HIGH (ref 70–99)
Glucose-Capillary: 152 mg/dL — ABNORMAL HIGH (ref 70–99)

## 2018-05-02 LAB — RENAL FUNCTION PANEL
ALBUMIN: 2.8 g/dL — AB (ref 3.5–5.0)
Anion gap: 9 (ref 5–15)
BUN: 68 mg/dL — AB (ref 6–20)
CO2: 25 mmol/L (ref 22–32)
CREATININE: 2.46 mg/dL — AB (ref 0.61–1.24)
Calcium: 8.6 mg/dL — ABNORMAL LOW (ref 8.9–10.3)
Chloride: 107 mmol/L (ref 98–111)
GFR calc Af Amer: 34 mL/min — ABNORMAL LOW (ref >60)
GFR, EST NON AFRICAN AMERICAN: 29 mL/min — AB (ref >60)
GLUCOSE: 130 mg/dL — AB (ref 70–99)
PHOSPHORUS: 5.2 mg/dL — AB (ref 2.5–4.6)
Potassium: 4.6 mmol/L (ref 3.5–5.1)
SODIUM: 141 mmol/L (ref 135–145)

## 2018-05-02 LAB — CULTURE, BLOOD (ROUTINE X 2)
Culture: NO GROWTH
Culture: NO GROWTH
SPECIAL REQUESTS: ADEQUATE
Special Requests: ADEQUATE

## 2018-05-02 LAB — HEPARIN LEVEL (UNFRACTIONATED)
HEPARIN UNFRACTIONATED: 0.3 [IU]/mL (ref 0.30–0.70)
HEPARIN UNFRACTIONATED: 0.46 [IU]/mL (ref 0.30–0.70)

## 2018-05-02 MED ORDER — HYDRALAZINE HCL 20 MG/ML IJ SOLN
3.0000 mg | INTRAMUSCULAR | Status: DC | PRN
  Administered 2018-05-03 – 2018-05-05 (×2): 3 mg via INTRAVENOUS
  Filled 2018-05-02 (×2): qty 1

## 2018-05-02 MED ORDER — HEPARIN (PORCINE) IN NACL 100-0.45 UNIT/ML-% IJ SOLN
2150.0000 [IU]/h | INTRAMUSCULAR | Status: AC
Start: 2018-05-02 — End: 2018-05-04
  Administered 2018-05-02 – 2018-05-04 (×3): 2150 [IU]/h via INTRAVENOUS
  Filled 2018-05-02 (×4): qty 250

## 2018-05-02 NOTE — Progress Notes (Signed)
Pt taken off vent and placed on trach collar at 40% FiO2, tolerating well. Wife at bedside, RN notified

## 2018-05-02 NOTE — Progress Notes (Signed)
PULMONARY / CRITICAL CARE MEDICINE   Name: Alexander Alexander MRN: [id number] DOB: September 05, 1971    ADMISSION DATE:  04/07/2018  PT PROFILE:   16M with PMH of atrial fibrillation, status post ablation and aortic valve replacement on long term warfarin, CHF, CKD, diabetes, obstructive sleep apnea and hypertension, suffered a pulseless VT cardiac arrest in MRI when he was being evaluated for back pain and sciatica, subsequently intubated in the intensive care unit. Difficult airway noted  EVENTS/RESULTS: 06/11 CT chest: Dense RLL consolidation.  Segmental RML and LLL atelectasis.  Patchy GGO in upper lobes bilaterally 06/12 rough night with desaturation.  Transient bradycardia.  06/13 More responsive.  Gas exchange improving.  No further bradycardia.  Amiodarone has been stopped. AFRVR (up to 150/min).  Follows commands.  Remains on fentanyl infusion.  Propofol discontinued. 06/17 Gas exchange improving. Rate adequately controlled. Cognition unchanged. Remains on fentanyl infusion. Gastroenterology consultation requested for G tube placement 06/18 Trach tube placement. Post op hypoxemia. Vent changes made 06/19 Transition off of continuous fentanyl. Dexmedetomidine infusion initiated 6/21 Severe symptomatic bradycardia with HR of 29, hypotension and hypoxia after receiving propofol for attempt at G tube placement. Venous doppler- left calf peroneal DVT   INDWELLING DEVICES:: R IJ CVL 05/31 >> 06/10 ETT 05/31 >> 06/18 RUE PICC 06/10 >>  Trach tube 06/18 >>    MICRO DATA: MRSA PCR 05/31 >> NEG Urine 06/01 >> NEG Resp 06/01 >> MRSA Blood 06/01 >> NEG C diff 06/04 >> NEG  Urine 06/05 >> NEG Resp 06/05 >> MRSA Blood 06/05 >> NEG Resp 06/11 >> MRSA C diff 6/17 >> NEG  ANTIMICROBIALS:  Pip-tazo 05/31 >> 06/03 Vanc 05/31 >> 6/12 Cefepime 06/11 >> 06/15 Linezolid 06/12 >>   SUBJECTIVE:  Intermittently agitated.  Intermittently follows commands.  VITAL SIGNS: BP (!) 133/99   Pulse 96    Temp 98.9 F (37.2 C) (Oral)   Resp (!) 26   Ht 5\' 10"  (1.778 m)   Wt (!) 396 lb (179.6 kg)   SpO2 99%   BMI 56.82 kg/m   HEMODYNAMICS:  Episode of profound bradycardia and hypotension after receiving propofol during attempt at G tube placement.  VENTILATOR SETTINGS: Vent Mode: PRVC FiO2 (%):  [40 %] 40 % Set Rate:  [16 bmp] 16 bmp Vt Set:  [500 mL] 500 mL PEEP:  [5 cmH20] 5 cmH20 Pressure Support:  [10 cmH20] 10 cmH20 Plateau Pressure:  [9 cmH20-22 cmH20] 9 cmH20  INTAKE / OUTPUT: I/O last 3 completed shifts: In: 3890.2 [I.V.:862.2; NG/GT:3028] Out: 5925 [Urine:5925]  PHYSICAL EXAMINATION: General: Awake alert and appropriate to all commands Neuro:  eye opening, followed simplecommands HEENT: NCAT, sclerae white Cardiovascular: bradycardia/ tacycardia, no M Lungs: Few rhonchi, no wheezes , trach site less secretions today Abdomen: NT, ND, NABS, soft, diarrhoea Ext: 1+ LE edema  LABS:  BMET Recent Labs  Lab 04/28/18 1505 04/29/18 0527 05/01/18 0356  NA 144 143 140  K 4.9 4.4 4.3  CL 109 108 107  CO2 26 27 24   BUN 78* 77* 79*  CREATININE 2.80* 2.67* 2.49*  GLUCOSE 109* 114* 118*    Electrolytes Recent Labs  Lab 04/28/18 0611 04/28/18 1505 04/29/18 0527 05/01/18 0356  CALCIUM 8.4* 8.4* 8.4* 8.5*  PHOS 4.0  --  4.9* 4.1    CBC Recent Labs  Lab 04/29/18 0527 04/30/18 0625 05/01/18 0356  WBC 11.5* 9.6 11.7*  HGB 11.3* 12.0* 12.2*  HCT 34.5* 36.5* 37.4*  PLT 198 216 221  Coag's No results for input(s): APTT, INR in the last 168 hours.  Sepsis Markers Recent Labs  Lab 04/27/18 1049  LATICACIDVEN 1.6    ABG Recent Labs  Lab 04/28/18 0411 04/28/18 1327  PHART 7.41 7.34*  PCO2ART 46 49*  PO2ART 76* 77*    Liver Enzymes Recent Labs  Lab 04/27/18 1049 04/28/18 0611 04/29/18 0527 05/01/18 0356  AST 27  --   --   --   ALT 41  --   --   --   ALKPHOS 68  --   --   --   BILITOT 1.0  --   --   --   ALBUMIN 2.5* 2.6* 2.5* 2.7*     Cardiac Enzymes Recent Labs  Lab 04/28/18 1505 04/28/18 2055 04/29/18 0218  TROPONINI 0.11* 0.11* 0.11*    Glucose Recent Labs  Lab 05/01/18 1142 05/01/18 1202 05/01/18 1605 05/01/18 1956 05/02/18 0028 05/02/18 0424  GLUCAP 65 133* 129* 114* 91 118*    CXR: No new film   ASSESSMENT / PLAN:  PULMONARY A: Respiratory arrest 05/31 Prolonged ventilator dependence / Acute Respiratory failure from Hypoxia- tolerating vent trials now Tracheostomy status Persistently high FiO2/PEEP requirements RLL consolidation/MRSA PNA- completed 10 days of ABX  P:   Cont vent support -tolerates PCV mode better than PRVC Cont vent bundle, wean PEEP Daily SBT if/when meets criteria Continue nebulized bronchodilators Add nebulized steroids 06/19  CARDIOVASCULAR A:  Episode of bradycardia and hypotension secondary to Propofol VT cardiac arrest 05/31 (deemed secondary to respiratory rest) Chronic atrial fibrillation with RVR - rate control improved S/P mechanical aortic valve replacement P:  MAP goal > 65 mmHg Continue heparin infusion until after G-tube placement Transition to warfarin after all procedures completed Continue enteral metoprolol initiated 6/11 Continue enteral diltiazem, initiated 6/17, dose increased 6/18  RENAL A:   CKD, baseline creatinine 2.0-2.5 Nonoliguric AKI Creatinine trending back downward Mild hypervolemia, resolved Mild hypernatremia resolved Azotemia P:   Monitor BMET intermittently Monitor I/Os Correct electrolytes as indicated  Free water increased 6/18  GASTROINTESTINAL A:   Abdominal distention, resolved P:   SUP: Enteral famotidine Continue TF protocol  IR plans to place Peg tube on thurs 05/04/18   HEMATOLOGIC A:   Very mild ICU acquired anemia P:  DVT px: heparin infusion (AF, mechanical AVR) Monitor CBC intermittently Transfuse per usual guidelines  Minimize blood draws  INFECTIOUS A:   MRSA pneumonia- repeated  cultures few GPC Severe sepsis, resolved Pyrexia- cultures negative;Acute Left calf Peroneal DVT P:   Completed 10 days of ABx Monitor temp, WBC count Micro and abx as above  Patient is on Heparin drip  ENDOCRINE A:   DM 2 with severe insulin resistance/ Hyperglycemia P:   Continue Lantus  Continue SSI, resistance scale  NEUROLOGIC A:   Acute encephalopathy- much improving ICU/vent associated discomfort P:   Continue Duragesic patch Continue clonazepam initiated 6/18 Fentanyl infusion discontinued 6/19 Precedex D/Ced   FAMILY: Wife updated at bedside  Disp : Patient has been denied by his insurance for [employer]  Appeal placed today  Jackson Latino, MD PCCM service Pager [phone] 05/02/2018 5:39 AM

## 2018-05-02 NOTE — Progress Notes (Signed)
Pt tolerated trach collar trial for 1 hour with trach deflated. No shortness of breath noted throughout trial. Pt demonstrated good strong productive cough while on trach collar. Placed back on vent to rest overnight.

## 2018-05-02 NOTE — Telephone Encounter (Signed)
LM for pt to CB. Unable to leave detailed VM due to HIPAA. Was calling to see about getting pt scheduled for an AWV. Need to check and make sure pt has not had an AWV in the last 365 days.  -MM

## 2018-05-02 NOTE — Progress Notes (Signed)
Per specials procedures nurse IR has patient scheduled for PEG tube placement on 05-04-18 at 1430.  Vickie, RN(specials procedure) instructed for patient to be off heparin at 0800 morning of procedure.

## 2018-05-02 NOTE — Progress Notes (Signed)
OT Cancellation Note  Patient Details Name: Alexander RiffleOtis Leon Alexander MRN: [id number] DOB: 01/24/1971   Cancelled Treatment:    Reason Eval/Treat Not Completed: Other (comment). Order received, chart reviewed. PT preparing to evaluate pt. Will re-attempt next date as medically appropriate.   Richrd PrimeJamie Stiller, MPH, MS, OTR/L ascom [phone] 05/02/18, 4:50 PM

## 2018-05-02 NOTE — Progress Notes (Signed)
[place] for heparin drip management Indication: atrial fibrillation and aortic valve replacement   Allergies  Allergen Reactions  . Lexapro [Escitalopram Oxalate] Swelling    Patient Measurements: Height: _0  (177.8 cm) Weight: (!) 396 lb (179.6 kg) IBW/kg (Calculated) : 73 Heparin Dosing Weight: 121 kg (updated 6/21)  Vital Signs: Temp: 99.2 F (37.3 C) (06/25 1230) Temp Source: Oral (06/25 1230) BP: 133/92 (06/25 1700) Pulse Rate: 103 (06/25 1700)  Labs: Recent Labs    04/30/18 0625 05/01/18 0356 05/02/18 0551 05/02/18 1517  HGB 12.0* 12.2*  --   --   HCT 36.5* 37.4*  --   --   PLT 216 221  --   --   HEPARINUNFRC 0.52 0.42 0.30 0.46  CREATININE  --  2.49* 2.46*  --     Estimated Creatinine Clearance: 59.4 mL/min (A) (by C-G formula based on SCr of 2.46 mg/dL (H)).   Medical History: Past Medical History:  Diagnosis Date  . Allergy   . Anxiety   . Arrhythmia   . Arthritis   . Atrial fibrillation ([place])   . CHF (congestive heart failure) ([place])   . CKD (chronic kidney disease)   . Diabetes mellitus without complication ([place])   . Diabetes mellitus, type II ([place])   . Heart failure ([place])   . Hyperlipidemia   . Hypertension   . Pancreatitis   . Sleep apnea     Medications:  Scheduled:  . budesonide (PULMICORT) nebulizer solution  0.25 mg Nebulization Q6H  . chlorhexidine gluconate (MEDLINE KIT)  15 mL Mouth Rinse BID  . clonazePAM  1 mg Per Tube Daily  . clonazePAM  2 mg Oral QHS  . diltiazem  60 mg Oral Q6H  . famotidine  20 mg Per Tube Daily  . feeding supplement (PRO-STAT SUGAR FREE 64)  30 mL Per Tube BID  . fentaNYL  150 mcg Transdermal Q72H  . free water  200 mL Per Tube Q4H  . insulin aspart  0-20 Units Subcutaneous Q4H  . insulin glargine  85 Units Subcutaneous BID  . ipratropium-albuterol  3 mL Nebulization Q6H  . mouth rinse  15 mL Mouth Rinse 10 times per day  . metoprolol tartrate  50 mg Per  Tube BID  . QUEtiapine  100 mg Per Tube QHS  . sodium chloride flush  10-40 mL Intracatheter Q12H   Infusions:  . dextrose 5 % and 0.45% NaCl Stopped (05/02/18 0900)  . feeding supplement (VITAL HIGH PROTEIN) 80 mL/hr at 05/02/18 0630  . heparin 2,150 Units/hr (05/02/18 1652)    Assessment: Pharmacy consulted for warfarin and heparin drip management for 48 yo male admitted to the ICU s/p cardiac arrest. Patient has history significant for aortic valve replacement, atrial fibrillation and CHF. Head CT is negative for infarction or hemorrhage.   Patient takes warfarin 35m daily as an outpatient for goal INR 2.5-3.5.    Goal of Therapy:  INR 2-3 Heparin level 0.3-0.7 units/ml Monitor platelets by anticoagulation protocol: Yes   Plan:  Will continue rate at 2150 units/hr and will recheck next anti-Xa w/ am labs.   Pharmacy will continue to monitor and adjust per consult.   MLS 05/02/2018

## 2018-05-02 NOTE — Progress Notes (Signed)
   05/02/18 0844  Tracheostomy Shiley 7 mm Cuffed;Proximal  Placement Date/Time: 04/25/18 0813   Placed By: (c) Other (Comment)  Brand: Shiley  Size (mm): 7 mm  Style: Cuffed;Proximal  Serial / Lot #: [id number]  Expiration Date: 07/09/20  Status Secured  Site Assessment Dry;Clean  Site Care Dried;Cleansed  Inner Cannula Care Changed/new  Ties Assessment Clean  Cuff pressure (cm) 28 cm  Tracheostomy Equipment at bedside Yes and checklist posted at head of bed  Adult Ventilator Settings  Vent Type Servo i  Humidity HME  Vent Mode Spontaneous  FiO2 (%) 40 %  Pressure Support 10 cmH20  PEEP 5 cmH20  Adult Ventilator Measurements  Peak Airway Pressure 17 L/min  Mean Airway Pressure 8 cmH20  Resp Rate Spontaneous 25 br/min  Spont TV 597 mL  Measured Ve 14 mL  End Tidal CO2 42  Adult Ventilator Alarms  Alarms On Y  Ve High Alarm 20 L/min  Ve Low Alarm 2 L/min  Resp Rate High Alarm 50 br/min  Resp Rate Low Alarm 5  PEEP Low Alarm 2 cmH2O  Press High Alarm 55 cmH2O  T Apnea 20 sec(s)  VAP Prevention  Cuff pressure (initial) 28 cm H2O  HME changed Yes  HOB> 30 Degrees Y  Daily Weaning Assessment  Daily Assessment of Readiness to Wean Wean protocol criteria met (SBT performed)  SBT Method  (PS 10, PEEP 5)  Airway Suctioning/Secretions  Suction Type Tracheal  Suction Device  Catheter  Secretion Amount Small  Secretion Color Tan  Secretion Consistency Thin  Suction Tolerance Tolerated well

## 2018-05-02 NOTE — Progress Notes (Signed)
PT Cancellation Note  Patient Details Name: Alexander Alexander MRN: [id number] DOB: 1971/08/13   Cancelled Treatment:     Order received, chart reviewed. PT attempted eval this afternoon. Pt recently completed bath with nursing as well as currently is on trach collar opposed to vent. Pt is very fatigued and will not open his eyes to commands from PT. PT will attempt on next date pt is medially appropriate.   Lyndal Alamillo 05/02/2018, 5:11 PM

## 2018-05-02 NOTE — Progress Notes (Signed)
Patient ID: Alexander RiffleOtis Leon Alexander, male   DOB: Aug 30, 1971, 47 y.o.   MRN: [id number] Interventional Radiology has been consulted for a percutaneous gastrostomy tube placement.  GI was unable to transilluminate the stomach during endoscopy and likely due to large left hepatic lobe and body habitus. I reviewed the recent abdominal CT.  Patient's anatomy is challenging due to morbid obesity and large left hepatic lobe but there may be a percutaneous window with the stomach distended and the colon opacified with contrast.  I still need to speak with patient's wife for informed consent but we are planning to perform gastrostomy tube placement this Thursday.  We will arrange for barium to be given through NGT tomorrow and will hold heparin prior to procedure.

## 2018-05-02 NOTE — Progress Notes (Signed)
[facility] [place] [facility]   PATIENT NAME: Alexander HeadyOtis Alexander    MR#:  [id number]  DATE OF BIRTH:  1971-09-07  SUBJECTIVE:  CHIEF COMPLAINT:   Chief Complaint  Patient presents with  . Back Pain  In discussion with intensivist, plans for PEG tube placement by IR on Thursday  REVIEW OF SYSTEMS:  CONSTITUTIONAL: No fever, fatigue or weakness.  EYES: No blurred or double vision.  EARS, NOSE, AND THROAT: No tinnitus or ear pain.  RESPIRATORY: No cough, shortness of breath, wheezing or hemoptysis.  CARDIOVASCULAR: No chest pain, orthopnea, edema.  GASTROINTESTINAL: No nausea, vomiting, diarrhea or abdominal pain.  GENITOURINARY: No dysuria, hematuria.  ENDOCRINE: No polyuria, nocturia,  HEMATOLOGY: No anemia, easy bruising or bleeding SKIN: No rash or lesion. MUSCULOSKELETAL: No joint pain or arthritis.   NEUROLOGIC: No tingling, numbness, weakness.  PSYCHIATRY: No anxiety or depression.   ROS  DRUG ALLERGIES:   Allergies  Allergen Reactions  . Lexapro [Escitalopram Oxalate] Swelling    VITALS:  Blood pressure (!) 138/112, pulse (!) 196, temperature 99.2 F (37.3 C), temperature source Oral, resp. rate (!) 25, height 5\' 10"  (1.778 m), weight (!) 179.6 kg (396 lb), SpO2 93 %.  PHYSICAL EXAMINATION:  GENERAL:  47 y.o.-year-old patient lying in the bed with no acute distress.  EYES: Pupils equal, round, reactive to light and accommodation. No scleral icterus. Extraocular muscles intact.  HEENT: Head atraumatic, normocephalic. Oropharynx and nasopharynx clear.  NECK:  Supple, no jugular venous distention. No thyroid enlargement, no tenderness.  LUNGS: Normal breath sounds bilaterally, no wheezing, rales,rhonchi or crepitation. No use of accessory muscles of respiration.  CARDIOVASCULAR: S1, S2 normal. No murmurs, rubs, or gallops.  ABDOMEN: Soft, nontender, nondistended. Bowel sounds present. No organomegaly or mass.  EXTREMITIES: No pedal edema, cyanosis,  or clubbing.  NEUROLOGIC: Cranial nerves II through XII are intact. Muscle strength 5/5 in all extremities. Sensation intact. Gait not checked.  PSYCHIATRIC: The patient is alert and oriented x 3.  SKIN: No obvious rash, lesion, or ulcer.   Physical Exam LABORATORY PANEL:   CBC Recent Labs  Lab 05/01/18 0356  WBC 11.7*  HGB 12.2*  HCT 37.4*  PLT 221   ------------------------------------------------------------------------------------------------------------------  Chemistries  Recent Labs  Lab 04/27/18 1049  05/02/18 0551  NA  --    < > 141  K  --    < > 4.6  CL  --    < > 107  CO2  --    < > 25  GLUCOSE  --    < > 130*  BUN  --    < > 68*  CREATININE  --    < > 2.46*  CALCIUM  --    < > 8.6*  AST 27  --   --   ALT 41  --   --   ALKPHOS 68  --   --   BILITOT 1.0  --   --    < > = values in this interval not displayed.   ------------------------------------------------------------------------------------------------------------------  Cardiac Enzymes Recent Labs  Lab 04/28/18 2055 04/29/18 0218  TROPONINI 0.11* 0.11*   ------------------------------------------------------------------------------------------------------------------  RADIOLOGY:  Ct Abdomen Wo Contrast  Result Date: 05/01/2018 CLINICAL DATA:  47 y/o  M; evaluate for PEG tube placement. EXAM: CT ABDOMEN WITHOUT CONTRAST TECHNIQUE: Multidetector CT imaging of the abdomen was performed following the standard protocol without IV contrast. COMPARISON:  None. FINDINGS: Lower chest: Cardiomegaly, platelike atelectasis in the lung bases, mitral annular  calcification. Hepatobiliary: No focal liver abnormality is seen. No gallstones, gallbladder wall thickening, or biliary dilatation. Pancreas: Unremarkable. No pancreatic ductal dilatation or surrounding inflammatory changes. Spleen: Normal in size without focal abnormality. Adrenals/Urinary Tract: Adrenal glands are unremarkable. Kidneys are normal, without  renal calculi, focal lesion, or hydronephrosis. Stomach/Bowel: Stomach is within normal limits. Appendix appears normal. No evidence of bowel wall thickening, distention, or inflammatory changes. The left lobe of the liver extends inferiorly overlapping the majority of the gastric body. There is no colon super position over the stomach at this time. There is a very small area of uncovered stomach in the left anterior axillary line below the ribcage (series 2, image 32). Vascular/Lymphatic: No significant vascular findings are present. No enlarged abdominal or pelvic lymph nodes. Other: No abdominal wall hernia or abnormality. Musculoskeletal: No acute or significant osseous findings. IMPRESSION: The left lobe of the liver extends inferiorly overlapping the majority of the gastric body. There is no colon superficial to the stomach at this time. There is a very small area of uncovered stomach in the left anterior axillary line below the ribcage. Electronically Signed   By: Mitzi Hansen M.D.   On: 05/01/2018 23:14    ASSESSMENT AND PLAN:  47 year old male with past medical history significant for A. fib status post ablation, aortic valve replacement on warfarin, history of congestive heart failure, CKD, morbid obesity, diabetes and sleep apnea who was getting an outpatient MRI of his lower back had a cardiac arrest.  *Acute cardiopulmonary arrest From respiratory causes Difficult weaning from vent,s/p tracheostomy Continuemechanical ventilation with weaning as tolerated, for Pegtube placement later this week  *AcuteMRSA/VAP Resolved Treated withcourse of Linezolid Continue trach collar/vent protocol   *Atrial fibrillationwith RVR Stable Continuemetoprolol,Cardizem, amiodarone, continue Heparin drip.  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 Management plans discussed with the patient, family and they are in agreement.  CODE STATUS: full  TOTAL TIME TAKING CARE OF THIS PATIENT: .     POSSIBLE D/C IN 3-5 DAYS, DEPENDING ON CLINICAL CONDITION.   Evelena Asa Meleane Selinger M.D on 05/02/2018   Between 7am to 6pm - Pager - [phone]  After 6pm go to [email or web address] - password [email or web address]  [facility]  Office  [phone]  CC: Primary care physician; Erasmo Downer, MD  Note: This dictation was prepared with Dragon dictation along with smaller phrase technology. Any transcriptional errors that result from this process are unintentional.

## 2018-05-03 ENCOUNTER — Other Ambulatory Visit: Payer: Self-pay | Admitting: Student

## 2018-05-03 DIAGNOSIS — I5042 Chronic combined systolic (congestive) and diastolic (congestive) heart failure: Secondary | ICD-10-CM

## 2018-05-03 DIAGNOSIS — Z952 Presence of prosthetic heart valve: Secondary | ICD-10-CM

## 2018-05-03 LAB — COMPREHENSIVE METABOLIC PANEL
ALT: 20 U/L (ref 0–44)
ANION GAP: 9 (ref 5–15)
AST: 27 U/L (ref 15–41)
Albumin: 2.7 g/dL — ABNORMAL LOW (ref 3.5–5.0)
Alkaline Phosphatase: 66 U/L (ref 38–126)
BUN: 68 mg/dL — AB (ref 6–20)
CHLORIDE: 103 mmol/L (ref 98–111)
CO2: 26 mmol/L (ref 22–32)
Calcium: 8.6 mg/dL — ABNORMAL LOW (ref 8.9–10.3)
Creatinine, Ser: 2.63 mg/dL — ABNORMAL HIGH (ref 0.61–1.24)
GFR calc Af Amer: 31 mL/min — ABNORMAL LOW (ref >60)
GFR, EST NON AFRICAN AMERICAN: 27 mL/min — AB (ref >60)
Glucose, Bld: 197 mg/dL — ABNORMAL HIGH (ref 70–99)
POTASSIUM: 5 mmol/L (ref 3.5–5.1)
Sodium: 138 mmol/L (ref 135–145)
Total Bilirubin: 0.9 mg/dL (ref 0.3–1.2)
Total Protein: 7.9 g/dL (ref 6.5–8.1)

## 2018-05-03 LAB — GLUCOSE, CAPILLARY
GLUCOSE-CAPILLARY: 189 mg/dL — AB (ref 70–99)
GLUCOSE-CAPILLARY: 194 mg/dL — AB (ref 70–99)
GLUCOSE-CAPILLARY: 162 mg/dL — AB (ref 70–99)
GLUCOSE-CAPILLARY: 150 mg/dL — AB (ref 70–99)
Glucose-Capillary: 138 mg/dL — ABNORMAL HIGH (ref 70–99)

## 2018-05-03 LAB — CBC WITH DIFFERENTIAL/PLATELET
BASOS ABS: 0 10*3/uL (ref 0–0.1)
Basophils Relative: 0 %
EOS ABS: 0.2 10*3/uL (ref 0–0.7)
Eosinophils Relative: 2 %
HCT: 34.6 % — ABNORMAL LOW (ref 40.0–52.0)
Hemoglobin: 11.4 g/dL — ABNORMAL LOW (ref 13.0–18.0)
Lymphocytes Relative: 24 %
Lymphs Abs: 2.3 10*3/uL (ref 1.0–3.6)
MCH: 28.1 pg (ref 26.0–34.0)
MCHC: 33.1 g/dL (ref 32.0–36.0)
MCV: 85.1 fL (ref 80.0–100.0)
MONO ABS: 1.4 10*3/uL — AB (ref 0.2–1.0)
MONOS PCT: 15 %
NEUTROS ABS: 5.6 10*3/uL (ref 1.4–6.5)
NEUTROS PCT: 59 %
PLATELETS: 231 10*3/uL (ref 150–440)
RBC: 4.07 MIL/uL — ABNORMAL LOW (ref 4.40–5.90)
RDW: 19.3 % — AB (ref 11.5–14.5)
WBC: 9.5 10*3/uL (ref 3.8–10.6)

## 2018-05-03 LAB — HEPARIN LEVEL (UNFRACTIONATED): HEPARIN UNFRACTIONATED: 0.48 [IU]/mL (ref 0.30–0.70)

## 2018-05-03 MED ORDER — FREE WATER
250.0000 mL | Freq: Four times a day (QID) | Status: DC
  Administered 2018-05-03 – 2018-05-19 (×49): 250 mL

## 2018-05-03 MED ORDER — GLUCERNA 1.5 CAL PO LIQD
1000.0000 mL | ORAL | Status: AC
  Administered 2018-05-03: 1000 mL

## 2018-05-03 MED ORDER — ORAL CARE MOUTH RINSE
15.0000 mL | Freq: Two times a day (BID) | OROMUCOSAL | Status: DC
  Administered 2018-05-03 – 2018-05-05 (×4): 15 mL via OROMUCOSAL

## 2018-05-03 MED ORDER — BARIUM SULFATE 2.1 % PO SUSP
450.0000 mL | Freq: Once | ORAL | Status: AC
  Administered 2018-05-03: 450 mL via NASOGASTRIC

## 2018-05-03 MED ORDER — DILTIAZEM HCL 60 MG PO TABS
ORAL_TABLET | ORAL | Status: AC
  Administered 2018-05-03: 60 mg via ORAL
  Filled 2018-05-03: qty 1

## 2018-05-03 NOTE — Progress Notes (Signed)
ANTICOAGULATION CONSULT NOTE   Pharmacy Consult for heparin drip management Indication: atrial fibrillation and aortic valve replacement   Allergies  Allergen Reactions  . Lexapro [Escitalopram Oxalate] Swelling    Patient Measurements: Height: 5\' 10"  (177.8 cm) Weight: (!) 395 lb (179.2 kg) IBW/kg (Calculated) : 73 Heparin Dosing Weight: 121 kg (updated 6/21)  Vital Signs: Temp: 98.4 F (36.9 C) (06/26 1200) Temp Source: Oral (06/26 1200) BP: 119/95 (06/26 1204) Pulse Rate: 101 (06/26 1200)  Labs: Recent Labs    05/01/18 0356 05/02/18 0551 05/02/18 1517 05/03/18 0424  HGB 12.2*  --   --  11.4*  HCT 37.4*  --   --  34.6*  PLT 221  --   --  231  HEPARINUNFRC 0.42 0.30 0.46 0.48  CREATININE 2.49* 2.46*  --  2.63*    Estimated Creatinine Clearance: 55.5 mL/min (A) (by C-G formula based on SCr of 2.63 mg/dL (H)).   Medical History: Past Medical History:  Diagnosis Date  . Allergy   . Anxiety   . Arrhythmia   . Arthritis   . Atrial fibrillation (HCC)   . CHF (congestive heart failure) (HCC)   . CKD (chronic kidney disease)   . Diabetes mellitus without complication (HCC)   . Diabetes mellitus, type II (HCC)   . Heart failure (HCC)   . Hyperlipidemia   . Hypertension   . Pancreatitis   . Sleep apnea     Medications:  Scheduled:  . budesonide (PULMICORT) nebulizer solution  0.25 mg Nebulization Q6H  . clonazePAM  1 mg Per Tube Daily  . clonazePAM  2 mg Oral QHS  . diltiazem  60 mg Oral Q6H  . famotidine  20 mg Per Tube Daily  . fentaNYL  150 mcg Transdermal Q72H  . free water  250 mL Per Tube Q6H  . insulin aspart  0-20 Units Subcutaneous Q4H  . insulin glargine  85 Units Subcutaneous BID  . ipratropium-albuterol  3 mL Nebulization Q6H  . mouth rinse  15 mL Mouth Rinse BID  . metoprolol tartrate  50 mg Per Tube BID  . QUEtiapine  100 mg Per Tube QHS  . sodium chloride flush  10-40 mL Intracatheter Q12H   Infusions:  . feeding supplement (GLUCERNA  1.5 CAL)    . heparin 2,150 Units/hr (05/03/18 1200)    Assessment: Pharmacy consulted for warfarin and heparin drip management for 47 yo male admitted to the ICU s/p cardiac arrest. Patient has history significant for aortic valve replacement, atrial fibrillation and CHF. Head CT is negative for infarction or hemorrhage.   Patient takes warfarin 5mg  daily as an outpatient for goal INR 2.5-3.5.    Goal of Therapy:  INR 2-3 Heparin level 0.3-0.7 units/ml Monitor platelets by anticoagulation protocol: Yes   Plan:  Patient is to have PEG tube placement by IR on 6/27. Per IR instructions will hold heparin drip starting at 0600 on 6/27.   Pharmacy will continue to monitor and adjust per consult.    MLS 05/03/2018

## 2018-05-03 NOTE — Evaluation (Signed)
Physical Therapy Evaluation Patient Details Name: Alexander Alexander MRN: [id number] DOB: [birth year] Today's Date: 05/03/2018   History of Present Illness  Pt. is a 47 y.o. male with PMH of atrial fibrillation, status post ablation and aortic valve replacement on long term warfarin, CHF, CKD, diabetes, obstructive sleep apnea and hypertension. Pt. had a pulseless VT cardiac arrest in MRI when he was being evaluated for back pain and sciatica, subsequently intubated in the intensive care unit. Intubated from 5/31-6/18 when trach placed. Pt for PEG placement tomorrow potentially. Of note, pt also with L LE DVT currently on heparin drip diagnosed on 6/21.   Clinical Impression  Pt is a pleasant 47 year old male who was admitted for cardiac arrest while in MRI and now with prolonged CCU stay secondary to intubation and trach placement. Upon eval, pt currently on trach collar, however has increased work of breathing with increased RR with any there-ex. Pt awake, however only follows commands about 50% of the time. Needs heavy encouragement to participate from family in room. Spontaneously moves L UE/LE, limited movement of R UE/LE, unsure of bed positioning. Unable to perform formalized mobility efforts due to cognition. May be appropriate for tilt bed to progress mobility efforts. Discussed with RN to place order for bed. Discussed with family in room. Pt demonstrates deficits with weakness/mobility/respiratory status. Would benefit from skilled PT to address above deficits and promote optimal return to PLOF. Currently recommending LTACH placement.     Follow Up Recommendations LTACH    Equipment Recommendations  (TBD at next venue of care)    Recommendations for Other Services       Precautions / Restrictions Precautions Precautions: Fall Restrictions Weight Bearing Restrictions: No      Mobility  Bed Mobility               General bed mobility comments: unable to test due to work of  breathing  Transfers                 General transfer comment: unsafe  Ambulation/Gait             General Gait Details: unable  Stairs            Wheelchair Mobility    Modified Rankin (Stroke Patients Only)       Balance Overall balance assessment: History of Falls                                           Pertinent Vitals/Pain Pain Assessment: No/denies pain Pain Score: 0-No pain    Home Living Family/patient expects to be discharged to:: Private residence Living Arrangements: Spouse/significant other Available Help at Discharge: Family Type of Home: (townhouse) Home Access: Level entry     Home Layout: Two level Home Equipment: Cane - single point Additional Comments: All history obtained from wife as pt unable to speak due to trach    Prior Function Level of Independence: Independent with assistive device(s)         Comments: Per wife, pt was previously independent with all ADLs, however used SPC for long distances     Hand Dominance   Dominant Hand: Right    Extremity/Trunk Assessment   Upper Extremity Assessment Upper Extremity Assessment: Generalized weakness;Difficult to assess due to impaired cognition(Able to grip 3/5 B UE; R UE grossly 3/5 shoulder flex. )    Lower  Extremity Assessment Lower Extremity Assessment: Difficult to assess due to impaired cognition;Generalized weakness(didn't move R LE, L LE at least 3/5)       Communication   Communication: Tracheostomy  Cognition Arousal/Alertness: Awake/alert Behavior During Therapy: Anxious Overall Cognitive Status: Difficult to assess                                        General Comments      Exercises Other Exercises Other Exercises: Attempted supine ther-ex, however pt only able to follow grossly 50% of commands. Noted spontaneous movement of L side, limited movement noted on R side due to bed positioning. Able to do 1 rep of  ther-ex inconsistently and then has increased work of breathing and coughing noted.   Assessment/Plan    PT Assessment Patient needs continued PT services  PT Problem List Decreased strength;Decreased activity tolerance;Decreased balance;Decreased mobility;Decreased cognition;Cardiopulmonary status limiting activity;Obesity       PT Treatment Interventions Gait training;DME instruction;Therapeutic exercise;Balance training;Patient/family education    PT Goals (Current goals can be found in the Care Plan section)  Acute Rehab PT Goals Patient Stated Goal: To return home PT Goal Formulation: Patient unable to participate in goal setting Time For Goal Achievement: 05/17/18 Potential to Achieve Goals: Fair    Frequency Min 2X/week   Barriers to discharge Inaccessible home environment;Decreased caregiver support      Co-evaluation               AM-PAC PT "6 Clicks" Daily Activity  Outcome Measure Difficulty turning over in bed (including adjusting bedclothes, sheets and blankets)?: Unable Difficulty moving from lying on back to sitting on the side of the bed? : Unable Difficulty sitting down on and standing up from a chair with arms (e.g., wheelchair, bedside commode, etc,.)?: Unable Help needed moving to and from a bed to chair (including a wheelchair)?: Total Help needed walking in hospital room?: Total Help needed climbing 3-5 steps with a railing? : Total 6 Click Score: 6    End of Session Equipment Utilized During Treatment: (trach) Activity Tolerance: No increased pain Patient left: in bed;with family/visitor present Nurse Communication: Mobility status(recommending lift bed) PT Visit Diagnosis: Muscle weakness (generalized) (M62.81);Difficulty in walking, not elsewhere classified (R26.2);Other symptoms and signs involving the nervous system (R29.898)    Time: 1610-9604 PT Time Calculation (min) (ACUTE ONLY): 20 min   Charges:   PT Evaluation $PT Eval High  Complexity: 1 High     PT G CodesElizabeth Alexander, PT, DPT [phone]   Alexander Alexander 05/03/2018, 11:41 AM

## 2018-05-03 NOTE — Progress Notes (Addendum)
Pt was placed on trach collar for , on 10L at 40%. Pt tolerated well initially but his respiratory rate increased to the 40's and was placed back on PRVC ventiltion.

## 2018-05-03 NOTE — Care Management (Signed)
Per progression rounds today, PEG planned for tomorrow in IR. RNCM has updated Alexander Alexander with [facility].LTAC was denied by his insurance; Appeal sent 05/02/18 per Dr. Peggye Pittichards.

## 2018-05-03 NOTE — Evaluation (Signed)
Occupational Therapy Evaluation Patient Details Name: Alexander Alexander MRN: [id number] DOB: May 13, 1971 Today's Date: 05/03/2018    History of Present Illness (P) Pt. is a 47 y.o. male with PMH of atrial fibrillation, status post ablation and aortic valve replacement on long term warfarin, CHF, CKD, diabetes, obstructive sleep apnea and hypertension. Pt. had a pulseless VT cardiac arrest in MRI when he was being evaluated for back pain and sciatica, subsequently intubated in the intensive care unit.    Clinical Impression   Pt. presents with weakness, limited activity tolerance, and limited functional mobility which limits the ability to complete basic ADL functioning. Pt. resides at home with his wife. Pt. was independent with ADLs, and IADL functioning per wife report. Pt.,  and wife education was provided about OT services. Pt. with impaired activity tolerance. Pt. BP: 121/76, HR: 101, an SO2: 96%. Pt. Could benefit from OT services for ADL training, A/E training, and pt. Education about  Energy conservation/work simplification techniques during ADLs, home modification, and DME. Pt. Could beneift from follow-up OT services upon discharge.    Follow Up Recommendations  LTACH    Equipment Recommendations  Tub/shower seat;Tub/shower bench;3 in 1 bedside commode    Recommendations for Other Services       Precautions / Restrictions Precautions Precautions: (P) Fall Restrictions Weight Bearing Restrictions: (P) No      Mobility Bed Mobility    Deferred               Transfers      Deferred                Balance                                           ADL either performed or assessed with clinical judgement   ADL Overall ADL's : Needs assistance/impaired Eating/Feeding: NPO   Grooming: Maximal assistance   Upper Body Bathing: Total assistance   Lower Body Bathing: Total assistance   Upper Body Dressing : Total assistance;Bed level    Lower Body Dressing: Total assistance;Bed level   Toilet Transfer: Total assistance           Functional mobility during ADLs: Total assistance       Vision Patient Visual Report: No change from baseline       Perception     Praxis      Pertinent Vitals/Pain Pain Assessment: (P) No/denies pain Pain Score: 0-No pain     Hand Dominance Right   Extremity/Trunk Assessment Upper Extremity Assessment Upper Extremity Assessment: Generalized weakness           Communication Communication Communication: (P) Tracheostomy   Cognition Arousal/Alertness: (P) Awake/alert Behavior During Therapy: (P) Anxious Overall Cognitive Status: (P) Difficult to assess                                     General Comments       Exercises     Shoulder Instructions      Home Living Family/patient expects to be discharged to:: (P) Private residence Living Arrangements: (P) Spouse/significant other Available Help at Discharge: (P) Family Type of Home: (P) (townhouse) Home Access: (P) Level entry     Home Layout: (P) Two level     Bathroom Shower/Tub: (P) Curtain  Home Equipment: (P) Cane - single point   Additional Comments: (P) All history obtained from wife as pt unable to speak due to trach      Prior Functioning/Environment Level of Independence: (P) Independent with assistive device(s)        Comments: (P) Per wife, pt was previously independent with all ADLs, however [facility]        OT Problem List: Decreased strength;Decreased activity tolerance      OT Treatment/Interventions: Self-care/ADL training;Therapeutic exercise;Patient/family education;DME and/or AE instruction;Energy conservation    OT Goals(Current goals can be found in the care plan section) Acute Rehab OT Goals Patient Stated Goal: To return home  OT Frequency: Min 2X/week   Barriers to D/C:            Co-evaluation               AM-PAC PT "6 Clicks" Daily Activity     Outcome Measure Help from another person eating meals?: Total Help from another person taking care of personal grooming?: A Lot Help from another person toileting, which includes using toliet, bedpan, or urinal?: Total   Help from another person to put on and taking off regular upper body clothing?: Total Help from another person to put on and taking off regular lower body clothing?: Total 6 Click Score: 6   End of Session    Activity Tolerance: Patient limited by fatigue;Patient limited by lethargy Patient left: in bed;with call bell/phone within reach;with bed alarm set;with family/visitor present  OT Visit Diagnosis: Muscle weakness (generalized) (M62.81)                Time: [id number] OT Time Calculation (min): 15 min Charges:  OT General Charges $OT Visit: 1 Visit OT Evaluation $OT Eval Low Complexity: 1 Low G-Codes: OT G-codes **NOT FOR INPATIENT CLASS** Self Care Current Status ([id number]): At least 1 percent but less than 20 percent impaired, limited or restricted   Olegario MessierElaine Destyn Parfitt, MS, OTR/L   Olegario MessierElaine Orlean Holtrop, MS, OTR/L 05/03/2018, 11:37 AM

## 2018-05-03 NOTE — Progress Notes (Signed)
Nutrition Follow-up  DOCUMENTATION CODES:   Morbid obesity  INTERVENTION:  Initiate new goal TF regimen of Glucerna 1.5 Cal at 75 mL/hr (1800 mL goal daily volume) via NGT. Provides 2700 kcal, 148.5 grams of protein, 240 grams of carbohydrates, 29 grams of fiber, and 1368 mL H2O daily.  When tube feeds run at goal rate patient will receive 10 grams of carbohydrates per hour.  Will discontinue Pro-Stat.  Provide free water flush of 250 mL Q6hrs. That will provide a total of 2360 mL H2O including water in tube feeding.  NUTRITION DIAGNOSIS:   Inadequate oral intake related to inability to eat as evidenced by NPO status.  Ongoing - addressing with TF regimen.  GOAL:   Provide needs based on ASPEN/SCCM guidelines  Met with TF regimen.  MONITOR:   Vent status, Labs, Weight trends, Skin, TF tolerance, I & O's  REASON FOR ASSESSMENT:   Ventilator, Consult Enteral/tube feeding initiation and management  ASSESSMENT:   47 year old male with PMHx of A-fib s/p ablation and mechanical aortic valve replacement on warfarin, HTN, DM type 2, CHF, OSA, anxiety, CKD, HLD, pancreatitis who presented with lower back pain and sciatica. While in MRI on 5/31 suite patient suffered pulseless V. tach cardiac arrest with 5 minutes ACLS before ROSC obtained, and was intubated.   -Patient s/p placement of tracheostomy tube on 6/18. OGT was removed that day and replaced with an NGT. -PEG tube was unable to be placed on 6/21 due to inability to find good one-to-one indentation and transillumination for safe placement. -Rectal tube was removed on 6/22. -Patient was on trach collar on 6/25 for one hour with trach deflated. Placed back on vent to rest overnight. -IR has now been consulted for G-tube placement and plan is for that to be placed tomorrow.  Met with patient and wife at bedside. Patient is on mechanical ventilation through tracheostomy tube. No longer on any continuous sedation. He is in SBT  with FiO2 40%. Plan is for possible discharge to LTAC once patient meets criteria. LTAC was denied by insurance but appeal was sent on 6/25.  Access: NGT placed 6/18; still terminates in gastric body per abdominal x-ray 6/22; now 68 cm at right nare (+3 cm from original placement)  TF: pt tolerating Vital High Protein at 80 mL/hr + Pro-Stat 30 mL BID  Patient is currently intubated on ventilator support Ve: 9.9 L/min Temp (24hrs), Avg:98.5 F (36.9 C), Min:97.6 F (36.4 C), Max:98.8 F (37.1 C)  Propofol: N/A  Medications reviewed and include: clonazepam, diltiazem, famotidine, fentanyl patch, free water flush 200 mL Q4hrs, Novolog 0-20 units Q4hrs (received 23 units past 24 hrs), Lantus 85 units BID, Seroquel, heparin gtt.  Labs reviewed: CBG 131-194 past 24 hrs, BUN 68, Creatinine 2.63.  I/O: 3025 mL UOP yesterday (0.7 mL/kg/hr); 1 BM yesterday  Weight trend: 179.2 kg on 6/26; wt trending back down; currently +11.4 kg from admission weight  Discussed with RN and on rounds. Plan is for placement of G-tube tomorrow by IR.  Diet Order:   Diet Order           Diet NPO time specified Except for: Sips with Meds  Diet effective midnight          EDUCATION NEEDS:   No education needs have been identified at this time  Skin:  Skin Assessment: Skin Integrity Issues: Skin Integrity Issues:: Incisions Stage II: N/A Incisions: closed incision to neck  Last BM:  05/02/2018 - medium type 7  Height:   Ht Readings from Last 1 Encounters:  04/21/18 5' 10"  (1.778 m)    Weight:   Wt Readings from Last 1 Encounters:  05/03/18 (!) 395 lb (179.2 kg)    Ideal Body Weight:  75.5 kg  BMI:  Body mass index is 56.68 kg/m.  Estimated Nutritional Needs:   Kcal:  0919 (PSU 2003b w/ MSJ 2556)  Protein:  140-170 grams (1.85-2.25 grams/kg IBW; 0.8-1 grams/kg admission wt)  Fluid:  1.8-2.3 L/day (25-30 mL/kg IBW)  Willey Blade, MS, RD, LDN Office: [phone] Pager:  [phone] After Hours/Weekend Pager: [phone]

## 2018-05-03 NOTE — Progress Notes (Addendum)
CRITICAL CARE NOTE  CC  follow up respiratory failure  SUBJECTIVE Patient remains critically ill Prognosis is guarded Remains on vent Needs PEG tube wife updated at beside    EVENTS/RESULTS: 06/11 CT chest: Dense RLL consolidation.  Segmental RML and LLL atelectasis.  Patchy GGO in upper lobes bilaterally 06/12 rough night with desaturation.  Transient bradycardia.  06/13 More responsive.  Gas exchange improving.  No further bradycardia.  Amiodarone has been stopped. AFRVR (up to 150/min).  Follows commands.  Remains on fentanyl infusion.  Propofol discontinued. 06/17 Gas exchange improving. Rate adequately controlled. Cognition unchanged. Remains on fentanyl infusion. Gastroenterology consultation requested for G tube placement 06/18 Trach tube placement. Post op hypoxemia. Vent changes made 06/19 Transition off of continuous fentanyl. Dexmedetomidine infusion initiated 6/21 Severe symptomatic bradycardia with HR of 29, hypotension and hypoxia after receiving propofol for attempt at G tube placement. Venous doppler- left calf peroneal DVT   INDWELLING DEVICES:: R IJ CVL 05/31 >> 06/10 ETT 05/31 >> 06/18 RUE PICC 06/10 >>  Trach tube 06/18 >>       BP 124/87   Pulse (!) 161   Temp 98.5 F (36.9 C) (Oral)   Resp 18   Ht 5\' 10"  (1.778 m)   Wt (!) 395 lb (179.2 kg)   SpO2 91%   BMI 56.68 kg/m    REVIEW OF SYSTEMS  PATIENT IS UNABLE TO PROVIDE COMPLETE REVIEW OF SYSTEM S DUE TO SEVERE CRITICAL ILLNESS AND ENCEPHALOPATHY   PHYSICAL EXAMINATION:  GENERAL:critically ill appearing,  HEAD: Normocephalic, atraumatic.  EYES: Pupils equal, round, reactive to light.  No scleral icterus.  MOUTH: Moist mucosal membrane. NECK: Supple. No thyromegaly. No nodules. No JVD.  PULMONARY: +rhonchi, +wheezing CARDIOVASCULAR: S1 and S2. Regular rate and rhythm. No murmurs, rubs, or gallops.  GASTROINTESTINAL: Soft, nontender, -distended. No masses. Positive bowel sounds. No  hepatosplenomegaly.  MUSCULOSKELETAL: No swelling, clubbing, or edema.  NEUROLOGIC: lethargicGCS<10 SKIN:intact,warm,dry  ASSESSMENT AND PLAN SYNOPSIS 2M with PMH of atrial fibrillation, status post ablation and aortic valve replacement on long term warfarin, CHF, CKD, diabetes, obstructive sleep apnea and hypertension, suffered a pulseless VT cardiac arrest in MRI when he was being evaluated for back pain and sciatica, subsequently intubated in the intensive care unit. Difficult airway noted s/p trach and difficult wean from vent     Severe Hypoxic and Hypercapnic Respiratory Failure-remains on vent -continue Full MV support -continue Bronchodilator Therapy -Wean Fio2 and PEEP as tolerated -wean to PS as tolerated LTAC pending   Renal Failure-most likely due to ATN -follow chem 7 -follow UO -continue Foley Catheter-assess need   NEUROLOGY Avoid sedatives  CARDIAC S/p AVR-continue heparin infusion  ID -follow up cultures Completed abx for pneumonia  GI  Pan for PEG tube placement by IR tomorrow  DVT/GI PRX ordered TRANSFUSIONS AS NEEDED MONITOR FSBS ASSESS the need for LABS as needed   Critical Care Time devoted to patient care services described in this note is 34 minutes.     Lucie LeatherKurian David Caysen Whang, M.D.  [facility]  Medical Director [facility] Medical Director [facility] Cardio-Pulmonary Department

## 2018-05-03 NOTE — Progress Notes (Signed)
[place] for heparin drip management Indication: atrial fibrillation and aortic valve replacement   Allergies  Allergen Reactions  . Lexapro [Escitalopram Oxalate] Swelling    Patient Measurements: Height: _0  (177.8 cm) Weight: (!) 396 lb (179.6 kg) IBW/kg (Calculated) : 73 Heparin Dosing Weight: 121 kg (updated 6/21)  Vital Signs: Temp: 98.5 F (36.9 C) (06/26 0400) Temp Source: Oral (06/26 0400) BP: 121/86 (06/26 0400) Pulse Rate: 110 (06/26 0400)  Labs: Recent Labs    04/30/18 0625 05/01/18 0356 05/02/18 0551 05/02/18 1517 05/03/18 0424  HGB 12.0* 12.2*  --   --  11.4*  HCT 36.5* 37.4*  --   --  34.6*  PLT 216 221  --   --  231  HEPARINUNFRC 0.52 0.42 0.30 0.46 0.48  CREATININE  --  2.49* 2.46*  --  2.63*    Estimated Creatinine Clearance: 55.6 mL/min (A) (by C-G formula based on SCr of 2.63 mg/dL (H)).   Medical History: Past Medical History:  Diagnosis Date  . Allergy   . Anxiety   . Arrhythmia   . Arthritis   . Atrial fibrillation ([place])   . CHF (congestive heart failure) ([place])   . CKD (chronic kidney disease)   . Diabetes mellitus without complication ([place])   . Diabetes mellitus, type II ([place])   . Heart failure ([place])   . Hyperlipidemia   . Hypertension   . Pancreatitis   . Sleep apnea     Medications:  Scheduled:  . budesonide (PULMICORT) nebulizer solution  0.25 mg Nebulization Q6H  . chlorhexidine gluconate (MEDLINE KIT)  15 mL Mouth Rinse BID  . clonazePAM  1 mg Per Tube Daily  . clonazePAM  2 mg Oral QHS  . diltiazem  60 mg Oral Q6H  . famotidine  20 mg Per Tube Daily  . feeding supplement (PRO-STAT SUGAR FREE 64)  30 mL Per Tube BID  . fentaNYL  150 mcg Transdermal Q72H  . free water  200 mL Per Tube Q4H  . insulin aspart  0-20 Units Subcutaneous Q4H  . insulin glargine  85 Units Subcutaneous BID  . ipratropium-albuterol  3 mL Nebulization Q6H  . mouth rinse  15 mL Mouth Rinse 10 times per  day  . metoprolol tartrate  50 mg Per Tube BID  . QUEtiapine  100 mg Per Tube QHS  . sodium chloride flush  10-40 mL Intracatheter Q12H   Infusions:  . dextrose 5 % and 0.45% NaCl Stopped (05/02/18 0900)  . feeding supplement (VITAL HIGH PROTEIN) 80 mL/hr at 05/03/18 0200  . heparin 2,150 Units/hr (05/03/18 0411)    Assessment: Pharmacy consulted for warfarin and heparin drip management for 47 yo male admitted to the ICU s/p cardiac arrest. Patient has history significant for aortic valve replacement, atrial fibrillation and CHF. Head CT is negative for infarction or hemorrhage.   Patient takes warfarin 17m daily as an outpatient for goal INR 2.5-3.5.    Goal of Therapy:  INR 2-3 Heparin level 0.3-0.7 units/ml Monitor platelets by anticoagulation protocol: Yes   Plan:  Will continue rate at 2150 units/hr and will recheck next anti-Xa w/ am labs.   Pharmacy will continue to monitor and adjust per consult.   6/26:   HL @ 0424 = 0.48 .  Will continue this pt on current rate and recheck HL on 6/27 with AM labs.   MLS 05/03/2018

## 2018-05-03 NOTE — Progress Notes (Signed)
[facility] [place] [facility]   PATIENT NAME: Alexander Alexander    MR#:  [id number]  DATE OF BIRTH:  09/08/1971  SUBJECTIVE:  CHIEF COMPLAINT:   Chief Complaint  Patient presents with  . Back Pain  Case discussed with intensivist-for IR placed PEG tube on tomorrow  REVIEW OF SYSTEMS:  CONSTITUTIONAL: No fever, fatigue or weakness.  EYES: No blurred or double vision.  EARS, NOSE, AND THROAT: No tinnitus or ear pain.  RESPIRATORY: No cough, shortness of breath, wheezing or hemoptysis.  CARDIOVASCULAR: No chest pain, orthopnea, edema.  GASTROINTESTINAL: No nausea, vomiting, diarrhea or abdominal pain.  GENITOURINARY: No dysuria, hematuria.  ENDOCRINE: No polyuria, nocturia,  HEMATOLOGY: No anemia, easy bruising or bleeding SKIN: No rash or lesion. MUSCULOSKELETAL: No joint pain or arthritis.   NEUROLOGIC: No tingling, numbness, weakness.  PSYCHIATRY: No anxiety or depression.   ROS  DRUG ALLERGIES:   Allergies  Allergen Reactions  . Lexapro [Escitalopram Oxalate] Swelling    VITALS:  Blood pressure (!) 165/101, pulse 81, temperature 98.4 F (36.9 C), temperature source Oral, resp. rate (!) 22, height 5\' 10"  (1.778 m), weight (!) 179.2 kg (395 lb), SpO2 97 %.  PHYSICAL EXAMINATION:  GENERAL:  47 y.o.-year-old patient lying in the bed with no acute distress.  EYES: Pupils equal, round, reactive to light and accommodation. No scleral icterus. Extraocular muscles intact.  HEENT: Head atraumatic, normocephalic. Oropharynx and nasopharynx clear.  NECK:  Supple, no jugular venous distention. No thyroid enlargement, no tenderness.  LUNGS: Normal breath sounds bilaterally, no wheezing, rales,rhonchi or crepitation. No use of accessory muscles of respiration.  CARDIOVASCULAR: S1, S2 normal. No murmurs, rubs, or gallops.  ABDOMEN: Soft, nontender, nondistended. Bowel sounds present. No organomegaly or mass.  EXTREMITIES: No pedal edema, cyanosis, or clubbing.   NEUROLOGIC: Cranial nerves II through XII are intact. Muscle strength 5/5 in all extremities. Sensation intact. Gait not checked.  PSYCHIATRIC: The patient is alert and oriented x 3.  SKIN: No obvious rash, lesion, or ulcer.   Physical Exam LABORATORY PANEL:   CBC Recent Labs  Lab 05/03/18 0424  WBC 9.5  HGB 11.4*  HCT 34.6*  PLT 231   ------------------------------------------------------------------------------------------------------------------  Chemistries  Recent Labs  Lab 05/03/18 0424  NA 138  K 5.0  CL 103  CO2 26  GLUCOSE 197*  BUN 68*  CREATININE 2.63*  CALCIUM 8.6*  AST 27  ALT 20  ALKPHOS 66  BILITOT 0.9   ------------------------------------------------------------------------------------------------------------------  Cardiac Enzymes Recent Labs  Lab 04/28/18 2055 04/29/18 0218  TROPONINI 0.11* 0.11*   ------------------------------------------------------------------------------------------------------------------  RADIOLOGY:  Ct Abdomen Wo Contrast  Result Date: 05/01/2018 CLINICAL DATA:  47 y/o  M; evaluate for PEG tube placement. EXAM: CT ABDOMEN WITHOUT CONTRAST TECHNIQUE: Multidetector CT imaging of the abdomen was performed following the standard protocol without IV contrast. COMPARISON:  None. FINDINGS: Lower chest: Cardiomegaly, platelike atelectasis in the lung bases, mitral annular calcification. Hepatobiliary: No focal liver abnormality is seen. No gallstones, gallbladder wall thickening, or biliary dilatation. Pancreas: Unremarkable. No pancreatic ductal dilatation or surrounding inflammatory changes. Spleen: Normal in size without focal abnormality. Adrenals/Urinary Tract: Adrenal glands are unremarkable. Kidneys are normal, without renal calculi, focal lesion, or hydronephrosis. Stomach/Bowel: Stomach is within normal limits. Appendix appears normal. No evidence of bowel wall thickening, distention, or inflammatory changes. The left  lobe of the liver extends inferiorly overlapping the majority of the gastric body. There is no colon super position over the stomach at this time. There is  a very small area of uncovered stomach in the left anterior axillary line below the ribcage (series 2, image 32). Vascular/Lymphatic: No significant vascular findings are present. No enlarged abdominal or pelvic lymph nodes. Other: No abdominal wall hernia or abnormality. Musculoskeletal: No acute or significant osseous findings. IMPRESSION: The left lobe of the liver extends inferiorly overlapping the majority of the gastric body. There is no colon superficial to the stomach at this time. There is a very small area of uncovered stomach in the left anterior axillary line below the ribcage. Electronically Signed   By: Mitzi HansenLance  Furusawa-Stratton M.D.   On: 05/01/2018 23:14    ASSESSMENT AND PLAN:  47 year old male with past medical history significant for A. fib status post ablation, aortic valve replacement on warfarin, history of congestive heart failure, CKD, morbid obesity, diabetes and sleep apnea who was getting an outpatient MRI of his lower back had a cardiac arrest.  *Acute cardiopulmonary arrest From respiratory causes Difficult weaning from vent,s/p tracheostomy Continuemechanical ventilation with weaning as tolerated, for Pegtube placement later this week  *AcuteMRSA/VAP Resolved Treated withcourse of Linezolid Continue trach collar/vent protocol   *Atrial fibrillationwith RVR Stable Continuemetoprolol,Cardizem, amiodarone, continue Heparin drip.  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 Management plans discussed with the patient, family and they are in agreement.  CODE STATUS: full  TOTAL TIME TAKING CARE OF THIS PATIENT: 35 minutes.     POSSIBLE D/C IN 5 DAYS, DEPENDING ON CLINICAL CONDITION.   Evelena AsaMontell D Salary M.D on 05/03/2018   Between 7am to 6pm - Pager - [phone]  After 6pm go to [email or web address] - password [email or web address]  [facility]  Office  [phone]  CC: Primary care physician; Erasmo DownerBacigalupo, Angela M, MD  Note: This dictation was prepared with Dragon dictation along with smaller phrase technology. Any transcriptional errors that result from this process are unintentional.

## 2018-05-04 ENCOUNTER — Inpatient Hospital Stay: Payer: Medicare HMO

## 2018-05-04 ENCOUNTER — Ambulatory Visit: Payer: Medicare HMO | Admitting: Family

## 2018-05-04 ENCOUNTER — Other Ambulatory Visit: Payer: Self-pay | Admitting: *Deleted

## 2018-05-04 HISTORY — PX: IR GASTROSTOMY TUBE MOD SED: IMG625

## 2018-05-04 LAB — BASIC METABOLIC PANEL
ANION GAP: 7 (ref 5–15)
BUN: 65 mg/dL — AB (ref 6–20)
CO2: 28 mmol/L (ref 22–32)
Calcium: 9 mg/dL (ref 8.9–10.3)
Chloride: 104 mmol/L (ref 98–111)
Creatinine, Ser: 2.25 mg/dL — ABNORMAL HIGH (ref 0.61–1.24)
GFR calc Af Amer: 38 mL/min — ABNORMAL LOW (ref >60)
GFR, EST NON AFRICAN AMERICAN: 32 mL/min — AB (ref >60)
GLUCOSE: 87 mg/dL (ref 70–99)
Potassium: 5.1 mmol/L (ref 3.5–5.1)
Sodium: 139 mmol/L (ref 135–145)

## 2018-05-04 LAB — GLUCOSE, CAPILLARY
GLUCOSE-CAPILLARY: 143 mg/dL — AB (ref 70–99)
GLUCOSE-CAPILLARY: 144 mg/dL — AB (ref 70–99)
GLUCOSE-CAPILLARY: 78 mg/dL (ref 70–99)
Glucose-Capillary: 97 mg/dL (ref 70–99)
Glucose-Capillary: 78 mg/dL (ref 70–99)
Glucose-Capillary: 85 mg/dL (ref 70–99)
Glucose-Capillary: 104 mg/dL — ABNORMAL HIGH (ref 70–99)

## 2018-05-04 LAB — PROTIME-INR
INR: 1.3
PROTHROMBIN TIME: 16.1 s — AB (ref 11.4–15.2)

## 2018-05-04 MED ORDER — GLUCERNA 1.5 CAL PO LIQD: 1000.0000 mL | ORAL | Status: DC

## 2018-05-04 MED ORDER — DILTIAZEM HCL 60 MG PO TABS
ORAL_TABLET | ORAL | Status: AC
  Administered 2018-05-04: 60 mg via ORAL
  Filled 2018-05-04: qty 1

## 2018-05-04 MED ORDER — DEXTROSE 5 % IV SOLN
INTRAVENOUS | Status: DC
Start: 2018-05-04 — End: 2018-05-06
  Administered 2018-05-04: 09:00:00 via INTRAVENOUS

## 2018-05-04 MED ORDER — GLUCERNA 1.5 CAL PO LIQD
1000.0000 mL | ORAL | Status: DC
  Administered 2018-05-04 – 2018-05-08 (×8): 1000 mL

## 2018-05-04 MED ORDER — IOPAMIDOL (ISOVUE-300) INJECTION 61%
30.0000 mL | Freq: Once | INTRAVENOUS | Status: AC | PRN
  Administered 2018-05-04: 30 mL

## 2018-05-04 MED ORDER — MIDAZOLAM HCL 5 MG/5ML IJ SOLN
INTRAMUSCULAR | Status: AC
  Filled 2018-05-04: qty 5

## 2018-05-04 MED ORDER — FENTANYL CITRATE (PF) 100 MCG/2ML IJ SOLN
INTRAMUSCULAR | Status: AC
  Filled 2018-05-04: qty 2

## 2018-05-04 MED ORDER — HEPARIN (PORCINE) IN NACL 100-0.45 UNIT/ML-% IJ SOLN
2300.0000 [IU]/h | INTRAMUSCULAR | Status: AC
  Administered 2018-05-04 – 2018-05-05 (×2): 2150 [IU]/h via INTRAVENOUS
  Administered 2018-05-05: 2300 [IU]/h via INTRAVENOUS
  Filled 2018-05-04 (×4): qty 250

## 2018-05-04 NOTE — Care Management (Signed)
[facility] can [facility] patient if PEG placed today. [facility] waiting on appeal to clear. [employer] has called [facility] looking for a bed. [facility] will start authorization once RNCM give approval for go ahead.

## 2018-05-04 NOTE — Progress Notes (Signed)
ANTICOAGULATION CONSULT NOTE   Pharmacy Consult for heparin drip management Indication: atrial fibrillation and aortic valve replacement   Allergies  Allergen Reactions  . Lexapro [Escitalopram Oxalate] Swelling    Patient Measurements: Height: 5\' 10"  (177.8 cm) Weight: (!) 394 lb (178.7 kg) IBW/kg (Calculated) : 73 Heparin Dosing Weight: 121 kg (updated 6/21)  Vital Signs: Temp: 98.1 F (36.7 C) (06/27 1200) Temp Source: Oral (06/27 1200) BP: 147/88 (06/27 1500) Pulse Rate: 98 (06/27 1430)  Labs: Recent Labs    05/02/18 0551 05/02/18 1517 05/03/18 0424 05/04/18 0525 05/04/18 1046  HGB  --   --  11.4*  --   --   HCT  --   --  34.6*  --   --   PLT  --   --  231  --   --   LABPROT  --   --   --  16.1*  --   INR  --   --   --  1.30  --   HEPARINUNFRC 0.30 0.46 0.48  --   --   CREATININE 2.46*  --  2.63*  --  2.25*    Estimated Creatinine Clearance: 64.8 mL/min (A) (by C-G formula based on SCr of 2.25 mg/dL (H)).   Medical History: Past Medical History:  Diagnosis Date  . Allergy   . Anxiety   . Arrhythmia   . Arthritis   . Atrial fibrillation ([facility])   . CHF (congestive heart failure) ([facility])   . CKD (chronic kidney disease)   . Diabetes mellitus without complication ([facility])   . Diabetes mellitus, type II ([facility])   . Heart failure ([facility])   . Hyperlipidemia   . Hypertension   . Pancreatitis   . Sleep apnea     Medications:  Scheduled:  . budesonide (PULMICORT) nebulizer solution  0.25 mg Nebulization Q6H  . clonazePAM  1 mg Per Tube Daily  . clonazePAM  2 mg Oral QHS  . diltiazem  60 mg Oral Q6H  . famotidine  20 mg Per Tube Daily  . fentaNYL  150 mcg Transdermal Q72H  . free water  250 mL Per Tube Q6H  . insulin aspart  0-20 Units Subcutaneous Q4H  . insulin glargine  85 Units Subcutaneous BID  . ipratropium-albuterol  3 mL Nebulization Q6H  . mouth rinse  15 mL Mouth Rinse BID  . metoprolol tartrate  50 mg Per Tube BID  . QUEtiapine  100 mg Per Tube  QHS  . sodium chloride flush  10-40 mL Intracatheter Q12H   Infusions:  . dextrose 50 mL/hr at 05/04/18 1400  . feeding supplement (GLUCERNA 1.5 CAL)    . heparin      Assessment: Pharmacy consulted for warfarin and heparin drip management for 47 yo male admitted to the ICU s/p cardiac arrest. Patient has history significant for aortic valve replacement, atrial fibrillation and CHF. Head CT is negative for infarction or hemorrhage.   Patient takes warfarin 5mg  daily as an outpatient for goal INR 2.5-3.5.    Goal of Therapy:  INR 2-3 Heparin level 0.3-0.7 units/ml Monitor platelets by anticoagulation protocol: Yes   Plan:  Heparin resumed post PEG at previous rate of 2150 units/hr. Will obtain heparin level at 2200.   Pharmacy will continue to monitor and adjust per consult.   Crayton Savarese L 05/04/2018

## 2018-05-04 NOTE — Progress Notes (Signed)
[facility] [place] [facility]   PATIENT NAME: Alexander Alexander    MR#:  [id number]  DATE OF BIRTH:  [birth year]  SUBJECTIVE:  Awake and alert. Wife in the room  REVIEW OF SYSTEMS:  Review of Systems  Unable to perform ROS: Intubated    DRUG ALLERGIES:   Allergies  Allergen Reactions  . Lexapro [Escitalopram Oxalate] Swelling    VITALS:  Blood pressure (!) 133/95, pulse (!) 101, temperature 98.4 F (36.9 C), temperature source Oral, resp. rate 18, height 5\' 10"  (1.778 m), weight (!) 178.7 kg (394 lb), SpO2 92 %.  PHYSICAL EXAMINATION:  GENERAL:  47 y.o.-year-old patient lying in the bed with no acute distress. obese EYES: Pupils equal, round, reactive to light and accommodation. No scleral icterus. Extraocular muscles intact.  HEENT: Head atraumatic, normocephalic. Oropharynx and nasopharynx clear. Trach++ NECK:  Supple, no jugular venous distention. No thyroid enlargement, no tenderness.  LUNGS: Normal breath sounds bilaterally, no wheezing, rales,rhonchi or crepitation. No use of accessory muscles of respiration.  CARDIOVASCULAR: S1, S2 normal. No murmurs, rubs, or gallops.  ABDOMEN: Soft, nontender, nondistended. Bowel sounds present. No organomegaly or mass.  EXTREMITIES: ++ pedal edema, cyanosis, or clubbing.  NEUROLOGIC: moves extremities, no focal weakness PSYCHIATRIC: The patient is alert  SKIN: No obvious rash, lesion, or ulcer.   Physical Exam LABORATORY PANEL:   CBC Recent Labs  Lab 05/03/18 0424  WBC 9.5  HGB 11.4*  HCT 34.6*  PLT 231   ------------------------------------------------------------------------------------------------------------------  Chemistries  Recent Labs  Lab 05/03/18 0424 05/04/18 1046  NA 138 139  K 5.0 5.1  CL 103 104  CO2 26 28  GLUCOSE 197* 87  BUN 68* 65*  CREATININE 2.63* 2.25*  CALCIUM 8.6* 9.0  AST 27  --   ALT 20  --   ALKPHOS 66  --   BILITOT 0.9  --     ------------------------------------------------------------------------------------------------------------------  Cardiac Enzymes Recent Labs  Lab 04/28/18 2055 04/29/18 0218  TROPONINI 0.11* 0.11*   ------------------------------------------------------------------------------------------------------------------  RADIOLOGY:  Dg Abd 1 View  Result Date: 05/04/2018 CLINICAL DATA:  Preop for gastrostomy tube placement EXAM: ABDOMEN - 1 VIEW COMPARISON:  Abdominal CT from 3 days ago FINDINGS: Nasogastric tube tip over the stomach. Normal bowel gas pattern. Low volume chest with streaky opacities. IMPRESSION: Nasogastric tube with tip over the stomach. Limited abdominal coverage with no dilated bowel seen. Electronically Signed   By: Marnee SpringJonathon  Watts M.D.   On: 05/04/2018 12:27   Ir Gastrostomy Tube Mod Sed  Result Date: 05/04/2018 INDICATION: 47 year old with multiple medical problems including history of acute cardiopulmonary arrest. Patient has a tracheostomy tube and request for gastrostomy tube. GI was unable to safely place a gastrostomy tube with endoscopy. Patient had an abdominal CT that suggested there may be a safe percutaneous window for gastrostomy tube placement. Informed consent was obtained from the patient's wife. EXAM: FLUOROSCOPY TIME FOR GASTROSTOMY TUBE EVALUATION Physician: Rachelle HoraAdam R. Henn, MD MEDICATIONS: None ANESTHESIA/SEDATION: None FLUOROSCOPY TIME:  Fluoroscopy Time: 3 minutes and 48 seconds, 650 mGy COMPLICATIONS: None immediate. PROCEDURE: The procedure was explained to the patient. The risks and benefits of the procedure were discussed and the patient' s white questions were addressed. Informed consent was obtained from the patient's wife. Patient was placed supine on the interventional table. The stomach was insufflated with gas through the nasogastric tube. The colon is gas-filled and well visualized. However, there appeared to be redundant loops of colon in the  splenic flexure that were  projecting over the stomach. Due to patient's morbid obesity, it was difficult to get a true lateral view to assess the exact position of the colon. Based on the fluoroscopic evaluation, a safe percutaneous window could not be identified. Therefore, gastrostomy tube was not attempted. IMPRESSION: Percutaneous gastrostomy tube was not attempted because a safe percutaneous window was not identified. Consider surgical consultation. Electronically Signed   By: Richarda Overlie M.D.   On: 05/04/2018 15:40    ASSESSMENT AND PLAN:  47 year old male with past medical history significant for A. fib status post ablation, aortic valve replacement on warfarin, history of congestive heart failure, CKD, morbid obesity, diabetes and sleep apnea who was getting an outpatient MRI of his lower back had a cardiac arrest.  *Acute cardiopulmonary arrest From respiratory causes Difficult weaning from vent,s/p tracheostomy Continuemechanical ventilation with weaning as tolerated, for Pegtube placement today with IR unsuccessful since safe window not found. Recommends surgical consultation  *AcuteMRSA/VAP Treated withcourse of Linezolid Continue trach collar/vent protocol   *Atrial fibrillationwith RVR Continuemetoprolol,Cardizem, amiodarone, continue Heparin drip.  *History of mechanical aortic valve replacement Onheparin drip  *Acute toxic metabolic encephalopathy  Neurology input appreciated  Continue Kepprafor possible partial seizures  *Chronic diabetes mellitus type 2 Stable on current regiment  *acute renal failurew/ CKD stage III Secondary toATN  *Acute septic shock, MRSA pneumonia Resolved Plan to complete 14 days linezolid  *F/E/N ForPEG tube placementby IR---unsuccessful. await surgical consult  Disposition to LTAC status post discharge  All the records are reviewed and case discussed with Care Management/Social Workerr. Management plans  discussed with the patient, family and they are in agreement.  CODE STATUS: full  TOTAL TIME TAKING CARE OF THIS PATIENT: 35 minutes.     POSSIBLE D/C INfew DAYS, DEPENDING ON CLINICAL CONDITION.   Enedina Finner M.D on 05/04/2018   Between 7am to 6pm - Pager - [phone]  After 6pm go to [email or web address] - password [email or web address]  [facility]  Office  [phone]  CC: Primary care physician; Erasmo Downer, MD  Note: This dictation was prepared with Dragon dictation along with smaller phrase technology. Any transcriptional errors that result from this process are unintentional.

## 2018-05-04 NOTE — Plan of Care (Signed)
Pt to Specials this shift, Radiologist unable to place GTube, pt returned to room, TF restarted w/ PepUp, heparin restarted at 2150 u/hr per RPt.  Pt resting quietly, eyes closed.

## 2018-05-04 NOTE — Procedures (Signed)
Patient was evaluated under fluoroscopy.  Air was placed in stomach through the NGT tube.  The transverse colon and splenic flexure contained gas and visible with fluoroscopy.  Parts of the colon were projecting over the stomach and not clear if the loops were completely posterior to the stomach.  A safe percutaneous window could not be identified.  Therefore, percutaneous gastrostomy tube was not placed.  Consider surgical consultation for open gastrostomy or J-tube.

## 2018-05-04 NOTE — Progress Notes (Signed)
ANTICOAGULATION CONSULT NOTE   Pharmacy Consult for heparin drip management Indication: atrial fibrillation and aortic valve replacement   Allergies  Allergen Reactions  . Lexapro [Escitalopram Oxalate] Swelling    Patient Measurements: Height: 5\' 10"  (177.8 cm) Weight: (!) 394 lb (178.7 kg) IBW/kg (Calculated) : 73 Heparin Dosing Weight: 121 kg (updated 6/21)  Vital Signs: Temp: 98.7 F (37.1 C) (06/27 0400) Temp Source: Oral (06/27 0400) BP: 141/90 (06/27 0600) Pulse Rate: 110 (06/27 0600)  Labs: Recent Labs    05/02/18 0551 05/02/18 1517 05/03/18 0424  HGB  --   --  11.4*  HCT  --   --  34.6*  PLT  --   --  231  HEPARINUNFRC 0.30 0.46 0.48  CREATININE 2.46*  --  2.63*    Estimated Creatinine Clearance: 55.4 mL/min (A) (by C-G formula based on SCr of 2.63 mg/dL (H)).   Medical History: Past Medical History:  Diagnosis Date  . Allergy   . Anxiety   . Arrhythmia   . Arthritis   . Atrial fibrillation (HCC)   . CHF (congestive heart failure) (HCC)   . CKD (chronic kidney disease)   . Diabetes mellitus without complication (HCC)   . Diabetes mellitus, type II (HCC)   . Heart failure (HCC)   . Hyperlipidemia   . Hypertension   . Pancreatitis   . Sleep apnea     Medications:  Scheduled:  . budesonide (PULMICORT) nebulizer solution  0.25 mg Nebulization Q6H  . clonazePAM  1 mg Per Tube Daily  . clonazePAM  2 mg Oral QHS  . diltiazem  60 mg Oral Q6H  . famotidine  20 mg Per Tube Daily  . fentaNYL  150 mcg Transdermal Q72H  . free water  250 mL Per Tube Q6H  . insulin aspart  0-20 Units Subcutaneous Q4H  . insulin glargine  85 Units Subcutaneous BID  . ipratropium-albuterol  3 mL Nebulization Q6H  . mouth rinse  15 mL Mouth Rinse BID  . metoprolol tartrate  50 mg Per Tube BID  . QUEtiapine  100 mg Per Tube QHS  . sodium chloride flush  10-40 mL Intracatheter Q12H   Infusions:    Assessment: Pharmacy consulted for warfarin and heparin drip  management for 47 yo male admitted to the ICU s/p cardiac arrest. Patient has history significant for aortic valve replacement, atrial fibrillation and CHF. Head CT is negative for infarction or hemorrhage.   Patient takes warfarin 5mg  daily as an outpatient for goal INR 2.5-3.5.    Goal of Therapy:  INR 2-3 Heparin level 0.3-0.7 units/ml Monitor platelets by anticoagulation protocol: Yes   Plan:  Patient is to have PEG tube placement by IR on 6/27. Per IR instructions will hold heparin drip starting at 0600 on 6/27.   Pharmacy will continue to monitor and adjust per consult.    6/27:  Called RN around [phone] and informed her to D/C heparin drip.  Christian Borgerding D 05/04/2018

## 2018-05-04 NOTE — Progress Notes (Signed)
Transported on vent for procedure in Vascular. No complications

## 2018-05-04 NOTE — Sedation Documentation (Signed)
Dr Lowella DandyHenn unable to do procedure r/t unable to visualize abd

## 2018-05-05 LAB — HEPARIN LEVEL (UNFRACTIONATED)
HEPARIN UNFRACTIONATED: 0.29 [IU]/mL — AB (ref 0.30–0.70)
Heparin Unfractionated: 0.75 IU/mL — ABNORMAL HIGH (ref 0.30–0.70)
Heparin Unfractionated: 0.49 IU/mL (ref 0.30–0.70)

## 2018-05-05 LAB — CBC
HCT: 34.6 % — ABNORMAL LOW (ref 40.0–52.0)
Hemoglobin: 11.2 g/dL — ABNORMAL LOW (ref 13.0–18.0)
MCH: 27.3 pg (ref 26.0–34.0)
MCHC: 32.3 g/dL (ref 32.0–36.0)
MCV: 84.6 fL (ref 80.0–100.0)
PLATELETS: 268 10*3/uL (ref 150–440)
RBC: 4.1 MIL/uL — ABNORMAL LOW (ref 4.40–5.90)
RDW: 18.9 % — ABNORMAL HIGH (ref 11.5–14.5)
WBC: 9.5 10*3/uL (ref 3.8–10.6)

## 2018-05-05 LAB — GLUCOSE, CAPILLARY
GLUCOSE-CAPILLARY: 209 mg/dL — AB (ref 70–99)
GLUCOSE-CAPILLARY: 181 mg/dL — AB (ref 70–99)
Glucose-Capillary: 189 mg/dL — ABNORMAL HIGH (ref 70–99)
Glucose-Capillary: 158 mg/dL — ABNORMAL HIGH (ref 70–99)
Glucose-Capillary: 141 mg/dL — ABNORMAL HIGH (ref 70–99)

## 2018-05-05 MED ORDER — HYDRALAZINE HCL 20 MG/ML IJ SOLN
10.0000 mg | INTRAMUSCULAR | Status: DC | PRN
  Administered 2018-05-06 – 2018-05-11 (×9): 10 mg via INTRAVENOUS
  Filled 2018-05-05 (×10): qty 1

## 2018-05-05 MED ORDER — CHLORHEXIDINE GLUCONATE 0.12 % MT SOLN
15.0000 mL | Freq: Two times a day (BID) | OROMUCOSAL | Status: DC
  Administered 2018-05-05 – 2018-05-24 (×28): 15 mL via OROMUCOSAL
  Filled 2018-05-05 (×26): qty 15

## 2018-05-05 MED ORDER — METOPROLOL TARTRATE 5 MG/5ML IV SOLN
INTRAVENOUS | Status: AC
  Administered 2018-05-05: 5 mg via INTRAVENOUS
  Filled 2018-05-05: qty 5

## 2018-05-05 MED ORDER — METOPROLOL TARTRATE 5 MG/5ML IV SOLN
5.0000 mg | Freq: Four times a day (QID) | INTRAVENOUS | Status: DC | PRN
  Administered 2018-05-05 – 2018-05-07 (×4): 5 mg via INTRAVENOUS
  Filled 2018-05-05 (×3): qty 5

## 2018-05-05 MED ORDER — FENTANYL CITRATE (PF) 100 MCG/2ML IJ SOLN
INTRAMUSCULAR | Status: AC
  Administered 2018-05-05: 50 ug
  Filled 2018-05-05: qty 2

## 2018-05-05 MED ORDER — HEPARIN BOLUS VIA INFUSION
1800.0000 [IU] | Freq: Once | INTRAVENOUS | Status: AC
  Administered 2018-05-05: 1800 [IU] via INTRAVENOUS
  Filled 2018-05-05: qty 1800

## 2018-05-05 MED ORDER — INSULIN ASPART 100 UNIT/ML ~~LOC~~ SOLN
SUBCUTANEOUS | Status: AC
  Administered 2018-05-05: 7 [IU] via SUBCUTANEOUS
  Filled 2018-05-05: qty 1

## 2018-05-05 MED ORDER — FENTANYL CITRATE (PF) 100 MCG/2ML IJ SOLN
50.0000 ug | Freq: Once | INTRAMUSCULAR | Status: AC
  Administered 2018-05-05: 50 ug via INTRAVENOUS

## 2018-05-05 MED ORDER — ORAL CARE MOUTH RINSE
15.0000 mL | Freq: Two times a day (BID) | OROMUCOSAL | Status: DC
  Administered 2018-05-05: 15 mL via OROMUCOSAL

## 2018-05-05 NOTE — Progress Notes (Signed)
ANTICOAGULATION CONSULT NOTE   Pharmacy Consult for heparin drip management Indication: atrial fibrillation and aortic valve replacement   Allergies  Allergen Reactions  . Lexapro [Escitalopram Oxalate] Swelling    Patient Measurements: Height: 5\' 10"  (177.8 cm) Weight: (!) 394 lb (178.7 kg) IBW/kg (Calculated) : 73 Heparin Dosing Weight: 121 kg (updated 6/21)  Vital Signs: Temp: 98.2 F (36.8 C) (06/28 0000) Temp Source: Oral (06/28 0000) BP: 111/91 (06/28 0100) Pulse Rate: 92 (06/28 0100)  Labs: Recent Labs    05/02/18 0551 05/02/18 1517 05/03/18 0424 05/04/18 0525 05/04/18 1046 05/04/18 2251  HGB  --   --  11.4*  --   --   --   HCT  --   --  34.6*  --   --   --   PLT  --   --  231  --   --   --   LABPROT  --   --   --  16.1*  --   --   INR  --   --   --  1.30  --   --   HEPARINUNFRC 0.30 0.46 0.48  --   --  0.29*  CREATININE 2.46*  --  2.63*  --  2.25*  --     Estimated Creatinine Clearance: 64.8 mL/min (A) (by C-G formula based on SCr of 2.25 mg/dL (H)).   Medical History: Past Medical History:  Diagnosis Date  . Allergy   . Anxiety   . Arrhythmia   . Arthritis   . Atrial fibrillation (HCC)   . CHF (congestive heart failure) (HCC)   . CKD (chronic kidney disease)   . Diabetes mellitus without complication (HCC)   . Diabetes mellitus, type II (HCC)   . Heart failure (HCC)   . Hyperlipidemia   . Hypertension   . Pancreatitis   . Sleep apnea     Medications:  Scheduled:  . budesonide (PULMICORT) nebulizer solution  0.25 mg Nebulization Q6H  . clonazePAM  1 mg Per Tube Daily  . clonazePAM  2 mg Oral QHS  . diltiazem  60 mg Oral Q6H  . famotidine  20 mg Per Tube Daily  . fentaNYL  150 mcg Transdermal Q72H  . free water  250 mL Per Tube Q6H  . heparin  1,800 Units Intravenous Once  . insulin aspart  0-20 Units Subcutaneous Q4H  . insulin glargine  85 Units Subcutaneous BID  . ipratropium-albuterol  3 mL Nebulization Q6H  . mouth rinse  15 mL  Mouth Rinse BID  . metoprolol tartrate  50 mg Per Tube BID  . QUEtiapine  100 mg Per Tube QHS  . sodium chloride flush  10-40 mL Intracatheter Q12H   Infusions:  . dextrose Stopped (05/05/18 0045)  . feeding supplement (GLUCERNA 1.5 CAL) 75 mL/hr at 05/05/18 0015  . heparin 2,150 Units/hr (05/05/18 0018)    Assessment: Pharmacy consulted for warfarin and heparin drip management for 47 yo male admitted to the ICU s/p cardiac arrest. Patient has history significant for aortic valve replacement, atrial fibrillation and CHF. Head CT is negative for infarction or hemorrhage.   Patient takes warfarin 5mg  daily as an outpatient for goal INR 2.5-3.5.    Goal of Therapy:  INR 2-3 Heparin level 0.3-0.7 units/ml Monitor platelets by anticoagulation protocol: Yes   Plan:  Heparin resumed post PEG at previous rate of 2150 units/hr. Will obtain heparin level at 2200.   Pharmacy will continue to monitor and adjust per consult.  6/27: HL @ 23:00 = 0.29 Will order Heparin 1800 units X 1 and increase drip rate to 2400 units/hr. Will recheck HL 6 hrs after rate change.   Venesa Semidey D 05/05/2018

## 2018-05-05 NOTE — Progress Notes (Signed)
ANTICOAGULATION CONSULT NOTE   Pharmacy Consult for heparin drip management Indication: atrial fibrillation and aortic valve replacement   Allergies  Allergen Reactions  . Lexapro [Escitalopram Oxalate] Swelling    Patient Measurements: Height: 5\' 10"  (177.8 cm) Weight: (bed weight not working ) IBW/kg (Calculated) : 73 Heparin Dosing Weight: 121 kg (updated 6/21)  Vital Signs: Temp: 98.8 F (37.1 C) (06/28 1200) Temp Source: Oral (06/28 1200) BP: 129/88 (06/28 1100) Pulse Rate: 92 (06/28 1200)  Labs: Recent Labs    05/03/18 0424 05/04/18 0525 05/04/18 1046 05/04/18 2251 05/05/18 0343 05/05/18 0757  HGB 11.4*  --   --   --  11.2*  --   HCT 34.6*  --   --   --  34.6*  --   PLT 231  --   --   --  268  --   LABPROT  --  16.1*  --   --   --   --   INR  --  1.30  --   --   --   --   HEPARINUNFRC 0.48  --   --  0.29*  --  0.75*  CREATININE 2.63*  --  2.25*  --   --   --     Estimated Creatinine Clearance: 64.8 mL/min (A) (by C-G formula based on SCr of 2.25 mg/dL (H)).   Medical History: Past Medical History:  Diagnosis Date  . Allergy   . Anxiety   . Arrhythmia   . Arthritis   . Atrial fibrillation (HCC)   . CHF (congestive heart failure) (HCC)   . CKD (chronic kidney disease)   . Diabetes mellitus without complication (HCC)   . Diabetes mellitus, type II (HCC)   . Heart failure (HCC)   . Hyperlipidemia   . Hypertension   . Pancreatitis   . Sleep apnea     Medications:  Scheduled:  . budesonide (PULMICORT) nebulizer solution  0.25 mg Nebulization Q6H  . chlorhexidine  15 mL Mouth Rinse BID  . clonazePAM  1 mg Per Tube Daily  . clonazePAM  2 mg Oral QHS  . diltiazem  60 mg Oral Q6H  . famotidine  20 mg Per Tube Daily  . fentaNYL  150 mcg Transdermal Q72H  . free water  250 mL Per Tube Q6H  . insulin aspart  0-20 Units Subcutaneous Q4H  . insulin glargine  85 Units Subcutaneous BID  . ipratropium-albuterol  3 mL Nebulization Q6H  . mouth rinse  15  mL Mouth Rinse q12n4p  . metoprolol tartrate  50 mg Per Tube BID  . QUEtiapine  100 mg Per Tube QHS  . sodium chloride flush  10-40 mL Intracatheter Q12H   Infusions:  . dextrose Stopped (05/05/18 0045)  . feeding supplement (GLUCERNA 1.5 CAL) 1,000 mL (05/05/18 1213)  . heparin 2,300 Units/hr (05/05/18 1353)    Assessment: Pharmacy consulted for warfarin and heparin drip management for 47 yo male admitted to the ICU s/p cardiac arrest. Patient has history significant for aortic valve replacement, atrial fibrillation and CHF. Head CT is negative for infarction or hemorrhage.   Patient takes warfarin 5mg  daily as an outpatient for goal INR 2.5-3.5.    Goal of Therapy:  INR 2-3 Heparin level 0.3-0.7 units/ml Monitor platelets by anticoagulation protocol: Yes   Plan:  Heparin level elevated will decrease heparin to 2300 units/hr. Will obtain heparin level at 2000.   Pharmacy will continue to monitor and adjust per consult.   Lecil Tapp L  05/05/2018    

## 2018-05-05 NOTE — Care Management (Addendum)
RNCM spoke with patient's wife regarding LTAC and PEG. She states IR could not place the PEG and the MD told her patient was not a good candidate for surgery for g-tube. She states patient is better and she hopes that he can be weaned from ventilator and then be able to eat again.  She was not informed that [facility] may be out of network or that [facility]. She is unclear of what to do. I confirmed that [facility] [employer] [facility]. Neither facility will take patient without a PEG/g-tube. Update at 1110A: [facility] was denied by [employer]. Patient's wife has been notified that [facility] may be the only LTAC available and asked for her to call this RNCM to allow start of authorization if she chooses. Erika with [facility] updated. Update at 1130A: wife called RNCM stating to start authorization process with [facility]- Erika updated.

## 2018-05-05 NOTE — Progress Notes (Signed)
Follow up - Critical Care Medicine Note  Patient Details:    Alexander Alexander is an 46 y.o. male.with atrial fibrillation, status post ablation and aortic valve replacement on long term warfarin, CHF, CKD, diabetes, obstructive sleep apnea and hypertension, suffered a pulseless VT cardiac arrest in MRI when he was being evaluated for back pain and sciatica, subsequently intubated in the intensive care unit. Difficult airway noted    Lines, Airways, Drains: PICC Triple Lumen 04/17/18 PICC Right Cephalic 39 cm 0 cm (Active)  Indication for Insertion or Continuance of Line Prolonged intravenous therapies 05/05/2018  7:51 AM  Exposed Catheter (cm) 0 cm 05/01/2018  5:37 PM  Site Assessment Clean;Dry;Intact 05/04/2018  7:27 PM  Lumen #1 Status Infusing;Flushed;Blood return noted 05/04/2018  7:27 PM  Lumen #2 Status Infusing;Flushed;Blood return noted 05/04/2018  7:27 PM  Lumen #3 Status Flushed;Saline locked;Blood return noted 05/04/2018  7:27 PM  Dressing Type Transparent 05/04/2018  7:27 PM  Dressing Status Antimicrobial disc in place;Intact;Dry;Clean 05/04/2018  7:27 PM  Line Care Connections checked and tightened 05/04/2018  7:27 PM  Dressing Intervention Other (Comment) 05/04/2018  7:27 PM  Dressing Change Due 05/11/18 05/04/2018  7:27 PM     NG/OG Tube Nasogastric Right nare Xray Documented cm marking at nare/ corner of mouth 65 cm (Active)  Cm Marking at Nare/Corner of Mouth (if applicable) 65 cm 05/04/2018  7:40 PM  Site Assessment Clean;Dry;Intact 05/05/2018  8:00 AM  Ongoing Placement Verification No acute changes, not attributed to clinical condition;No change in respiratory status;No change in cm markings or external length of tube from initial placement 05/05/2018  8:00 AM  Status Infusing tube feed 05/05/2018  8:00 AM  Intake (mL) 55 mL 05/05/2018  8:00 AM  Output (mL) 100 mL 05/03/2018 12:00 AM     Urethral Catheter C Green, Rn Double-lumen 14 Fr. (Active)  Indication for Insertion or  Continuance of Catheter Chronic catheter use 05/05/2018  8:00 AM  Site Assessment Clean;Intact 05/04/2018  7:27 PM  Catheter Maintenance Bag below level of bladder;Catheter secured;Drainage bag/tubing not touching floor;Insertion date on drainage bag;No dependent loops;Seal intact 05/05/2018  8:00 AM  Collection Container Standard drainage bag 05/04/2018  7:27 PM  Securement Method Other (Comment) 05/04/2018  7:27 PM  Urinary Catheter Interventions Unclamped 05/04/2018  7:27 PM  Output (mL) 325 mL 05/05/2018  8:00 AM    Anti-infectives:  Anti-infectives (From admission, onward)   Start     Dose/Rate Route Frequency Ordered Stop   04/28/18 1100  ceFAZolin (ANCEF) 3 g in dextrose 5 % 50 mL IVPB     3 g 100 mL/hr over 30 Minutes Intravenous  Once 04/28/18 0058 04/28/18 1058   04/19/18 1000  linezolid (ZYVOX) IVPB 600 mg  Status:  Discontinued     600 mg 300 mL/hr over 60 Minutes Intravenous Every 12 hours 04/19/18 0826 04/28/18 1011   04/18/18 1330  ceFEPIme (MAXIPIME) 2 g in sodium chloride 0.9 % 100 mL IVPB  Status:  Discontinued     2 g 200 mL/hr over 30 Minutes Intravenous Every 12 hours 04/18/18 1322 04/21/18 1108   04/18/18 0800  vancomycin (VANCOCIN) 1,250 mg in sodium chloride 0.9 % 250 mL IVPB  Status:  Discontinued     1,250 mg 166.7 mL/hr over 90 Minutes Intravenous Every 24 hours 04/18/18 0621 04/19/18 0826   04/17/18 0630  vancomycin (VANCOCIN) 1,250 mg in sodium chloride 0.9 % 250 mL IVPB     1,250 mg 166.7 mL/hr over 90 Minutes Intravenous  Once 04/17/18 0618 04/17/18 0855   04/14/18 1430  vancomycin (VANCOCIN) IVPB 1000 mg/200 mL premix  Status:  Discontinued     1,000 mg 200 mL/hr over 60 Minutes Intravenous Every 12 hours 04/14/18 1348 04/16/18 1450   04/11/18 2000  vancomycin (VANCOCIN) 1,250 mg in sodium chloride 0.9 % 250 mL IVPB  Status:  Discontinued     1,250 mg 166.7 mL/hr over 90 Minutes Intravenous Every 18 hours 04/11/18 1548 04/14/18 1348   04/10/18 1400   vancomycin (VANCOCIN) 1,250 mg in sodium chloride 0.9 % 250 mL IVPB  Status:  Discontinued     1,250 mg 166.7 mL/hr over 90 Minutes Intravenous Every 12 hours 04/10/18 1149 04/11/18 1548   04/10/18 1200  Ampicillin-Sulbactam (UNASYN) 3 g in sodium chloride 0.9 % 100 mL IVPB  Status:  Discontinued     3 g 200 mL/hr over 30 Minutes Intravenous Every 6 hours 04/10/18 0851 04/10/18 1131   04/09/18 1800  vancomycin (VANCOCIN) 1,500 mg in sodium chloride 0.9 % 500 mL IVPB  Status:  Discontinued     1,500 mg 250 mL/hr over 120 Minutes Intravenous Every 18 hours 04/09/18 1303 04/10/18 0750   04/09/18 1045  vancomycin (VANCOCIN) IVPB 1000 mg/200 mL premix     1,000 mg 200 mL/hr over 60 Minutes Intravenous  Once 04/09/18 1041 04/09/18 1307   04/07/18 1400  piperacillin-tazobactam (ZOSYN) IVPB 3.375 g  Status:  Discontinued     3.375 g 12.5 mL/hr over 240 Minutes Intravenous Every 8 hours 04/07/18 1015 04/10/18 0851      Microbiology: Results for orders placed or performed during the hospital encounter of 04/07/18  MRSA PCR Screening     Status: None   Collection Time: 04/07/18  9:49 AM  Result Value Ref Range Status   MRSA by PCR NEGATIVE NEGATIVE Final    Comment:        The GeneXpert MRSA Assay (FDA approved for NASAL specimens only), is one component of a comprehensive MRSA colonization surveillance program. It is not intended to diagnose MRSA infection nor to guide or monitor treatment for MRSA infections. Performed at [facility], [address] [place], [place] [address]   Culture, respiratory (NON-Expectorated)     Status: None   Collection Time: 04/08/18 10:53 AM  Result Value Ref Range Status   Specimen Description   Final    TRACHEAL ASPIRATE Performed at [facility], [address] [place], [place] [address]    Special Requests   Final    Normal Performed at [facility], [address] [place], [place] [address]    Gram Stain   Final     ABUNDANT WBC PRESENT, PREDOMINANTLY PMN NO SQUAMOUS EPITHELIAL CELLS SEEN MODERATE GRAM POSITIVE COCCI IN CLUSTERS FEW GRAM NEGATIVE COCCOBACILLI RARE GRAM POSITIVE RODS Performed at [facility], [address] [place], [place] [address]    Culture   Final    ABUNDANT METHICILLIN RESISTANT STAPHYLOCOCCUS AUREUS   Report Status 04/10/2018 FINAL  Final   Organism ID, Bacteria METHICILLIN RESISTANT STAPHYLOCOCCUS AUREUS  Final      Susceptibility   Methicillin resistant staphylococcus aureus - MIC*    CIPROFLOXACIN >=8 RESISTANT Resistant     ERYTHROMYCIN >=8 RESISTANT Resistant     GENTAMICIN <=0.5 SENSITIVE Sensitive     OXACILLIN >=4 RESISTANT Resistant     TETRACYCLINE <=1 SENSITIVE Sensitive     VANCOMYCIN 1 SENSITIVE Sensitive     TRIMETH/SULFA <=10 SENSITIVE Sensitive  CLINDAMYCIN <=0.25 SENSITIVE Sensitive     RIFAMPIN <=0.5 SENSITIVE Sensitive     Inducible Clindamycin NEGATIVE Sensitive     * ABUNDANT METHICILLIN RESISTANT STAPHYLOCOCCUS AUREUS  CULTURE, BLOOD (ROUTINE X 2) w Reflex to ID Panel     Status: None   Collection Time: 04/08/18 11:43 AM  Result Value Ref Range Status   Specimen Description BLOOD LAC  Final   Special Requests   Final    BOTTLES DRAWN AEROBIC AND ANAEROBIC Blood Culture adequate volume   Culture   Final    NO GROWTH 5 DAYS Performed at [facility], [address] [place], [place] [address]    Report Status 04/13/2018 FINAL  Final  CULTURE, BLOOD (ROUTINE X 2) w Reflex to ID Panel     Status: None   Collection Time: 04/08/18 11:43 AM  Result Value Ref Range Status   Specimen Description BLOOD LT HAND  Final   Special Requests   Final    BOTTLES DRAWN AEROBIC AND ANAEROBIC Blood Culture adequate volume   Culture   Final    NO GROWTH 5 DAYS Performed at [facility], [address] [place], [place] [address]    Report Status 04/13/2018 FINAL  Final  Urine Culture     Status: None   Collection Time:  04/08/18  1:16 PM  Result Value Ref Range Status   Specimen Description   Final    URINE, CATHETERIZED Performed at [facility], [address], [place], [place] [address]    Special Requests   Final    Normal Performed at [facility], [address] [place], [place] [address]    Culture   Final    NO GROWTH Performed at [facility], [address], [place], [place] [address]    Report Status 04/09/2018 FINAL  Final  Urine Culture     Status: None   Collection Time: 04/12/18  6:55 AM  Result Value Ref Range Status   Specimen Description   Final    URINE, RANDOM Performed at [facility], [address], [place], [place] [address]    Special Requests   Final    NONE Performed at [facility], [address] [place], [place] [address]    Culture   Final    NO GROWTH Performed at [facility], [address] [place], [place] [address]    Report Status 04/13/2018 FINAL  Final  CULTURE, BLOOD (ROUTINE X 2) w Reflex to ID Panel     Status: None   Collection Time: 04/12/18  7:30 AM  Result Value Ref Range Status   Specimen Description BLOOD BLOOD LEFT HAND  Final   Special Requests   Final    BOTTLES DRAWN AEROBIC AND ANAEROBIC Blood Culture adequate volume   Culture   Final    NO GROWTH 5 DAYS Performed at [facility], [address] [place], [place] [address]    Report Status 04/17/2018 FINAL  Final  CULTURE, BLOOD (ROUTINE X 2) w Reflex to ID Panel     Status: None   Collection Time: 04/12/18  7:46 AM  Result Value Ref Range Status   Specimen Description BLOOD BLOOD LEFT HAND  Final   Special Requests   Final    BOTTLES DRAWN AEROBIC AND ANAEROBIC Blood Culture adequate volume   Culture   Final    NO GROWTH 5 DAYS Performed at [facility], [address]  [place], [place] [address]    Report Status 04/17/2018 FINAL  Final  Culture, respiratory (NON-Expectorated)     Status: None    Collection Time: 04/12/18  8:11 AM  Result Value Ref Range Status   Specimen Description   Final    TRACHEAL ASPIRATE Performed at [facility], [address] [place], [place] [address]    Special Requests   Final    NONE Performed at [facility], [address] [place], [place] [address]    Gram Stain   Final    FEW WBC PRESENT, PREDOMINANTLY PMN FEW GRAM POSITIVE COCCI Performed at [facility], [address] [place], [place] [address]    Culture FEW METHICILLIN RESISTANT STAPHYLOCOCCUS AUREUS  Final   Report Status 04/15/2018 FINAL  Final   Organism ID, Bacteria METHICILLIN RESISTANT STAPHYLOCOCCUS AUREUS  Final      Susceptibility   Methicillin resistant staphylococcus aureus - MIC*    CIPROFLOXACIN >=8 RESISTANT Resistant     ERYTHROMYCIN >=8 RESISTANT Resistant     GENTAMICIN <=0.5 SENSITIVE Sensitive     OXACILLIN >=4 RESISTANT Resistant     TETRACYCLINE <=1 SENSITIVE Sensitive     VANCOMYCIN 1 SENSITIVE Sensitive     TRIMETH/SULFA <=10 SENSITIVE Sensitive     CLINDAMYCIN <=0.25 SENSITIVE Sensitive     RIFAMPIN <=0.5 SENSITIVE Sensitive     Inducible Clindamycin NEGATIVE Sensitive     * FEW METHICILLIN RESISTANT STAPHYLOCOCCUS AUREUS  C difficile quick scan w PCR reflex     Status: None   Collection Time: 04/12/18  3:05 PM  Result Value Ref Range Status   C Diff antigen NEGATIVE NEGATIVE Final   C Diff toxin NEGATIVE NEGATIVE Final   C Diff interpretation No C. difficile detected.  Final    Comment: Performed at [facility], [address] [place], [place] [address]  Culture, respiratory (NON-Expectorated)     Status: None   Collection Time: 04/18/18  9:37 AM  Result Value Ref Range Status   Specimen Description   Final    TRACHEAL ASPIRATE Performed at [facility], [address] [place], [place] [address]    Special Requests   Final    NONE Performed at [facility], [address]  [place], [place] [address]    Gram Stain   Final    MODERATE WBC PRESENT, PREDOMINANTLY PMN FEW GRAM POSITIVE COCCI IN PAIRS IN CLUSTERS Performed at [facility], [address] [place], [place] [address]    Culture   Final    MODERATE METHICILLIN RESISTANT STAPHYLOCOCCUS AUREUS   Report Status 04/21/2018 FINAL  Final   Organism ID, Bacteria METHICILLIN RESISTANT STAPHYLOCOCCUS AUREUS  Final      Susceptibility   Methicillin resistant staphylococcus aureus - MIC*    CIPROFLOXACIN >=8 RESISTANT Resistant     ERYTHROMYCIN >=8 RESISTANT Resistant     GENTAMICIN <=0.5 SENSITIVE Sensitive     OXACILLIN >=4 RESISTANT Resistant     TETRACYCLINE <=1 SENSITIVE Sensitive     VANCOMYCIN 1 SENSITIVE Sensitive     TRIMETH/SULFA <=10 SENSITIVE Sensitive     CLINDAMYCIN >=8 RESISTANT Resistant     RIFAMPIN <=0.5 SENSITIVE Sensitive     Inducible Clindamycin NEGATIVE Sensitive     * MODERATE METHICILLIN RESISTANT STAPHYLOCOCCUS AUREUS  C difficile quick scan w PCR reflex     Status: None   Collection Time: 04/19/18 11:21 AM  Result Value Ref Range Status   C Diff  antigen NEGATIVE NEGATIVE Final   C Diff toxin NEGATIVE NEGATIVE Final   C Diff interpretation No C. difficile detected.  Final    Comment: Performed at [facility], [address] [place], [place] [address]  C difficile quick scan w PCR reflex     Status: None   Collection Time: 04/24/18  5:30 AM  Result Value Ref Range Status   C Diff antigen NEGATIVE NEGATIVE Final   C Diff toxin NEGATIVE NEGATIVE Final   C Diff interpretation No C. difficile detected.  Final    Comment: Performed at [facility], [address] [place], [place] [address]  CULTURE, BLOOD (ROUTINE X 2) w Reflex to ID Panel     Status: None   Collection Time: 04/27/18 10:49 AM  Result Value Ref Range Status   Specimen Description BLOOD BLOOD LEFT HAND  Final   Special Requests   Final    BOTTLES DRAWN AEROBIC AND ANAEROBIC Blood Culture  adequate volume   Culture   Final    NO GROWTH 5 DAYS Performed at [facility], [address] [place], [place] [address]    Report Status 05/02/2018 FINAL  Final  CULTURE, BLOOD (ROUTINE X 2) w Reflex to ID Panel     Status: None   Collection Time: 04/27/18 11:58 AM  Result Value Ref Range Status   Specimen Description BLOOD BLOOD LEFT HAND  Final   Special Requests   Final    BOTTLES DRAWN AEROBIC AND ANAEROBIC Blood Culture adequate volume   Culture   Final    NO GROWTH 5 DAYS Performed at [facility], [address] [place], [place] [address]    Report Status 05/02/2018 FINAL  Final  Culture, respiratory (NON-Expectorated)     Status: None   Collection Time: 04/27/18 12:00 PM  Result Value Ref Range Status   Specimen Description   Final    TRACHEAL ASPIRATE Performed at [facility], [address] [place], [place] [address]    Special Requests   Final    NONE Performed at [facility], [address] [place], [place] [address]    Gram Stain   Final    ABUNDANT WBC PRESENT, PREDOMINANTLY PMN NO ORGANISMS SEEN Performed at [facility], [address] [place], [place] [address]    Culture FEW METHICILLIN RESISTANT STAPHYLOCOCCUS AUREUS  Final   Report Status 04/30/2018 FINAL  Final   Organism ID, Bacteria METHICILLIN RESISTANT STAPHYLOCOCCUS AUREUS  Final      Susceptibility   Methicillin resistant staphylococcus aureus - MIC*    CIPROFLOXACIN >=8 RESISTANT Resistant     ERYTHROMYCIN >=8 RESISTANT Resistant     GENTAMICIN <=0.5 SENSITIVE Sensitive     OXACILLIN >=4 RESISTANT Resistant     TETRACYCLINE <=1 SENSITIVE Sensitive     VANCOMYCIN 1 SENSITIVE Sensitive     TRIMETH/SULFA <=10 SENSITIVE Sensitive     CLINDAMYCIN <=0.25 SENSITIVE Sensitive     RIFAMPIN <=0.5 SENSITIVE Sensitive     Inducible Clindamycin NEGATIVE Sensitive     * FEW METHICILLIN RESISTANT STAPHYLOCOCCUS AUREUS  Urine Culture     Status: None    Collection Time: 04/27/18  5:27 PM  Result Value Ref Range Status   Specimen Description   Final    URINE, CATHETERIZED Performed at [facility], [address] [place], [place] [address]    Special Requests   Final    NONE Performed at [facility], [address] [place], [place] [address]    Culture   Final    NO GROWTH Performed at [facility], [address], [place], [place] [address]    Report Status 04/29/2018 FINAL  Final    Events:   Studies: Ct Abdomen Wo Contrast  Result Date: 05/01/2018 CLINICAL DATA:  47 y/o  M; evaluate for PEG tube placement. EXAM: CT ABDOMEN WITHOUT CONTRAST TECHNIQUE: Multidetector CT imaging of the abdomen was performed following the standard protocol without IV contrast. COMPARISON:  None. FINDINGS: Lower chest: Cardiomegaly, platelike atelectasis in the lung bases, mitral annular calcification. Hepatobiliary: No focal liver abnormality is seen. No gallstones, gallbladder wall thickening, or biliary dilatation. Pancreas: Unremarkable. No pancreatic ductal dilatation or surrounding inflammatory changes. Spleen: Normal in size without focal abnormality. Adrenals/Urinary Tract: Adrenal glands are unremarkable. Kidneys are normal, without renal calculi, focal lesion, or hydronephrosis. Stomach/Bowel: Stomach is within normal limits. Appendix appears normal. No evidence of bowel wall thickening, distention, or inflammatory changes. The left lobe of the liver extends inferiorly overlapping the majority of the gastric body. There is no colon super position over the stomach at this time. There is a very small area of uncovered stomach in the left anterior axillary line below the ribcage (series 2, image 32). Vascular/Lymphatic: No significant vascular findings are present. No enlarged abdominal or pelvic lymph nodes. Other: No abdominal wall hernia or abnormality. Musculoskeletal: No acute or significant osseous findings.  IMPRESSION: The left lobe of the liver extends inferiorly overlapping the majority of the gastric body. There is no colon superficial to the stomach at this time. There is a very small area of uncovered stomach in the left anterior axillary line below the ribcage. Electronically Signed   By: Mitzi Hansen M.D.   On: 05/01/2018 23:14   Dg Chest 1 View  Result Date: 04/07/2018 CLINICAL DATA:  47 y/o  M; intubated. EXAM: CHEST  1 VIEW COMPARISON:  12/06/2017 chest radiograph FINDINGS: Stable cardiomegaly given projection and technique. Transcutaneous pacing pads. Post median sternotomy with wires in alignment. Low lung volumes. Pulmonary vascular congestion. No focal consolidation. Endotracheal tube tip 2.4 cm above the carina. Enteric tube tip appears to project over the lower esophagus, advancement recommended. IMPRESSION: 1. Endotracheal tube tip 2.4 cm above the carina. 2. Cardiomegaly and pulmonary vascular congestion. 3. Enteric tube tip appears to project over the lower esophagus, advancement recommended. Electronically Signed   By: Mitzi Hansen M.D.   On: 04/07/2018 06:22   Dg Abd 1 View  Result Date: 05/04/2018 CLINICAL DATA:  Preop for gastrostomy tube placement EXAM: ABDOMEN - 1 VIEW COMPARISON:  Abdominal CT from 3 days ago FINDINGS: Nasogastric tube tip over the stomach. Normal bowel gas pattern. Low volume chest with streaky opacities. IMPRESSION: Nasogastric tube with tip over the stomach. Limited abdominal coverage with no dilated bowel seen. Electronically Signed   By: Marnee Spring M.D.   On: 05/04/2018 12:27   Dg Abd 1 View  Result Date: 04/29/2018 CLINICAL DATA:  47 y/o  M; enteric tube placement. EXAM: ABDOMEN - 1 VIEW COMPARISON:  04/25/2018 abdomen radiograph. FINDINGS: Enteric tube tip projects over the gastric body. Post median sternotomy with broken lower wire. Left upper abdomen bowel gases travel. IMPRESSION: Enteric tube tip projects over gastric body.  Electronically Signed   By: Mitzi Hansen M.D.   On: 04/29/2018 16:28   Dg Abd 1 View  Result Date: 04/25/2018 CLINICAL DATA:  Check gastric catheter placement EXAM: ABDOMEN - 1 VIEW COMPARISON:  04/18/2018 FINDINGS: Gastric catheter is noted extending into the stomach. Nonobstructive bowel gas pattern is seen. No other focal abnormality is noted. IMPRESSION: Nasogastric catheter within the stomach. Electronically Signed   By: Alcide Clever M.D.   On: 04/25/2018 09:13   Dg Abd 1 View  Result Date: 04/18/2018 CLINICAL DATA:  Orogastric tube placement EXAM: ABDOMEN - 1 VIEW COMPARISON:  None. FINDINGS: Orogastric tube tip is slightly below the gastroesophageal junction in the proximal gastric cardia. The side port is felt to be above the diaphragm. There is a relative paucity of gas in the visualized abdominal region. No obstruction or free air evident. IMPRESSION: Orogastric tube tip just below the gastroesophageal junction. Advise advancing orogastric tube approximately 8-10 cm to ensure that side-port as well as tube tip are in the stomach. Relative paucity of gas present. This finding may be seen normally but also may be indicative of early ileus or enteritis. Electronically Signed   By: Bretta Bang III M.D.   On: 04/18/2018 11:40   Ct Head Wo Contrast  Result Date: 04/09/2018 CLINICAL DATA:  47 year old with acute mental status changes after cardiac arrest. Possible new onset seizures. EXAM: CT HEAD WITHOUT CONTRAST TECHNIQUE: Contiguous axial images were obtained from the base of the skull through the vertex without intravenous contrast. COMPARISON:  04/07/2018. FINDINGS: Brain: Ventricular system normal in size and appearance for age. Physiologic calcifications in the basal ganglia bilaterally as noted previously. No mass lesion. No midline shift. No acute hemorrhage or hematoma. No extra-axial fluid collections. No evidence of acute infarction. No evidence of cerebral edema at this  time. Vascular: Moderate BILATERAL carotid siphon and vertebrobasilar atherosclerosis. No hyperdense vessel. Skull: No skull fracture or other focal osseous abnormality involving the skull. Sinuses/Orbits: Visualized paranasal sinuses, bilateral mastoid air cells and bilateral middle ear cavities well-aerated. Visualized orbits and globes normal in appearance. Other: None. IMPRESSION: No acute intracranial abnormality. Electronically Signed   By: Hulan Saas M.D.   On: 04/09/2018 11:20   Ct Head Wo Contrast  Result Date: 04/07/2018 CLINICAL DATA:  Cardiac arrest EXAM: CT HEAD WITHOUT CONTRAST TECHNIQUE: Contiguous axial images were obtained from the base of the skull through the vertex without intravenous contrast. COMPARISON:  None. FINDINGS: Brain: No evidence of acute infarction, hemorrhage, hydrocephalus, extra-axial collection or mass lesion/mass effect. Vascular: No hyperdense vessel or unexpected calcification. Skull: Negative Sinuses/Orbits: Negative Other: None IMPRESSION: Negative CT head Electronically Signed   By: Marlan Palau M.D.   On: 04/07/2018 15:25   Ct Chest Wo Contrast  Result Date: 04/18/2018 CLINICAL DATA:  Inpatient. Acute respiratory illness. Desaturation. Intubated. EXAM: CT CHEST WITHOUT CONTRAST TECHNIQUE: Multidetector CT imaging of the chest was performed following the standard protocol without IV contrast. COMPARISON:  Chest radiograph from earlier today. FINDINGS: Examination is significantly limited by patient body habitus and by streak artifact from the patient's upper extremities. Cardiovascular: Mild cardiomegaly. No significant pericardial effusion/thickening. Aortic valve prosthesis is in place. Coronary atherosclerosis. Atherosclerotic thoracic aorta with ectatic 4.3 cm ascending thoracic aorta. Dilated main pulmonary artery (3.9 cm diameter). Mediastinum/Nodes: No discrete thyroid nodules. Enteric tube terminates in the proximal stomach. No pathologically  enlarged axillary, mediastinal or hilar lymph nodes, noting limited sensitivity for the detection of hilar adenopathy on this noncontrast study. Lungs/Pleura: No pneumothorax. No convincing pleural effusions. Endotracheal tube tip is 8.7 cm above the carina. Complete right lower lobe consolidation with scattered air bronchograms and some volume loss. Segmental right middle lobe and medial left lower lobe atelectasis. Patchy  ground-glass opacities in the upper lobes bilaterally. Patchy nodular foci of consolidation in the right upper lobe measuring up to 9 mm (series 3/image 39). Upper abdomen: Diffuse hepatic steatosis. Musculoskeletal: No aggressive appearing focal osseous lesions. Moderate symmetric gynecomastia. Discontinuity in the lower most sternotomy wire. Marked thoracic spondylosis. IMPRESSION: 1. Limited scan. Complete right lower lobe consolidation with scattered air bronchograms and some volume loss. Patchy ground-glass opacities in the bilateral upper lobes. Patchy nodular foci of consolidation in the right upper lobe. Multilobar pneumonia is suspected, with superimposed atelectasis in the right greater than left lungs. 2.  Endotracheal tube tip is 8.7 cm above the carina. 3. Mild cardiomegaly. Dilated main pulmonary artery, suggesting pulmonary arterial hypertension. 4. Ectatic 4.3 cm ascending thoracic aorta. Recommend annual imaging followup by CTA or MRA. This recommendation follows 2010 ACCF/AHA/AATS/ACR/ASA/SCA/SCAI/SIR/STS/SVM Guidelines for the Diagnosis and Management of Patients with Thoracic Aortic Disease. Circulation. 2010; 121: D664-Q034. 5. Diffuse hepatic steatosis. 6. Moderate symmetric gynecomastia. Aortic Atherosclerosis (ICD10-I70.0). Electronically Signed   By: Delbert Phenix M.D.   On: 04/18/2018 10:56   Ir Gastrostomy Tube Mod Sed  Result Date: 05/04/2018 INDICATION: 47 year old with multiple medical problems including history of acute cardiopulmonary arrest. Patient has a  tracheostomy tube and request for gastrostomy tube. GI was unable to safely place a gastrostomy tube with endoscopy. Patient had an abdominal CT that suggested there may be a safe percutaneous window for gastrostomy tube placement. Informed consent was obtained from the patient's wife. EXAM: FLUOROSCOPY TIME FOR GASTROSTOMY TUBE EVALUATION Physician: Rachelle Hora. Henn, MD MEDICATIONS: None ANESTHESIA/SEDATION: None FLUOROSCOPY TIME:  Fluoroscopy Time: 3 minutes and 48 seconds, 650 mGy COMPLICATIONS: None immediate. PROCEDURE: The procedure was explained to the patient. The risks and benefits of the procedure were discussed and the patient' s white questions were addressed. Informed consent was obtained from the patient's wife. Patient was placed supine on the interventional table. The stomach was insufflated with gas through the nasogastric tube. The colon is gas-filled and well visualized. However, there appeared to be redundant loops of colon in the splenic flexure that were projecting over the stomach. Due to patient's morbid obesity, it was difficult to get a true lateral view to assess the exact position of the colon. Based on the fluoroscopic evaluation, a safe percutaneous window could not be identified. Therefore, gastrostomy tube was not attempted. IMPRESSION: Percutaneous gastrostomy tube was not attempted because a safe percutaneous window was not identified. Consider surgical consultation. Electronically Signed   By: Richarda Overlie M.D.   On: 05/04/2018 15:40   US Venous Img Lower Bilateral  Result Date: 04/28/2018 CLINICAL DATA:  Fever, lower extremity edema EXAM: BILATERAL LOWER EXTREMITY VENOUS DOPPLER ULTRASOUND TECHNIQUE: Gray-scale sonography with graded compression, as well as color Doppler and duplex ultrasound were performed to evaluate the lower extremity deep venous systems from the level of the common femoral vein and including the common femoral, femoral, profunda femoral, popliteal and calf  veins including the posterior tibial, peroneal and gastrocnemius veins when visible. The superficial great saphenous vein was also interrogated. Spectral Doppler was utilized to evaluate flow at rest and with distal augmentation maneuvers in the common femoral, femoral and popliteal veins. COMPARISON:  None. FINDINGS: Limited because of body habitus. RIGHT LOWER EXTREMITY Common Femoral Vein: No evidence of thrombus. Normal compressibility, respiratory phasicity and response to augmentation. Saphenofemoral Junction: No evidence of thrombus. Normal compressibility and flow on color Doppler imaging. Profunda Femoral Vein: No evidence of thrombus. Normal compressibility and flow on color Doppler imaging.  Femoral Vein: No evidence of thrombus. Normal compressibility, respiratory phasicity and response to augmentation. Popliteal Vein: No evidence of thrombus. Normal compressibility, respiratory phasicity and response to augmentation. Calf Veins: Limited visualization.  No gross occlusive thrombus. Superficial Great Saphenous Vein: No evidence of thrombus. Normal compressibility. Venous Reflux:  None. Other Findings:  None. LEFT LOWER EXTREMITY Common Femoral Vein: No evidence of thrombus. Normal compressibility, respiratory phasicity and response to augmentation. Saphenofemoral Junction: No evidence of thrombus. Normal compressibility and flow on color Doppler imaging. Profunda Femoral Vein: No evidence of thrombus. Normal compressibility and flow on color Doppler imaging. Femoral Vein: No evidence of thrombus. Normal compressibility, respiratory phasicity and response to augmentation. Popliteal Vein: No evidence of thrombus. Normal compressibility, respiratory phasicity and response to augmentation. Calf Veins: Limited exam. However, the left tibial veins appear patent. But the left peroneal veins are noncompressible with hypoechoic thrombus appearing occlusive. No propagation into the popliteal or femoral veins.  Superficial Great Saphenous Vein: No evidence of thrombus. Normal compressibility. Venous Reflux:  None. Other Findings:  None. IMPRESSION: Limited exam because of body habitus. Positive exam for left calf peroneal DVT without propagation into the femoral or popliteal veins. Very low thrombus burden. No significant right lower extremity DVT. Electronically Signed   By: Judie PetitM.  Shick M.D.   On: 04/28/2018 16:02   Dg Chest Port 1 View  Result Date: 04/28/2018 CLINICAL DATA:  Respiratory failure EXAM: PORTABLE CHEST 1 VIEW COMPARISON:  April 27, 2018 FINDINGS: Tracheostomy catheter tip is 5.1 cm above the carina. Nasogastric tube tip and side port are below the diaphragm. No pneumothorax. There is bibasilar atelectatic change. Lungs elsewhere clear. Heart is mildly enlarged with pulmonary vascularity normal. Patient is status post median sternotomy. No evident bone lesions. No appreciable adenopathy. IMPRESSION: Tube positions as described without pneumothorax. Bibasilar atelectasis. Stable cardiac prominence. Electronically Signed   By: Bretta BangWilliam  Woodruff III M.D.   On: 04/28/2018 13:55   Dg Chest Port 1 View  Result Date: 04/27/2018 CLINICAL DATA:  Respiratory failure EXAM: PORTABLE CHEST 1 VIEW COMPARISON:  Two days ago FINDINGS: Tracheostomy tube in good position. An orogastric tube reaches the stomach. There is artifact from EKG leads. Right upper extremity PICC with tip at the upper cavoatrial junction. Very low lung volumes with diffuse hazy chest opacity. Asymmetric parenchymal opacity on the left. Cardiac enlargement. Prior median sternotomy. No evidence of pneumothorax. IMPRESSION: 1. Stable hardware positioning. 2. Low volume chest with bilateral atelectasis or pneumonia and possible layering effusions. No convincing change from 2 days ago. Electronically Signed   By: Marnee SpringJonathon  Watts M.D.   On: 04/27/2018 07:36   Dg Chest Port 1 View  Result Date: 04/25/2018 CLINICAL DATA:  Check tracheostomy placement  EXAM: PORTABLE CHEST 1 VIEW COMPARISON:  04/24/2018 FINDINGS: Tracheostomy tube is noted in satisfactory position. Nasogastric catheter is noted coursing towards the stomach. The overall inspiratory effort is poor but relatively stable from the previous exam. Patchy changes are again identified in both lungs. Right-sided PICC line is again noted and stable. IMPRESSION: Tubes and lines as described above. Patchy infiltrative changes bilaterally. Electronically Signed   By: Alcide CleverMark  Lukens M.D.   On: 04/25/2018 09:13   Dg Chest Port 1 View  Result Date: 04/24/2018 CLINICAL DATA:  Acute respiratory failure EXAM: PORTABLE CHEST 1 VIEW COMPARISON:  Yesterday FINDINGS: Stable cardiomegaly.  Status post aortic valve replacement. Endotracheal tube tip is at the clavicular heads. An orogastric tube at least reaches the diaphragm, difficult to visualize due to body  habitus. Right upper extremity PICC with tip at the upper cavoatrial junction. Low volume chest with hazy and streaky opacities. IMPRESSION: 1. Unchanged positioning of visible hardware. 2. Cardiomegaly, vascular congestion, and low volumes with atelectasis. Electronically Signed   By: Marnee Spring M.D.   On: 04/24/2018 07:18   Dg Chest Port 1 View  Result Date: 04/23/2018 CLINICAL DATA:  Patient status post respiratory arrest 04/07/2018. EXAM: PORTABLE CHEST 1 VIEW COMPARISON:  Single-view of the chest 04/21/2018 and 04/19/2018. FINDINGS: Endotracheal tube remains in place in good position. There is cardiomegaly and pulmonary edema. No pneumothorax. Likely small to moderate bilateral pleural effusions. IMPRESSION: No change in cardiomegaly and pulmonary edema since the most recent exam. Electronically Signed   By: Drusilla Kanner M.D.   On: 04/23/2018 10:22   Dg Chest Port 1 View  Result Date: 04/21/2018 CLINICAL DATA:  Respiratory failure EXAM: PORTABLE CHEST 1 VIEW COMPARISON:  04/19/2018 FINDINGS: Cardiac shadow is enlarged but stable. Endotracheal  tube and nasogastric catheter are noted and stable. The overall inspiratory effort is again poor. Mild right basilar infiltrative density is seen. Similar changes are noted in the left lung base as well. Mild central vascular congestion is noted. The right-sided pleural effusion is less well visualized likely related to patient positioning. IMPRESSION: Vascular congestion and bibasilar atelectatic changes. Electronically Signed   By: Alcide Clever M.D.   On: 04/21/2018 06:38   Dg Chest Port 1 View  Result Date: 04/19/2018 CLINICAL DATA:  Hypoxia EXAM: PORTABLE CHEST 1 VIEW COMPARISON:  April 18, 2018 FINDINGS: Endotracheal tube tip is 4.0 cm above the carina. Nasogastric tube tip and side port are below the diaphragm. No pneumothorax. There is a right pleural effusion. There is patchy atelectatic change in both lower lobes with consolidation in the right base region. A lesser degree of consolidation is present in the medial left base. Heart is enlarged with pulmonary vascularity currently within normal limits. Patient is status post median sternotomy. No adenopathy. No bone lesions. IMPRESSION: Tube and catheter positions as described without pneumothorax. Persistent right pleural effusion with bibasilar consolidation, more on the right than on the left. There is also bibasilar atelectatic change. No new opacity evident. Electronically Signed   By: Bretta Bang III M.D.   On: 04/19/2018 07:32   Dg Chest Port 1 View  Result Date: 04/18/2018 CLINICAL DATA:  Hypoxia EXAM: PORTABLE CHEST 1 VIEW COMPARISON:  Chest CT April 18, 2018 FINDINGS: Endotracheal tube tip is 4.8 cm above the carina. Orogastric tube tip is below the diaphragm and not seen on this study. No pneumothorax. There is a small right pleural effusion with patchy consolidation in both lower lobes, more on the right than on the left. The lungs elsewhere are clear. 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 No adenopathy evident.  Patient is status post median sternotomy. No bone lesions. IMPRESSION: Tube positions as described without evident pneumothorax. Right pleural effusion with bibasilar consolidation, more on the left than on the right, stable. Underlying pulmonary vascular congestion. Electronically Signed   By: Bretta Bang III M.D.   On: 04/18/2018 11:39   Dg Chest Port 1 View  Result Date: 04/18/2018 CLINICAL DATA:  Acute respiratory distress. EXAM: PORTABLE CHEST 1 VIEW COMPARISON:  16 2019 FINDINGS: Endotracheal tube tip is 6 cm above the carina. Nasogastric tube enters the abdomen. Right subclavian central line tip at the SVC RA junction. Support artifact overlies the chest. Left lung is largely clear, possibly with mild basilar atelectasis. There  is a right effusion more pronounced atelectasis in the right lower. IMPRESSION: Overlying artifact. Atelectasis right worse than left. Right effusion. Electronically Signed   By: Paulina Fusi M.D.   On: 04/18/2018 07:47   Dg Chest Port 1 View  Result Date: 04/17/2018 CLINICAL DATA:  PICC line placement EXAM: PORTABLE CHEST 1 VIEW COMPARISON:  Chest radiograph 04/17/2018 FINDINGS: Right-sided PICC line tip is in the mid SVC. There is a right IJ approach central venous catheter with its tip at the cavoatrial junction, unchanged. Endotracheal tube and gastric tube are unchanged. Lung the mediastinum are unchanged. IMPRESSION: Unchanged examination with right-sided PICC line tip in the mid SVC. Electronically Signed   By: Deatra Robinson M.D.   On: 04/17/2018 17:43   Dg Chest Port 1 View  Result Date: 04/17/2018 CLINICAL DATA:  47 year old male with a history of PICC placement EXAM: PORTABLE CHEST 1 VIEW COMPARISON:  04/17/2018, 04/14/2018 FINDINGS: Study is limited by underpenetration. Redemonstration of surgical changes of median sternotomy. Right IJ central venous catheter is unchanged, with the tip appearing to terminate superior vena cava. Endotracheal tube terminates  4.2 cm above the carina. Gastric tube terminates out of the field of view. Lung volumes remain low with patchy airspace opacities bilaterally. No evidence of left or upper extremity PICC. IMPRESSION: Low lung volumes persist with unchanged appearance of patchy airspace opacities bilaterally. No PICC is visualized. Unchanged position of right IJ central venous catheter, endotracheal tube, gastric tube. Electronically Signed   By: Gilmer Mor D.O.   On: 04/17/2018 15:53   Dg Chest Port 1 View  Result Date: 04/17/2018 CLINICAL DATA:  Increased acute respiratory failure, history of atrial fibrillation and diabetes EXAM: PORTABLE CHEST 1 VIEW COMPARISON:  Portable chest x-ray of 04/14/2018 FINDINGS: The tip of the endotracheal tube is approximately 5.2 cm above the carina. Opacity remains at the right lung base most consistent with atelectasis, but there is more opacity now present at the left lung base. Considerations are that of atelectasis versus pneumonia. No definite effusion is seen. Cardiomegaly is stable. IMPRESSION: 1. Tip of endotracheal tube 5.2 cm above the carina. 2. Diminished aeration with increasing basilar opacities most consistent with atelectasis. Pneumonia cannot be excluded. 3. Stable cardiomegaly. Electronically Signed   By: Dwyane Dee M.D.   On: 04/17/2018 09:10   Dg Chest Port 1 View  Result Date: 04/14/2018 CLINICAL DATA:  Hypoxia EXAM: PORTABLE CHEST 1 VIEW COMPARISON:  April 12, 2018 FINDINGS: Endotracheal tube tip is 2.0 cm above the carina. Nasogastric tube tip and side port are below the diaphragm. Central catheter tip is at the cavoatrial junction. No evident pneumothorax. There is focal consolidation in the right base. Lungs elsewhere appear clear. Note that there is overlying artifact from the cooling mat. Heart is mildly enlarged with pulmonary vascularity normal. No bone lesions. No adenopathy evident. IMPRESSION: Tube and catheter positions as described without pneumothorax.  Consolidation right base, felt to represent focal pneumonia. No similar changes elsewhere. Stable cardiac prominence. Electronically Signed   By: Bretta Bang III M.D.   On: 04/14/2018 09:16   Dg Chest Port 1 View  Result Date: 04/12/2018 CLINICAL DATA:  Dyspnea. Endotracheal tube present. Status post cardiac arrest. EXAM: PORTABLE CHEST 1 VIEW COMPARISON:  04/10/2018 FINDINGS: Endotracheal tube, right jugular central venous catheter, and nasogastric tube remain in appropriate position. Heart size is stable. Low lung volumes are seen. Atelectasis again seen in both lung bases. No new or worsening areas of pulmonary opacity are seen. No  evidence of pulmonary consolidation or pneumothorax. IMPRESSION: No significant change in low lung volumes and bibasilar atelectasis. Electronically Signed   By: Myles Rosenthal M.D.   On: 04/12/2018 10:07   Dg Chest Port 1 View  Result Date: 04/10/2018 CLINICAL DATA:  Acute respiratory failure EXAM: PORTABLE CHEST 1 VIEW COMPARISON:  Earlier today at 1004 hours FINDINGS: Mildly degraded exam due to AP portable technique and patient body habitus. Endotracheal tube terminates 3.3 cm above carina.Prior median sternotomy. Nasogastric tube not well visualized distally but likely extends beyond the inferior aspect of the film. Right internal jugular line tip at low SVC. Cardiomegaly accentuated by AP portable technique. Small bilateral pleural effusions. No pneumothorax. Mild interstitial edema, superimposed upon low lung volumes. Bibasilar airspace disease. IMPRESSION: Decreased sensitivity and specificity exam due to technique related factors, as described above. Cardiomegaly with mild congestive heart failure, low lung volumes, and layering bilateral pleural effusions. Bibasilar airspace disease which is most likely atelectasis. Electronically Signed   By: Jeronimo Greaves M.D.   On: 04/10/2018 19:42   Dg Chest Port 1 View  Result Date: 04/10/2018 CLINICAL DATA:  Hypoxia EXAM:  PORTABLE CHEST 1 VIEW COMPARISON:  April 09, 2018 FINDINGS: Endotracheal tube tip is 4.3 cm above the carina. Central catheter tip is at cavoatrial junction. Nasogastric tube tip and side port below the diaphragm. No pneumothorax. There is patchy airspace consolidation in both medial lung bases with small pleural effusions bilaterally. Lungs elsewhere clear. Heart is mildly enlarged with pulmonary vascularity normal. No adenopathy. Patient is status post median sternotomy. No bone lesions evident. IMPRESSION: Tube and catheter positions as described without pneumothorax. Suspect bibasilar pneumonia. Small pleural effusions. Stable cardiac prominence. Electronically Signed   By: Bretta Bang III M.D.   On: 04/10/2018 10:27   Dg Chest Port 1 View  Result Date: 04/09/2018 CLINICAL DATA:  Pneumonia EXAM: PORTABLE CHEST 1 VIEW COMPARISON:  April 08, 2018 FINDINGS: The ETT is in good position. The distal NG tube terminates below today's film. Stable cardiomegaly. Bibasilar opacities may represent small effusions with underlying atelectasis, stable on the right and mildly increased on the left. The right IJ is stable. No other interval changes. IMPRESSION: 1. Support apparatus as above. 2. Bibasilar opacities may represent small effusions and underlying atelectasis, stable on the right and mildly increased on the left in the interval. Electronically Signed   By: Gerome Sam III M.D   On: 04/09/2018 07:14   Dg Chest Port 1 View  Result Date: 04/08/2018 CLINICAL DATA:  Followup pneumonia. EXAM: PORTABLE CHEST 1 VIEW COMPARISON:  04/07/2018 and older exams. FINDINGS: Lung volumes are low. This accentuates the appearance of vascular congestion and interstitial prominence. There is mild linear atelectasis in the right mid lung, stable from the prior exam. Bilateral lung base opacity is most likely atelectasis. Pneumonia is possible. No overt pulmonary edema. Is Stable changes from prior cardiac surgery. Cardiac  silhouette is mildly enlarged. Endotracheal tube, right internal jugular central venous line and nasal/orogastric tube are stable. IMPRESSION: 1. No significant change from the previous day's study. 2. Vascular congestion with interstitial prominence, but without overt pulmonary edema. 3. Basilar atelectasis.  Cannot exclude pneumonia. Electronically Signed   By: Amie Portland M.D.   On: 04/08/2018 07:23   Dg Chest Portable 1 View  Result Date: 04/07/2018 CLINICAL DATA:  Central catheter placement.  Hypoxia. EXAM: PORTABLE CHEST 1 VIEW COMPARISON:  Apr 07, 2018 study obtained earlier in the day FINDINGS: Right jugular catheter tip is in  the superior vena cava near the cavoatrial junction. Endotracheal tube tip is 4.0 cm above the carina. Nasogastric tube tip and side port are below the diaphragm. No pneumothorax. There is bibasilar atelectasis. Lungs elsewhere are clear. 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 No adenopathy. No bone lesions. Patient is status post median sternotomy with aortic valve replacement. IMPRESSION: Tube and catheter positions as described without pneumothorax. There is pulmonary vascular congestion. Bibasilar atelectasis present. No consolidation evident. Electronically Signed   By: Bretta Bang III M.D.   On: 04/07/2018 08:51   Dg Abd Portable 1v  Result Date: 04/16/2018 CLINICAL DATA:  Orogastric tube placement EXAM: PORTABLE ABDOMEN - 1 VIEW COMPARISON:  April 10, 2018 FINDINGS: Orogastric tube tip is at the gastroesophageal junction. There is a paucity of gas. No free air evident. IMPRESSION: Orogastric tube tip at gastroesophageal junction. Advise advancing tube approximately 10 cm to insure tip and side port in stomach. Paucity of gas is noted. While this finding may be seen normally, it may also be indicative ileus or enteritis. Bowel obstruction not felt to be likely. Visualized lung bases clear. Electronically Signed   By: Bretta Bang III M.D.    On: 04/16/2018 10:54   Dg Abd Portable 1v  Result Date: 04/10/2018 CLINICAL DATA:  Status post OG tube placement. EXAM: PORTABLE ABDOMEN - 1 VIEW COMPARISON:  Single view of the abdomen 04/07/2018. FINDINGS: OG tube tip  and sideport are both in the stomach. IMPRESSION: OG tube in good position. Electronically Signed   By: Drusilla Kanner M.D.   On: 04/10/2018 14:32   Dg Abd Portable 1v  Result Date: 04/07/2018 CLINICAL DATA:  NG placement EXAM: PORTABLE ABDOMEN - 1 VIEW COMPARISON:  None. FINDINGS: Limited exam due to large patient size and difficulty with positioning. NG tip in the proximal stomach. NG side hole in the distal esophagus. No dilated bowel loops. IMPRESSION: NG tube in the proximal stomach with the side hole in the distal esophagus. Recommend advancing at least 5 cm. Electronically Signed   By: Marlan Palau M.D.   On: 04/07/2018 10:48   [place] Ekg Site Rite  Result Date: 04/17/2018 If Site Rite image not attached, placement could not be confirmed due to current cardiac rhythm.   Consults: Treatment Team:  Lamar Blinks, MD Uvaldo Rising, MD Anson Fret, MD Thana Farr, MD Vernie Murders, MD   Subjective:    Overnight Issues: no events noted overnight. Patient has been started on trach collar trials  Objective:  Vital signs for last 24 hours: Temp:  [98.1 F (36.7 C)-98.8 F (37.1 C)] 98.8 F (37.1 C) (06/28 0800) Pulse Rate:  [76-206] 102 (06/28 0800) Resp:  [10-34] 19 (06/28 0800) BP: (96-147)/(57-114) 105/79 (06/28 0800) SpO2:  [89 %-97 %] 96 % (06/28 0800) FiO2 (%):  [40 %] 40 % (06/28 0800)  Hemodynamic parameters for last 24 hours:    Intake/Output from previous day: 06/27 0701 - 06/28 0700 In: 3179.9 [I.V.:1086.2; NG/GT:2093.8] Out: 2800 [Urine:2800]  Intake/Output this shift: Total I/O In: 253 [I.V.:48; NG/GT:205] Out: 325 [Urine:325]  Vent settings for last 24 hours: Vent Mode: PRVC FiO2 (%):  [40 %] 40 % Set Rate:  [16 bmp] 16  bmp Vt Set:  [500 mL] 500 mL PEEP:  [5 cmH20] 5 cmH20 Plateau Pressure:  [22 cmH20] 22 cmH20  Physical Exam:   General: Awake alert and appropriate to all commands Neuro:  eye opening, followed simplecommands HEENT: NCAT, sclerae white Cardiovascular: bradycardia/ tacycardia, no  M Lungs: Few rhonchi, no wheezes , trach site ++ secretions Abdomen: NT, ND, NABS, soft, diarrhoea Ext: 1+ LE edema  Assessment/Plan:   Ventilator dependent respiratory failure. Status post tracheostomy. Patient's FiO2 and PEEP requirements are stable. History of right lower lobe pneumonia, MRSA, completed 10 days of antibiotic therapy. Trying trach collar trials as tolerated.physical therapy, will attempt to get out of bed  History of V. Tach cardiac arrest. Chronic atrial fibrillation with rapid ventricular response, status post mechanical aortic valve replacement. Will continue heparin infusion, patient is on metoprolol and diltiazem for rate control  Renal failure. Nonoliguric. Patient's creatinine is 2.25  Anemia. Mild with no evidence of active bleeding  Left peroneal deep venous thrombosis, presently on systemic anticoagulation  Severe insulin resistance  Presently receiving enteral nutrition. Was unable to place PEG tube secondary to patient's anatomy. Nutrition is following  Critical care time 40 minutes  Alexander Alexander 05/05/2018  *Care during the described time interval was provided by me and/or other providers on the critical care team.  I have reviewed this patient's available data, including medical history, events of note, physical examination and test results as part of my evaluation.

## 2018-05-05 NOTE — Progress Notes (Signed)
Physical Therapy Treatment Patient Details Name: Alexander Alexander MRN: [id number] DOB: [birth year] Today's Date: 05/05/2018    History of Present Illness Pt. is a 47 y.o. male with PMH of atrial fibrillation, status post ablation and aortic valve replacement on long term warfarin, CHF, CKD, diabetes, obstructive sleep apnea and hypertension. Pt. had a pulseless VT cardiac arrest in MRI when he was being evaluated for back pain and sciatica, subsequently intubated in the intensive care unit. Intubated from 5/31-6/18 when trach placed. Pt for PEG placement tomorrow potentially. Of note, pt also with L LE DVT currently on heparin drip diagnosed on 6/21. PEG tube attempted yesterday, however unsuccessful. VItal go tilt bed obtained this date.    PT Comments    Pt is making limited progress towards goals for therapy. Agreeable to tilt bed this session. Order obtained and bed in room. Pt able to tolerate tilt to 23 degrees, however becomes very fearful and complains of pain. Difficulty with communication secondary to trach collar. During tilt, noted B LE foot external rotation with B knee flexion. Able to tolerate each tilt session for a few minutes, however fatigues quickly. At end of session, attempted there-ex on B UE, however unable to keep eyes open. Discussed session with RN. Encourage RN staff to continue with tilt bed and tilt patient to increase and improve tolerance for upright position. Will continue to progress.   Follow Up Recommendations  LTACH     Equipment Recommendations       Recommendations for Other Services       Precautions / Restrictions Precautions Precautions: Fall Restrictions Weight Bearing Restrictions: No    Mobility  Bed Mobility Overal bed mobility: Needs Assistance             General bed mobility comments: needs max assist +2 for scooting up towards HOB. Attempted to place B hands on rails to assist, however unable to maintain  position.  Transfers Overall transfer level: Needs assistance Equipment used: (Tilt bed)             General transfer comment: able to tolerate tilting in bed to 23 degrees twice. Pt currently on trach collar with sats WNL. HR and BP elevated while tilting and pt complains of pain in B feet while on footplate. Able to tolerate for approx 2-3 min each attempt. Pt becomes very anxious at end of session. Further tilts deferred  Ambulation/Gait             General Gait Details: not appropriate at this time   Stairs             Wheelchair Mobility    Modified Rankin (Stroke Patients Only)       Balance                                            Cognition Arousal/Alertness: Lethargic(awake iniititally, however then becomes lethargic) Behavior During Therapy: Anxious Overall Cognitive Status: Difficult to assess                                        Exercises Other Exercises Other Exercises: attempted supine ther-ex, however pt unable to participate due to fatigue and lethargy.    General Comments        Pertinent Vitals/Pain Pain Assessment: Faces  Faces Pain Scale: Hurts little more Pain Location: B LE on tilt foot plate Pain Descriptors / Indicators: Grimacing;Discomfort Pain Intervention(s): Limited activity within patient's tolerance;Repositioned    Home Living                      Prior Function            PT Goals (current goals can now be found in the care plan section) Acute Rehab PT Goals Patient Stated Goal: unable to state PT Goal Formulation: Patient unable to participate in goal setting Time For Goal Achievement: 05/17/18 Potential to Achieve Goals: Fair Progress towards PT goals: Progressing toward goals    Frequency    Min 2X/week      PT Plan Current plan remains appropriate    Co-evaluation              AM-PAC PT "6 Clicks" Daily Activity  Outcome Measure   Difficulty turning over in bed (including adjusting bedclothes, sheets and blankets)?: Unable Difficulty moving from lying on back to sitting on the side of the bed? : Unable Difficulty sitting down on and standing up from a chair with arms (e.g., wheelchair, bedside commode, etc,.)?: Unable Help needed moving to and from a bed to chair (including a wheelchair)?: Total Help needed walking in hospital room?: Total Help needed climbing 3-5 steps with a railing? : Total 6 Click Score: 6    End of Session Equipment Utilized During Treatment: (trach) Activity Tolerance: Patient tolerated treatment well Patient left: in bed;with family/visitor present Nurse Communication: Mobility status PT Visit Diagnosis: Muscle weakness (generalized) (M62.81);Difficulty in walking, not elsewhere classified (R26.2);Other symptoms and signs involving the nervous system (R29.898)     Time: [id number] PT Time Calculation (min) (ACUTE ONLY): 49 min  Charges:  $Therapeutic Activity: 23-37 mins                    G Codes:       Elizabeth PalauStephanie Halo Laski, PT, DPT [phone]    Hollie Wojahn 05/05/2018, 4:24 PM

## 2018-05-05 NOTE — Progress Notes (Signed)
ANTICOAGULATION CONSULT NOTE   Pharmacy Consult for heparin drip management Indication: atrial fibrillation and aortic valve replacement   Allergies  Allergen Reactions  . Lexapro [Escitalopram Oxalate] Swelling    Patient Measurements: Height: 5\' 10"  (177.8 cm) Weight: (bed weight not working ) IBW/kg (Calculated) : 73 Heparin Dosing Weight: 121 kg (updated 6/21)  Vital Signs: Temp: 98.7 F (37.1 C) (06/28 1942) Temp Source: Oral (06/28 1942) BP: 138/96 (06/28 1942) Pulse Rate: 106 (06/28 1942)  Labs: Recent Labs    05/03/18 0424 05/04/18 0525 05/04/18 1046 05/04/18 2251 05/05/18 0343 05/05/18 0757 05/05/18 2146  HGB 11.4*  --   --   --  11.2*  --   --   HCT 34.6*  --   --   --  34.6*  --   --   PLT 231  --   --   --  268  --   --   LABPROT  --  16.1*  --   --   --   --   --   INR  --  1.30  --   --   --   --   --   HEPARINUNFRC 0.48  --   --  0.29*  --  0.75* 0.49  CREATININE 2.63*  --  2.25*  --   --   --   --     Estimated Creatinine Clearance: 64.8 mL/min (A) (by C-G formula based on SCr of 2.25 mg/dL (H)).   Medical History: Past Medical History:  Diagnosis Date  . Allergy   . Anxiety   . Arrhythmia   . Arthritis   . Atrial fibrillation (HCC)   . CHF (congestive heart failure) (HCC)   . CKD (chronic kidney disease)   . Diabetes mellitus without complication (HCC)   . Diabetes mellitus, type II (HCC)   . Heart failure (HCC)   . Hyperlipidemia   . Hypertension   . Pancreatitis   . Sleep apnea     Medications:  Scheduled:  . budesonide (PULMICORT) nebulizer solution  0.25 mg Nebulization Q6H  . chlorhexidine  15 mL Mouth Rinse BID  . clonazePAM  1 mg Per Tube Daily  . clonazePAM  2 mg Oral QHS  . diltiazem  60 mg Oral Q6H  . famotidine  20 mg Per Tube Daily  . fentaNYL  150 mcg Transdermal Q72H  . free water  250 mL Per Tube Q6H  . insulin aspart  0-20 Units Subcutaneous Q4H  . insulin glargine  85 Units Subcutaneous BID  .  ipratropium-albuterol  3 mL Nebulization Q6H  . mouth rinse  15 mL Mouth Rinse q12n4p  . metoprolol tartrate  50 mg Per Tube BID  . QUEtiapine  100 mg Per Tube QHS  . sodium chloride flush  10-40 mL Intracatheter Q12H   Infusions:  . dextrose Stopped (05/05/18 0045)  . feeding supplement (GLUCERNA 1.5 CAL) 1,000 mL (05/05/18 2215)  . heparin 2,300 Units/hr (05/05/18 1900)    Assessment: Pharmacy consulted for warfarin and heparin drip management for 47 yo male admitted to the ICU s/p cardiac arrest. Patient has history significant for aortic valve replacement, atrial fibrillation and CHF. Head CT is negative for infarction or hemorrhage.   Patient takes warfarin 5mg  daily as an outpatient for goal INR 2.5-3.5.    Goal of Therapy:  INR 2-3 Heparin level 0.3-0.7 units/ml Monitor platelets by anticoagulation protocol: Yes   Plan:  Heparin level elevated will decrease heparin to 2300 units/hr.  Will obtain heparin level at 2000.   Pharmacy will continue to monitor and adjust per consult.   6/28:  HL @ 22:00 = 0.49 Will continue this pt on current rate and recheck HL in 6 hrs on 6/29 @ 0400.   Mccauley Diehl D 05/05/2018

## 2018-05-05 NOTE — Progress Notes (Signed)
[facility] - [place] at [facility]   PATIENT NAME: Alexander Alexander    MR#:  [id number]  DATE OF BIRTH:  [birth year]  SUBJECTIVE:  patient resting comfortably.  he is on the ventilator.  REVIEW OF SYSTEMS:  Review of Systems  Unable to perform ROS: Intubated    DRUG ALLERGIES:   Allergies  Allergen Reactions  . Lexapro [Escitalopram Oxalate] Swelling    VITALS:  Blood pressure 129/88, pulse 92, temperature 98.8 F (37.1 C), temperature source Oral, resp. rate (!) 21, height 5\' 10"  (1.778 m), weight (!) 178.7 kg (394 lb), SpO2 95 %.  PHYSICAL EXAMINATION:  GENERAL:  47 y.o.-year-old patient lying in the bed with no acute distress. obese EYES: Pupils equal, round, reactive to light and accommodation. No scleral icterus. Extraocular muscles intact.  HEENT: Head atraumatic, normocephalic. Oropharynx and nasopharynx clear. Trach++ NECK:  Supple, no jugular venous distention. No thyroid enlargement, no tenderness.  LUNGS: Normal breath sounds bilaterally, no wheezing, rales,rhonchi or crepitation. No use of accessory muscles of respiration.  CARDIOVASCULAR: S1, S2 normal. No murmurs, rubs, or gallops.  ABDOMEN: Soft, nontender, nondistended. Bowel sounds present. No organomegaly or mass.  EXTREMITIES: ++ pedal edema, cyanosis, or clubbing.  NEUROLOGIC: moves extremities, no focal weakness PSYCHIATRIC: The patient is alert  SKIN: No obvious rash, lesion, or ulcer.   Physical Exam LABORATORY PANEL:   CBC Recent Labs  Lab 05/05/18 0343  WBC 9.5  HGB 11.2*  HCT 34.6*  PLT 268   ------------------------------------------------------------------------------------------------------------------  Chemistries  Recent Labs  Lab 05/03/18 0424 05/04/18 1046  NA 138 139  K 5.0 5.1  CL 103 104  CO2 26 28  GLUCOSE 197* 87  BUN 68* 65*  CREATININE 2.63* 2.25*  CALCIUM 8.6* 9.0  AST 27  --   ALT 20  --   ALKPHOS 66  --   BILITOT 0.9  --     ------------------------------------------------------------------------------------------------------------------  Cardiac Enzymes Recent Labs  Lab 04/28/18 2055 04/29/18 0218  TROPONINI 0.11* 0.11*   ------------------------------------------------------------------------------------------------------------------  RADIOLOGY:  Dg Abd 1 View  Result Date: 05/04/2018 CLINICAL DATA:  Preop for gastrostomy tube placement EXAM: ABDOMEN - 1 VIEW COMPARISON:  Abdominal CT from 3 days ago FINDINGS: Nasogastric tube tip over the stomach. Normal bowel gas pattern. Low volume chest with streaky opacities. IMPRESSION: Nasogastric tube with tip over the stomach. Limited abdominal coverage with no dilated bowel seen. Electronically Signed   By: Marnee Spring M.D.   On: 05/04/2018 12:27   Ir Gastrostomy Tube Mod Sed  Result Date: 05/04/2018 INDICATION: 47 year old with multiple medical problems including history of acute cardiopulmonary arrest. Patient has a tracheostomy tube and request for gastrostomy tube. GI was unable to safely place a gastrostomy tube with endoscopy. Patient had an abdominal CT that suggested there may be a safe percutaneous window for gastrostomy tube placement. Informed consent was obtained from the patient's wife. EXAM: FLUOROSCOPY TIME FOR GASTROSTOMY TUBE EVALUATION Physician: Rachelle Hora. Henn, MD MEDICATIONS: None ANESTHESIA/SEDATION: None FLUOROSCOPY TIME:  Fluoroscopy Time: 3 minutes and 48 seconds, 650 mGy COMPLICATIONS: None immediate. PROCEDURE: The procedure was explained to the patient. The risks and benefits of the procedure were discussed and the patient' s white questions were addressed. Informed consent was obtained from the patient's wife. Patient was placed supine on the interventional table. The stomach was insufflated with gas through the nasogastric tube. The colon is gas-filled and well visualized. However, there appeared to be redundant loops of colon in the  splenic flexure that  were projecting over the stomach. Due to patient's morbid obesity, it was difficult to get a true lateral view to assess the exact position of the colon. Based on the fluoroscopic evaluation, a safe percutaneous window could not be identified. Therefore, gastrostomy tube was not attempted. IMPRESSION: Percutaneous gastrostomy tube was not attempted because a safe percutaneous window was not identified. Consider surgical consultation. Electronically Signed   By: Richarda OverlieAdam  Henn M.D.   On: 05/04/2018 15:40    ASSESSMENT AND PLAN:  47 year old male with past medical history significant for A. fib status post ablation, aortic valve replacement on warfarin, history of congestive heart failure, CKD, morbid obesity, diabetes and sleep apnea who was getting an outpatient MRI of his lower back had a cardiac arrest.  *Acute cardiopulmonary arrest From respiratory causes Difficult weaning from vent,s/p tracheostomy Continuemechanical ventilation with weaning as tolerated, for Pegtube placement today with IR unsuccessful since safe window not found. Recommends surgical consultation.  *AcuteMRSA/VAP Treated withcourse of Linezolid Continue trach collar/vent protocol   *Atrial fibrillationwith RVR Continuemetoprolol,Cardizem, amiodarone, continue Heparin drip.  *History of mechanical aortic valve replacement Onheparin drip  *Acute toxic metabolic encephalopathy  Neurology input appreciated  Continue Kepprafor possible partial seizures  *Chronic diabetes mellitus type 2 Stable on current regiment  *acute renal failurew/ CKD stage III Secondary toATN  *Acute septic shock, MRSA pneumonia Resolved Plan to complete 14 days linezolid  *F/E/N ForPEG tube placementby IR---unsuccessful. Await further plans for nutrition  Disposition to LTAC when able to  All the records are reviewed and case discussed with Care Management/Social Workerr. Management plans  discussed with the patient, family and they are in agreement.  CODE STATUS: full  TOTAL TIME TAKING CARE OF THIS PATIENT: 25 minutes.     POSSIBLE D/C INfew DAYS, DEPENDING ON CLINICAL CONDITION.   Enedina FinnerSona Dalin Caldera M.D on 05/05/2018   Between 7am to 6pm - Pager - [phone]  After 6pm go to [email or web address] - password [email or web address]  [facility]  Office  [phone]  CC: Primary care physician; Erasmo DownerBacigalupo, Angela M, MD  Note: This dictation was prepared with Dragon dictation along with smaller phrase technology. Any transcriptional errors that result from this process are unintentional.

## 2018-05-06 ENCOUNTER — Inpatient Hospital Stay: Payer: Medicare HMO

## 2018-05-06 LAB — CBC
HCT: 35.7 % — ABNORMAL LOW (ref 40.0–52.0)
HEMOGLOBIN: 11.6 g/dL — AB (ref 13.0–18.0)
MCH: 27.6 pg (ref 26.0–34.0)
MCHC: 32.7 g/dL (ref 32.0–36.0)
MCV: 84.5 fL (ref 80.0–100.0)
Platelets: 280 10*3/uL (ref 150–440)
RBC: 4.22 MIL/uL — AB (ref 4.40–5.90)
RDW: 19.1 % — ABNORMAL HIGH (ref 11.5–14.5)
WBC: 9.3 10*3/uL (ref 3.8–10.6)

## 2018-05-06 LAB — BASIC METABOLIC PANEL
ANION GAP: 7 (ref 5–15)
Anion gap: 9 (ref 5–15)
BUN: 62 mg/dL — ABNORMAL HIGH (ref 6–20)
BUN: 60 mg/dL — AB (ref 6–20)
CALCIUM: 9 mg/dL (ref 8.9–10.3)
CALCIUM: 9.5 mg/dL (ref 8.9–10.3)
CO2: 29 mmol/L (ref 22–32)
CO2: 29 mmol/L (ref 22–32)
Chloride: 100 mmol/L (ref 98–111)
Chloride: 101 mmol/L (ref 98–111)
Creatinine, Ser: 2.2 mg/dL — ABNORMAL HIGH (ref 0.61–1.24)
Creatinine, Ser: 2.14 mg/dL — ABNORMAL HIGH (ref 0.61–1.24)
GFR calc Af Amer: 40 mL/min — ABNORMAL LOW (ref >60)
GFR calc non Af Amer: 33 mL/min — ABNORMAL LOW (ref >60)
GFR, EST AFRICAN AMERICAN: 39 mL/min — AB (ref >60)
GFR, EST NON AFRICAN AMERICAN: 35 mL/min — AB (ref >60)
Glucose, Bld: 171 mg/dL — ABNORMAL HIGH (ref 70–99)
Glucose, Bld: 152 mg/dL — ABNORMAL HIGH (ref 70–99)
POTASSIUM: 5.5 mmol/L — AB (ref 3.5–5.1)
Potassium: 5.3 mmol/L — ABNORMAL HIGH (ref 3.5–5.1)
SODIUM: 136 mmol/L (ref 135–145)
SODIUM: 139 mmol/L (ref 135–145)

## 2018-05-06 LAB — GLUCOSE, CAPILLARY
GLUCOSE-CAPILLARY: 169 mg/dL — AB (ref 70–99)
GLUCOSE-CAPILLARY: 164 mg/dL — AB (ref 70–99)
GLUCOSE-CAPILLARY: 131 mg/dL — AB (ref 70–99)
Glucose-Capillary: 151 mg/dL — ABNORMAL HIGH (ref 70–99)
Glucose-Capillary: 157 mg/dL — ABNORMAL HIGH (ref 70–99)
Glucose-Capillary: 94 mg/dL (ref 70–99)

## 2018-05-06 LAB — HEPARIN LEVEL (UNFRACTIONATED): HEPARIN UNFRACTIONATED: 0.55 [IU]/mL (ref 0.30–0.70)

## 2018-05-06 LAB — PHOSPHORUS: Phosphorus: 3.7 mg/dL (ref 2.5–4.6)

## 2018-05-06 LAB — MAGNESIUM: Magnesium: 2.1 mg/dL (ref 1.7–2.4)

## 2018-05-06 MED ORDER — FENTANYL CITRATE (PF) 100 MCG/2ML IJ SOLN
25.0000 ug | Freq: Four times a day (QID) | INTRAMUSCULAR | Status: DC | PRN
  Administered 2018-05-06 – 2018-05-20 (×15): 25 ug via INTRAVENOUS
  Filled 2018-05-06 (×15): qty 2

## 2018-05-06 MED ORDER — ALBUTEROL SULFATE (2.5 MG/3ML) 0.083% IN NEBU
2.5000 mg | INHALATION_SOLUTION | Freq: Once | RESPIRATORY_TRACT | Status: AC
  Administered 2018-05-06: 2.5 mg via RESPIRATORY_TRACT
  Filled 2018-05-06: qty 3

## 2018-05-06 MED ORDER — HEPARIN (PORCINE) IN NACL 100-0.45 UNIT/ML-% IJ SOLN
2050.0000 [IU]/h | INTRAMUSCULAR | Status: DC
  Administered 2018-05-06 – 2018-05-07 (×4): 2300 [IU]/h via INTRAVENOUS
  Administered 2018-05-08: 2100 [IU]/h via INTRAVENOUS
  Administered 2018-05-08 – 2018-05-10 (×3): 1900 [IU]/h via INTRAVENOUS
  Administered 2018-05-11: 1500 [IU]/h via INTRAVENOUS
  Administered 2018-05-11: 1700 [IU]/h via INTRAVENOUS
  Administered 2018-05-12 – 2018-05-15 (×4): 1650 [IU]/h via INTRAVENOUS
  Administered 2018-05-15 – 2018-05-16 (×2): 1700 [IU]/h via INTRAVENOUS
  Administered 2018-05-17 – 2018-05-23 (×9): 2000 [IU]/h via INTRAVENOUS
  Administered 2018-05-23 – 2018-05-24 (×3): 2050 [IU]/h via INTRAVENOUS
  Filled 2018-05-06 (×31): qty 250

## 2018-05-06 MED ORDER — ORAL CARE MOUTH RINSE
15.0000 mL | OROMUCOSAL | Status: DC
  Administered 2018-05-06 – 2018-05-20 (×92): 15 mL via OROMUCOSAL

## 2018-05-06 NOTE — Progress Notes (Signed)
Follow up - Critical Care Medicine Note  Patient Details:    Alexander Alexander is an 47 y.o. male.with atrial fibrillation, status post ablation and aortic valve replacement on long term warfarin, CHF, CKD, diabetes, obstructive sleep apnea and hypertension, suffered a pulseless VT cardiac arrest in MRI when he was being evaluated for back pain and sciatica, subsequently intubated in the intensive care unit. Difficult airway noted    Lines, Airways, Drains: PICC Triple Lumen 04/17/18 PICC Right Cephalic 39 cm 0 cm (Active)  Indication for Insertion or Continuance of Line Prolonged intravenous therapies 05/05/2018  7:51 AM  Exposed Catheter (cm) 0 cm 05/01/2018  5:37 PM  Site Assessment Clean;Dry;Intact 05/04/2018  7:27 PM  Lumen #1 Status Infusing;Flushed;Blood return noted 05/04/2018  7:27 PM  Lumen #2 Status Infusing;Flushed;Blood return noted 05/04/2018  7:27 PM  Lumen #3 Status Flushed;Saline locked;Blood return noted 05/04/2018  7:27 PM  Dressing Type Transparent 05/04/2018  7:27 PM  Dressing Status Antimicrobial disc in place;Intact;Dry;Clean 05/04/2018  7:27 PM  Line Care Connections checked and tightened 05/04/2018  7:27 PM  Dressing Intervention Other (Comment) 05/04/2018  7:27 PM  Dressing Change Due 05/11/18 05/04/2018  7:27 PM     NG/OG Tube Nasogastric Right nare Xray Documented cm marking at nare/ corner of mouth 65 cm (Active)  Cm Marking at Nare/Corner of Mouth (if applicable) 65 cm 05/04/2018  7:40 PM  Site Assessment Clean;Dry;Intact 05/05/2018  8:00 AM  Ongoing Placement Verification No acute changes, not attributed to clinical condition;No change in respiratory status;No change in cm markings or external length of tube from initial placement 05/05/2018  8:00 AM  Status Infusing tube feed 05/05/2018  8:00 AM  Intake (mL) 55 mL 05/05/2018  8:00 AM  Output (mL) 100 mL 05/03/2018 12:00 AM     Urethral Catheter C Green, Rn Double-lumen 14 Fr. (Active)  Indication for Insertion or  Continuance of Catheter Chronic catheter use 05/05/2018  8:00 AM  Site Assessment Clean;Intact 05/04/2018  7:27 PM  Catheter Maintenance Bag below level of bladder;Catheter secured;Drainage bag/tubing not touching floor;Insertion date on drainage bag;No dependent loops;Seal intact 05/05/2018  8:00 AM  Collection Container Standard drainage bag 05/04/2018  7:27 PM  Securement Method Other (Comment) 05/04/2018  7:27 PM  Urinary Catheter Interventions Unclamped 05/04/2018  7:27 PM  Output (mL) 325 mL 05/05/2018  8:00 AM    Anti-infectives:  Anti-infectives (From admission, onward)   Start     Dose/Rate Route Frequency Ordered Stop   04/28/18 1100  ceFAZolin (ANCEF) 3 g in dextrose 5 % 50 mL IVPB     3 g 100 mL/hr over 30 Minutes Intravenous  Once 04/28/18 0058 04/28/18 1058   04/19/18 1000  linezolid (ZYVOX) IVPB 600 mg  Status:  Discontinued     600 mg 300 mL/hr over 60 Minutes Intravenous Every 12 hours 04/19/18 0826 04/28/18 1011   04/18/18 1330  ceFEPIme (MAXIPIME) 2 g in sodium chloride 0.9 % 100 mL IVPB  Status:  Discontinued     2 g 200 mL/hr over 30 Minutes Intravenous Every 12 hours 04/18/18 1322 04/21/18 1108   04/18/18 0800  vancomycin (VANCOCIN) 1,250 mg in sodium chloride 0.9 % 250 mL IVPB  Status:  Discontinued     1,250 mg 166.7 mL/hr over 90 Minutes Intravenous Every 24 hours 04/18/18 0621 04/19/18 0826   04/17/18 0630  vancomycin (VANCOCIN) 1,250 mg in sodium chloride 0.9 % 250 mL IVPB     1,250 mg 166.7 mL/hr over 90 Minutes Intravenous  Once 04/17/18 0618 04/17/18 0855   04/14/18 1430  vancomycin (VANCOCIN) IVPB 1000 mg/200 mL premix  Status:  Discontinued     1,000 mg 200 mL/hr over 60 Minutes Intravenous Every 12 hours 04/14/18 1348 04/16/18 1450   04/11/18 2000  vancomycin (VANCOCIN) 1,250 mg in sodium chloride 0.9 % 250 mL IVPB  Status:  Discontinued     1,250 mg 166.7 mL/hr over 90 Minutes Intravenous Every 18 hours 04/11/18 1548 04/14/18 1348   04/10/18 1400   vancomycin (VANCOCIN) 1,250 mg in sodium chloride 0.9 % 250 mL IVPB  Status:  Discontinued     1,250 mg 166.7 mL/hr over 90 Minutes Intravenous Every 12 hours 04/10/18 1149 04/11/18 1548   04/10/18 1200  Ampicillin-Sulbactam (UNASYN) 3 g in sodium chloride 0.9 % 100 mL IVPB  Status:  Discontinued     3 g 200 mL/hr over 30 Minutes Intravenous Every 6 hours 04/10/18 0851 04/10/18 1131   04/09/18 1800  vancomycin (VANCOCIN) 1,500 mg in sodium chloride 0.9 % 500 mL IVPB  Status:  Discontinued     1,500 mg 250 mL/hr over 120 Minutes Intravenous Every 18 hours 04/09/18 1303 04/10/18 0750   04/09/18 1045  vancomycin (VANCOCIN) IVPB 1000 mg/200 mL premix     1,000 mg 200 mL/hr over 60 Minutes Intravenous  Once 04/09/18 1041 04/09/18 1307   04/07/18 1400  piperacillin-tazobactam (ZOSYN) IVPB 3.375 g  Status:  Discontinued     3.375 g 12.5 mL/hr over 240 Minutes Intravenous Every 8 hours 04/07/18 1015 04/10/18 0851      Microbiology: Results for orders placed or performed during the hospital encounter of 04/07/18  MRSA PCR Screening     Status: None   Collection Time: 04/07/18  9:49 AM  Result Value Ref Range Status   MRSA by PCR NEGATIVE NEGATIVE Final    Comment:        The GeneXpert MRSA Assay (FDA approved for NASAL specimens only), is one component of a comprehensive MRSA colonization surveillance program. It is not intended to diagnose MRSA infection nor to guide or monitor treatment for MRSA infections. Performed at [facility], [address] [place], [place] [address]   Culture, respiratory (NON-Expectorated)     Status: None   Collection Time: 04/08/18 10:53 AM  Result Value Ref Range Status   Specimen Description   Final    TRACHEAL ASPIRATE Performed at [facility], [address] [place], [place] [address]    Special Requests   Final    Normal Performed at [facility], [address] [place], [place] [address]    Gram Stain   Final     ABUNDANT WBC PRESENT, PREDOMINANTLY PMN NO SQUAMOUS EPITHELIAL CELLS SEEN MODERATE GRAM POSITIVE COCCI IN CLUSTERS FEW GRAM NEGATIVE COCCOBACILLI RARE GRAM POSITIVE RODS Performed at [facility], [address] [place], [place] [address]    Culture   Final    ABUNDANT METHICILLIN RESISTANT STAPHYLOCOCCUS AUREUS   Report Status 04/10/2018 FINAL  Final   Organism ID, Bacteria METHICILLIN RESISTANT STAPHYLOCOCCUS AUREUS  Final      Susceptibility   Methicillin resistant staphylococcus aureus - MIC*    CIPROFLOXACIN >=8 RESISTANT Resistant     ERYTHROMYCIN >=8 RESISTANT Resistant     GENTAMICIN <=0.5 SENSITIVE Sensitive     OXACILLIN >=4 RESISTANT Resistant     TETRACYCLINE <=1 SENSITIVE Sensitive     VANCOMYCIN 1 SENSITIVE Sensitive     TRIMETH/SULFA <=10 SENSITIVE Sensitive  CLINDAMYCIN <=0.25 SENSITIVE Sensitive     RIFAMPIN <=0.5 SENSITIVE Sensitive     Inducible Clindamycin NEGATIVE Sensitive     * ABUNDANT METHICILLIN RESISTANT STAPHYLOCOCCUS AUREUS  CULTURE, BLOOD (ROUTINE X 2) w Reflex to ID Panel     Status: None   Collection Time: 04/08/18 11:43 AM  Result Value Ref Range Status   Specimen Description BLOOD LAC  Final   Special Requests   Final    BOTTLES DRAWN AEROBIC AND ANAEROBIC Blood Culture adequate volume   Culture   Final    NO GROWTH 5 DAYS Performed at [facility], [address] [place], [place] [address]    Report Status 04/13/2018 FINAL  Final  CULTURE, BLOOD (ROUTINE X 2) w Reflex to ID Panel     Status: None   Collection Time: 04/08/18 11:43 AM  Result Value Ref Range Status   Specimen Description BLOOD LT HAND  Final   Special Requests   Final    BOTTLES DRAWN AEROBIC AND ANAEROBIC Blood Culture adequate volume   Culture   Final    NO GROWTH 5 DAYS Performed at [facility], [address] [place], [place] [address]    Report Status 04/13/2018 FINAL  Final  Urine Culture     Status: None   Collection Time:  04/08/18  1:16 PM  Result Value Ref Range Status   Specimen Description   Final    URINE, CATHETERIZED Performed at [facility], [address] [place], [place] [address]    Special Requests   Final    Normal Performed at [facility], [address], [place], [place] [address]    Culture   Final    NO GROWTH Performed at [facility], [address], [place], [place] [address]    Report Status 04/09/2018 FINAL  Final  Urine Culture     Status: None   Collection Time: 04/12/18  6:55 AM  Result Value Ref Range Status   Specimen Description   Final    URINE, RANDOM Performed at [facility], [address], [place], [place] [address]    Special Requests   Final    NONE Performed at [facility], [address], [place], [place] [address]    Culture   Final    NO GROWTH Performed at [facility], [address], [place], [place] [address]    Report Status 04/13/2018 FINAL  Final  CULTURE, BLOOD (ROUTINE X 2) w Reflex to ID Panel     Status: None   Collection Time: 04/12/18  7:30 AM  Result Value Ref Range Status   Specimen Description BLOOD BLOOD LEFT HAND  Final   Special Requests   Final    BOTTLES DRAWN AEROBIC AND ANAEROBIC Blood Culture adequate volume   Culture   Final    NO GROWTH 5 DAYS Performed at [facility], [address] [place], [place] [address]    Report Status 04/17/2018 FINAL  Final  CULTURE, BLOOD (ROUTINE X 2) w Reflex to ID Panel     Status: None   Collection Time: 04/12/18  7:46 AM  Result Value Ref Range Status   Specimen Description BLOOD BLOOD LEFT HAND  Final   Special Requests   Final    BOTTLES DRAWN AEROBIC AND ANAEROBIC Blood Culture adequate volume   Culture   Final    NO GROWTH 5 DAYS Performed at [facility], [address]  [place], [place] [address]    Report Status 04/17/2018 FINAL  Final  Culture, respiratory (NON-Expectorated)     Status: None    Collection Time: 04/12/18  8:11 AM  Result Value Ref Range Status   Specimen Description   Final    TRACHEAL ASPIRATE Performed at [facility], [address], [place], [place] [address]    Special Requests   Final    NONE Performed at [facility], [address] [place], [place] [address]    Gram Stain   Final    FEW WBC PRESENT, PREDOMINANTLY PMN FEW GRAM POSITIVE COCCI Performed at [facility], [address], [place], [place] [address]    Culture FEW METHICILLIN RESISTANT STAPHYLOCOCCUS AUREUS  Final   Report Status 04/15/2018 FINAL  Final   Organism ID, Bacteria METHICILLIN RESISTANT STAPHYLOCOCCUS AUREUS  Final      Susceptibility   Methicillin resistant staphylococcus aureus - MIC*    CIPROFLOXACIN >=8 RESISTANT Resistant     ERYTHROMYCIN >=8 RESISTANT Resistant     GENTAMICIN <=0.5 SENSITIVE Sensitive     OXACILLIN >=4 RESISTANT Resistant     TETRACYCLINE <=1 SENSITIVE Sensitive     VANCOMYCIN 1 SENSITIVE Sensitive     TRIMETH/SULFA <=10 SENSITIVE Sensitive     CLINDAMYCIN <=0.25 SENSITIVE Sensitive     RIFAMPIN <=0.5 SENSITIVE Sensitive     Inducible Clindamycin NEGATIVE Sensitive     * FEW METHICILLIN RESISTANT STAPHYLOCOCCUS AUREUS  C difficile quick scan w PCR reflex     Status: None   Collection Time: 04/12/18  3:05 PM  Result Value Ref Range Status   C Diff antigen NEGATIVE NEGATIVE Final   C Diff toxin NEGATIVE NEGATIVE Final   C Diff interpretation No C. difficile detected.  Final    Comment: Performed at [facility], [address] [place], [place] [address]  Culture, respiratory (NON-Expectorated)     Status: None   Collection Time: 04/18/18  9:37 AM  Result Value Ref Range Status   Specimen Description   Final    TRACHEAL ASPIRATE Performed at [facility], [address] [place], [place] [address]    Special Requests   Final    NONE Performed at [facility], [address]  [place], [place] [address]    Gram Stain   Final    MODERATE WBC PRESENT, PREDOMINANTLY PMN FEW GRAM POSITIVE COCCI IN PAIRS IN CLUSTERS Performed at [facility], [address] [place], [place] [address]    Culture   Final    MODERATE METHICILLIN RESISTANT STAPHYLOCOCCUS AUREUS   Report Status 04/21/2018 FINAL  Final   Organism ID, Bacteria METHICILLIN RESISTANT STAPHYLOCOCCUS AUREUS  Final      Susceptibility   Methicillin resistant staphylococcus aureus - MIC*    CIPROFLOXACIN >=8 RESISTANT Resistant     ERYTHROMYCIN >=8 RESISTANT Resistant     GENTAMICIN <=0.5 SENSITIVE Sensitive     OXACILLIN >=4 RESISTANT Resistant     TETRACYCLINE <=1 SENSITIVE Sensitive     VANCOMYCIN 1 SENSITIVE Sensitive     TRIMETH/SULFA <=10 SENSITIVE Sensitive     CLINDAMYCIN >=8 RESISTANT Resistant     RIFAMPIN <=0.5 SENSITIVE Sensitive     Inducible Clindamycin NEGATIVE Sensitive     * MODERATE METHICILLIN RESISTANT STAPHYLOCOCCUS AUREUS  C difficile quick scan w PCR reflex     Status: None   Collection Time: 04/19/18 11:21 AM  Result Value Ref Range Status   C Diff  antigen NEGATIVE NEGATIVE Final   C Diff toxin NEGATIVE NEGATIVE Final   C Diff interpretation No C. difficile detected.  Final    Comment: Performed at [facility], [address] [place], [place] [address]  C difficile quick scan w PCR reflex     Status: None   Collection Time: 04/24/18  5:30 AM  Result Value Ref Range Status   C Diff antigen NEGATIVE NEGATIVE Final   C Diff toxin NEGATIVE NEGATIVE Final   C Diff interpretation No C. difficile detected.  Final    Comment: Performed at [facility], [address] [place], [place] [address]  CULTURE, BLOOD (ROUTINE X 2) w Reflex to ID Panel     Status: None   Collection Time: 04/27/18 10:49 AM  Result Value Ref Range Status   Specimen Description BLOOD BLOOD LEFT HAND  Final   Special Requests   Final    BOTTLES DRAWN AEROBIC AND ANAEROBIC Blood Culture  adequate volume   Culture   Final    NO GROWTH 5 DAYS Performed at [facility], [address] [place], [place] [address]    Report Status 05/02/2018 FINAL  Final  CULTURE, BLOOD (ROUTINE X 2) w Reflex to ID Panel     Status: None   Collection Time: 04/27/18 11:58 AM  Result Value Ref Range Status   Specimen Description BLOOD BLOOD LEFT HAND  Final   Special Requests   Final    BOTTLES DRAWN AEROBIC AND ANAEROBIC Blood Culture adequate volume   Culture   Final    NO GROWTH 5 DAYS Performed at [facility], [address] [place], [place] [address]    Report Status 05/02/2018 FINAL  Final  Culture, respiratory (NON-Expectorated)     Status: None   Collection Time: 04/27/18 12:00 PM  Result Value Ref Range Status   Specimen Description   Final    TRACHEAL ASPIRATE Performed at [facility], [address] [place], [place] [address]    Special Requests   Final    NONE Performed at [facility], [address] [place], [place] [address]    Gram Stain   Final    ABUNDANT WBC PRESENT, PREDOMINANTLY PMN NO ORGANISMS SEEN Performed at [facility], [address] [place], [place] [address]    Culture FEW METHICILLIN RESISTANT STAPHYLOCOCCUS AUREUS  Final   Report Status 04/30/2018 FINAL  Final   Organism ID, Bacteria METHICILLIN RESISTANT STAPHYLOCOCCUS AUREUS  Final      Susceptibility   Methicillin resistant staphylococcus aureus - MIC*    CIPROFLOXACIN >=8 RESISTANT Resistant     ERYTHROMYCIN >=8 RESISTANT Resistant     GENTAMICIN <=0.5 SENSITIVE Sensitive     OXACILLIN >=4 RESISTANT Resistant     TETRACYCLINE <=1 SENSITIVE Sensitive     VANCOMYCIN 1 SENSITIVE Sensitive     TRIMETH/SULFA <=10 SENSITIVE Sensitive     CLINDAMYCIN <=0.25 SENSITIVE Sensitive     RIFAMPIN <=0.5 SENSITIVE Sensitive     Inducible Clindamycin NEGATIVE Sensitive     * FEW METHICILLIN RESISTANT STAPHYLOCOCCUS AUREUS  Urine Culture     Status: None    Collection Time: 04/27/18  5:27 PM  Result Value Ref Range Status   Specimen Description   Final    URINE, CATHETERIZED Performed at [facility], [address] [place], [place] [address]    Special Requests   Final    NONE Performed at [facility], [address] [place], [place] [address]    Culture   Final    NO GROWTH Performed at [facility], [address] [place], [place] [address]    Report Status 04/29/2018 FINAL  Final    Events: patient had some difficulty last night with possible mucus plugging. Presently resting comfortably back on mechanical ventilation   Studies: Ct Abdomen Wo Contrast  Result Date: 05/01/2018 CLINICAL DATA:  47 y/o  M; evaluate for PEG tube placement. EXAM: CT ABDOMEN WITHOUT CONTRAST TECHNIQUE: Multidetector CT imaging of the abdomen was performed following the standard protocol without IV contrast. COMPARISON:  None. FINDINGS: Lower chest: Cardiomegaly, platelike atelectasis in the lung bases, mitral annular calcification. Hepatobiliary: No focal liver abnormality is seen. No gallstones, gallbladder wall thickening, or biliary dilatation. Pancreas: Unremarkable. No pancreatic ductal dilatation or surrounding inflammatory changes. Spleen: Normal in size without focal abnormality. Adrenals/Urinary Tract: Adrenal glands are unremarkable. Kidneys are normal, without renal calculi, focal lesion, or hydronephrosis. Stomach/Bowel: Stomach is within normal limits. Appendix appears normal. No evidence of bowel wall thickening, distention, or inflammatory changes. The left lobe of the liver extends inferiorly overlapping the majority of the gastric body. There is no colon super position over the stomach at this time. There is a very small area of uncovered stomach in the left anterior axillary line below the ribcage (series 2, image 32). Vascular/Lymphatic: No significant vascular findings are present. No enlarged abdominal or pelvic  lymph nodes. Other: No abdominal wall hernia or abnormality. Musculoskeletal: No acute or significant osseous findings. IMPRESSION: The left lobe of the liver extends inferiorly overlapping the majority of the gastric body. There is no colon superficial to the stomach at this time. There is a very small area of uncovered stomach in the left anterior axillary line below the ribcage. Electronically Signed   By: Mitzi Hansen M.D.   On: 05/01/2018 23:14   Dg Chest 1 View  Result Date: 04/07/2018 CLINICAL DATA:  47 y/o  M; intubated. EXAM: CHEST  1 VIEW COMPARISON:  12/06/2017 chest radiograph FINDINGS: Stable cardiomegaly given projection and technique. Transcutaneous pacing pads. Post median sternotomy with wires in alignment. Low lung volumes. Pulmonary vascular congestion. No focal consolidation. Endotracheal tube tip 2.4 cm above the carina. Enteric tube tip appears to project over the lower esophagus, advancement recommended. IMPRESSION: 1. Endotracheal tube tip 2.4 cm above the carina. 2. Cardiomegaly and pulmonary vascular congestion. 3. Enteric tube tip appears to project over the lower esophagus, advancement recommended. Electronically Signed   By: Mitzi Hansen M.D.   On: 04/07/2018 06:22   Dg Abd 1 View  Result Date: 05/04/2018 CLINICAL DATA:  Preop for gastrostomy tube placement EXAM: ABDOMEN - 1 VIEW COMPARISON:  Abdominal CT from 3 days ago FINDINGS: Nasogastric tube tip over the stomach. Normal bowel gas pattern. Low volume chest with streaky opacities. IMPRESSION: Nasogastric tube with tip over the stomach. Limited abdominal coverage with no dilated bowel seen. Electronically Signed   By: Marnee Spring M.D.   On: 05/04/2018 12:27   Dg Abd 1 View  Result Date: 04/29/2018 CLINICAL DATA:  47 y/o  M; enteric tube placement. EXAM: ABDOMEN - 1 VIEW COMPARISON:  04/25/2018 abdomen radiograph. FINDINGS: Enteric tube tip projects over the gastric body. Post median sternotomy  with broken lower wire. Left upper abdomen bowel gases travel. IMPRESSION: Enteric tube tip projects over gastric body. Electronically Signed   By: Mitzi Hansen M.D.   On: 04/29/2018 16:28   Dg Abd 1 View  Result Date: 04/25/2018 CLINICAL DATA:  Check gastric catheter placement EXAM: ABDOMEN - 1 VIEW COMPARISON:  04/18/2018 FINDINGS: Gastric catheter is noted extending into the stomach. Nonobstructive bowel gas pattern is seen. No other focal abnormality is noted. IMPRESSION: Nasogastric catheter within the stomach. Electronically Signed   By: Alcide Clever M.D.   On: 04/25/2018 09:13   Dg Abd 1 View  Result Date: 04/18/2018 CLINICAL DATA:  Orogastric tube placement EXAM: ABDOMEN - 1 VIEW COMPARISON:  None. FINDINGS: Orogastric tube tip is slightly below the gastroesophageal junction in the proximal gastric cardia. The side port is felt to be above the diaphragm. There is a relative paucity of gas in the visualized abdominal region. No obstruction or free air evident. IMPRESSION: Orogastric tube tip just below the gastroesophageal junction. Advise advancing orogastric tube approximately 8-10 cm to ensure that side-port as well as tube tip are in the stomach. Relative paucity of gas present. This finding may be seen normally but also may be indicative of early ileus or enteritis. Electronically Signed   By: Bretta Bang III M.D.   On: 04/18/2018 11:40   Ct Head Wo Contrast  Result Date: 04/09/2018 CLINICAL DATA:  47 year old with acute mental status changes after cardiac arrest. Possible new onset seizures. EXAM: CT HEAD WITHOUT CONTRAST TECHNIQUE: Contiguous axial images were obtained from the base of the skull through the vertex without intravenous contrast. COMPARISON:  04/07/2018. FINDINGS: Brain: Ventricular system normal in size and appearance for age. Physiologic calcifications in the basal ganglia bilaterally as noted previously. No mass lesion. No midline shift. No acute hemorrhage  or hematoma. No extra-axial fluid collections. No evidence of acute infarction. No evidence of cerebral edema at this time. Vascular: Moderate BILATERAL carotid siphon and vertebrobasilar atherosclerosis. No hyperdense vessel. Skull: No skull fracture or other focal osseous abnormality involving the skull. Sinuses/Orbits: Visualized paranasal sinuses, bilateral mastoid air cells and bilateral middle ear cavities well-aerated. Visualized orbits and globes normal in appearance. Other: None. IMPRESSION: No acute intracranial abnormality. Electronically Signed   By: Hulan Saas M.D.   On: 04/09/2018 11:20   Ct Head Wo Contrast  Result Date: 04/07/2018 CLINICAL DATA:  Cardiac arrest EXAM: CT HEAD WITHOUT CONTRAST TECHNIQUE: Contiguous axial images were obtained from the base of the skull through the vertex without intravenous contrast. COMPARISON:  None. FINDINGS: Brain: No evidence of acute infarction, hemorrhage, hydrocephalus, extra-axial collection or mass lesion/mass effect. Vascular: No hyperdense vessel or unexpected calcification. Skull: Negative Sinuses/Orbits: Negative Other: None IMPRESSION: Negative CT head Electronically Signed   By: Marlan Palau M.D.   On: 04/07/2018 15:25   Ct Chest Wo Contrast  Result Date: 04/18/2018 CLINICAL DATA:  Inpatient. Acute respiratory illness. Desaturation. Intubated. EXAM: CT CHEST WITHOUT CONTRAST TECHNIQUE: Multidetector CT imaging of the chest was performed following the standard protocol without IV contrast. COMPARISON:  Chest radiograph from earlier today. FINDINGS: Examination is significantly limited by patient body habitus and by streak artifact from the patient's upper extremities. Cardiovascular: Mild cardiomegaly. No significant pericardial effusion/thickening. Aortic valve prosthesis is in place. Coronary atherosclerosis. Atherosclerotic thoracic aorta with ectatic 4.3 cm ascending thoracic aorta. Dilated main pulmonary artery (3.9 cm diameter).  Mediastinum/Nodes: No discrete thyroid nodules. Enteric tube terminates in the proximal stomach. No pathologically enlarged axillary, mediastinal or hilar lymph nodes, noting limited sensitivity for the detection of hilar adenopathy on this noncontrast study. Lungs/Pleura: No pneumothorax. No convincing pleural effusions. Endotracheal tube tip is 8.7 cm above the carina. Complete right lower lobe consolidation with scattered  air bronchograms and some volume loss. Segmental right middle lobe and medial left lower lobe atelectasis. Patchy ground-glass opacities in the upper lobes bilaterally. Patchy nodular foci of consolidation in the right upper lobe measuring up to 9 mm (series 3/image 39). Upper abdomen: Diffuse hepatic steatosis. Musculoskeletal: No aggressive appearing focal osseous lesions. Moderate symmetric gynecomastia. Discontinuity in the lower most sternotomy wire. Marked thoracic spondylosis. IMPRESSION: 1. Limited scan. Complete right lower lobe consolidation with scattered air bronchograms and some volume loss. Patchy ground-glass opacities in the bilateral upper lobes. Patchy nodular foci of consolidation in the right upper lobe. Multilobar pneumonia is suspected, with superimposed atelectasis in the right greater than left lungs. 2.  Endotracheal tube tip is 8.7 cm above the carina. 3. Mild cardiomegaly. Dilated main pulmonary artery, suggesting pulmonary arterial hypertension. 4. Ectatic 4.3 cm ascending thoracic aorta. Recommend annual imaging followup by CTA or MRA. This recommendation follows 2010 ACCF/AHA/AATS/ACR/ASA/SCA/SCAI/SIR/STS/SVM Guidelines for the Diagnosis and Management of Patients with Thoracic Aortic Disease. Circulation. 2010; 121: N629-B284. 5. Diffuse hepatic steatosis. 6. Moderate symmetric gynecomastia. Aortic Atherosclerosis (ICD10-I70.0). Electronically Signed   By: Delbert Phenix M.D.   On: 04/18/2018 10:56   Ir Gastrostomy Tube Mod Sed  Result Date:  05/04/2018 INDICATION: 47 year old with multiple medical problems including history of acute cardiopulmonary arrest. Patient has a tracheostomy tube and request for gastrostomy tube. GI was unable to safely place a gastrostomy tube with endoscopy. Patient had an abdominal CT that suggested there may be a safe percutaneous window for gastrostomy tube placement. Informed consent was obtained from the patient's wife. EXAM: FLUOROSCOPY TIME FOR GASTROSTOMY TUBE EVALUATION Physician: Rachelle Hora. Henn, MD MEDICATIONS: None ANESTHESIA/SEDATION: None FLUOROSCOPY TIME:  Fluoroscopy Time: 3 minutes and 48 seconds, 650 mGy COMPLICATIONS: None immediate. PROCEDURE: The procedure was explained to the patient. The risks and benefits of the procedure were discussed and the patient' s white questions were addressed. Informed consent was obtained from the patient's wife. Patient was placed supine on the interventional table. The stomach was insufflated with gas through the nasogastric tube. The colon is gas-filled and well visualized. However, there appeared to be redundant loops of colon in the splenic flexure that were projecting over the stomach. Due to patient's morbid obesity, it was difficult to get a true lateral view to assess the exact position of the colon. Based on the fluoroscopic evaluation, a safe percutaneous window could not be identified. Therefore, gastrostomy tube was not attempted. IMPRESSION: Percutaneous gastrostomy tube was not attempted because a safe percutaneous window was not identified. Consider surgical consultation. Electronically Signed   By: Richarda Overlie M.D.   On: 05/04/2018 15:40   US Venous Img Lower Bilateral  Result Date: 04/28/2018 CLINICAL DATA:  Fever, lower extremity edema EXAM: BILATERAL LOWER EXTREMITY VENOUS DOPPLER ULTRASOUND TECHNIQUE: Gray-scale sonography with graded compression, as well as color Doppler and duplex ultrasound were performed to evaluate the lower extremity deep venous  systems from the level of the common femoral vein and including the common femoral, femoral, profunda femoral, popliteal and calf veins including the posterior tibial, peroneal and gastrocnemius veins when visible. The superficial great saphenous vein was also interrogated. Spectral Doppler was utilized to evaluate flow at rest and with distal augmentation maneuvers in the common femoral, femoral and popliteal veins. COMPARISON:  None. FINDINGS: Limited because of body habitus. RIGHT LOWER EXTREMITY Common Femoral Vein: No evidence of thrombus. Normal compressibility, respiratory phasicity and response to augmentation. Saphenofemoral Junction: No evidence of thrombus. Normal compressibility and flow on color  Doppler imaging. Profunda Femoral Vein: No evidence of thrombus. Normal compressibility and flow on color Doppler imaging. Femoral Vein: No evidence of thrombus. Normal compressibility, respiratory phasicity and response to augmentation. Popliteal Vein: No evidence of thrombus. Normal compressibility, respiratory phasicity and response to augmentation. Calf Veins: Limited visualization.  No gross occlusive thrombus. Superficial Great Saphenous Vein: No evidence of thrombus. Normal compressibility. Venous Reflux:  None. Other Findings:  None. LEFT LOWER EXTREMITY Common Femoral Vein: No evidence of thrombus. Normal compressibility, respiratory phasicity and response to augmentation. Saphenofemoral Junction: No evidence of thrombus. Normal compressibility and flow on color Doppler imaging. Profunda Femoral Vein: No evidence of thrombus. Normal compressibility and flow on color Doppler imaging. Femoral Vein: No evidence of thrombus. Normal compressibility, respiratory phasicity and response to augmentation. Popliteal Vein: No evidence of thrombus. Normal compressibility, respiratory phasicity and response to augmentation. Calf Veins: Limited exam. However, the left tibial veins appear patent. But the left peroneal  veins are noncompressible with hypoechoic thrombus appearing occlusive. No propagation into the popliteal or femoral veins. Superficial Great Saphenous Vein: No evidence of thrombus. Normal compressibility. Venous Reflux:  None. Other Findings:  None. IMPRESSION: Limited exam because of body habitus. Positive exam for left calf peroneal DVT without propagation into the femoral or popliteal veins. Very low thrombus burden. No significant right lower extremity DVT. Electronically Signed   By: Judie Petit.  Shick M.D.   On: 04/28/2018 16:02   Dg Chest Port 1 View  Result Date: 05/06/2018 CLINICAL DATA:  Acute respiratory failure. EXAM: PORTABLE CHEST 1 VIEW COMPARISON:  04/28/2018. FINDINGS: The tracheostomy tube remains in satisfactory position. Nasogastric tube in place, not clearly visible distally due to overlying density. Right PICC tip in the superior vena cava 4 cm above the superior cavoatrial junction. 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 No significant change in linear density in both lower lung zones and prominent pulmonary vasculature. Unremarkable bones. IMPRESSION: Stable cardiomegaly, pulmonary vascular congestion and bibasilar atelectasis. Electronically Signed   By: Beckie Salts M.D.   On: 05/06/2018 08:13   Dg Chest Port 1 View  Result Date: 04/28/2018 CLINICAL DATA:  Respiratory failure EXAM: PORTABLE CHEST 1 VIEW COMPARISON:  April 27, 2018 FINDINGS: Tracheostomy catheter tip is 5.1 cm above the carina. Nasogastric tube tip and side port are below the diaphragm. No pneumothorax. There is bibasilar atelectatic change. Lungs elsewhere clear. Heart is mildly enlarged with pulmonary vascularity normal. Patient is status post median sternotomy. No evident bone lesions. No appreciable adenopathy. IMPRESSION: Tube positions as described without pneumothorax. Bibasilar atelectasis. Stable cardiac prominence. Electronically Signed   By: Bretta Bang III M.D.   On: 04/28/2018 13:55   Dg Chest  Port 1 View  Result Date: 04/27/2018 CLINICAL DATA:  Respiratory failure EXAM: PORTABLE CHEST 1 VIEW COMPARISON:  Two days ago FINDINGS: Tracheostomy tube in good position. An orogastric tube reaches the stomach. There is artifact from EKG leads. Right upper extremity PICC with tip at the upper cavoatrial junction. Very low lung volumes with diffuse hazy chest opacity. Asymmetric parenchymal opacity on the left. Cardiac enlargement. Prior median sternotomy. No evidence of pneumothorax. IMPRESSION: 1. Stable hardware positioning. 2. Low volume chest with bilateral atelectasis or pneumonia and possible layering effusions. No convincing change from 2 days ago. Electronically Signed   By: Marnee Spring M.D.   On: 04/27/2018 07:36   Dg Chest Port 1 View  Result Date: 04/25/2018 CLINICAL DATA:  Check tracheostomy placement EXAM: PORTABLE CHEST 1 VIEW COMPARISON:  04/24/2018 FINDINGS:  Tracheostomy tube is noted in satisfactory position. Nasogastric catheter is noted coursing towards the stomach. The overall inspiratory effort is poor but relatively stable from the previous exam. Patchy changes are again identified in both lungs. Right-sided PICC line is again noted and stable. IMPRESSION: Tubes and lines as described above. Patchy infiltrative changes bilaterally. Electronically Signed   By: Alcide Clever M.D.   On: 04/25/2018 09:13   Dg Chest Port 1 View  Result Date: 04/24/2018 CLINICAL DATA:  Acute respiratory failure EXAM: PORTABLE CHEST 1 VIEW COMPARISON:  Yesterday FINDINGS: Stable cardiomegaly.  Status post aortic valve replacement. Endotracheal tube tip is at the clavicular heads. An orogastric tube at least reaches the diaphragm, difficult to visualize due to body habitus. Right upper extremity PICC with tip at the upper cavoatrial junction. Low volume chest with hazy and streaky opacities. IMPRESSION: 1. Unchanged positioning of visible hardware. 2. Cardiomegaly, vascular congestion, and low volumes  with atelectasis. Electronically Signed   By: Marnee Spring M.D.   On: 04/24/2018 07:18   Dg Chest Port 1 View  Result Date: 04/23/2018 CLINICAL DATA:  Patient status post respiratory arrest 04/07/2018. EXAM: PORTABLE CHEST 1 VIEW COMPARISON:  Single-view of the chest 04/21/2018 and 04/19/2018. FINDINGS: Endotracheal tube remains in place in good position. There is cardiomegaly and pulmonary edema. No pneumothorax. Likely small to moderate bilateral pleural effusions. IMPRESSION: No change in cardiomegaly and pulmonary edema since the most recent exam. Electronically Signed   By: Drusilla Kanner M.D.   On: 04/23/2018 10:22   Dg Chest Port 1 View  Result Date: 04/21/2018 CLINICAL DATA:  Respiratory failure EXAM: PORTABLE CHEST 1 VIEW COMPARISON:  04/19/2018 FINDINGS: Cardiac shadow is enlarged but stable. Endotracheal tube and nasogastric catheter are noted and stable. The overall inspiratory effort is again poor. Mild right basilar infiltrative density is seen. Similar changes are noted in the left lung base as well. Mild central vascular congestion is noted. The right-sided pleural effusion is less well visualized likely related to patient positioning. IMPRESSION: Vascular congestion and bibasilar atelectatic changes. Electronically Signed   By: Alcide Clever M.D.   On: 04/21/2018 06:38   Dg Chest Port 1 View  Result Date: 04/19/2018 CLINICAL DATA:  Hypoxia EXAM: PORTABLE CHEST 1 VIEW COMPARISON:  April 18, 2018 FINDINGS: Endotracheal tube tip is 4.0 cm above the carina. Nasogastric tube tip and side port are below the diaphragm. No pneumothorax. There is a right pleural effusion. There is patchy atelectatic change in both lower lobes with consolidation in the right base region. A lesser degree of consolidation is present in the medial left base. Heart is enlarged with pulmonary vascularity currently within normal limits. Patient is status post median sternotomy. No adenopathy. No bone lesions.  IMPRESSION: Tube and catheter positions as described without pneumothorax. Persistent right pleural effusion with bibasilar consolidation, more on the right than on the left. There is also bibasilar atelectatic change. No new opacity evident. Electronically Signed   By: Bretta Bang III M.D.   On: 04/19/2018 07:32   Dg Chest Port 1 View  Result Date: 04/18/2018 CLINICAL DATA:  Hypoxia EXAM: PORTABLE CHEST 1 VIEW COMPARISON:  Chest CT April 18, 2018 FINDINGS: Endotracheal tube tip is 4.8 cm above the carina. Orogastric tube tip is below the diaphragm and not seen on this study. No pneumothorax. There is a small right pleural effusion with patchy consolidation in both lower lobes, more on the right than on the left. The lungs elsewhere are clear. There is cardiomegaly  with pulmonary venous hypertension. No adenopathy evident. Patient is status post median sternotomy. No bone lesions. IMPRESSION: Tube positions as described without evident pneumothorax. Right pleural effusion with bibasilar consolidation, more on the left than on the right, stable. Underlying pulmonary vascular congestion. Electronically Signed   By: Bretta Bang III M.D.   On: 04/18/2018 11:39   Dg Chest Port 1 View  Result Date: 04/18/2018 CLINICAL DATA:  Acute respiratory distress. EXAM: PORTABLE CHEST 1 VIEW COMPARISON:  16 2019 FINDINGS: Endotracheal tube tip is 6 cm above the carina. Nasogastric tube enters the abdomen. Right subclavian central line tip at the SVC RA junction. Support artifact overlies the chest. Left lung is largely clear, possibly with mild basilar atelectasis. There is a right effusion more pronounced atelectasis in the right lower. IMPRESSION: Overlying artifact. Atelectasis right worse than left. Right effusion. Electronically Signed   By: Paulina Fusi M.D.   On: 04/18/2018 07:47   Dg Chest Port 1 View  Result Date: 04/17/2018 CLINICAL DATA:  PICC line placement EXAM: PORTABLE CHEST 1 VIEW COMPARISON:   Chest radiograph 04/17/2018 FINDINGS: Right-sided PICC line tip is in the mid SVC. There is a right IJ approach central venous catheter with its tip at the cavoatrial junction, unchanged. Endotracheal tube and gastric tube are unchanged. Lung the mediastinum are unchanged. IMPRESSION: Unchanged examination with right-sided PICC line tip in the mid SVC. Electronically Signed   By: Deatra Robinson M.D.   On: 04/17/2018 17:43   Dg Chest Port 1 View  Result Date: 04/17/2018 CLINICAL DATA:  46 year old male with a history of PICC placement EXAM: PORTABLE CHEST 1 VIEW COMPARISON:  04/17/2018, 04/14/2018 FINDINGS: Study is limited by underpenetration. Redemonstration of surgical changes of median sternotomy. Right IJ central venous catheter is unchanged, with the tip appearing to terminate superior vena cava. Endotracheal tube terminates 4.2 cm above the carina. Gastric tube terminates out of the field of view. Lung volumes remain low with patchy airspace opacities bilaterally. No evidence of left or upper extremity PICC. IMPRESSION: Low lung volumes persist with unchanged appearance of patchy airspace opacities bilaterally. No PICC is visualized. Unchanged position of right IJ central venous catheter, endotracheal tube, gastric tube. Electronically Signed   By: Gilmer Mor D.O.   On: 04/17/2018 15:53   Dg Chest Port 1 View  Result Date: 04/17/2018 CLINICAL DATA:  Increased acute respiratory failure, history of atrial fibrillation and diabetes EXAM: PORTABLE CHEST 1 VIEW COMPARISON:  Portable chest x-ray of 04/14/2018 FINDINGS: The tip of the endotracheal tube is approximately 5.2 cm above the carina. Opacity remains at the right lung base most consistent with atelectasis, but there is more opacity now present at the left lung base. Considerations are that of atelectasis versus pneumonia. No definite effusion is seen. Cardiomegaly is stable. IMPRESSION: 1. Tip of endotracheal tube 5.2 cm above the carina. 2.  Diminished aeration with increasing basilar opacities most consistent with atelectasis. Pneumonia cannot be excluded. 3. Stable cardiomegaly. Electronically Signed   By: Dwyane Dee M.D.   On: 04/17/2018 09:10   Dg Chest Port 1 View  Result Date: 04/14/2018 CLINICAL DATA:  Hypoxia EXAM: PORTABLE CHEST 1 VIEW COMPARISON:  April 12, 2018 FINDINGS: Endotracheal tube tip is 2.0 cm above the carina. Nasogastric tube tip and side port are below the diaphragm. Central catheter tip is at the cavoatrial junction. No evident pneumothorax. There is focal consolidation in the right base. Lungs elsewhere appear clear. Note that there is overlying artifact from the cooling mat.  Heart is mildly enlarged with pulmonary vascularity normal. No bone lesions. No adenopathy evident. IMPRESSION: Tube and catheter positions as described without pneumothorax. Consolidation right base, felt to represent focal pneumonia. No similar changes elsewhere. Stable cardiac prominence. Electronically Signed   By: Bretta Bang III M.D.   On: 04/14/2018 09:16   Dg Chest Port 1 View  Result Date: 04/12/2018 CLINICAL DATA:  Dyspnea. Endotracheal tube present. Status post cardiac arrest. EXAM: PORTABLE CHEST 1 VIEW COMPARISON:  04/10/2018 FINDINGS: Endotracheal tube, right jugular central venous catheter, and nasogastric tube remain in appropriate position. Heart size is stable. Low lung volumes are seen. Atelectasis again seen in both lung bases. No new or worsening areas of pulmonary opacity are seen. No evidence of pulmonary consolidation or pneumothorax. IMPRESSION: No significant change in low lung volumes and bibasilar atelectasis. Electronically Signed   By: Myles Rosenthal M.D.   On: 04/12/2018 10:07   Dg Chest Port 1 View  Result Date: 04/10/2018 CLINICAL DATA:  Acute respiratory failure EXAM: PORTABLE CHEST 1 VIEW COMPARISON:  Earlier today at 1004 hours FINDINGS: Mildly degraded exam due to AP portable technique and patient body  habitus. Endotracheal tube terminates 3.3 cm above carina.Prior median sternotomy. Nasogastric tube not well visualized distally but likely extends beyond the inferior aspect of the film. Right internal jugular line tip at low SVC. Cardiomegaly accentuated by AP portable technique. Small bilateral pleural effusions. No pneumothorax. Mild interstitial edema, superimposed upon low lung volumes. Bibasilar airspace disease. IMPRESSION: Decreased sensitivity and specificity exam due to technique related factors, as described above. Cardiomegaly with mild congestive heart failure, low lung volumes, and layering bilateral pleural effusions. Bibasilar airspace disease which is most likely atelectasis. Electronically Signed   By: Jeronimo Greaves M.D.   On: 04/10/2018 19:42   Dg Chest Port 1 View  Result Date: 04/10/2018 CLINICAL DATA:  Hypoxia EXAM: PORTABLE CHEST 1 VIEW COMPARISON:  April 09, 2018 FINDINGS: Endotracheal tube tip is 4.3 cm above the carina. Central catheter tip is at cavoatrial junction. Nasogastric tube tip and side port below the diaphragm. No pneumothorax. There is patchy airspace consolidation in both medial lung bases with small pleural effusions bilaterally. Lungs elsewhere clear. Heart is mildly enlarged with pulmonary vascularity normal. No adenopathy. Patient is status post median sternotomy. No bone lesions evident. IMPRESSION: Tube and catheter positions as described without pneumothorax. Suspect bibasilar pneumonia. Small pleural effusions. Stable cardiac prominence. Electronically Signed   By: Bretta Bang III M.D.   On: 04/10/2018 10:27   Dg Chest Port 1 View  Result Date: 04/09/2018 CLINICAL DATA:  Pneumonia EXAM: PORTABLE CHEST 1 VIEW COMPARISON:  April 08, 2018 FINDINGS: The ETT is in good position. The distal NG tube terminates below today's film. Stable cardiomegaly. Bibasilar opacities may represent small effusions with underlying atelectasis, stable on the right and mildly increased  on the left. The right IJ is stable. No other interval changes. IMPRESSION: 1. Support apparatus as above. 2. Bibasilar opacities may represent small effusions and underlying atelectasis, stable on the right and mildly increased on the left in the interval. Electronically Signed   By: Gerome Sam III M.D   On: 04/09/2018 07:14   Dg Chest Port 1 View  Result Date: 04/08/2018 CLINICAL DATA:  Followup pneumonia. EXAM: PORTABLE CHEST 1 VIEW COMPARISON:  04/07/2018 and older exams. FINDINGS: Lung volumes are low. This accentuates the appearance of vascular congestion and interstitial prominence. There is mild linear atelectasis in the right mid lung, stable from the prior  exam. Bilateral lung base opacity is most likely atelectasis. Pneumonia is possible. No overt pulmonary edema. Is Stable changes from prior cardiac surgery. Cardiac silhouette is mildly enlarged. Endotracheal tube, right internal jugular central venous line and nasal/orogastric tube are stable. IMPRESSION: 1. No significant change from the previous day's study. 2. Vascular congestion with interstitial prominence, but without overt pulmonary edema. 3. Basilar atelectasis.  Cannot exclude pneumonia. Electronically Signed   By: Amie Portland M.D.   On: 04/08/2018 07:23   Dg Chest Portable 1 View  Result Date: 04/07/2018 CLINICAL DATA:  Central catheter placement.  Hypoxia. EXAM: PORTABLE CHEST 1 VIEW COMPARISON:  Apr 07, 2018 study obtained earlier in the day FINDINGS: Right jugular catheter tip is in the superior vena cava near the cavoatrial junction. Endotracheal tube tip is 4.0 cm above the carina. Nasogastric tube tip and side port are below the diaphragm. No pneumothorax. There is bibasilar atelectasis. Lungs elsewhere are clear. 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 No adenopathy. No bone lesions. Patient is status post median sternotomy with aortic valve replacement. IMPRESSION: Tube and catheter positions as  described without pneumothorax. There is pulmonary vascular congestion. Bibasilar atelectasis present. No consolidation evident. Electronically Signed   By: Bretta Bang III M.D.   On: 04/07/2018 08:51   Dg Abd Portable 1v  Result Date: 04/16/2018 CLINICAL DATA:  Orogastric tube placement EXAM: PORTABLE ABDOMEN - 1 VIEW COMPARISON:  April 10, 2018 FINDINGS: Orogastric tube tip is at the gastroesophageal junction. There is a paucity of gas. No free air evident. IMPRESSION: Orogastric tube tip at gastroesophageal junction. Advise advancing tube approximately 10 cm to insure tip and side port in stomach. Paucity of gas is noted. While this finding may be seen normally, it may also be indicative ileus or enteritis. Bowel obstruction not felt to be likely. Visualized lung bases clear. Electronically Signed   By: Bretta Bang III M.D.   On: 04/16/2018 10:54   Dg Abd Portable 1v  Result Date: 04/10/2018 CLINICAL DATA:  Status post OG tube placement. EXAM: PORTABLE ABDOMEN - 1 VIEW COMPARISON:  Single view of the abdomen 04/07/2018. FINDINGS: OG tube tip  and sideport are both in the stomach. IMPRESSION: OG tube in good position. Electronically Signed   By: Drusilla Kanner M.D.   On: 04/10/2018 14:32   Dg Abd Portable 1v  Result Date: 04/07/2018 CLINICAL DATA:  NG placement EXAM: PORTABLE ABDOMEN - 1 VIEW COMPARISON:  None. FINDINGS: Limited exam due to large patient size and difficulty with positioning. NG tip in the proximal stomach. NG side hole in the distal esophagus. No dilated bowel loops. IMPRESSION: NG tube in the proximal stomach with the side hole in the distal esophagus. Recommend advancing at least 5 cm. Electronically Signed   By: Marlan Palau M.D.   On: 04/07/2018 10:48   [place] Ekg Site Rite  Result Date: 04/17/2018 If Site Rite image not attached, placement could not be confirmed due to current cardiac rhythm.   Consults: Treatment Team:  Lamar Blinks, MD Uvaldo Rising,  MD Anson Fret, MD Thana Farr, MD Vernie Murders, MD   Subjective:    Overnight Issues: possible mucus plugging overnight  Objective:  Vital signs for last 24 hours: Temp:  [98.3 F (36.8 C)-99.2 F (37.3 C)] 99 F (37.2 C) (06/29 0300) Pulse Rate:  [76-210] 96 (06/29 0700) Resp:  [16-44] 18 (06/29 0700) BP: (106-175)/(66-128) 120/94 (06/29 0700) SpO2:  [84 %-97 %] 95 % (06/29 0700) FiO2 (%):  [  40 %] 40 % (06/29 0542) Weight:  [392 lb 6.7 oz (178 kg)] 392 lb 6.7 oz (178 kg) (06/29 0500)  Hemodynamic parameters for last 24 hours:    Intake/Output from previous day: 06/28 0701 - 06/29 0700 In: 2127.9 [I.V.:582.9; NG/GT:1545] Out: 2200 [Urine:2200]  Intake/Output this shift: No intake/output data recorded.  Vent settings for last 24 hours: Vent Mode: PSV FiO2 (%):  [40 %] 40 % PEEP:  [5 cmH20] 5 cmH20 Pressure Support:  [5 cmH20] 5 cmH20 Plateau Pressure:  [3 cmH20] 3 cmH20  Physical Exam:   General: Awake alert and appropriate to all commands Neuro:  eye opening, followed simplecommands HEENT: NCAT, sclerae white Cardiovascular: bradycardia/ tacycardia, no M Lungs: Few rhonchi, no wheezes , trach site ++ secretions Abdomen: NT, ND, NABS, soft, diarrhoea Ext: 1+ LE edema  Assessment/Plan:   Ventilator dependent respiratory failure. Status post tracheostomy. Patient's FiO2 and PEEP requirements are stable. History of right lower lobe pneumonia, MRSA, completed 10 days of antibiotic therapy. Trying trach collar trials as tolerated.physical therapy. Chest x-ray did not reveal any evidence of lobar collapse  History of V. Tach cardiac arrest. Chronic atrial fibrillation with rapid ventricular response, status post mechanical aortic valve replacement. Will continue heparin infusion, patient is on metoprolol and diltiazem for rate control  Renal failure. Nonoliguric. Patient's creatinine is 2.2, patient's potassium is 5.3, will repeat BMP later on  today  Anemia. Mild with no evidence of active bleeding  Left peroneal deep venous thrombosis, presently on systemic anticoagulation  Severe insulin resistance  Presently receiving enteral nutrition. Was unable to place PEG tube secondary to patient's anatomy. Nutrition is following  Critical care time 40 minutes  Cierah Crader 05/06/2018  *Care during the described time interval was provided by me and/or other providers on the critical care team.  I have reviewed this patient's available data, including medical history, events of note, physical examination and test results as part of my evaluation. Patient ID: Alexander Alexander, male   DOB: 1971-01-10, 47 y.o.   MRN: [id number]

## 2018-05-06 NOTE — Progress Notes (Signed)
OT Cancellation Note  Patient Details Name: Alexander RiffleOtis Leon Alexander MRN: [id number] DOB: 1971/04/26   Cancelled Treatment:    Reason Eval/Treat Not Completed: Medical issues which prohibited therapy(Pt. presents with elevated K+ level. Will continue to monitor pt., and intervene when K+levels are at the appropriate level.)  Olegario MessierElaine Wane Mollett, MS, OTR/L 05/06/2018, 12:43 PM

## 2018-05-06 NOTE — Progress Notes (Signed)
[facility] - [place] [facility]   PATIENT NAME: Alexander Alexander    MR#:  [id number]  DATE OF BIRTH:  1971/06/23  SUBJECTIVE:  patient resting comfortably on the ventilator  REVIEW OF SYSTEMS:  Review of Systems  Unable to perform ROS: Intubated    DRUG ALLERGIES:   Allergies  Allergen Reactions  . Lexapro [Escitalopram Oxalate] Swelling    VITALS:  Blood pressure 126/69, pulse 100, temperature 99 F (37.2 C), resp. rate 19, height 5\' 10"  (1.778 m), weight (!) 178 kg (392 lb 6.7 oz), SpO2 94 %.  PHYSICAL EXAMINATION:  GENERAL:  47 y.o.-year-old patient lying in the bed with no acute distress. obese EYES: Pupils equal, round, reactive to light and accommodation. No scleral icterus. Extraocular muscles intact.  HEENT: Head atraumatic, normocephalic. Oropharynx and nasopharynx clear. Trach++ NECK:  Supple, no jugular venous distention. No thyroid enlargement, no tenderness.  LUNGS: Normal breath sounds bilaterally, no wheezing, rales,rhonchi or crepitation. No use of accessory muscles of respiration.  CARDIOVASCULAR: S1, S2 normal. No murmurs, rubs, or gallops.  ABDOMEN: Soft, nontender, nondistended. Bowel sounds present. No organomegaly or mass.  EXTREMITIES: ++ pedal edema, cyanosis, or clubbing.  NEUROLOGIC: moves extremities, no focal weakness PSYCHIATRIC: The patient is alert  SKIN: No obvious rash, lesion, or ulcer.   Physical Exam LABORATORY PANEL:   CBC Recent Labs  Lab 05/06/18 0435  WBC 9.3  HGB 11.6*  HCT 35.7*  PLT 280   ------------------------------------------------------------------------------------------------------------------  Chemistries  Recent Labs  Lab 05/03/18 0424  05/06/18 0435  NA 138   < > 136  K 5.0   < > 5.3*  CL 103   < > 100  CO2 26   < > 29  GLUCOSE 197*   < > 171*  BUN 68*   < > 62*  CREATININE 2.63*   < > 2.20*  CALCIUM 8.6*   < > 9.0  MG  --   --  2.1  AST 27  --   --   ALT 20  --   --   ALKPHOS 66   --   --   BILITOT 0.9  --   --    < > = values in this interval not displayed.   ------------------------------------------------------------------------------------------------------------------  Cardiac Enzymes No results for input(s): TROPONINI in the last 168 hours. ------------------------------------------------------------------------------------------------------------------  RADIOLOGY:  Ir Gastrostomy Tube Mod Sed  Result Date: 05/04/2018 INDICATION: 47 year old with multiple medical problems including history of acute cardiopulmonary arrest. Patient has a tracheostomy tube and request for gastrostomy tube. GI was unable to safely place a gastrostomy tube with endoscopy. Patient had an abdominal CT that suggested there may be a safe percutaneous window for gastrostomy tube placement. Informed consent was obtained from the patient's wife. EXAM: FLUOROSCOPY TIME FOR GASTROSTOMY TUBE EVALUATION Physician: Rachelle HoraAdam R. Henn, MD MEDICATIONS: None ANESTHESIA/SEDATION: None FLUOROSCOPY TIME:  Fluoroscopy Time: 3 minutes and 48 seconds, 650 mGy COMPLICATIONS: None immediate. PROCEDURE: The procedure was explained to the patient. The risks and benefits of the procedure were discussed and the patient' s white questions were addressed. Informed consent was obtained from the patient's wife. Patient was placed supine on the interventional table. The stomach was insufflated with gas through the nasogastric tube. The colon is gas-filled and well visualized. However, there appeared to be redundant loops of colon in the splenic flexure that were projecting over the stomach. Due to patient's morbid obesity, it was difficult to get a true lateral view to assess the exact  position of the colon. Based on the fluoroscopic evaluation, a safe percutaneous window could not be identified. Therefore, gastrostomy tube was not attempted. IMPRESSION: Percutaneous gastrostomy tube was not attempted because a safe percutaneous  window was not identified. Consider surgical consultation. Electronically Signed   By: Richarda Overlie M.D.   On: 05/04/2018 15:40   Dg Chest Port 1 View  Result Date: 05/06/2018 CLINICAL DATA:  Acute respiratory failure. EXAM: PORTABLE CHEST 1 VIEW COMPARISON:  04/28/2018. FINDINGS: The tracheostomy tube remains in satisfactory position. Nasogastric tube in place, not clearly visible distally due to overlying density. Right PICC tip in the superior vena cava 4 cm above the superior cavoatrial junction. 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 No significant change in linear density in both lower lung zones and prominent pulmonary vasculature. Unremarkable bones. IMPRESSION: Stable cardiomegaly, pulmonary vascular congestion and bibasilar atelectasis. Electronically Signed   By: Beckie Salts M.D.   On: 05/06/2018 08:13   ASSESSMENT AND PLAN:  47 year old male with past medical history significant for A. fib status post ablation, aortic valve replacement on warfarin, history of congestive heart failure, CKD, morbid obesity, diabetes and sleep apnea who was getting an outpatient MRI of his lower back had a cardiac arrest.  *Acute cardiopulmonary arrest From respiratory causes Difficult weaning from vent,s/p tracheostomy Continuemechanical ventilation with weaning as tolerated, for Pegtube placement today with IR unsuccessful since safe window not found. Recommendsed surgical consultation.  *AcuteMRSA/VAP Treated withcourse of Linezolid Continue trach collar/vent protocol   *Atrial fibrillationwith RVR Continuemetoprolol,Cardizem, amiodarone, continue Heparin drip.  *History of mechanical aortic valve replacement Onheparin drip  *Acute toxic metabolic encephalopathy  Neurology input appreciated  Continue Kepprafor possible partial seizures  *Chronic diabetes mellitus type 2 Stable on current regiment  *acute renal failurew/ CKD stage III Secondary toATN  *Acute  septic shock, MRSA pneumonia Resolved Plan to complete 14 days linezolid  Disposition to LTAC when able  All the records are reviewed and case discussed with Care Management/Social Workerr. Management plans discussed with the patient, family and they are in agreement.  CODE STATUS: Full  TOTAL TIME TAKING CARE OF THIS PATIENT: 25 minutes.   POSSIBLE D/C IN few DAYS, DEPENDING ON CLINICAL CONDITION.   Orie Fisherman M.D on 05/06/2018   Between 7am to 6pm - Pager - [phone]  After 6pm go to [email or web address] - password [email or web address]  [facility]  Office  [phone]  CC: Primary care physician; Erasmo Downer, MD  Note: This dictation was prepared with Dragon dictation along with smaller phrase technology. Any transcriptional errors that result from this process are unintentional.

## 2018-05-06 NOTE — Progress Notes (Signed)
Pt taken off vent and placed on 40% trach collar, cuff deflated on trach,  tolerating well at this time, sats 95%, respiratory rate 20/min, RN notified, will continue to monitor.

## 2018-05-06 NOTE — Progress Notes (Signed)
PT remains on trach collar at 40%, sats 96%, respiratory rate 20/min, HR 112/min, will continue to monitor.

## 2018-05-06 NOTE — Progress Notes (Signed)
ANTICOAGULATION CONSULT NOTE   Pharmacy Consult for heparin drip management Indication: atrial fibrillation and aortic valve replacement   Allergies  Allergen Reactions  . Lexapro [Escitalopram Oxalate] Swelling    Patient Measurements: Height: 5\' 10"  (177.8 cm) Weight: (bed weight not working ) IBW/kg (Calculated) : 73 Heparin Dosing Weight: 121 kg (updated 6/21)  Vital Signs: Temp: 99 F (37.2 C) (06/29 0300) Temp Source: Oral (06/28 1942) BP: 120/78 (06/29 0300) Pulse Rate: 196 (06/29 0300)  Labs: Recent Labs    05/04/18 0525 05/04/18 1046  05/05/18 0343 05/05/18 0757 05/05/18 2146 05/06/18 0435  HGB  --   --   --  11.2*  --   --  11.6*  HCT  --   --   --  34.6*  --   --  35.7*  PLT  --   --   --  268  --   --  280  LABPROT 16.1*  --   --   --   --   --   --   INR 1.30  --   --   --   --   --   --   HEPARINUNFRC  --   --    < >  --  0.75* 0.49 0.55  CREATININE  --  2.25*  --   --   --   --  2.20*   < > = values in this interval not displayed.    Estimated Creatinine Clearance: 66.2 mL/min (A) (by C-G formula based on SCr of 2.2 mg/dL (H)).   Medical History: Past Medical History:  Diagnosis Date  . Allergy   . Anxiety   . Arrhythmia   . Arthritis   . Atrial fibrillation (HCC)   . CHF (congestive heart failure) (HCC)   . CKD (chronic kidney disease)   . Diabetes mellitus without complication (HCC)   . Diabetes mellitus, type II (HCC)   . Heart failure (HCC)   . Hyperlipidemia   . Hypertension   . Pancreatitis   . Sleep apnea     Medications:  Scheduled:  . budesonide (PULMICORT) nebulizer solution  0.25 mg Nebulization Q6H  . chlorhexidine  15 mL Mouth Rinse BID  . clonazePAM  1 mg Per Tube Daily  . clonazePAM  2 mg Oral QHS  . diltiazem  60 mg Oral Q6H  . famotidine  20 mg Per Tube Daily  . fentaNYL  150 mcg Transdermal Q72H  . free water  250 mL Per Tube Q6H  . insulin aspart  0-20 Units Subcutaneous Q4H  . insulin glargine  85 Units  Subcutaneous BID  . ipratropium-albuterol  3 mL Nebulization Q6H  . mouth rinse  15 mL Mouth Rinse 10 times per day  . metoprolol tartrate  50 mg Per Tube BID  . QUEtiapine  100 mg Per Tube QHS  . sodium chloride flush  10-40 mL Intracatheter Q12H   Infusions:  . dextrose Stopped (05/05/18 0045)  . feeding supplement (GLUCERNA 1.5 CAL) 1,000 mL (05/05/18 2215)  . heparin 2,300 Units/hr (05/05/18 2220)    Assessment: Pharmacy consulted for warfarin and heparin drip management for 47 yo male admitted to the ICU s/p cardiac arrest. Patient has history significant for aortic valve replacement, atrial fibrillation and CHF. Head CT is negative for infarction or hemorrhage.   Patient takes warfarin 5mg  daily as an outpatient for goal INR 2.5-3.5.    Goal of Therapy:  INR 2-3 Heparin level 0.3-0.7 units/ml Monitor platelets by  anticoagulation protocol: Yes   Plan:  Heparin level elevated will decrease heparin to 2300 units/hr. Will obtain heparin level at 2000.   Pharmacy will continue to monitor and adjust per consult.   6/28:  HL @ 22:00 = 0.49 Will continue this pt on current rate and recheck HL in 6 hrs on 6/29 @ 0400.   6/29: HL @ 0400 = 0.55 Will continue this pt on current rate and recheck HL on 6/30 with AM labs.   Karmina Zufall D 05/06/2018

## 2018-05-07 LAB — CBC
HCT: 35.3 % — ABNORMAL LOW (ref 40.0–52.0)
Hemoglobin: 11.5 g/dL — ABNORMAL LOW (ref 13.0–18.0)
MCH: 27.5 pg (ref 26.0–34.0)
MCHC: 32.7 g/dL (ref 32.0–36.0)
MCV: 83.9 fL (ref 80.0–100.0)
Platelets: 279 10*3/uL (ref 150–440)
RBC: 4.2 MIL/uL — AB (ref 4.40–5.90)
RDW: 18.9 % — ABNORMAL HIGH (ref 11.5–14.5)
WBC: 8.4 10*3/uL (ref 3.8–10.6)

## 2018-05-07 LAB — BASIC METABOLIC PANEL
Anion gap: 7 (ref 5–15)
BUN: 59 mg/dL — AB (ref 6–20)
CHLORIDE: 100 mmol/L (ref 98–111)
CO2: 32 mmol/L (ref 22–32)
CREATININE: 2.17 mg/dL — AB (ref 0.61–1.24)
Calcium: 9.2 mg/dL (ref 8.9–10.3)
GFR calc Af Amer: 39 mL/min — ABNORMAL LOW (ref >60)
GFR calc non Af Amer: 34 mL/min — ABNORMAL LOW (ref >60)
Glucose, Bld: 106 mg/dL — ABNORMAL HIGH (ref 70–99)
Potassium: 5.3 mmol/L — ABNORMAL HIGH (ref 3.5–5.1)
SODIUM: 139 mmol/L (ref 135–145)

## 2018-05-07 LAB — HEPARIN LEVEL (UNFRACTIONATED): Heparin Unfractionated: 0.61 [IU]/mL (ref 0.30–0.70)

## 2018-05-07 LAB — GLUCOSE, CAPILLARY
GLUCOSE-CAPILLARY: 157 mg/dL — AB (ref 70–99)
GLUCOSE-CAPILLARY: 142 mg/dL — AB (ref 70–99)
Glucose-Capillary: 108 mg/dL — ABNORMAL HIGH (ref 70–99)
Glucose-Capillary: 114 mg/dL — ABNORMAL HIGH (ref 70–99)
Glucose-Capillary: 180 mg/dL — ABNORMAL HIGH (ref 70–99)
Glucose-Capillary: 164 mg/dL — ABNORMAL HIGH (ref 70–99)

## 2018-05-07 MED ORDER — AMLODIPINE BESYLATE 10 MG PO TABS
10.0000 mg | ORAL_TABLET | Freq: Every day | ORAL | Status: DC
  Administered 2018-05-07 – 2018-05-12 (×6): 10 mg via ORAL
  Filled 2018-05-07 (×5): qty 1

## 2018-05-07 NOTE — Progress Notes (Signed)
Follow up - Critical Care Medicine Note  Patient Details:    Alexander Alexander is an 47 y.o. male.with atrial fibrillation, status post ablation and aortic valve replacement on long term warfarin, CHF, CKD, diabetes, obstructive sleep apnea and hypertension, suffered a pulseless VT cardiac arrest in MRI when he was being evaluated for back pain and sciatica, subsequently intubated in the intensive care unit. Difficult airway noted    Lines, Airways, Drains: PICC Triple Lumen 04/17/18 PICC Right Cephalic 39 cm 0 cm (Active)  Indication for Insertion or Continuance of Line Prolonged intravenous therapies 05/05/2018  7:51 AM  Exposed Catheter (cm) 0 cm 05/01/2018  5:37 PM  Site Assessment Clean;Dry;Intact 05/04/2018  7:27 PM  Lumen #1 Status Infusing;Flushed;Blood return noted 05/04/2018  7:27 PM  Lumen #2 Status Infusing;Flushed;Blood return noted 05/04/2018  7:27 PM  Lumen #3 Status Flushed;Saline locked;Blood return noted 05/04/2018  7:27 PM  Dressing Type Transparent 05/04/2018  7:27 PM  Dressing Status Antimicrobial disc in place;Intact;Dry;Clean 05/04/2018  7:27 PM  Line Care Connections checked and tightened 05/04/2018  7:27 PM  Dressing Intervention Other (Comment) 05/04/2018  7:27 PM  Dressing Change Due 05/11/18 05/04/2018  7:27 PM     NG/OG Tube Nasogastric Right nare Xray Documented cm marking at nare/ corner of mouth 65 cm (Active)  Cm Marking at Nare/Corner of Mouth (if applicable) 65 cm 05/04/2018  7:40 PM  Site Assessment Clean;Dry;Intact 05/05/2018  8:00 AM  Ongoing Placement Verification No acute changes, not attributed to clinical condition;No change in respiratory status;No change in cm markings or external length of tube from initial placement 05/05/2018  8:00 AM  Status Infusing tube feed 05/05/2018  8:00 AM  Intake (mL) 55 mL 05/05/2018  8:00 AM  Output (mL) 100 mL 05/03/2018 12:00 AM     Urethral Catheter C Green, Rn Double-lumen 14 Fr. (Active)  Indication for Insertion or  Continuance of Catheter Chronic catheter use 05/05/2018  8:00 AM  Site Assessment Clean;Intact 05/04/2018  7:27 PM  Catheter Maintenance Bag below level of bladder;Catheter secured;Drainage bag/tubing not touching floor;Insertion date on drainage bag;No dependent loops;Seal intact 05/05/2018  8:00 AM  Collection Container Standard drainage bag 05/04/2018  7:27 PM  Securement Method Other (Comment) 05/04/2018  7:27 PM  Urinary Catheter Interventions Unclamped 05/04/2018  7:27 PM  Output (mL) 325 mL 05/05/2018  8:00 AM    Anti-infectives:  Anti-infectives (From admission, onward)   Start     Dose/Rate Route Frequency Ordered Stop   04/28/18 1100  ceFAZolin (ANCEF) 3 g in dextrose 5 % 50 mL IVPB     3 g 100 mL/hr over 30 Minutes Intravenous  Once 04/28/18 0058 04/28/18 1058   04/19/18 1000  linezolid (ZYVOX) IVPB 600 mg  Status:  Discontinued     600 mg 300 mL/hr over 60 Minutes Intravenous Every 12 hours 04/19/18 0826 04/28/18 1011   04/18/18 1330  ceFEPIme (MAXIPIME) 2 g in sodium chloride 0.9 % 100 mL IVPB  Status:  Discontinued     2 g 200 mL/hr over 30 Minutes Intravenous Every 12 hours 04/18/18 1322 04/21/18 1108   04/18/18 0800  vancomycin (VANCOCIN) 1,250 mg in sodium chloride 0.9 % 250 mL IVPB  Status:  Discontinued     1,250 mg 166.7 mL/hr over 90 Minutes Intravenous Every 24 hours 04/18/18 0621 04/19/18 0826   04/17/18 0630  vancomycin (VANCOCIN) 1,250 mg in sodium chloride 0.9 % 250 mL IVPB     1,250 mg 166.7 mL/hr over 90 Minutes Intravenous  Once 04/17/18 0618 04/17/18 0855   04/14/18 1430  vancomycin (VANCOCIN) IVPB 1000 mg/200 mL premix  Status:  Discontinued     1,000 mg 200 mL/hr over 60 Minutes Intravenous Every 12 hours 04/14/18 1348 04/16/18 1450   04/11/18 2000  vancomycin (VANCOCIN) 1,250 mg in sodium chloride 0.9 % 250 mL IVPB  Status:  Discontinued     1,250 mg 166.7 mL/hr over 90 Minutes Intravenous Every 18 hours 04/11/18 1548 04/14/18 1348   04/10/18 1400   vancomycin (VANCOCIN) 1,250 mg in sodium chloride 0.9 % 250 mL IVPB  Status:  Discontinued     1,250 mg 166.7 mL/hr over 90 Minutes Intravenous Every 12 hours 04/10/18 1149 04/11/18 1548   04/10/18 1200  Ampicillin-Sulbactam (UNASYN) 3 g in sodium chloride 0.9 % 100 mL IVPB  Status:  Discontinued     3 g 200 mL/hr over 30 Minutes Intravenous Every 6 hours 04/10/18 0851 04/10/18 1131   04/09/18 1800  vancomycin (VANCOCIN) 1,500 mg in sodium chloride 0.9 % 500 mL IVPB  Status:  Discontinued     1,500 mg 250 mL/hr over 120 Minutes Intravenous Every 18 hours 04/09/18 1303 04/10/18 0750   04/09/18 1045  vancomycin (VANCOCIN) IVPB 1000 mg/200 mL premix     1,000 mg 200 mL/hr over 60 Minutes Intravenous  Once 04/09/18 1041 04/09/18 1307   04/07/18 1400  piperacillin-tazobactam (ZOSYN) IVPB 3.375 g  Status:  Discontinued     3.375 g 12.5 mL/hr over 240 Minutes Intravenous Every 8 hours 04/07/18 1015 04/10/18 0851      Microbiology: Results for orders placed or performed during the hospital encounter of 04/07/18  MRSA PCR Screening     Status: None   Collection Time: 04/07/18  9:49 AM  Result Value Ref Range Status   MRSA by PCR NEGATIVE NEGATIVE Final    Comment:        The GeneXpert MRSA Assay (FDA approved for NASAL specimens only), is one component of a comprehensive MRSA colonization surveillance program. It is not intended to diagnose MRSA infection nor to guide or monitor treatment for MRSA infections. Performed at [facility], [address] [place], [place] [address]   Culture, respiratory (NON-Expectorated)     Status: None   Collection Time: 04/08/18 10:53 AM  Result Value Ref Range Status   Specimen Description   Final    TRACHEAL ASPIRATE Performed at [facility], [address] [place], [place] [address]    Special Requests   Final    Normal Performed at [facility], [address] [place], [place] [address]    Gram Stain   Final     ABUNDANT WBC PRESENT, PREDOMINANTLY PMN NO SQUAMOUS EPITHELIAL CELLS SEEN MODERATE GRAM POSITIVE COCCI IN CLUSTERS FEW GRAM NEGATIVE COCCOBACILLI RARE GRAM POSITIVE RODS Performed at [facility], [address] [place], [place] [address]    Culture   Final    ABUNDANT METHICILLIN RESISTANT STAPHYLOCOCCUS AUREUS   Report Status 04/10/2018 FINAL  Final   Organism ID, Bacteria METHICILLIN RESISTANT STAPHYLOCOCCUS AUREUS  Final      Susceptibility   Methicillin resistant staphylococcus aureus - MIC*    CIPROFLOXACIN >=8 RESISTANT Resistant     ERYTHROMYCIN >=8 RESISTANT Resistant     GENTAMICIN <=0.5 SENSITIVE Sensitive     OXACILLIN >=4 RESISTANT Resistant     TETRACYCLINE <=1 SENSITIVE Sensitive     VANCOMYCIN 1 SENSITIVE Sensitive     TRIMETH/SULFA <=10 SENSITIVE Sensitive  CLINDAMYCIN <=0.25 SENSITIVE Sensitive     RIFAMPIN <=0.5 SENSITIVE Sensitive     Inducible Clindamycin NEGATIVE Sensitive     * ABUNDANT METHICILLIN RESISTANT STAPHYLOCOCCUS AUREUS  CULTURE, BLOOD (ROUTINE X 2) w Reflex to ID Panel     Status: None   Collection Time: 04/08/18 11:43 AM  Result Value Ref Range Status   Specimen Description BLOOD LAC  Final   Special Requests   Final    BOTTLES DRAWN AEROBIC AND ANAEROBIC Blood Culture adequate volume   Culture   Final    NO GROWTH 5 DAYS Performed at [facility], [address] [place], [place] [address]    Report Status 04/13/2018 FINAL  Final  CULTURE, BLOOD (ROUTINE X 2) w Reflex to ID Panel     Status: None   Collection Time: 04/08/18 11:43 AM  Result Value Ref Range Status   Specimen Description BLOOD LT HAND  Final   Special Requests   Final    BOTTLES DRAWN AEROBIC AND ANAEROBIC Blood Culture adequate volume   Culture   Final    NO GROWTH 5 DAYS Performed at [facility], [address] [place], [place] [address]    Report Status 04/13/2018 FINAL  Final  Urine Culture     Status: None   Collection Time:  04/08/18  1:16 PM  Result Value Ref Range Status   Specimen Description   Final    URINE, CATHETERIZED Performed at [facility], [address] [place], [place] [address]    Special Requests   Final    Normal Performed at [facility], [address] [place], [place] [address]    Culture   Final    NO GROWTH Performed at [facility], [address] [place], [place] [address]    Report Status 04/09/2018 FINAL  Final  Urine Culture     Status: None   Collection Time: 04/12/18  6:55 AM  Result Value Ref Range Status   Specimen Description   Final    URINE, RANDOM Performed at [facility], [address], [place], [place] [address]    Special Requests   Final    NONE Performed at [facility], [address], [place], [place] [address]    Culture   Final    NO GROWTH Performed at [facility], [address], [place], [place] [address]    Report Status 04/13/2018 FINAL  Final  CULTURE, BLOOD (ROUTINE X 2) w Reflex to ID Panel     Status: None   Collection Time: 04/12/18  7:30 AM  Result Value Ref Range Status   Specimen Description BLOOD BLOOD LEFT HAND  Final   Special Requests   Final    BOTTLES DRAWN AEROBIC AND ANAEROBIC Blood Culture adequate volume   Culture   Final    NO GROWTH 5 DAYS Performed at [facility], [address] [place], [place] [address]    Report Status 04/17/2018 FINAL  Final  CULTURE, BLOOD (ROUTINE X 2) w Reflex to ID Panel     Status: None   Collection Time: 04/12/18  7:46 AM  Result Value Ref Range Status   Specimen Description BLOOD BLOOD LEFT HAND  Final   Special Requests   Final    BOTTLES DRAWN AEROBIC AND ANAEROBIC Blood Culture adequate volume   Culture   Final    NO GROWTH 5 DAYS Performed at [facility], [address]  [place], [place] [address]    Report Status 04/17/2018 FINAL  Final  Culture, respiratory (NON-Expectorated)     Status: None    Collection Time: 04/12/18  8:11 AM  Result Value Ref Range Status   Specimen Description   Final    TRACHEAL ASPIRATE Performed at [facility] [address] [place], [place] [address]    Special Requests   Final    NONE Performed at [facility], [address] [place], [place] [address]    Gram Stain   Final    FEW WBC PRESENT, PREDOMINANTLY PMN FEW GRAM POSITIVE COCCI Performed at [facility], [address] [place], [place] [address]    Culture FEW METHICILLIN RESISTANT STAPHYLOCOCCUS AUREUS  Final   Report Status 04/15/2018 FINAL  Final   Organism ID, Bacteria METHICILLIN RESISTANT STAPHYLOCOCCUS AUREUS  Final      Susceptibility   Methicillin resistant staphylococcus aureus - MIC*    CIPROFLOXACIN >=8 RESISTANT Resistant     ERYTHROMYCIN >=8 RESISTANT Resistant     GENTAMICIN <=0.5 SENSITIVE Sensitive     OXACILLIN >=4 RESISTANT Resistant     TETRACYCLINE <=1 SENSITIVE Sensitive     VANCOMYCIN 1 SENSITIVE Sensitive     TRIMETH/SULFA <=10 SENSITIVE Sensitive     CLINDAMYCIN <=0.25 SENSITIVE Sensitive     RIFAMPIN <=0.5 SENSITIVE Sensitive     Inducible Clindamycin NEGATIVE Sensitive     * FEW METHICILLIN RESISTANT STAPHYLOCOCCUS AUREUS  C difficile quick scan w PCR reflex     Status: None   Collection Time: 04/12/18  3:05 PM  Result Value Ref Range Status   C Diff antigen NEGATIVE NEGATIVE Final   C Diff toxin NEGATIVE NEGATIVE Final   C Diff interpretation No C. difficile detected.  Final    Comment: Performed at [facility], [address] [place], [place] [address]  Culture, respiratory (NON-Expectorated)     Status: None   Collection Time: 04/18/18  9:37 AM  Result Value Ref Range Status   Specimen Description   Final    TRACHEAL ASPIRATE Performed at [facility], [address] [place], [place] [address]    Special Requests   Final    NONE Performed at [facility], [address]  [place], [place] [address]    Gram Stain   Final    MODERATE WBC PRESENT, PREDOMINANTLY PMN FEW GRAM POSITIVE COCCI IN PAIRS IN CLUSTERS Performed at [facility], [address] [place], [place] [address]    Culture   Final    MODERATE METHICILLIN RESISTANT STAPHYLOCOCCUS AUREUS   Report Status 04/21/2018 FINAL  Final   Organism ID, Bacteria METHICILLIN RESISTANT STAPHYLOCOCCUS AUREUS  Final      Susceptibility   Methicillin resistant staphylococcus aureus - MIC*    CIPROFLOXACIN >=8 RESISTANT Resistant     ERYTHROMYCIN >=8 RESISTANT Resistant     GENTAMICIN <=0.5 SENSITIVE Sensitive     OXACILLIN >=4 RESISTANT Resistant     TETRACYCLINE <=1 SENSITIVE Sensitive     VANCOMYCIN 1 SENSITIVE Sensitive     TRIMETH/SULFA <=10 SENSITIVE Sensitive     CLINDAMYCIN >=8 RESISTANT Resistant     RIFAMPIN <=0.5 SENSITIVE Sensitive     Inducible Clindamycin NEGATIVE Sensitive     * MODERATE METHICILLIN RESISTANT STAPHYLOCOCCUS AUREUS  C difficile quick scan w PCR reflex     Status: None   Collection Time: 04/19/18 11:21 AM  Result Value Ref Range Status   C Diff  antigen NEGATIVE NEGATIVE Final   C Diff toxin NEGATIVE NEGATIVE Final   C Diff interpretation No C. difficile detected.  Final    Comment: Performed at [facility], [address] [place], [place] [address]  C difficile quick scan w PCR reflex     Status: None   Collection Time: 04/24/18  5:30 AM  Result Value Ref Range Status   C Diff antigen NEGATIVE NEGATIVE Final   C Diff toxin NEGATIVE NEGATIVE Final   C Diff interpretation No C. difficile detected.  Final    Comment: Performed at [facility], [address] [place], [place] [address]  CULTURE, BLOOD (ROUTINE X 2) w Reflex to ID Panel     Status: None   Collection Time: 04/27/18 10:49 AM  Result Value Ref Range Status   Specimen Description BLOOD BLOOD LEFT HAND  Final   Special Requests   Final    BOTTLES DRAWN AEROBIC AND ANAEROBIC Blood Culture  adequate volume   Culture   Final    NO GROWTH 5 DAYS Performed at [facility], [address] [place], [place] [address]    Report Status 05/02/2018 FINAL  Final  CULTURE, BLOOD (ROUTINE X 2) w Reflex to ID Panel     Status: None   Collection Time: 04/27/18 11:58 AM  Result Value Ref Range Status   Specimen Description BLOOD BLOOD LEFT HAND  Final   Special Requests   Final    BOTTLES DRAWN AEROBIC AND ANAEROBIC Blood Culture adequate volume   Culture   Final    NO GROWTH 5 DAYS Performed at [facility], [address] [place], [place] [address]    Report Status 05/02/2018 FINAL  Final  Culture, respiratory (NON-Expectorated)     Status: None   Collection Time: 04/27/18 12:00 PM  Result Value Ref Range Status   Specimen Description   Final    TRACHEAL ASPIRATE Performed at [facility], [address] [place], [place] [address]    Special Requests   Final    NONE Performed at [facility], [address] [place], [place] [address]    Gram Stain   Final    ABUNDANT WBC PRESENT, PREDOMINANTLY PMN NO ORGANISMS SEEN Performed at [facility], [address] [place], [place] [address]    Culture FEW METHICILLIN RESISTANT STAPHYLOCOCCUS AUREUS  Final   Report Status 04/30/2018 FINAL  Final   Organism ID, Bacteria METHICILLIN RESISTANT STAPHYLOCOCCUS AUREUS  Final      Susceptibility   Methicillin resistant staphylococcus aureus - MIC*    CIPROFLOXACIN >=8 RESISTANT Resistant     ERYTHROMYCIN >=8 RESISTANT Resistant     GENTAMICIN <=0.5 SENSITIVE Sensitive     OXACILLIN >=4 RESISTANT Resistant     TETRACYCLINE <=1 SENSITIVE Sensitive     VANCOMYCIN 1 SENSITIVE Sensitive     TRIMETH/SULFA <=10 SENSITIVE Sensitive     CLINDAMYCIN <=0.25 SENSITIVE Sensitive     RIFAMPIN <=0.5 SENSITIVE Sensitive     Inducible Clindamycin NEGATIVE Sensitive     * FEW METHICILLIN RESISTANT STAPHYLOCOCCUS AUREUS  Urine Culture     Status: None    Collection Time: 04/27/18  5:27 PM  Result Value Ref Range Status   Specimen Description   Final    URINE, CATHETERIZED Performed at [facility], [address] [place], [place] [address]    Special Requests   Final    NONE Performed at [facility], [address], [place], [place] [address]    Culture   Final    NO GROWTH Performed at [facility], [address], [place], [place] [address]    Report Status 04/29/2018 FINAL  Final    Events: patient had some difficulty last night with possible mucus plugging. Presently resting comfortably back on mechanical ventilation   Studies: Ct Abdomen Wo Contrast  Result Date: 05/01/2018 CLINICAL DATA:  47 y/o  M; evaluate for PEG tube placement. EXAM: CT ABDOMEN WITHOUT CONTRAST TECHNIQUE: Multidetector CT imaging of the abdomen was performed following the standard protocol without IV contrast. COMPARISON:  None. FINDINGS: Lower chest: Cardiomegaly, platelike atelectasis in the lung bases, mitral annular calcification. Hepatobiliary: No focal liver abnormality is seen. No gallstones, gallbladder wall thickening, or biliary dilatation. Pancreas: Unremarkable. No pancreatic ductal dilatation or surrounding inflammatory changes. Spleen: Normal in size without focal abnormality. Adrenals/Urinary Tract: Adrenal glands are unremarkable. Kidneys are normal, without renal calculi, focal lesion, or hydronephrosis. Stomach/Bowel: Stomach is within normal limits. Appendix appears normal. No evidence of bowel wall thickening, distention, or inflammatory changes. The left lobe of the liver extends inferiorly overlapping the majority of the gastric body. There is no colon super position over the stomach at this time. There is a very small area of uncovered stomach in the left anterior axillary line below the ribcage (series 2, image 32). Vascular/Lymphatic: No significant vascular findings are present. No enlarged abdominal or pelvic  lymph nodes. Other: No abdominal wall hernia or abnormality. Musculoskeletal: No acute or significant osseous findings. IMPRESSION: The left lobe of the liver extends inferiorly overlapping the majority of the gastric body. There is no colon superficial to the stomach at this time. There is a very small area of uncovered stomach in the left anterior axillary line below the ribcage. Electronically Signed   By: Mitzi Hansen M.D.   On: 05/01/2018 23:14   Dg Abd 1 View  Result Date: 05/04/2018 CLINICAL DATA:  Preop for gastrostomy tube placement EXAM: ABDOMEN - 1 VIEW COMPARISON:  Abdominal CT from 3 days ago FINDINGS: Nasogastric tube tip over the stomach. Normal bowel gas pattern. Low volume chest with streaky opacities. IMPRESSION: Nasogastric tube with tip over the stomach. Limited abdominal coverage with no dilated bowel seen. Electronically Signed   By: Marnee Spring M.D.   On: 05/04/2018 12:27   Dg Abd 1 View  Result Date: 04/29/2018 CLINICAL DATA:  47 y/o  M; enteric tube placement. EXAM: ABDOMEN - 1 VIEW COMPARISON:  04/25/2018 abdomen radiograph. FINDINGS: Enteric tube tip projects over the gastric body. Post median sternotomy with broken lower wire. Left upper abdomen bowel gases travel. IMPRESSION: Enteric tube tip projects over gastric body. Electronically Signed   By: Mitzi Hansen M.D.   On: 04/29/2018 16:28   Dg Abd 1 View  Result Date: 04/25/2018 CLINICAL DATA:  Check gastric catheter placement EXAM: ABDOMEN - 1 VIEW COMPARISON:  04/18/2018 FINDINGS: Gastric catheter is noted extending into the stomach. Nonobstructive bowel gas pattern is seen. No other focal abnormality is noted. IMPRESSION: Nasogastric catheter within the stomach. Electronically Signed   By: Alcide Clever M.D.   On: 04/25/2018 09:13   Dg Abd 1 View  Result Date: 04/18/2018 CLINICAL DATA:  Orogastric tube placement EXAM: ABDOMEN - 1 VIEW COMPARISON:  None. FINDINGS: Orogastric tube tip is  slightly below the gastroesophageal junction in the proximal gastric cardia. The side port is felt to be above the diaphragm. There is a relative paucity of gas in  the visualized abdominal region. No obstruction or free air evident. IMPRESSION: Orogastric tube tip just below the gastroesophageal junction. Advise advancing orogastric tube approximately 8-10 cm to ensure that side-port as well as tube tip are in the stomach. Relative paucity of gas present. This finding may be seen normally but also may be indicative of early ileus or enteritis. Electronically Signed   By: Bretta Bang III M.D.   On: 04/18/2018 11:40   Ct Head Wo Contrast  Result Date: 04/09/2018 CLINICAL DATA:  47 year old with acute mental status changes after cardiac arrest. Possible new onset seizures. EXAM: CT HEAD WITHOUT CONTRAST TECHNIQUE: Contiguous axial images were obtained from the base of the skull through the vertex without intravenous contrast. COMPARISON:  04/07/2018. FINDINGS: Brain: Ventricular system normal in size and appearance for age. Physiologic calcifications in the basal ganglia bilaterally as noted previously. No mass lesion. No midline shift. No acute hemorrhage or hematoma. No extra-axial fluid collections. No evidence of acute infarction. No evidence of cerebral edema at this time. Vascular: Moderate BILATERAL carotid siphon and vertebrobasilar atherosclerosis. No hyperdense vessel. Skull: No skull fracture or other focal osseous abnormality involving the skull. Sinuses/Orbits: Visualized paranasal sinuses, bilateral mastoid air cells and bilateral middle ear cavities well-aerated. Visualized orbits and globes normal in appearance. Other: None. IMPRESSION: No acute intracranial abnormality. Electronically Signed   By: Hulan Saas M.D.   On: 04/09/2018 11:20   Ct Head Wo Contrast  Result Date: 04/07/2018 CLINICAL DATA:  Cardiac arrest EXAM: CT HEAD WITHOUT CONTRAST TECHNIQUE: Contiguous axial images were  obtained from the base of the skull through the vertex without intravenous contrast. COMPARISON:  None. FINDINGS: Brain: No evidence of acute infarction, hemorrhage, hydrocephalus, extra-axial collection or mass lesion/mass effect. Vascular: No hyperdense vessel or unexpected calcification. Skull: Negative Sinuses/Orbits: Negative Other: None IMPRESSION: Negative CT head Electronically Signed   By: Marlan Palau M.D.   On: 04/07/2018 15:25   Ct Chest Wo Contrast  Result Date: 04/18/2018 CLINICAL DATA:  Inpatient. Acute respiratory illness. Desaturation. Intubated. EXAM: CT CHEST WITHOUT CONTRAST TECHNIQUE: Multidetector CT imaging of the chest was performed following the standard protocol without IV contrast. COMPARISON:  Chest radiograph from earlier today. FINDINGS: Examination is significantly limited by patient body habitus and by streak artifact from the patient's upper extremities. Cardiovascular: Mild cardiomegaly. No significant pericardial effusion/thickening. Aortic valve prosthesis is in place. Coronary atherosclerosis. Atherosclerotic thoracic aorta with ectatic 4.3 cm ascending thoracic aorta. Dilated main pulmonary artery (3.9 cm diameter). Mediastinum/Nodes: No discrete thyroid nodules. Enteric tube terminates in the proximal stomach. No pathologically enlarged axillary, mediastinal or hilar lymph nodes, noting limited sensitivity for the detection of hilar adenopathy on this noncontrast study. Lungs/Pleura: No pneumothorax. No convincing pleural effusions. Endotracheal tube tip is 8.7 cm above the carina. Complete right lower lobe consolidation with scattered air bronchograms and some volume loss. Segmental right middle lobe and medial left lower lobe atelectasis. Patchy ground-glass opacities in the upper lobes bilaterally. Patchy nodular foci of consolidation in the right upper lobe measuring up to 9 mm (series 3/image 39). Upper abdomen: Diffuse hepatic steatosis. Musculoskeletal: No  aggressive appearing focal osseous lesions. Moderate symmetric gynecomastia. Discontinuity in the lower most sternotomy wire. Marked thoracic spondylosis. IMPRESSION: 1. Limited scan. Complete right lower lobe consolidation with scattered air bronchograms and some volume loss. Patchy ground-glass opacities in the bilateral upper lobes. Patchy nodular foci of consolidation in the right upper lobe. Multilobar pneumonia is suspected, with superimposed atelectasis in the right greater than left lungs.  2.  Endotracheal tube tip is 8.7 cm above the carina. 3. Mild cardiomegaly. Dilated main pulmonary artery, suggesting pulmonary arterial hypertension. 4. Ectatic 4.3 cm ascending thoracic aorta. Recommend annual imaging followup by CTA or MRA. This recommendation follows 2010 ACCF/AHA/AATS/ACR/ASA/SCA/SCAI/SIR/STS/SVM Guidelines for the Diagnosis and Management of Patients with Thoracic Aortic Disease. Circulation. 2010; 121: Z610-R604. 5. Diffuse hepatic steatosis. 6. Moderate symmetric gynecomastia. Aortic Atherosclerosis (ICD10-I70.0). Electronically Signed   By: Delbert Phenix M.D.   On: 04/18/2018 10:56   Ir Gastrostomy Tube Mod Sed  Result Date: 05/04/2018 INDICATION: 47 year old with multiple medical problems including history of acute cardiopulmonary arrest. Patient has a tracheostomy tube and request for gastrostomy tube. GI was unable to safely place a gastrostomy tube with endoscopy. Patient had an abdominal CT that suggested there may be a safe percutaneous window for gastrostomy tube placement. Informed consent was obtained from the patient's wife. EXAM: FLUOROSCOPY TIME FOR GASTROSTOMY TUBE EVALUATION Physician: Rachelle Hora. Henn, MD MEDICATIONS: None ANESTHESIA/SEDATION: None FLUOROSCOPY TIME:  Fluoroscopy Time: 3 minutes and 48 seconds, 650 mGy COMPLICATIONS: None immediate. PROCEDURE: The procedure was explained to the patient. The risks and benefits of the procedure were discussed and the patient' s white  questions were addressed. Informed consent was obtained from the patient's wife. Patient was placed supine on the interventional table. The stomach was insufflated with gas through the nasogastric tube. The colon is gas-filled and well visualized. However, there appeared to be redundant loops of colon in the splenic flexure that were projecting over the stomach. Due to patient's morbid obesity, it was difficult to get a true lateral view to assess the exact position of the colon. Based on the fluoroscopic evaluation, a safe percutaneous window could not be identified. Therefore, gastrostomy tube was not attempted. IMPRESSION: Percutaneous gastrostomy tube was not attempted because a safe percutaneous window was not identified. Consider surgical consultation. Electronically Signed   By: Richarda Overlie M.D.   On: 05/04/2018 15:40   US Venous Img Lower Bilateral  Result Date: 04/28/2018 CLINICAL DATA:  Fever, lower extremity edema EXAM: BILATERAL LOWER EXTREMITY VENOUS DOPPLER ULTRASOUND TECHNIQUE: Gray-scale sonography with graded compression, as well as color Doppler and duplex ultrasound were performed to evaluate the lower extremity deep venous systems from the level of the common femoral vein and including the common femoral, femoral, profunda femoral, popliteal and calf veins including the posterior tibial, peroneal and gastrocnemius veins when visible. The superficial great saphenous vein was also interrogated. Spectral Doppler was utilized to evaluate flow at rest and with distal augmentation maneuvers in the common femoral, femoral and popliteal veins. COMPARISON:  None. FINDINGS: Limited because of body habitus. RIGHT LOWER EXTREMITY Common Femoral Vein: No evidence of thrombus. Normal compressibility, respiratory phasicity and response to augmentation. Saphenofemoral Junction: No evidence of thrombus. Normal compressibility and flow on color Doppler imaging. Profunda Femoral Vein: No evidence of thrombus.  Normal compressibility and flow on color Doppler imaging. Femoral Vein: No evidence of thrombus. Normal compressibility, respiratory phasicity and response to augmentation. Popliteal Vein: No evidence of thrombus. Normal compressibility, respiratory phasicity and response to augmentation. Calf Veins: Limited visualization.  No gross occlusive thrombus. Superficial Great Saphenous Vein: No evidence of thrombus. Normal compressibility. Venous Reflux:  None. Other Findings:  None. LEFT LOWER EXTREMITY Common Femoral Vein: No evidence of thrombus. Normal compressibility, respiratory phasicity and response to augmentation. Saphenofemoral Junction: No evidence of thrombus. Normal compressibility and flow on color Doppler imaging. Profunda Femoral Vein: No evidence of thrombus. Normal compressibility and flow on  color Doppler imaging. Femoral Vein: No evidence of thrombus. Normal compressibility, respiratory phasicity and response to augmentation. Popliteal Vein: No evidence of thrombus. Normal compressibility, respiratory phasicity and response to augmentation. Calf Veins: Limited exam. However, the left tibial veins appear patent. But the left peroneal veins are noncompressible with hypoechoic thrombus appearing occlusive. No propagation into the popliteal or femoral veins. Superficial Great Saphenous Vein: No evidence of thrombus. Normal compressibility. Venous Reflux:  None. Other Findings:  None. IMPRESSION: Limited exam because of body habitus. Positive exam for left calf peroneal DVT without propagation into the femoral or popliteal veins. Very low thrombus burden. No significant right lower extremity DVT. Electronically Signed   By: Judie Petit.  Shick M.D.   On: 04/28/2018 16:02   Dg Chest Port 1 View  Result Date: 05/06/2018 CLINICAL DATA:  Acute respiratory failure. EXAM: PORTABLE CHEST 1 VIEW COMPARISON:  04/28/2018. FINDINGS: The tracheostomy tube remains in satisfactory position. Nasogastric tube in place, not  clearly visible distally due to overlying density. Right PICC tip in the superior vena cava 4 cm above the superior cavoatrial junction. 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 No significant change in linear density in both lower lung zones and prominent pulmonary vasculature. Unremarkable bones. IMPRESSION: Stable cardiomegaly, pulmonary vascular congestion and bibasilar atelectasis. Electronically Signed   By: Beckie Salts M.D.   On: 05/06/2018 08:13   Dg Chest Port 1 View  Result Date: 04/28/2018 CLINICAL DATA:  Respiratory failure EXAM: PORTABLE CHEST 1 VIEW COMPARISON:  April 27, 2018 FINDINGS: Tracheostomy catheter tip is 5.1 cm above the carina. Nasogastric tube tip and side port are below the diaphragm. No pneumothorax. There is bibasilar atelectatic change. Lungs elsewhere clear. Heart is mildly enlarged with pulmonary vascularity normal. Patient is status post median sternotomy. No evident bone lesions. No appreciable adenopathy. IMPRESSION: Tube positions as described without pneumothorax. Bibasilar atelectasis. Stable cardiac prominence. Electronically Signed   By: Bretta Bang III M.D.   On: 04/28/2018 13:55   Dg Chest Port 1 View  Result Date: 04/27/2018 CLINICAL DATA:  Respiratory failure EXAM: PORTABLE CHEST 1 VIEW COMPARISON:  Two days ago FINDINGS: Tracheostomy tube in good position. An orogastric tube reaches the stomach. There is artifact from EKG leads. Right upper extremity PICC with tip at the upper cavoatrial junction. Very low lung volumes with diffuse hazy chest opacity. Asymmetric parenchymal opacity on the left. Cardiac enlargement. Prior median sternotomy. No evidence of pneumothorax. IMPRESSION: 1. Stable hardware positioning. 2. Low volume chest with bilateral atelectasis or pneumonia and possible layering effusions. No convincing change from 2 days ago. Electronically Signed   By: Marnee Spring M.D.   On: 04/27/2018 07:36   Dg Chest Port 1 View  Result  Date: 04/25/2018 CLINICAL DATA:  Check tracheostomy placement EXAM: PORTABLE CHEST 1 VIEW COMPARISON:  04/24/2018 FINDINGS: Tracheostomy tube is noted in satisfactory position. Nasogastric catheter is noted coursing towards the stomach. The overall inspiratory effort is poor but relatively stable from the previous exam. Patchy changes are again identified in both lungs. Right-sided PICC line is again noted and stable. IMPRESSION: Tubes and lines as described above. Patchy infiltrative changes bilaterally. Electronically Signed   By: Alcide Clever M.D.   On: 04/25/2018 09:13   Dg Chest Port 1 View  Result Date: 04/24/2018 CLINICAL DATA:  Acute respiratory failure EXAM: PORTABLE CHEST 1 VIEW COMPARISON:  Yesterday FINDINGS: Stable cardiomegaly.  Status post aortic valve replacement. Endotracheal tube tip is at the clavicular heads. An orogastric tube at least  reaches the diaphragm, difficult to visualize due to body habitus. Right upper extremity PICC with tip at the upper cavoatrial junction. Low volume chest with hazy and streaky opacities. IMPRESSION: 1. Unchanged positioning of visible hardware. 2. Cardiomegaly, vascular congestion, and low volumes with atelectasis. Electronically Signed   By: Marnee Spring M.D.   On: 04/24/2018 07:18   Dg Chest Port 1 View  Result Date: 04/23/2018 CLINICAL DATA:  Patient status post respiratory arrest 04/07/2018. EXAM: PORTABLE CHEST 1 VIEW COMPARISON:  Single-view of the chest 04/21/2018 and 04/19/2018. FINDINGS: Endotracheal tube remains in place in good position. There is cardiomegaly and pulmonary edema. No pneumothorax. Likely small to moderate bilateral pleural effusions. IMPRESSION: No change in cardiomegaly and pulmonary edema since the most recent exam. Electronically Signed   By: Drusilla Kanner M.D.   On: 04/23/2018 10:22   Dg Chest Port 1 View  Result Date: 04/21/2018 CLINICAL DATA:  Respiratory failure EXAM: PORTABLE CHEST 1 VIEW COMPARISON:  04/19/2018  FINDINGS: Cardiac shadow is enlarged but stable. Endotracheal tube and nasogastric catheter are noted and stable. The overall inspiratory effort is again poor. Mild right basilar infiltrative density is seen. Similar changes are noted in the left lung base as well. Mild central vascular congestion is noted. The right-sided pleural effusion is less well visualized likely related to patient positioning. IMPRESSION: Vascular congestion and bibasilar atelectatic changes. Electronically Signed   By: Alcide Clever M.D.   On: 04/21/2018 06:38   Dg Chest Port 1 View  Result Date: 04/19/2018 CLINICAL DATA:  Hypoxia EXAM: PORTABLE CHEST 1 VIEW COMPARISON:  April 18, 2018 FINDINGS: Endotracheal tube tip is 4.0 cm above the carina. Nasogastric tube tip and side port are below the diaphragm. No pneumothorax. There is a right pleural effusion. There is patchy atelectatic change in both lower lobes with consolidation in the right base region. A lesser degree of consolidation is present in the medial left base. Heart is enlarged with pulmonary vascularity currently within normal limits. Patient is status post median sternotomy. No adenopathy. No bone lesions. IMPRESSION: Tube and catheter positions as described without pneumothorax. Persistent right pleural effusion with bibasilar consolidation, more on the right than on the left. There is also bibasilar atelectatic change. No new opacity evident. Electronically Signed   By: Bretta Bang III M.D.   On: 04/19/2018 07:32   Dg Chest Port 1 View  Result Date: 04/18/2018 CLINICAL DATA:  Hypoxia EXAM: PORTABLE CHEST 1 VIEW COMPARISON:  Chest CT April 18, 2018 FINDINGS: Endotracheal tube tip is 4.8 cm above the carina. Orogastric tube tip is below the diaphragm and not seen on this study. No pneumothorax. There is a small right pleural effusion with patchy consolidation in both lower lobes, more on the right than on the left. The lungs elsewhere are clear. There is cardiomegaly  with pulmonary venous hypertension. No adenopathy evident. Patient is status post median sternotomy. No bone lesions. IMPRESSION: Tube positions as described without evident pneumothorax. Right pleural effusion with bibasilar consolidation, more on the left than on the right, stable. Underlying pulmonary vascular congestion. Electronically Signed   By: Bretta Bang III M.D.   On: 04/18/2018 11:39   Dg Chest Port 1 View  Result Date: 04/18/2018 CLINICAL DATA:  Acute respiratory distress. EXAM: PORTABLE CHEST 1 VIEW COMPARISON:  16 2019 FINDINGS: Endotracheal tube tip is 6 cm above the carina. Nasogastric tube enters the abdomen. Right subclavian central line tip at the SVC RA junction. Support artifact overlies the chest. Left lung  is largely clear, possibly with mild basilar atelectasis. There is a right effusion more pronounced atelectasis in the right lower. IMPRESSION: Overlying artifact. Atelectasis right worse than left. Right effusion. Electronically Signed   By: Paulina Fusi M.D.   On: 04/18/2018 07:47   Dg Chest Port 1 View  Result Date: 04/17/2018 CLINICAL DATA:  PICC line placement EXAM: PORTABLE CHEST 1 VIEW COMPARISON:  Chest radiograph 04/17/2018 FINDINGS: Right-sided PICC line tip is in the mid SVC. There is a right IJ approach central venous catheter with its tip at the cavoatrial junction, unchanged. Endotracheal tube and gastric tube are unchanged. Lung the mediastinum are unchanged. IMPRESSION: Unchanged examination with right-sided PICC line tip in the mid SVC. Electronically Signed   By: Deatra Robinson M.D.   On: 04/17/2018 17:43   Dg Chest Port 1 View  Result Date: 04/17/2018 CLINICAL DATA:  47 year old male with a history of PICC placement EXAM: PORTABLE CHEST 1 VIEW COMPARISON:  04/17/2018, 04/14/2018 FINDINGS: Study is limited by underpenetration. Redemonstration of surgical changes of median sternotomy. Right IJ central venous catheter is unchanged, with the tip appearing to  terminate superior vena cava. Endotracheal tube terminates 4.2 cm above the carina. Gastric tube terminates out of the field of view. Lung volumes remain low with patchy airspace opacities bilaterally. No evidence of left or upper extremity PICC. IMPRESSION: Low lung volumes persist with unchanged appearance of patchy airspace opacities bilaterally. No PICC is visualized. Unchanged position of right IJ central venous catheter, endotracheal tube, gastric tube. Electronically Signed   By: Gilmer Mor D.O.   On: 04/17/2018 15:53   Dg Chest Port 1 View  Result Date: 04/17/2018 CLINICAL DATA:  Increased acute respiratory failure, history of atrial fibrillation and diabetes EXAM: PORTABLE CHEST 1 VIEW COMPARISON:  Portable chest x-ray of 04/14/2018 FINDINGS: The tip of the endotracheal tube is approximately 5.2 cm above the carina. Opacity remains at the right lung base most consistent with atelectasis, but there is more opacity now present at the left lung base. Considerations are that of atelectasis versus pneumonia. No definite effusion is seen. Cardiomegaly is stable. IMPRESSION: 1. Tip of endotracheal tube 5.2 cm above the carina. 2. Diminished aeration with increasing basilar opacities most consistent with atelectasis. Pneumonia cannot be excluded. 3. Stable cardiomegaly. Electronically Signed   By: Dwyane Dee M.D.   On: 04/17/2018 09:10   Dg Chest Port 1 View  Result Date: 04/14/2018 CLINICAL DATA:  Hypoxia EXAM: PORTABLE CHEST 1 VIEW COMPARISON:  April 12, 2018 FINDINGS: Endotracheal tube tip is 2.0 cm above the carina. Nasogastric tube tip and side port are below the diaphragm. Central catheter tip is at the cavoatrial junction. No evident pneumothorax. There is focal consolidation in the right base. Lungs elsewhere appear clear. Note that there is overlying artifact from the cooling mat. Heart is mildly enlarged with pulmonary vascularity normal. No bone lesions. No adenopathy evident. IMPRESSION: Tube  and catheter positions as described without pneumothorax. Consolidation right base, felt to represent focal pneumonia. No similar changes elsewhere. Stable cardiac prominence. Electronically Signed   By: Bretta Bang III M.D.   On: 04/14/2018 09:16   Dg Chest Port 1 View  Result Date: 04/12/2018 CLINICAL DATA:  Dyspnea. Endotracheal tube present. Status post cardiac arrest. EXAM: PORTABLE CHEST 1 VIEW COMPARISON:  04/10/2018 FINDINGS: Endotracheal tube, right jugular central venous catheter, and nasogastric tube remain in appropriate position. Heart size is stable. Low lung volumes are seen. Atelectasis again seen in both lung bases. No new  or worsening areas of pulmonary opacity are seen. No evidence of pulmonary consolidation or pneumothorax. IMPRESSION: No significant change in low lung volumes and bibasilar atelectasis. Electronically Signed   By: Myles Rosenthal M.D.   On: 04/12/2018 10:07   Dg Chest Port 1 View  Result Date: 04/10/2018 CLINICAL DATA:  Acute respiratory failure EXAM: PORTABLE CHEST 1 VIEW COMPARISON:  Earlier today at 1004 hours FINDINGS: Mildly degraded exam due to AP portable technique and patient body habitus. Endotracheal tube terminates 3.3 cm above carina.Prior median sternotomy. Nasogastric tube not well visualized distally but likely extends beyond the inferior aspect of the film. Right internal jugular line tip at low SVC. Cardiomegaly accentuated by AP portable technique. Small bilateral pleural effusions. No pneumothorax. Mild interstitial edema, superimposed upon low lung volumes. Bibasilar airspace disease. IMPRESSION: Decreased sensitivity and specificity exam due to technique related factors, as described above. Cardiomegaly with mild congestive heart failure, low lung volumes, and layering bilateral pleural effusions. Bibasilar airspace disease which is most likely atelectasis. Electronically Signed   By: Jeronimo Greaves M.D.   On: 04/10/2018 19:42   Dg Chest Port 1  View  Result Date: 04/10/2018 CLINICAL DATA:  Hypoxia EXAM: PORTABLE CHEST 1 VIEW COMPARISON:  April 09, 2018 FINDINGS: Endotracheal tube tip is 4.3 cm above the carina. Central catheter tip is at cavoatrial junction. Nasogastric tube tip and side port below the diaphragm. No pneumothorax. There is patchy airspace consolidation in both medial lung bases with small pleural effusions bilaterally. Lungs elsewhere clear. Heart is mildly enlarged with pulmonary vascularity normal. No adenopathy. Patient is status post median sternotomy. No bone lesions evident. IMPRESSION: Tube and catheter positions as described without pneumothorax. Suspect bibasilar pneumonia. Small pleural effusions. Stable cardiac prominence. Electronically Signed   By: Bretta Bang III M.D.   On: 04/10/2018 10:27   Dg Chest Port 1 View  Result Date: 04/09/2018 CLINICAL DATA:  Pneumonia EXAM: PORTABLE CHEST 1 VIEW COMPARISON:  April 08, 2018 FINDINGS: The ETT is in good position. The distal NG tube terminates below today's film. Stable cardiomegaly. Bibasilar opacities may represent small effusions with underlying atelectasis, stable on the right and mildly increased on the left. The right IJ is stable. No other interval changes. IMPRESSION: 1. Support apparatus as above. 2. Bibasilar opacities may represent small effusions and underlying atelectasis, stable on the right and mildly increased on the left in the interval. Electronically Signed   By: Gerome Sam III M.D   On: 04/09/2018 07:14   Dg Chest Port 1 View  Result Date: 04/08/2018 CLINICAL DATA:  Followup pneumonia. EXAM: PORTABLE CHEST 1 VIEW COMPARISON:  04/07/2018 and older exams. FINDINGS: Lung volumes are low. This accentuates the appearance of vascular congestion and interstitial prominence. There is mild linear atelectasis in the right mid lung, stable from the prior exam. Bilateral lung base opacity is most likely atelectasis. Pneumonia is possible. No overt pulmonary  edema. Is Stable changes from prior cardiac surgery. Cardiac silhouette is mildly enlarged. Endotracheal tube, right internal jugular central venous line and nasal/orogastric tube are stable. IMPRESSION: 1. No significant change from the previous day's study. 2. Vascular congestion with interstitial prominence, but without overt pulmonary edema. 3. Basilar atelectasis.  Cannot exclude pneumonia. Electronically Signed   By: Amie Portland M.D.   On: 04/08/2018 07:23   Dg Chest Portable 1 View  Result Date: 04/07/2018 CLINICAL DATA:  Central catheter placement.  Hypoxia. EXAM: PORTABLE CHEST 1 VIEW COMPARISON:  Apr 07, 2018 study obtained earlier in  the day FINDINGS: Right jugular catheter tip is in the superior vena cava near the cavoatrial junction. Endotracheal tube tip is 4.0 cm above the carina. Nasogastric tube tip and side port are below the diaphragm. No pneumothorax. There is bibasilar atelectasis. Lungs elsewhere are clear. 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 No adenopathy. No bone lesions. Patient is status post median sternotomy with aortic valve replacement. IMPRESSION: Tube and catheter positions as described without pneumothorax. There is pulmonary vascular congestion. Bibasilar atelectasis present. No consolidation evident. Electronically Signed   By: Bretta Bang III M.D.   On: 04/07/2018 08:51   Dg Abd Portable 1v  Result Date: 04/16/2018 CLINICAL DATA:  Orogastric tube placement EXAM: PORTABLE ABDOMEN - 1 VIEW COMPARISON:  April 10, 2018 FINDINGS: Orogastric tube tip is at the gastroesophageal junction. There is a paucity of gas. No free air evident. IMPRESSION: Orogastric tube tip at gastroesophageal junction. Advise advancing tube approximately 10 cm to insure tip and side port in stomach. Paucity of gas is noted. While this finding may be seen normally, it may also be indicative ileus or enteritis. Bowel obstruction not felt to be likely. Visualized lung bases  clear. Electronically Signed   By: Bretta Bang III M.D.   On: 04/16/2018 10:54   Dg Abd Portable 1v  Result Date: 04/10/2018 CLINICAL DATA:  Status post OG tube placement. EXAM: PORTABLE ABDOMEN - 1 VIEW COMPARISON:  Single view of the abdomen 04/07/2018. FINDINGS: OG tube tip  and sideport are both in the stomach. IMPRESSION: OG tube in good position. Electronically Signed   By: Drusilla Kanner M.D.   On: 04/10/2018 14:32   Dg Abd Portable 1v  Result Date: 04/07/2018 CLINICAL DATA:  NG placement EXAM: PORTABLE ABDOMEN - 1 VIEW COMPARISON:  None. FINDINGS: Limited exam due to large patient size and difficulty with positioning. NG tip in the proximal stomach. NG side hole in the distal esophagus. No dilated bowel loops. IMPRESSION: NG tube in the proximal stomach with the side hole in the distal esophagus. Recommend advancing at least 5 cm. Electronically Signed   By: Marlan Palau M.D.   On: 04/07/2018 10:48   [place] Ekg Site Rite  Result Date: 04/17/2018 If Site Rite image not attached, placement could not be confirmed due to current cardiac rhythm.   Consults: Treatment Team:  Lamar Blinks, MD Uvaldo Rising, MD Anson Fret, MD Thana Farr, MD Vernie Murders, MD   Subjective:    Overnight Issues: patient tolerated trach collar yesterday, rested on mechanical ventilation last night  Objective:  Vital signs for last 24 hours: Temp:  [98.3 F (36.8 C)-98.6 F (37 C)] 98.4 F (36.9 C) (06/30 0400) Pulse Rate:  [91-231] 212 (06/30 0600) Resp:  [14-36] 14 (06/30 0600) BP: (107-178)/(69-154) 172/101 (06/30 0600) SpO2:  [81 %-100 %] 94 % (06/30 0600) FiO2 (%):  [40 %] 40 % (06/30 0400) Weight:  [369 lb 14.9 oz (167.8 kg)] 369 lb 14.9 oz (167.8 kg) (06/30 0442)  Hemodynamic parameters for last 24 hours:    Intake/Output from previous day: 06/29 0701 - 06/30 0700 In: 766.5 [I.V.:356.5; NG/GT:410] Out: 2700 [Emesis/NG output:2700]  Intake/Output this shift: No  intake/output data recorded.  Vent settings for last 24 hours: Vent Mode: PSV FiO2 (%):  [40 %] 40 % PEEP:  [5 cmH20] 5 cmH20 Pressure Support:  [5 cmH20] 5 cmH20  Physical Exam:   General: Awake alert and appropriate to all commands Neuro:  eye opening, followed simplecommands HEENT: NCAT,  sclerae white Cardiovascular: bradycardia/ tacycardia, no M Lungs: Few rhonchi, no wheezes , trach site ++ secretions Abdomen: NT, ND, NABS, soft, diarrhoea Ext: 1+ LE edema  Assessment/Plan:   Ventilator dependent respiratory failure. Status post tracheostomy. Patient's FiO2 and PEEP requirements are stable. History of right lower lobe pneumonia, MRSA, completed 10 days of antibiotic therapy. Trying trach collar trials as tolerated.physical therapy. Chest x-ray did not reveal any evidence of lobar collapse  History of V. Tach cardiac arrest. Chronic atrial fibrillation with rapid ventricular response, status post mechanical aortic valve replacement. Will continue heparin infusion, patient is on metoprolol and diltiazem for rate control  Renal failure. Nonoliguric. Will recheck BMP this morning  Anemia. Mild with no evidence of active bleeding  Left peroneal deep venous thrombosis, presently on systemic anticoagulation  Severe insulin resistance  Presently receiving enteral nutrition. Was unable to place PEG tube secondary to patient's anatomy. Nutrition is following  Critical care time 35 minutes  Alexander Alexander 05/07/2018  *Care during the described time interval was provided by me and/or other providers on the critical care team.  I have reviewed this patient's available data, including medical history, events of note, physical examination and test results as part of my evaluation. Patient ID: Alexander Alexander, male   DOB: [birth year], 47 y.o.   MRN: [id number] Patient ID: Alexander Alexander, male   DOB: 12/22/1970, 47 y.o.   MRN: [id number]

## 2018-05-07 NOTE — Progress Notes (Signed)
Pt taken off vent and placed on trach collar, cuff deflated, pt tolerated without complications, respiratory rate 20/min, HR 108/min, sats 92% on 40% Trach Collar

## 2018-05-07 NOTE — Progress Notes (Signed)
Subjective:   Objective:  Pt is awake and following commands.  Current vital signs:    BP (!) 157/105   Pulse 100   Temp 98.3 F (36.8 C) (Oral)   Resp 15   Ht '5\' 10"'$  (1.778 m)   Wt (!) 369 lb 14.9 oz (167.8 kg)   SpO2 93%   BMI 53.08 kg/m  Vital signs in last 24 hours: Temp:  [98.3 F (36.8 C)-98.7 F (37.1 C)] 98.3 F (36.8 C) (06/30 1212) Pulse Rate:  [90-231] 100 (06/30 1530) Resp:  [14-33] 15 (06/30 0930) BP: (113-178)/(78-154) 157/105 (06/30 1530) SpO2:  [81 %-100 %] 93 % (06/30 1530) FiO2 (%):  [40 %] 40 % (06/30 0400) Weight:  [369 lb 14.9 oz (167.8 kg)] 369 lb 14.9 oz (167.8 kg) (06/30 0442)  Intake/Output from previous day: 06/29 0701 - 06/30 0700 In: 766.5 [I.V.:356.5; NG/GT:410] Out: 2700 [Emesis/NG output:2700] Intake/Output this shift: Total I/O In: 3901 [I.V.:276; NG/GT:3625] Out: 1000 [Urine:1000] Nutritional status:  Diet Order           Diet NPO time specified  Diet effective midnight          Neurologic Exam: Opens eyes to voice Looks around, able to track me Follows simple commands  Shows me two fingers on each side and wiggles toes.    Lab Results: Results for orders placed or performed during the hospital encounter of 04/07/18 (from the past 48 hour(s))  Glucose, capillary     Status: Abnormal   Collection Time: 05/05/18  7:41 PM  Result Value Ref Range   Glucose-Capillary 141 (H) 70 - 99 mg/dL  Heparin level (unfractionated)     Status: None   Collection Time: 05/05/18  9:46 PM  Result Value Ref Range   Heparin Unfractionated 0.49 0.30 - 0.70 IU/mL    Comment: (NOTE) If heparin results are below expected values, and patient dosage has  been confirmed, suggest follow up testing of antithrombin III levels. Performed at [facility], [place], [place] [address]   Glucose, capillary     Status: Abnormal   Collection Time: 05/06/18 12:05 AM  Result Value Ref Range   Glucose-Capillary 151 (H) 70 - 99 mg/dL    Comment 1 Notify RN   Glucose, capillary     Status: Abnormal   Collection Time: 05/06/18  4:11 AM  Result Value Ref Range   Glucose-Capillary 157 (H) 70 - 99 mg/dL   Comment 1 Notify RN   Basic metabolic panel     Status: Abnormal   Collection Time: 05/06/18  4:35 AM  Result Value Ref Range   Sodium 136 135 - 145 mmol/L   Potassium 5.3 (H) 3.5 - 5.1 mmol/L   Chloride 100 98 - 111 mmol/L    Comment: Please note change in reference range.   CO2 29 22 - 32 mmol/L   Glucose, Bld 171 (H) 70 - 99 mg/dL    Comment: Please note change in reference range.   BUN 62 (H) 6 - 20 mg/dL    Comment: Please note change in reference range.   Creatinine, Ser 2.20 (H) 0.61 - 1.24 mg/dL   Calcium 9.0 8.9 - 10.3 mg/dL   GFR calc non Af Amer 33 (L) >60 mL/min   GFR calc Af Amer 39 (L) >60 mL/min    Comment: (NOTE) The eGFR has been calculated using the CKD EPI equation. This calculation has not been validated in all clinical situations. eGFR's persistently <60 mL/min signify  possible Chronic Kidney Disease.    Anion gap 7 5 - 15    Comment: Performed at [facility], [address] [place], [place] [address]  CBC     Status: Abnormal   Collection Time: 05/06/18  4:35 AM  Result Value Ref Range   WBC 9.3 3.8 - 10.6 K/uL   RBC 4.22 (L) 4.40 - 5.90 MIL/uL   Hemoglobin 11.6 (L) 13.0 - 18.0 g/dL   HCT 35.7 (L) 40.0 - 52.0 %   MCV 84.5 80.0 - 100.0 fL   MCH 27.6 26.0 - 34.0 pg   MCHC 32.7 32.0 - 36.0 g/dL   RDW 19.1 (H) 11.5 - 14.5 %   Platelets 280 150 - 440 K/uL    Comment: Performed at [facility], [address] [place], [place] [address]  Heparin level (unfractionated)     Status: None   Collection Time: 05/06/18  4:35 AM  Result Value Ref Range   Heparin Unfractionated 0.55 0.30 - 0.70 IU/mL    Comment: (NOTE) If heparin results are below expected values, and patient dosage has  been confirmed, suggest follow up testing of antithrombin III levels. Performed at  [facility], [address] [place], [place] [address]   Magnesium     Status: None   Collection Time: 05/06/18  4:35 AM  Result Value Ref Range   Magnesium 2.1 1.7 - 2.4 mg/dL    Comment: Performed at [facility], [address] [place], [place] [address]  Phosphorus     Status: None   Collection Time: 05/06/18  4:35 AM  Result Value Ref Range   Phosphorus 3.7 2.5 - 4.6 mg/dL    Comment: Performed at [facility], [address] [place], [place] [address]  Glucose, capillary     Status: Abnormal   Collection Time: 05/06/18  7:30 AM  Result Value Ref Range   Glucose-Capillary 169 (H) 70 - 99 mg/dL  Glucose, capillary     Status: Abnormal   Collection Time: 05/06/18  3:36 PM  Result Value Ref Range   Glucose-Capillary 164 (H) 70 - 99 mg/dL  Basic metabolic panel     Status: Abnormal   Collection Time: 05/06/18  6:36 PM  Result Value Ref Range   Sodium 139 135 - 145 mmol/L   Potassium 5.5 (H) 3.5 - 5.1 mmol/L   Chloride 101 98 - 111 mmol/L    Comment: Please note change in reference range.   CO2 29 22 - 32 mmol/L   Glucose, Bld 152 (H) 70 - 99 mg/dL    Comment: Please note change in reference range.   BUN 60 (H) 6 - 20 mg/dL    Comment: Please note change in reference range.   Creatinine, Ser 2.14 (H) 0.61 - 1.24 mg/dL   Calcium 9.5 8.9 - 10.3 mg/dL   GFR calc non Af Amer 35 (L) >60 mL/min   GFR calc Af Amer 40 (L) >60 mL/min    Comment: (NOTE) The eGFR has been calculated using the CKD EPI equation. This calculation has not been validated in all clinical situations. eGFR's persistently <60 mL/min signify possible Chronic Kidney Disease.    Anion gap 9 5 - 15    Comment: Performed at [facility], [address] [place], [place] [address]  Glucose, capillary     Status: Abnormal   Collection Time: 05/06/18  7:29 PM  Result Value Ref Range   Glucose-Capillary 131 (H) 70 - 99  mg/dL   Comment 1 Notify RN   Glucose, capillary      Status: None   Collection Time: 05/06/18 11:10 PM  Result Value Ref Range   Glucose-Capillary 94 70 - 99 mg/dL   Comment 1 Notify RN   Glucose, capillary     Status: Abnormal   Collection Time: 05/07/18  3:18 AM  Result Value Ref Range   Glucose-Capillary 108 (H) 70 - 99 mg/dL  Heparin level (unfractionated)     Status: None   Collection Time: 05/07/18  4:43 AM  Result Value Ref Range   Heparin Unfractionated 0.61 0.30 - 0.70 IU/mL    Comment: (NOTE) If heparin results are below expected values, and patient dosage has  been confirmed, suggest follow up testing of antithrombin III levels. Performed at [facility], [address] [place], [place] [address]   CBC     Status: Abnormal   Collection Time: 05/07/18  4:43 AM  Result Value Ref Range   WBC 8.4 3.8 - 10.6 K/uL   RBC 4.20 (L) 4.40 - 5.90 MIL/uL   Hemoglobin 11.5 (L) 13.0 - 18.0 g/dL   HCT 35.3 (L) 40.0 - 52.0 %   MCV 83.9 80.0 - 100.0 fL   MCH 27.5 26.0 - 34.0 pg   MCHC 32.7 32.0 - 36.0 g/dL   RDW 18.9 (H) 11.5 - 14.5 %   Platelets 279 150 - 440 K/uL    Comment: Performed at [facility], [address] [place], [place] [address]  Basic metabolic panel     Status: Abnormal   Collection Time: 05/07/18  4:43 AM  Result Value Ref Range   Sodium 139 135 - 145 mmol/L   Potassium 5.3 (H) 3.5 - 5.1 mmol/L   Chloride 100 98 - 111 mmol/L    Comment: Please note change in reference range.   CO2 32 22 - 32 mmol/L   Glucose, Bld 106 (H) 70 - 99 mg/dL    Comment: Please note change in reference range.   BUN 59 (H) 6 - 20 mg/dL    Comment: Please note change in reference range.   Creatinine, Ser 2.17 (H) 0.61 - 1.24 mg/dL   Calcium 9.2 8.9 - 10.3 mg/dL   GFR calc non Af Amer 34 (L) >60 mL/min   GFR calc Af Amer 39 (L) >60 mL/min    Comment: (NOTE) The eGFR has been calculated using the CKD EPI equation. This calculation has not been validated in all clinical situations. eGFR's persistently <60 mL/min  signify possible Chronic Kidney Disease.    Anion gap 7 5 - 15    Comment: Performed at [facility], [place], [place] [address]  Glucose, capillary     Status: Abnormal   Collection Time: 05/07/18  7:05 AM  Result Value Ref Range   Glucose-Capillary 114 (H) 70 - 99 mg/dL  Glucose, capillary     Status: Abnormal   Collection Time: 05/07/18 11:42 AM  Result Value Ref Range   Glucose-Capillary 157 (H) 70 - 99 mg/dL  Glucose, capillary     Status: Abnormal   Collection Time: 05/07/18  3:58 PM  Result Value Ref Range   Glucose-Capillary 180 (H) 70 - 99 mg/dL    Recent Results (from the past 240 hour(s))  Urine Culture     Status: None   Collection Time: 04/27/18  5:27 PM  Result Value Ref Range Status   Specimen Description   Final  URINE, CATHETERIZED Performed at [facility], [address], [place], [place] [address]    Special Requests   Final    NONE Performed at [facility], [address], [place], [place] [address]    Culture   Final    NO GROWTH Performed at [facility], [place] [address] [place], [place] [address]    Report Status 04/29/2018 FINAL  Final    Lipid Panel No results for input(s): CHOL, TRIG, HDL, CHOLHDL, VLDL, LDLCALC in the last 72 hours.  Studies/Results: Dg Chest Port 1 View  Result Date: 05/06/2018 CLINICAL DATA:  Acute respiratory failure. EXAM: PORTABLE CHEST 1 VIEW COMPARISON:  04/28/2018. FINDINGS: The tracheostomy tube remains in satisfactory position. Nasogastric tube in place, not clearly visible distally due to overlying density. Right PICC tip in the superior vena cava 4 cm above the superior cavoatrial junction. 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 No significant change in linear density in both lower lung zones and prominent pulmonary vasculature. Unremarkable bones. IMPRESSION: Stable cardiomegaly, pulmonary vascular congestion and bibasilar atelectasis. Electronically  Signed   By: Claudie Revering M.D.   On: 05/06/2018 08:13    Medications: I have reviewed the patient's current medications.  Assessment/Plan:  47 year old male with past medical history significant for A. fib status post ablation, aortic valve replacement on warfarin, history of congestive heart failure, CKD, morbid obesity, diabetes and sleep apnea who was getting an outpatient MRI of his lower back had a cardiac arrest - S/p Trach on TCT currently  - Pt is off Keppra  - will follow up periodically - needs placement  - discussed with wife at bedside.

## 2018-05-07 NOTE — Progress Notes (Signed)
ANTICOAGULATION CONSULT NOTE   Pharmacy Consult for heparin drip management Indication: atrial fibrillation and aortic valve replacement   Allergies  Allergen Reactions  . Lexapro [Escitalopram Oxalate] Swelling    Patient Measurements: Height: 5\' 10"  (177.8 cm) Weight: (!) 369 lb 14.9 oz (167.8 kg) IBW/kg (Calculated) : 73 Heparin Dosing Weight: 121 kg (updated 6/21)  Vital Signs: Temp: 98.4 F (36.9 C) (06/30 0400) Temp Source: Oral (06/30 0400) BP: 136/102 (06/30 0400) Pulse Rate: 115 (06/30 0400)  Labs: Recent Labs    05/04/18 0525 05/04/18 1046  05/05/18 0343  05/05/18 2146 05/06/18 0435 05/06/18 1836 05/07/18 0443  HGB  --   --    < > 11.2*  --   --  11.6*  --  11.5*  HCT  --   --   --  34.6*  --   --  35.7*  --  35.3*  PLT  --   --   --  268  --   --  280  --  279  LABPROT 16.1*  --   --   --   --   --   --   --   --   INR 1.30  --   --   --   --   --   --   --   --   HEPARINUNFRC  --   --    < >  --    < > 0.49 0.55  --  0.61  CREATININE  --  2.25*  --   --   --   --  2.20* 2.14*  --    < > = values in this interval not displayed.    Estimated Creatinine Clearance: 65.5 mL/min (A) (by C-G formula based on SCr of 2.14 mg/dL (H)).   Medical History: Past Medical History:  Diagnosis Date  . Allergy   . Anxiety   . Arrhythmia   . Arthritis   . Atrial fibrillation (HCC)   . CHF (congestive heart failure) (HCC)   . CKD (chronic kidney disease)   . Diabetes mellitus without complication (HCC)   . Diabetes mellitus, type II (HCC)   . Heart failure (HCC)   . Hyperlipidemia   . Hypertension   . Pancreatitis   . Sleep apnea     Medications:  Scheduled:  . budesonide (PULMICORT) nebulizer solution  0.25 mg Nebulization Q6H  . chlorhexidine  15 mL Mouth Rinse BID  . clonazePAM  1 mg Per Tube Daily  . clonazePAM  2 mg Oral QHS  . diltiazem  60 mg Oral Q6H  . famotidine  20 mg Per Tube Daily  . fentaNYL  150 mcg Transdermal Q72H  . free water  250 mL  Per Tube Q6H  . insulin aspart  0-20 Units Subcutaneous Q4H  . insulin glargine  85 Units Subcutaneous BID  . ipratropium-albuterol  3 mL Nebulization Q6H  . mouth rinse  15 mL Mouth Rinse 10 times per day  . metoprolol tartrate  50 mg Per Tube BID  . QUEtiapine  100 mg Per Tube QHS  . sodium chloride flush  10-40 mL Intracatheter Q12H   Infusions:  . feeding supplement (GLUCERNA 1.5 CAL) 1,000 mL (05/07/18 0500)  . heparin 2,300 Units/hr (05/06/18 2026)    Assessment: Pharmacy consulted for warfarin and heparin drip management for 47 yo male admitted to the ICU s/p cardiac arrest. Patient has history significant for aortic valve replacement, atrial fibrillation and CHF. Head CT is  negative for infarction or hemorrhage.   Patient takes warfarin 5mg  daily as an outpatient for goal INR 2.5-3.5.    Goal of Therapy:  INR 2-3 Heparin level 0.3-0.7 units/ml Monitor platelets by anticoagulation protocol: Yes   Plan:  Heparin level elevated will decrease heparin to 2300 units/hr. Will obtain heparin level at 2000.   Pharmacy will continue to monitor and adjust per consult.   6/28:  HL @ 22:00 = 0.49 Will continue this pt on current rate and recheck HL in 6 hrs on 6/29 @ 0400.   6/29: HL @ 0400 = 0.55 Will continue this pt on current rate and recheck HL on 6/30 with AM labs.   6/30: HL @ 0443 = 0.61 Will continue this pt on current rate and recheck HL on 7/1 with AM labs.   [place] Rico D 05/07/2018

## 2018-05-08 DIAGNOSIS — G4733 Obstructive sleep apnea (adult) (pediatric): Secondary | ICD-10-CM

## 2018-05-08 LAB — COMPREHENSIVE METABOLIC PANEL
ALBUMIN: 3.2 g/dL — AB (ref 3.5–5.0)
ALK PHOS: 71 U/L (ref 38–126)
ALT: 22 U/L (ref 0–44)
ANION GAP: 6 (ref 5–15)
AST: 24 U/L (ref 15–41)
BILIRUBIN TOTAL: 0.9 mg/dL (ref 0.3–1.2)
BUN: 57 mg/dL — AB (ref 6–20)
CALCIUM: 9.2 mg/dL (ref 8.9–10.3)
CO2: 34 mmol/L — ABNORMAL HIGH (ref 22–32)
CREATININE: 2.28 mg/dL — AB (ref 0.61–1.24)
Chloride: 98 mmol/L (ref 98–111)
GFR calc Af Amer: 37 mL/min — ABNORMAL LOW (ref >60)
GFR calc non Af Amer: 32 mL/min — ABNORMAL LOW (ref >60)
Glucose, Bld: 166 mg/dL — ABNORMAL HIGH (ref 70–99)
Potassium: 6.2 mmol/L — ABNORMAL HIGH (ref 3.5–5.1)
Sodium: 138 mmol/L (ref 135–145)
TOTAL PROTEIN: 9.1 g/dL — AB (ref 6.5–8.1)

## 2018-05-08 LAB — CBC
HEMATOCRIT: 36 % — AB (ref 40.0–52.0)
HEMATOCRIT: 35.8 % — AB (ref 40.0–52.0)
HEMOGLOBIN: 11.8 g/dL — AB (ref 13.0–18.0)
Hemoglobin: 11.7 g/dL — ABNORMAL LOW (ref 13.0–18.0)
MCH: 27.4 pg (ref 26.0–34.0)
MCH: 27.8 pg (ref 26.0–34.0)
MCHC: 32.6 g/dL (ref 32.0–36.0)
MCHC: 32.8 g/dL (ref 32.0–36.0)
MCV: 84.1 fL (ref 80.0–100.0)
MCV: 84.6 fL (ref 80.0–100.0)
Platelets: 265 10*3/uL (ref 150–440)
Platelets: 267 10*3/uL (ref 150–440)
RBC: 4.28 MIL/uL — ABNORMAL LOW (ref 4.40–5.90)
RBC: 4.24 MIL/uL — ABNORMAL LOW (ref 4.40–5.90)
RDW: 19.2 % — ABNORMAL HIGH (ref 11.5–14.5)
RDW: 19.1 % — ABNORMAL HIGH (ref 11.5–14.5)
WBC: 7 10*3/uL (ref 3.8–10.6)
WBC: 6.5 10*3/uL (ref 3.8–10.6)

## 2018-05-08 LAB — GLUCOSE, CAPILLARY
GLUCOSE-CAPILLARY: 156 mg/dL — AB (ref 70–99)
GLUCOSE-CAPILLARY: 104 mg/dL — AB (ref 70–99)
Glucose-Capillary: 160 mg/dL — ABNORMAL HIGH (ref 70–99)
Glucose-Capillary: 179 mg/dL — ABNORMAL HIGH (ref 70–99)
Glucose-Capillary: 141 mg/dL — ABNORMAL HIGH (ref 70–99)

## 2018-05-08 LAB — HEPARIN LEVEL (UNFRACTIONATED)
Heparin Unfractionated: 0.94 IU/mL — ABNORMAL HIGH (ref 0.30–0.70)
Heparin Unfractionated: 0.71 IU/mL — ABNORMAL HIGH (ref 0.30–0.70)
Heparin Unfractionated: 0.6 IU/mL (ref 0.30–0.70)

## 2018-05-08 LAB — POTASSIUM
POTASSIUM: 5.6 mmol/L — AB (ref 3.5–5.1)
Potassium: 6.7 mmol/L (ref 3.5–5.1)

## 2018-05-08 MED ORDER — FUROSEMIDE 10 MG/ML IJ SOLN
60.0000 mg | Freq: Once | INTRAMUSCULAR | Status: AC
  Administered 2018-05-08: 60 mg via INTRAVENOUS
  Filled 2018-05-08: qty 6

## 2018-05-08 MED ORDER — NEPRO/CARBSTEADY PO LIQD
1000.0000 mL | ORAL | Status: DC
  Administered 2018-05-08 – 2018-05-14 (×8): 1000 mL via ORAL

## 2018-05-08 MED ORDER — DEXTROSE 50 % IV SOLN
1.0000 | Freq: Once | INTRAVENOUS | Status: AC
  Administered 2018-05-08: 50 mL via INTRAVENOUS
  Filled 2018-05-08: qty 50

## 2018-05-08 MED ORDER — SODIUM BICARBONATE 8.4 % IV SOLN: 50.0000 meq | Freq: Once | INTRAVENOUS | Status: DC

## 2018-05-08 MED ORDER — PRO-STAT SUGAR FREE PO LIQD
30.0000 mL | Freq: Every day | ORAL | Status: DC
  Administered 2018-05-08 – 2018-05-19 (×11): 30 mL

## 2018-05-08 MED ORDER — INSULIN ASPART 100 UNIT/ML IV SOLN
10.0000 [IU] | Freq: Once | INTRAVENOUS | Status: AC
  Administered 2018-05-08: 10 [IU] via INTRAVENOUS
  Filled 2018-05-08: qty 0.1

## 2018-05-08 MED ORDER — INSULIN ASPART 100 UNIT/ML IV SOLN
10.0000 [IU] | Freq: Once | INTRAVENOUS | Status: AC
  Administered 2018-05-08: 10 [IU] via INTRAVENOUS
  Filled 2018-05-08 (×2): qty 0.1

## 2018-05-08 MED ORDER — PATIROMER SORBITEX CALCIUM 8.4 G PO PACK
8.4000 g | PACK | Freq: Every day | ORAL | Status: DC
  Administered 2018-05-08: 8.4 g via ORAL
  Filled 2018-05-08 (×2): qty 1

## 2018-05-08 MED ORDER — STERILE WATER FOR INJECTION IV SOLN
INTRAVENOUS | Status: DC
  Administered 2018-05-08: 15:00:00 via INTRAVENOUS
  Filled 2018-05-08 (×2): qty 850

## 2018-05-08 NOTE — Progress Notes (Addendum)
[facility] - [place] [facility]   PATIENT NAME: Alexander Alexander    MR#:  [id number]  DATE OF BIRTH:  [birth year]  SUBJECTIVE:  patient resting comfortably. Follows commands  REVIEW OF SYSTEMS:  Review of Systems  Unable to perform ROS: Intubated    DRUG ALLERGIES:   Allergies  Allergen Reactions  . Lexapro [Escitalopram Oxalate] Swelling    VITALS:  Blood pressure (!) 144/95, pulse (!) 106, temperature 98.4 F (36.9 C), temperature source Oral, resp. rate 20, height 5\' 10"  (1.778 m), weight (!) 169.3 kg (373 lb 3.8 oz), SpO2 95 %.  PHYSICAL EXAMINATION:  GENERAL:  47 y.o.-year-old patient lying in the bed with no acute distress. obese EYES: Pupils equal, round, reactive to light and accommodation. No scleral icterus. Extraocular muscles intact.  HEENT: Head atraumatic, normocephalic. Oropharynx and nasopharynx clear. Trach++ NECK:  Supple, no jugular venous distention. No thyroid enlargement, no tenderness.  LUNGS: Normal breath sounds bilaterally, no wheezing, rales,rhonchi or crepitation. No use of accessory muscles of respiration.  CARDIOVASCULAR: S1, S2 normal. No murmurs, rubs, or gallops.  ABDOMEN: Soft, nontender, nondistended. Bowel sounds present. No organomegaly or mass.  EXTREMITIES: ++ pedal edema, cyanosis, or clubbing.  NEUROLOGIC: moves extremities, no focal weakness PSYCHIATRIC: The patient is alert  SKIN: No obvious rash, lesion, or ulcer.   Physical Exam LABORATORY PANEL:   CBC Recent Labs  Lab 05/08/18 1130  WBC 7.0  HGB 11.7*  HCT 36.0*  PLT 265   ------------------------------------------------------------------------------------------------------------------  Chemistries  Recent Labs  Lab 05/06/18 0435  05/08/18 0420 05/08/18 1130  NA 136   < > 138  --   K 5.3*   < > 6.2* 6.7*  CL 100   < > 98  --   CO2 29   < > 34*  --   GLUCOSE 171*   < > 166*  --   BUN 62*   < > 57*  --   CREATININE 2.20*   < > 2.28*  --    CALCIUM 9.0   < > 9.2  --   MG 2.1  --   --   --   AST  --   --  24  --   ALT  --   --  22  --   ALKPHOS  --   --  71  --   BILITOT  --   --  0.9  --    < > = values in this interval not displayed.   ------------------------------------------------------------------------------------------------------------------  Cardiac Enzymes No results for input(s): TROPONINI in the last 168 hours. ------------------------------------------------------------------------------------------------------------------  RADIOLOGY:  No results found. ASSESSMENT AND PLAN:  47 year old male with past medical history significant for A. fib status post ablation, aortic valve replacement on warfarin, history of congestive heart failure, CKD, morbid obesity, diabetes and sleep apnea who was getting an outpatient MRI of his lower back had a cardiac arrest.  * Acute cardiopulmonary arrest Pulseless VT Difficult weaning from vent,s/p tracheostomy Continuemechanical ventilation with weaning as tolerated  PEG by  IR unsuccessful since safe window not found.  * AcuteMRSA/VAP Treated withcourse of Linezolid Continue trach collar/vent protocol   *Atrial fibrillationwith RVR Continuemetoprolol,Cardizem, amiodarone, continue Heparin drip.  * History of mechanical aortic valve replacement Onheparin drip  * Acute toxic metabolic encephalopathy  Neurology input appreciated  Started on Kepprafor possible partial seizures. Later stopped.  * Chronic diabetes mellitus type 2 Stable on current regiment  * Acute renal failurew/ CKD stage III Secondary toATN  *  Acute septic shock, MRSA pneumonia Resolved complete 14 days linezolid  Disposition to LTAC when able  All the records are reviewed and case discussed with Care Management/Social Workerr. Management plans discussed with the patient, family and they are in agreement.  CODE STATUS: Full  TOTAL TIME TAKING CARE OF THIS PATIENT:  25 minutes.   POSSIBLE D/C IN few DAYS, DEPENDING ON CLINICAL CONDITION.   Orie Fisherman M.D on 05/08/2018   Between 7am to 6pm - Pager - [phone]  After 6pm go to [email or web address] - password [email or web address]  [facility]  Office  [phone]  CC: Primary care physician; Erasmo Downer, MD  Note: This dictation was prepared with Dragon dictation along with smaller phrase technology. Any transcriptional errors that result from this process are unintentional.

## 2018-05-08 NOTE — Progress Notes (Signed)
ANTICOAGULATION CONSULT NOTE   Pharmacy Consult for heparin drip management Indication: atrial fibrillation and aortic valve replacement   Allergies  Allergen Reactions  . Lexapro [Escitalopram Oxalate] Swelling    Patient Measurements: Height: 5\' 10"  (177.8 cm) Weight: (!) 369 lb 14.9 oz (167.8 kg) IBW/kg (Calculated) : 73 Heparin Dosing Weight: 121 kg (updated 6/21)  Vital Signs: Temp: 98.6 F (37 C) (07/01 0000) Temp Source: Oral (07/01 0000) BP: 121/98 (07/01 0330) Pulse Rate: 101 (07/01 0330)  Labs: Recent Labs    05/06/18 0435 05/06/18 1836 05/07/18 0443 05/08/18 0420  HGB 11.6*  --  11.5*  --   HCT 35.7*  --  35.3*  --   PLT 280  --  279  --   HEPARINUNFRC 0.55  --  0.61 0.94*  CREATININE 2.20* 2.14* 2.17*  --     Estimated Creatinine Clearance: 64.6 mL/min (A) (by C-G formula based on SCr of 2.17 mg/dL (H)).   Medical History: Past Medical History:  Diagnosis Date  . Allergy   . Anxiety   . Arrhythmia   . Arthritis   . Atrial fibrillation (HCC)   . CHF (congestive heart failure) (HCC)   . CKD (chronic kidney disease)   . Diabetes mellitus without complication (HCC)   . Diabetes mellitus, type II (HCC)   . Heart failure (HCC)   . Hyperlipidemia   . Hypertension   . Pancreatitis   . Sleep apnea     Medications:  Scheduled:  . amLODipine  10 mg Oral Daily  . budesonide (PULMICORT) nebulizer solution  0.25 mg Nebulization Q6H  . chlorhexidine  15 mL Mouth Rinse BID  . clonazePAM  1 mg Per Tube Daily  . clonazePAM  2 mg Oral QHS  . diltiazem  60 mg Oral Q6H  . famotidine  20 mg Per Tube Daily  . fentaNYL  150 mcg Transdermal Q72H  . free water  250 mL Per Tube Q6H  . insulin aspart  0-20 Units Subcutaneous Q4H  . insulin glargine  85 Units Subcutaneous BID  . ipratropium-albuterol  3 mL Nebulization Q6H  . mouth rinse  15 mL Mouth Rinse 10 times per day  . metoprolol tartrate  50 mg Per Tube BID  . QUEtiapine  100 mg Per Tube QHS  .  sodium chloride flush  10-40 mL Intracatheter Q12H   Infusions:  . feeding supplement (GLUCERNA 1.5 CAL) 75 mL/hr at 05/08/18 0200  . heparin 2,300 Units/hr (05/08/18 0200)    Assessment: Pharmacy consulted for warfarin and heparin drip management for 47 yo male admitted to the ICU s/p cardiac arrest. Patient has history significant for aortic valve replacement, atrial fibrillation and CHF. Head CT is negative for infarction or hemorrhage.   Patient takes warfarin 5mg  daily as an outpatient for goal INR 2.5-3.5.    Goal of Therapy:  INR 2-3 Heparin level 0.3-0.7 units/ml Monitor platelets by anticoagulation protocol: Yes   Plan:  Heparin level elevated will decrease heparin to 2300 units/hr. Will obtain heparin level at 2000.   Pharmacy will continue to monitor and adjust per consult.   6/28:  HL @ 22:00 = 0.49 Will continue this pt on current rate and recheck HL in 6 hrs on 6/29 @ 0400.   6/29: HL @ 0400 = 0.55 Will continue this pt on current rate and recheck HL on 6/30 with AM labs.   6/30: HL @ 0443 = 0.61 Will continue this pt on current rate and recheck HL on  7/1 with AM labs.   7/01: HL @ 0420 = 0.94 Will decrease heparin gtt rate to 2100 units/hr and recheck HL 6 hrs after rate change.   Wynette Jersey D 05/08/2018

## 2018-05-08 NOTE — Progress Notes (Signed)
Pt is on trach collar 10L at 40%

## 2018-05-08 NOTE — Progress Notes (Signed)
Nutrition Follow-up  DOCUMENTATION CODES:   Morbid obesity  INTERVENTION:  Initiate new goal TF regimen of Nepro at 65 mL/hr (1560 mL goal daily volume) + Pro-Stat 30 mL once daily via NGT. Provides 2908 kcal, 141 grams of protein, 251 grams of carbohydrates, 20 grams of fiber, 1654 mg potassium, 1123 mg phosphorus, and 1139 mL H2O daily.  With previous regimen patient was receiving 4536 mg potassium daily, so this should help lower potassium levels.  With free water flush of 250 mL Q6hrs patient will receive a total of 2139 mL H2O daily including water in tube feeding.  NUTRITION DIAGNOSIS:   Inadequate oral intake related to inability to eat as evidenced by NPO status.  Ongoing - addressing with TF regimen.  GOAL:   Provide needs based on ASPEN/SCCM guidelines  Met with TF regimen.  MONITOR:   Vent status, Labs, Weight trends, Skin, TF tolerance, I & O's  REASON FOR ASSESSMENT:   Ventilator, Consult Enteral/tube feeding initiation and management  ASSESSMENT:   47 year old male with PMHx of A-fib s/p ablation and mechanical aortic valve replacement on warfarin, HTN, DM type 2, CHF, OSA, anxiety, CKD, HLD, pancreatitis who presented with lower back pain and sciatica. While in MRI on 5/31 suite patient suffered pulseless V. tach cardiac arrest with 5 minutes ACLS before ROSC obtained, and was intubated.   -Patient s/p placement of tracheostomy tube on 6/18. OGT was removed that day and replaced with an NGT. -PEG tube was unable to be placed on 6/21 due to inability to find good one-to-one indentation and transillumination for safe placement. -Rectal tube was removed on 6/22. -Patient was on trach collar on 6/25 for one hour with trach deflated. Placed back on vent to rest overnight. -IR attempted to place G-tube on 6/27. Tube was unable to be placed as a safe percutaneous window could not be identified. -Per chart patient is not a good candidate for surgically placed  G-tube.  Patient has been tolerating tracheostomy collar. Plan is to leave patient on tracheostomy collar until he needs to rest. RD contacted by Nephrology. Plan is to switch patient from Glucerna 1.5 Cal to Nepro to help reduce intake of potassium. Plan is for SLP consult to begin working on PO intake for patient as permanent G-tube will not be an option at this time.   Access: NGT placed 6/18; still terminates in gastric body per abdominal x-ray 6/22; 65 cm at right nare  TF: pt has been tolerating Glucerna 1.5 Cal at 75 mL/hr  Medications reviewed and include: clonazepam, diltiazem, famotidine, fentanyl patch, free water flush 250 mL Q6hrs, Lasix 60 mg IV once today, Novolog 0-20 units Q4hrs (received 33 units past 24 hrs), Novolog 10 units IV once today, Lantus 85 units BID, Seroquel QHS, heparin gtt, sodium bicarbonate in sterile water at 30 mL/hr.  Labs reviewed: CBG 141-180, Potassium 6.7 (increase from 6.2 earlier this AM), CO2 34, BUN 57 (trending down), Creatinine 2.28 (trending up), eGFR 37 (trending down). Potassium has been trending up over the past week.  I/O: 2760 mL UOP yesterday (0.7 mL/kg/hr); 1 BM yesterday  Weight trend: 169.3 kg on 7/1; wt trending back down; patient now +1.5 kg from admission  Discussed with RN and on rounds.  Diet Order:   Diet Order           Diet NPO time specified  Diet effective midnight          EDUCATION NEEDS:   No education needs have  been identified at this time  Skin:  Skin Assessment: Skin Integrity Issues: Skin Integrity Issues:: Incisions Stage II: N/A Incisions: closed incision to neck  Last BM:  05/08/2018 - medium type 6  Height:   Ht Readings from Last 1 Encounters:  05/08/18 5' 10"  (1.778 m)    Weight:   Wt Readings from Last 1 Encounters:  05/08/18 (!) 373 lb 3.8 oz (169.3 kg)    Ideal Body Weight:  75.5 kg  BMI:  Body mass index is 53.55 kg/m.  Estimated Nutritional Needs:   Kcal:  5625-6389 (MSJ x  1.1-1.2)  Protein:  140-170 grams (1.85-2.25 grams/kg IBW; 0.8-1 grams/kg admission wt)  Fluid:  1.8-2.3 L/day (25-30 mL/kg IBW)  Willey Blade, MS, RD, LDN Office: [phone] Pager: [phone] After Hours/Weekend Pager: [phone]

## 2018-05-08 NOTE — Progress Notes (Signed)
OT Cancellation Note  Patient Details Name: Alexander RiffleOtis Leon Alexander MRN: [id number] DOB: 08-Jan-1971   Cancelled Treatment:    Reason Eval/Treat Not Completed: Medical issues which prohibited therapy. Chart reviewed. Pt noted with elevated potassium again, now up to 6.2 from 5.3 previous date. Per protocol, pt contraindicated for therapy at this time. Will re-attempt at later date/time as pt is medically appropriate.   Richrd PrimeJamie Stiller, MPH, MS, OTR/L ascom [phone] 05/08/18, 8:09 AM

## 2018-05-08 NOTE — Progress Notes (Signed)
ANTICOAGULATION CONSULT NOTE   Pharmacy Consult for heparin drip management Indication: atrial fibrillation and aortic valve replacement   Allergies  Allergen Reactions  . Lexapro [Escitalopram Oxalate] Swelling    Patient Measurements: Height: 5\' 10"  (177.8 cm) Weight: (!) 373 lb 3.8 oz (169.3 kg) IBW/kg (Calculated) : 73 Heparin Dosing Weight: 121 kg (updated 6/21)  Vital Signs: Temp: 98.4 F (36.9 C) (07/01 0800) Temp Source: Oral (07/01 0800) BP: 133/88 (07/01 0900) Pulse Rate: 104 (07/01 0900)  Labs: Recent Labs    05/06/18 0435 05/06/18 1836 05/07/18 0443 05/08/18 0420 05/08/18 1130  HGB 11.6*  --  11.5*  --  11.7*  HCT 35.7*  --  35.3*  --  36.0*  PLT 280  --  279  --  265  HEPARINUNFRC 0.55  --  0.61 0.94* 0.71*  CREATININE 2.20* 2.14* 2.17* 2.28*  --     Estimated Creatinine Clearance: 61.8 mL/min (A) (by C-G formula based on SCr of 2.28 mg/dL (H)).   Medical History: Past Medical History:  Diagnosis Date  . Allergy   . Anxiety   . Arrhythmia   . Arthritis   . Atrial fibrillation (HCC)   . CHF (congestive heart failure) (HCC)   . CKD (chronic kidney disease)   . Diabetes mellitus without complication (HCC)   . Diabetes mellitus, type II (HCC)   . Heart failure (HCC)   . Hyperlipidemia   . Hypertension   . Pancreatitis   . Sleep apnea     Medications:  Scheduled:  . amLODipine  10 mg Oral Daily  . budesonide (PULMICORT) nebulizer solution  0.25 mg Nebulization Q6H  . chlorhexidine  15 mL Mouth Rinse BID  . clonazePAM  1 mg Per Tube Daily  . clonazePAM  2 mg Oral QHS  . diltiazem  60 mg Oral Q6H  . famotidine  20 mg Per Tube Daily  . fentaNYL  150 mcg Transdermal Q72H  . free water  250 mL Per Tube Q6H  . insulin aspart  0-20 Units Subcutaneous Q4H  . insulin glargine  85 Units Subcutaneous BID  . ipratropium-albuterol  3 mL Nebulization Q6H  . mouth rinse  15 mL Mouth Rinse 10 times per day  . metoprolol tartrate  50 mg Per Tube BID   . QUEtiapine  100 mg Per Tube QHS  . sodium chloride flush  10-40 mL Intracatheter Q12H   Infusions:  . feeding supplement (GLUCERNA 1.5 CAL) 75 mL/hr at 05/08/18 0800  . heparin 2,100 Units/hr (05/08/18 0800)    Assessment: Pharmacy consulted for warfarin and heparin drip management for 47 yo male admitted to the ICU s/p cardiac arrest. Patient has history significant for aortic valve replacement, atrial fibrillation and CHF. Head CT is negative for infarction or hemorrhage.   Patient takes warfarin 5mg  daily as an outpatient for goal INR 2.5-3.5.    Goal of Therapy:  INR 2-3 Heparin level 0.3-0.7 units/ml Monitor platelets by anticoagulation protocol: Yes   Plan:  Will decrease heparin infusion to 1900 units/hr and recheck a HL in 6 hours.   Valentina GuChristy, Deja Kaigler D 05/08/2018

## 2018-05-08 NOTE — Progress Notes (Signed)
Physical Therapy Treatment Patient Details Name: Alexander Alexander MRN: [id number] DOB: May 07, 1971 Today's Date: 05/08/2018    History of Present Illness      PT Comments    Chart reviewed. Pt demonstrates potassium levels elevated to 6.2 and trending up. PT will continue to monitor pt and attempt treatment on next date pt medically appropriate.   Follow Up Recommendations        Equipment Recommendations       Recommendations for Other Services       Precautions / Restrictions      Mobility  Bed Mobility                  Transfers                    Ambulation/Gait                 Stairs             Wheelchair Mobility    Modified Rankin (Stroke Patients Only)       Balance                                            Cognition                                              Exercises      General Comments        Pertinent Vitals/Pain      Home Living                      Prior Function            PT Goals (current goals can now be found in the care plan section)      Frequency           PT Plan      Co-evaluation              AM-PAC PT "6 Clicks" Daily Activity  Outcome Measure                   End of Session               Time:  -     Charges:                       G CodesRon Alexander:       Alexander Alexander, SPT    Alexander Alexander 05/08/2018, 8:32 AM

## 2018-05-08 NOTE — Progress Notes (Signed)
[facility], [place] 05/08/18  Subjective:   Patient known to our practice from outpatient followed for CKD Consult for hyperkalema This admission for Code during MRI (done for evaluation of sciatica).  Complicated hospital stay; Intubated and required vent support.  At present requiring trach collar Potassium trend summarized below   Objective:  Vital signs in last 24 hours:  Temp:  [98.4 F (36.9 C)-98.7 F (37.1 C)] 98.4 F (36.9 C) (07/01 0800) Pulse Rate:  [88-186] 106 (07/01 1200) Resp:  [13-33] 20 (07/01 1200) BP: (103-173)/(78-121) 144/95 (07/01 1200) SpO2:  [86 %-97 %] 95 % (07/01 1317) FiO2 (%):  [40 %] 40 % (07/01 1317) Weight:  [169.3 kg (373 lb 3.8 oz)] 169.3 kg (373 lb 3.8 oz) (07/01 0453)  Weight change: 1.5 kg (3 lb 4.9 oz) Filed Weights   05/06/18 0500 05/07/18 0442 05/08/18 0453  Weight: (!) 178 kg (392 lb 6.7 oz) (!) 167.8 kg (369 lb 14.9 oz) (!) 169.3 kg (373 lb 3.8 oz)    Intake/Output:    Intake/Output Summary (Last 24 hours) at 05/08/2018 1403 Last data filed at 05/08/2018 1241 Gross per 24 hour  Intake 5320.73 ml  Output 2660 ml  Net 2660.73 ml     Physical Exam: General: NAD,   HEENT Moist oral mucus membranes  Neck Trach in place  Pulm/lungs Coarse breath sounds  CVS/Heart regular  Abdomen:  Soft, NT, obese  Extremities: + edema  Neurologic: Lethargic, follows few simple commands  Skin: Scabbed Lesions on right hand   Foley in place       Basic Metabolic Panel:  Recent Labs  Lab 05/02/18 0551  05/04/18 1046 05/06/18 0435 05/06/18 1836 05/07/18 0443 05/08/18 0420 05/08/18 1130  NA 141   < > 139 136 139 139 138  --   K 4.6   < > 5.1 5.3* 5.5* 5.3* 6.2* 6.7*  CL 107   < > 104 100 101 100 98  --   CO2 25   < > 28 29 29  32 34*  --   GLUCOSE 130*   < > 87 171* 152* 106* 166*  --   BUN 68*   < > 65* 62* 60* 59* 57*  --   CREATININE 2.46*   < > 2.25* 2.20* 2.14* 2.17* 2.28*  --   CALCIUM 8.6*   < >  9.0 9.0 9.5 9.2 9.2  --   MG  --   --   --  2.1  --   --   --   --   PHOS 5.2*  --   --  3.7  --   --   --   --    < > = values in this interval not displayed.     CBC: Recent Labs  Lab 05/03/18 0424 05/05/18 0343 05/06/18 0435 05/07/18 0443 05/08/18 1130  WBC 9.5 9.5 9.3 8.4 7.0  NEUTROABS 5.6  --   --   --   --   HGB 11.4* 11.2* 11.6* 11.5* 11.7*  HCT 34.6* 34.6* 35.7* 35.3* 36.0*  MCV 85.1 84.6 84.5 83.9 84.1  PLT 231 268 280 279 265     No results found for: HEPBSAG, HEPBSAB, HEPBIGM    Microbiology:  No results found for this or any previous visit (from the past 240 hour(s)).  Coagulation Studies: No results for input(s): LABPROT, INR in the last 72 hours.  Urinalysis: No results for input(s): COLORURINE, LABSPEC, PHURINE, GLUCOSEU, HGBUR, BILIRUBINUR, KETONESUR, PROTEINUR,  UROBILINOGEN, NITRITE, LEUKOCYTESUR in the last 72 hours.  Invalid input(s): APPERANCEUR    Imaging: No results found.   Medications:   . feeding supplement (GLUCERNA 1.5 CAL) 1,000 mL (05/08/18 1100)  . heparin 1,900 Units/hr (05/08/18 1241)  .  sodium bicarbonate (isotonic) infusion in sterile water     . amLODipine  10 mg Oral Daily  . budesonide (PULMICORT) nebulizer solution  0.25 mg Nebulization Q6H  . chlorhexidine  15 mL Mouth Rinse BID  . clonazePAM  1 mg Per Tube Daily  . clonazePAM  2 mg Oral QHS  . dextrose  1 ampule Intravenous Once  . diltiazem  60 mg Oral Q6H  . famotidine  20 mg Per Tube Daily  . fentaNYL  150 mcg Transdermal Q72H  . free water  250 mL Per Tube Q6H  . furosemide  60 mg Intravenous Once  . insulin aspart  0-20 Units Subcutaneous Q4H  . insulin aspart  10 Units Intravenous Once  . insulin glargine  85 Units Subcutaneous BID  . ipratropium-albuterol  3 mL Nebulization Q6H  . mouth rinse  15 mL Mouth Rinse 10 times per day  . metoprolol tartrate  50 mg Per Tube BID  . QUEtiapine  100 mg Per Tube QHS  . sodium chloride flush  10-40 mL  Intracatheter Q12H   acetaminophen, fentaNYL (SUBLIMAZE) injection, hydrALAZINE, loperamide, metoprolol tartrate, sodium chloride flush  Assessment/ Plan:  47 y.o. [place] male  with chronic kidney disease, Atrial fibrillation, diabetes with peripheral neuropathy, chronic systolic congestive heart failure,  mechanical aortic valve, surgery was [place] October [place] [employer] obstructive sleep apnea(not compliant with CPAP)  1. Hyperkalemia 2. CKD st 3 Baseline Cr 1.89/GFR 47 04/14/18 3. ARF 4. Edema  Hyperkalemia is likely related to tube feeds (glucerna has 380 mg potassium in 8 oz) Agree with treatment with shifting measures, bicarbonate infusion, Discussed with dietitian to change to a low potassium tube feeding Consider veltassa until K is corrected     LOS: 31 Gilverto Dileonardo 7/1/20192:03 PM  [facility] [place], [place] [phone]  Note: This note was prepared with Dragon dictation. Any transcription errors are unintentional

## 2018-05-08 NOTE — Progress Notes (Signed)
Pt placed 40% trach collar, tolerating well at this time, sats 94%, respiratory rate 20/min, RN notified.

## 2018-05-08 NOTE — Progress Notes (Signed)
ANTICOAGULATION CONSULT NOTE   Pharmacy Consult for heparin drip management Indication: atrial fibrillation and aortic valve replacement   Allergies  Allergen Reactions  . Lexapro [Escitalopram Oxalate] Swelling    Patient Measurements: Height: 5\' 10"  (177.8 cm) Weight: (!) 373 lb 3.8 oz (169.3 kg) IBW/kg (Calculated) : 73 Heparin Dosing Weight: 121 kg (updated 6/21)  Vital Signs: Temp: 98.6 F (37 C) (07/01 1900) Temp Source: Oral (07/01 1900) BP: 137/104 (07/01 1900) Pulse Rate: 96 (07/01 1900)  Labs: Recent Labs    05/06/18 1836 05/07/18 0443 05/08/18 0420 05/08/18 1130 05/08/18 1732  HGB  --  11.5*  --  11.7* 11.8*  HCT  --  35.3*  --  36.0* 35.8*  PLT  --  279  --  265 267  HEPARINUNFRC  --  0.61 0.94* 0.71* 0.60  CREATININE 2.14* 2.17* 2.28*  --   --     Estimated Creatinine Clearance: 61.8 mL/min (A) (by C-G formula based on SCr of 2.28 mg/dL (H)).   Medical History: Past Medical History:  Diagnosis Date  . Allergy   . Anxiety   . Arrhythmia   . Arthritis   . Atrial fibrillation (HCC)   . CHF (congestive heart failure) (HCC)   . CKD (chronic kidney disease)   . Diabetes mellitus without complication (HCC)   . Diabetes mellitus, type II (HCC)   . Heart failure (HCC)   . Hyperlipidemia   . Hypertension   . Pancreatitis   . Sleep apnea     Medications:  Scheduled:  . amLODipine  10 mg Oral Daily  . budesonide (PULMICORT) nebulizer solution  0.25 mg Nebulization Q6H  . chlorhexidine  15 mL Mouth Rinse BID  . clonazePAM  1 mg Per Tube Daily  . clonazePAM  2 mg Oral QHS  . diltiazem  60 mg Oral Q6H  . famotidine  20 mg Per Tube Daily  . feeding supplement (PRO-STAT SUGAR FREE 64)  30 mL Per Tube Daily  . fentaNYL  150 mcg Transdermal Q72H  . free water  250 mL Per Tube Q6H  . insulin aspart  0-20 Units Subcutaneous Q4H  . insulin glargine  85 Units Subcutaneous BID  . ipratropium-albuterol  3 mL Nebulization Q6H  . mouth rinse  15 mL Mouth  Rinse 10 times per day  . metoprolol tartrate  50 mg Per Tube BID  . patiromer  8.4 g Oral Daily  . QUEtiapine  100 mg Per Tube QHS  . sodium chloride flush  10-40 mL Intracatheter Q12H   Infusions:  . feeding supplement (NEPRO CARB STEADY) 1,000 mL (05/08/18 1929)  . heparin 1,900 Units/hr (05/08/18 1929)  .  sodium bicarbonate (isotonic) infusion in sterile water 30 mL/hr at 05/08/18 1929    Assessment: Pharmacy consulted for warfarin and heparin drip management for 47 yo male admitted to the ICU s/p cardiac arrest. Patient has history significant for aortic valve replacement, atrial fibrillation and CHF. Head CT is negative for infarction or hemorrhage.   Patient takes warfarin 5mg  daily as an outpatient for goal INR 2.5-3.5.    Goal of Therapy:  INR 2-3 Heparin level 0.3-0.7 units/ml Monitor platelets by anticoagulation protocol: Yes   Plan:  Will decrease heparin infusion to 1900 units/hr and recheck a HL in 6 hours.   7/1 HL@ 1732= 0.60. wil continue current drip rate and f/u with am labs.  Dereona Kolodny A 05/08/2018

## 2018-05-08 NOTE — Progress Notes (Addendum)
CRITICAL CARE NOTE  CC  follow up respiratory failure  SUBJECTIVE On TCT Plan for speech evaluation Unable to obtain feeding tube Vent support at night     BP (!) 137/97   Pulse (!) 101   Temp 98.7 F (37.1 C)   Resp 16   Ht 5\' 10"  (1.778 m)   Wt (!) 373 lb 3.8 oz (169.3 kg)   SpO2 94%   BMI 53.55 kg/m    REVIEW OF SYSTEMS Patient lethargic and sleepy No acute issues overnight   PHYSICAL EXAMINATION:  GENERAL:NAD, on TCT HEAD: Normocephalic, atraumatic.  EYES: Pupils equal, round, reactive to light.  No scleral icterus.  MOUTH: Moist mucosal membrane. NECK: Supple. No thyromegaly. No nodules. No JVD.  PULMONARY: +rhonchi CARDIOVASCULAR: S1 and S2. Regular rate and rhythm. No murmurs, rubs, or gallops.  GASTROINTESTINAL: Soft, nontender, -distended. No masses. Positive bowel sounds. No hepatosplenomegaly.  MUSCULOSKELETAL: No swelling, clubbing, or edema.  NEUROLOGIC: sleepy,lethargic this AM SKIN:intact,warm,dry  ASSESSMENT AND PLAN    Severe Hypoxic and Hypercapnic Respiratory Failure-slowly resolving -TCT as tolerated Vent as needed fio2 as needed  NEUROLOGY Follow  Up as needed  Septic shock Resolved  CARDIAC SD  monitoring continue AC for aortic valve  Continue Nutritional support via NGT  DVT/GI PRX ordered TRANSFUSIONS AS NEEDED MONITOR FSBS ASSESS the need for LABS as needed   Lucie LeatherKurian David Latora Quarry, M.D.  [facility]  Medical Director [facility] Medical Director [facility] Cardio[facility] Department

## 2018-05-08 NOTE — Care Management (Addendum)
Plan for speech evaluation; Unable to obtain feeding tube. Erika with [employer] updated and requested update on insurance auth for LTAC- no current updated from insurance.

## 2018-05-09 LAB — CBC
HCT: 34.4 % — ABNORMAL LOW (ref 40.0–52.0)
Hemoglobin: 11.3 g/dL — ABNORMAL LOW (ref 13.0–18.0)
MCH: 27.6 pg (ref 26.0–34.0)
MCHC: 32.9 g/dL (ref 32.0–36.0)
MCV: 83.6 fL (ref 80.0–100.0)
Platelets: 246 10*3/uL (ref 150–440)
RBC: 4.12 MIL/uL — ABNORMAL LOW (ref 4.40–5.90)
RDW: 19 % — AB (ref 11.5–14.5)
WBC: 6.4 10*3/uL (ref 3.8–10.6)

## 2018-05-09 LAB — GLUCOSE, CAPILLARY
GLUCOSE-CAPILLARY: 106 mg/dL — AB (ref 70–99)
GLUCOSE-CAPILLARY: 147 mg/dL — AB (ref 70–99)
Glucose-Capillary: 116 mg/dL — ABNORMAL HIGH (ref 70–99)
Glucose-Capillary: 117 mg/dL — ABNORMAL HIGH (ref 70–99)
Glucose-Capillary: 129 mg/dL — ABNORMAL HIGH (ref 70–99)
Glucose-Capillary: 138 mg/dL — ABNORMAL HIGH (ref 70–99)
Glucose-Capillary: 140 mg/dL — ABNORMAL HIGH (ref 70–99)

## 2018-05-09 LAB — COMPREHENSIVE METABOLIC PANEL
ALK PHOS: 64 U/L (ref 38–126)
ALT: 21 U/L (ref 0–44)
ANION GAP: 8 (ref 5–15)
AST: 22 U/L (ref 15–41)
Albumin: 3 g/dL — ABNORMAL LOW (ref 3.5–5.0)
BUN: 57 mg/dL — ABNORMAL HIGH (ref 6–20)
CO2: 38 mmol/L — AB (ref 22–32)
Calcium: 9 mg/dL (ref 8.9–10.3)
Chloride: 91 mmol/L — ABNORMAL LOW (ref 98–111)
Creatinine, Ser: 2.28 mg/dL — ABNORMAL HIGH (ref 0.61–1.24)
GFR calc non Af Amer: 32 mL/min — ABNORMAL LOW (ref >60)
GFR, EST AFRICAN AMERICAN: 37 mL/min — AB (ref >60)
Glucose, Bld: 142 mg/dL — ABNORMAL HIGH (ref 70–99)
Potassium: 5.1 mmol/L (ref 3.5–5.1)
SODIUM: 137 mmol/L (ref 135–145)
Total Bilirubin: 0.9 mg/dL (ref 0.3–1.2)
Total Protein: 8.5 g/dL — ABNORMAL HIGH (ref 6.5–8.1)

## 2018-05-09 LAB — HEPARIN LEVEL (UNFRACTIONATED): HEPARIN UNFRACTIONATED: 0.49 [IU]/mL (ref 0.30–0.70)

## 2018-05-09 MED ORDER — FLUVOXAMINE MALEATE 50 MG PO TABS
25.0000 mg | ORAL_TABLET | Freq: Every day | ORAL | Status: DC
  Administered 2018-05-09 – 2018-05-18 (×8): 25 mg
  Filled 2018-05-09 (×12): qty 1

## 2018-05-09 NOTE — Progress Notes (Signed)
[facility], [place] 05/09/18  Subjective:   Patient known to our practice from outpatient followed for CKD Consult for hyperkalema Patient admitted after Code during MRI (done for evaluation of sciatica).  Complicated hospital stay; Intubated and required vent support, trach placement  At present requiring trach collar UOP 4250 cc Tube feeds changed to Nepro;    Objective:  Vital signs in last 24 hours:  Temp:  [98.2 F (36.8 C)-98.6 F (37 C)] 98.4 F (36.9 C) (07/02 0715) Pulse Rate:  [86-206] 118 (07/02 0900) Resp:  [12-26] 14 (07/02 0900) BP: (103-152)/(80-128) 141/101 (07/02 0900) SpO2:  [81 %-96 %] 92 % (07/02 0900) FiO2 (%):  [40 %] 40 % (07/02 0805) Weight:  [167.5 kg (369 lb 4.3 oz)] 167.5 kg (369 lb 4.3 oz) (07/02 0457)  Weight change: -1.8 kg (-3 lb 15.5 oz) Filed Weights   05/07/18 0442 05/08/18 0453 05/09/18 0457  Weight: (!) 167.8 kg (369 lb 14.9 oz) (!) 169.3 kg (373 lb 3.8 oz) (!) 167.5 kg (369 lb 4.3 oz)    Intake/Output:    Intake/Output Summary (Last 24 hours) at 05/09/2018 0948 Last data filed at 05/09/2018 0800 Gross per 24 hour  Intake 2323.95 ml  Output 4250 ml  Net -1926.05 ml     Physical Exam: General: NAD,   HEENT Moist oral mucus membranes  Neck Trach in place  Pulm/lungs Coarse breath sounds  CVS/Heart regular  Abdomen:  Soft, NT, obese  Extremities: + edema  Neurologic: Lethargic, follows few simple commands  Skin: Scabbed Lesions on right hand   Foley in place       Basic Metabolic Panel:  Recent Labs  Lab 05/06/18 0435 05/06/18 1836 05/07/18 0443 05/08/18 0420 05/08/18 1130 05/08/18 1933 05/09/18 0534  NA 136 139 139 138  --   --  137  K 5.3* 5.5* 5.3* 6.2* 6.7* 5.6* 5.1  CL 100 101 100 98  --   --  91*  CO2 29 29 32 34*  --   --  38*  GLUCOSE 171* 152* 106* 166*  --   --  142*  BUN 62* 60* 59* 57*  --   --  57*  CREATININE 2.20* 2.14* 2.17* 2.28*  --   --  2.28*  CALCIUM 9.0 9.5 9.2  9.2  --   --  9.0  MG 2.1  --   --   --   --   --   --   PHOS 3.7  --   --   --   --   --   --      CBC: Recent Labs  Lab 05/03/18 0424  05/06/18 0435 05/07/18 0443 05/08/18 1130 05/08/18 1732 05/09/18 0534  WBC 9.5   < > 9.3 8.4 7.0 6.5 6.4  NEUTROABS 5.6  --   --   --   --   --   --   HGB 11.4*   < > 11.6* 11.5* 11.7* 11.8* 11.3*  HCT 34.6*   < > 35.7* 35.3* 36.0* 35.8* 34.4*  MCV 85.1   < > 84.5 83.9 84.1 84.6 83.6  PLT 231   < > 280 279 265 267 246   < > = values in this interval not displayed.     No results found for: HEPBSAG, HEPBSAB, HEPBIGM    Microbiology:  No results found for this or any previous visit (from the past 240 hour(s)).  Coagulation Studies: No results for input(s): LABPROT, INR in  the last 72 hours.  Urinalysis: No results for input(s): COLORURINE, LABSPEC, PHURINE, GLUCOSEU, HGBUR, BILIRUBINUR, KETONESUR, PROTEINUR, UROBILINOGEN, NITRITE, LEUKOCYTESUR in the last 72 hours.  Invalid input(s): APPERANCEUR    Imaging: No results found.   Medications:   . feeding supplement (NEPRO CARB STEADY) 65 mL/hr at 05/09/18 0800  . heparin 1,900 Units/hr (05/09/18 0800)  .  sodium bicarbonate (isotonic) infusion in sterile water 30 mL/hr at 05/09/18 0800   . amLODipine  10 mg Oral Daily  . budesonide (PULMICORT) nebulizer solution  0.25 mg Nebulization Q6H  . chlorhexidine  15 mL Mouth Rinse BID  . clonazePAM  1 mg Per Tube Daily  . clonazePAM  2 mg Oral QHS  . diltiazem  60 mg Oral Q6H  . famotidine  20 mg Per Tube Daily  . feeding supplement (PRO-STAT SUGAR FREE 64)  30 mL Per Tube Daily  . fentaNYL  150 mcg Transdermal Q72H  . free water  250 mL Per Tube Q6H  . insulin aspart  0-20 Units Subcutaneous Q4H  . insulin glargine  85 Units Subcutaneous BID  . ipratropium-albuterol  3 mL Nebulization Q6H  . mouth rinse  15 mL Mouth Rinse 10 times per day  . metoprolol tartrate  50 mg Per Tube BID  . patiromer  8.4 g Oral Daily  . QUEtiapine   100 mg Per Tube QHS  . sodium chloride flush  10-40 mL Intracatheter Q12H   acetaminophen, fentaNYL (SUBLIMAZE) injection, hydrALAZINE, loperamide, metoprolol tartrate, sodium chloride flush  Assessment/ Plan:  47 y.o. African [place] male  with chronic kidney disease, Atrial fibrillation, diabetes with peripheral neuropathy, chronic systolic congestive heart failure,  mechanical aortic valve, surgery was done in October 2015 [place] [employer] obstructive sleep ap[place](not compliant with CPAP)  1. Hyperkalemia 2. CKD st 3 Baseline Cr 1.89/GFR 47 04/14/18 3. ARF 4. Edema  Hyperkalemia was likely related to tube feeds (glucerna has 380 mg potassium in 8 oz) Potassium Level has now corrected Good UOP; S Creatinine is stable Will continue to monitor     LOS: 32 Alexander Alexander 7/2/20199:48 AM  [facility] [place], [place] [phone]  Note: This note was prepared with Dragon dictation. Any transcription errors are unintentional

## 2018-05-09 NOTE — Progress Notes (Signed)
Physical Therapy Treatment Patient Details Name: Alexander Alexander MRN: [id number] DOB: 03/18/1971 Today's Date: 05/09/2018    History of Present Illness Pt. is a 47 y.o. male with PMH of atrial fibrillation, status post ablation and aortic valve replacement on long term warfarin, CHF, CKD, diabetes, obstructive sleep apnea and hypertension. Pt. had a pulseless VT cardiac arrest in MRI when he was being evaluated for back pain and sciatica, subsequently intubated in the intensive care unit. Intubated from 5/31-6/18 when trach placed. Pt for PEG placement tomorrow potentially. Of note, pt also with L LE DVT currently on heparin drip diagnosed on 6/21. PEG tube attempted yesterday, however unsuccessful. VItal go tilt bed obtained this date.    PT Comments    Pt is making limited progress towards goals. Pt now on regular hospital bed. Discussed with RN to transition back to tilt bed for further mobility progress. Pt agreeable. Pt able to tolerate supine there-ex and rolling, however too weak for OOB mobility at this time. Pt on trach collar and able to tolerate exertion well. Fatigues quickly with there-ex, needs cues and motivation to continue. Will continue to progress as able.   Follow Up Recommendations  SNF;LTACH     Equipment Recommendations  (TBD at next venue of care)    Recommendations for Other Services       Precautions / Restrictions Precautions Precautions: Fall Restrictions Weight Bearing Restrictions: No    Mobility  Bed Mobility Overal bed mobility: Needs Assistance Bed Mobility: Rolling Rolling: Mod assist;Max assist         General bed mobility comments: able to roll twice to R side with heavy assistance. Disassociation noted between trunk and B LEs. Needs heavy cues for sequencing  Transfers                 General transfer comment: unable  Ambulation/Gait                 Stairs             Wheelchair Mobility    Modified Rankin  (Stroke Patients Only)       Balance                                            Cognition Arousal/Alertness: Awake/alert Behavior During Therapy: Flat affect Overall Cognitive Status: Within Functional Limits for tasks assessed                                        Exercises Other Exercises Other Exercises: supine ther-ex performed on B LE and B UE including resisted shoulder press and elbow flexion/extension. Used weights in B hands. Also performed resisted heel slides, SAQ, and SLRs. All ther-ex performed x 10 reps with min assist. Pt fatigues quickly after 4-5 reps and needs cues for contining.    General Comments        Pertinent Vitals/Pain Pain Assessment: No/denies pain    Home Living                      Prior Function            PT Goals (current goals can now be found in the care plan section) Acute Rehab PT Goals Patient Stated Goal: wants to get up  out of the bed PT Goal Formulation: Patient unable to participate in goal setting Time For Goal Achievement: 05/17/18 Potential to Achieve Goals: Fair Progress towards PT goals: Progressing toward goals    Frequency    Min 2X/week      PT Plan Current plan remains appropriate    Co-evaluation              AM-PAC PT "6 Clicks" Daily Activity  Outcome Measure  Difficulty turning over in bed (including adjusting bedclothes, sheets and blankets)?: Unable Difficulty moving from lying on back to sitting on the side of the bed? : Unable Difficulty sitting down on and standing up from a chair with arms (e.g., wheelchair, bedside commode, etc,.)?: Unable Help needed moving to and from a bed to chair (including a wheelchair)?: Total Help needed walking in hospital room?: Total Help needed climbing 3-5 steps with a railing? : Total 6 Click Score: 6    End of Session Equipment Utilized During Treatment: (trach) Activity Tolerance: Patient tolerated treatment  well Patient left: in bed Nurse Communication: Mobility status PT Visit Diagnosis: Muscle weakness (generalized) (M62.81);Difficulty in walking, not elsewhere classified (R26.2);Other symptoms and signs involving the nervous system (R29.898)     Time: [id number] PT Time Calculation (min) (ACUTE ONLY): 38 min  Charges:  $Therapeutic Exercise: 23-37 mins $Therapeutic Activity: 8-22 mins                    G Codes:       Elizabeth PalauStephanie Martha Ellerby, PT, DPT [phone]    Sreshta Cressler 05/09/2018, 4:59 PM

## 2018-05-09 NOTE — Progress Notes (Signed)
ANTICOAGULATION CONSULT NOTE   Pharmacy Consult for heparin drip management Indication: atrial fibrillation and aortic valve replacement   Allergies  Allergen Reactions  . Lexapro [Escitalopram Oxalate] Swelling    Patient Measurements: Height: 5\' 10"  (177.8 cm) Weight: (!) 369 lb 4.3 oz (167.5 kg) IBW/kg (Calculated) : 73 Heparin Dosing Weight: 121 kg (updated 6/21)  Vital Signs: Temp: 98.2 F (36.8 C) (07/02 0400) Temp Source: Oral (07/02 0400) BP: 150/107 (07/02 0600) Pulse Rate: 104 (07/02 0600)  Labs: Recent Labs    05/07/18 0443 05/08/18 0420 05/08/18 1130 05/08/18 1732 05/09/18 0534 05/09/18 0606  HGB 11.5*  --  11.7* 11.8* 11.3*  --   HCT 35.3*  --  36.0* 35.8* 34.4*  --   PLT 279  --  265 267 246  --   HEPARINUNFRC 0.61 0.94* 0.71* 0.60  --  0.49  CREATININE 2.17* 2.28*  --   --  2.28*  --     Estimated Creatinine Clearance: 61.4 mL/min (A) (by C-G formula based on SCr of 2.28 mg/dL (H)).   Medical History: Past Medical History:  Diagnosis Date  . Allergy   . Anxiety   . Arrhythmia   . Arthritis   . Atrial fibrillation (HCC)   . CHF (congestive heart failure) (HCC)   . CKD (chronic kidney disease)   . Diabetes mellitus without complication (HCC)   . Diabetes mellitus, type II (HCC)   . Heart failure (HCC)   . Hyperlipidemia   . Hypertension   . Pancreatitis   . Sleep apnea     Medications:  Scheduled:  . amLODipine  10 mg Oral Daily  . budesonide (PULMICORT) nebulizer solution  0.25 mg Nebulization Q6H  . chlorhexidine  15 mL Mouth Rinse BID  . clonazePAM  1 mg Per Tube Daily  . clonazePAM  2 mg Oral QHS  . diltiazem  60 mg Oral Q6H  . famotidine  20 mg Per Tube Daily  . feeding supplement (PRO-STAT SUGAR FREE 64)  30 mL Per Tube Daily  . fentaNYL  150 mcg Transdermal Q72H  . free water  250 mL Per Tube Q6H  . insulin aspart  0-20 Units Subcutaneous Q4H  . insulin glargine  85 Units Subcutaneous BID  . ipratropium-albuterol  3 mL  Nebulization Q6H  . mouth rinse  15 mL Mouth Rinse 10 times per day  . metoprolol tartrate  50 mg Per Tube BID  . patiromer  8.4 g Oral Daily  . QUEtiapine  100 mg Per Tube QHS  . sodium chloride flush  10-40 mL Intracatheter Q12H   Infusions:  . feeding supplement (NEPRO CARB STEADY) 65 mL/hr at 05/09/18 0400  . heparin 1,900 Units/hr (05/09/18 0400)  .  sodium bicarbonate (isotonic) infusion in sterile water 30 mL/hr at 05/09/18 0400    Assessment: Pharmacy consulted for warfarin and heparin drip management for 47 yo male admitted to the ICU s/p cardiac arrest. Patient has history significant for aortic valve replacement, atrial fibrillation and CHF. Head CT is negative for infarction or hemorrhage.   Patient takes warfarin 5mg  daily as an outpatient for goal INR 2.5-3.5.    Goal of Therapy:  INR 2-3 Heparin level 0.3-0.7 units/ml Monitor platelets by anticoagulation protocol: Yes   Plan:  07/02 @ 0600 HL 0.49 therapeutic. Will continue rate of 1900 units/hr and will recheck w/ am labs. CBC stable.  Thomasene Rippleavid Mj Willis, PharmD, BCPS Clinical Pharmacist 05/09/2018

## 2018-05-09 NOTE — Progress Notes (Signed)
[facility] -  at [facility]   PATIENT NAME: Delia HeadyOtis Prunty    MR#:  [id number]  DATE OF BIRTH:  12/19/1970  SUBJECTIVE:  Follows commands On trach vent  REVIEW OF SYSTEMS:  Review of Systems  Unable to perform ROS: Intubated    DRUG ALLERGIES:   Allergies  Allergen Reactions  . Lexapro [Escitalopram Oxalate] Swelling    VITALS:  Blood pressure (!) 194/99, pulse (!) 116, temperature 98.4 F (36.9 C), temperature source Oral, resp. rate 19, height 5\' 10"  (1.778 m), weight (!) 167.5 kg (369 lb 4.3 oz), SpO2 92 %.  PHYSICAL EXAMINATION:  GENERAL:  47 y.o.-year-old patient lying in the bed with no acute distress. obese EYES: Pupils equal, round, reactive to light and accommodation. No scleral icterus. Extraocular muscles intact.  HEENT: Head atraumatic, normocephalic. Oropharynx and nasopharynx clear. Trach++ NECK:  Supple, no jugular venous distention. No thyroid enlargement, no tenderness.  LUNGS: Normal breath sounds bilaterally, no wheezing, rales,rhonchi or crepitation. No use of accessory muscles of respiration.  CARDIOVASCULAR: S1, S2 normal. No murmurs, rubs, or gallops.  ABDOMEN: Soft, nontender, nondistended. Bowel sounds present. No organomegaly or mass.  EXTREMITIES: ++ pedal edema, cyanosis, or clubbing.  NEUROLOGIC: moves extremities, no focal weakness PSYCHIATRIC: The patient is alert  SKIN: No obvious rash, lesion, or ulcer.   Physical Exam LABORATORY PANEL:   CBC Recent Labs  Lab 05/09/18 0534  WBC 6.4  HGB 11.3*  HCT 34.4*  PLT 246   ------------------------------------------------------------------------------------------------------------------  Chemistries  Recent Labs  Lab 05/06/18 0435  05/09/18 0534  NA 136   < > 137  K 5.3*   < > 5.1  CL 100   < > 91*  CO2 29   < > 38*  GLUCOSE 171*   < > 142*  BUN 62*   < > 57*  CREATININE 2.20*   < > 2.28*  CALCIUM 9.0   < > 9.0  MG 2.1  --   --   AST  --    < > 22  ALT   --    < > 21  ALKPHOS  --    < > 64  BILITOT  --    < > 0.9   < > = values in this interval not displayed.   ------------------------------------------------------------------------------------------------------------------  Cardiac Enzymes No results for input(s): TROPONINI in the last 168 hours. ------------------------------------------------------------------------------------------------------------------  RADIOLOGY:  No results found. ASSESSMENT AND PLAN:  47 year old male with past medical history significant for A. fib status post ablation, aortic valve replacement on warfarin, history of congestive heart failure, CKD, morbid obesity, diabetes and sleep apnea who was getting an outpatient MRI of his lower back had a cardiac arrest.  * Acute cardiopulmonary arrest Pulseless VT Difficult weaning from vent,s/p tracheostomy Continuemechanical ventilation with weaning as tolerated  PEG by  IR unsuccessful since safe window not found.  * AcuteMRSA/VAP Treated withcourse of Linezolid Continue trach collar/vent protocol   *Atrial fibrillationwith RVR Continuemetoprolol,Cardizem, amiodarone, continue Heparin drip.  * History of mechanical aortic valve replacement Onheparin drip  * Acute toxic metabolic encephalopathy  Neurology input appreciated  Started on Kepprafor possible partial seizures. Later stopped.  * Chronic diabetes mellitus type 2 Stable on current regiment  * Acute renal failurew/ CKD stage III Secondary toATN  * Acute septic shock, MRSA pneumonia Resolved complete 14 days linezolid  Disposition to LTAC when able  All the records are reviewed and case discussed with Care Management/Social Workerr. Management plans discussed with  the patient, family and they are in agreement.  CODE STATUS: Full  TOTAL TIME TAKING CARE OF THIS PATIENT: 25 minutes.   POSSIBLE D/C IN few DAYS, DEPENDING ON CLINICAL CONDITION.   Orie Fisherman  M.D on 05/09/2018   Between 7am to 6pm - Pager - [phone]  After 6pm go to [email or web address] - password [email or web address]  [facility]  Office  [phone]  CC: Primary care physician; Erasmo Downer, MD  Note: This dictation was prepared with Dragon dictation along with smaller phrase technology. Any transcriptional errors that result from this process are unintentional.

## 2018-05-09 NOTE — Progress Notes (Signed)
CRITICAL CARE NOTE  CC  follow up respiratory failure  SUBJECTIVE On TCT Plan for speech evaluation Unable to obtain feeding tube Vent support at night     BP (!) 137/94   Pulse (!) 104   Temp 98.2 F (36.8 C) (Oral)   Resp 14   Ht 5\' 10"  (1.778 m)   Wt (!) 369 lb 4.3 oz (167.5 kg)   SpO2 92%   BMI 52.98 kg/m    REVIEW OF SYSTEMS Patient lethargic and sleepy No acute issues overnight   PHYSICAL EXAMINATION:  GENERAL:NAD, on TCT HEAD: Normocephalic, atraumatic.  EYES: Pupils equal, round, reactive to light.  No scleral icterus.  MOUTH: Moist mucosal membrane. NECK: Supple. No thyromegaly. No nodules. No JVD.  PULMONARY: +rhonchi CARDIOVASCULAR: S1 and S2. Regular rate and rhythm. No murmurs, rubs, or gallops.  GASTROINTESTINAL: Soft, nontender, -distended. No masses. Positive bowel sounds. No hepatosplenomegaly.  MUSCULOSKELETAL: No swelling, clubbing, or edema.  NEUROLOGIC: sleepy,lethargic this AM SKIN:intact,warm,dry  ASSESSMENT AND PLAN    Severe Hypoxic and Hypercapnic Respiratory Failure-slowly resolving -TCT as tolerated Vent as needed fio2 as needed  NEUROLOGY Follow  Up as needed  Septic shock Resolved  CARDIAC SD  monitoring continue AC for aortic valve  Continue Nutritional support via NGT  DVT/GI PRX ordered TRANSFUSIONS AS NEEDED MONITOR FSBS ASSESS the need for LABS as needed   Tora KindredJohn Enzley Kitchens, DO    Patient ID: Pricilla RiffleOtis Leon Jago, male   DOB: 06-Aug-1971, 47 y.o.   MRN: [id number]

## 2018-05-10 LAB — CBC WITH DIFFERENTIAL/PLATELET
Basophils Absolute: 0 10*3/uL (ref 0–0.1)
Basophils Relative: 1 %
EOS ABS: 0.2 10*3/uL (ref 0–0.7)
Eosinophils Relative: 2 %
HCT: 37 % — ABNORMAL LOW (ref 40.0–52.0)
HEMOGLOBIN: 12.2 g/dL — AB (ref 13.0–18.0)
LYMPHS ABS: 1.2 10*3/uL (ref 1.0–3.6)
LYMPHS PCT: 17 %
MCH: 27.6 pg (ref 26.0–34.0)
MCHC: 32.8 g/dL (ref 32.0–36.0)
MCV: 84.1 fL (ref 80.0–100.0)
Monocytes Absolute: 1.1 10*3/uL — ABNORMAL HIGH (ref 0.2–1.0)
Monocytes Relative: 15 %
NEUTROS PCT: 65 %
Neutro Abs: 4.9 10*3/uL (ref 1.4–6.5)
Platelets: 251 10*3/uL (ref 150–440)
RBC: 4.41 MIL/uL (ref 4.40–5.90)
RDW: 18.4 % — ABNORMAL HIGH (ref 11.5–14.5)
WBC: 7.5 10*3/uL (ref 3.8–10.6)

## 2018-05-10 LAB — HEPARIN LEVEL (UNFRACTIONATED): Heparin Unfractionated: 0.4 IU/mL (ref 0.30–0.70)

## 2018-05-10 LAB — BASIC METABOLIC PANEL
Anion gap: 11 (ref 5–15)
BUN: 54 mg/dL — ABNORMAL HIGH (ref 6–20)
CHLORIDE: 92 mmol/L — AB (ref 98–111)
CO2: 33 mmol/L — AB (ref 22–32)
CREATININE: 2.23 mg/dL — AB (ref 0.61–1.24)
Calcium: 9.6 mg/dL (ref 8.9–10.3)
GFR calc non Af Amer: 33 mL/min — ABNORMAL LOW (ref >60)
GFR, EST AFRICAN AMERICAN: 38 mL/min — AB (ref >60)
Glucose, Bld: 178 mg/dL — ABNORMAL HIGH (ref 70–99)
POTASSIUM: 4.9 mmol/L (ref 3.5–5.1)
SODIUM: 136 mmol/L (ref 135–145)

## 2018-05-10 LAB — GLUCOSE, CAPILLARY
GLUCOSE-CAPILLARY: 132 mg/dL — AB (ref 70–99)
GLUCOSE-CAPILLARY: 168 mg/dL — AB (ref 70–99)
GLUCOSE-CAPILLARY: 173 mg/dL — AB (ref 70–99)
GLUCOSE-CAPILLARY: 253 mg/dL — AB (ref 70–99)
Glucose-Capillary: 192 mg/dL — ABNORMAL HIGH (ref 70–99)
Glucose-Capillary: 240 mg/dL — ABNORMAL HIGH (ref 70–99)

## 2018-05-10 MED ORDER — ONDANSETRON HCL 4 MG/2ML IJ SOLN
4.0000 mg | Freq: Once | INTRAMUSCULAR | Status: AC
  Administered 2018-05-10: 4 mg via INTRAVENOUS

## 2018-05-10 MED ORDER — ONDANSETRON HCL 4 MG/2ML IJ SOLN
INTRAMUSCULAR | Status: AC
  Administered 2018-05-10: 4 mg via INTRAVENOUS
  Filled 2018-05-10: qty 2

## 2018-05-10 NOTE — Progress Notes (Signed)
[facility], [place] 05/10/18  Subjective:   Patient known to our practice from outpatient followed for CKD Consult for hyperkalema Patient admitted after Code during MRI (done for evaluation of sciatica).  Complicated hospital stay; Intubated and required vent support, trach placement  At present requiring trach collar UOP 3150 cc Tube feeds changed to Nepro; currently @60  cc/hr   Objective:  Vital signs in last 24 hours:  Temp:  [98.4 F (36.9 C)-98.8 F (37.1 C)] 98.4 F (36.9 C) (07/03 0800) Pulse Rate:  [91-207] 111 (07/03 0800) Resp:  [13-20] 13 (07/03 0800) BP: (132-194)/(88-115) 165/105 (07/03 0800) SpO2:  [88 %-98 %] 93 % (07/03 0800) FiO2 (%):  [40 %] 40 % (07/03 0804) Weight:  [166.9 kg (367 lb 15.2 oz)] 166.9 kg (367 lb 15.2 oz) (07/03 0500)  Weight change: -0.6 kg (-1 lb 5.2 oz) Filed Weights   05/08/18 0453 05/09/18 0457 05/10/18 0500  Weight: (!) 169.3 kg (373 lb 3.8 oz) (!) 167.5 kg (369 lb 4.3 oz) (!) 166.9 kg (367 lb 15.2 oz)    Intake/Output:    Intake/Output Summary (Last 24 hours) at 05/10/2018 0909 Last data filed at 05/10/2018 0500 Gross per 24 hour  Intake 1690 ml  Output 2800 ml  Net -1110 ml     Physical Exam: General: NAD,   HEENT Moist oral mucus membranes  Neck Trach in place  Pulm/lungs Coarse breath sounds  CVS/Heart Irregular with mechanical valve click  Abdomen:  Soft, NT, obese  Extremities: + edema  Neurologic: Lethargic, follows few simple commands  Skin: Scabbed Lesions on right hand   Foley in place       Basic Metabolic Panel:  Recent Labs  Lab 05/06/18 0435 05/06/18 1836 05/07/18 0443 05/08/18 0420 05/08/18 1130 05/08/18 1933 05/09/18 0534 05/10/18 0745  NA 136 139 139 138  --   --  137 136  K 5.3* 5.5* 5.3* 6.2* 6.7* 5.6* 5.1 4.9  CL 100 101 100 98  --   --  91* 92*  CO2 29 29 32 34*  --   --  38* 33*  GLUCOSE 171* 152* 106* 166*  --   --  142* 178*  BUN 62* 60* 59* 57*  --    --  57* 54*  CREATININE 2.20* 2.14* 2.17* 2.28*  --   --  2.28* 2.23*  CALCIUM 9.0 9.5 9.2 9.2  --   --  9.0 9.6  MG 2.1  --   --   --   --   --   --   --   PHOS 3.7  --   --   --   --   --   --   --      CBC: Recent Labs  Lab 05/07/18 0443 05/08/18 1130 05/08/18 1732 05/09/18 0534 05/10/18 0745  WBC 8.4 7.0 6.5 6.4 7.5  NEUTROABS  --   --   --   --  4.9  HGB 11.5* 11.7* 11.8* 11.3* 12.2*  HCT 35.3* 36.0* 35.8* 34.4* 37.0*  MCV 83.9 84.1 84.6 83.6 84.1  PLT 279 265 267 246 251     No results found for: HEPBSAG, HEPBSAB, HEPBIGM    Microbiology:  No results found for this or any previous visit (from the past 240 hour(s)).  Coagulation Studies: No results for input(s): LABPROT, INR in the last 72 hours.  Urinalysis: No results for input(s): COLORURINE, LABSPEC, PHURINE, GLUCOSEU, HGBUR, BILIRUBINUR, KETONESUR, PROTEINUR, UROBILINOGEN, NITRITE, LEUKOCYTESUR in the  last 72 hours.  Invalid input(s): APPERANCEUR    Imaging: No results found.   Medications:   . feeding supplement (NEPRO CARB STEADY) 65 mL/hr at 05/10/18 0600  . heparin 1,900 Units/hr (05/10/18 0600)   . amLODipine  10 mg Oral Daily  . budesonide (PULMICORT) nebulizer solution  0.25 mg Nebulization Q6H  . chlorhexidine  15 mL Mouth Rinse BID  . clonazePAM  1 mg Per Tube Daily  . clonazePAM  2 mg Oral QHS  . diltiazem  60 mg Oral Q6H  . famotidine  20 mg Per Tube Daily  . feeding supplement (PRO-STAT SUGAR FREE 64)  30 mL Per Tube Daily  . fentaNYL  150 mcg Transdermal Q72H  . fluvoxaMINE  25 mg Per Tube QHS  . free water  250 mL Per Tube Q6H  . insulin aspart  0-20 Units Subcutaneous Q4H  . insulin glargine  85 Units Subcutaneous BID  . ipratropium-albuterol  3 mL Nebulization Q6H  . mouth rinse  15 mL Mouth Rinse 10 times per day  . metoprolol tartrate  50 mg Per Tube BID  . sodium chloride flush  10-40 mL Intracatheter Q12H   acetaminophen, fentaNYL (SUBLIMAZE) injection, hydrALAZINE,  loperamide, metoprolol tartrate, sodium chloride flush  Assessment/ Plan:  47 y.o. [place] male  with chronic kidney disease, Atrial fibrillation, diabetes with peripheral neuropathy, chronic systolic congestive heart failure,  mechanical aortic valve, surgery was done in October 2015 in [place], [place] obstructive sleep apnea(not compliant with CPAP)  1. Hyperkalemia 2. CKD st 3 Baseline Cr 1.89/GFR 47 on 04/14/18 3. ARF 4. Edema  Hyperkalemia was likely related to tube feeds  Potassium Level has now corrected Good UOP; S Creatinine is stable Will sign off. Please reconsult as necessary Out patient f/u for CKD     LOS: 33 Jaquita Bessire 7/3/20199:09 AM  [facility] [place], [place] [phone]  Note: This note was prepared with Dragon dictation. Any transcription errors are unintentional

## 2018-05-10 NOTE — Care Management (Addendum)
Patient  has been denied LTAC. Reason= member is in the weaning process and making progress. [facility]  [facility] to peer can be scheduled by calling [phone] and this offer expires today @ 4:30pm CST. This is for scheduling the peer to peer.MD updated.  Update 1149A: Per MD patient has been off vent for a couple days now and SLP to evaluate swallowing and ability to take PO. He does not feel that LTAC would be of best interest for patient at this point. Clydie [facility]Karen at [facility] updated.

## 2018-05-10 NOTE — Progress Notes (Signed)
Patient transferred from bed to chair with ceiling lift by this RN and two other staff members without difficulty. Pt tolerated move well. Pt sitting up in chair with feet down, reports being comfortable. Understands that he is not to try and stand up. This RN monitoring patient very closely while in chair. Told pt that we would aim to have him sit up in chair for at least 30 minutes, pt nodded head in understanding.

## 2018-05-10 NOTE — Progress Notes (Signed)
ANTICOAGULATION CONSULT NOTE   Pharmacy Consult for heparin drip management Indication: atrial fibrillation and aortic valve replacement   Allergies  Allergen Reactions  . Lexapro [Escitalopram Oxalate] Swelling    Patient Measurements: Height: 5\' 10"  (177.8 cm) Weight: (!) 367 lb 15.2 oz (166.9 kg) IBW/kg (Calculated) : 73 Heparin Dosing Weight: 121 kg (updated 6/21)  Vital Signs: Temp: 98.8 F (37.1 C) (07/03 0500) Temp Source: Oral (07/03 0500) BP: 156/98 (07/03 0600) Pulse Rate: 117 (07/03 0600)  Labs: Recent Labs    05/08/18 0420  05/08/18 1130 05/08/18 1732 05/09/18 0534 05/09/18 0606 05/10/18 0530  HGB  --    < > 11.7* 11.8* 11.3*  --   --   HCT  --   --  36.0* 35.8* 34.4*  --   --   PLT  --   --  265 267 246  --   --   HEPARINUNFRC 0.94*  --  0.71* 0.60  --  0.49 0.40  CREATININE 2.28*  --   --   --  2.28*  --   --    < > = values in this interval not displayed.    Estimated Creatinine Clearance: 61.3 mL/min (A) (by C-G formula based on SCr of 2.28 mg/dL (H)).   Medical History: Past Medical History:  Diagnosis Date  . Allergy   . Anxiety   . Arrhythmia   . Arthritis   . Atrial fibrillation (HCC)   . CHF (congestive heart failure) (HCC)   . CKD (chronic kidney disease)   . Diabetes mellitus without complication (HCC)   . Diabetes mellitus, type II (HCC)   . Heart failure (HCC)   . Hyperlipidemia   . Hypertension   . Pancreatitis   . Sleep apnea     Medications:  Scheduled:  . amLODipine  10 mg Oral Daily  . budesonide (PULMICORT) nebulizer solution  0.25 mg Nebulization Q6H  . chlorhexidine  15 mL Mouth Rinse BID  . clonazePAM  1 mg Per Tube Daily  . clonazePAM  2 mg Oral QHS  . diltiazem  60 mg Oral Q6H  . famotidine  20 mg Per Tube Daily  . feeding supplement (PRO-STAT SUGAR FREE 64)  30 mL Per Tube Daily  . fentaNYL  150 mcg Transdermal Q72H  . fluvoxaMINE  25 mg Per Tube QHS  . free water  250 mL Per Tube Q6H  . insulin aspart   0-20 Units Subcutaneous Q4H  . insulin glargine  85 Units Subcutaneous BID  . ipratropium-albuterol  3 mL Nebulization Q6H  . mouth rinse  15 mL Mouth Rinse 10 times per day  . metoprolol tartrate  50 mg Per Tube BID  . sodium chloride flush  10-40 mL Intracatheter Q12H   Infusions:  . feeding supplement (NEPRO CARB STEADY) 65 mL/hr at 05/10/18 0600  . heparin 1,900 Units/hr (05/10/18 0600)    Assessment: Pharmacy consulted for warfarin and heparin drip management for 47 yo male admitted to the ICU s/p cardiac arrest. Patient has history significant for aortic valve replacement, atrial fibrillation and CHF. Head CT is negative for infarction or hemorrhage.   Patient takes warfarin 5mg  daily as an outpatient for goal INR 2.5-3.5.    Goal of Therapy:  INR 2-3 Heparin level 0.3-0.7 units/ml Monitor platelets by anticoagulation protocol: Yes   Plan:  07/03 @ 0530 HL 0.40 therapeutic. Will continue at 1900 units/hr and will recheck w/ am labs.  Thomasene Rippleavid Elpidio Thielen, PharmD, BCPS Clinical Pharmacist 05/10/2018

## 2018-05-10 NOTE — Evaluation (Addendum)
[facility] Speaking Valve - Evaluation Patient Details  Name: Alexander Alexander MRN: [id number] Date of Birth: 07-03-1971  Today's Date: 05/10/2018 Time: 1215-1315 SLP Time Calculation (min) (ACUTE ONLY): 60 min  Past Medical History:  Past Medical History:  Diagnosis Date  . Allergy   . Anxiety   . Arrhythmia   . Arthritis   . Atrial fibrillation (HCC)   . CHF (congestive heart failure) (HCC)   . CKD (chronic kidney disease)   . Diabetes mellitus without complication (HCC)   . Diabetes mellitus, type II (HCC)   . Heart failure (HCC)   . Hyperlipidemia   . Hypertension   . Pancreatitis   . Sleep apnea    Past Surgical History:  Past Surgical History:  Procedure Laterality Date  . ABLATION  08/01/2017  . IR GASTROSTOMY TUBE MOD SED  05/04/2018  . MECHANICAL AORTIC VALVE REPLACEMENT    . PEG PLACEMENT Left 04/28/2018   Procedure: PERCUTANEOUS ENDOSCOPIC GASTROSTOMY (PEG) PLACEMENT;  Surgeon: Pasty Spillers, MD;  Location: [facility] ENDOSCOPY;  Service: Endoscopy;  Laterality: Left;  . TRACHEOSTOMY TUBE PLACEMENT N/A 04/25/2018   Procedure: TRACHEOSTOMY;  Surgeon: Vernie Murders, MD;  Location: [facility] ORS;  Service: ENT;  Laterality: N/A;   HPI:  Pt is a 47 y.o. male with PMH of atrial fibrillation, s/p ablation and aortic valve replacement on long term warfarin, CHF, CKD, diabetes, obstructive sleep apnea, obesity and hypertension. Pt. had a pulseless VT cardiac arrest in MRI when he was being evaluated for back pain and sciatica, subsequently intubated in the intensive care unit. Intubated from 5/31-6/18 when tracheostomy was placed. pt is not on Trach Collar w/ O2 support. Of note, pt also with L LE DVT currently on heparin drip diagnosed on 6/21. PEG tube has been considered by Surgery but not placed. Pt remains drowsy requiring Mod. cues during tx sessions w/ OT/PT per chart notes.    Assessment / Plan / Recommendation Clinical Impression  Pt was seen today for assessment of  toleration of PMV placement for verbal communication w/ others. Pt initially had his trach cuff inflated; RT consulted and deflated trach cuff this morning. Pt has been able to tolerate cuff deflated for ~3 hours w/ adequate O2 sats and vital signs per NSG. Pt has a #7 Shiley trach in place and in on trach collar for O2 support. Initially, O2 support was 40% (this was increased to 60% d/t pt's desaturations this PM - pt does have a baseline of sleep apnea). During this evaluation, O2 support was at 40%. Pt had min Cognitive difficulty w/ following through w/ tasks during finger occlusion so PMV was placed and pt instructed to talk w/ SLP and NSG. During the 3 attempts, pt exhibited O2 desaturations into the low 80's. PMV was removed and o2 sats allowed to recover. On the last attempt, PMV was placed for ~5 mins w/ adequate O2 sats in the low 90's. Pt verbalized intermittently w/ adequate volume and vocal quality, however, this was inconsistent - appeared mostly d/t his declined Cognition and Drowsiness. Mod+ verbal/tactile cues given to continue to engage in [facility] tasks w/ SLP. PMV then removed and pt allowed to rest. NSG updated on eval results and recommendations. Vital signs appeared grossly at his baseline. Pt's status/presentation currently is NOT appropriate for independent use of PMV at this time. Recommend continued ST services to provide PMV tx w/ pt to improve his ability to use PMV for verbal communication in ADLs w/ others. Unsure if pt's presentation  could be related to any Cognitive impact from potential Anoxia(event) that occurred during the cardiac arrest.  Of note, PMV was being attempted in hopes for pt to wear during future evaluation of swallowing for oral intake. Suspect pt will need objective assessment of swallowing d/t the extended illness, acuity of illness.    SLP Visit Diagnosis: Aphonia (R49.1)(tracheostomy)    SLP Assessment  Patient needs continued Speech Lanaguage Pathology  Services    Follow Up Recommendations  Skilled Nursing facility;LTACH(TBD)    Frequency and Duration min 3x week  2 weeks    PMSV Trial PMSV was placed for: up to ~5 mins x3 attempts  Able to redirect subglottic air through upper airway: Yes(w/ airflow noted at the level of the trach/stoma) Able to Attain Phonation: Yes(when he attempted fully; some whisper, gravely quality ) Voice Quality: Normal;Low vocal intensity(gravely also at times) Able to Expectorate Secretions: No attempts Level of Secretion Expectoration with PMSV: Not observed Breath Support for Phonation: Mildly decreased(possibly d/t Cognitive status/decline w/ tasks) Intelligibility: Intelligibility reduced Word: 75-100% accurate Respirations During Trial: 20 SpO2 During Trial: 93 %(w/ some desaturations) Pulse During Trial: 102 Behavior: Lethargic(few responses to communicate w/ others; cued)   Tracheostomy Tube  Additional Tracheostomy Tube Assessment Fenestrated: No Trach Collar Period: (pt is on continuous trach collar at this time per RT) Secretion Description: (thick; min at trach) Frequency of Tracheal Suctioning: (PRN, inner cannula changing ongoing by RT/NSG ) Level of Secretion Expectoration: Tracheal    Vent Dependency  Vent Dependent: No FiO2 (%): 60 %    Cuff Deflation Trial  GO Tolerated Cuff Deflation: Yes Length of Time for Cuff Deflation Trial: 3 hours Behavior: Poor eye contact(Drowsy) Cuff Deflation Trial - Comments: pt's trach cuff was deflated per request by SLP this morning. RT deflated and pt has tolerated this well w/ O2 sats in mid 90's for ~3 hours per NSG report        Michelangelo Rindfleisch 05/10/2018, 5:15 PM Jerilynn SomKatherine Janssen Zee, MS, CCC-SLP

## 2018-05-10 NOTE — Progress Notes (Signed)
   05/10/18 1055  Clinical Encounter Type  Visited With Patient  Visit Type Follow-up  Spiritual Encounters  Spiritual Needs Prayer   Chaplain offered silent and energetic prayers for patient and care team.

## 2018-05-10 NOTE — Progress Notes (Signed)
Occupational Therapy Treatment Patient Details Name: Alexander RiffleOtis Leon Lebeau MRN: [id number] DOB: 1970/11/24 Today's Date: 05/10/2018    History of present illness Pt. is a 47 y.o. male with PMH of atrial fibrillation, status post ablation and aortic valve replacement on long term warfarin, CHF, CKD, diabetes, obstructive sleep apnea and hypertension. Pt. had a pulseless VT cardiac arrest in MRI when he was being evaluated for back pain and sciatica, subsequently intubated in the intensive care unit. Intubated from 5/31-6/18 when trach placed. Pt for PEG placement tomorrow potentially. Of note, pt also with L LE DVT currently on heparin drip diagnosed on 6/21. PEG tube attempted yesterday, however unsuccessful. VItal go tilt bed obtained this date.   OT comments  Pt seen for brief OT session focused on BUE there-ex. Brought theraband to trial with pt but pt unable to attend without max verbal cues. RN in and reports pt has been very fatigued and lethargic since sponge bath this am. BUE supine ther-ex performed including composite finger flexion/extension, elbow flexion/extension, x5 each with max verbal cues to attend. Difficulty encouraging further ther-ex. Attempted to assist pt in washing his face with cool washcloth to improve alertness for session, pt became mildly agitated turning head away and bring arms up to cover his face. SLP in to trial PMV at end of session. Will continue efforts next session.    Follow Up Recommendations  LTACH    Equipment Recommendations  Tub/shower seat;Tub/shower bench;3 in 1 bedside commode    Recommendations for Other Services      Precautions / Restrictions Precautions Precautions: Fall Restrictions Weight Bearing Restrictions: No       Mobility Bed Mobility                  Transfers                      Balance                                           ADL either performed or assessed with clinical judgement    ADL   Eating/Feeding: NPO Eating/Feeding Details (indicate cue type and reason): will trial built up handles for utensils in future session Grooming: Moderate assistance;Bed level Grooming Details (indicate cue type and reason): attempted to wash face with cool washcloth to improve alertness for session, pt became mildly agitated turning head away and bring arms up to cover his face                                     Vision Patient Visual Report: No change from baseline     Perception     Praxis      Cognition Arousal/Alertness: Lethargic Behavior During Therapy: Flat affect Overall Cognitive Status: Within Functional Limits for tasks assessed                                 General Comments: pt fatigued per RN after sponge bath this am        Exercises Other Exercises Other Exercises: supine ther-ex performed on BUE including composite finger flexion/extension, elbow flexion/extension, x5 each with max verbal cues to attend. Pt declining additional ther-ex.   Shoulder Instructions  General Comments      Pertinent Vitals/ Pain       Pain Assessment: Faces Faces Pain Scale: Hurts a little bit Pain Descriptors / Indicators: Grimacing;Discomfort Pain Intervention(s): Limited activity within patient's tolerance;Monitored during session  Home Living                                          Prior Functioning/Environment              Frequency  Min 2X/week        Progress Toward Goals  OT Goals(current goals can now be found in the care plan section)  Progress towards OT goals: OT to reassess next treatment     Plan Discharge plan remains appropriate;Frequency remains appropriate    Co-evaluation                 AM-PAC PT "6 Clicks" Daily Activity     Outcome Measure   Help from another person eating meals?: Total Help from another person taking care of personal grooming?: A Lot Help from  another person toileting, which includes using toliet, bedpan, or urinal?: Total Help from another person bathing (including washing, rinsing, drying)?: Total Help from another person to put on and taking off regular upper body clothing?: Total Help from another person to put on and taking off regular lower body clothing?: Total 6 Click Score: 7    End of Session    OT Visit Diagnosis: Muscle weakness (generalized) (M62.81)   Activity Tolerance Patient limited by fatigue;Patient limited by lethargy   Patient Left in bed;with call bell/phone within reach;with bed alarm set   Nurse Communication          Time: 1610-9604 OT Time Calculation (min): 8 min  Charges: OT General Charges $OT Visit: 1 Visit OT Treatments $Therapeutic Exercise: 8-22 mins   Richrd Prime, MPH, MS, OTR/L ascom [phone] 05/10/18, 11:32 AM

## 2018-05-10 NOTE — Progress Notes (Signed)
Physical Therapy Treatment Patient Details Name: Alexander Alexander MRN: [id number] DOB: 02/08/1971 Today's Date: 05/10/2018    History of Present Illness Pt. is a 47 y.o. male with PMH of atrial fibrillation, status post ablation and aortic valve replacement on long term warfarin, CHF, CKD, diabetes, obstructive sleep apnea and hypertension. Pt. had a pulseless VT cardiac arrest in MRI when he was being evaluated for back pain and sciatica, subsequently intubated in the intensive care unit. Intubated from 5/31-6/18 when trach placed. Pt for PEG placement tomorrow potentially. Of note, pt also with L LE DVT currently on heparin drip diagnosed on 6/21. PEG tube attempted yesterday, however unsuccessful. VItal go tilt bed obtained this date.    PT Comments    Pt seen this date and is up in chair upon arrival. RN able to transfer pt from bed to chair with total assistance +3. Pt appears very fatigued during this session and is limited secondary to fatigue. Pt instructed in there-ex however fatigues easily during. Pt total assist +3 transferred back into bed at conclusion of session. Pt demonstrates decreased ability to rotate head to L. Pt could benefit from continued skilled therapy at this time to improve deficits toward PLOF. PT will continue to work with pt at least 2x/week while admitted. D/c recommendations continue to be SNF to [facility].    Follow Up Recommendations  SNF;LTACH     Equipment Recommendations       Recommendations for Other Services       Precautions / Restrictions Precautions Precautions: Fall Restrictions Weight Bearing Restrictions: No    Mobility  Bed Mobility Overal bed mobility: Needs Assistance Bed Mobility: Rolling Rolling: Max assist         General bed mobility comments: pt max assist +2 to roll to each side to remove lift sling from under him once back in bed.  Transfers Overall transfer level: Needs assistance               General transfer  comment: pt total assist using ceiling lift from chair back into bed.  Ambulation/Gait                 Stairs             Wheelchair Mobility    Modified Rankin (Stroke Patients Only)       Balance                                            Cognition Arousal/Alertness: Lethargic Behavior During Therapy: Flat affect Overall Cognitive Status: Within Functional Limits for tasks assessed                                 General Comments: pt very fatigued during session. RN states pt been fatigued since sponge bath and OT session      Exercises Other Exercises Other Exercises: pt instructed in ther-ex including x10 B LAQ, shoulder flexion, seated heel raises, elbow flexion, marching. Pt required variable levels of assistance from supervision to min assist.    General Comments        Pertinent Vitals/Pain Pain Assessment: Faces Faces Pain Scale: Hurts a little bit Pain Descriptors / Indicators: Grimacing;Discomfort Pain Intervention(s): Limited activity within patient's tolerance;Monitored during session;Repositioned    Home Living  Prior Function            PT Goals (current goals can now be found in the care plan section) Acute Rehab PT Goals Patient Stated Goal: pt wants to stand PT Goal Formulation: Patient unable to participate in goal setting Time For Goal Achievement: 05/17/18 Potential to Achieve Goals: Fair Progress towards PT goals: Progressing toward goals    Frequency    Min 2X/week      PT Plan Current plan remains appropriate    Co-evaluation              AM-PAC PT "6 Clicks" Daily Activity  Outcome Measure  Difficulty turning over in bed (including adjusting bedclothes, sheets and blankets)?: Unable Difficulty moving from lying on back to sitting on the side of the bed? : Unable Difficulty sitting down on and standing up from a chair with arms (e.g.,  wheelchair, bedside commode, etc,.)?: Unable Help needed moving to and from a bed to chair (including a wheelchair)?: Total Help needed walking in hospital room?: Total Help needed climbing 3-5 steps with a railing? : Total 6 Click Score: 6    End of Session   Activity Tolerance: Patient limited by fatigue Patient left: in bed;with nursing/sitter in room Nurse Communication: Mobility status PT Visit Diagnosis: Muscle weakness (generalized) (M62.81);Difficulty in walking, not elsewhere classified (R26.2);Other symptoms and signs involving the nervous system (R29.898)     Time: 1440-1505 PT Time Calculation (min) (ACUTE ONLY): 25 min  Charges:                       G Codes:       Sanuel Ladnier Elnora Morrisonhaffin, SPT    Davier Tramell 05/10/2018, 3:51 PM

## 2018-05-10 NOTE — Progress Notes (Signed)
PT transferred from chair to bed with ceiling lift by this RN and two PT staff members. Pt tolerated move well. Prior to move, new lift pad placed on bed and once pt back in bed, old lift pad removed from under patient. Once back in bed, pt fell to sleep and sleeping deeply, desated to 86%, respiratory called and FiO2 turned up to 60%.

## 2018-05-10 NOTE — Progress Notes (Addendum)
Follow up - Critical Care Medicine Note  Patient Details:    Alexander Alexander is an 47 y.o. male.with atrial fibrillation, status post ablation and aortic valve replacement on long term warfarin, CHF, CKD, diabetes, obstructive sleep apnea and hypertension, suffered a pulseless VT cardiac arrest in MRI when he was being evaluated for back pain and sciatica, subsequently intubated in the intensive care unit. Difficult airway noted   Lines, Airways, Drains: PICC Triple Lumen 04/17/18 PICC Right Cephalic 39 cm 0 cm (Active)  Indication for Insertion or Continuance of Line Prolonged intravenous therapies 05/09/2018  8:44 PM  Exposed Catheter (cm) 0 cm 05/01/2018  5:37 PM  Site Assessment Clean;Dry;Intact 05/09/2018  8:44 PM  Lumen #1 Status Infusing;Flushed;Blood return noted 05/09/2018  8:44 PM  Lumen #2 Status Flushed;Saline locked;Blood return noted 05/09/2018  8:44 PM  Lumen #3 Status Flushed;Saline locked;Blood return noted 05/09/2018  8:44 PM  Dressing Type Transparent;Occlusive 05/09/2018  8:44 PM  Dressing Status Clean;Dry;Intact;Antimicrobial disc in place 05/09/2018  8:44 PM  Line Care Connections checked and tightened 05/09/2018  8:44 PM  Dressing Intervention New dressing;Dressing changed;Antimicrobial disc changed 05/08/2018  6:00 PM  Dressing Change Due 05/15/18 05/09/2018  8:44 PM     NG/OG Tube Nasogastric Right nare Xray Documented cm marking at nare/ corner of mouth 65 cm (Active)  Cm Marking at Nare/Corner of Mouth (if applicable) 65 cm 05/09/2018  2:30 PM  Site Assessment Clean;Dry;Intact 05/10/2018  4:30 AM  Ongoing Placement Verification No acute changes, not attributed to clinical condition;No change in respiratory status;No change in cm markings or external length of tube from initial placement 05/09/2018  2:30 PM  Status Infusing tube feed 05/10/2018  4:30 AM  Intake (mL) 30 mL 05/05/2018  5:00 PM  Output (mL) 850 mL 05/07/2018  4:00 AM     Urethral Catheter C Green, Rn Double-lumen 14 Fr.  (Active)  Indication for Insertion or Continuance of Catheter Acute urinary retention 05/10/2018  4:30 AM  Site Assessment Clean;Intact 05/10/2018  4:30 AM  Catheter Maintenance Bag below level of bladder;Catheter secured;Drainage bag/tubing not touching floor;Insertion date on drainage bag;No dependent loops 05/10/2018  4:30 AM  Collection Container Standard drainage bag 05/09/2018  2:30 PM  Securement Method Securing device (Describe) 05/10/2018  4:30 AM  Urinary Catheter Interventions Unclamped 05/09/2018  2:30 PM  Output (mL) 300 mL 05/09/2018 11:00 AM    Anti-infectives:  Anti-infectives (From admission, onward)   Start     Dose/Rate Route Frequency Ordered Stop   04/28/18 1100  ceFAZolin (ANCEF) 3 g in dextrose 5 % 50 mL IVPB     3 g 100 mL/hr over 30 Minutes Intravenous  Once 04/28/18 0058 04/28/18 1058   04/19/18 1000  linezolid (ZYVOX) IVPB 600 mg  Status:  Discontinued     600 mg 300 mL/hr over 60 Minutes Intravenous Every 12 hours 04/19/18 0826 04/28/18 1011   04/18/18 1330  ceFEPIme (MAXIPIME) 2 g in sodium chloride 0.9 % 100 mL IVPB  Status:  Discontinued     2 g 200 mL/hr over 30 Minutes Intravenous Every 12 hours 04/18/18 1322 04/21/18 1108   04/18/18 0800  vancomycin (VANCOCIN) 1,250 mg in sodium chloride 0.9 % 250 mL IVPB  Status:  Discontinued     1,250 mg 166.7 mL/hr over 90 Minutes Intravenous Every 24 hours 04/18/18 0621 04/19/18 0826   04/17/18 0630  vancomycin (VANCOCIN) 1,250 mg in sodium chloride 0.9 % 250 mL IVPB     1,250 mg 166.7 mL/hr over  90 Minutes Intravenous  Once 04/17/18 0618 04/17/18 0855   04/14/18 1430  vancomycin (VANCOCIN) IVPB 1000 mg/200 mL premix  Status:  Discontinued     1,000 mg 200 mL/hr over 60 Minutes Intravenous Every 12 hours 04/14/18 1348 04/16/18 1450   04/11/18 2000  vancomycin (VANCOCIN) 1,250 mg in sodium chloride 0.9 % 250 mL IVPB  Status:  Discontinued     1,250 mg 166.7 mL/hr over 90 Minutes Intravenous Every 18 hours 04/11/18 1548  04/14/18 1348   04/10/18 1400  vancomycin (VANCOCIN) 1,250 mg in sodium chloride 0.9 % 250 mL IVPB  Status:  Discontinued     1,250 mg 166.7 mL/hr over 90 Minutes Intravenous Every 12 hours 04/10/18 1149 04/11/18 1548   04/10/18 1200  Ampicillin-Sulbactam (UNASYN) 3 g in sodium chloride 0.9 % 100 mL IVPB  Status:  Discontinued     3 g 200 mL/hr over 30 Minutes Intravenous Every 6 hours 04/10/18 0851 04/10/18 1131   04/09/18 1800  vancomycin (VANCOCIN) 1,500 mg in sodium chloride 0.9 % 500 mL IVPB  Status:  Discontinued     1,500 mg 250 mL/hr over 120 Minutes Intravenous Every 18 hours 04/09/18 1303 04/10/18 0750   04/09/18 1045  vancomycin (VANCOCIN) IVPB 1000 mg/200 mL premix     1,000 mg 200 mL/hr over 60 Minutes Intravenous  Once 04/09/18 1041 04/09/18 1307   04/07/18 1400  piperacillin-tazobactam (ZOSYN) IVPB 3.375 g  Status:  Discontinued     3.375 g 12.5 mL/hr over 240 Minutes Intravenous Every 8 hours 04/07/18 1015 04/10/18 0851      Microbiology: Results for orders placed or performed during the hospital encounter of 04/07/18  MRSA PCR Screening     Status: None   Collection Time: 04/07/18  9:49 AM  Result Value Ref Range Status   MRSA by PCR NEGATIVE NEGATIVE Final    Comment:        The GeneXpert MRSA Assay (FDA approved for NASAL specimens only), is one component of a comprehensive MRSA colonization surveillance program. It is not intended to diagnose MRSA infection nor to guide or monitor treatment for MRSA infections. Performed at [facility], [address] [place], [place] [address]   Culture, respiratory (NON-Expectorated)     Status: None   Collection Time: 04/08/18 10:53 AM  Result Value Ref Range Status   Specimen Description   Final    TRACHEAL ASPIRATE Performed at [facility], [address] [place], [place] [address]    Special Requests   Final    Normal Performed at [facility], [address] [place],  [place] [address]    Gram Stain   Final    ABUNDANT WBC PRESENT, PREDOMINANTLY PMN NO SQUAMOUS EPITHELIAL CELLS SEEN MODERATE GRAM POSITIVE COCCI IN CLUSTERS FEW GRAM NEGATIVE COCCOBACILLI RARE GRAM POSITIVE RODS Performed at [facility], [address], [place], [place] [address]    Culture   Final    ABUNDANT METHICILLIN RESISTANT STAPHYLOCOCCUS AUREUS   Report Status 04/10/2018 FINAL  Final   Organism ID, Bacteria METHICILLIN RESISTANT STAPHYLOCOCCUS AUREUS  Final      Susceptibility   Methicillin resistant staphylococcus aureus - MIC*    CIPROFLOXACIN >=8 RESISTANT Resistant     ERYTHROMYCIN >=8 RESISTANT Resistant     GENTAMICIN <=0.5 SENSITIVE Sensitive     OXACILLIN >=4 RESISTANT Resistant     TETRACYCLINE <=1 SENSITIVE Sensitive     VANCOMYCIN 1 SENSITIVE Sensitive     TRIMETH/SULFA <=10  SENSITIVE Sensitive     CLINDAMYCIN <=0.25 SENSITIVE Sensitive     RIFAMPIN <=0.5 SENSITIVE Sensitive     Inducible Clindamycin NEGATIVE Sensitive     * ABUNDANT METHICILLIN RESISTANT STAPHYLOCOCCUS AUREUS  CULTURE, BLOOD (ROUTINE X 2) w Reflex to ID Panel     Status: None   Collection Time: 04/08/18 11:43 AM  Result Value Ref Range Status   Specimen Description BLOOD LAC  Final   Special Requests   Final    BOTTLES DRAWN AEROBIC AND ANAEROBIC Blood Culture adequate volume   Culture   Final    NO GROWTH 5 DAYS Performed at [facility], [address] [place], [place] [address]    Report Status 04/13/2018 FINAL  Final  CULTURE, BLOOD (ROUTINE X 2) w Reflex to ID Panel     Status: None   Collection Time: 04/08/18 11:43 AM  Result Value Ref Range Status   Specimen Description BLOOD LT HAND  Final   Special Requests   Final    BOTTLES DRAWN AEROBIC AND ANAEROBIC Blood Culture adequate volume   Culture   Final    NO GROWTH 5 DAYS Performed at [facility], [address], [place], [place] [address]    Report Status 04/13/2018 FINAL  Final  Urine Culture      Status: None   Collection Time: 04/08/18  1:16 PM  Result Value Ref Range Status   Specimen Description   Final    URINE, CATHETERIZED Performed at [facility], [address] [place], [place] [address]    Special Requests   Final    Normal Performed at [facility], [address] [place], [place] [address]    Culture   Final    NO GROWTH Performed at [facility] [place] [facility], [address], [place], [place] [address]    Report Status 04/09/2018 FINAL  Final  Urine Culture     Status: None   Collection Time: 04/12/18  6:55 AM  Result Value Ref Range Status   Specimen Description   Final    URINE, RANDOM Performed at [facility], [address], [place], [place] [address]    Special Requests   Final    NONE Performed at [facility], [address], [place], [place] [address]    Culture   Final    NO GROWTH Performed at [facility], [address] [place], [place] [address]    Report Status 04/13/2018 FINAL  Final  CULTURE, BLOOD (ROUTINE X 2) w Reflex to ID Panel     Status: None   Collection Time: 04/12/18  7:30 AM  Result Value Ref Range Status   Specimen Description BLOOD BLOOD LEFT HAND  Final   Special Requests   Final    BOTTLES DRAWN AEROBIC AND ANAEROBIC Blood Culture adequate volume   Culture   Final    NO GROWTH 5 DAYS Performed at [facility], [address] [place], [place] [address]    Report Status 04/17/2018 FINAL  Final  CULTURE, BLOOD (ROUTINE X 2) w Reflex to ID Panel     Status: None   Collection Time: 04/12/18  7:46 AM  Result Value Ref Range Status   Specimen Description BLOOD BLOOD LEFT HAND  Final   Special Requests   Final    BOTTLES DRAWN AEROBIC AND ANAEROBIC Blood Culture adequate volume   Culture   Final    NO GROWTH 5 DAYS Performed at [facility], [address], [place], [place] [address]    Report Status 04/17/2018 FINAL  Final  Culture, respiratory  (NON-Expectorated)     Status: None   Collection Time: 04/12/18  8:11 AM  Result Value Ref Range Status   Specimen Description   Final    TRACHEAL ASPIRATE Performed at [facility], [address] [place], [place] [address]    Special Requests   Final    NONE Performed at [facility], [address] [place], [place] [address]    Gram Stain   Final    FEW WBC PRESENT, PREDOMINANTLY PMN FEW GRAM POSITIVE COCCI Performed at [facility], [address] [place], [place] [address]    Culture FEW METHICILLIN RESISTANT STAPHYLOCOCCUS AUREUS  Final   Report Status 04/15/2018 FINAL  Final   Organism ID, Bacteria METHICILLIN RESISTANT STAPHYLOCOCCUS AUREUS  Final      Susceptibility   Methicillin resistant staphylococcus aureus - MIC*    CIPROFLOXACIN >=8 RESISTANT Resistant     ERYTHROMYCIN >=8 RESISTANT Resistant     GENTAMICIN <=0.5 SENSITIVE Sensitive     OXACILLIN >=4 RESISTANT Resistant     TETRACYCLINE <=1 SENSITIVE Sensitive     VANCOMYCIN 1 SENSITIVE Sensitive     TRIMETH/SULFA <=10 SENSITIVE Sensitive     CLINDAMYCIN <=0.25 SENSITIVE Sensitive     RIFAMPIN <=0.5 SENSITIVE Sensitive     Inducible Clindamycin NEGATIVE Sensitive     * FEW METHICILLIN RESISTANT STAPHYLOCOCCUS AUREUS  C difficile quick scan w PCR reflex     Status: None   Collection Time: 04/12/18  3:05 PM  Result Value Ref Range Status   C Diff antigen NEGATIVE NEGATIVE Final   C Diff toxin NEGATIVE NEGATIVE Final   C Diff interpretation No C. difficile detected.  Final    Comment: Performed at [facility], [address] [place], [place] [address]  Culture, respiratory (NON-Expectorated)     Status: None   Collection Time: 04/18/18  9:37 AM  Result Value Ref Range Status   Specimen Description   Final    TRACHEAL ASPIRATE Performed at [facility], [address], [place], [place] [address]    Special Requests   Final    NONE Performed at [facility], [address] [place], [place] [address]    Gram Stain   Final    MODERATE WBC PRESENT, PREDOMINANTLY PMN FEW GRAM POSITIVE COCCI IN PAIRS IN CLUSTERS Performed at [facility], [address], [place], [place] [address]    Culture   Final    MODERATE METHICILLIN RESISTANT STAPHYLOCOCCUS AUREUS   Report Status 04/21/2018 FINAL  Final   Organism ID, Bacteria METHICILLIN RESISTANT STAPHYLOCOCCUS AUREUS  Final      Susceptibility   Methicillin resistant staphylococcus aureus - MIC*    CIPROFLOXACIN >=8 RESISTANT Resistant     ERYTHROMYCIN >=8 RESISTANT Resistant     GENTAMICIN <=0.5 SENSITIVE Sensitive     OXACILLIN >=4 RESISTANT Resistant     TETRACYCLINE <=1 SENSITIVE Sensitive     VANCOMYCIN 1 SENSITIVE Sensitive     TRIMETH/SULFA <=10 SENSITIVE Sensitive     CLINDAMYCIN >=8 RESISTANT Resistant     RIFAMPIN <=0.5 SENSITIVE Sensitive     Inducible Clindamycin NEGATIVE Sensitive     * MODERATE METHICILLIN RESISTANT STAPHYLOCOCCUS AUREUS  C difficile quick scan w PCR reflex     Status: None   Collection Time: 04/19/18 11:21 AM  Result Value Ref  Range Status   C Diff antigen NEGATIVE NEGATIVE Final   C Diff toxin NEGATIVE NEGATIVE Final   C Diff interpretation No C. difficile detected.  Final    Comment: Performed at [facility], [address] [place] [address] [place], [place] [address]  C difficile quick scan w PCR reflex     Status: None   Collection Time: 04/24/18  5:30 AM  Result Value Ref Range Status   C Diff antigen NEGATIVE NEGATIVE Final   C Diff toxin NEGATIVE NEGATIVE Final   C Diff interpretation No C. difficile detected.  Final    Comment: Performed at [facility], [address] [place] [address] [place], [place] [address]  CULTURE, BLOOD (ROUTINE X 2) w Reflex to ID Panel     Status: None   Collection Time: 04/27/18 10:49 AM  Result Value Ref Range Status   Specimen Description BLOOD BLOOD LEFT HAND  Final   Special Requests   Final    BOTTLES DRAWN  AEROBIC AND ANAEROBIC Blood Culture adequate volume   Culture   Final    NO GROWTH 5 DAYS Performed at [facility], [address] [place], [place] [address]    Report Status 05/02/2018 FINAL  Final  CULTURE, BLOOD (ROUTINE X 2) w Reflex to ID Panel     Status: None   Collection Time: 04/27/18 11:58 AM  Result Value Ref Range Status   Specimen Description BLOOD BLOOD LEFT HAND  Final   Special Requests   Final    BOTTLES DRAWN AEROBIC AND ANAEROBIC Blood Culture adequate volume   Culture   Final    NO GROWTH 5 DAYS Performed at [facility], [address], [place], [place] [address]    Report Status 05/02/2018 FINAL  Final  Culture, respiratory (NON-Expectorated)     Status: None   Collection Time: 04/27/18 12:00 PM  Result Value Ref Range Status   Specimen Description   Final    TRACHEAL ASPIRATE Performed at [facility], [address], [place], [place] [address]    Special Requests   Final    NONE Performed at [facility], [address] [place], [place] [address]    Gram Stain   Final    ABUNDANT WBC PRESENT, PREDOMINANTLY PMN NO ORGANISMS SEEN Performed at [facility], [address], [place], [place] [address]    Culture FEW METHICILLIN RESISTANT STAPHYLOCOCCUS AUREUS  Final   Report Status 04/30/2018 FINAL  Final   Organism ID, Bacteria METHICILLIN RESISTANT STAPHYLOCOCCUS AUREUS  Final      Susceptibility   Methicillin resistant staphylococcus aureus - MIC*    CIPROFLOXACIN >=8 RESISTANT Resistant     ERYTHROMYCIN >=8 RESISTANT Resistant     GENTAMICIN <=0.5 SENSITIVE Sensitive     OXACILLIN >=4 RESISTANT Resistant     TETRACYCLINE <=1 SENSITIVE Sensitive     VANCOMYCIN 1 SENSITIVE Sensitive     TRIMETH/SULFA <=10 SENSITIVE Sensitive     CLINDAMYCIN <=0.25 SENSITIVE Sensitive     RIFAMPIN <=0.5 SENSITIVE Sensitive     Inducible Clindamycin NEGATIVE Sensitive     * FEW METHICILLIN RESISTANT STAPHYLOCOCCUS  AUREUS  Urine Culture     Status: None   Collection Time: 04/27/18  5:27 PM  Result Value Ref Range Status   Specimen Description   Final    URINE, CATHETERIZED Performed at [facility], [address], [place], [place] [address]    Special Requests   Final    NONE Performed  at [facility], [address], [place], [place] [address]    Culture   Final    NO GROWTH Performed at [facility], [address], [place], [place] [address]    Report Status 04/29/2018 FINAL  Final    Studies: Ct Abdomen Wo Contrast  Result Date: 05/01/2018 CLINICAL DATA:  47 y/o  M; evaluate for PEG tube placement. EXAM: CT ABDOMEN WITHOUT CONTRAST TECHNIQUE: Multidetector CT imaging of the abdomen was performed following the standard protocol without IV contrast. COMPARISON:  None. FINDINGS: Lower chest: Cardiomegaly, platelike atelectasis in the lung bases, mitral annular calcification. Hepatobiliary: No focal liver abnormality is seen. No gallstones, gallbladder wall thickening, or biliary dilatation. Pancreas: Unremarkable. No pancreatic ductal dilatation or surrounding inflammatory changes. Spleen: Normal in size without focal abnormality. Adrenals/Urinary Tract: Adrenal glands are unremarkable. Kidneys are normal, without renal calculi, focal lesion, or hydronephrosis. Stomach/Bowel: Stomach is within normal limits. Appendix appears normal. No evidence of bowel wall thickening, distention, or inflammatory changes. The left lobe of the liver extends inferiorly overlapping the majority of the gastric body. There is no colon super position over the stomach at this time. There is a very small area of uncovered stomach in the left anterior axillary line below the ribcage (series 2, image 32). Vascular/Lymphatic: No significant vascular findings are present. No enlarged abdominal or pelvic lymph nodes. Other: No abdominal wall hernia or abnormality. Musculoskeletal: No acute or significant  osseous findings. IMPRESSION: The left lobe of the liver extends inferiorly overlapping the majority of the gastric body. There is no colon superficial to the stomach at this time. There is a very small area of uncovered stomach in the left anterior axillary line below the ribcage. Electronically Signed   By: Mitzi Hansen M.D.   On: 05/01/2018 23:14   Dg Abd 1 View  Result Date: 05/04/2018 CLINICAL DATA:  Preop for gastrostomy tube placement EXAM: ABDOMEN - 1 VIEW COMPARISON:  Abdominal CT from 3 days ago FINDINGS: Nasogastric tube tip over the stomach. Normal bowel gas pattern. Low volume chest with streaky opacities. IMPRESSION: Nasogastric tube with tip over the stomach. Limited abdominal coverage with no dilated bowel seen. Electronically Signed   By: Marnee Spring M.D.   On: 05/04/2018 12:27   Dg Abd 1 View  Result Date: 04/29/2018 CLINICAL DATA:  47 y/o  M; enteric tube placement. EXAM: ABDOMEN - 1 VIEW COMPARISON:  04/25/2018 abdomen radiograph. FINDINGS: Enteric tube tip projects over the gastric body. Post median sternotomy with broken lower wire. Left upper abdomen bowel gases travel. IMPRESSION: Enteric tube tip projects over gastric body. Electronically Signed   By: Mitzi Hansen M.D.   On: 04/29/2018 16:28   Dg Abd 1 View  Result Date: 04/25/2018 CLINICAL DATA:  Check gastric catheter placement EXAM: ABDOMEN - 1 VIEW COMPARISON:  04/18/2018 FINDINGS: Gastric catheter is noted extending into the stomach. Nonobstructive bowel gas pattern is seen. No other focal abnormality is noted. IMPRESSION: Nasogastric catheter within the stomach. Electronically Signed   By: Alcide Clever M.D.   On: 04/25/2018 09:13   Dg Abd 1 View  Result Date: 04/18/2018 CLINICAL DATA:  Orogastric tube placement EXAM: ABDOMEN - 1 VIEW COMPARISON:  None. FINDINGS: Orogastric tube tip is slightly below the gastroesophageal junction in the proximal gastric cardia. The side port is felt to be  above the diaphragm. There is a relative paucity of gas in the visualized abdominal region. No obstruction or free air evident. IMPRESSION: Orogastric tube tip  just below the gastroesophageal junction. Advise advancing orogastric tube approximately 8-10 cm to ensure that side-port as well as tube tip are in the stomach. Relative paucity of gas present. This finding may be seen normally but also may be indicative of early ileus or enteritis. Electronically Signed   By: Bretta BangWilliam  Woodruff III M.D.   On: 04/18/2018 11:40   Ct Chest Wo Contrast  Result Date: 04/18/2018 CLINICAL DATA:  Inpatient. Acute respiratory illness. Desaturation. Intubated. EXAM: CT CHEST WITHOUT CONTRAST TECHNIQUE: Multidetector CT imaging of the chest was performed following the standard protocol without IV contrast. COMPARISON:  Chest radiograph from earlier today. FINDINGS: Examination is significantly limited by patient body habitus and by streak artifact from the patient's upper extremities. Cardiovascular: Mild cardiomegaly. No significant pericardial effusion/thickening. Aortic valve prosthesis is in place. Coronary atherosclerosis. Atherosclerotic thoracic aorta with ectatic 4.3 cm ascending thoracic aorta. Dilated main pulmonary artery (3.9 cm diameter). Mediastinum/Nodes: No discrete thyroid nodules. Enteric tube terminates in the proximal stomach. No pathologically enlarged axillary, mediastinal or hilar lymph nodes, noting limited sensitivity for the detection of hilar adenopathy on this noncontrast study. Lungs/Pleura: No pneumothorax. No convincing pleural effusions. Endotracheal tube tip is 8.7 cm above the carina. Complete right lower lobe consolidation with scattered air bronchograms and some volume loss. Segmental right middle lobe and medial left lower lobe atelectasis. Patchy ground-glass opacities in the upper lobes bilaterally. Patchy nodular foci of consolidation in the right upper lobe measuring up to 9 mm (series  3/image 39). Upper abdomen: Diffuse hepatic steatosis. Musculoskeletal: No aggressive appearing focal osseous lesions. Moderate symmetric gynecomastia. Discontinuity in the lower most sternotomy wire. Marked thoracic spondylosis. IMPRESSION: 1. Limited scan. Complete right lower lobe consolidation with scattered air bronchograms and some volume loss. Patchy ground-glass opacities in the bilateral upper lobes. Patchy nodular foci of consolidation in the right upper lobe. Multilobar pneumonia is suspected, with superimposed atelectasis in the right greater than left lungs. 2.  Endotracheal tube tip is 8.7 cm above the carina. 3. Mild cardiomegaly. Dilated main pulmonary artery, suggesting pulmonary arterial hypertension. 4. Ectatic 4.3 cm ascending thoracic aorta. Recommend annual imaging followup by CTA or MRA. This recommendation follows 2010 ACCF/AHA/AATS/ACR/ASA/SCA/SCAI/SIR/STS/SVM Guidelines for the Diagnosis and Management of Patients with Thoracic Aortic Disease. Circulation. 2010; 121: [id number]. 5. Diffuse hepatic steatosis. 6. Moderate symmetric gynecomastia. Aortic Atherosclerosis (ICD10-I70.0). Electronically Signed   By: Delbert PhenixJason A Poff M.D.   On: 04/18/2018 10:56   Ir Gastrostomy Tube Mod Sed  Result Date: 05/04/2018 INDICATION: 47 year old with multiple medical problems including history of acute cardiopulmonary arrest. Patient has a tracheostomy tube and request for gastrostomy tube. GI was unable to safely place a gastrostomy tube with endoscopy. Patient had an abdominal CT that suggested there may be a safe percutaneous window for gastrostomy tube placement. Informed consent was obtained from the patient's wife. EXAM: FLUOROSCOPY TIME FOR GASTROSTOMY TUBE EVALUATION Physician: Rachelle HoraAdam R. Henn, MD MEDICATIONS: None ANESTHESIA/SEDATION: None FLUOROSCOPY TIME:  Fluoroscopy Time: 3 minutes and 48 seconds, 650 mGy COMPLICATIONS: None immediate. PROCEDURE: The procedure was explained to the patient. The  risks and benefits of the procedure were discussed and the patient' s white questions were addressed. Informed consent was obtained from the patient's wife. Patient was placed supine on the interventional table. The stomach was insufflated with gas through the nasogastric tube. The colon is gas-filled and well visualized. However, there appeared to be redundant loops of colon in the splenic flexure that were projecting over the stomach. Due to patient's morbid obesity, it  was difficult to get a true lateral view to assess the exact position of the colon. Based on the fluoroscopic evaluation, a safe percutaneous window could not be identified. Therefore, gastrostomy tube was not attempted. IMPRESSION: Percutaneous gastrostomy tube was not attempted because a safe percutaneous window was not identified. Consider surgical consultation. Electronically Signed   By: Richarda Overlie M.D.   On: 05/04/2018 15:40   US Venous Img Lower Bilateral  Result Date: 04/28/2018 CLINICAL DATA:  Fever, lower extremity edema EXAM: BILATERAL LOWER EXTREMITY VENOUS DOPPLER ULTRASOUND TECHNIQUE: Gray-scale sonography with graded compression, as well as color Doppler and duplex ultrasound were performed to evaluate the lower extremity deep venous systems from the level of the common femoral vein and including the common femoral, femoral, profunda femoral, popliteal and calf veins including the posterior tibial, peroneal and gastrocnemius veins when visible. The superficial great saphenous vein was also interrogated. Spectral Doppler was utilized to evaluate flow at rest and with distal augmentation maneuvers in the common femoral, femoral and popliteal veins. COMPARISON:  None. FINDINGS: Limited because of body habitus. RIGHT LOWER EXTREMITY Common Femoral Vein: No evidence of thrombus. Normal compressibility, respiratory phasicity and response to augmentation. Saphenofemoral Junction: No evidence of thrombus. Normal compressibility and flow  on color Doppler imaging. Profunda Femoral Vein: No evidence of thrombus. Normal compressibility and flow on color Doppler imaging. Femoral Vein: No evidence of thrombus. Normal compressibility, respiratory phasicity and response to augmentation. Popliteal Vein: No evidence of thrombus. Normal compressibility, respiratory phasicity and response to augmentation. Calf Veins: Limited visualization.  No gross occlusive thrombus. Superficial Great Saphenous Vein: No evidence of thrombus. Normal compressibility. Venous Reflux:  None. Other Findings:  None. LEFT LOWER EXTREMITY Common Femoral Vein: No evidence of thrombus. Normal compressibility, respiratory phasicity and response to augmentation. Saphenofemoral Junction: No evidence of thrombus. Normal compressibility and flow on color Doppler imaging. Profunda Femoral Vein: No evidence of thrombus. Normal compressibility and flow on color Doppler imaging. Femoral Vein: No evidence of thrombus. Normal compressibility, respiratory phasicity and response to augmentation. Popliteal Vein: No evidence of thrombus. Normal compressibility, respiratory phasicity and response to augmentation. Calf Veins: Limited exam. However, the left tibial veins appear patent. But the left peroneal veins are noncompressible with hypoechoic thrombus appearing occlusive. No propagation into the popliteal or femoral veins. Superficial Great Saphenous Vein: No evidence of thrombus. Normal compressibility. Venous Reflux:  None. Other Findings:  None. IMPRESSION: Limited exam because of body habitus. Positive exam for left calf peroneal DVT without propagation into the femoral or popliteal veins. Very low thrombus burden. No significant right lower extremity DVT. Electronically Signed   By: Judie Petit.  Shick M.D.   On: 04/28/2018 16:02   Dg Chest Port 1 View  Result Date: 05/06/2018 CLINICAL DATA:  Acute respiratory failure. EXAM: PORTABLE CHEST 1 VIEW COMPARISON:  04/28/2018. FINDINGS: The tracheostomy  tube remains in satisfactory position. Nasogastric tube in place, not clearly visible distally due to overlying density. Right PICC tip in the superior vena cava 4 cm above the superior cavoatrial junction. 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 No significant change in linear density in both lower lung zones and prominent pulmonary vasculature. Unremarkable bones. IMPRESSION: Stable cardiomegaly, pulmonary vascular congestion and bibasilar atelectasis. Electronically Signed   By: Beckie Salts M.D.   On: 05/06/2018 08:13   Dg Chest Port 1 View  Result Date: 04/28/2018 CLINICAL DATA:  Respiratory failure EXAM: PORTABLE CHEST 1 VIEW COMPARISON:  April 27, 2018 FINDINGS: Tracheostomy catheter tip is 5.1 cm  above the carina. Nasogastric tube tip and side port are below the diaphragm. No pneumothorax. There is bibasilar atelectatic change. Lungs elsewhere clear. Heart is mildly enlarged with pulmonary vascularity normal. Patient is status post median sternotomy. No evident bone lesions. No appreciable adenopathy. IMPRESSION: Tube positions as described without pneumothorax. Bibasilar atelectasis. Stable cardiac prominence. Electronically Signed   By: Bretta BangWilliam  Woodruff III M.D.   On: 04/28/2018 13:55   Dg Chest Port 1 View  Result Date: 04/27/2018 CLINICAL DATA:  Respiratory failure EXAM: PORTABLE CHEST 1 VIEW COMPARISON:  Two days ago FINDINGS: Tracheostomy tube in good position. An orogastric tube reaches the stomach. There is artifact from EKG leads. Right upper extremity PICC with tip at the upper cavoatrial junction. Very low lung volumes with diffuse hazy chest opacity. Asymmetric parenchymal opacity on the left. Cardiac enlargement. Prior median sternotomy. No evidence of pneumothorax. IMPRESSION: 1. Stable hardware positioning. 2. Low volume chest with bilateral atelectasis or pneumonia and possible layering effusions. No convincing change from 2 days ago. Electronically Signed   By: Marnee SpringJonathon   Watts M.D.   On: 04/27/2018 07:36   Dg Chest Port 1 View  Result Date: 04/25/2018 CLINICAL DATA:  Check tracheostomy placement EXAM: PORTABLE CHEST 1 VIEW COMPARISON:  04/24/2018 FINDINGS: Tracheostomy tube is noted in satisfactory position. Nasogastric catheter is noted coursing towards the stomach. The overall inspiratory effort is poor but relatively stable from the previous exam. Patchy changes are again identified in both lungs. Right-sided PICC line is again noted and stable. IMPRESSION: Tubes and lines as described above. Patchy infiltrative changes bilaterally. Electronically Signed   By: Alcide CleverMark  Lukens M.D.   On: 04/25/2018 09:13   Dg Chest Port 1 View  Result Date: 04/24/2018 CLINICAL DATA:  Acute respiratory failure EXAM: PORTABLE CHEST 1 VIEW COMPARISON:  Yesterday FINDINGS: Stable cardiomegaly.  Status post aortic valve replacement. Endotracheal tube tip is at the clavicular heads. An orogastric tube at least reaches the diaphragm, difficult to visualize due to body habitus. Right upper extremity PICC with tip at the upper cavoatrial junction. Low volume chest with hazy and streaky opacities. IMPRESSION: 1. Unchanged positioning of visible hardware. 2. Cardiomegaly, vascular congestion, and low volumes with atelectasis. Electronically Signed   By: Marnee SpringJonathon  Watts M.D.   On: 04/24/2018 07:18   Dg Chest Port 1 View  Result Date: 04/23/2018 CLINICAL DATA:  Patient status post respiratory arrest 04/07/2018. EXAM: PORTABLE CHEST 1 VIEW COMPARISON:  Single-view of the chest 04/21/2018 and 04/19/2018. FINDINGS: Endotracheal tube remains in place in good position. There is cardiomegaly and pulmonary edema. No pneumothorax. Likely small to moderate bilateral pleural effusions. IMPRESSION: No change in cardiomegaly and pulmonary edema since the most recent exam. Electronically Signed   By: Drusilla Kannerhomas  Dalessio M.D.   On: 04/23/2018 10:22   Dg Chest Port 1 View  Result Date: 04/21/2018 CLINICAL DATA:   Respiratory failure EXAM: PORTABLE CHEST 1 VIEW COMPARISON:  04/19/2018 FINDINGS: Cardiac shadow is enlarged but stable. Endotracheal tube and nasogastric catheter are noted and stable. The overall inspiratory effort is again poor. Mild right basilar infiltrative density is seen. Similar changes are noted in the left lung base as well. Mild central vascular congestion is noted. The right-sided pleural effusion is less well visualized likely related to patient positioning. IMPRESSION: Vascular congestion and bibasilar atelectatic changes. Electronically Signed   By: Alcide CleverMark  Lukens M.D.   On: 04/21/2018 06:38   Dg Chest Port 1 View  Result Date: 04/19/2018 CLINICAL DATA:  Hypoxia EXAM: PORTABLE CHEST  1 VIEW COMPARISON:  April 18, 2018 FINDINGS: Endotracheal tube tip is 4.0 cm above the carina. Nasogastric tube tip and side port are below the diaphragm. No pneumothorax. There is a right pleural effusion. There is patchy atelectatic change in both lower lobes with consolidation in the right base region. A lesser degree of consolidation is present in the medial left base. Heart is enlarged with pulmonary vascularity currently within normal limits. Patient is status post median sternotomy. No adenopathy. No bone lesions. IMPRESSION: Tube and catheter positions as described without pneumothorax. Persistent right pleural effusion with bibasilar consolidation, more on the right than on the left. There is also bibasilar atelectatic change. No new opacity evident. Electronically Signed   By: Bretta Bang III M.D.   On: 04/19/2018 07:32   Dg Chest Port 1 View  Result Date: 04/18/2018 CLINICAL DATA:  Hypoxia EXAM: PORTABLE CHEST 1 VIEW COMPARISON:  Chest CT April 18, 2018 FINDINGS: Endotracheal tube tip is 4.8 cm above the carina. Orogastric tube tip is below the diaphragm and not seen on this study. No pneumothorax. There is a small right pleural effusion with patchy consolidation in both lower lobes, more on the right  than on the left. The lungs elsewhere are clear. 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 No adenopathy evident. Patient is status post median sternotomy. No bone lesions. IMPRESSION: Tube positions as described without evident pneumothorax. Right pleural effusion with bibasilar consolidation, more on the left than on the right, stable. Underlying pulmonary vascular congestion. Electronically Signed   By: Bretta Bang III M.D.   On: 04/18/2018 11:39   Dg Chest Port 1 View  Result Date: 04/18/2018 CLINICAL DATA:  Acute respiratory distress. EXAM: PORTABLE CHEST 1 VIEW COMPARISON:  16 2019 FINDINGS: Endotracheal tube tip is 6 cm above the carina. Nasogastric tube enters the abdomen. Right subclavian central line tip at the SVC RA junction. Support artifact overlies the chest. Left lung is largely clear, possibly with mild basilar atelectasis. There is a right effusion more pronounced atelectasis in the right lower. IMPRESSION: Overlying artifact. Atelectasis right worse than left. Right effusion. Electronically Signed   By: Paulina Fusi M.D.   On: 04/18/2018 07:47   Dg Chest Port 1 View  Result Date: 04/17/2018 CLINICAL DATA:  PICC line placement EXAM: PORTABLE CHEST 1 VIEW COMPARISON:  Chest radiograph 04/17/2018 FINDINGS: Right-sided PICC line tip is in the mid SVC. There is a right IJ approach central venous catheter with its tip at the cavoatrial junction, unchanged. Endotracheal tube and gastric tube are unchanged. Lung the mediastinum are unchanged. IMPRESSION: Unchanged examination with right-sided PICC line tip in the mid SVC. Electronically Signed   By: Deatra Robinson M.D.   On: 04/17/2018 17:43   Dg Chest Port 1 View  Result Date: 04/17/2018 CLINICAL DATA:  47 year old male with a history of PICC placement EXAM: PORTABLE CHEST 1 VIEW COMPARISON:  04/17/2018, 04/14/2018 FINDINGS: Study is limited by underpenetration. Redemonstration of surgical changes of median sternotomy.  Right IJ central venous catheter is unchanged, with the tip appearing to terminate superior vena cava. Endotracheal tube terminates 4.2 cm above the carina. Gastric tube terminates out of the field of view. Lung volumes remain low with patchy airspace opacities bilaterally. No evidence of left or upper extremity PICC. IMPRESSION: Low lung volumes persist with unchanged appearance of patchy airspace opacities bilaterally. No PICC is visualized. Unchanged position of right IJ central venous catheter, endotracheal tube, gastric tube. Electronically Signed   By: Gilmer Mor  D.O.   On: 04/17/2018 15:53   Dg Chest Port 1 View  Result Date: 04/17/2018 CLINICAL DATA:  Increased acute respiratory failure, history of atrial fibrillation and diabetes EXAM: PORTABLE CHEST 1 VIEW COMPARISON:  Portable chest x-ray of 04/14/2018 FINDINGS: The tip of the endotracheal tube is approximately 5.2 cm above the carina. Opacity remains at the right lung base most consistent with atelectasis, but there is more opacity now present at the left lung base. Considerations are that of atelectasis versus pneumonia. No definite effusion is seen. Cardiomegaly is stable. IMPRESSION: 1. Tip of endotracheal tube 5.2 cm above the carina. 2. Diminished aeration with increasing basilar opacities most consistent with atelectasis. Pneumonia cannot be excluded. 3. Stable cardiomegaly. Electronically Signed   By: Dwyane Dee M.D.   On: 04/17/2018 09:10   Dg Chest Port 1 View  Result Date: 04/14/2018 CLINICAL DATA:  Hypoxia EXAM: PORTABLE CHEST 1 VIEW COMPARISON:  April 12, 2018 FINDINGS: Endotracheal tube tip is 2.0 cm above the carina. Nasogastric tube tip and side port are below the diaphragm. Central catheter tip is at the cavoatrial junction. No evident pneumothorax. There is focal consolidation in the right base. Lungs elsewhere appear clear. Note that there is overlying artifact from the cooling mat. Heart is mildly enlarged with pulmonary  vascularity normal. No bone lesions. No adenopathy evident. IMPRESSION: Tube and catheter positions as described without pneumothorax. Consolidation right base, felt to represent focal pneumonia. No similar changes elsewhere. Stable cardiac prominence. Electronically Signed   By: Bretta Bang III M.D.   On: 04/14/2018 09:16   Dg Chest Port 1 View  Result Date: 04/12/2018 CLINICAL DATA:  Dyspnea. Endotracheal tube present. Status post cardiac arrest. EXAM: PORTABLE CHEST 1 VIEW COMPARISON:  04/10/2018 FINDINGS: Endotracheal tube, right jugular central venous catheter, and nasogastric tube remain in appropriate position. Heart size is stable. Low lung volumes are seen. Atelectasis again seen in both lung bases. No new or worsening areas of pulmonary opacity are seen. No evidence of pulmonary consolidation or pneumothorax. IMPRESSION: No significant change in low lung volumes and bibasilar atelectasis. Electronically Signed   By: Myles Rosenthal M.D.   On: 04/12/2018 10:07   Dg Chest Port 1 View  Result Date: 04/10/2018 CLINICAL DATA:  Acute respiratory failure EXAM: PORTABLE CHEST 1 VIEW COMPARISON:  Earlier today at 1004 hours FINDINGS: Mildly degraded exam due to AP portable technique and patient body habitus. Endotracheal tube terminates 3.3 cm above carina.Prior median sternotomy. Nasogastric tube not well visualized distally but likely extends beyond the inferior aspect of the film. Right internal jugular line tip at low SVC. Cardiomegaly accentuated by AP portable technique. Small bilateral pleural effusions. No pneumothorax. Mild interstitial edema, superimposed upon low lung volumes. Bibasilar airspace disease. IMPRESSION: Decreased sensitivity and specificity exam due to technique related factors, as described above. Cardiomegaly with mild congestive heart failure, low lung volumes, and layering bilateral pleural effusions. Bibasilar airspace disease which is most likely atelectasis. Electronically  Signed   By: Jeronimo Greaves M.D.   On: 04/10/2018 19:42   Dg Chest Port 1 View  Result Date: 04/10/2018 CLINICAL DATA:  Hypoxia EXAM: PORTABLE CHEST 1 VIEW COMPARISON:  April 09, 2018 FINDINGS: Endotracheal tube tip is 4.3 cm above the carina. Central catheter tip is at cavoatrial junction. Nasogastric tube tip and side port below the diaphragm. No pneumothorax. There is patchy airspace consolidation in both medial lung bases with small pleural effusions bilaterally. Lungs elsewhere clear. Heart is mildly enlarged with pulmonary vascularity normal.  No adenopathy. Patient is status post median sternotomy. No bone lesions evident. IMPRESSION: Tube and catheter positions as described without pneumothorax. Suspect bibasilar pneumonia. Small pleural effusions. Stable cardiac prominence. Electronically Signed   By: Bretta Bang III M.D.   On: 04/10/2018 10:27   Dg Abd Portable 1v  Result Date: 04/16/2018 CLINICAL DATA:  Orogastric tube placement EXAM: PORTABLE ABDOMEN - 1 VIEW COMPARISON:  April 10, 2018 FINDINGS: Orogastric tube tip is at the gastroesophageal junction. There is a paucity of gas. No free air evident. IMPRESSION: Orogastric tube tip at gastroesophageal junction. Advise advancing tube approximately 10 cm to insure tip and side port in stomach. Paucity of gas is noted. While this finding may be seen normally, it may also be indicative ileus or enteritis. Bowel obstruction not felt to be likely. Visualized lung bases clear. Electronically Signed   By: Bretta Bang III M.D.   On: 04/16/2018 10:54   Dg Abd Portable 1v  Result Date: 04/10/2018 CLINICAL DATA:  Status post OG tube placement. EXAM: PORTABLE ABDOMEN - 1 VIEW COMPARISON:  Single view of the abdomen 04/07/2018. FINDINGS: OG tube tip  and sideport are both in the stomach. IMPRESSION: OG tube in good position. Electronically Signed   By: Drusilla Kanner M.D.   On: 04/10/2018 14:32   [place] Ekg Site Rite  Result Date: 04/17/2018 If Site  Rite image not attached, placement could not be confirmed due to current cardiac rhythm.   Consults: Treatment Team:  Lamar Blinks, MD Uvaldo Rising, MD Anson Fret, MD Thana Farr, MD Vernie Murders, MD   Subjective:    Overnight Issues: over the last 24 hours patient has done quite well, presently on continuous trach collar not require mechanical ventilation. He is awake, alert, secretions have improved complaining of some sciatic discomfort  Objective:  Vital signs for last 24 hours: Temp:  [98.4 F (36.9 C)-98.8 F (37.1 C)] 98.8 F (37.1 C) (07/03 0500) Pulse Rate:  [91-207] 117 (07/03 0600) Resp:  [13-20] 14 (07/03 0600) BP: (132-194)/(88-115) 156/98 (07/03 0600) SpO2:  [88 %-98 %] 95 % (07/03 0600) FiO2 (%):  [40 %] 40 % (07/03 0354) Weight:  [367 lb 15.2 oz (166.9 kg)] 367 lb 15.2 oz (166.9 kg) (07/03 0500)  Hemodynamic parameters for last 24 hours:    Intake/Output from previous day: 07/02 0701 - 07/03 0700 In: 1918 [I.V.:488; NG/GT:1430] Out: 3150 [Urine:3150]  Intake/Output this shift: No intake/output data recorded.  Vent settings for last 24 hours: FiO2 (%):  [40 %] 40 %  Physical Exam:  Vital signs stable, afebrile HEENT: Tracheostomy in place, minimal secretions Cardiovascular: Atrial fibrillation with controlled ventricular response Pulmonary: Clear to auscultationAbdominal: Positive bowel sounds, soft exam Extremity: 1+ edema noted Neurologic: Moves all extremities  Assessment/Plan:    Ventilator dependent respiratory failure. Status post tracheostomy. Doing well with continuous trach collar.  History of V. Tach cardiac arrest. Chronic atrial fibrillation with rapid ventricular response, status post mechanical aortic valve replacement. Will continue heparin infusion, patient is on metoprolol and diltiazem for rate control  Renal failure. Has been stable, being followed by nephrology. Had an episode of hyperkalemia most likely  secondary to Glucerna which has improved. Will recheck labs today  Anemia. Mild with no evidence of active bleeding  Left peroneal deep venous thrombosis, presently on systemic anticoagulation  Severe insulin resistance  Presently receiving enteral nutrition. Was unable to place PEG tube secondary to patient's anatomy. Nutrition is following  Speech pathology evaluation, continue physical therapy  Tora Kindred, DO  Aritza Brunet 05/10/2018  *Care during the described time interval was provided by me and/or other providers on the critical care team.  I have reviewed this patient's available data, including medical history, events of note, physical examination and test results as part of my evaluation.

## 2018-05-11 ENCOUNTER — Inpatient Hospital Stay: Payer: Medicare HMO

## 2018-05-11 LAB — BLOOD GAS, ARTERIAL
ACID-BASE EXCESS: 7.8 mmol/L — AB (ref 0.0–2.0)
ACID-BASE EXCESS: 9.3 mmol/L — AB (ref 0.0–2.0)
BICARBONATE: 40.6 mmol/L — AB (ref 20.0–28.0)
Bicarbonate: 38.9 mmol/L — ABNORMAL HIGH (ref 20.0–28.0)
FIO2: 0.4
FIO2: 0.5
MECHVT: 500 mL
O2 SAT: 83.3 %
O2 Saturation: 91.7 %
PATIENT TEMPERATURE: 37
PEEP: 5 cmH2O
PH ART: 7.29 — AB (ref 7.350–7.450)
Patient temperature: 37
RATE: 22 resp/min
pCO2 arterial: 114 mmHg (ref 32.0–48.0)
pCO2 arterial: 81 mmHg (ref 32.0–48.0)
pH, Arterial: 7.16 — CL (ref 7.350–7.450)
pO2, Arterial: 61 mmHg — ABNORMAL LOW (ref 83.0–108.0)
pO2, Arterial: 70 mmHg — ABNORMAL LOW (ref 83.0–108.0)

## 2018-05-11 LAB — GLUCOSE, CAPILLARY
GLUCOSE-CAPILLARY: 286 mg/dL — AB (ref 70–99)
GLUCOSE-CAPILLARY: 291 mg/dL — AB (ref 70–99)
GLUCOSE-CAPILLARY: 329 mg/dL — AB (ref 70–99)
GLUCOSE-CAPILLARY: 280 mg/dL — AB (ref 70–99)
GLUCOSE-CAPILLARY: 207 mg/dL — AB (ref 70–99)
GLUCOSE-CAPILLARY: 160 mg/dL — AB (ref 70–99)
Glucose-Capillary: 93 mg/dL (ref 70–99)

## 2018-05-11 LAB — HEPARIN LEVEL (UNFRACTIONATED)
HEPARIN UNFRACTIONATED: 0.94 [IU]/mL — AB (ref 0.30–0.70)
HEPARIN UNFRACTIONATED: 0.61 [IU]/mL (ref 0.30–0.70)
Heparin Unfractionated: 0.97 IU/mL — ABNORMAL HIGH (ref 0.30–0.70)

## 2018-05-11 MED ORDER — ONDANSETRON HCL 4 MG/2ML IJ SOLN
4.0000 mg | Freq: Four times a day (QID) | INTRAMUSCULAR | Status: DC | PRN
  Administered 2018-05-11 – 2018-05-25 (×12): 4 mg via INTRAVENOUS
  Filled 2018-05-11 (×14): qty 2

## 2018-05-11 MED ORDER — METOCLOPRAMIDE HCL 5 MG/ML IJ SOLN
5.0000 mg | Freq: Four times a day (QID) | INTRAMUSCULAR | Status: DC | PRN
  Administered 2018-05-11 – 2018-05-12 (×2): 5 mg via INTRAVENOUS
  Filled 2018-05-11 (×2): qty 2

## 2018-05-11 NOTE — Progress Notes (Signed)
RN notified Dr Lonn Georgiaonforti that patient's residual was 450ml.  MD states to keep tube feeding off for now and tell night RN to check residual and restart tube feeding when residual is <22350ml.

## 2018-05-11 NOTE — Progress Notes (Signed)
RN notified Dr Lonn Georgiaonforti that patient seems very weak and lethargic, more than usual per wife.  Patient's respirations also slower at times, seen as low as 10bpm.  Patient does have Klonopin scheduled for this morning which he received, a 2mg  dose is scheduled for tonight. Dr Lonn Georgiaonforti gave order to hold tonight's dose of Klonopin and to check a blood gas.

## 2018-05-11 NOTE — Progress Notes (Signed)
RN notified Dr Lonn Georgiaonforti of patient's complaining of abdominal pain though patient refuses pain medication, rate pains as sharp 7/10.  Patient also had an episode where he brady down into the 30s but cam back up to the 70s-90s.   Dr Lonn Georgiaonforti states "I'll come to see him"

## 2018-05-11 NOTE — Progress Notes (Signed)
Chaplain could not assess the patient's spiritual condition, but his wife continues to ask for prayer. Tomorrow is the patient's 3rd wedding anniversary. Wife is encouraged by the patient's improvement and continues to provide diligent and loving care. Chaplain's conversation with wife was brief. She identified the difficulty of balancing care for the patient with other responsibilities, noting "You do what you have to do." Chaplain offered spiritual and emotional support and energetic prayer.

## 2018-05-11 NOTE — Progress Notes (Signed)
[facility]   PATIENT NAME: Alexander HeadyOtis Alexander    MR#:  [id number]  DATE OF BIRTH:  11/15/1970  SUBJECTIVE:  CHIEF COMPLAINT:   Chief Complaint  Patient presents with  . Back Pain  stable condition  REVIEW OF SYSTEMS:  CONSTITUTIONAL: No fever, fatigue or weakness.  EYES: No blurred or double vision.  EARS, NOSE, AND THROAT: No tinnitus or ear pain.  RESPIRATORY: No cough, shortness of breath, wheezing or hemoptysis.  CARDIOVASCULAR: No chest pain, orthopnea, edema.  GASTROINTESTINAL: No nausea, vomiting, diarrhea or abdominal pain.  GENITOURINARY: No dysuria, hematuria.  ENDOCRINE: No polyuria, nocturia,  HEMATOLOGY: No anemia, easy bruising or bleeding SKIN: No rash or lesion. MUSCULOSKELETAL: No joint pain or arthritis.   NEUROLOGIC: No tingling, numbness, weakness.  PSYCHIATRY: No anxiety or depression.   ROS  DRUG ALLERGIES:   Allergies  Allergen Reactions  . Lexapro [Escitalopram Oxalate] Swelling    VITALS:  Blood pressure 132/87, pulse (!) 103, temperature 98 F (36.7 C), temperature source Oral, resp. rate 10, height 5\' 10"  (1.778 m), weight (!) 165.1 kg (363 lb 15.7 oz), SpO2 92 %.  PHYSICAL EXAMINATION:  GENERAL:  47 y.o.-year-old patient lying in the bed with no acute distress.  EYES: Pupils equal, round, reactive to light and accommodation. No scleral icterus. Extraocular muscles intact.  HEENT: Head atraumatic, normocephalic. Oropharynx and nasopharynx clear.  NECK:  Supple, no jugular venous distention. No thyroid enlargement, no tenderness.  LUNGS: Normal breath sounds bilaterally, no wheezing, rales,rhonchi or crepitation. No use of accessory muscles of respiration.  CARDIOVASCULAR: S1, S2 normal. No murmurs, rubs, or gallops.  ABDOMEN: Soft, nontender, nondistended. Bowel sounds present. No organomegaly or mass.  EXTREMITIES: No pedal edema, cyanosis, or clubbing.  NEUROLOGIC: Cranial nerves II through XII are  intact. Muscle strength 5/5 in all extremities. Sensation intact. Gait not checked.  PSYCHIATRIC: The patient is alert and oriented x 3.  SKIN: No obvious rash, lesion, or ulcer.   Physical Exam LABORATORY PANEL:   CBC Recent Labs  Lab 05/10/18 0745  WBC 7.5  HGB 12.2*  HCT 37.0*  PLT 251   ------------------------------------------------------------------------------------------------------------------  Chemistries  Recent Labs  Lab 05/06/18 0435  05/09/18 0534 05/10/18 0745  NA 136   < > 137 136  K 5.3*   < > 5.1 4.9  CL 100   < > 91* 92*  CO2 29   < > 38* 33*  GLUCOSE 171*   < > 142* 178*  BUN 62*   < > 57* 54*  CREATININE 2.20*   < > 2.28* 2.23*  CALCIUM 9.0   < > 9.0 9.6  MG 2.1  --   --   --   AST  --    < > 22  --   ALT  --    < > 21  --   ALKPHOS  --    < > 64  --   BILITOT  --    < > 0.9  --    < > = values in this interval not displayed.   ------------------------------------------------------------------------------------------------------------------  Cardiac Enzymes No results for input(s): TROPONINI in the last 168 hours. ------------------------------------------------------------------------------------------------------------------  RADIOLOGY:  No results found.  ASSESSMENT AND PLAN:   47 year old male with past medical history significant for A. fib status post ablation, aortic valve replacement on warfarin, history of congestive heart failure, CKD, morbid obesity, diabetes and sleep apnea who was getting an outpatient MRI of his lower back  had a cardiac arrest.  * Acute cardiopulmonary arrest Pulseless VT Difficult weaning from vent,s/p tracheostomy Continuemechanical ventilation with weaning as tolerated  PEG by  IR unsuccessful since safe window not found.  * AcuteMRSA/VAP Treated withcourse ofLinezolid Continue trach collar/vent protocol   *Atrial fibrillationwith RVR Stable Continuemetoprolol,Cardizem, amiodarone,  continue Heparin drip  * History of mechanical aortic valve replacement Onheparin drip  * Acute toxic metabolic encephalopathy  Neurology input appreciated  Started on Kepprafor possible partial seizures. Later stopped.  * Chronic diabetes mellitus type 2 Stable on current regiment  * Acute renal failurew/ CKD stage III Secondary toATN  * Acute septic shock, MRSA pneumonia Resolved complete 14 days linezolid  Disposition to LTAC when able   All the records are reviewed and case discussed with Care Management/Social Workerr. Management plans discussed with the patient, family and they are in agreement.  CODE STATUS: 35  TOTAL TIME TAKING CARE OF THIS PATIENT: 35 minutes.     POSSIBLE D/C IN 5 DAYS, DEPENDING ON CLINICAL CONDITION.   Evelena Asa Salary M.D on 05/11/2018   Between 7am to 6pm - Pager - [phone]  After 6pm go to [email or web address] - password [email or web address]  [facility]  Office  [phone]  CC: Primary care physician; Erasmo Downer, MD  Note: This dictation was prepared with Dragon dictation along with smaller phrase technology. Any transcriptional errors that result from this process are unintentional.

## 2018-05-11 NOTE — Progress Notes (Signed)
2020 Pt continues to vomit despite administration of Reglan 5mg  IVP and Zofran 4mg  IVP.  Pt's residual volume 400ml.  Continue to hold tube feedings.  Luci Bankukov, NP notified.

## 2018-05-11 NOTE — Progress Notes (Signed)
Pt with 3 episodes of vomiting since 1945.  Appears to be clear and primarily secretions.  Tube feeds sopped at 1500 today and Zofran administered.  Notified Luci Bankukov, NP.  Stated to suction more frequently and ordered Reglan.

## 2018-05-11 NOTE — Progress Notes (Signed)
ANTICOAGULATION CONSULT NOTE   Pharmacy Consult for heparin drip management Indication: atrial fibrillation and aortic valve replacement   Allergies  Allergen Reactions  . Lexapro [Escitalopram Oxalate] Swelling    Patient Measurements: Height: 5\' 10"  (177.8 cm) Weight: (!) 363 lb 15.7 oz (165.1 kg) IBW/kg (Calculated) : 73 Heparin Dosing Weight: 121 kg (updated 6/21)  Vital Signs: Temp: 97.8 F (36.6 C) (07/04 1200) Temp Source: Oral (07/04 0800) BP: 112/79 (07/04 1214) Pulse Rate: 91 (07/04 1200)  Labs: Recent Labs    05/08/18 1732 05/09/18 0534  05/10/18 0530 05/10/18 0745 05/11/18 0526 05/11/18 1328  HGB 11.8* 11.3*  --   --  12.2*  --   --   HCT 35.8* 34.4*  --   --  37.0*  --   --   PLT 267 246  --   --  251  --   --   HEPARINUNFRC 0.60  --    < > 0.40  --  0.97* 0.94*  CREATININE  --  2.28*  --   --  2.23*  --   --    < > = values in this interval not displayed.    Estimated Creatinine Clearance: 62.2 mL/min (A) (by C-G formula based on SCr of 2.23 mg/dL (H)).   Medical History: Past Medical History:  Diagnosis Date  . Allergy   . Anxiety   . Arrhythmia   . Arthritis   . Atrial fibrillation (HCC)   . CHF (congestive heart failure) (HCC)   . CKD (chronic kidney disease)   . Diabetes mellitus without complication (HCC)   . Diabetes mellitus, type II (HCC)   . Heart failure (HCC)   . Hyperlipidemia   . Hypertension   . Pancreatitis   . Sleep apnea     Medications:  Scheduled:  . amLODipine  10 mg Oral Daily  . budesonide (PULMICORT) nebulizer solution  0.25 mg Nebulization Q6H  . chlorhexidine  15 mL Mouth Rinse BID  . clonazePAM  1 mg Per Tube Daily  . clonazePAM  2 mg Oral QHS  . diltiazem  60 mg Oral Q6H  . famotidine  20 mg Per Tube Daily  . feeding supplement (PRO-STAT SUGAR FREE 64)  30 mL Per Tube Daily  . fentaNYL  150 mcg Transdermal Q72H  . fluvoxaMINE  25 mg Per Tube QHS  . free water  250 mL Per Tube Q6H  . insulin aspart   0-20 Units Subcutaneous Q4H  . insulin glargine  85 Units Subcutaneous BID  . ipratropium-albuterol  3 mL Nebulization Q6H  . mouth rinse  15 mL Mouth Rinse 10 times per day  . metoprolol tartrate  50 mg Per Tube BID  . sodium chloride flush  10-40 mL Intracatheter Q12H   Infusions:  . feeding supplement (NEPRO CARB STEADY) 65 mL/hr at 05/11/18 0600  . heparin 1,700 Units/hr (05/11/18 0757)    Assessment: Pharmacy consulted for warfarin and heparin drip management for 47 yo male admitted to the ICU s/p cardiac arrest. Patient has history significant for aortic valve replacement, atrial fibrillation and CHF. Head CT is negative for infarction or hemorrhage.   Patient takes warfarin 5mg  daily as an outpatient for goal INR 2.5-3.5.    Goal of Therapy:  INR 2-3 Heparin level 0.3-0.7 units/ml Monitor platelets by anticoagulation protocol: Yes   Plan:  07/04 @ 0530 HL 0.97 supratherapeutic. Will decrease rate to 1700 units/hr and will recheck HL @ 1100. CBC stable.  07/04 HL supratherapeutic  x 1. Decrease to 1500 units/hr and recheck HL in 6 hours.  Yamato Kopf A. Dahlia Bailiff, PharmD, BCPS Clinical Pharmacist 05/11/2018

## 2018-05-11 NOTE — Progress Notes (Signed)
SLP Cancellation Note  Patient Details Name: Pricilla RiffleOtis Leon Pelto MRN: [id number] DOB: 1971/10/15   Cancelled treatment:       Reason Eval/Treat Not Completed: Medical issues which prohibited therapy;Patient not medically ready(chart reviewed; consulted NSG re: pt's status).  Pt is requiring increased FIO2 support at 60% (since yesterday pm per chart notes) and has increased somnolence today per NSG. Pt is not appropriate for PMV use w/ this presentation and the need for increased O2 support. This was explained to NSG; ST services will f/u next 1-3 days for appropriateness for use of PMV again. NSG agreed.  Of note, PMV was being attempted in hopes for pt to wear during future evaluation of swallowing for oral intake. Suspect pt will need objective assessment of swallowing d/t the extended illness, acuity of illness.    Jerilynn SomKatherine Watson, MS, CCC-SLP Watson,Katherine 05/11/2018, 3:51 PM

## 2018-05-11 NOTE — Progress Notes (Signed)
RN called and spoke with Dr. Lonn Georgiaonforti and made MD aware that patient vomited and is continuing to gag and nothing is ordered for nausea.  MD gave order for PRN Zofran and order for chest xray.

## 2018-05-11 NOTE — Progress Notes (Signed)
ANTICOAGULATION CONSULT NOTE   Pharmacy Consult for heparin drip management Indication: atrial fibrillation and aortic valve replacement   Allergies  Allergen Reactions  . Lexapro [Escitalopram Oxalate] Swelling    Patient Measurements: Height: 5\' 10"  (177.8 cm) Weight: (!) 363 lb 15.7 oz (165.1 kg) IBW/kg (Calculated) : 73 Heparin Dosing Weight: 121 kg (updated 6/21)  Vital Signs: Temp: 98.2 F (36.8 C) (07/04 0400) Temp Source: Oral (07/04 0400) BP: 119/74 (07/04 0500) Pulse Rate: 100 (07/04 0500)  Labs: Recent Labs    05/08/18 1732 05/09/18 0534 05/09/18 0606 05/10/18 0530 05/10/18 0745 05/11/18 0526  HGB 11.8* 11.3*  --   --  12.2*  --   HCT 35.8* 34.4*  --   --  37.0*  --   PLT 267 246  --   --  251  --   HEPARINUNFRC 0.60  --  0.49 0.40  --  0.97*  CREATININE  --  2.28*  --   --  2.23*  --     Estimated Creatinine Clearance: 62.2 mL/min (A) (by C-G formula based on SCr of 2.23 mg/dL (H)).   Medical History: Past Medical History:  Diagnosis Date  . Allergy   . Anxiety   . Arrhythmia   . Arthritis   . Atrial fibrillation (HCC)   . CHF (congestive heart failure) (HCC)   . CKD (chronic kidney disease)   . Diabetes mellitus without complication (HCC)   . Diabetes mellitus, type II (HCC)   . Heart failure (HCC)   . Hyperlipidemia   . Hypertension   . Pancreatitis   . Sleep apnea     Medications:  Scheduled:  . amLODipine  10 mg Oral Daily  . budesonide (PULMICORT) nebulizer solution  0.25 mg Nebulization Q6H  . chlorhexidine  15 mL Mouth Rinse BID  . clonazePAM  1 mg Per Tube Daily  . clonazePAM  2 mg Oral QHS  . diltiazem  60 mg Oral Q6H  . famotidine  20 mg Per Tube Daily  . feeding supplement (PRO-STAT SUGAR FREE 64)  30 mL Per Tube Daily  . fentaNYL  150 mcg Transdermal Q72H  . fluvoxaMINE  25 mg Per Tube QHS  . free water  250 mL Per Tube Q6H  . insulin aspart  0-20 Units Subcutaneous Q4H  . insulin glargine  85 Units Subcutaneous BID   . ipratropium-albuterol  3 mL Nebulization Q6H  . mouth rinse  15 mL Mouth Rinse 10 times per day  . metoprolol tartrate  50 mg Per Tube BID  . sodium chloride flush  10-40 mL Intracatheter Q12H   Infusions:  . feeding supplement (NEPRO CARB STEADY) 1,000 mL (05/11/18 0310)  . heparin 1,900 Units/hr (05/10/18 2200)    Assessment: Pharmacy consulted for warfarin and heparin drip management for 47 yo male admitted to the ICU s/p cardiac arrest. Patient has history significant for aortic valve replacement, atrial fibrillation and CHF. Head CT is negative for infarction or hemorrhage.   Patient takes warfarin 5mg  daily as an outpatient for goal INR 2.5-3.5.    Goal of Therapy:  INR 2-3 Heparin level 0.3-0.7 units/ml Monitor platelets by anticoagulation protocol: Yes   Plan:  07/04 @ 0530 HL 0.97 supratherapeutic. Will decrease rate to 1700 units/hr and will recheck HL @ 1100. CBC stable.  Thomasene Rippleavid Adley Mazurowski, PharmD, BCPS Clinical Pharmacist 05/11/2018

## 2018-05-11 NOTE — Progress Notes (Signed)
Patient with an episode of vomiting.  MD aware.  Zofran administered.  Tube feeding held at this time.

## 2018-05-11 NOTE — Progress Notes (Addendum)
OT Cancellation Note  Patient Details Name: Pricilla RiffleOtis Leon Toste MRN: [id number] DOB: 06-17-1971   Cancelled Treatment:    Reason Eval/Treat Not Completed: Medical issues which prohibited therapy. Per chart review, pt noted back on mechanical vent this date 2/2 hypercapnic failure, increased somnolence, vomited earlier this date. Stat chest x-ray. Will hold OT tx this date and re-attempt next date as pt is medically appropriate.   Richrd PrimeJamie Stiller, MPH, MS, OTR/L ascom [phone] 05/11/18, 3:51 PM

## 2018-05-11 NOTE — Progress Notes (Signed)
Patient placed back on ventilator due to abnormal abg.   Patient transferred back to ICU status.

## 2018-05-11 NOTE — Progress Notes (Signed)
ANTICOAGULATION CONSULT NOTE   Pharmacy Consult for heparin drip management Indication: atrial fibrillation and aortic valve replacement   Allergies  Allergen Reactions  . Lexapro [Escitalopram Oxalate] Swelling    Patient Measurements: Height: 5\' 10"  (177.8 cm) Weight: (!) 363 lb 15.7 oz (165.1 kg) IBW/kg (Calculated) : 73 Heparin Dosing Weight: 121 kg (updated 6/21)  Vital Signs: Temp: 97.7 F (36.5 C) (07/04 2000) Temp Source: Axillary (07/04 2000) BP: 110/88 (07/04 2000) Pulse Rate: 92 (07/04 2000)  Labs: Recent Labs    05/09/18 0534  05/10/18 0745 05/11/18 0526 05/11/18 1328 05/11/18 2040  HGB 11.3*  --  12.2*  --   --   --   HCT 34.4*  --  37.0*  --   --   --   PLT 246  --  251  --   --   --   HEPARINUNFRC  --    < >  --  0.97* 0.94* 0.61  CREATININE 2.28*  --  2.23*  --   --   --    < > = values in this interval not displayed.    Estimated Creatinine Clearance: 62.2 mL/min (A) (by C-G formula based on SCr of 2.23 mg/dL (H)).   Medical History: Past Medical History:  Diagnosis Date  . Allergy   . Anxiety   . Arrhythmia   . Arthritis   . Atrial fibrillation (HCC)   . CHF (congestive heart failure) (HCC)   . CKD (chronic kidney disease)   . Diabetes mellitus without complication (HCC)   . Diabetes mellitus, type II (HCC)   . Heart failure (HCC)   . Hyperlipidemia   . Hypertension   . Pancreatitis   . Sleep apnea     Medications:  Scheduled:  . amLODipine  10 mg Oral Daily  . budesonide (PULMICORT) nebulizer solution  0.25 mg Nebulization Q6H  . chlorhexidine  15 mL Mouth Rinse BID  . clonazePAM  1 mg Per Tube Daily  . clonazePAM  2 mg Oral QHS  . diltiazem  60 mg Oral Q6H  . famotidine  20 mg Per Tube Daily  . feeding supplement (PRO-STAT SUGAR FREE 64)  30 mL Per Tube Daily  . fentaNYL  150 mcg Transdermal Q72H  . fluvoxaMINE  25 mg Per Tube QHS  . free water  250 mL Per Tube Q6H  . insulin aspart  0-20 Units Subcutaneous Q4H  . insulin  glargine  85 Units Subcutaneous BID  . ipratropium-albuterol  3 mL Nebulization Q6H  . mouth rinse  15 mL Mouth Rinse 10 times per day  . metoprolol tartrate  50 mg Per Tube BID  . sodium chloride flush  10-40 mL Intracatheter Q12H   Infusions:  . feeding supplement (NEPRO CARB STEADY) Stopped (05/11/18 1500)  . heparin 1,500 Units/hr (05/11/18 2000)    Assessment: Pharmacy consulted for warfarin and heparin drip management for 47 yo male admitted to the ICU s/p cardiac arrest. Patient has history significant for aortic valve replacement, atrial fibrillation and CHF. Head CT is negative for infarction or hemorrhage.   Patient takes warfarin 5mg  daily as an outpatient for goal INR 2.5-3.5.    Goal of Therapy:  INR 2-3 Heparin level 0.3-0.7 units/ml Monitor platelets by anticoagulation protocol: Yes   Plan:  07/04 @ 0530 HL 0.97 supratherapeutic. Will decrease rate to 1700 units/hr and will recheck HL @ 1100. CBC stable.  07/04 @1328  HL supratherapeutic x 1. Decrease to 1500 units/hr and recheck HL in  6 hours.  07/04 @ 2040  HL therapeutic at 0.61. Will continue current infusion rate of 1500units/hr and recheck confirmatory level in 6 hours. CBC with AM labs per protocol.    Gardner CandleSheema M Miski Feldpausch, PharmD, BCPS Clinical Pharmacist 05/11/2018 9:10 PM

## 2018-05-11 NOTE — Progress Notes (Signed)
Follow up - Critical Care Medicine Note  Patient Details:    Alexander Alexander is an 47 y.o. male.with atrial fibrillation, status post ablation and aortic valve replacement on long term warfarin, CHF, CKD, diabetes, obstructive sleep apnea and hypertension, suffered a pulseless VT cardiac arrest in MRI when he was being evaluated for back pain and sciatica, subsequently intubated in the intensive care unit. Difficult airway noted   Lines, Airways, Drains: PICC Triple Lumen 04/17/18 PICC Right Cephalic 39 cm 0 cm (Active)  Indication for Insertion or Continuance of Line Prolonged intravenous therapies 05/09/2018  8:44 PM  Exposed Catheter (cm) 0 cm 05/01/2018  5:37 PM  Site Assessment Clean;Dry;Intact 05/09/2018  8:44 PM  Lumen #1 Status Infusing;Flushed;Blood return noted 05/09/2018  8:44 PM  Lumen #2 Status Flushed;Saline locked;Blood return noted 05/09/2018  8:44 PM  Lumen #3 Status Flushed;Saline locked;Blood return noted 05/09/2018  8:44 PM  Dressing Type Transparent;Occlusive 05/09/2018  8:44 PM  Dressing Status Clean;Dry;Intact;Antimicrobial disc in place 05/09/2018  8:44 PM  Line Care Connections checked and tightened 05/09/2018  8:44 PM  Dressing Intervention New dressing;Dressing changed;Antimicrobial disc changed 05/08/2018  6:00 PM  Dressing Change Due 05/15/18 05/09/2018  8:44 PM     NG/OG Tube Nasogastric Right nare Xray Documented cm marking at nare/ corner of mouth 65 cm (Active)  Cm Marking at Nare/Corner of Mouth (if applicable) 65 cm 05/09/2018  2:30 PM  Site Assessment Clean;Dry;Intact 05/10/2018  4:30 AM  Ongoing Placement Verification No acute changes, not attributed to clinical condition;No change in respiratory status;No change in cm markings or external length of tube from initial placement 05/09/2018  2:30 PM  Status Infusing tube feed 05/10/2018  4:30 AM  Intake (mL) 30 mL 05/05/2018  5:00 PM  Output (mL) 850 mL 05/07/2018  4:00 AM     Urethral Catheter C Green, Rn Double-lumen 14 Fr.  (Active)  Indication for Insertion or Continuance of Catheter Acute urinary retention 05/10/2018  4:30 AM  Site Assessment Clean;Intact 05/10/2018  4:30 AM  Catheter Maintenance Bag below level of bladder;Catheter secured;Drainage bag/tubing not touching floor;Insertion date on drainage bag;No dependent loops 05/10/2018  4:30 AM  Collection Container Standard drainage bag 05/09/2018  2:30 PM  Securement Method Securing device (Describe) 05/10/2018  4:30 AM  Urinary Catheter Interventions Unclamped 05/09/2018  2:30 PM  Output (mL) 300 mL 05/09/2018 11:00 AM    Anti-infectives:  Anti-infectives (From admission, onward)   Start     Dose/Rate Route Frequency Ordered Stop   04/28/18 1100  ceFAZolin (ANCEF) 3 g in dextrose 5 % 50 mL IVPB     3 g 100 mL/hr over 30 Minutes Intravenous  Once 04/28/18 0058 04/28/18 1058   04/19/18 1000  linezolid (ZYVOX) IVPB 600 mg  Status:  Discontinued     600 mg 300 mL/hr over 60 Minutes Intravenous Every 12 hours 04/19/18 0826 04/28/18 1011   04/18/18 1330  ceFEPIme (MAXIPIME) 2 g in sodium chloride 0.9 % 100 mL IVPB  Status:  Discontinued     2 g 200 mL/hr over 30 Minutes Intravenous Every 12 hours 04/18/18 1322 04/21/18 1108   04/18/18 0800  vancomycin (VANCOCIN) 1,250 mg in sodium chloride 0.9 % 250 mL IVPB  Status:  Discontinued     1,250 mg 166.7 mL/hr over 90 Minutes Intravenous Every 24 hours 04/18/18 0621 04/19/18 0826   04/17/18 0630  vancomycin (VANCOCIN) 1,250 mg in sodium chloride 0.9 % 250 mL IVPB     1,250 mg 166.7 mL/hr over  90 Minutes Intravenous  Once 04/17/18 0618 04/17/18 0855   04/14/18 1430  vancomycin (VANCOCIN) IVPB 1000 mg/200 mL premix  Status:  Discontinued     1,000 mg 200 mL/hr over 60 Minutes Intravenous Every 12 hours 04/14/18 1348 04/16/18 1450   04/11/18 2000  vancomycin (VANCOCIN) 1,250 mg in sodium chloride 0.9 % 250 mL IVPB  Status:  Discontinued     1,250 mg 166.7 mL/hr over 90 Minutes Intravenous Every 18 hours 04/11/18 1548  04/14/18 1348   04/10/18 1400  vancomycin (VANCOCIN) 1,250 mg in sodium chloride 0.9 % 250 mL IVPB  Status:  Discontinued     1,250 mg 166.7 mL/hr over 90 Minutes Intravenous Every 12 hours 04/10/18 1149 04/11/18 1548   04/10/18 1200  Ampicillin-Sulbactam (UNASYN) 3 g in sodium chloride 0.9 % 100 mL IVPB  Status:  Discontinued     3 g 200 mL/hr over 30 Minutes Intravenous Every 6 hours 04/10/18 0851 04/10/18 1131   04/09/18 1800  vancomycin (VANCOCIN) 1,500 mg in sodium chloride 0.9 % 500 mL IVPB  Status:  Discontinued     1,500 mg 250 mL/hr over 120 Minutes Intravenous Every 18 hours 04/09/18 1303 04/10/18 0750   04/09/18 1045  vancomycin (VANCOCIN) IVPB 1000 mg/200 mL premix     1,000 mg 200 mL/hr over 60 Minutes Intravenous  Once 04/09/18 1041 04/09/18 1307   04/07/18 1400  piperacillin-tazobactam (ZOSYN) IVPB 3.375 g  Status:  Discontinued     3.375 g 12.5 mL/hr over 240 Minutes Intravenous Every 8 hours 04/07/18 1015 04/10/18 0851      Microbiology: Results for orders placed or performed during the hospital encounter of 04/07/18  MRSA PCR Screening     Status: None   Collection Time: 04/07/18  9:49 AM  Result Value Ref Range Status   MRSA by PCR NEGATIVE NEGATIVE Final    Comment:        The GeneXpert MRSA Assay (FDA approved for NASAL specimens only), is one component of a comprehensive MRSA colonization surveillance program. It is not intended to diagnose MRSA infection nor to guide or monitor treatment for MRSA infections. Performed at [facility], [address] [place], [place] [address]   Culture, respiratory (NON-Expectorated)     Status: None   Collection Time: 04/08/18 10:53 AM  Result Value Ref Range Status   Specimen Description   Final    TRACHEAL ASPIRATE Performed at [facility], [address] [place], [place] [address]    Special Requests   Final    Normal Performed at [facility], [address] [place],  [place] [address]    Gram Stain   Final    ABUNDANT WBC PRESENT, PREDOMINANTLY PMN NO SQUAMOUS EPITHELIAL CELLS SEEN MODERATE GRAM POSITIVE COCCI IN CLUSTERS FEW GRAM NEGATIVE COCCOBACILLI RARE GRAM POSITIVE RODS Performed at [facility], [address], [place], [place] [address]    Culture   Final    ABUNDANT METHICILLIN RESISTANT STAPHYLOCOCCUS AUREUS   Report Status 04/10/2018 FINAL  Final   Organism ID, Bacteria METHICILLIN RESISTANT STAPHYLOCOCCUS AUREUS  Final      Susceptibility   Methicillin resistant staphylococcus aureus - MIC*    CIPROFLOXACIN >=8 RESISTANT Resistant     ERYTHROMYCIN >=8 RESISTANT Resistant     GENTAMICIN <=0.5 SENSITIVE Sensitive     OXACILLIN >=4 RESISTANT Resistant     TETRACYCLINE <=1 SENSITIVE Sensitive     VANCOMYCIN 1 SENSITIVE Sensitive     TRIMETH/SULFA <=10  SENSITIVE Sensitive     CLINDAMYCIN <=0.25 SENSITIVE Sensitive     RIFAMPIN <=0.5 SENSITIVE Sensitive     Inducible Clindamycin NEGATIVE Sensitive     * ABUNDANT METHICILLIN RESISTANT STAPHYLOCOCCUS AUREUS  CULTURE, BLOOD (ROUTINE X 2) w Reflex to ID Panel     Status: None   Collection Time: 04/08/18 11:43 AM  Result Value Ref Range Status   Specimen Description BLOOD LAC  Final   Special Requests   Final    BOTTLES DRAWN AEROBIC AND ANAEROBIC Blood Culture adequate volume   Culture   Final    NO GROWTH 5 DAYS Performed at [facility], [address] [place], [place] [address]    Report Status 04/13/2018 FINAL  Final  CULTURE, BLOOD (ROUTINE X 2) w Reflex to ID Panel     Status: None   Collection Time: 04/08/18 11:43 AM  Result Value Ref Range Status   Specimen Description BLOOD LT HAND  Final   Special Requests   Final    BOTTLES DRAWN AEROBIC AND ANAEROBIC Blood Culture adequate volume   Culture   Final    NO GROWTH 5 DAYS Performed at [facility], [address] [place], [place] [address]    Report Status 04/13/2018 FINAL  Final  Urine Culture      Status: None   Collection Time: 04/08/18  1:16 PM  Result Value Ref Range Status   Specimen Description   Final    URINE, CATHETERIZED Performed at [facility], [address] [place], [place] [address]    Special Requests   Final    Normal Performed at [facility], [address], [place], [place] [address]    Culture   Final    NO GROWTH Performed at [facility], [address] [place], [place] [address]    Report Status 04/09/2018 FINAL  Final  Urine Culture     Status: None   Collection Time: 04/12/18  6:55 AM  Result Value Ref Range Status   Specimen Description   Final    URINE, RANDOM Performed at [facility], [address] [place], [place] [address]    Special Requests   Final    NONE Performed at [facility], [address], [place], [place] [address]    Culture   Final    NO GROWTH Performed at [facility], [address] [place], [place] [address]    Report Status 04/13/2018 FINAL  Final  CULTURE, BLOOD (ROUTINE X 2) w Reflex to ID Panel     Status: None   Collection Time: 04/12/18  7:30 AM  Result Value Ref Range Status   Specimen Description BLOOD BLOOD LEFT HAND  Final   Special Requests   Final    BOTTLES DRAWN AEROBIC AND ANAEROBIC Blood Culture adequate volume   Culture   Final    NO GROWTH 5 DAYS Performed at [facility], [address] [place], [place] [address]    Report Status 04/17/2018 FINAL  Final  CULTURE, BLOOD (ROUTINE X 2) w Reflex to ID Panel     Status: None   Collection Time: 04/12/18  7:46 AM  Result Value Ref Range Status   Specimen Description BLOOD BLOOD LEFT HAND  Final   Special Requests   Final    BOTTLES DRAWN AEROBIC AND ANAEROBIC Blood Culture adequate volume   Culture   Final    NO GROWTH 5 DAYS Performed at [facility], [address] [place], [place] [address]    Report Status 04/17/2018 FINAL  Final  Culture, respiratory  (NON-Expectorated)     Status: None   Collection Time: 04/12/18  8:11 AM  Result Value Ref Range Status   Specimen Description   Final    TRACHEAL ASPIRATE Performed at [facility], [address] [place], [place] [address]    Special Requests   Final    NONE Performed at [facility] [place], [address] [place], [place] [address]    Gram Stain   Final    FEW WBC PRESENT, PREDOMINANTLY PMN FEW GRAM POSITIVE COCCI Performed at [facility], [address] [place], [place] [address]    Culture FEW METHICILLIN RESISTANT STAPHYLOCOCCUS AUREUS  Final   Report Status 04/15/2018 FINAL  Final   Organism ID, Bacteria METHICILLIN RESISTANT STAPHYLOCOCCUS AUREUS  Final      Susceptibility   Methicillin resistant staphylococcus aureus - MIC*    CIPROFLOXACIN >=8 RESISTANT Resistant     ERYTHROMYCIN >=8 RESISTANT Resistant     GENTAMICIN <=0.5 SENSITIVE Sensitive     OXACILLIN >=4 RESISTANT Resistant     TETRACYCLINE <=1 SENSITIVE Sensitive     VANCOMYCIN 1 SENSITIVE Sensitive     TRIMETH/SULFA <=10 SENSITIVE Sensitive     CLINDAMYCIN <=0.25 SENSITIVE Sensitive     RIFAMPIN <=0.5 SENSITIVE Sensitive     Inducible Clindamycin NEGATIVE Sensitive     * FEW METHICILLIN RESISTANT STAPHYLOCOCCUS AUREUS  C difficile quick scan w PCR reflex     Status: None   Collection Time: 04/12/18  3:05 PM  Result Value Ref Range Status   C Diff antigen NEGATIVE NEGATIVE Final   C Diff toxin NEGATIVE NEGATIVE Final   C Diff interpretation No C. difficile detected.  Final    Comment: Performed at [facility], [address] [place], [place] [address]  Culture, respiratory (NON-Expectorated)     Status: None   Collection Time: 04/18/18  9:37 AM  Result Value Ref Range Status   Specimen Description   Final    TRACHEAL ASPIRATE Performed at [facility], [address], [place], [place] [address]    Special Requests   Final    NONE Performed at [facility], [address] [place], [place] [address]    Gram Stain   Final    MODERATE WBC PRESENT, PREDOMINANTLY PMN FEW GRAM POSITIVE COCCI IN PAIRS IN CLUSTERS Performed at [facility], [address], [place], [place] [address]    Culture   Final    MODERATE METHICILLIN RESISTANT STAPHYLOCOCCUS AUREUS   Report Status 04/21/2018 FINAL  Final   Organism ID, Bacteria METHICILLIN RESISTANT STAPHYLOCOCCUS AUREUS  Final      Susceptibility   Methicillin resistant staphylococcus aureus - MIC*    CIPROFLOXACIN >=8 RESISTANT Resistant     ERYTHROMYCIN >=8 RESISTANT Resistant     GENTAMICIN <=0.5 SENSITIVE Sensitive     OXACILLIN >=4 RESISTANT Resistant     TETRACYCLINE <=1 SENSITIVE Sensitive     VANCOMYCIN 1 SENSITIVE Sensitive     TRIMETH/SULFA <=10 SENSITIVE Sensitive     CLINDAMYCIN >=8 RESISTANT Resistant     RIFAMPIN <=0.5 SENSITIVE Sensitive     Inducible Clindamycin NEGATIVE Sensitive     * MODERATE METHICILLIN RESISTANT STAPHYLOCOCCUS AUREUS  C difficile quick scan w PCR reflex     Status: None   Collection Time: 04/19/18 11:21 AM  Result Value Ref  Range Status   C Diff antigen NEGATIVE NEGATIVE Final   C Diff toxin NEGATIVE NEGATIVE Final   C Diff interpretation No C. difficile detected.  Final    Comment: Performed at [facility], [address] [place], [place] [address]  C difficile quick scan w PCR reflex     Status: None   Collection Time: 04/24/18  5:30 AM  Result Value Ref Range Status   C Diff antigen NEGATIVE NEGATIVE Final   C Diff toxin NEGATIVE NEGATIVE Final   C Diff interpretation No C. difficile detected.  Final    Comment: Performed at [facility], [address] [place], [place] [address]  CULTURE, BLOOD (ROUTINE X 2) w Reflex to ID Panel     Status: None   Collection Time: 04/27/18 10:49 AM  Result Value Ref Range Status   Specimen Description BLOOD BLOOD LEFT HAND  Final   Special Requests   Final    BOTTLES DRAWN  AEROBIC AND ANAEROBIC Blood Culture adequate volume   Culture   Final    NO GROWTH 5 DAYS Performed at [facility], [address] [place], [place] [address]    Report Status 05/02/2018 FINAL  Final  CULTURE, BLOOD (ROUTINE X 2) w Reflex to ID Panel     Status: None   Collection Time: 04/27/18 11:58 AM  Result Value Ref Range Status   Specimen Description BLOOD BLOOD LEFT HAND  Final   Special Requests   Final    BOTTLES DRAWN AEROBIC AND ANAEROBIC Blood Culture adequate volume   Culture   Final    NO GROWTH 5 DAYS Performed at [facility], [address] [place], [place] [address]    Report Status 05/02/2018 FINAL  Final  Culture, respiratory (NON-Expectorated)     Status: None   Collection Time: 04/27/18 12:00 PM  Result Value Ref Range Status   Specimen Description   Final    TRACHEAL ASPIRATE Performed at [facility], [address] [place], [place] [address]    Special Requests   Final    NONE Performed at [facility], [address] [place], [place] [address]    Gram Stain   Final    ABUNDANT WBC PRESENT, PREDOMINANTLY PMN NO ORGANISMS SEEN Performed at [facility], [address], [place], [place] [address]    Culture FEW METHICILLIN RESISTANT STAPHYLOCOCCUS AUREUS  Final   Report Status 04/30/2018 FINAL  Final   Organism ID, Bacteria METHICILLIN RESISTANT STAPHYLOCOCCUS AUREUS  Final      Susceptibility   Methicillin resistant staphylococcus aureus - MIC*    CIPROFLOXACIN >=8 RESISTANT Resistant     ERYTHROMYCIN >=8 RESISTANT Resistant     GENTAMICIN <=0.5 SENSITIVE Sensitive     OXACILLIN >=4 RESISTANT Resistant     TETRACYCLINE <=1 SENSITIVE Sensitive     VANCOMYCIN 1 SENSITIVE Sensitive     TRIMETH/SULFA <=10 SENSITIVE Sensitive     CLINDAMYCIN <=0.25 SENSITIVE Sensitive     RIFAMPIN <=0.5 SENSITIVE Sensitive     Inducible Clindamycin NEGATIVE Sensitive     * FEW METHICILLIN RESISTANT STAPHYLOCOCCUS  AUREUS  Urine Culture     Status: None   Collection Time: 04/27/18  5:27 PM  Result Value Ref Range Status   Specimen Description   Final    URINE, CATHETERIZED Performed at [facility], [address], [place], [place] [address]    Special Requests   Final    NONE Performed  at [facility], [address], [place], [place] [address]    Culture   Final    NO GROWTH Performed at [facility], [address] [place], [place] [address]    Report Status 04/29/2018 FINAL  Final    Studies: Ct Abdomen Wo Contrast  Result Date: 05/01/2018 CLINICAL DATA:  47 y/o  M; evaluate for PEG tube placement. EXAM: CT ABDOMEN WITHOUT CONTRAST TECHNIQUE: Multidetector CT imaging of the abdomen was performed following the standard protocol without IV contrast. COMPARISON:  None. FINDINGS: Lower chest: Cardiomegaly, platelike atelectasis in the lung bases, mitral annular calcification. Hepatobiliary: No focal liver abnormality is seen. No gallstones, gallbladder wall thickening, or biliary dilatation. Pancreas: Unremarkable. No pancreatic ductal dilatation or surrounding inflammatory changes. Spleen: Normal in size without focal abnormality. Adrenals/Urinary Tract: Adrenal glands are unremarkable. Kidneys are normal, without renal calculi, focal lesion, or hydronephrosis. Stomach/Bowel: Stomach is within normal limits. Appendix appears normal. No evidence of bowel wall thickening, distention, or inflammatory changes. The left lobe of the liver extends inferiorly overlapping the majority of the gastric body. There is no colon super position over the stomach at this time. There is a very small area of uncovered stomach in the left anterior axillary line below the ribcage (series 2, image 32). Vascular/Lymphatic: No significant vascular findings are present. No enlarged abdominal or pelvic lymph nodes. Other: No abdominal wall hernia or abnormality. Musculoskeletal: No acute or significant  osseous findings. IMPRESSION: The left lobe of the liver extends inferiorly overlapping the majority of the gastric body. There is no colon superficial to the stomach at this time. There is a very small area of uncovered stomach in the left anterior axillary line below the ribcage. Electronically Signed   By: Mitzi Hansen M.D.   On: 05/01/2018 23:14   Dg Abd 1 View  Result Date: 05/04/2018 CLINICAL DATA:  Preop for gastrostomy tube placement EXAM: ABDOMEN - 1 VIEW COMPARISON:  Abdominal CT from 3 days ago FINDINGS: Nasogastric tube tip over the stomach. Normal bowel gas pattern. Low volume chest with streaky opacities. IMPRESSION: Nasogastric tube with tip over the stomach. Limited abdominal coverage with no dilated bowel seen. Electronically Signed   By: Marnee Spring M.D.   On: 05/04/2018 12:27   Dg Abd 1 View  Result Date: 04/29/2018 CLINICAL DATA:  47 y/o  M; enteric tube placement. EXAM: ABDOMEN - 1 VIEW COMPARISON:  04/25/2018 abdomen radiograph. FINDINGS: Enteric tube tip projects over the gastric body. Post median sternotomy with broken lower wire. Left upper abdomen bowel gases travel. IMPRESSION: Enteric tube tip projects over gastric body. Electronically Signed   By: Mitzi Hansen M.D.   On: 04/29/2018 16:28   Dg Abd 1 View  Result Date: 04/25/2018 CLINICAL DATA:  Check gastric catheter placement EXAM: ABDOMEN - 1 VIEW COMPARISON:  04/18/2018 FINDINGS: Gastric catheter is noted extending into the stomach. Nonobstructive bowel gas pattern is seen. No other focal abnormality is noted. IMPRESSION: Nasogastric catheter within the stomach. Electronically Signed   By: Alcide Clever M.D.   On: 04/25/2018 09:13   Dg Abd 1 View  Result Date: 04/18/2018 CLINICAL DATA:  Orogastric tube placement EXAM: ABDOMEN - 1 VIEW COMPARISON:  None. FINDINGS: Orogastric tube tip is slightly below the gastroesophageal junction in the proximal gastric cardia. The side port is felt to be  above the diaphragm. There is a relative paucity of gas in the visualized abdominal region. No obstruction or free air evident. IMPRESSION: Orogastric tube tip  just below the gastroesophageal junction. Advise advancing orogastric tube approximately 8-10 cm to ensure that side-port as well as tube tip are in the stomach. Relative paucity of gas present. This finding may be seen normally but also may be indicative of early ileus or enteritis. Electronically Signed   By: Bretta BangWilliam  Woodruff III M.D.   On: 04/18/2018 11:40   Ct Chest Wo Contrast  Result Date: 04/18/2018 CLINICAL DATA:  Inpatient. Acute respiratory illness. Desaturation. Intubated. EXAM: CT CHEST WITHOUT CONTRAST TECHNIQUE: Multidetector CT imaging of the chest was performed following the standard protocol without IV contrast. COMPARISON:  Chest radiograph from earlier today. FINDINGS: Examination is significantly limited by patient body habitus and by streak artifact from the patient's upper extremities. Cardiovascular: Mild cardiomegaly. No significant pericardial effusion/thickening. Aortic valve prosthesis is in place. Coronary atherosclerosis. Atherosclerotic thoracic aorta with ectatic 4.3 cm ascending thoracic aorta. Dilated main pulmonary artery (3.9 cm diameter). Mediastinum/Nodes: No discrete thyroid nodules. Enteric tube terminates in the proximal stomach. No pathologically enlarged axillary, mediastinal or hilar lymph nodes, noting limited sensitivity for the detection of hilar adenopathy on this noncontrast study. Lungs/Pleura: No pneumothorax. No convincing pleural effusions. Endotracheal tube tip is 8.7 cm above the carina. Complete right lower lobe consolidation with scattered air bronchograms and some volume loss. Segmental right middle lobe and medial left lower lobe atelectasis. Patchy ground-glass opacities in the upper lobes bilaterally. Patchy nodular foci of consolidation in the right upper lobe measuring up to 9 mm (series  3/image 39). Upper abdomen: Diffuse hepatic steatosis. Musculoskeletal: No aggressive appearing focal osseous lesions. Moderate symmetric gynecomastia. Discontinuity in the lower most sternotomy wire. Marked thoracic spondylosis. IMPRESSION: 1. Limited scan. Complete right lower lobe consolidation with scattered air bronchograms and some volume loss. Patchy ground-glass opacities in the bilateral upper lobes. Patchy nodular foci of consolidation in the right upper lobe. Multilobar pneumonia is suspected, with superimposed atelectasis in the right greater than left lungs. 2.  Endotracheal tube tip is 8.7 cm above the carina. 3. Mild cardiomegaly. Dilated main pulmonary artery, suggesting pulmonary arterial hypertension. 4. Ectatic 4.3 cm ascending thoracic aorta. Recommend annual imaging followup by CTA or MRA. This recommendation follows 2010 ACCF/AHA/AATS/ACR/ASA/SCA/SCAI/SIR/STS/SVM Guidelines for the Diagnosis and Management of Patients with Thoracic Aortic Disease. Circulation. 2010; 121: [id number]. 5. Diffuse hepatic steatosis. 6. Moderate symmetric gynecomastia. Aortic Atherosclerosis (ICD10-I70.0). Electronically Signed   By: Delbert PhenixJason A Poff M.D.   On: 04/18/2018 10:56   Ir Gastrostomy Tube Mod Sed  Result Date: 05/04/2018 INDICATION: 47 year old with multiple medical problems including history of acute cardiopulmonary arrest. Patient has a tracheostomy tube and request for gastrostomy tube. GI was unable to safely place a gastrostomy tube with endoscopy. Patient had an abdominal CT that suggested there may be a safe percutaneous window for gastrostomy tube placement. Informed consent was obtained from the patient's wife. EXAM: FLUOROSCOPY TIME FOR GASTROSTOMY TUBE EVALUATION Physician: Rachelle HoraAdam R. Henn, MD MEDICATIONS: None ANESTHESIA/SEDATION: None FLUOROSCOPY TIME:  Fluoroscopy Time: 3 minutes and 48 seconds, 650 mGy COMPLICATIONS: None immediate. PROCEDURE: The procedure was explained to the patient. The  risks and benefits of the procedure were discussed and the patient' s white questions were addressed. Informed consent was obtained from the patient's wife. Patient was placed supine on the interventional table. The stomach was insufflated with gas through the nasogastric tube. The colon is gas-filled and well visualized. However, there appeared to be redundant loops of colon in the splenic flexure that were projecting over the stomach. Due to patient's morbid obesity, it  was difficult to get a true lateral view to assess the exact position of the colon. Based on the fluoroscopic evaluation, a safe percutaneous window could not be identified. Therefore, gastrostomy tube was not attempted. IMPRESSION: Percutaneous gastrostomy tube was not attempted because a safe percutaneous window was not identified. Consider surgical consultation. Electronically Signed   By: Richarda Overlie M.D.   On: 05/04/2018 15:40   US Venous Img Lower Bilateral  Result Date: 04/28/2018 CLINICAL DATA:  Fever, lower extremity edema EXAM: BILATERAL LOWER EXTREMITY VENOUS DOPPLER ULTRASOUND TECHNIQUE: Gray-scale sonography with graded compression, as well as color Doppler and duplex ultrasound were performed to evaluate the lower extremity deep venous systems from the level of the common femoral vein and including the common femoral, femoral, profunda femoral, popliteal and calf veins including the posterior tibial, peroneal and gastrocnemius veins when visible. The superficial great saphenous vein was also interrogated. Spectral Doppler was utilized to evaluate flow at rest and with distal augmentation maneuvers in the common femoral, femoral and popliteal veins. COMPARISON:  None. FINDINGS: Limited because of body habitus. RIGHT LOWER EXTREMITY Common Femoral Vein: No evidence of thrombus. Normal compressibility, respiratory phasicity and response to augmentation. Saphenofemoral Junction: No evidence of thrombus. Normal compressibility and flow  on color Doppler imaging. Profunda Femoral Vein: No evidence of thrombus. Normal compressibility and flow on color Doppler imaging. Femoral Vein: No evidence of thrombus. Normal compressibility, respiratory phasicity and response to augmentation. Popliteal Vein: No evidence of thrombus. Normal compressibility, respiratory phasicity and response to augmentation. Calf Veins: Limited visualization.  No gross occlusive thrombus. Superficial Great Saphenous Vein: No evidence of thrombus. Normal compressibility. Venous Reflux:  None. Other Findings:  None. LEFT LOWER EXTREMITY Common Femoral Vein: No evidence of thrombus. Normal compressibility, respiratory phasicity and response to augmentation. Saphenofemoral Junction: No evidence of thrombus. Normal compressibility and flow on color Doppler imaging. Profunda Femoral Vein: No evidence of thrombus. Normal compressibility and flow on color Doppler imaging. Femoral Vein: No evidence of thrombus. Normal compressibility, respiratory phasicity and response to augmentation. Popliteal Vein: No evidence of thrombus. Normal compressibility, respiratory phasicity and response to augmentation. Calf Veins: Limited exam. However, the left tibial veins appear patent. But the left peroneal veins are noncompressible with hypoechoic thrombus appearing occlusive. No propagation into the popliteal or femoral veins. Superficial Great Saphenous Vein: No evidence of thrombus. Normal compressibility. Venous Reflux:  None. Other Findings:  None. IMPRESSION: Limited exam because of body habitus. Positive exam for left calf peroneal DVT without propagation into the femoral or popliteal veins. Very low thrombus burden. No significant right lower extremity DVT. Electronically Signed   By: Judie Petit.  Shick M.D.   On: 04/28/2018 16:02   Dg Chest Port 1 View  Result Date: 05/06/2018 CLINICAL DATA:  Acute respiratory failure. EXAM: PORTABLE CHEST 1 VIEW COMPARISON:  04/28/2018. FINDINGS: The tracheostomy  tube remains in satisfactory position. Nasogastric tube in place, not clearly visible distally due to overlying density. Right PICC tip in the superior vena cava 4 cm above the superior cavoatrial junction. 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 No significant change in linear density in both lower lung zones and prominent pulmonary vasculature. Unremarkable bones. IMPRESSION: Stable cardiomegaly, pulmonary vascular congestion and bibasilar atelectasis. Electronically Signed   By: Beckie Salts M.D.   On: 05/06/2018 08:13   Dg Chest Port 1 View  Result Date: 04/28/2018 CLINICAL DATA:  Respiratory failure EXAM: PORTABLE CHEST 1 VIEW COMPARISON:  April 27, 2018 FINDINGS: Tracheostomy catheter tip is 5.1 cm  above the carina. Nasogastric tube tip and side port are below the diaphragm. No pneumothorax. There is bibasilar atelectatic change. Lungs elsewhere clear. Heart is mildly enlarged with pulmonary vascularity normal. Patient is status post median sternotomy. No evident bone lesions. No appreciable adenopathy. IMPRESSION: Tube positions as described without pneumothorax. Bibasilar atelectasis. Stable cardiac prominence. Electronically Signed   By: Bretta BangWilliam  Woodruff III M.D.   On: 04/28/2018 13:55   Dg Chest Port 1 View  Result Date: 04/27/2018 CLINICAL DATA:  Respiratory failure EXAM: PORTABLE CHEST 1 VIEW COMPARISON:  Two days ago FINDINGS: Tracheostomy tube in good position. An orogastric tube reaches the stomach. There is artifact from EKG leads. Right upper extremity PICC with tip at the upper cavoatrial junction. Very low lung volumes with diffuse hazy chest opacity. Asymmetric parenchymal opacity on the left. Cardiac enlargement. Prior median sternotomy. No evidence of pneumothorax. IMPRESSION: 1. Stable hardware positioning. 2. Low volume chest with bilateral atelectasis or pneumonia and possible layering effusions. No convincing change from 2 days ago. Electronically Signed   By: Marnee SpringJonathon   Watts M.D.   On: 04/27/2018 07:36   Dg Chest Port 1 View  Result Date: 04/25/2018 CLINICAL DATA:  Check tracheostomy placement EXAM: PORTABLE CHEST 1 VIEW COMPARISON:  04/24/2018 FINDINGS: Tracheostomy tube is noted in satisfactory position. Nasogastric catheter is noted coursing towards the stomach. The overall inspiratory effort is poor but relatively stable from the previous exam. Patchy changes are again identified in both lungs. Right-sided PICC line is again noted and stable. IMPRESSION: Tubes and lines as described above. Patchy infiltrative changes bilaterally. Electronically Signed   By: Alcide CleverMark  Lukens M.D.   On: 04/25/2018 09:13   Dg Chest Port 1 View  Result Date: 04/24/2018 CLINICAL DATA:  Acute respiratory failure EXAM: PORTABLE CHEST 1 VIEW COMPARISON:  Yesterday FINDINGS: Stable cardiomegaly.  Status post aortic valve replacement. Endotracheal tube tip is at the clavicular heads. An orogastric tube at least reaches the diaphragm, difficult to visualize due to body habitus. Right upper extremity PICC with tip at the upper cavoatrial junction. Low volume chest with hazy and streaky opacities. IMPRESSION: 1. Unchanged positioning of visible hardware. 2. Cardiomegaly, vascular congestion, and low volumes with atelectasis. Electronically Signed   By: Marnee SpringJonathon  Watts M.D.   On: 04/24/2018 07:18   Dg Chest Port 1 View  Result Date: 04/23/2018 CLINICAL DATA:  Patient status post respiratory arrest 04/07/2018. EXAM: PORTABLE CHEST 1 VIEW COMPARISON:  Single-view of the chest 04/21/2018 and 04/19/2018. FINDINGS: Endotracheal tube remains in place in good position. There is cardiomegaly and pulmonary edema. No pneumothorax. Likely small to moderate bilateral pleural effusions. IMPRESSION: No change in cardiomegaly and pulmonary edema since the most recent exam. Electronically Signed   By: Drusilla Kannerhomas  Dalessio M.D.   On: 04/23/2018 10:22   Dg Chest Port 1 View  Result Date: 04/21/2018 CLINICAL DATA:   Respiratory failure EXAM: PORTABLE CHEST 1 VIEW COMPARISON:  04/19/2018 FINDINGS: Cardiac shadow is enlarged but stable. Endotracheal tube and nasogastric catheter are noted and stable. The overall inspiratory effort is again poor. Mild right basilar infiltrative density is seen. Similar changes are noted in the left lung base as well. Mild central vascular congestion is noted. The right-sided pleural effusion is less well visualized likely related to patient positioning. IMPRESSION: Vascular congestion and bibasilar atelectatic changes. Electronically Signed   By: Alcide CleverMark  Lukens M.D.   On: 04/21/2018 06:38   Dg Chest Port 1 View  Result Date: 04/19/2018 CLINICAL DATA:  Hypoxia EXAM: PORTABLE CHEST  1 VIEW COMPARISON:  April 18, 2018 FINDINGS: Endotracheal tube tip is 4.0 cm above the carina. Nasogastric tube tip and side port are below the diaphragm. No pneumothorax. There is a right pleural effusion. There is patchy atelectatic change in both lower lobes with consolidation in the right base region. A lesser degree of consolidation is present in the medial left base. Heart is enlarged with pulmonary vascularity currently within normal limits. Patient is status post median sternotomy. No adenopathy. No bone lesions. IMPRESSION: Tube and catheter positions as described without pneumothorax. Persistent right pleural effusion with bibasilar consolidation, more on the right than on the left. There is also bibasilar atelectatic change. No new opacity evident. Electronically Signed   By: Bretta Bang III M.D.   On: 04/19/2018 07:32   Dg Chest Port 1 View  Result Date: 04/18/2018 CLINICAL DATA:  Hypoxia EXAM: PORTABLE CHEST 1 VIEW COMPARISON:  Chest CT April 18, 2018 FINDINGS: Endotracheal tube tip is 4.8 cm above the carina. Orogastric tube tip is below the diaphragm and not seen on this study. No pneumothorax. There is a small right pleural effusion with patchy consolidation in both lower lobes, more on the right  than on the left. The lungs elsewhere are clear. 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 No adenopathy evident. Patient is status post median sternotomy. No bone lesions. IMPRESSION: Tube positions as described without evident pneumothorax. Right pleural effusion with bibasilar consolidation, more on the left than on the right, stable. Underlying pulmonary vascular congestion. Electronically Signed   By: Bretta Bang III M.D.   On: 04/18/2018 11:39   Dg Chest Port 1 View  Result Date: 04/18/2018 CLINICAL DATA:  Acute respiratory distress. EXAM: PORTABLE CHEST 1 VIEW COMPARISON:  16 2019 FINDINGS: Endotracheal tube tip is 6 cm above the carina. Nasogastric tube enters the abdomen. Right subclavian central line tip at the SVC RA junction. Support artifact overlies the chest. Left lung is largely clear, possibly with mild basilar atelectasis. There is a right effusion more pronounced atelectasis in the right lower. IMPRESSION: Overlying artifact. Atelectasis right worse than left. Right effusion. Electronically Signed   By: Paulina Fusi M.D.   On: 04/18/2018 07:47   Dg Chest Port 1 View  Result Date: 04/17/2018 CLINICAL DATA:  PICC line placement EXAM: PORTABLE CHEST 1 VIEW COMPARISON:  Chest radiograph 04/17/2018 FINDINGS: Right-sided PICC line tip is in the mid SVC. There is a right IJ approach central venous catheter with its tip at the cavoatrial junction, unchanged. Endotracheal tube and gastric tube are unchanged. Lung the mediastinum are unchanged. IMPRESSION: Unchanged examination with right-sided PICC line tip in the mid SVC. Electronically Signed   By: Deatra Robinson M.D.   On: 04/17/2018 17:43   Dg Chest Port 1 View  Result Date: 04/17/2018 CLINICAL DATA:  47 year old male with a history of PICC placement EXAM: PORTABLE CHEST 1 VIEW COMPARISON:  04/17/2018, 04/14/2018 FINDINGS: Study is limited by underpenetration. Redemonstration of surgical changes of median sternotomy.  Right IJ central venous catheter is unchanged, with the tip appearing to terminate superior vena cava. Endotracheal tube terminates 4.2 cm above the carina. Gastric tube terminates out of the field of view. Lung volumes remain low with patchy airspace opacities bilaterally. No evidence of left or upper extremity PICC. IMPRESSION: Low lung volumes persist with unchanged appearance of patchy airspace opacities bilaterally. No PICC is visualized. Unchanged position of right IJ central venous catheter, endotracheal tube, gastric tube. Electronically Signed   By: Gilmer Mor  D.O.   On: 04/17/2018 15:53   Dg Chest Port 1 View  Result Date: 04/17/2018 CLINICAL DATA:  Increased acute respiratory failure, history of atrial fibrillation and diabetes EXAM: PORTABLE CHEST 1 VIEW COMPARISON:  Portable chest x-ray of 04/14/2018 FINDINGS: The tip of the endotracheal tube is approximately 5.2 cm above the carina. Opacity remains at the right lung base most consistent with atelectasis, but there is more opacity now present at the left lung base. Considerations are that of atelectasis versus pneumonia. No definite effusion is seen. Cardiomegaly is stable. IMPRESSION: 1. Tip of endotracheal tube 5.2 cm above the carina. 2. Diminished aeration with increasing basilar opacities most consistent with atelectasis. Pneumonia cannot be excluded. 3. Stable cardiomegaly. Electronically Signed   By: Dwyane Dee M.D.   On: 04/17/2018 09:10   Dg Chest Port 1 View  Result Date: 04/14/2018 CLINICAL DATA:  Hypoxia EXAM: PORTABLE CHEST 1 VIEW COMPARISON:  April 12, 2018 FINDINGS: Endotracheal tube tip is 2.0 cm above the carina. Nasogastric tube tip and side port are below the diaphragm. Central catheter tip is at the cavoatrial junction. No evident pneumothorax. There is focal consolidation in the right base. Lungs elsewhere appear clear. Note that there is overlying artifact from the cooling mat. Heart is mildly enlarged with pulmonary  vascularity normal. No bone lesions. No adenopathy evident. IMPRESSION: Tube and catheter positions as described without pneumothorax. Consolidation right base, felt to represent focal pneumonia. No similar changes elsewhere. Stable cardiac prominence. Electronically Signed   By: Bretta Bang III M.D.   On: 04/14/2018 09:16   Dg Chest Port 1 View  Result Date: 04/12/2018 CLINICAL DATA:  Dyspnea. Endotracheal tube present. Status post cardiac arrest. EXAM: PORTABLE CHEST 1 VIEW COMPARISON:  04/10/2018 FINDINGS: Endotracheal tube, right jugular central venous catheter, and nasogastric tube remain in appropriate position. Heart size is stable. Low lung volumes are seen. Atelectasis again seen in both lung bases. No new or worsening areas of pulmonary opacity are seen. No evidence of pulmonary consolidation or pneumothorax. IMPRESSION: No significant change in low lung volumes and bibasilar atelectasis. Electronically Signed   By: Myles Rosenthal M.D.   On: 04/12/2018 10:07   Dg Abd Portable 1v  Result Date: 04/16/2018 CLINICAL DATA:  Orogastric tube placement EXAM: PORTABLE ABDOMEN - 1 VIEW COMPARISON:  April 10, 2018 FINDINGS: Orogastric tube tip is at the gastroesophageal junction. There is a paucity of gas. No free air evident. IMPRESSION: Orogastric tube tip at gastroesophageal junction. Advise advancing tube approximately 10 cm to insure tip and side port in stomach. Paucity of gas is noted. While this finding may be seen normally, it may also be indicative ileus or enteritis. Bowel obstruction not felt to be likely. Visualized lung bases clear. Electronically Signed   By: Bretta Bang III M.D.   On: 04/16/2018 10:54   [place] Ekg Site Rite  Result Date: 04/17/2018 If Site Rite image not attached, placement could not be confirmed due to current cardiac rhythm.   Consults: Treatment Team:  Lamar Blinks, MD Uvaldo Rising, MD Anson Fret, MD Thana Farr, MD Vernie Murders, MD    Subjective:    Overnight Issues: over the last 24 hours patient has done quite well, presently on continuous trach collar not require mechanical ventilation. He is awake, alert, secretions have improved, working with PT and Speech  Objective:  Vital signs for last 24 hours: Temp:  [98.2 F (36.8 C)-98.5 F (36.9 C)] 98.2 F (36.8 C) (07/04 0400) Pulse Rate:  [  96-124] 98 (07/04 0600) Resp:  [5-20] 11 (07/04 0600) BP: (105-170)/(74-119) 105/77 (07/04 0600) SpO2:  [88 %-96 %] 91 % (07/04 0600) FiO2 (%):  [40 %-60 %] 60 % (07/04 0331) Weight:  [363 lb 15.7 oz (165.1 kg)] 363 lb 15.7 oz (165.1 kg) (07/04 0500)  Hemodynamic parameters for last 24 hours:    Intake/Output from previous day: 07/03 0701 - 07/04 0700 In: 2130 [I.V.:505; NG/GT:1625] Out: 1400 [Urine:1400]  Intake/Output this shift: No intake/output data recorded.  Vent settings for last 24 hours: FiO2 (%):  [40 %-60 %] 60 %  Physical Exam:  Vital signs stable, afebrile HEENT: Tracheostomy in place, minimal secretions Cardiovascular: Atrial fibrillation with controlled ventricular response Pulmonary: Clear to auscultationAbdominal: Positive bowel sounds, soft exam Extremity: 1+ edema noted Neurologic: Moves all extremities  Assessment/Plan:    Ventilator dependent respiratory failure. Status post tracheostomy. Doing well with continuous trach collar.  History of V. Tach cardiac arrest. Chronic atrial fibrillation with rapid ventricular response, status post mechanical aortic valve replacement. Will continue heparin infusion, patient is on metoprolol and diltiazem for rate control  Renal failure. Has been stable, being followed by nephrology. Hyperkalemia resolved  Left peroneal deep venous thrombosis, presently on systemic anticoagulation  Severe insulin resistance. On intensified sliding scale coverage  Presently receiving enteral nutrition. Was unable to place PEG tube secondary to patient's anatomy.  Nutrition is following  Speech pathology, continue physical therapy  Tora Kindred, DO  Brittany Osier 05/11/2018  *Care during the described time interval was provided by me and/or other providers on the critical care team.  I have reviewed this patient's available data, including medical history, events of note, physical examination and test results as part of my evaluation. Patient ID: Alexander Alexander, male   DOB: [birth year], 47 y.o.   MRN: [id number]

## 2018-05-11 NOTE — Progress Notes (Signed)
Patient ID: Alexander Alexander, male   DOB: 07/08/1971, 47 y.o.   MRN: [id number] Pulmonary/critical care attending  Patient with increased somnolence today. Arterial blood gas revealed hypercapnic failure. Chest x-ray revealed elevation of the right hemidiaphragm and atelectasis, will place back on mechanical ventilation  Tora KindredJohn Anatalia Alexander, D.O.

## 2018-05-12 LAB — BLOOD GAS, ARTERIAL
ACID-BASE EXCESS: 13.9 mmol/L — AB (ref 0.0–2.0)
BICARBONATE: 40.9 mmol/L — AB (ref 20.0–28.0)
FIO2: 0.5
O2 Saturation: 90.2 %
PEEP/CPAP: 5 cmH2O
PH ART: 7.42 (ref 7.350–7.450)
Patient temperature: 37
RATE: 22 resp/min
VT: 500 mL
pCO2 arterial: 63 mmHg — ABNORMAL HIGH (ref 32.0–48.0)
pO2, Arterial: 58 mmHg — ABNORMAL LOW (ref 83.0–108.0)

## 2018-05-12 LAB — BASIC METABOLIC PANEL
Anion gap: 9 (ref 5–15)
BUN: 86 mg/dL — AB (ref 6–20)
CHLORIDE: 90 mmol/L — AB (ref 98–111)
CO2: 36 mmol/L — ABNORMAL HIGH (ref 22–32)
Calcium: 9.7 mg/dL (ref 8.9–10.3)
Creatinine, Ser: 3.31 mg/dL — ABNORMAL HIGH (ref 0.61–1.24)
GFR, EST AFRICAN AMERICAN: 24 mL/min — AB (ref >60)
GFR, EST NON AFRICAN AMERICAN: 20 mL/min — AB (ref >60)
Glucose, Bld: 49 mg/dL — ABNORMAL LOW (ref 70–99)
POTASSIUM: 5 mmol/L (ref 3.5–5.1)
SODIUM: 135 mmol/L (ref 135–145)

## 2018-05-12 LAB — GLUCOSE, CAPILLARY
GLUCOSE-CAPILLARY: 42 mg/dL — AB (ref 70–99)
GLUCOSE-CAPILLARY: 50 mg/dL — AB (ref 70–99)
GLUCOSE-CAPILLARY: 59 mg/dL — AB (ref 70–99)
GLUCOSE-CAPILLARY: 62 mg/dL — AB (ref 70–99)
GLUCOSE-CAPILLARY: 68 mg/dL — AB (ref 70–99)
GLUCOSE-CAPILLARY: 73 mg/dL (ref 70–99)
GLUCOSE-CAPILLARY: 87 mg/dL (ref 70–99)
Glucose-Capillary: 98 mg/dL (ref 70–99)
Glucose-Capillary: 76 mg/dL (ref 70–99)
Glucose-Capillary: 51 mg/dL — ABNORMAL LOW (ref 70–99)
Glucose-Capillary: 73 mg/dL (ref 70–99)

## 2018-05-12 LAB — CBC
HCT: 35.2 % — ABNORMAL LOW (ref 40.0–52.0)
Hemoglobin: 11.4 g/dL — ABNORMAL LOW (ref 13.0–18.0)
MCH: 26.9 pg (ref 26.0–34.0)
MCHC: 32.5 g/dL (ref 32.0–36.0)
MCV: 83 fL (ref 80.0–100.0)
PLATELETS: 263 10*3/uL (ref 150–440)
RBC: 4.24 MIL/uL — ABNORMAL LOW (ref 4.40–5.90)
RDW: 18.6 % — AB (ref 11.5–14.5)
WBC: 7.4 10*3/uL (ref 3.8–10.6)

## 2018-05-12 LAB — HEPARIN LEVEL (UNFRACTIONATED)
HEPARIN UNFRACTIONATED: 0.22 [IU]/mL — AB (ref 0.30–0.70)
HEPARIN UNFRACTIONATED: 0.46 [IU]/mL (ref 0.30–0.70)
Heparin Unfractionated: 0.44 IU/mL (ref 0.30–0.70)

## 2018-05-12 MED ORDER — HEPARIN BOLUS VIA INFUSION
1650.0000 [IU] | Freq: Once | INTRAVENOUS | Status: AC
  Administered 2018-05-12: 1650 [IU] via INTRAVENOUS
  Filled 2018-05-12: qty 1650

## 2018-05-12 MED ORDER — DEXTROSE 5 % IV SOLN: INTRAVENOUS | Status: DC

## 2018-05-12 MED ORDER — DEXTROSE 50 % IV SOLN
INTRAVENOUS | Status: AC
  Administered 2018-05-12: 50 mL
  Filled 2018-05-12: qty 50

## 2018-05-12 MED ORDER — CYCLOBENZAPRINE HCL 10 MG PO TABS
5.0000 mg | ORAL_TABLET | Freq: Once | ORAL | Status: AC
  Administered 2018-05-12: 5 mg
  Filled 2018-05-12: qty 0.5

## 2018-05-12 MED ORDER — DEXTROSE 10 % IV SOLN
INTRAVENOUS | Status: DC
  Administered 2018-05-12 – 2018-05-13 (×2): via INTRAVENOUS

## 2018-05-12 MED ORDER — CLONAZEPAM 1 MG PO TABS
2.0000 mg | ORAL_TABLET | Freq: Every day | ORAL | Status: DC
  Administered 2018-05-12 – 2018-05-14 (×3): 2 mg
  Filled 2018-05-12 (×3): qty 2

## 2018-05-12 MED ORDER — ACETAMINOPHEN 325 MG PO TABS
650.0000 mg | ORAL_TABLET | Freq: Four times a day (QID) | ORAL | Status: DC | PRN
  Filled 2018-05-12: qty 2

## 2018-05-12 MED ORDER — INSULIN GLARGINE 100 UNIT/ML ~~LOC~~ SOLN
35.0000 [IU] | Freq: Two times a day (BID) | SUBCUTANEOUS | Status: DC
  Administered 2018-05-12: 35 [IU] via SUBCUTANEOUS
  Filled 2018-05-12 (×3): qty 0.35

## 2018-05-12 MED ORDER — DEXTROSE 5 % IV SOLN
INTRAVENOUS | Status: DC
Start: 2018-05-12 — End: 2018-05-12
  Administered 2018-05-12: 12:00:00 via INTRAVENOUS

## 2018-05-12 MED ORDER — AMLODIPINE BESYLATE 10 MG PO TABS
10.0000 mg | ORAL_TABLET | Freq: Every day | ORAL | Status: DC
  Administered 2018-05-13 – 2018-05-18 (×6): 10 mg
  Filled 2018-05-12 (×6): qty 1

## 2018-05-12 MED ORDER — GABAPENTIN 600 MG PO TABS
600.0000 mg | ORAL_TABLET | Freq: Three times a day (TID) | ORAL | Status: DC
  Administered 2018-05-12 – 2018-05-19 (×18): 600 mg
  Filled 2018-05-12 (×26): qty 1

## 2018-05-12 NOTE — Progress Notes (Signed)
[facility] [place]   PATIENT NAME: Alexander Alexander    MR#:  [id number]  DATE OF BIRTH:  07/21/1971  SUBJECTIVE:   Patient awake and following commands. Moves all 4 extremities On tube feeds  REVIEW OF SYSTEMS:  Review of Systems  Unable to perform ROS: Intubated    DRUG ALLERGIES:   Allergies  Allergen Reactions  . Lexapro [Escitalopram Oxalate] Swelling    VITALS:  Blood pressure (!) 125/94, pulse 100, temperature 99.3 F (37.4 C), temperature source Oral, resp. rate 18, height 5\' 10"  (1.778 m), weight (!) 165.1 kg (363 lb 15.7 oz), SpO2 92 %.  PHYSICAL EXAMINATION:  GENERAL:  47 y.o.-year-old patient lying in the bed with no acute distress. obese EYES: Pupils equal, round, reactive to light and accommodation. No scleral icterus. Extraocular muscles intact.  HEENT: Head atraumatic, normocephalic. Oropharynx and nasopharynx clear. Trach++ NECK:  Supple, no jugular venous distention. No thyroid enlargement, no tenderness.  LUNGS: Normal breath sounds bilaterally, no wheezing, rales,rhonchi or crepitation. No use of accessory muscles of respiration.  CARDIOVASCULAR: S1, S2 normal. No murmurs, rubs, or gallops.  ABDOMEN: Soft, nontender, nondistended. Bowel sounds present. No organomegaly or mass.  EXTREMITIES: ++ pedal edema, cyanosis, or clubbing.  NEUROLOGIC: moves extremities, no focal weakness PSYCHIATRIC: The patient is alert  SKIN: No obvious rash, lesion, or ulcer.   Physical Exam LABORATORY PANEL:   CBC Recent Labs  Lab 05/12/18 0320  WBC 7.4  HGB 11.4*  HCT 35.2*  PLT 263   ------------------------------------------------------------------------------------------------------------------  Chemistries  Recent Labs  Lab 05/06/18 0435  05/09/18 0534  05/12/18 0320  NA 136   < > 137   < > 135  K 5.3*   < > 5.1   < > 5.0  CL 100   < > 91*   < > 90*  CO2 29   < > 38*   < > 36*  GLUCOSE 171*   < > 142*   < > 49*  BUN 62*    < > 57*   < > 86*  CREATININE 2.20*   < > 2.28*   < > 3.31*  CALCIUM 9.0   < > 9.0   < > 9.7  MG 2.1  --   --   --   --   AST  --    < > 22  --   --   ALT  --    < > 21  --   --   ALKPHOS  --    < > 64  --   --   BILITOT  --    < > 0.9  --   --    < > = values in this interval not displayed.   ------------------------------------------------------------------------------------------------------------------  Cardiac Enzymes No results for input(s): TROPONINI in the last 168 hours. ------------------------------------------------------------------------------------------------------------------  RADIOLOGY:  Dg Chest Port 1 View  Result Date: 05/11/2018 CLINICAL DATA:  Aspiration EXAM: PORTABLE CHEST 1 VIEW COMPARISON:  04/05/2018 FINDINGS: Cardiomegaly. Prior median sternotomy and valve replacement. Tracheostomy and NG tube are unchanged. Low lung volumes with vascular congestion. Interstitial prominence likely reflects early interstitial edema. Bibasilar atelectasis. IMPRESSION: Cardiomegaly with vascular congestion and probable early interstitial edema. Low lung volumes with bibasilar atelectasis. Electronically Signed   By: Charlett NoseKevin  Dover M.D.   On: 05/11/2018 16:00   ASSESSMENT AND PLAN:  47 year old male with past medical history significant for A. fib status post ablation, aortic valve replacement on warfarin, history of  congestive heart failure, CKD, morbid obesity, diabetes and sleep apnea who was getting an outpatient MRI of his lower back had a cardiac arrest.  * Acute cardiopulmonary arrest Pulseless VT Difficult weaning from vent,s/p tracheostomy Continuemechanical ventilation with weaning as tolerated  PEG by  IR unsuccessful since safe window not found. On NG tube feeds  * AcuteMRSA/VAP Treated withcourse of Linezolid Continue trach collar/vent protocol   *Atrial fibrillationwith RVR Continuemetoprolol,Cardizem, amiodarone, continue Heparin drip.  * History  of mechanical aortic valve replacement Onheparin drip  * Acute toxic metabolic encephalopathy  Neurology input appreciated  Started on Kepprafor possible partial seizures. Later stopped.  * Chronic diabetes mellitus type 2 Stable on current regiment  * Acute renal failurew/ CKD stage III Secondary toATN  * Acute septic shock, MRSA pneumonia Resolved complete 14 days linezolid  Disposition to LTAC when able  All the records are reviewed and case discussed with Care Management/Social Workerr. Management plans discussed with the patient, family and they are in agreement.  CODE STATUS: Full  TOTAL TIME TAKING CARE OF THIS PATIENT: 25 minutes.   POSSIBLE D/C IN few DAYS, DEPENDING ON CLINICAL CONDITION.   Orie Fisherman M.D on 05/12/2018   Between 7am to 6pm - Pager - [phone]  After 6pm go to [email or web address] - password [email or web address]  [facility]  Office  [phone]  CC: Primary care physician; Erasmo Downer, MD  Note: This dictation was prepared with Dragon dictation along with smaller phrase technology. Any transcriptional errors that result from this process are unintentional.

## 2018-05-12 NOTE — Progress Notes (Addendum)
ANTICOAGULATION CONSULT NOTE   Pharmacy Consult for heparin drip management Indication: atrial fibrillation and aortic valve replacement   Allergies  Allergen Reactions  . Lexapro [Escitalopram Oxalate] Swelling    Patient Measurements: Height: 5\' 10"  (177.8 cm) Weight: (!) 363 lb 15.7 oz (165.1 kg) IBW/kg (Calculated) : 73 Heparin Dosing Weight: 121 kg (updated 6/21)  Vital Signs: Temp: 98.4 F (36.9 C) (07/05 0000) Temp Source: Axillary (07/05 0000) BP: 117/82 (07/05 0300) Pulse Rate: 113 (07/05 0300)  Labs: Recent Labs    05/09/18 0534  05/10/18 0745  05/11/18 1328 05/11/18 2040 05/12/18 0320  HGB 11.3*  --  12.2*  --   --   --  11.4*  HCT 34.4*  --  37.0*  --   --   --  35.2*  PLT 246  --  251  --   --   --  263  HEPARINUNFRC  --    < >  --    < > 0.94* 0.61 0.22*  CREATININE 2.28*  --  2.23*  --   --   --   --    < > = values in this interval not displayed.    Estimated Creatinine Clearance: 62.2 mL/min (A) (by C-G formula based on SCr of 2.23 mg/dL (H)).   Medical History: Past Medical History:  Diagnosis Date  . Allergy   . Anxiety   . Arrhythmia   . Arthritis   . Atrial fibrillation (HCC)   . CHF (congestive heart failure) (HCC)   . CKD (chronic kidney disease)   . Diabetes mellitus without complication (HCC)   . Diabetes mellitus, type II (HCC)   . Heart failure (HCC)   . Hyperlipidemia   . Hypertension   . Pancreatitis   . Sleep apnea     Medications:  Scheduled:  . amLODipine  10 mg Oral Daily  . budesonide (PULMICORT) nebulizer solution  0.25 mg Nebulization Q6H  . chlorhexidine  15 mL Mouth Rinse BID  . clonazePAM  1 mg Per Tube Daily  . clonazePAM  2 mg Oral QHS  . diltiazem  60 mg Oral Q6H  . famotidine  20 mg Per Tube Daily  . feeding supplement (PRO-STAT SUGAR FREE 64)  30 mL Per Tube Daily  . fentaNYL  150 mcg Transdermal Q72H  . fluvoxaMINE  25 mg Per Tube QHS  . free water  250 mL Per Tube Q6H  . heparin  1,650 Units  Intravenous Once  . insulin aspart  0-20 Units Subcutaneous Q4H  . insulin glargine  85 Units Subcutaneous BID  . ipratropium-albuterol  3 mL Nebulization Q6H  . mouth rinse  15 mL Mouth Rinse 10 times per day  . metoprolol tartrate  50 mg Per Tube BID  . sodium chloride flush  10-40 mL Intracatheter Q12H   Infusions:  . feeding supplement (NEPRO CARB STEADY) 1,000 mL (05/12/18 0438)  . heparin 1,500 Units/hr (05/11/18 2257)    Assessment: Pharmacy consulted for warfarin and heparin drip management for 47 yo male admitted to the ICU s/p cardiac arrest. Patient has history significant for aortic valve replacement, atrial fibrillation and CHF. Head CT is negative for infarction or hemorrhage.   Patient takes warfarin 5mg  daily as an outpatient for goal INR 2.5-3.5.    Goal of Therapy:  INR 2-3 Heparin level 0.3-0.7 units/ml Monitor platelets by anticoagulation protocol: Yes   Plan:  07/05 @ 0320 HL 0.22 subtherapeutic. Will rebolus w/ heparin 1650 units IV x 1  and increase rate to 1650 units/hr and will recheck anti-Xa @ 1300. CBC stable.  Thomasene Ripple, PharmD, BCPS Clinical Pharmacist 05/12/2018

## 2018-05-12 NOTE — Progress Notes (Addendum)
ANTICOAGULATION CONSULT NOTE   Pharmacy Consult for heparin drip management Indication: atrial fibrillation and aortic valve replacement   Allergies  Allergen Reactions  . Lexapro [Escitalopram Oxalate] Swelling    Patient Measurements: Height: 5\' 10"  (177.8 cm) Weight: (!) 363 lb 15.7 oz (165.1 kg) IBW/kg (Calculated) : 73 Heparin Dosing Weight: 121 kg (updated 6/21)  Vital Signs: Temp: 97.7 F (36.5 C) (07/05 2000) Temp Source: Axillary (07/05 2000) BP: 128/94 (07/05 2000) Pulse Rate: 115 (07/05 2000)  Labs: Recent Labs    05/10/18 0745  05/12/18 0320 05/12/18 1315 05/12/18 2015  HGB 12.2*  --  11.4*  --   --   HCT 37.0*  --  35.2*  --   --   PLT 251  --  263  --   --   HEPARINUNFRC  --    < > 0.22* 0.46 0.44  CREATININE 2.23*  --  3.31*  --   --    < > = values in this interval not displayed.    Estimated Creatinine Clearance: 41.9 mL/min (A) (by C-G formula based on SCr of 3.31 mg/dL (H)).   Medical History: Past Medical History:  Diagnosis Date  . Allergy   . Anxiety   . Arrhythmia   . Arthritis   . Atrial fibrillation (HCC)   . CHF (congestive heart failure) (HCC)   . CKD (chronic kidney disease)   . Diabetes mellitus without complication (HCC)   . Diabetes mellitus, type II (HCC)   . Heart failure (HCC)   . Hyperlipidemia   . Hypertension   . Pancreatitis   . Sleep apnea     Medications:  Scheduled:  . [START ON 05/13/2018] amLODipine  10 mg Per Tube Daily  . budesonide (PULMICORT) nebulizer solution  0.25 mg Nebulization Q6H  . chlorhexidine  15 mL Mouth Rinse BID  . clonazePAM  1 mg Per Tube Daily  . clonazePAM  2 mg Per Tube QHS  . diltiazem  60 mg Oral Q6H  . famotidine  20 mg Per Tube Daily  . feeding supplement (PRO-STAT SUGAR FREE 64)  30 mL Per Tube Daily  . fentaNYL  150 mcg Transdermal Q72H  . fluvoxaMINE  25 mg Per Tube QHS  . free water  250 mL Per Tube Q6H  . gabapentin  600 mg Per Tube TID  . insulin aspart  0-20 Units  Subcutaneous Q4H  . ipratropium-albuterol  3 mL Nebulization Q6H  . mouth rinse  15 mL Mouth Rinse 10 times per day  . metoprolol tartrate  50 mg Per Tube BID  . sodium chloride flush  10-40 mL Intracatheter Q12H   Infusions:  . dextrose 50 mL/hr at 05/12/18 2000  . feeding supplement (NEPRO CARB STEADY) 30 mL/hr at 05/12/18 2000  . heparin 1,650 Units/hr (05/12/18 2000)    Assessment: Pharmacy consulted for warfarin and heparin drip management for 47 yo male admitted to the ICU s/p cardiac arrest. Patient has history significant for aortic valve replacement, atrial fibrillation and CHF. Head CT is negative for infarction or hemorrhage.   Patient takes warfarin 5mg  daily as an outpatient for goal INR 2.5-3.5.   Goal of Therapy:  INR 2-3 Heparin level 0.3-0.7 units/ml Monitor platelets by anticoagulation protocol: Yes   Plan:  7/5 @ 2015 HL 0.44. Level now therapeutic x 2. Will continue current infusion rate of 1650 units/hr. Will continue to check HL and CBC with AM labs per protocol.    Per ICU rounds on 7/5,  patient not tolerating tube feeds at this time. Will consider initiating warfarin in next 48-72 hours.   Pharmacy will continue to monitor and adjust per consult.   Gardner CandleSheema M Owyn Raulston, PharmD, BCPS Clinical Pharmacist 05/12/2018 8:54 PM

## 2018-05-12 NOTE — Progress Notes (Signed)
[employer] [facility] [place] at [facility]   PATIENT NAME: Alexander Alexander    MR#:  [id number]  DATE OF BIRTH:  05-04-1971  SUBJECTIVE:  Late note. Patient seen and examined on 05/09/2018  On trach vent  REVIEW OF SYSTEMS:  Review of Systems  Unable to perform ROS: Intubated    DRUG ALLERGIES:   Allergies  Allergen Reactions  . Lexapro [Escitalopram Oxalate] Swelling    VITALS:  Blood pressure (!) 125/94, pulse 100, temperature 99.3 F (37.4 C), temperature source Oral, resp. rate 18, height 5\' 10"  (1.778 m), weight (!) 165.1 kg (363 lb 15.7 oz), SpO2 92 %.  PHYSICAL EXAMINATION:  GENERAL:  47 y.o.-year-old patient lying in the bed with no acute distress. obese EYES: Pupils equal, round, reactive to light and accommodation. No scleral icterus. Extraocular muscles intact.  HEENT: Head atraumatic, normocephalic. Oropharynx and nasopharynx clear. Trach++ NECK:  Supple, no jugular venous distention. No thyroid enlargement, no tenderness.  LUNGS: Normal breath sounds bilaterally, no wheezing, rales,rhonchi or crepitation. No use of accessory muscles of respiration.  CARDIOVASCULAR: S1, S2 normal. No murmurs, rubs, or gallops.  ABDOMEN: Soft, nontender, nondistended. Bowel sounds present. No organomegaly or mass.  EXTREMITIES: ++ pedal edema, cyanosis, or clubbing.  NEUROLOGIC: moves extremities, no focal weakness PSYCHIATRIC: The patient is alert  SKIN: No obvious rash, lesion, or ulcer.   Physical Exam LABORATORY PANEL:   CBC Recent Labs  Lab 05/12/18 0320  WBC 7.4  HGB 11.4*  HCT 35.2*  PLT 263   ------------------------------------------------------------------------------------------------------------------  Chemistries  Recent Labs  Lab 05/06/18 0435  05/09/18 0534  05/12/18 0320  NA 136   < > 137   < > 135  K 5.3*   < > 5.1   < > 5.0  CL 100   < > 91*   < > 90*  CO2 29   < > 38*   < > 36*  GLUCOSE 171*   < > 142*   < > 49*  BUN 62*   < > 57*   < >  86*  CREATININE 2.20*   < > 2.28*   < > 3.31*  CALCIUM 9.0   < > 9.0   < > 9.7  MG 2.1  --   --   --   --   AST  --    < > 22  --   --   ALT  --    < > 21  --   --   ALKPHOS  --    < > 64  --   --   BILITOT  --    < > 0.9  --   --    < > = values in this interval not displayed.   ------------------------------------------------------------------------------------------------------------------  Cardiac Enzymes No results for input(s): TROPONINI in the last 168 hours. ------------------------------------------------------------------------------------------------------------------  RADIOLOGY:  Dg Chest Port 1 View  Result Date: 05/11/2018 CLINICAL DATA:  Aspiration EXAM: PORTABLE CHEST 1 VIEW COMPARISON:  04/05/2018 FINDINGS: Cardiomegaly. Prior median sternotomy and valve replacement. Tracheostomy and NG tube are unchanged. Low lung volumes with vascular congestion. Interstitial prominence likely reflects early interstitial edema. Bibasilar atelectasis. IMPRESSION: Cardiomegaly with vascular congestion and probable early interstitial edema. Low lung volumes with bibasilar atelectasis. Electronically Signed   By: Charlett Nose M.D.   On: 05/11/2018 16:00   ASSESSMENT AND PLAN:  47 year old male with past medical history significant for A. fib status post ablation, aortic valve replacement on warfarin, history of congestive  heart failure, CKD, morbid obesity, diabetes and sleep apnea who was getting an outpatient MRI of his lower back had a cardiac arrest.  * Acute cardiopulmonary arrest Pulseless VT Difficult weaning from vent,s/p tracheostomy Continuemechanical ventilation with weaning as tolerated  PEG by  IR unsuccessful since safe window not found.  * AcuteMRSA/VAP Treated withcourse of Linezolid Continue trach collar/vent protocol   *Atrial fibrillationwith RVR Continuemetoprolol,Cardizem, amiodarone, continue Heparin drip.  * History of mechanical aortic valve  replacement Onheparin drip  * Acute toxic metabolic encephalopathy  Neurology input appreciated  Started on Kepprafor possible partial seizures. Later stopped.  * Chronic diabetes mellitus type 2 Stable on current regiment  * Acute renal failurew/ CKD stage III Secondary toATN  * Acute septic shock, MRSA pneumonia Resolved complete 14 days linezolid  Disposition to LTAC when able  All the records are reviewed and case discussed with Care Management/Social Workerr. Management plans discussed with the patient, family and they are in agreement.  CODE STATUS: Full  TOTAL TIME TAKING CARE OF THIS PATIENT: 25 minutes.   POSSIBLE D/C IN few DAYS, DEPENDING ON CLINICAL CONDITION.   Orie FishermanSrikar R Nyaira Hodgens M.D on 05/12/2018   Between 7am to 6pm - Pager - [phone]  After 6pm go to [email or web address] - password [email or web address]  [facility]  Office  [phone]  CC: Primary care physician; Erasmo DownerBacigalupo, Angela M, MD  Note: This dictation was prepared with Dragon dictation along with smaller phrase technology. Any transcriptional errors that result from this process are unintentional.

## 2018-05-12 NOTE — Progress Notes (Signed)
OT Cancellation Note  Patient Details Name: Alexander Alexander MRN: [id number] DOB: 09-Dec-1970   Cancelled Treatment:    Reason Eval/Treat Not Completed: Medical issues which prohibited therapy. Per chart review, pt continued with N/V last evening. Possible cuff leak overnight. Latest vitals (10:27am) BP 125/100, ECG  HR 119. Pt not appropriate for OT treatment at this time. Will continue to follow an re-attempt at later date/time as pt is medically appropriate.   Richrd PrimeJamie Stiller, MPH, MS, OTR/L ascom [phone] 05/12/18, 10:34 AM

## 2018-05-12 NOTE — Progress Notes (Signed)
0315  Pt found pulling at his trach and trach ties.  He was saying "help me."  Pt's cuff is completly deflated on assessment.  Respiratory at bedside - concern for leak.  [facility]'s [facility] and potential cuff leak.  Per her instruction, administered Fentanyl 25mcg IVP and 2200 Klonopin 2mg  via tube.

## 2018-05-12 NOTE — Progress Notes (Signed)
ANTICOAGULATION CONSULT NOTE   Pharmacy Consult for heparin drip management Indication: atrial fibrillation and aortic valve replacement   Allergies  Allergen Reactions  . Lexapro [Escitalopram Oxalate] Swelling    Patient Measurements: Height: 5\' 10"  (177.8 cm) Weight: (!) 363 lb 15.7 oz (165.1 kg) IBW/kg (Calculated) : 73 Heparin Dosing Weight: 121 kg (updated 6/21)  Vital Signs: Temp: 99.3 F (37.4 C) (07/05 1200) Temp Source: Oral (07/05 1200) BP: 125/94 (07/05 1400) Pulse Rate: 100 (07/05 1400)  Labs: Recent Labs    05/10/18 0745  05/11/18 2040 05/12/18 0320 05/12/18 1315  HGB 12.2*  --   --  11.4*  --   HCT 37.0*  --   --  35.2*  --   PLT 251  --   --  263  --   HEPARINUNFRC  --    < > 0.61 0.22* 0.46  CREATININE 2.23*  --   --  3.31*  --    < > = values in this interval not displayed.    Estimated Creatinine Clearance: 41.9 mL/min (A) (by C-G formula based on SCr of 3.31 mg/dL (H)).   Medical History: Past Medical History:  Diagnosis Date  . Allergy   . Anxiety   . Arrhythmia   . Arthritis   . Atrial fibrillation (HCC)   . CHF (congestive heart failure) (HCC)   . CKD (chronic kidney disease)   . Diabetes mellitus without complication (HCC)   . Diabetes mellitus, type II (HCC)   . Heart failure (HCC)   . Hyperlipidemia   . Hypertension   . Pancreatitis   . Sleep apnea     Medications:  Scheduled:  . [START ON 05/13/2018] amLODipine  10 mg Per Tube Daily  . budesonide (PULMICORT) nebulizer solution  0.25 mg Nebulization Q6H  . chlorhexidine  15 mL Mouth Rinse BID  . clonazePAM  1 mg Per Tube Daily  . clonazePAM  2 mg Per Tube QHS  . diltiazem  60 mg Oral Q6H  . famotidine  20 mg Per Tube Daily  . feeding supplement (PRO-STAT SUGAR FREE 64)  30 mL Per Tube Daily  . fentaNYL  150 mcg Transdermal Q72H  . fluvoxaMINE  25 mg Per Tube QHS  . free water  250 mL Per Tube Q6H  . gabapentin  600 mg Per Tube TID  . insulin aspart  0-20 Units  Subcutaneous Q4H  . insulin glargine  35 Units Subcutaneous BID  . ipratropium-albuterol  3 mL Nebulization Q6H  . mouth rinse  15 mL Mouth Rinse 10 times per day  . metoprolol tartrate  50 mg Per Tube BID  . sodium chloride flush  10-40 mL Intracatheter Q12H   Infusions:  . dextrose 50 mL/hr at 05/12/18 1214  . feeding supplement (NEPRO CARB STEADY) 30 mL/hr at 05/12/18 [id number]  . heparin 1,650 Units/hr (05/12/18 1357)    Assessment: Pharmacy consulted for warfarin and heparin drip management for 47 yo male admitted to the ICU s/p cardiac arrest. Patient has history significant for aortic valve replacement, atrial fibrillation and CHF. Head CT is negative for infarction or hemorrhage.   Patient takes warfarin 5mg  daily as an outpatient for goal INR 2.5-3.5.    Goal of Therapy:  INR 2-3 Heparin level 0.3-0.7 units/ml Monitor platelets by anticoagulation protocol: Yes   Plan:  Continue heparin at 1650 units/hr. Will obtain confirmatory anti-Xa level with at 1900.   Per ICU rounds on 7/5, patient not tolerating tube feeds at this time.  Will consider initiating warfarin in next 48-72 hours.   Pharmacy will continue to monitor and adjust per consult.   MLS 05/12/2018

## 2018-05-12 NOTE — Progress Notes (Signed)
CRITICAL CARE NOTE  CC  follow up respiratory failure  SUBJECTIVE On vent +episodic vomiting Restarted TF's Low fsbs      SIGNIFICANT EVENTS    BP (!) 125/100   Pulse (!) 118   Temp 99.1 F (37.3 C) (Oral)   Resp (!) 22   Ht _0  (1.778 m)   Wt (!) 363 lb 15.7 oz (165.1 kg)   SpO2 95%   BMI 52.23 kg/m    REVIEW OF SYSTEMS Alert, awake, distressed from pain  PHYSICAL EXAMINATION:  GENERAL:agitated HEAD: Normocephalic, atraumatic.  EYES: Pupils equal, round, reactive to light.  No scleral icterus.  MOUTH: Moist mucosal membrane. NECK: Supple. No thyromegaly. No nodules. No JVD.  PULMONARY: +rhonchi CARDIOVASCULAR: S1 and S2. Regular rate and rhythm. No murmurs, rubs, or gallops.  GASTROINTESTINAL: Soft, nontender, -distended. No masses. Positive bowel sounds. No hepatosplenomegaly.  MUSCULOSKELETAL: + edema.  NEUROLOGIC: agitated SKIN:intact,warm,dry  ASSESSMENT AND PLAN Ventilator dependent respiratory failure. Status post tracheostomy.continuous trach collar as tolerated  History of V. Tach cardiac arrest. Chronic atrial fibrillation with rapid ventricular response, status post mechanical aortic valve replacement. Will continue heparin infusion, patient is on metoprolol and diltiazem for rate control  Renal failure. Has been stable, being followed by nephrology. Hyperkalemia resolved  Left peroneal deep venous thrombosis, presently on systemic anticoagulation  Severe insulin resistance. On intensified sliding scale coverage  Presently receiving enteral nutrition. Was unable to place PEG tube secondary to patient's anatomy. Nutrition is following  Speech pathology, continue physical therapy   -continue Full MV support -continue Bronchodilator Therapy -Wean Fio2 and PEEP as tolerated -will perform SAT/SBt when respiratory parameters are met   Renal Failure-most likely due to ATN -follow chem 7 -follow UO -continue Foley Catheter-assess  need   CARDIAC ICU monitoring  ID -continue IV abx as prescibed -follow up cultures   DVT/GI PRX ordered TRANSFUSIONS AS NEEDED MONITOR FSBS ASSESS the need for LABS as needed     Corrin Parker, M.D.  [facility]  Medical Director [place] Director [facility] Cardio-Pulmonary Department

## 2018-05-12 NOTE — Progress Notes (Signed)
Patient's BS 50- MD Kasa made aware- order received to start D5 @ 5550ml/hr, BS rechecked- 62, trending back up- Dr. Belia HemanKasa made aware. Per MD, no need to recheck again until next ordered check at 4pm.

## 2018-05-12 NOTE — Plan of Care (Signed)
Tube feeds kept at half rate this shift, Patient still having episodes of hypoglycemia- MD aware- patient started on D5, then D10 for low blood sugars. Patient back on trach collar as tolerated. Patient suctioned as needed. Patient complained this morning of pain r/t sciatica- given on time dose of Flexeril and started on Gabapentin, patient provided some relief and denied any pain the rest of the shift. Patient's family, including wife and mother in to visit with patient this shift. Patient repositioned for comfort as tolerated. Linens and underpad changed with peri- care. Heparin drip still infusing per pharmacy order.

## 2018-05-12 NOTE — Progress Notes (Signed)
  PT Cancellation Note  Patient Details Name: Alexander RiffleOtis Leon Alexander MRN: [id number] DOB: [birth year]   Cancelled Treatment:    Reason Eval/Treat Not Completed: Other (comment). Per chart review, pt with abnormal vitals with elevated HR and desats to 86% while on trach. Also having BS issues this date. Not appropriate for PT intervention at this time. Will re-attempt another date if medically stable.   Reine Bristow 05/12/2018, 4:10 PM  Elizabeth PalauStephanie Cambreigh Dearing, PT, DPT [phone]

## 2018-05-12 NOTE — Progress Notes (Signed)
[employer] - [place] at [facility]   PATIENT NAME: Alexander Alexander    MR#:  [id number]  DATE OF BIRTH:  1971-05-09  SUBJECTIVE:  CHIEF COMPLAINT:   Chief Complaint  Patient presents with  . Back Pain   No events overnight, and discussion with intensivist-continue to downgrade tracheostomy tube REVIEW OF SYSTEMS:  CONSTITUTIONAL: No fever, fatigue or weakness.  EYES: No blurred or double vision.  EARS, NOSE, AND THROAT: No tinnitus or ear pain.  RESPIRATORY: No cough, shortness of breath, wheezing or hemoptysis.  CARDIOVASCULAR: No chest pain, orthopnea, edema.  GASTROINTESTINAL: No nausea, vomiting, diarrhea or abdominal pain.  GENITOURINARY: No dysuria, hematuria.  ENDOCRINE: No polyuria, nocturia,  HEMATOLOGY: No anemia, easy bruising or bleeding SKIN: No rash or lesion. MUSCULOSKELETAL: No joint pain or arthritis.   NEUROLOGIC: No tingling, numbness, weakness.  PSYCHIATRY: No anxiety or depression.   ROS  DRUG ALLERGIES:   Allergies  Allergen Reactions  . Lexapro [Escitalopram Oxalate] Swelling    VITALS:  Blood pressure (!) 125/100, pulse (!) 118, temperature 99.1 F (37.3 C), temperature source Oral, resp. rate (!) 22, height 5\' 10"  (1.778 m), weight (!) 165.1 kg (363 lb 15.7 oz), SpO2 95 %.  PHYSICAL EXAMINATION:  GENERAL:  48 y.o.-year-old patient lying in the bed with no acute distress.  EYES: Pupils equal, round, reactive to light and accommodation. No scleral icterus. Extraocular muscles intact.  HEENT: Head atraumatic, normocephalic. Oropharynx and nasopharynx clear.  NECK:  Supple, no jugular venous distention. No thyroid enlargement, no tenderness.  LUNGS: Normal breath sounds bilaterally, no wheezing, rales,rhonchi or crepitation. No use of accessory muscles of respiration.  CARDIOVASCULAR: S1, S2 normal. No murmurs, rubs, or gallops.  ABDOMEN: Soft, nontender, nondistended. Bowel sounds present. No organomegaly or mass.  EXTREMITIES: No  pedal edema, cyanosis, or clubbing.  NEUROLOGIC: Cranial nerves II through XII are intact. Muscle strength 5/5 in all extremities. Sensation intact. Gait not checked.  PSYCHIATRIC: The patient is alert and oriented x 3.  SKIN: No obvious rash, lesion, or ulcer.   Physical Exam LABORATORY PANEL:   CBC Recent Labs  Lab 05/12/18 0320  WBC 7.4  HGB 11.4*  HCT 35.2*  PLT 263   ------------------------------------------------------------------------------------------------------------------  Chemistries  Recent Labs  Lab 05/06/18 0435  05/09/18 0534  05/12/18 0320  NA 136   < > 137   < > 135  K 5.3*   < > 5.1   < > 5.0  CL 100   < > 91*   < > 90*  CO2 29   < > 38*   < > 36*  GLUCOSE 171*   < > 142*   < > 49*  BUN 62*   < > 57*   < > 86*  CREATININE 2.20*   < > 2.28*   < > 3.31*  CALCIUM 9.0   < > 9.0   < > 9.7  MG 2.1  --   --   --   --   AST  --    < > 22  --   --   ALT  --    < > 21  --   --   ALKPHOS  --    < > 64  --   --   BILITOT  --    < > 0.9  --   --    < > = values in this interval not displayed.   ------------------------------------------------------------------------------------------------------------------  Cardiac Enzymes No results for input(s):  TROPONINI in the last 168 hours. ------------------------------------------------------------------------------------------------------------------  RADIOLOGY:  Dg Chest Port 1 View  Result Date: 05/11/2018 CLINICAL DATA:  Aspiration EXAM: PORTABLE CHEST 1 VIEW COMPARISON:  04/05/2018 FINDINGS: Cardiomegaly. Prior median sternotomy and valve replacement. Tracheostomy and NG tube are unchanged. Low lung volumes with vascular congestion. Interstitial prominence likely reflects early interstitial edema. Bibasilar atelectasis. IMPRESSION: Cardiomegaly with vascular congestion and probable early interstitial edema. Low lung volumes with bibasilar atelectasis. Electronically Signed   By: Charlett NoseKevin  Dover M.D.   On: 05/11/2018  16:00    ASSESSMENT AND PLAN:  47 year old male with past medical history significant for A. fib status post ablation, aortic valve replacement on warfarin, history of congestive heart failure, CKD, morbid obesity, diabetes and sleep apnea who was getting an outpatient MRI of his lower back had a cardiac arrest.  * Acute cardiopulmonary arrest Pulseless VT Difficult weaning from vent,s/p tracheostomy  Continuemechanical ventilation with weaning as tolerated  PEG by IR unsuccessful since safe window not found.  * AcuteMRSA/VAP Treated withcourse ofLinezolid Continue trach collar/vent protocol   *Atrial fibrillationwith RVR Stable Continuemetoprolol,Cardizem, amiodarone, continue Heparin drip  * History of mechanical aortic valve replacement Onheparin drip  * Acute toxic metabolic encephalopathy  Neurology input appreciated  Started on Kepprafor possible partial seizures. Later stopped.  * Chronic diabetes mellitus type 2 Stable on current regiment  * Acute renal failurew/ CKD stage III Secondary toATN  * Acute septic shock, MRSA pneumonia Resolved complete 14 days linezolid  Disposition to LTAC when able  All the records are reviewed and case discussed with Care Management/Social Workerr. Management plans discussed with the patient, family and they are in agreement.  CODE STATUS: full  TOTAL TIME TAKING CARE OF THIS PATIENT: 35 minutes.     POSSIBLE D/C IN 5 DAYS, DEPENDING ON CLINICAL CONDITION.   Evelena AsaMontell D Garielle Mroz M.D on 05/12/2018   Between 7am to 6pm - Pager - [phone]  After 6pm go to [email or web address] - password [employer] [facility] [facility]  [facility] [place] [facility]  Office  [phone]  CC: Primary care physician; Erasmo DownerBacigalupo, Angela M, MD  Note: This dictation was prepared with Dragon dictation along with smaller phrase technology. Any transcriptional errors that result from this process are unintentional.

## 2018-05-13 LAB — GLUCOSE, CAPILLARY
GLUCOSE-CAPILLARY: 101 mg/dL — AB (ref 70–99)
GLUCOSE-CAPILLARY: 192 mg/dL — AB (ref 70–99)
Glucose-Capillary: 102 mg/dL — ABNORMAL HIGH (ref 70–99)
Glucose-Capillary: 88 mg/dL (ref 70–99)
Glucose-Capillary: 180 mg/dL — ABNORMAL HIGH (ref 70–99)

## 2018-05-13 LAB — BASIC METABOLIC PANEL
ANION GAP: 9 (ref 5–15)
BUN: 80 mg/dL — ABNORMAL HIGH (ref 6–20)
CALCIUM: 9.3 mg/dL (ref 8.9–10.3)
CO2: 36 mmol/L — ABNORMAL HIGH (ref 22–32)
Chloride: 91 mmol/L — ABNORMAL LOW (ref 98–111)
Creatinine, Ser: 2.83 mg/dL — ABNORMAL HIGH (ref 0.61–1.24)
GFR, EST AFRICAN AMERICAN: 29 mL/min — AB (ref >60)
GFR, EST NON AFRICAN AMERICAN: 25 mL/min — AB (ref >60)
GLUCOSE: 113 mg/dL — AB (ref 70–99)
Potassium: 4.6 mmol/L (ref 3.5–5.1)
SODIUM: 136 mmol/L (ref 135–145)

## 2018-05-13 LAB — CBC
HCT: 32.9 % — ABNORMAL LOW (ref 40.0–52.0)
HEMOGLOBIN: 10.9 g/dL — AB (ref 13.0–18.0)
MCH: 27.5 pg (ref 26.0–34.0)
MCHC: 33.3 g/dL (ref 32.0–36.0)
MCV: 82.6 fL (ref 80.0–100.0)
Platelets: 236 10*3/uL (ref 150–440)
RBC: 3.98 MIL/uL — ABNORMAL LOW (ref 4.40–5.90)
RDW: 18.7 % — ABNORMAL HIGH (ref 11.5–14.5)
WBC: 7.1 10*3/uL (ref 3.8–10.6)

## 2018-05-13 LAB — MAGNESIUM: MAGNESIUM: 2.4 mg/dL (ref 1.7–2.4)

## 2018-05-13 LAB — HEPARIN LEVEL (UNFRACTIONATED): HEPARIN UNFRACTIONATED: 0.39 [IU]/mL (ref 0.30–0.70)

## 2018-05-13 LAB — PHOSPHORUS: PHOSPHORUS: 3.5 mg/dL (ref 2.5–4.6)

## 2018-05-13 NOTE — Progress Notes (Signed)
Occupational Therapy Treatment Patient Details Name: Alexander Alexander MRN: [id number] DOB: [birth year] Today's Date: 05/13/2018    History of present illness Pt. is a 47 y.o. male with PMH of atrial fibrillation, status post ablation and aortic valve replacement on long term warfarin, CHF, CKD, diabetes, obstructive sleep apnea and hypertension. Pt. had a pulseless VT cardiac arrest in MRI when he was being evaluated for back pain and sciatica, subsequently intubated in the intensive care unit. Intubated from 5/31-6/18 when trach placed. Pt for PEG placement tomorrow potentially. Of note, pt also with L LE DVT currently on heparin drip diagnosed on 6/21. PEG tube attempted yesterday, however unsuccessful. VItal go tilt bed obtained this date.   OT comments  Met pt sitting in bed/chair position with wife at bedside, agreeable to OT this date. Pt fluctuating level of alertness, but ultimately more alert and attentive to exercise/therapy. Pt smiling, joking, and making hand gestures to communicate with OT. When asked to introduce wife, pt points to ring finger and smiles. Pt very easy going this date, mouthing "it doesn't matter" and "sure" to OT. Pt attempting to write on clip board this date, writing numbers asked by wife. BUE theraband exercise program administered, shoulder flexion, elbow flexion, and shoulder abduction x10 each. Ceiling punches with no theraband x10. Noted visible shaking in BUE from muscle fatigue after resistive exercise, pt starting to lose alertness at this time from fatigue. Consistent verbal and tactile cues needed throughout BUE theraband exercises for pt to adhere appropriately. Pt washed face with bath wipe with min A needed to pull the wipe from the package and to maintain grip on the wipe (Dropped x2). Pt fixated on wiping nose (where NG is stuck with tape) consistent VC's needed to avoid this area. RN assist to reposition pt in bed, after showing his signs of discomfort with  total A to scoot up in bed, pt preferring to be back in bed/chair position to watch television at end of session.   Follow Up Recommendations  LTACH;SNF(pending pt progress)    Equipment Recommendations  Tub/shower seat;Tub/shower bench;3 in 1 bedside commode    Recommendations for Other Services      Precautions / Restrictions Precautions Precautions: Fall Restrictions Weight Bearing Restrictions: No       Mobility Bed Mobility Overal bed mobility: Needs Assistance Bed Mobility: Rolling Rolling: Max assist         General bed mobility comments: to reposition in bed  Transfers                      Balance Overall balance assessment: Mild deficits observed, not formally tested                                         ADL either performed or assessed with clinical judgement   ADL Overall ADL's : Needs assistance/impaired Eating/Feeding: NPO Eating/Feeding Details (indicate cue type and reason): NG Tube Grooming: Minimal assistance;Bed level Grooming Details (indicate cue type and reason): pt washed face with bath wipe, needing VC's to not wash off sticker holding down NG tube on nose         Upper Body Dressing : Maximal assistance;Cueing for sequencing Upper Body Dressing Details (indicate cue type and reason): pt donned gown in bed with max A and VC's for arm placement       Toilet Transfer Details (indicate cue  type and reason): foley in place       Tub/Shower Transfer Details (indicate cue type and reason): unsafe to attempt this date         Vision Patient Visual Report: No change from baseline     Perception     Praxis      Cognition Arousal/Alertness: Awake/alert(fluctuating, but alert with VC's) Behavior During Therapy: WFL for tasks assessed/performed Overall Cognitive Status: Within Functional Limits for tasks assessed                                 General Comments: pt fluctuating level of  alertness, improved with VC's and incorporating wife into session. Pt smiling, mouthing words, and making hand gestures to communicate this date        Exercises     Shoulder Instructions       General Comments      Pertinent Vitals/ Pain       Pain Assessment: Faces Faces Pain Scale: Hurts a little bit Pain Location: appears to be on bottom, in uncomfortable position, resolved once repositioned Pain Intervention(s): Repositioned;Monitored during session  Home Living                                          Prior Functioning/Environment              Frequency  Min 2X/week        Progress Toward Goals  OT Goals(current goals can now be found in the care plan section)  Progress towards OT goals: Progressing toward goals  Acute Rehab OT Goals Patient Stated Goal: to continue exercises to regain mobility  Plan Discharge plan remains appropriate;Frequency remains appropriate    Co-evaluation                 AM-PAC PT "6 Clicks" Daily Activity     Outcome Measure   Help from another person eating meals?: Total Help from another person taking care of personal grooming?: A Little Help from another person toileting, which includes using toliet, bedpan, or urinal?: Total Help from another person bathing (including washing, rinsing, drying)?: Total Help from another person to put on and taking off regular upper body clothing?: Total Help from another person to put on and taking off regular lower body clothing?: Total 6 Click Score: 8    End of Session    OT Visit Diagnosis: Muscle weakness (generalized) (M62.81)   Activity Tolerance Patient tolerated treatment well   Patient Left in bed;with call bell/phone within reach;with bed alarm set;with family/visitor present   Nurse Communication Mobility status        Time: 1400-1420 OT Time Calculation (min): 20 min  Charges: OT General Charges $OT Visit: 1 Visit OT  Treatments $Therapeutic Exercise: 8-22 mins  Zenovia Jarred, [place], OTR/L [phone]   Zenovia Jarred 05/13/2018, 2:36 PM

## 2018-05-13 NOTE — Progress Notes (Signed)
[employer] - [place] at [facility]   PATIENT NAME: Alexander Alexander    MR#:  [id number]  DATE OF BIRTH:  January 24, 1971  SUBJECTIVE:  CHIEF COMPLAINT:   Chief Complaint  Patient presents with  . Back Pain  No events overnight, remains on ventilator-trach dependent  REVIEW OF SYSTEMS:  CONSTITUTIONAL: No fever, fatigue or weakness.  EYES: No blurred or double vision.  EARS, NOSE, AND THROAT: No tinnitus or ear pain.  RESPIRATORY: No cough, shortness of breath, wheezing or hemoptysis.  CARDIOVASCULAR: No chest pain, orthopnea, edema.  GASTROINTESTINAL: No nausea, vomiting, diarrhea or abdominal pain.  GENITOURINARY: No dysuria, hematuria.  ENDOCRINE: No polyuria, nocturia,  HEMATOLOGY: No anemia, easy bruising or bleeding SKIN: No rash or lesion. MUSCULOSKELETAL: No joint pain or arthritis.   NEUROLOGIC: No tingling, numbness, weakness.  PSYCHIATRY: No anxiety or depression.   ROS  DRUG ALLERGIES:   Allergies  Allergen Reactions  . Lexapro [Escitalopram Oxalate] Swelling    VITALS:  Blood pressure 120/81, pulse 99, temperature 97.9 F (36.6 C), temperature source Axillary, resp. rate (!) 23, height 5\' 10"  (1.778 m), weight (!) 165.1 kg (363 lb 15.7 oz), SpO2 94 %.  PHYSICAL EXAMINATION:  GENERAL:  47 y.o.-year-old patient lying in the bed with no acute distress.  EYES: Pupils equal, round, reactive to light and accommodation. No scleral icterus. Extraocular muscles intact.  HEENT: Head atraumatic, normocephalic. Oropharynx and nasopharynx clear.  NECK:  Supple, no jugular venous distention. No thyroid enlargement, no tenderness.  LUNGS: Normal breath sounds bilaterally, no wheezing, rales,rhonchi or crepitation. No use of accessory muscles of respiration.  CARDIOVASCULAR: S1, S2 normal. No murmurs, rubs, or gallops.  ABDOMEN: Soft, nontender, nondistended. Bowel sounds present. No organomegaly or mass.  EXTREMITIES: No pedal edema, cyanosis, or clubbing.   NEUROLOGIC: Cranial nerves II through XII are intact. Muscle strength 5/5 in all extremities. Sensation intact. Gait not checked.  PSYCHIATRIC: The patient is alert and oriented x 3.  SKIN: No obvious rash, lesion, or ulcer.   Physical Exam LABORATORY PANEL:   CBC Recent Labs  Lab 05/13/18 0442  WBC 7.1  HGB 10.9*  HCT 32.9*  PLT 236   ------------------------------------------------------------------------------------------------------------------  Chemistries  Recent Labs  Lab 05/09/18 0534  05/13/18 0442  NA 137   < > 136  K 5.1   < > 4.6  CL 91*   < > 91*  CO2 38*   < > 36*  GLUCOSE 142*   < > 113*  BUN 57*   < > 80*  CREATININE 2.28*   < > 2.83*  CALCIUM 9.0   < > 9.3  MG  --   --  2.4  AST 22  --   --   ALT 21  --   --   ALKPHOS 64  --   --   BILITOT 0.9  --   --    < > = values in this interval not displayed.   ------------------------------------------------------------------------------------------------------------------  Cardiac Enzymes No results for input(s): TROPONINI in the last 168 hours. ------------------------------------------------------------------------------------------------------------------  RADIOLOGY:  Dg Chest Port 1 View  Result Date: 05/11/2018 CLINICAL DATA:  Aspiration EXAM: PORTABLE CHEST 1 VIEW COMPARISON:  04/05/2018 FINDINGS: Cardiomegaly. Prior median sternotomy and valve replacement. Tracheostomy and NG tube are unchanged. Low lung volumes with vascular congestion. Interstitial prominence likely reflects early interstitial edema. Bibasilar atelectasis. IMPRESSION: Cardiomegaly with vascular congestion and probable early interstitial edema. Low lung volumes with bibasilar atelectasis. Electronically Signed   By: Caryn Bee  Dover M.D.   On: 05/11/2018 16:00    ASSESSMENT AND PLAN:  47 year old male with past medical history significant for A. fib status post ablation, aortic valve replacement on warfarin, history of congestive heart  failure, CKD, morbid obesity, diabetes and sleep apnea who was getting an outpatient MRI of his lower back had a cardiac arrest.  * Acute cardiopulmonary arrest Pulseless VT Difficult weaning from vent,s/p tracheostomy  Continuemechanical ventilation with weaning as tolerated  PEG by IR unsuccessful since safe window not found.  * AcuteMRSA/VAP Treated withcourse ofLinezolid Continue trach collar/vent protocol   *Atrial fibrillationwith RVR Stable Continuemetoprolol,Cardizem, amiodarone, continue Heparin drip  * History of mechanical aortic valve replacement Onheparin drip  * Acute toxic metabolic encephalopathy  Neurology input appreciated  Started on Kepprafor possible partial seizures. Later stopped.  * Chronic diabetes mellitus type 2 Stable on current regiment  * Acute renal failurew/ CKD stage III Secondary toATN  * Acute septic shock, MRSA pneumonia Resolved complete 14 days linezolid  Disposition to LTAC when able  All the records are reviewed and case discussed with Care Management/Social Workerr. Management plans discussed with the patient, family and they are in agreement.  CODE STATUS: full TOTAL TIME TAKING CARE OF THIS PATIENT: 35 minutes.     POSSIBLE D/C IN 5 DAYS, DEPENDING ON CLINICAL CONDITION.   Evelena AsaMontell D Salary M.D on 05/13/2018   Between 7am to 6pm - Pager - [phone]  After 6pm go to [email or web address] - password [employer] [facility]  [facility] [place] [facility]  Office  [phone]  CC: Primary care physician; Erasmo DownerBacigalupo, Angela M, MD  Note: This dictation was prepared with Dragon dictation along with smaller phrase technology. Any transcriptional errors that result from this process are unintentional.

## 2018-05-13 NOTE — Progress Notes (Signed)
CRITICAL CARE NOTE  CC  follow up respiratory failure  SUBJECTIVE On vent Low FSBS Started d5 infusion      SIGNIFICANT EVENTS    BP (!) 114/95   Pulse (!) 108   Temp (!) 97.1 F (36.2 C) (Axillary)   Resp 19   Ht 5\' 10"  (1.778 m)   Wt (!) 363 lb 15.7 oz (165.1 kg)   SpO2 93%   BMI 52.23 kg/m    REVIEW OF SYSTEMS  PATIENT IS UNABLE TO PROVIDE COMPLETE REVIEW OF SYSTEM S DUE TO SEVERE CRITICAL ILLNESS AND ENCEPHALOPATHY   PHYSICAL EXAMINATION:  GENERAL:s/p trach HEAD: Normocephalic, atraumatic.  EYES: Pupils equal, round, reactive to light.  No scleral icterus.  MOUTH: Moist mucosal membrane. NECK: Supple. No thyromegaly. No nodules. No JVD.  PULMONARY: +rhonchi, CARDIOVASCULAR: S1 and S2. Regular rate and rhythm. No murmurs, rubs, or gallops.  GASTROINTESTINAL: Soft, nontender, -distended. No masses. Positive bowel sounds. No hepatosplenomegaly.  MUSCULOSKELETAL: No swelling, clubbing, or edema.  NEUROLOGIC: alert and awake SKIN:intact,warm,dry  ASSESSMENT AND PLAN Ventilator dependent respiratory failure. Status post tracheostomy.continuous trach collar as tolerated  History of V. Tach cardiac arrest. Chronic atrial fibrillation with rapid ventricular response, status post mechanical aortic valve replacement. Will continue heparin infusion, patient is on metoprolol and diltiazem for rate control  Renal failure. Has been stable, being followed by nephrology.Hyperkalemia     Severe Hypoxic and Hypercapnic Respiratory Failure Wean off vent as tolerated  Renal Failure-most likely due to ATN -follow chem 7 -follow UO -continue Foley Catheter-assess need   NEUROLOGY Avoid sedatives  Low FSBS Increased TF d5 infusion   DVT/GI PRX ordered TRANSFUSIONS AS NEEDED MONITOR FSBS ASSESS the need for LABS as needed      Lucie LeatherKurian David Tyeson Tanimoto, M.D.  [facility]  Medical Director [facility] Medical Director  [facility] Cardio-Pulmonary Department

## 2018-05-13 NOTE — Progress Notes (Signed)
Patient progressing well, more interactive and smiling, following commands and alert. Blood sugars maintaining with toleration of tube feed increases to reach goal. Per MD patient to be increased by 4110ml/hr q4hr until reaching goal of 4865ml/hr and leave D10 running to ensure stabilization of blood sugars. Patient tolerating current rate 50ml. Heparin drip infusing being maintained by pharmacy.Patient repositioned as tolerated and as needed. Vital signs stable. Medications given. [facility] and care transferred.

## 2018-05-13 NOTE — Progress Notes (Signed)
ANTICOAGULATION CONSULT NOTE   Pharmacy Consult for heparin drip management Indication: atrial fibrillation and aortic valve replacement   Allergies  Allergen Reactions  . Lexapro [Escitalopram Oxalate] Swelling    Patient Measurements: Height: 5\' 10"  (177.8 cm) Weight: (Unable to wiegh gue to bed) IBW/kg (Calculated) : 73 Heparin Dosing Weight: 121 kg (updated 6/21)  Vital Signs: Temp: 97.1 F (36.2 C) (07/06 0400) Temp Source: Axillary (07/06 0400) BP: 102/85 (07/06 0400) Pulse Rate: 100 (07/06 0400)  Labs: Recent Labs    05/10/18 0745  05/12/18 0320 05/12/18 1315 05/12/18 2015 05/13/18 0442  HGB 12.2*  --  11.4*  --   --  10.9*  HCT 37.0*  --  35.2*  --   --  32.9*  PLT 251  --  263  --   --  236  HEPARINUNFRC  --    < > 0.22* 0.46 0.44 0.39  CREATININE 2.23*  --  3.31*  --   --  2.83*   < > = values in this interval not displayed.    Estimated Creatinine Clearance: 49 mL/min (A) (by C-G formula based on SCr of 2.83 mg/dL (H)).   Medical History: Past Medical History:  Diagnosis Date  . Allergy   . Anxiety   . Arrhythmia   . Arthritis   . Atrial fibrillation (HCC)   . CHF (congestive heart failure) (HCC)   . CKD (chronic kidney disease)   . Diabetes mellitus without complication (HCC)   . Diabetes mellitus, type II (HCC)   . Heart failure (HCC)   . Hyperlipidemia   . Hypertension   . Pancreatitis   . Sleep apnea     Medications:  Scheduled:  . amLODipine  10 mg Per Tube Daily  . budesonide (PULMICORT) nebulizer solution  0.25 mg Nebulization Q6H  . chlorhexidine  15 mL Mouth Rinse BID  . clonazePAM  1 mg Per Tube Daily  . clonazePAM  2 mg Per Tube QHS  . diltiazem  60 mg Oral Q6H  . famotidine  20 mg Per Tube Daily  . feeding supplement (PRO-STAT SUGAR FREE 64)  30 mL Per Tube Daily  . fentaNYL  150 mcg Transdermal Q72H  . fluvoxaMINE  25 mg Per Tube QHS  . free water  250 mL Per Tube Q6H  . gabapentin  600 mg Per Tube TID  . insulin  aspart  0-20 Units Subcutaneous Q4H  . ipratropium-albuterol  3 mL Nebulization Q6H  . mouth rinse  15 mL Mouth Rinse 10 times per day  . metoprolol tartrate  50 mg Per Tube BID  . sodium chloride flush  10-40 mL Intracatheter Q12H   Infusions:  . dextrose 50 mL/hr at 05/13/18 0400  . feeding supplement (NEPRO CARB STEADY) 30 mL/hr at 05/13/18 0400  . heparin 1,650 Units/hr (05/13/18 0400)    Assessment: Pharmacy consulted for warfarin and heparin drip management for 47 yo male admitted to the ICU s/p cardiac arrest. Patient has history significant for aortic valve replacement, atrial fibrillation and CHF. Head CT is negative for infarction or hemorrhage.   Patient takes warfarin 5mg  daily as an outpatient for goal INR 2.5-3.5.   Goal of Therapy:  INR 2-3 Heparin level 0.3-0.7 units/ml Monitor platelets by anticoagulation protocol: Yes   Plan:  07/06 @ 0500 HL 0.39 therapeutic. Will continue at 1650 units/hr and will recheck anti-Xa w/ am labs. Hgb slowly trending down, will continue to monitor.  Thomasene Rippleavid Taj Nevins, PharmD, BCPS Clinical Pharmacist 05/13/2018

## 2018-05-13 NOTE — Progress Notes (Signed)
SLP Cancellation Note  Patient Details Name: Alexander Alexander MRN: [id number] DOB: [birth year]   Cancelled treatment:       Reason Eval/Treat Not Completed: Patient not medically ready(chart reviewed). Noted pt was returned to full vent support per MD note d/t increased somnolence and hypercapnic failure; also N/V. ST services will continue to f/u w/ pt's status for needs while admitted.     Jerilynn SomKatherine Sommer Spickard, MS, CCC-SLP Favor Kreh 05/12/2018, 08:15AM

## 2018-05-14 LAB — GLUCOSE, CAPILLARY
GLUCOSE-CAPILLARY: 220 mg/dL — AB (ref 70–99)
GLUCOSE-CAPILLARY: 242 mg/dL — AB (ref 70–99)
GLUCOSE-CAPILLARY: 245 mg/dL — AB (ref 70–99)
GLUCOSE-CAPILLARY: 196 mg/dL — AB (ref 70–99)
Glucose-Capillary: 118 mg/dL — ABNORMAL HIGH (ref 70–99)
Glucose-Capillary: 168 mg/dL — ABNORMAL HIGH (ref 70–99)
Glucose-Capillary: 174 mg/dL — ABNORMAL HIGH (ref 70–99)

## 2018-05-14 LAB — HEPARIN LEVEL (UNFRACTIONATED): HEPARIN UNFRACTIONATED: 0.37 [IU]/mL (ref 0.30–0.70)

## 2018-05-14 LAB — MRSA PCR SCREENING: MRSA BY PCR: POSITIVE — AB

## 2018-05-14 MED ORDER — IPRATROPIUM-ALBUTEROL 0.5-2.5 (3) MG/3ML IN SOLN
3.0000 mL | Freq: Once | RESPIRATORY_TRACT | Status: AC
  Administered 2018-05-14: 3 mL via RESPIRATORY_TRACT

## 2018-05-14 MED ORDER — FENTANYL CITRATE (PF) 100 MCG/2ML IJ SOLN
50.0000 ug | Freq: Once | INTRAMUSCULAR | Status: AC
Start: 2018-05-14 — End: 2018-05-14
  Administered 2018-05-14: 50 ug via INTRAVENOUS
  Filled 2018-05-14: qty 2

## 2018-05-14 NOTE — Progress Notes (Signed)
[facility] [place] [facility]   PATIENT NAME: Alexander HeadyOtis Alexander    MR#:  [id number]  DATE OF BIRTH:  01-01-1971  SUBJECTIVE:   Pt. S/p Trach on vent and on 45% fio2.  No acute events overnight. Tolerating tube feeds through Dobhoff.  Wife at bedside.  Follow simple commands.   REVIEW OF SYSTEMS:    Review of Systems  Constitutional: Negative for chills and fever.  HENT: Negative for congestion and tinnitus.   Eyes: Negative for blurred vision and double vision.  Respiratory: Negative for cough, shortness of breath and wheezing.   Cardiovascular: Negative for chest pain, orthopnea and PND.  Gastrointestinal: Negative for abdominal pain, diarrhea, nausea and vomiting.  Genitourinary: Negative for dysuria and hematuria.  Neurological: Negative for dizziness, sensory change and focal weakness.  All other systems reviewed and are negative.   Nutrition: Tube feeds  Tolerating Diet: Yes Tolerating PT: Await Eval.   DRUG ALLERGIES:   Allergies  Allergen Reactions  . Lexapro [Escitalopram Oxalate] Swelling    VITALS:  Blood pressure 134/90, pulse 90, temperature 99 F (37.2 C), resp. rate (!) 9, height 5\' 10"  (1.778 m), weight (!) 164.7 kg (363 lb 1.6 oz), SpO2 97 %.  PHYSICAL EXAMINATION:   Physical Exam  GENERAL:  47 y.o.-year-old patient lying in bed s/p Trach on vent but in NAD EYES: Pupils equal, round, reactive to light. No scleral icterus. Extraocular muscles intact.  HEENT: Head atraumatic, normocephalic. Oropharynx and nasopharynx clear. S/p Trach and on vent.  Dobhoff tube in place and tolerating tube feeds.  NECK:  Supple, no jugular venous distention. No thyroid enlargement, no tenderness.  LUNGS: Normal breath sounds bilaterally, no wheezing, rales, rhonchi. No use of accessory muscles of respiration.  CARDIOVASCULAR: S1, S2 RRR, + Metallic valve click. No murmurs, rubs, or gallops.  ABDOMEN: Soft, nontender, nondistended. Bowel sounds present. No  organomegaly or mass.  EXTREMITIES: No cyanosis, clubbing or edema b/l.    NEUROLOGIC: Cranial nerves II through XII are intact. No focal Motor or sensory deficits b/l. Globally weak  PSYCHIATRIC: The patient is alert and oriented x 1. SKIN: No obvious rash, lesion, or ulcer.    LABORATORY PANEL:   CBC Recent Labs  Lab 05/13/18 0442  WBC 7.1  HGB 10.9*  HCT 32.9*  PLT 236   ------------------------------------------------------------------------------------------------------------------  Chemistries  Recent Labs  Lab 05/09/18 0534  05/13/18 0442  NA 137   < > 136  K 5.1   < > 4.6  CL 91*   < > 91*  CO2 38*   < > 36*  GLUCOSE 142*   < > 113*  BUN 57*   < > 80*  CREATININE 2.28*   < > 2.83*  CALCIUM 9.0   < > 9.3  MG  --   --  2.4  AST 22  --   --   ALT 21  --   --   ALKPHOS 64  --   --   BILITOT 0.9  --   --    < > = values in this interval not displayed.   ------------------------------------------------------------------------------------------------------------------  Cardiac Enzymes No results for input(s): TROPONINI in the last 168 hours. ------------------------------------------------------------------------------------------------------------------  RADIOLOGY:  No results found.   ASSESSMENT AND PLAN:   47 year old male with past medical history significant for A. fib status post ablation, aortic valve replacement on warfarin, history of congestive heart failure, CKD, morbid obesity, diabetes and sleep apnea who was getting an outpatient MRI of  his lower back had a cardiac arrest.  * Acute cardiopulmonary arrest  - Pulseless VT Difficult weaning from vent,s/p tracheostomy  Continuemechanical ventilation with weaning as per Intensivist.   * AcuteMRSA/VAP Treated withcourse ofLinezolid Continue trach collar/vent protocol  - afebrile/hemodynamically stable.   *Atrial fibrillationwith RVR - rate controlled.  -  Continuemetoprolol,Cardizem.  - cont. Heparin gtt given pt. Hx of Mechanical aortic valve placement.   * History of mechanical aortic valve replacement - cont. Heparin gtt.   * Acute toxic metabolic encephalopathy  - Neurology input appreciated  - initially Started on Kepprafor possible partial seizures. Later stopped.  * Chronic diabetes mellitus type 2 - cont. SSI. BS stable.   * Acute renal failurew/ CKD stage III Secondary toATN, Cr. Close to baseline now.   Disposition to LTAC when able    All the records are reviewed and case discussed with Care Management/Social Worker. Management plans discussed with the patient, family and they are in agreement.  CODE STATUS: full code  DVT Prophylaxis: Heparin gtt  TOTAL TIME TAKING CARE OF THIS PATIENT: 30 minutes.   POSSIBLE D/C unclear DAYS, DEPENDING ON CLINICAL CONDITION and progress.    Houston Siren M.D on 05/14/2018 at 1:50 PM  Between 7am to 6pm - Pager - [phone]  After 6pm go to [email or web address] - Social research officer, government  [facility] [place] [facility]  Office  [phone]  CC: Primary care physician; Erasmo Downer, MD

## 2018-05-14 NOTE — Progress Notes (Signed)
CRITICAL CARE NOTE  CC  follow up respiratory failure  SUBJECTIVE Remains on vent TF increased to goal Alert and awake Follows commands     BP (!) 112/100   Pulse 90   Temp 99 F (37.2 C)   Resp 16   Ht 5\' 10"  (1.778 m)   Wt (!) 363 lb 1.6 oz (164.7 kg)   SpO2 96%   BMI 52.10 kg/m    REVIEW OF SYSTEMS Limited due to vent   PHYSICAL EXAMINATION:  GENERAL:NAD HEAD: Normocephalic, atraumatic.  EYES: Pupils equal, round, reactive to light.  No scleral icterus.  MOUTH: Moist mucosal membrane. NECK: Supple. No thyromegaly. No nodules. No JVD.  PULMONARY: +rhonchi, +wheezing CARDIOVASCULAR: S1 and S2. Regular rate and rhythm. No murmurs, rubs, or gallops.  GASTROINTESTINAL: Soft, nontender, -distended. No masses. Positive bowel sounds. No hepatosplenomegaly.  MUSCULOSKELETAL:+ edema.  NEUROLOGIC: alert and awake, no focal deficits SKIN:intact,warm,dry  ASSESSMENT AND PLAN  Severe Hypoxic and Hypercapnic Respiratory Failure Wean vent as tolerated -continue Bronchodilator Therapy  Renal Failure-most likely due to ATN -follow chem 7 -follow UO -continue Foley Catheter-assess need   CARDIAC ICU monitoring Continue heparin for mechanical valve  DVT/GI PRX ordered TRANSFUSIONS AS NEEDED MONITOR FSBS ASSESS the need for LABS as needed     Lucie LeatherKurian David Meilani Edmundson, M.D.  [facility]  Medical Director [facility] Medical Director [facility] Cardio-P[facility]

## 2018-05-14 NOTE — Progress Notes (Addendum)
RT assisted with moving patient to chair at bedside with no complications.

## 2018-05-14 NOTE — Progress Notes (Signed)
ANTICOAGULATION CONSULT NOTE   Pharmacy Consult for heparin drip management Indication: atrial fibrillation and aortic valve replacement   Allergies  Allergen Reactions  . Lexapro [Escitalopram Oxalate] Swelling    Patient Measurements: Height: 5\' 10"  (177.8 cm) Weight: (!) 363 lb 1.6 oz (164.7 kg) IBW/kg (Calculated) : 73 Heparin Dosing Weight: 121 kg (updated 6/21)  Vital Signs: Temp: 98.1 F (36.7 C) (07/07 0200) Temp Source: Oral (07/07 0200) BP: 121/96 (07/07 0500) Pulse Rate: 82 (07/07 0500)  Labs: Recent Labs    05/12/18 0320  05/12/18 2015 05/13/18 0442 05/14/18 0411  HGB 11.4*  --   --  10.9*  --   HCT 35.2*  --   --  32.9*  --   PLT 263  --   --  236  --   HEPARINUNFRC 0.22*   < > 0.44 0.39 0.37  CREATININE 3.31*  --   --  2.83*  --    < > = values in this interval not displayed.    Estimated Creatinine Clearance: 49 mL/min (A) (by C-G formula based on SCr of 2.83 mg/dL (H)).   Medical History: Past Medical History:  Diagnosis Date  . Allergy   . Anxiety   . Arrhythmia   . Arthritis   . Atrial fibrillation (HCC)   . CHF (congestive heart failure) (HCC)   . CKD (chronic kidney disease)   . Diabetes mellitus without complication (HCC)   . Diabetes mellitus, type II (HCC)   . Heart failure (HCC)   . Hyperlipidemia   . Hypertension   . Pancreatitis   . Sleep apnea     Medications:  Scheduled:  . amLODipine  10 mg Per Tube Daily  . budesonide (PULMICORT) nebulizer solution  0.25 mg Nebulization Q6H  . chlorhexidine  15 mL Mouth Rinse BID  . clonazePAM  1 mg Per Tube Daily  . clonazePAM  2 mg Per Tube QHS  . diltiazem  60 mg Oral Q6H  . famotidine  20 mg Per Tube Daily  . feeding supplement (PRO-STAT SUGAR FREE 64)  30 mL Per Tube Daily  . fentaNYL  150 mcg Transdermal Q72H  . fluvoxaMINE  25 mg Per Tube QHS  . free water  250 mL Per Tube Q6H  . gabapentin  600 mg Per Tube TID  . insulin aspart  0-20 Units Subcutaneous Q4H  .  ipratropium-albuterol  3 mL Nebulization Q6H  . mouth rinse  15 mL Mouth Rinse 10 times per day  . metoprolol tartrate  50 mg Per Tube BID  . sodium chloride flush  10-40 mL Intracatheter Q12H   Infusions:  . feeding supplement (NEPRO CARB STEADY) 1,000 mL (05/14/18 0400)  . heparin 1,650 Units/hr (05/13/18 2104)    Assessment: Pharmacy consulted for warfarin and heparin drip management for 47 yo male admitted to the ICU s/p cardiac arrest. Patient has history significant for aortic valve replacement, atrial fibrillation and CHF. Head CT is negative for infarction or hemorrhage.   Patient takes warfarin 5mg  daily as an outpatient for goal INR 2.5-3.5.   Goal of Therapy:  INR 2-3 Heparin level 0.3-0.7 units/ml Monitor platelets by anticoagulation protocol: Yes   Plan:  07/07 @ 0400 HL 0.37 therapeutic. Will continue at 1650 units/hr and will recheck w/ am labs.  Thomasene Rippleavid Shiree Altemus, PharmD, BCPS Clinical Pharmacist 05/14/2018

## 2018-05-14 NOTE — Progress Notes (Signed)
Patient placed on trach collar at 50% after neb treatment finished in line with vent. Patients inner cannula was replaced with new and his trach gauze was changed as well. Patient tolerating well at this time, will continue to monitor.

## 2018-05-15 LAB — BASIC METABOLIC PANEL
Anion gap: 9 (ref 5–15)
BUN: 70 mg/dL — ABNORMAL HIGH (ref 6–20)
CALCIUM: 9.5 mg/dL (ref 8.9–10.3)
CO2: 34 mmol/L — AB (ref 22–32)
CREATININE: 2.41 mg/dL — AB (ref 0.61–1.24)
Chloride: 91 mmol/L — ABNORMAL LOW (ref 98–111)
GFR, EST AFRICAN AMERICAN: 35 mL/min — AB (ref >60)
GFR, EST NON AFRICAN AMERICAN: 30 mL/min — AB (ref >60)
Glucose, Bld: 166 mg/dL — ABNORMAL HIGH (ref 70–99)
Potassium: 4.7 mmol/L (ref 3.5–5.1)
SODIUM: 134 mmol/L — AB (ref 135–145)

## 2018-05-15 LAB — GLUCOSE, CAPILLARY
GLUCOSE-CAPILLARY: 221 mg/dL — AB (ref 70–99)
GLUCOSE-CAPILLARY: 217 mg/dL — AB (ref 70–99)
GLUCOSE-CAPILLARY: 161 mg/dL — AB (ref 70–99)
GLUCOSE-CAPILLARY: 147 mg/dL — AB (ref 70–99)
Glucose-Capillary: 144 mg/dL — ABNORMAL HIGH (ref 70–99)

## 2018-05-15 LAB — CBC
HCT: 34.8 % — ABNORMAL LOW (ref 40.0–52.0)
Hemoglobin: 11.5 g/dL — ABNORMAL LOW (ref 13.0–18.0)
MCH: 27.3 pg (ref 26.0–34.0)
MCHC: 33 g/dL (ref 32.0–36.0)
MCV: 82.7 fL (ref 80.0–100.0)
PLATELETS: 221 10*3/uL (ref 150–440)
RBC: 4.21 MIL/uL — AB (ref 4.40–5.90)
RDW: 18.3 % — ABNORMAL HIGH (ref 11.5–14.5)
WBC: 6 10*3/uL (ref 3.8–10.6)

## 2018-05-15 LAB — HEPARIN LEVEL (UNFRACTIONATED)
HEPARIN UNFRACTIONATED: 0.32 [IU]/mL (ref 0.30–0.70)
Heparin Unfractionated: 0.33 IU/mL (ref 0.30–0.70)
Heparin Unfractionated: 0.43 IU/mL (ref 0.30–0.70)

## 2018-05-15 MED ORDER — ALPRAZOLAM 0.5 MG PO TABS
0.5000 mg | ORAL_TABLET | Freq: Two times a day (BID) | ORAL | Status: DC
  Administered 2018-05-16 – 2018-05-19 (×6): 0.5 mg
  Filled 2018-05-15 (×6): qty 1

## 2018-05-15 MED ORDER — FAMOTIDINE IN NACL 20-0.9 MG/50ML-% IV SOLN
20.0000 mg | Freq: Two times a day (BID) | INTRAVENOUS | Status: DC
  Administered 2018-05-15 – 2018-05-18 (×7): 20 mg via INTRAVENOUS
  Filled 2018-05-15 (×7): qty 50

## 2018-05-15 MED ORDER — WARFARIN SODIUM 5 MG PO TABS
5.0000 mg | ORAL_TABLET | Freq: Every day | ORAL | Status: DC
  Filled 2018-05-15: qty 1

## 2018-05-15 MED ORDER — SODIUM CHLORIDE 0.9 % IV SOLN
500.0000 mg | Freq: Three times a day (TID) | INTRAVENOUS | Status: AC
  Administered 2018-05-15 – 2018-05-17 (×6): 500 mg via INTRAVENOUS
  Filled 2018-05-15 (×8): qty 10

## 2018-05-15 MED ORDER — BISACODYL 10 MG RE SUPP
10.0000 mg | Freq: Once | RECTAL | Status: AC
Start: 2018-05-15 — End: 2018-05-15
  Administered 2018-05-15: 10 mg via RECTAL
  Filled 2018-05-15: qty 1

## 2018-05-15 MED ORDER — DIAZEPAM 5 MG/ML IJ SOLN
2.5000 mg | Freq: Once | INTRAMUSCULAR | Status: AC
  Administered 2018-05-15: 2.5 mg via INTRAVENOUS
  Filled 2018-05-15: qty 2

## 2018-05-15 MED ORDER — WARFARIN - PHARMACIST DOSING INPATIENT: Freq: Every day | Status: DC

## 2018-05-15 NOTE — Care Management (Addendum)
Message sent to Clydie BraunKaren [facility] as patient was put back on vent support last PM. Last week, MD declined to do peer to peer after insurance denied LTAC intervention; patient was doing much better at that time and hope was that he could go to rehab. Per Clydie BraunKaren, provider would need to provide a written appeal. Message sent to Dr. Belia HemanKasa.

## 2018-05-15 NOTE — Progress Notes (Signed)
CRITICAL CARE NOTE  CC  follow up respiratory failure  SUBJECTIVE Had to be placed on full  Vent support last night due to increased PCO2 levels TF increased to goal Alert and awake this AM Follows commands     BP 123/77 (BP Location: Left Wrist)   Pulse 84   Temp 99 F (37.2 C) (Oral)   Resp (!) 22   Ht 5\' 10"  (1.778 m)   Wt (!) 371 lb 14.7 oz (168.7 kg)   SpO2 98%   BMI 53.36 kg/m    REVIEW OF SYSTEMS Limited due to vent   PHYSICAL EXAMINATION:  GENERAL:NAD HEAD: Normocephalic, atraumatic.  EYES: Pupils equal, round, reactive to light.  No scleral icterus.  MOUTH: Moist mucosal membrane. NECK: Supple. No thyromegaly. No nodules. No JVD.  PULMONARY: +rhonchi, CARDIOVASCULAR: S1 and S2. Regular rate and rhythm. No murmurs, rubs, or gallops.  GASTROINTESTINAL: Soft, nontender, -distended. No masses. Positive bowel sounds. No hepatosplenomegaly.  MUSCULOSKELETAL:+ edema.  NEUROLOGIC: alert and awake, no focal deficits SKIN:intact,warm,dry  ASSESSMENT AND PLAN 47 yo morbidly obese AAM with acute cardiac arrest with severe resp failure, s/p trach for failure to wean from vent now vent dependent chronic resp failure with underlying OSA With Mechanical AV   Severe Hypoxic and Hypercapnic Respiratory Failure Wean vent as tolerated -continue Bronchodilator Therapy  Renal Failure-most likely due to ATN -follow chem 7 -follow UO -continue Foley Catheter-assess need   CARDIAC ICU monitoring Continue heparin for mechanical valve  DVT/GI PRX ordered TRANSFUSIONS AS NEEDED MONITOR FSBS ASSESS the need for LABS as needed   PLAN TO WEAN TO PS MODE AS TOLERATED for next 24 hrs  Kiyaan Haq Santiago Gladavid Cathalina Barcia, M.D.  [facility]  Medical Director [facility] Medical Director [facility] Card[facility]

## 2018-05-15 NOTE — Progress Notes (Signed)
ANTICOAGULATION CONSULT NOTE   Pharmacy Consult for heparin drip management Indication: atrial fibrillation and aortic valve replacement   Allergies  Allergen Reactions  . Lexapro [Escitalopram Oxalate] Swelling    Patient Measurements: Height: 5\' 10"  (177.8 cm) Weight: (!) 371 lb 14.7 oz (168.7 kg) IBW/kg (Calculated) : 73 Heparin Dosing Weight: 121 kg (updated 6/21)  Vital Signs: Temp: 98.2 F (36.8 C) (07/08 1600) Temp Source: Oral (07/08 1600) BP: 121/91 (07/08 1800) Pulse Rate: 145 (07/08 1800)  Labs: Recent Labs    05/13/18 0442  05/15/18 0504 05/15/18 1053 05/15/18 1753  HGB 10.9*  --   --  11.5*  --   HCT 32.9*  --   --  34.8*  --   PLT 236  --   --  221  --   HEPARINUNFRC 0.39   < > 0.33 0.32 0.43  CREATININE 2.83*  --   --  2.41*  --    < > = values in this interval not displayed.    Estimated Creatinine Clearance: 58.4 mL/min (A) (by C-G formula based on SCr of 2.41 mg/dL (H)).   Medical History: Past Medical History:  Diagnosis Date  . Allergy   . Anxiety   . Arrhythmia   . Arthritis   . Atrial fibrillation (HCC)   . CHF (congestive heart failure) (HCC)   . CKD (chronic kidney disease)   . Diabetes mellitus without complication (HCC)   . Diabetes mellitus, type II (HCC)   . Heart failure (HCC)   . Hyperlipidemia   . Hypertension   . Pancreatitis   . Sleep apnea     Medications:  Scheduled:  . [START ON 05/16/2018] ALPRAZolam  0.5 mg Per Tube BID  . amLODipine  10 mg Per Tube Daily  . budesonide (PULMICORT) nebulizer solution  0.25 mg Nebulization Q6H  . chlorhexidine  15 mL Mouth Rinse BID  . diazepam  2.5 mg Intravenous Once  . diltiazem  60 mg Oral Q6H  . feeding supplement (PRO-STAT SUGAR FREE 64)  30 mL Per Tube Daily  . fentaNYL  150 mcg Transdermal Q72H  . fluvoxaMINE  25 mg Per Tube QHS  . free water  250 mL Per Tube Q6H  . gabapentin  600 mg Per Tube TID  . insulin aspart  0-20 Units Subcutaneous Q4H  . ipratropium-albuterol   3 mL Nebulization Q6H  . mouth rinse  15 mL Mouth Rinse 10 times per day  . metoprolol tartrate  50 mg Per Tube BID  . sodium chloride flush  10-40 mL Intracatheter Q12H  . Warfarin - Pharmacist Dosing Inpatient   Does not apply q1800   Infusions:  . erythromycin Stopped (05/15/18 1433)  . famotidine (PEPCID) IV    . feeding supplement (NEPRO CARB STEADY) Stopped (05/15/18 1112)  . heparin 1,700 Units/hr (05/15/18 1800)    Assessment: Pharmacy consulted for warfarin and heparin drip management for 47 yo male admitted to the ICU s/p cardiac arrest. Patient has history significant for aortic valve replacement, atrial fibrillation and CHF. Head CT is negative for infarction or hemorrhage.   Patient takes warfarin 5mg  daily as an outpatient for goal INR 2.5-3.5.   Goal of Therapy:  INR 2-3 Heparin level 0.3-0.7 units/ml Monitor platelets by anticoagulation protocol: Yes   Plan:  Continue heparin at 1700 units/hr. Will obtain heparin level with am labs.   Warfarin planned to be initiated today. Patient no longer tolerating tube feeds. Will hold warfarin today and plan to start  warfarin on 7/9.   Pharmacy will continue to monitor and adjust per consult.     MLS 05/15/2018

## 2018-05-15 NOTE — Progress Notes (Signed)
Placed patient back on rate and tidal volume. States he is feeling somewhat tired. Patient tolerated change well.

## 2018-05-15 NOTE — Progress Notes (Signed)
Physical Therapy Treatment Patient Details Name: Alexander Alexander MRN: [id number] DOB: 1971/04/11 Today's Date: 05/15/2018    History of Present Illness Pt. is a 47 y.o. male with PMH of atrial fibrillation, status post ablation and aortic valve replacement on long term warfarin, CHF, CKD, diabetes, obstructive sleep apnea and hypertension. Pt. had a pulseless VT cardiac arrest in MRI when he was being evaluated for back pain and sciatica, subsequently intubated in the intensive care unit. Intubated from 5/31-6/18 when trach placed. Pt for PEG placement tomorrow potentially. Of note, pt also with L LE DVT currently on heparin drip diagnosed on 6/21. PEG tube attempted yesterday, however unsuccessful. VItal go tilt bed obtained this date.    PT Comments    Pt is doing very well this date and states that he is doing great. Pt speaks in short phrases and is much more awake and alert this session. Pt able to follow all verbal commands. Pt instructed in extensive there-ex supine, and sitting EOB. Pt demonstrates increased independence with bed mobility this date being able to scoot and position self in bed. Pt able to sit EOB with mod assist supine>sit and maintain sitting with intermittent UE support. Pt tolerated treatment well however was fatigued by end of session. Pt demonstrates weakness of LEs L>R. Pt still unable to shift full attention to L however improving being able to look past midline. PT plans to attempt sit<>stand transfer with RW at next date. RN sabrina assisted during session with line and lead management. Pt could benefit from continued skilled therapy at this time to improve deficits toward PLOF. PT will continue to work with pt at least 2x/week while admitted. D/c recommendations continue to be SNF/LTACH.    Follow Up Recommendations  SNF;LTACH     Equipment Recommendations       Recommendations for Other Services       Precautions / Restrictions Precautions Precautions:  Fall Restrictions Weight Bearing Restrictions: No    Mobility  Bed Mobility Overal bed mobility: Needs Assistance Bed Mobility: Rolling(scooting) Rolling: Modified independent (Device/Increase time)(increased UE use to pull and roll)   Supine to sit: Mod assist     General bed mobility comments: pt able to modified independnetly roll in bed by using UEs to pull on bed rails and roll. Pt initates motion to supine>sit however requires mod assistance with LEs once sitting pt able to maintain upright posture independnetly. pt requires variable assistance to scoot, between CGA to min assistance scooting up in bed both supine and sitting EOB. Pt mod to max assist sit>supine due to fatigue after sitting EOB.  Transfers                 General transfer comment: Not assessed this visit due to pt fatigue. PT plans to attempt sit<>stand transfer possibly next session.  Ambulation/Gait                 Stairs             Wheelchair Mobility    Modified Rankin (Stroke Patients Only)       Balance Overall balance assessment: Needs assistance   Sitting balance-Leahy Scale: Good Sitting balance - Comments: Pt displays good sitting balance able to reach outside BOS and rotates with variable UE support and B feet on floor. Pt able to correct when reaching outside BOS to maintain upright posture.  Cognition Arousal/Alertness: Awake/alert Behavior During Therapy: WFL for tasks assessed/performed Overall Cognitive Status: Within Functional Limits for tasks assessed                                 General Comments: pt very alert this date, able to speak small phrases and follow commands well.      Exercises Other Exercises Other Exercises: Pt instructed in various supine and seated exercise x10 reps or until fatigue including LAQ, hip abd, hip adduction isometrics, knee extension isometrics, ankle pumps,  seated reaching outside BOS, seated trunk rotation, UE theraband abduction and flexion.    General Comments        Pertinent Vitals/Pain Pain Assessment: No/denies pain Pain Score: 0-No pain    Home Living                      Prior Function            PT Goals (current goals can now be found in the care plan section) Acute Rehab PT Goals Patient Stated Goal: to continue exercises to regain mobility, pt states that he wants to walk PT Goal Formulation: Patient unable to participate in goal setting Time For Goal Achievement: 05/17/18 Potential to Achieve Goals: Fair Progress towards PT goals: Progressing toward goals    Frequency    Min 2X/week      PT Plan Current plan remains appropriate    Co-evaluation              AM-PAC PT "6 Clicks" Daily Activity  Outcome Measure  Difficulty turning over in bed (including adjusting bedclothes, sheets and blankets)?: A Little Difficulty moving from lying on back to sitting on the side of the bed? : Unable Difficulty sitting down on and standing up from a chair with arms (e.g., wheelchair, bedside commode, etc,.)?: Unable Help needed moving to and from a bed to chair (including a wheelchair)?: Total Help needed walking in hospital room?: Total Help needed climbing 3-5 steps with a railing? : Total 6 Click Score: 8    End of Session   Activity Tolerance: Patient limited by fatigue Patient left: in bed;with call bell/phone within reach Nurse Communication: Mobility status PT Visit Diagnosis: Muscle weakness (generalized) (M62.81);Difficulty in walking, not elsewhere classified (R26.2);Other symptoms and signs involving the nervous system (Z61.096(R29.898)     Time: [id number] PT Time Calculation (min) (ACUTE ONLY): 38 min  Charges:                       G Codes:       Melissia Lahman Elnora Morrisonhaffin, SPT    Aerion Bagdasarian 05/15/2018, 11:48 AM

## 2018-05-15 NOTE — Progress Notes (Signed)
ANTICOAGULATION CONSULT NOTE   Pharmacy Consult for heparin drip management Indication: atrial fibrillation and aortic valve replacement   Allergies  Allergen Reactions  . Lexapro [Escitalopram Oxalate] Swelling    Patient Measurements: Height: 5\' 10"  (177.8 cm) Weight: (!) 371 lb 14.7 oz (168.7 kg) IBW/kg (Calculated) : 73 Heparin Dosing Weight: 121 kg (updated 6/21)  Vital Signs: Temp: 99 F (37.2 C) (07/08 0800) Temp Source: Oral (07/08 0800) BP: 125/99 (07/08 0900) Pulse Rate: 88 (07/08 1000)  Labs: Recent Labs    05/13/18 0442 05/14/18 0411 05/15/18 0504 05/15/18 1053  HGB 10.9*  --   --  11.5*  HCT 32.9*  --   --  34.8*  PLT 236  --   --  221  HEPARINUNFRC 0.39 0.37 0.33 0.32  CREATININE 2.83*  --   --  2.41*    Estimated Creatinine Clearance: 58.4 mL/min (A) (by C-G formula based on SCr of 2.41 mg/dL (H)).   Medical History: Past Medical History:  Diagnosis Date  . Allergy   . Anxiety   . Arrhythmia   . Arthritis   . Atrial fibrillation (HCC)   . CHF (congestive heart failure) (HCC)   . CKD (chronic kidney disease)   . Diabetes mellitus without complication (HCC)   . Diabetes mellitus, type II (HCC)   . Heart failure (HCC)   . Hyperlipidemia   . Hypertension   . Pancreatitis   . Sleep apnea     Medications:  Scheduled:  . amLODipine  10 mg Per Tube Daily  . budesonide (PULMICORT) nebulizer solution  0.25 mg Nebulization Q6H  . chlorhexidine  15 mL Mouth Rinse BID  . clonazePAM  1 mg Per Tube Daily  . clonazePAM  2 mg Per Tube QHS  . diltiazem  60 mg Oral Q6H  . feeding supplement (PRO-STAT SUGAR FREE 64)  30 mL Per Tube Daily  . fentaNYL  150 mcg Transdermal Q72H  . fluvoxaMINE  25 mg Per Tube QHS  . free water  250 mL Per Tube Q6H  . gabapentin  600 mg Per Tube TID  . insulin aspart  0-20 Units Subcutaneous Q4H  . ipratropium-albuterol  3 mL Nebulization Q6H  . mouth rinse  15 mL Mouth Rinse 10 times per day  . metoprolol tartrate  50  mg Per Tube BID  . sodium chloride flush  10-40 mL Intracatheter Q12H  . Warfarin - Pharmacist Dosing Inpatient   Does not apply q1800   Infusions:  . erythromycin    . famotidine (PEPCID) IV    . feeding supplement (NEPRO CARB STEADY) Stopped (05/15/18 1112)  . heparin 1,700 Units/hr (05/15/18 1000)    Assessment: Pharmacy consulted for warfarin and heparin drip management for 47 yo male admitted to the ICU s/p cardiac arrest. Patient has history significant for aortic valve replacement, atrial fibrillation and CHF. Head CT is negative for infarction or hemorrhage.   Patient takes warfarin 5mg  daily as an outpatient for goal INR 2.5-3.5.   Goal of Therapy:  INR 2-3 Heparin level 0.3-0.7 units/ml Monitor platelets by anticoagulation protocol: Yes   Plan:  Continue heparin at 1700 units/hr. Due to increase in rate will obtain confirmatory heparin level at 1700.   Warfarin planned to be initiated today. Patient no longer tolerating tube feeds. Will hold warfarin today and plan to start warfarin on 7/9.   Pharmacy will continue to monitor and adjust per consult.     MLS 05/15/2018

## 2018-05-15 NOTE — Progress Notes (Signed)
Pt vomited what appeared to be tube feeds today. Dr. Belia HemanKasa aware and stated to hold tube feeds and meds via tube until 7/9 and reassess. Will continue to monitor.

## 2018-05-15 NOTE — Progress Notes (Signed)
[facility] [place] [facility]   PATIENT NAME: Alexander Alexander    MR#:  [id number]  DATE OF BIRTH:  [birth year]  SUBJECTIVE:   Pt. S/p Trach on vent and on 45% fio2.  No acute events overnight. Pt. Had some vomiting this a.m. And TF are stopped. ?? Constipation as no BM in past 4-5 days and will start some Laxatives.   REVIEW OF SYSTEMS:    Review of Systems  Constitutional: Negative for chills and fever.  HENT: Negative for congestion and tinnitus.   Eyes: Negative for blurred vision and double vision.  Respiratory: Negative for cough, shortness of breath and wheezing.   Cardiovascular: Negative for chest pain, orthopnea and PND.  Gastrointestinal: Negative for abdominal pain, diarrhea, nausea and vomiting.  Genitourinary: Negative for dysuria and hematuria.  Neurological: Negative for dizziness, sensory change and focal weakness.  All other systems reviewed and are negative.   Nutrition: Tube feeds  Tolerating Diet: No Tolerating PT: Await Eval.   DRUG ALLERGIES:   Allergies  Allergen Reactions  . Lexapro [Escitalopram Oxalate] Swelling    VITALS:  Blood pressure (!) 113/97, pulse (!) 102, temperature 97.9 F (36.6 C), temperature source Oral, resp. rate 11, height 5\' 10"  (1.778 m), weight (!) 168.7 kg (371 lb 14.7 oz), SpO2 99 %.  PHYSICAL EXAMINATION:   Physical Exam  GENERAL:  47 y.o.-year-old patient lying in bed s/p Trach on vent and follow simple commands EYES: Pupils equal, round, reactive to light. No scleral icterus. Extraocular muscles intact.  HEENT: Head atraumatic, normocephalic. Oropharynx and nasopharynx clear. S/p Trach and on vent.  Dobhoff tube in place  NECK:  Supple, no jugular venous distention. No thyroid enlargement, no tenderness.  LUNGS: Normal breath sounds bilaterally, no wheezing, rales, rhonchi. No use of accessory muscles of respiration.  CARDIOVASCULAR: S1, S2 RRR, + Metallic valve click. No murmurs, rubs, or gallops.   ABDOMEN: Soft, nontender, nondistended. Bowel sounds present. No organomegaly or mass.  EXTREMITIES: No cyanosis, clubbing or edema b/l.    NEUROLOGIC: Cranial nerves II through XII are intact. No focal Motor or sensory deficits b/l. Globally weak  PSYCHIATRIC: The patient is alert and oriented x 1. SKIN: No obvious rash, lesion, or ulcer.    LABORATORY PANEL:   CBC Recent Labs  Lab 05/15/18 1053  WBC 6.0  HGB 11.5*  HCT 34.8*  PLT 221   ------------------------------------------------------------------------------------------------------------------  Chemistries  Recent Labs  Lab 05/09/18 0534  05/13/18 0442 05/15/18 1053  NA 137   < > 136 134*  K 5.1   < > 4.6 4.7  CL 91*   < > 91* 91*  CO2 38*   < > 36* 34*  GLUCOSE 142*   < > 113* 166*  BUN 57*   < > 80* 70*  CREATININE 2.28*   < > 2.83* 2.41*  CALCIUM 9.0   < > 9.3 9.5  MG  --   --  2.4  --   AST 22  --   --   --   ALT 21  --   --   --   ALKPHOS 64  --   --   --   BILITOT 0.9  --   --   --    < > = values in this interval not displayed.   ------------------------------------------------------------------------------------------------------------------  Cardiac Enzymes No results for input(s): TROPONINI in the last 168 hours. ------------------------------------------------------------------------------------------------------------------  RADIOLOGY:  No results found.   ASSESSMENT AND PLAN:  47 year old male with past medical history significant for A. fib status post ablation, aortic valve replacement on warfarin, history of congestive heart failure, CKD, morbid obesity, diabetes and sleep apnea who was getting an outpatient MRI of his lower back had a cardiac arrest.  * Acute cardiopulmonary arrest  - Pulseless VT Difficult weaning from vent,s/p tracheostomy  Continuemechanical ventilation with weaning as per Intensivist.   * AcuteMRSA/VAP Treated withcourse ofLinezolid Continue trach  collar/vent protocol  - afebrile/hemodynamically stable.   *Atrial fibrillationwith RVR - rate controlled.  - Continuemetoprolol,Cardizem.  - cont. Heparin gtt given pt. Hx of Mechanical aortic valve placement.   * History of mechanical aortic valve replacement - cont. Heparin gtt.   * Acute toxic metabolic encephalopathy  - Neurology input appreciated  - initially Started on Kepprafor possible partial seizures. Later stopped.  * Chronic diabetes mellitus type 2 - cont. SSI. BS stable.   * Acute renal failurew/ CKD stage III Secondary toATN, Cr. Close to baseline now.   * N/V - pt. Was vomiting his tube feeds which have been stopped.  Possibly related to constipation, laxatives started and will follow response.   All the records are reviewed and case discussed with Care Management/Social Worker. Management plans discussed with the patient, family and they are in agreement.  CODE STATUS: full code  DVT Prophylaxis: Heparin gtt  TOTAL TIME TAKING CARE OF THIS PATIENT: 30 minutes.   POSSIBLE D/C unclear, DEPENDING ON CLINICAL CONDITION and progress.    Houston SirenSAINANI,VIVEK J M.D on 05/15/2018 at 3:38 PM  Between 7am to 6pm - Pager - [phone]  After 6pm go to [email or web address] - Social research officer, governmentpassword EPAS [facility]  [facility]  Office  [phone]  CC: Primary care physician; Erasmo DownerBacigalupo, Angela M, MD

## 2018-05-15 NOTE — Progress Notes (Signed)
Inpatient Diabetes Program Recommendations  AACE/ADA: New Consensus Statement on Inpatient Glycemic Control (2015)  Target Ranges:  Prepandial:   less than 140 mg/dL      Peak postprandial:   less than 180 mg/dL (1-2 hours)      Critically ill patients:  140 - 180 mg/dL   Lab Results  Component Value Date   GLUCAP 217 (H) 05/15/2018   HGBA1C 11.7 (H) 03/29/2018    Review of Glycemic ControlResults for [name], Graiden LEON (MRN [id number]) as of 05/15/2018 11:27  Ref. Range 05/14/2018 15:46 05/14/2018 19:41 05/14/2018 23:45 05/15/2018 03:55 05/15/2018 07:21  Glucose-Capillary Latest Ref Range: 70 - 99 mg/dL 469245 (H) 629196 (H) 528174 (H) 221 (H) 217 (H)    Diabetes history: Type 2 DM Outpatient Diabetes medications: Amaryl 2 mg daily, Levemir 65 units bid, Novolog 40 units tid with meals Current orders for Inpatient glycemic control:  Novolog resistant q 4 hours, Nepro 65 cc/ hr  Inpatient Diabetes Program Recommendations:  Note blood sugars reduced when tube feeds held/ rate reduced.  Tube feeds back a goal rate.  May consider restarting Levemir 15 units bid.    Thanks,  Beryl MeagerJenny Douglas Rooks, RN, BC-ADM Inpatient Diabetes Coordinator Pager [phone] (8a-5p)

## 2018-05-15 NOTE — Progress Notes (Signed)
ANTICOAGULATION CONSULT NOTE   Pharmacy Consult for heparin drip management Indication: atrial fibrillation and aortic valve replacement   Allergies  Allergen Reactions  . Lexapro [Escitalopram Oxalate] Swelling    Patient Measurements: Height: 5\' 10"  (177.8 cm) Weight: (!) 369 lb 7.9 oz (167.6 kg) IBW/kg (Calculated) : 73 Heparin Dosing Weight: 121 kg (updated 6/21)  Vital Signs: Temp: 98.8 F (37.1 C) (07/08 0400) Temp Source: Oral (07/08 0400) BP: 108/88 (07/08 0600) Pulse Rate: 79 (07/08 0600)  Labs: Recent Labs    05/13/18 0442 05/14/18 0411 05/15/18 0504  HGB 10.9*  --   --   HCT 32.9*  --   --   PLT 236  --   --   HEPARINUNFRC 0.39 0.37 0.33  CREATININE 2.83*  --   --     Estimated Creatinine Clearance: 49.5 mL/min (A) (by C-G formula based on SCr of 2.83 mg/dL (H)).   Medical History: Past Medical History:  Diagnosis Date  . Allergy   . Anxiety   . Arrhythmia   . Arthritis   . Atrial fibrillation (HCC)   . CHF (congestive heart failure) (HCC)   . CKD (chronic kidney disease)   . Diabetes mellitus without complication (HCC)   . Diabetes mellitus, type II (HCC)   . Heart failure (HCC)   . Hyperlipidemia   . Hypertension   . Pancreatitis   . Sleep apnea     Medications:  Scheduled:  . amLODipine  10 mg Per Tube Daily  . budesonide (PULMICORT) nebulizer solution  0.25 mg Nebulization Q6H  . chlorhexidine  15 mL Mouth Rinse BID  . clonazePAM  1 mg Per Tube Daily  . clonazePAM  2 mg Per Tube QHS  . diltiazem  60 mg Oral Q6H  . famotidine  20 mg Per Tube Daily  . feeding supplement (PRO-STAT SUGAR FREE 64)  30 mL Per Tube Daily  . fentaNYL  150 mcg Transdermal Q72H  . fluvoxaMINE  25 mg Per Tube QHS  . free water  250 mL Per Tube Q6H  . gabapentin  600 mg Per Tube TID  . insulin aspart  0-20 Units Subcutaneous Q4H  . ipratropium-albuterol  3 mL Nebulization Q6H  . mouth rinse  15 mL Mouth Rinse 10 times per day  . metoprolol tartrate  50 mg  Per Tube BID  . sodium chloride flush  10-40 mL Intracatheter Q12H   Infusions:  . feeding supplement (NEPRO CARB STEADY) 1,000 mL (05/14/18 2155)  . heparin 1,650 Units/hr (05/15/18 0600)    Assessment: Pharmacy consulted for warfarin and heparin drip management for 47 yo male admitted to the ICU s/p cardiac arrest. Patient has history significant for aortic valve replacement, atrial fibrillation and CHF. Head CT is negative for infarction or hemorrhage.   Patient takes warfarin 5mg  daily as an outpatient for goal INR 2.5-3.5.   Goal of Therapy:  INR 2-3 Heparin level 0.3-0.7 units/ml Monitor platelets by anticoagulation protocol: Yes   Plan:  07/08 @ 0500 HL 0.33 therapeutic, but has been declining. Will increase rate slightly to 1700 units/hr and will recheck anti-Xa @ 1100. Will f/u w/ CBC w/ am labs.  Thomasene Rippleavid Alaa Eyerman, PharmD, BCPS Clinical Pharmacist 05/15/2018

## 2018-05-15 NOTE — Progress Notes (Signed)
Nutrition Follow-up  DOCUMENTATION CODES:   Morbid obesity  INTERVENTION:  Recommend restarting Nepro at 20 mL/hr in AM and assessing tolerance. If patient still does not tolerate tube feeds recommend placing small-bore feeding tube that terminates in jejunum.  Once patient is tolerating trickle rate, recommend advancing by 15 mL/hr every 8 hours to goal regimen of Nepro at 65 mL/hr (1560 mL goal daily volume) + Pro-Stat 30 mL once daily via NGT. Provides 2908 kcal, 141 grams of protein, 251 grams of carbohydrates, 20 grams of fiber, 1654 mg potassium, 1123 mg phosphorus, and 1139 mL H2O daily.  Continue free water flush of 250 mL Q6hrs. Provides a total of 2139 mL H2O daily including water in tube feeding.  NUTRITION DIAGNOSIS:   Inadequate oral intake related to inability to eat as evidenced by NPO status.  Ongoing - addressing with TF regimen.  GOAL:   Provide needs based on ASPEN/SCCM guidelines  Met with TF regimen.  MONITOR:   Vent status, Labs, Weight trends, Skin, TF tolerance, I & O's  REASON FOR ASSESSMENT:   Ventilator, Consult Enteral/tube feeding initiation and management  ASSESSMENT:   47 year old male with PMHx of A-fib s/p ablation and mechanical aortic valve replacement on warfarin, HTN, DM type 2, CHF, OSA, anxiety, CKD, HLD, pancreatitis who presented with lower back pain and sciatica. While in MRI on 5/31 suite patient suffered pulseless V. tach cardiac arrest with 5 minutes ACLS before ROSC obtained, and was intubated.   -Patient s/p placement of tracheostomy tube on 6/18. OGT was removed that day and replaced with an NGT. -PEG tube was unable to be placed on 6/21 due to inability to find good one-to-one indentation and transillumination for safe placement. -Rectal tube was removed on 6/22. -IR attempted to place G-tube on 6/27. Tube was unable to be placed as a safe percutaneous window could not be identified. -Per chart patient is not a good candidate  for surgically placed G-tube. -Patient vomited on 7/4 and tube feeds were held. -He was also placed back on ventilator overnight on 7/4 due to abnormal ABG and increased somnolence. -Tube feeds were restarted on 7/5 and on 7/6 began advancing towards goal. Reached goal on 7/7.  Patient went back on ventilator overnight. After vomiting patient's tube feeds were held again. Plan is to start erythromycin today, give patient a suppository, and put NGT to LIS for decompression.   Access: NGT placed 6/18; terminates in stomach per abdominal x-ray 6/27; 65 cm at right nare  TF: tube feeds were running at goal rate this AM, but pt vomited shortly after rounds and tube feeds were then held  Patient is currently intubated on ventilator support Ve: 5.1 L/min Temp (24hrs), Avg:98.4 F (36.9 C), Min:97.9 F (36.6 C), Max:99 F (37.2 C)  Propofol: N/A  Medications reviewed and include: Xanax BID per tube, diazepam IV once today, diltiazem, fentanyl patch, free water flush 250 mL Q6hrs, gabapentin, Novolog 0-20 units Q4hrs, warfarin, erythromycin 500 mg Q8hrs IV, famotidine, heparin gtt.  Labs reviewed: CBG 161-245 past 24 hrs, Sodium 134, Chloride 91, CO2 34, BUN 70, Creatinine 2.41.  I/O: 1950 mL UOP yesterday (0.5 mL/kg/hr)  Weight trend: 168.7 kg on 7/8; now only +0.9 kg from admission  Discussed with RN and on rounds.  Diet Order:   Diet Order           Diet NPO time specified  Diet effective midnight          EDUCATION NEEDS:  No education needs have been identified at this time  Skin:  Skin Assessment: Skin Integrity Issues: Skin Integrity Issues:: Incisions Stage II: N/A Incisions: closed incision to neck  Last BM:  05/11/2018 - medium type 6  Height:   Ht Readings from Last 1 Encounters:  05/08/18 5' 10"  (1.778 m)    Weight:   Wt Readings from Last 1 Encounters:  05/15/18 (!) 371 lb 14.7 oz (168.7 kg)    Ideal Body Weight:  75.5 kg  BMI:  Body mass index is  53.36 kg/m.  Estimated Nutritional Needs:   Kcal:  4917-9150 (MSJ x 1.1-1.2)  Protein:  140-170 grams (1.85-2.25 grams/kg IBW; 0.8-1 grams/kg admission wt)  Fluid:  1.8-2.3 L/day (25-30 mL/kg IBW)  Willey Blade, MS, RD, LDN Office: [phone] Pager: [phone] After Hours/Weekend Pager: [phone]

## 2018-05-16 ENCOUNTER — Inpatient Hospital Stay: Payer: Medicare HMO

## 2018-05-16 LAB — COMPREHENSIVE METABOLIC PANEL
ALK PHOS: 62 U/L (ref 38–126)
ALT: 23 U/L (ref 0–44)
ANION GAP: 9 (ref 5–15)
AST: 28 U/L (ref 15–41)
Albumin: 3.1 g/dL — ABNORMAL LOW (ref 3.5–5.0)
BUN: 58 mg/dL — ABNORMAL HIGH (ref 6–20)
CO2: 34 mmol/L — ABNORMAL HIGH (ref 22–32)
Calcium: 9.3 mg/dL (ref 8.9–10.3)
Chloride: 93 mmol/L — ABNORMAL LOW (ref 98–111)
Creatinine, Ser: 2.22 mg/dL — ABNORMAL HIGH (ref 0.61–1.24)
GFR, EST AFRICAN AMERICAN: 38 mL/min — AB (ref >60)
GFR, EST NON AFRICAN AMERICAN: 33 mL/min — AB (ref >60)
Glucose, Bld: 91 mg/dL (ref 70–99)
Potassium: 4.5 mmol/L (ref 3.5–5.1)
SODIUM: 136 mmol/L (ref 135–145)
Total Bilirubin: 1.2 mg/dL (ref 0.3–1.2)
Total Protein: 8.4 g/dL — ABNORMAL HIGH (ref 6.5–8.1)

## 2018-05-16 LAB — GLUCOSE, CAPILLARY
GLUCOSE-CAPILLARY: 127 mg/dL — AB (ref 70–99)
GLUCOSE-CAPILLARY: 133 mg/dL — AB (ref 70–99)
GLUCOSE-CAPILLARY: 92 mg/dL (ref 70–99)
Glucose-Capillary: 101 mg/dL — ABNORMAL HIGH (ref 70–99)
Glucose-Capillary: 164 mg/dL — ABNORMAL HIGH (ref 70–99)
Glucose-Capillary: 163 mg/dL — ABNORMAL HIGH (ref 70–99)
Glucose-Capillary: 171 mg/dL — ABNORMAL HIGH (ref 70–99)

## 2018-05-16 LAB — PROTIME-INR
INR: 1.32
Prothrombin Time: 16.3 seconds — ABNORMAL HIGH (ref 11.4–15.2)

## 2018-05-16 LAB — PHOSPHORUS: PHOSPHORUS: 3.3 mg/dL (ref 2.5–4.6)

## 2018-05-16 LAB — HEPARIN LEVEL (UNFRACTIONATED): HEPARIN UNFRACTIONATED: 0.38 [IU]/mL (ref 0.30–0.70)

## 2018-05-16 LAB — MAGNESIUM: MAGNESIUM: 2.1 mg/dL (ref 1.7–2.4)

## 2018-05-16 MED ORDER — MIDAZOLAM HCL 2 MG/2ML IJ SOLN
2.0000 mg | Freq: Once | INTRAMUSCULAR | Status: AC
  Administered 2018-05-16: 2 mg via INTRAVENOUS

## 2018-05-16 MED ORDER — CHLORHEXIDINE GLUCONATE CLOTH 2 % EX PADS
6.0000 | MEDICATED_PAD | Freq: Every day | CUTANEOUS | Status: DC
  Administered 2018-05-17 – 2018-05-20 (×3): 6 via TOPICAL

## 2018-05-16 MED ORDER — NEPRO/CARBSTEADY PO LIQD
1000.0000 mL | ORAL | Status: DC
  Administered 2018-05-16 – 2018-05-19 (×3): 1000 mL via ORAL

## 2018-05-16 MED ORDER — SENNOSIDES-DOCUSATE SODIUM 8.6-50 MG PO TABS
2.0000 | ORAL_TABLET | Freq: Two times a day (BID) | ORAL | Status: DC
  Administered 2018-05-16 – 2018-05-24 (×5): 2
  Filled 2018-05-16 (×8): qty 2

## 2018-05-16 MED ORDER — MIDAZOLAM HCL 2 MG/2ML IJ SOLN
2.0000 mg | Freq: Once | INTRAMUSCULAR | Status: AC
  Administered 2018-05-16: 2 mg via INTRAVENOUS
  Filled 2018-05-16: qty 2

## 2018-05-16 MED ORDER — MIDAZOLAM HCL 2 MG/2ML IJ SOLN
INTRAMUSCULAR | Status: AC
  Administered 2018-05-16: 2 mg via INTRAVENOUS
  Filled 2018-05-16: qty 2

## 2018-05-16 MED ORDER — MUPIROCIN 2 % EX OINT
1.0000 "application " | TOPICAL_OINTMENT | Freq: Two times a day (BID) | CUTANEOUS | Status: AC
  Administered 2018-05-16 – 2018-05-20 (×10): 1 via NASAL
  Filled 2018-05-16: qty 22

## 2018-05-16 NOTE — Progress Notes (Signed)
During rounds Dr. Belia HemanKasa gave order to place dobhoff tube and remove NG tube.

## 2018-05-16 NOTE — Progress Notes (Signed)
Per Dr. Belia HemanKasa it is okay for patient's wife to bring hard candy for patient to suck on to help moisten his mouth.

## 2018-05-16 NOTE — Progress Notes (Signed)
RN spoke with patient's wife on the phone about dobhoff tube placement.  Mrs. Bice expressed that she would like nursing staff to hold off on tube placement and wait until this afternoon around 4:30 pm when she comes back to discuss this with her husband and so that she can be here for emotional support.

## 2018-05-16 NOTE — Progress Notes (Signed)
Dr. Belia HemanKasa gave order to restart tube feeds at 25 ml/H.

## 2018-05-16 NOTE — Progress Notes (Signed)
PATIENT NAME: Alexander Alexander    MR#:  [id number]  DATE OF BIRTH:  [birth year]  SUBJECTIVE:   Pt. S/p Trach on vent and on 45% fio2.  Pt. Had some vomiting  Laxatives started .   REVIEW OF SYSTEMS:    Review of Systems  Constitutional: Negative for chills and fever.  HENT: Negative for congestion and tinnitus.   Eyes: Negative for blurred vision and double vision.  Respiratory: Negative for cough, shortness of breath and wheezing.   Cardiovascular: Negative for chest pain, orthopnea and PND.  Gastrointestinal: Negative for abdominal pain, diarrhea, nausea and vomiting.  Genitourinary: Negative for dysuria and hematuria.  Neurological: Negative for dizziness, sensory change and focal weakness.  All other systems reviewed and are negative.   Nutrition: Tube feeds  Tolerating Diet: No Tolerating PT: Await Eval.    VITALS:  Blood pressure 114/82, pulse 97, temperature 98.8 F (37.1 C), temperature source Oral, resp. rate (!) 24, height 5\' 10"  (1.778 m), weight (!) 354 lb 0.9 oz (160.6 kg), SpO2 100 %.  PHYSICAL EXAMINATION:   Physical Exam  GENERAL:  47 y.o.-year-old patient lying in bed s/p Trach on vent and follow simple commands EYES: Pupils equal, round, reactive to light. No scleral icterus. Extraocular muscles intact.  HEENT: Head atraumatic, normocephalic. Oropharynx and nasopharynx clear. S/p Trach and on vent.  Dobhoff tube in place  NECK:  Supple, no jugular venous distention. No thyroid enlargement, no tenderness.  LUNGS: Normal breath sounds bilaterally, no wheezing, rales, rhonchi. No use of accessory muscles of respiration.  CARDIOVASCULAR: S1, S2 RRR, + Metallic valve click. No murmurs, rubs, or gallops.  ABDOMEN: Soft, nontender, nondistended. Bowel sounds present. No organomegaly or mass.  EXTREMITIES: No cyanosis, clubbing or edema b/l.    NEUROLOGIC: Cranial nerves II through XII are intact. No focal Motor or sensory deficits b/l. Globally weak  PSYCHIATRIC:  The patient is alert and oriented x 1. SKIN: No obvious rash, lesion, or ulcer.    LABORATORY PANEL:   CBC Recent Labs  Lab 05/15/18 1053  WBC 6.0  HGB 11.5*  HCT 34.8*  PLT 221   ------------------------------------------------------------------------------------------------------------------  Chemistries  Recent Labs  Lab 05/16/18 0449  NA 136  K 4.5  CL 93*  CO2 34*  GLUCOSE 91  BUN 58*  CREATININE 2.22*  CALCIUM 9.3  MG 2.1  AST 28  ALT 23  ALKPHOS 62  BILITOT 1.2   ------------------------------------------------------------------------------------------------------------------  Cardiac Enzymes No results for input(s): TROPONINI in the last 168 hours. ------------------------------------------------------------------------------------------------------------------  RADIOLOGY:  No results found.   ASSESSMENT AND PLAN:   47 year old male with past medical history significant for A. fib status post ablation, aortic valve replacement on warfarin, history of congestive heart failure, CKD, morbid obesity, diabetes and sleep apnea who was getting an outpatient MRI of his lower back had a cardiac arrest.  1. s/p Acute cardiopulmonary arrest  Pulseless VT Difficult weaning from vent,s/p tracheostomy  Continuemechanical ventilation with weaning   2.Atrial fibrillationwith RVR - rate controlled.  - Continuemetoprolol,Cardizem.  - cont. Heparin gtt given pt. Hx of Mechanical aortic valve placement.  Coumadin on hold for not tolerating TF's  3.History of mechanical aortic valve replacement - cont. Heparin gtt.   4. Acute renal failurew/ CKD stage III Secondary toATN, Cr. Close to baseline now.   5. N/V - pt. Was vomiting his tube feeds which have been stopped.  Possibly related to constipation, laxatives started and will follow response    Lucie Leather, M.D.  [facility]  Medical Director [facility]  [facility] Medical Director [facility]

## 2018-05-16 NOTE — Care Management (Signed)
Written [employer] Medicare appeal signed by MD and faxed back to Clydie BraunKaren with [employer] [phone] pending [employer] review for LTAC. RNCM has updated wife Rosey Batheresa.

## 2018-05-16 NOTE — Progress Notes (Signed)
Dr. Belia HemanKasa gave order that patient can chew gum in addition to having hard candy to suck on.

## 2018-05-16 NOTE — Progress Notes (Signed)
ANTICOAGULATION CONSULT NOTE   Pharmacy Consult for heparin drip management Indication: atrial fibrillation and aortic valve replacement   Allergies  Allergen Reactions  . Lexapro [Escitalopram Oxalate] Swelling    Patient Measurements: Height: 5\' 10"  (177.8 cm) Weight: (!) 354 lb 0.9 oz (160.6 kg) IBW/kg (Calculated) : 73 Heparin Dosing Weight: 121 kg (updated 6/21)  Vital Signs: Temp: 99 F (37.2 C) (07/09 0400) Temp Source: Oral (07/09 0400) BP: 124/83 (07/09 0600) Pulse Rate: 102 (07/09 0600)  Labs: Recent Labs    05/15/18 1053 05/15/18 1753 05/16/18 0449  HGB 11.5*  --   --   HCT 34.8*  --   --   PLT 221  --   --   LABPROT  --   --  16.3*  INR  --   --  1.32  HEPARINUNFRC 0.32 0.43 0.38  CREATININE 2.41*  --  2.22*    Estimated Creatinine Clearance: 61.5 mL/min (A) (by C-G formula based on SCr of 2.22 mg/dL (H)).   Medical History: Past Medical History:  Diagnosis Date  . Allergy   . Anxiety   . Arrhythmia   . Arthritis   . Atrial fibrillation (HCC)   . CHF (congestive heart failure) (HCC)   . CKD (chronic kidney disease)   . Diabetes mellitus without complication (HCC)   . Diabetes mellitus, type II (HCC)   . Heart failure (HCC)   . Hyperlipidemia   . Hypertension   . Pancreatitis   . Sleep apnea     Medications:  Scheduled:  . ALPRAZolam  0.5 mg Per Tube BID  . amLODipine  10 mg Per Tube Daily  . budesonide (PULMICORT) nebulizer solution  0.25 mg Nebulization Q6H  . chlorhexidine  15 mL Mouth Rinse BID  . diltiazem  60 mg Oral Q6H  . feeding supplement (PRO-STAT SUGAR FREE 64)  30 mL Per Tube Daily  . fentaNYL  150 mcg Transdermal Q72H  . fluvoxaMINE  25 mg Per Tube QHS  . free water  250 mL Per Tube Q6H  . gabapentin  600 mg Per Tube TID  . insulin aspart  0-20 Units Subcutaneous Q4H  . ipratropium-albuterol  3 mL Nebulization Q6H  . mouth rinse  15 mL Mouth Rinse 10 times per day  . metoprolol tartrate  50 mg Per Tube BID  . sodium  chloride flush  10-40 mL Intracatheter Q12H  . Warfarin - Pharmacist Dosing Inpatient   Does not apply q1800   Infusions:  . erythromycin 500 mg (05/16/18 0630)  . famotidine (PEPCID) IV Stopped (05/15/18 2136)  . feeding supplement (NEPRO CARB STEADY) Stopped (05/15/18 1112)  . heparin 1,700 Units/hr (05/16/18 0600)    Assessment: Pharmacy consulted for warfarin and heparin drip management for 47 yo male admitted to the ICU s/p cardiac arrest. Patient has history significant for aortic valve replacement, atrial fibrillation and CHF. Head CT is negative for infarction or hemorrhage.   Patient takes warfarin 5mg  daily as an outpatient for goal INR 2.5-3.5.   Goal of Therapy:  INR 2-3 Heparin level 0.3-0.7 units/ml Monitor platelets by anticoagulation protocol: Yes   Plan:  Continue heparin at 1700 units/hr. Will obtain heparin level with am labs.   Warfarin planned to be initiated today. Patient no longer tolerating tube feeds. Will hold warfarin today and plan to start warfarin on 7/9.   Pharmacy will continue to monitor and adjust per consult.     7/9:  HL @ 0449 = 0.38 Will continue this  pt on current rate and recheck HL on 7/10 with AM labs.   Alexander Alexander 05/16/2018

## 2018-05-16 NOTE — Progress Notes (Signed)
RN spoke with Dr. Sung AmabileSimonds and made MD aware that patient is asking to be sedated for Dobhoff tube placement.  MD gave order for 2 mg of versed and may repeat times one more dose if needed during dobhoff placement.

## 2018-05-16 NOTE — Progress Notes (Signed)
Meds given via NG tube but tube became clogged and some meds came out onto patient's gown.  Tube unclogged at this time.

## 2018-05-16 NOTE — Progress Notes (Signed)
ANTICOAGULATION CONSULT NOTE   Pharmacy Consult for heparin drip management Indication: atrial fibrillation and aortic valve replacement   Allergies  Allergen Reactions  . Lexapro [Escitalopram Oxalate] Swelling    Patient Measurements: Height: 5\' 10"  (177.8 cm) Weight: (!) 354 lb 0.9 oz (160.6 kg) IBW/kg (Calculated) : 73 Heparin Dosing Weight: 121 kg (updated 6/21)  Vital Signs: Temp: 98.9 F (37.2 C) (07/09 1200) Temp Source: Oral (07/09 1200) BP: 109/78 (07/09 1300) Pulse Rate: 91 (07/09 1300)  Labs: Recent Labs    05/15/18 1053 05/15/18 1753 05/16/18 0449  HGB 11.5*  --   --   HCT 34.8*  --   --   PLT 221  --   --   LABPROT  --   --  16.3*  INR  --   --  1.32  HEPARINUNFRC 0.32 0.43 0.38  CREATININE 2.41*  --  2.22*    Estimated Creatinine Clearance: 61.5 mL/min (A) (by C-G formula based on SCr of 2.22 mg/dL (H)).   Medical History: Past Medical History:  Diagnosis Date  . Allergy   . Anxiety   . Arrhythmia   . Arthritis   . Atrial fibrillation (HCC)   . CHF (congestive heart failure) (HCC)   . CKD (chronic kidney disease)   . Diabetes mellitus without complication (HCC)   . Diabetes mellitus, type II (HCC)   . Heart failure (HCC)   . Hyperlipidemia   . Hypertension   . Pancreatitis   . Sleep apnea     Medications:  Scheduled:  . ALPRAZolam  0.5 mg Per Tube BID  . amLODipine  10 mg Per Tube Daily  . budesonide (PULMICORT) nebulizer solution  0.25 mg Nebulization Q6H  . chlorhexidine  15 mL Mouth Rinse BID  . Chlorhexidine Gluconate Cloth  6 each Topical Q0600  . diltiazem  60 mg Oral Q6H  . feeding supplement (PRO-STAT SUGAR FREE 64)  30 mL Per Tube Daily  . fentaNYL  150 mcg Transdermal Q72H  . fluvoxaMINE  25 mg Per Tube QHS  . free water  250 mL Per Tube Q6H  . gabapentin  600 mg Per Tube TID  . insulin aspart  0-20 Units Subcutaneous Q4H  . ipratropium-albuterol  3 mL Nebulization Q6H  . mouth rinse  15 mL Mouth Rinse 10 times per  day  . metoprolol tartrate  50 mg Per Tube BID  . mupirocin ointment  1 application Nasal BID  . sodium chloride flush  10-40 mL Intracatheter Q12H  . Warfarin - Pharmacist Dosing Inpatient   Does not apply q1800   Infusions:  . erythromycin Stopped (05/16/18 0743)  . famotidine (PEPCID) IV Stopped (05/16/18 1140)  . feeding supplement (NEPRO CARB STEADY) 1,000 mL (05/16/18 0804)  . heparin 1,700 Units/hr (05/16/18 0600)    Assessment: Pharmacy consulted for warfarin and heparin drip management for 47 yo male admitted to the ICU s/p cardiac arrest. Patient has history significant for aortic valve replacement, atrial fibrillation and CHF. Head CT is negative for infarction or hemorrhage.   Patient takes warfarin 5mg  daily as an outpatient for goal INR 2.5-3.5.   Goal of Therapy:  INR 2-3 Heparin level 0.3-0.7 units/ml Monitor platelets by anticoagulation protocol: Yes   Plan:  Continue heparin at 1700 units/hr. Will obtain heparin level with am labs.   Will continue to hold warfarin, patient to have dobhoff tube placed this afternoon.   Pharmacy will continue to monitor and adjust per consult.     Simpson,Michael  L 05/16/2018

## 2018-05-16 NOTE — Progress Notes (Signed)
[facility] [place] at [place]   PATIENT NAME: Alexander Alexander    MR#:  [id number]  DATE OF BIRTH:  01/26/1971  SUBJECTIVE:   Pt. S/p Trach on vent and on 50% fio2.  Did not tolerate trach collar for long due to worsening hypercapnia and hypoxia.  Patient had a bowel movement yesterday, no further vomiting, tube feeds have been reinitiated at a lower rate and tolerating it well now.  Patient is alert and following commands on the vent.  For Dobbhoff tube placement for nutrition today.  REVIEW OF SYSTEMS:    Review of Systems  Constitutional: Negative for chills and fever.  HENT: Negative for congestion and tinnitus.   Eyes: Negative for blurred vision and double vision.  Respiratory: Negative for cough, shortness of breath and wheezing.   Cardiovascular: Negative for chest pain, orthopnea and PND.  Gastrointestinal: Negative for abdominal pain, diarrhea, nausea and vomiting.  Genitourinary: Negative for dysuria and hematuria.  Neurological: Negative for dizziness, sensory change and focal weakness.  All other systems reviewed and are negative.   Nutrition: Tube feeds  Tolerating Diet: No Tolerating PT: Await Eval.   DRUG ALLERGIES:   Allergies  Allergen Reactions  . Lexapro [Escitalopram Oxalate] Swelling    VITALS:  Blood pressure (!) 134/93, pulse (!) 101, temperature 98.9 F (37.2 C), temperature source Oral, resp. rate 15, height 5\' 10"  (1.778 m), weight (!) 160.6 kg (354 lb 0.9 oz), SpO2 100 %.  PHYSICAL EXAMINATION:   Physical Exam  GENERAL:  47 y.o.-year-old patient lying in bed s/p Trach on vent and follow simple commands EYES: Pupils equal, round, reactive to light. No scleral icterus. Extraocular muscles intact.  HEENT: Head atraumatic, normocephalic. Oropharynx and nasopharynx clear. S/p Trach and on vent.  NG tube in place  NECK:  Supple, no jugular venous distention. No thyroid enlargement, no tenderness.  LUNGS: Normal breath sounds  bilaterally, no wheezing, rales, rhonchi. No use of accessory muscles of respiration.  CARDIOVASCULAR: S1, S2 RRR, + Metallic valve click. No murmurs, rubs, or gallops.  ABDOMEN: Soft, nontender, nondistended. Bowel sounds present. No organomegaly or mass.  EXTREMITIES: No cyanosis, clubbing or edema b/l.    NEUROLOGIC: Cranial nerves II through XII are intact. No focal Motor or sensory deficits b/l. Globally weak  PSYCHIATRIC: The patient is alert and oriented x 3. SKIN: No obvious rash, lesion, or ulcer.    LABORATORY PANEL:   CBC Recent Labs  Lab 05/15/18 1053  WBC 6.0  HGB 11.5*  HCT 34.8*  PLT 221   ------------------------------------------------------------------------------------------------------------------  Chemistries  Recent Labs  Lab 05/16/18 0449  NA 136  K 4.5  CL 93*  CO2 34*  GLUCOSE 91  BUN 58*  CREATININE 2.22*  CALCIUM 9.3  MG 2.1  AST 28  ALT 23  ALKPHOS 62  BILITOT 1.2   ------------------------------------------------------------------------------------------------------------------  Cardiac Enzymes No results for input(s): TROPONINI in the last 168 hours. ------------------------------------------------------------------------------------------------------------------  RADIOLOGY:  No results found.   ASSESSMENT AND PLAN:   47 year old male with past medical history significant for A. fib status post ablation, aortic valve replacement on warfarin, history of congestive heart failure, CKD, morbid obesity, diabetes and sleep apnea who was getting an outpatient MRI of his lower back had a cardiac arrest.  * Acute cardiopulmonary arrest  - Pulseless VT - Difficult weaning from vent,s/p tracheostomy  - Continuemechanical ventilation with weaning as per Intensivist.   * AcuteMRSA/VAP Treated withcourse ofLinezolid - afebrile/hemodynamically stable.   *Atrial fibrillationwith RVR -  rate controlled.  -  Continuemetoprolol,Cardizem.  - cont. Heparin gtt given pt. Hx of Mechanical aortic valve placement.   * History of mechanical aortic valve replacement - cont. Heparin gtt.   * Acute toxic metabolic encephalopathy  - Neurology input appreciated  - initially Started on Kepprafor possible partial seizures. Later stopped. - mental status back to baseline, no acute seizures.   * Chronic diabetes mellitus type 2 - cont. SSI. BS stable.   * Acute renal failurew/ CKD stage III Secondary toATN, Cr. Close to baseline now.   * N/V -resolved.  Possibly due to constipation.  Patient given laxatives and had a bowel movement yesterday. - Tube feedings have been resumed and patient is tolerating at a lower rate now.  * nutrition -for Dobbhoff tube placement today as patient has NG tube.  Patient high risk for PEG tube placement as it was attempted but unsuccessful.  All the records are reviewed and case discussed with Care Management/Social Worker. Management plans discussed with the patient, family and they are in agreement.  CODE STATUS: full code  DVT Prophylaxis: Heparin gtt  TOTAL TIME TAKING CARE OF THIS PATIENT: 25 minutes.   POSSIBLE D/C unclear, DEPENDING ON CLINICAL CONDITION and progress.    Houston SirenSAINANI,VIVEK J M.D on 05/16/2018 at 3:03 PM  Between 7am to 6pm - Pager - [phone]  After 6pm go to [email or web address] - Social research officer, governmentpassword EPAS [facility]  [facility] [place] [facility]  Office  [phone]  CC: Primary care physician; Erasmo DownerBacigalupo, Angela M, MD

## 2018-05-17 ENCOUNTER — Inpatient Hospital Stay: Payer: Medicare HMO

## 2018-05-17 DIAGNOSIS — J9611 Chronic respiratory failure with hypoxia: Secondary | ICD-10-CM

## 2018-05-17 LAB — GLUCOSE, CAPILLARY
GLUCOSE-CAPILLARY: 165 mg/dL — AB (ref 70–99)
GLUCOSE-CAPILLARY: 166 mg/dL — AB (ref 70–99)
Glucose-Capillary: 141 mg/dL — ABNORMAL HIGH (ref 70–99)
Glucose-Capillary: 143 mg/dL — ABNORMAL HIGH (ref 70–99)
Glucose-Capillary: 115 mg/dL — ABNORMAL HIGH (ref 70–99)
Glucose-Capillary: 232 mg/dL — ABNORMAL HIGH (ref 70–99)

## 2018-05-17 LAB — BASIC METABOLIC PANEL
ANION GAP: 8 (ref 5–15)
BUN: 48 mg/dL — ABNORMAL HIGH (ref 6–20)
CALCIUM: 9.3 mg/dL (ref 8.9–10.3)
CO2: 36 mmol/L — ABNORMAL HIGH (ref 22–32)
Chloride: 94 mmol/L — ABNORMAL LOW (ref 98–111)
Creatinine, Ser: 2.37 mg/dL — ABNORMAL HIGH (ref 0.61–1.24)
GFR, EST AFRICAN AMERICAN: 35 mL/min — AB (ref >60)
GFR, EST NON AFRICAN AMERICAN: 31 mL/min — AB (ref >60)
Glucose, Bld: 121 mg/dL — ABNORMAL HIGH (ref 70–99)
POTASSIUM: 4.2 mmol/L (ref 3.5–5.1)
SODIUM: 138 mmol/L (ref 135–145)

## 2018-05-17 LAB — HEPARIN LEVEL (UNFRACTIONATED)
HEPARIN UNFRACTIONATED: 0.27 [IU]/mL — AB (ref 0.30–0.70)
Heparin Unfractionated: 0.58 IU/mL (ref 0.30–0.70)
Heparin Unfractionated: 0.63 IU/mL (ref 0.30–0.70)

## 2018-05-17 MED ORDER — HEPARIN BOLUS VIA INFUSION
1800.0000 [IU] | Freq: Once | INTRAVENOUS | Status: AC
  Administered 2018-05-17: 1800 [IU] via INTRAVENOUS
  Filled 2018-05-17: qty 1800

## 2018-05-17 MED ORDER — INSULIN ASPART 100 UNIT/ML ~~LOC~~ SOLN
SUBCUTANEOUS | Status: AC
  Administered 2018-05-17: 4 [IU] via SUBCUTANEOUS
  Filled 2018-05-17: qty 1

## 2018-05-17 MED ORDER — OXYCODONE-ACETAMINOPHEN 5-325 MG PO TABS
1.0000 | ORAL_TABLET | ORAL | Status: DC | PRN
  Administered 2018-05-19: 1

## 2018-05-17 MED ORDER — FENTANYL 100 MCG/HR TD PT72
100.0000 ug | MEDICATED_PATCH | TRANSDERMAL | Status: DC
  Administered 2018-05-18: 100 ug via TRANSDERMAL
  Filled 2018-05-17: qty 1

## 2018-05-17 MED ORDER — BISACODYL 10 MG RE SUPP
10.0000 mg | Freq: Once | RECTAL | Status: DC
  Filled 2018-05-17: qty 1

## 2018-05-17 NOTE — Progress Notes (Signed)
[facility] - [place] at [facility]   PATIENT NAME: Alexander Alexander    MR#:  [id number]  DATE OF BIRTH:  [birth year]  SUBJECTIVE:   Pt. S/p Trach , pt was on spontanous breathing earlier today  REVIEW OF SYSTEMS:    Review of Systems  Constitutional: Negative for chills and fever.  HENT: Negative for congestion and tinnitus.   Eyes: Negative for blurred vision and double vision.  Respiratory: Negative for cough, shortness of breath and wheezing.   Cardiovascular: Negative for chest pain, orthopnea and PND.  Gastrointestinal: Negative for abdominal pain, diarrhea, nausea and vomiting.  Genitourinary: Negative for dysuria and hematuria.  Neurological: Negative for dizziness, sensory change and focal weakness.  All other systems reviewed and are negative.   Nutrition: Tube feeds  Tolerating Diet: No Tolerating PT: Await Eval.   DRUG ALLERGIES:   Allergies  Allergen Reactions  . Lexapro [Escitalopram Oxalate] Swelling    VITALS:  Blood pressure 119/82, pulse 96, temperature 98.6 F (37 C), temperature source Axillary, resp. rate (!) 26, height 5\' 10"  (1.778 m), weight (!) 160.6 kg (354 lb 0.9 oz), SpO2 91 %.  PHYSICAL EXAMINATION:   Physical Exam  GENERAL:  47 y.o.-year-old patient lying in bed s/p Trach on vent and follow simple commands EYES: Pupils equal, round, reactive to light. No scleral icterus. Extraocular muscles intact.  HEENT: Head atraumatic, normocephalic. Oropharynx and nasopharynx clear. S/p Trach and on vent.  NG tube in place  NECK:  Supple, no jugular venous distention. No thyroid enlargement, no tenderness.  LUNGS: Normal breath sounds bilaterally, no wheezing, rales, rhonchi. No use of accessory muscles of respiration.  CARDIOVASCULAR: S1, S2 RRR, + Metallic valve click. No murmurs, rubs, or gallops.  ABDOMEN: Soft, nontender, nondistended. Bowel sounds present. No organomegaly or mass.  EXTREMITIES: No cyanosis, clubbing or edema b/l.     NEUROLOGIC: Cranial nerves II through XII are intact. No focal Motor or sensory deficits b/l. Globally weak  PSYCHIATRIC: The patient is alert and oriented x 3. SKIN: No obvious rash, lesion, or ulcer.    LABORATORY PANEL:   CBC Recent Labs  Lab 05/15/18 1053  WBC 6.0  HGB 11.5*  HCT 34.8*  PLT 221   ------------------------------------------------------------------------------------------------------------------  Chemistries  Recent Labs  Lab 05/16/18 0449 05/17/18 0511  NA 136 138  K 4.5 4.2  CL 93* 94*  CO2 34* 36*  GLUCOSE 91 121*  BUN 58* 48*  CREATININE 2.22* 2.37*  CALCIUM 9.3 9.3  MG 2.1  --   AST 28  --   ALT 23  --   ALKPHOS 62  --   BILITOT 1.2  --    ------------------------------------------------------------------------------------------------------------------  Cardiac Enzymes No results for input(s): TROPONINI in the last 168 hours. ------------------------------------------------------------------------------------------------------------------  RADIOLOGY:  Dg Abd 1 View  Result Date: 05/16/2018 CLINICAL DATA:  Evaluate feeding tube placement. EXAM: ABDOMEN - 1 VIEW COMPARISON:  None. FINDINGS: A feeding tube terminates in the left upper quadrant, likely in the body of the stomach. IMPRESSION: The feeding tube appears to terminate in the body of the stomach. Electronically Signed   By: Gerome Sam III M.D   On: 05/16/2018 19:22     ASSESSMENT AND PLAN:   47 year old male with past medical history significant for A. fib status post ablation, aortic valve replacement on warfarin, history of congestive heart failure, CKD, morbid obesity, diabetes and sleep apnea who was getting an outpatient MRI of his lower back had a cardiac arrest.  *  Acute cardiopulmonary arrest  - Pulseless VT - continue weaning from vent,s/p tracheostomy  - Continuemechanical ventilation with weaning as per Intensivist.   * AcuteMRSA/VAP Treated withcourse  ofLinezolid - afebrile/hemodynamically stable.   *Atrial fibrillationwith RVR - rate controlled.  - Continuemetoprolol,Cardizem.  - cont. Heparin gtt given pt. Hx of Mechanical aortic valve placement.   * History of mechanical aortic valve replacement - cont. Heparin gtt.   * Acute toxic metabolic encephalopathy  - Neurology input appreciated  - initially Started on Kepprafor possible partial seizures. Later stopped. - mental status back to baseline, no acute seizures.   * Chronic diabetes mellitus type 2 - cont. SSI. BS stable.   * Acute renal failurew/ CKD stage III Secondary toATN, Cr. Close to baseline now.   * N/V -resolved.  Possibly due to constipation.  Patient given laxatives and had a bowel movement yesterday. - Tube feedings have been resumed  * nutrition -had  Dobbhoff tube placement yest as patient has NG tube.  Patient high risk for PEG tube placement as it was attempted but unsuccessful.  All the records are reviewed and case discussed with Care Management/Social Worker. Management plans discussed with the patient, family and they are in agreement.  CODE STATUS: full code  DVT Prophylaxis: Heparin gtt  TOTAL TIME TAKING CARE OF THIS PATIENT: 25 minutes.   POSSIBLE D/C unclear, DEPENDING ON CLINICAL CONDITION and progress.    Auburn BilberryShreyang Arlee Santosuosso M.D on 05/17/2018 at 5:01 PM  Between 7am to 6pm - Pager - [phone]  After 6pm go to [email or web address] - Social research officer, governmentpassword EPAS [facility]  [facility] [place] [facility]  Office  [phone]  CC: Primary care physician; Erasmo DownerBacigalupo, Angela M, MD

## 2018-05-17 NOTE — Progress Notes (Signed)
PATIENT NAME: Alexander Alexander    MR#:  [id number]  DATE OF BIRTH:  11/17/1970  SUBJECTIVE:  NG replaced with dubhoff tube On PS mode  On Vent   REVIEW OF SYSTEMS:    Review of Systems  Constitutional: Negative for chills and fever.  HENT: Negative for congestion and tinnitus.   Eyes: Negative for blurred vision and double vision.  Respiratory: Negative for cough, shortness of breath and wheezing.   Cardiovascular: Negative for chest pain, orthopnea and PND.  Gastrointestinal: Negative for abdominal pain, diarrhea, nausea and vomiting.  Genitourinary: Negative for dysuria and hematuria.  Neurological: Negative for dizziness, sensory change and focal weakness.  All other systems reviewed and are negative.    VITALS:  Blood pressure (!) 122/94, pulse 94, temperature 98.2 F (36.8 C), temperature source Oral, resp. rate 19, height 5\' 10"  (1.778 m), weight (!) 354 lb 0.9 oz (160.6 kg), SpO2 (!) 88 %.  PHYSICAL EXAMINATION:   Physical Exam  GENERAL:  47 year old patient lying in bed s/p Trach on vent and follow simple commands EYES: Pupils equal, round, reactive to light. No scleral icterus. Extraocular muscles intact.  HEENT: Head atraumatic, normocephalic. Oropharynx and nasopharynx clear. S/p Trach and on vent.  Dobhoff tube in place  NECK:  Supple, no jugular venous distention. No thyroid enlargement, no tenderness.  LUNGS: Normal breath sounds bilaterally, no wheezing, rales, rhonchi. No use of accessory muscles of respiration.  CARDIOVASCULAR: S1, S2 RRR, + Metallic valve click. No murmurs, rubs, or gallops.  ABDOMEN: Soft, nontender, nondistended. Bowel sounds present. No organomegaly or mass.  EXTREMITIES: No cyanosis, clubbing or edema b/l.    NEUROLOGIC: Cranial nerves II through XII are intact. No focal Motor or sensory deficits b/l. Globally weak  PSYCHIATRIC: The patient is alert and oriented x 1. SKIN: No obvious rash, lesion, or ulcer.    LABORATORY PANEL:    CBC Recent Labs  Lab 05/15/18 1053  WBC 6.0  HGB 11.5*  HCT 34.8*  PLT 221   ------------------------------------------------------------------------------------------------------------------  Chemistries  Recent Labs  Lab 05/16/18 0449 05/17/18 0511  NA 136 138  K 4.5 4.2  CL 93* 94*  CO2 34* 36*  GLUCOSE 91 121*  BUN 58* 48*  CREATININE 2.22* 2.37*  CALCIUM 9.3 9.3  MG 2.1  --   AST 28  --   ALT 23  --   ALKPHOS 62  --   BILITOT 1.2  --    ------------------------------------------------------------------------------------------------------------------  Cardiac Enzymes No results for input(s): TROPONINI in the last 168 hours. ------------------------------------------------------------------------------------------------------------------  RADIOLOGY:  Dg Abd 1 View  Result Date: 05/16/2018 CLINICAL DATA:  Evaluate feeding tube placement. EXAM: ABDOMEN - 1 VIEW COMPARISON:  None. FINDINGS: A feeding tube terminates in the left upper quadrant, likely in the body of the stomach. IMPRESSION: The feeding tube appears to terminate in the body of the stomach. Electronically Signed   By: Gerome Sam III M.D   On: 05/16/2018 19:22     ASSESSMENT AND PLAN:  47 year old male with past medical history significant for A. fib status post ablation, aortic valve replacement history of congestive heart failure, CKD, morbid obesity, diabetes and sleep apnea who was getting an outpatient MRI of his lower back had a cardiac arrest.  1. s/p Acute cardiopulmonary arrest  Pulseless VT Difficult weaning from vent,s/p tracheostomy  Continuemechanical ventilation with weaning Plan for TCT as tolerated  2.Atrial fibrillationwith RVR - rate controlled.  - Continuemetoprolol,Cardizem.  - cont. Heparin gtt given pt. Hx of  Mechanical aortic valve placement.  Coumadin on hold for not tolerating TF's  3.History of mechanical aortic valve replacement - cont. Heparin gtt.    4. Acute renal failurew/ CKD stage III Secondary toATN, Cr. Close to baseline now.   Re-appeal LTAC process pending   Lucie LeatherKurian David Brodan Grewell, M.D.  [facility]  Medical Director [facility] Medical Director [facility] Card[facility]

## 2018-05-17 NOTE — Progress Notes (Signed)
Dobhoff migrated again, pt very mobile in bed and moving his hands around a great deal, he may have inadvertently dislodged the tube, it was visualized in patient's mouth, pt very upset and stated he felt some pain upon withdrawal, patient was tearful and stated he "can't stand this anymore."  Physical Therapist decided to try to see patient tomorrow d/t patient's distress.   Writer reported to Dr Belia HemanKasa, I do not believe patient will be able to tolerate reinsertion of feeding tube today.  Per Dr Belia HemanKasa we can wait until patient is in a better frame of mind and try again.

## 2018-05-17 NOTE — Progress Notes (Signed)
ANTICOAGULATION CONSULT NOTE   Pharmacy Consult for heparin drip management Indication: atrial fibrillation and aortic valve replacement   Allergies  Allergen Reactions  . Lexapro [Escitalopram Oxalate] Swelling    Patient Measurements: Height: 5\' 10"  (177.8 cm) Weight: (!) 354 lb 0.9 oz (160.6 kg) IBW/kg (Calculated) : 73 Heparin Dosing Weight: 121 kg (updated 6/21)  Vital Signs: Temp: 98.7 F (37.1 C) (07/10 0800) Temp Source: Axillary (07/10 0800) BP: 126/100 (07/10 1100) Pulse Rate: 102 (07/10 1200)  Labs: Recent Labs    05/15/18 1053 05/15/18 1753 05/16/18 0449 05/17/18 0511 05/17/18 0656  HGB 11.5*  --   --   --   --   HCT 34.8*  --   --   --   --   PLT 221  --   --   --   --   LABPROT  --   --  16.3*  --   --   INR  --   --  1.32  --   --   HEPARINUNFRC 0.32 0.43 0.38  --  0.27*  CREATININE 2.41*  --  2.22* 2.37*  --     Estimated Creatinine Clearance: 57.6 mL/min (A) (by C-G formula based on SCr of 2.37 mg/dL (H)).   Medical History: Past Medical History:  Diagnosis Date  . Allergy   . Anxiety   . Arrhythmia   . Arthritis   . Atrial fibrillation (HCC)   . CHF (congestive heart failure) (HCC)   . CKD (chronic kidney disease)   . Diabetes mellitus without complication (HCC)   . Diabetes mellitus, type II (HCC)   . Heart failure (HCC)   . Hyperlipidemia   . Hypertension   . Pancreatitis   . Sleep apnea     Medications:  Scheduled:  . ALPRAZolam  0.5 mg Per Tube BID  . amLODipine  10 mg Per Tube Daily  . bisacodyl  10 mg Rectal Once  . budesonide (PULMICORT) nebulizer solution  0.25 mg Nebulization Q6H  . chlorhexidine  15 mL Mouth Rinse BID  . Chlorhexidine Gluconate Cloth  6 each Topical Q0600  . diltiazem  60 mg Oral Q6H  . feeding supplement (PRO-STAT SUGAR FREE 64)  30 mL Per Tube Daily  . [START ON 05/18/2018] fentaNYL  100 mcg Transdermal Q72H  . fluvoxaMINE  25 mg Per Tube QHS  . free water  250 mL Per Tube Q6H  . gabapentin  600  mg Per Tube TID  . insulin aspart  0-20 Units Subcutaneous Q4H  . ipratropium-albuterol  3 mL Nebulization Q6H  . mouth rinse  15 mL Mouth Rinse 10 times per day  . metoprolol tartrate  50 mg Per Tube BID  . mupirocin ointment  1 application Nasal BID  . senna-docusate  2 tablet Per Tube BID  . sodium chloride flush  10-40 mL Intracatheter Q12H   Infusions:  . famotidine (PEPCID) IV Stopped (05/17/18 0830)  . feeding supplement (NEPRO CARB STEADY) Stopped (05/17/18 1050)  . heparin 2,000 Units/hr (05/17/18 1200)    Assessment: Pharmacy consulted for warfarin and heparin drip management for 47 yo male admitted to the ICU s/p cardiac arrest. Patient has history significant for aortic valve replacement, atrial fibrillation and CHF. Head CT is negative for infarction or hemorrhage.   Patient takes warfarin 5mg  daily as an outpatient for goal INR 2.5-3.5.   Goal of Therapy:  INR 2-3 Heparin level 0.3-0.7 units/ml Monitor platelets by anticoagulation protocol: Yes   Plan:  Will  bolus heparin 1800 units and increase heparin rate to 2000 units/hr. Will obtain follow up heparin level at  1600.   Will continue to hold warfarin, patient continuing to not tolerate feeds.  Pharmacy will continue to monitor and adjust per consult.     Simpson,Michael L 05/17/2018

## 2018-05-17 NOTE — Progress Notes (Signed)
ANTICOAGULATION CONSULT NOTE   Pharmacy Consult for heparin drip management Indication: atrial fibrillation and aortic valve replacement   Allergies  Allergen Reactions  . Lexapro [Escitalopram Oxalate] Swelling    Patient Measurements: Height: 5\' 10"  (177.8 cm) Weight: (!) 354 lb 0.9 oz (160.6 kg) IBW/kg (Calculated) : 73 Heparin Dosing Weight: 121 kg (updated 6/21)  Vital Signs: Temp: 98.6 F (37 C) (07/10 1200) Temp Source: Axillary (07/10 1200) BP: 119/82 (07/10 1500) Pulse Rate: 96 (07/10 1500)  Labs: Recent Labs    05/15/18 1053  05/16/18 0449 05/17/18 0511 05/17/18 0656 05/17/18 1638  HGB 11.5*  --   --   --   --   --   HCT 34.8*  --   --   --   --   --   PLT 221  --   --   --   --   --   LABPROT  --   --  16.3*  --   --   --   INR  --   --  1.32  --   --   --   HEPARINUNFRC 0.32   < > 0.38  --  0.27* 0.58  CREATININE 2.41*  --  2.22* 2.37*  --   --    < > = values in this interval not displayed.    Estimated Creatinine Clearance: 57.6 mL/min (A) (by C-G formula based on SCr of 2.37 mg/dL (H)).   Medical History: Past Medical History:  Diagnosis Date  . Allergy   . Anxiety   . Arrhythmia   . Arthritis   . Atrial fibrillation (HCC)   . CHF (congestive heart failure) (HCC)   . CKD (chronic kidney disease)   . Diabetes mellitus without complication (HCC)   . Diabetes mellitus, type II (HCC)   . Heart failure (HCC)   . Hyperlipidemia   . Hypertension   . Pancreatitis   . Sleep apnea     Medications:  Scheduled:  . ALPRAZolam  0.5 mg Per Tube BID  . amLODipine  10 mg Per Tube Daily  . bisacodyl  10 mg Rectal Once  . budesonide (PULMICORT) nebulizer solution  0.25 mg Nebulization Q6H  . chlorhexidine  15 mL Mouth Rinse BID  . Chlorhexidine Gluconate Cloth  6 each Topical Q0600  . diltiazem  60 mg Oral Q6H  . feeding supplement (PRO-STAT SUGAR FREE 64)  30 mL Per Tube Daily  . [START ON 05/18/2018] fentaNYL  100 mcg Transdermal Q72H  .  fluvoxaMINE  25 mg Per Tube QHS  . free water  250 mL Per Tube Q6H  . gabapentin  600 mg Per Tube TID  . insulin aspart  0-20 Units Subcutaneous Q4H  . ipratropium-albuterol  3 mL Nebulization Q6H  . mouth rinse  15 mL Mouth Rinse 10 times per day  . metoprolol tartrate  50 mg Per Tube BID  . mupirocin ointment  1 application Nasal BID  . senna-docusate  2 tablet Per Tube BID  . sodium chloride flush  10-40 mL Intracatheter Q12H   Infusions:  . famotidine (PEPCID) IV Stopped (05/17/18 0830)  . feeding supplement (NEPRO CARB STEADY) Stopped (05/17/18 1050)  . heparin 2,000 Units/hr (05/17/18 1400)    Assessment: Pharmacy consulted for warfarin and heparin drip management for 47 yo male admitted to the ICU s/p cardiac arrest. Patient has history significant for aortic valve replacement, atrial fibrillation and CHF. Head CT is negative for infarction or hemorrhage.   Patient  takes warfarin 5mg  daily as an outpatient for goal INR 2.5-3.5.   Goal of Therapy:  INR 2-3 Heparin level 0.3-0.7 units/ml Monitor platelets by anticoagulation protocol: Yes   Plan:  Continue heparin infusion at current rate recheck a HL in 6 hours.    Will continue to hold warfarin, patient continuing to not tolerate feeds.  Pharmacy will continue to monitor and adjust per consult.     Luisa Hart D 05/17/2018

## 2018-05-17 NOTE — Progress Notes (Signed)
dobhoff migrated out to 25 cm, reinserted to 75 cm, will order ABD film to confirm placement

## 2018-05-17 NOTE — Progress Notes (Signed)
Pt using his own mouthwash in room, prefers this to [employer] oral care.  He is tolerating this, and also hard candy with no s/sx distress

## 2018-05-17 NOTE — Progress Notes (Signed)
Physical Therapy Treatment Patient Details Name: Alexander RiffleOtis Leon Helling MRN: [id number] DOB: [birth year] Today's Date: 05/17/2018    History of Present Illness Pt. is a 47 y.o. male with PMH of atrial fibrillation, status post ablation and aortic valve replacement on long term warfarin, CHF, CKD, diabetes, obstructive sleep apnea and hypertension. Pt. had a pulseless VT cardiac arrest in MRI when he was being evaluated for back pain and sciatica, subsequently intubated in the intensive care unit. Intubated from 5/31-6/18 when trach placed. Pt for PEG placement tomorrow potentially. Of note, pt also with L LE DVT currently on heparin drip diagnosed on 6/21. Pt NG tube switched to Dobhoff this date and recent complaints of nausea per RN.    PT Comments    Pt seen this date and appears to agitated upon arrival. RN states that pt had Dobhoff tube inserted yesterday and has complained of nausea since then. RN Elnita Maxwellheryl assisted with treatment session to assist with line and lead management. Pt mod I to correct positioning in bed and scoot to position self Pt instructed in and tolerated there-ex well. At this time RN Elnita MaxwellCheryl attempted to reinsert dobhoff tube which had migrated, this increased pts irritation. Pt began to cough at which point dobhoff tube came out of pts mouth. RN Elnita Maxwellheryl withdrew dobhoff tube at this time leaving it only slightly in. At this time pt is very emotional, agitated, and nauseous. PT deferred transfers until next session due to pts current agitated state. PT plans to attempt transfer sit<>stand on next date pt medically appropriate. Pt could benefit from continued skilled therapy at this time to improve deficits toward PLOF. PT will continue to work with pt at least 2x/week while admitted. D/c recommendations continue to be SNF/LTACH.     Follow Up Recommendations  SNF;LTACH     Equipment Recommendations       Recommendations for Other Services       Precautions / Restrictions  Precautions Precautions: Fall Restrictions Weight Bearing Restrictions: No    Mobility  Bed Mobility Overal bed mobility: Modified Independent Bed Mobility: (scooting)           General bed mobility comments: pt mod I for increased UE to scoot and position self in bed. Pts LEs hanging off edge of bed upon arrival and is able to position LEs back on bed.  Transfers                 General transfer comment: Not assessed this visit due to pts nausea and agitation. PT plans to attempt sit<>stand transfer possibly next session.  Ambulation/Gait                 Stairs             Wheelchair Mobility    Modified Rankin (Stroke Patients Only)       Balance                                            Cognition Arousal/Alertness: Awake/alert Behavior During Therapy: Agitated(agitated due to nausea and Dubhoff tube) Overall Cognitive Status: Within Functional Limits for tasks assessed                                 General Comments: Pt alert and able to follow commands this date. Pt appears  to be agitated complaining of dubhoff tube irritation      Exercises Other Exercises Other Exercises: Pt instructed in LE ther-ex bilaterally including SLR, resisted hip abd/adduction, LAQ, ankle pumps x10 reps each.    General Comments        Pertinent Vitals/Pain Pain Assessment: No/denies pain    Home Living                      Prior Function            PT Goals (current goals can now be found in the care plan section) Acute Rehab PT Goals Patient Stated Goal: pt mouths that he wants to stand PT Goal Formulation: With patient Time For Goal Achievement: 05/31/18 Potential to Achieve Goals: Fair Progress towards PT goals: Progressing toward goals    Frequency    Min 2X/week      PT Plan Current plan remains appropriate    Co-evaluation              AM-PAC PT "6 Clicks" Daily Activity   Outcome Measure  Difficulty turning over in bed (including adjusting bedclothes, sheets and blankets)?: A Little Difficulty moving from lying on back to sitting on the side of the bed? : Unable Difficulty sitting down on and standing up from a chair with arms (e.g., wheelchair, bedside commode, etc,.)?: Unable Help needed moving to and from a bed to chair (including a wheelchair)?: Total Help needed walking in hospital room?: Total Help needed climbing 3-5 steps with a railing? : Total 6 Click Score: 8    End of Session Equipment Utilized During Treatment: Gait belt Activity Tolerance: (limited by agitation) Patient left: in bed;with call bell/phone within reach Nurse Communication: Mobility status(agitation) PT Visit Diagnosis: Muscle weakness (generalized) (M62.81);Difficulty in walking, not elsewhere classified (R26.2);Other symptoms and signs involving the nervous system (R29.898)     Time: 1610-9604 PT Time Calculation (min) (ACUTE ONLY): 27 min  Charges:                       G Codes:       Kandiss Ihrig, SPT    Zyria Fiscus 05/17/2018, 1:56 PM

## 2018-05-17 NOTE — Progress Notes (Signed)
ANTICOAGULATION CONSULT NOTE   Pharmacy Consult for heparin drip management Indication: atrial fibrillation and aortic valve replacement   Allergies  Allergen Reactions  . Lexapro [Escitalopram Oxalate] Swelling    Patient Measurements: Height: 5\' 10"  (177.8 cm) Weight: (!) 354 lb 0.9 oz (160.6 kg) IBW/kg (Calculated) : 73 Heparin Dosing Weight: 121 kg (updated 6/21)  Vital Signs: Temp: 98.6 F (37 C) (07/10 1200) Temp Source: Axillary (07/10 1200) BP: 119/82 (07/10 1500) Pulse Rate: 96 (07/10 1500)  Labs: Recent Labs    05/15/18 1053  05/16/18 0449 05/17/18 0511 05/17/18 0656 05/17/18 1638  HGB 11.5*  --   --   --   --   --   HCT 34.8*  --   --   --   --   --   PLT 221  --   --   --   --   --   LABPROT  --   --  16.3*  --   --   --   INR  --   --  1.32  --   --   --   HEPARINUNFRC 0.32   < > 0.38  --  0.27* 0.58  CREATININE 2.41*  --  2.22* 2.37*  --   --    < > = values in this interval not displayed.    Estimated Creatinine Clearance: 57.6 mL/min (A) (by C-G formula based on SCr of 2.37 mg/dL (H)).   Medical History: Past Medical History:  Diagnosis Date  . Allergy   . Anxiety   . Arrhythmia   . Arthritis   . Atrial fibrillation (HCC)   . CHF (congestive heart failure) (HCC)   . CKD (chronic kidney disease)   . Diabetes mellitus without complication (HCC)   . Diabetes mellitus, type II (HCC)   . Heart failure (HCC)   . Hyperlipidemia   . Hypertension   . Pancreatitis   . Sleep apnea     Medications:  Scheduled:  . ALPRAZolam  0.5 mg Per Tube BID  . amLODipine  10 mg Per Tube Daily  . bisacodyl  10 mg Rectal Once  . budesonide (PULMICORT) nebulizer solution  0.25 mg Nebulization Q6H  . chlorhexidine  15 mL Mouth Rinse BID  . Chlorhexidine Gluconate Cloth  6 each Topical Q0600  . diltiazem  60 mg Oral Q6H  . feeding supplement (PRO-STAT SUGAR FREE 64)  30 mL Per Tube Daily  . [START ON 05/18/2018] fentaNYL  100 mcg Transdermal Q72H  .  fluvoxaMINE  25 mg Per Tube QHS  . free water  250 mL Per Tube Q6H  . gabapentin  600 mg Per Tube TID  . insulin aspart  0-20 Units Subcutaneous Q4H  . ipratropium-albuterol  3 mL Nebulization Q6H  . mouth rinse  15 mL Mouth Rinse 10 times per day  . metoprolol tartrate  50 mg Per Tube BID  . mupirocin ointment  1 application Nasal BID  . senna-docusate  2 tablet Per Tube BID  . sodium chloride flush  10-40 mL Intracatheter Q12H   Infusions:  . famotidine (PEPCID) IV Stopped (05/17/18 0830)  . feeding supplement (NEPRO CARB STEADY) Stopped (05/17/18 1050)  . heparin 2,000 Units/hr (05/17/18 1400)    Assessment: Pharmacy consulted for warfarin and heparin drip management for 47 yo male admitted to the ICU s/p cardiac arrest. Patient has history significant for aortic valve replacement, atrial fibrillation and CHF. Head CT is negative for infarction or hemorrhage.   Patient  takes warfarin 5mg  daily as an outpatient for goal INR 2.5-3.5.   Goal of Therapy:  INR 2-3 Heparin level 0.3-0.7 units/ml Monitor platelets by anticoagulation protocol: Yes   Plan:  Will bolus heparin 1800 units and increase heparin rate to 2000 units/hr. Will obtain follow up heparin level at  1600.   7/10 1638 HL 0.58. Level is therapeutic X1. Will continue current infusion of 2000u/hr and recheck confirmatory HL in 6 hours.   Will continue to hold warfarin, patient continuing to not tolerate feeds.  Pharmacy will continue to monitor and adjust per consult.      Gardner Candle, PharmD, BCPS Clinical Pharmacist 05/17/2018 5:14 PM

## 2018-05-17 NOTE — Progress Notes (Signed)
Occupational Therapy Treatment Patient Details Name: Alexander Alexander MRN: [id number] DOB: 1971-02-19 Today's Date: 05/17/2018    History of present illness Pt. is a 47 y.o. male with PMH of atrial fibrillation, status post ablation and aortic valve replacement on long term warfarin, CHF, CKD, diabetes, obstructive sleep apnea and hypertension. Pt. had a pulseless VT cardiac arrest in MRI when he was being evaluated for back pain and sciatica, subsequently intubated in the intensive care unit. Intubated from 5/31-6/18 when trach placed. Pt for PEG placement tomorrow potentially. Of note, pt also with L LE DVT currently on heparin drip diagnosed on 6/21. Pt NG tube switched to Dobhoff this date and recent complaints of nausea per RN.   OT comments  Pt seen for OT this date after approval from RN. RN informed therapist of pt's recent c/o nausea. Pt tolerated session well, did require 1-2 minute rest breaks between each exercise. Pt's VS were stable throughout. Pt in bed, seated in chair position, performs therapist-instructed BUE RET with yellow resistance band including elbow flex/ext x10 reps, shld flex with elbow ext x10, horizontal abd/add x10 to improve UE strength for bed mobility and functional trasnfer participation. Will continue to progress towards OT goals.     Follow Up Recommendations  LTACH;SNF(SNF pending improved tolerance off vent)    Equipment Recommendations       Recommendations for Other Services      Precautions / Restrictions Restrictions Weight Bearing Restrictions: No       Mobility Bed Mobility                  Transfers                      Balance                                           ADL either performed or assessed with clinical judgement   ADL                                               Vision       Perception     Praxis      Cognition Arousal/Alertness: Awake/alert Behavior  During Therapy: WFL for tasks assessed/performed Overall Cognitive Status: Within Functional Limits for tasks assessed                                 General Comments: Pt very alert and participatory with OT this date, does complain of some lethary, but follows commands appropriately throughout session.         Exercises Other Exercises Other Exercises: Pt in bed, seated in chair position, performs therapist-instructed BUE RET with yellow resistance band including elbow flex/ext x10 reps, shld flex with elbow ext x10, horizontal abd/add x10 to improve UE strength for bed mobility and functional trasnfer participation.    Shoulder Instructions       General Comments      Pertinent Vitals/ Pain       Pain Assessment: No/denies pain  Home Living  Prior Functioning/Environment              Frequency  Min 2X/week        Progress Toward Goals  OT Goals(current goals can now be found in the care plan section)  Progress towards OT goals: Progressing toward goals  Acute Rehab OT Goals Patient Stated Goal: pt mouths that he wants to get stronger  Plan      Co-evaluation                 AM-PAC PT "6 Clicks" Daily Activity     Outcome Measure   Help from another person eating meals?: Total Help from another person taking care of personal grooming?: A Little Help from another person toileting, which includes using toliet, bedpan, or urinal?: Total Help from another person bathing (including washing, rinsing, drying)?: Total Help from another person to put on and taking off regular upper body clothing?: Total Help from another person to put on and taking off regular lower body clothing?: Total 6 Click Score: 8    End of Session    OT Visit Diagnosis: Muscle weakness (generalized) (M62.81)   Activity Tolerance Patient tolerated treatment well   Patient Left in bed;with call bell/phone  within reach;with bed alarm set   Nurse Communication          Time: 4076-8088 OT Time Calculation (min): 24 min  Charges: OT General Charges $OT Visit: 1 Visit OT Treatments $Therapeutic Exercise: 23-37 mins   Gerrianne Scale, MS, OTR/L ascom [phone] or [phone] 05/17/18, 1:21 PM

## 2018-05-18 ENCOUNTER — Inpatient Hospital Stay: Payer: Medicare HMO

## 2018-05-18 ENCOUNTER — Ambulatory Visit: Payer: Medicare HMO | Admitting: Licensed Clinical Social Worker

## 2018-05-18 LAB — BLOOD GAS, ARTERIAL
ACID-BASE EXCESS: 12.5 mmol/L — AB (ref 0.0–2.0)
Bicarbonate: 41.5 mmol/L — ABNORMAL HIGH (ref 20.0–28.0)
FIO2: 45
MECHVT: 500 mL
Mechanical Rate: 16
O2 SAT: 95.3 %
PEEP/CPAP: 8 cmH2O
PH ART: 7.34 — AB (ref 7.350–7.450)
PO2 ART: 82 mmHg — AB (ref 83.0–108.0)
Patient temperature: 37
pCO2 arterial: 77 mmHg (ref 32.0–48.0)

## 2018-05-18 LAB — BASIC METABOLIC PANEL
ANION GAP: 9 (ref 5–15)
BUN: 39 mg/dL — AB (ref 6–20)
CO2: 36 mmol/L — ABNORMAL HIGH (ref 22–32)
CREATININE: 2.62 mg/dL — AB (ref 0.61–1.24)
Calcium: 9.3 mg/dL (ref 8.9–10.3)
Chloride: 95 mmol/L — ABNORMAL LOW (ref 98–111)
GFR calc non Af Amer: 27 mL/min — ABNORMAL LOW (ref >60)
GFR, EST AFRICAN AMERICAN: 31 mL/min — AB (ref >60)
Glucose, Bld: 108 mg/dL — ABNORMAL HIGH (ref 70–99)
Potassium: 3.9 mmol/L (ref 3.5–5.1)
Sodium: 140 mmol/L (ref 135–145)

## 2018-05-18 LAB — CBC
HCT: 32.7 % — ABNORMAL LOW (ref 40.0–52.0)
Hemoglobin: 10.9 g/dL — ABNORMAL LOW (ref 13.0–18.0)
MCH: 27.5 pg (ref 26.0–34.0)
MCHC: 33.2 g/dL (ref 32.0–36.0)
MCV: 82.8 fL (ref 80.0–100.0)
PLATELETS: 221 10*3/uL (ref 150–440)
RBC: 3.95 MIL/uL — ABNORMAL LOW (ref 4.40–5.90)
RDW: 18.4 % — AB (ref 11.5–14.5)
WBC: 7.9 10*3/uL (ref 3.8–10.6)

## 2018-05-18 LAB — GLUCOSE, CAPILLARY
GLUCOSE-CAPILLARY: 116 mg/dL — AB (ref 70–99)
GLUCOSE-CAPILLARY: 125 mg/dL — AB (ref 70–99)
Glucose-Capillary: 122 mg/dL — ABNORMAL HIGH (ref 70–99)
Glucose-Capillary: 169 mg/dL — ABNORMAL HIGH (ref 70–99)
Glucose-Capillary: 136 mg/dL — ABNORMAL HIGH (ref 70–99)

## 2018-05-18 LAB — HEPARIN LEVEL (UNFRACTIONATED): Heparin Unfractionated: 0.52 IU/mL (ref 0.30–0.70)

## 2018-05-18 MED ORDER — MIDAZOLAM HCL 2 MG/2ML IJ SOLN
2.0000 mg | Freq: Once | INTRAMUSCULAR | Status: AC
  Administered 2018-05-18: 2 mg via INTRAVENOUS
  Filled 2018-05-18: qty 2

## 2018-05-18 NOTE — Progress Notes (Signed)
RN notified Dr Belia HemanKasa to see result of xray.  Can go head and use Dobhoff per Dr Belia HemanKasa.

## 2018-05-18 NOTE — Progress Notes (Addendum)
PATIENT NAME: Delia HeadyOtis Haran    MR#:  [id number]  DATE OF BIRTH:  10-Dec-1970  SUBJECTIVE:  dubhoff tube misplaced-needs to be replaced Patient won't allow it Trach slipped out and replaced without issues On PRVC   On Vent   REVIEW OF SYSTEMS:    Review of Systems  Constitutional: Negative for chills and fever.  HENT: Negative for congestion and tinnitus.   Eyes: Negative for blurred vision and double vision.  Respiratory: Negative for cough, shortness of breath and wheezing.   Cardiovascular: Negative for chest pain, orthopnea and PND.  Gastrointestinal: Negative for abdominal pain, diarrhea, nausea and vomiting.  Genitourinary: Negative for dysuria and hematuria.  Neurological: Negative for dizziness, sensory change and focal weakness.  All other systems reviewed and are negative.    VITALS:  Blood pressure 107/83, pulse (!) 102, temperature 99.8 F (37.7 C), temperature source Oral, resp. rate 20, height 5\' 10"  (1.778 m), weight (!) 356 lb 7.7 oz (161.7 kg), SpO2 95 %.  PHYSICAL EXAMINATION:   Physical Exam  GENERAL:  47 y.o.-year-old patient lying in bed s/p Trach on vent and follow simple commands EYES: Pupils equal, round, reactive to light. No scleral icterus. Extraocular muscles intact.  HEENT: Head atraumatic, normocephalic. Oropharynx and nasopharynx clear. S/p Trach and on vent.  Dobhoff tube in place  NECK:  Supple, no jugular venous distention. No thyroid enlargement, no tenderness.  LUNGS: Normal breath sounds bilaterally, no wheezing, rales, rhonchi. No use of accessory muscles of respiration.  CARDIOVASCULAR: S1, S2 RRR, + Metallic valve click. No murmurs, rubs, or gallops.  ABDOMEN: Soft, nontender, nondistended. Bowel sounds present. No organomegaly or mass.  EXTREMITIES: No cyanosis, clubbing or edema b/l.    NEUROLOGIC: Cranial nerves II through XII are intact. No focal Motor or sensory deficits b/l. Globally weak  PSYCHIATRIC: The patient is alert and  oriented x 1. SKIN: No obvious rash, lesion, or ulcer.    LABORATORY PANEL:   CBC Recent Labs  Lab 05/18/18 0504  WBC 7.9  HGB 10.9*  HCT 32.7*  PLT 221   ------------------------------------------------------------------------------------------------------------------  Chemistries  Recent Labs  Lab 05/16/18 0449  05/18/18 0504  NA 136   < > 140  K 4.5   < > 3.9  CL 93*   < > 95*  CO2 34*   < > 36*  GLUCOSE 91   < > 108*  BUN 58*   < > 39*  CREATININE 2.22*   < > 2.62*  CALCIUM 9.3   < > 9.3  MG 2.1  --   --   AST 28  --   --   ALT 23  --   --   ALKPHOS 62  --   --   BILITOT 1.2  --   --    < > = values in this interval not displayed.   ------------------------------------------------------------------------------------------------------------------  Cardiac Enzymes No results for input(s): TROPONINI in the last 168 hours. ------------------------------------------------------------------------------------------------------------------  RADIOLOGY:  Dg Abd 1 View  Result Date: 05/16/2018 CLINICAL DATA:  Evaluate feeding tube placement. EXAM: ABDOMEN - 1 VIEW COMPARISON:  None. FINDINGS: A feeding tube terminates in the left upper quadrant, likely in the body of the stomach. IMPRESSION: The feeding tube appears to terminate in the body of the stomach. Electronically Signed   By: Gerome Samavid  Williams III M.D   On: 05/16/2018 19:22     ASSESSMENT AND PLAN:  47 year old male with past medical history significant for A. fib status post ablation, aortic valve  replacement history of congestive heart failure, CKD, morbid obesity, diabetes and sleep apnea    1. s/p Acute cardiopulmonary arrest  Pulseless VT Difficult weaning from vent,s/p tracheostomy  Continuemechanical ventilation with weaning     2.Atrial fibrillationwith RVR - rate controlled.  - Continuemetoprolol,Cardizem.  - cont. Heparin gtt given pt. Hx of Mechanical aortic valve placement.  Coumadin  on hold for not tolerating TF's Need to re-assess Dubhoff tube  3.History of mechanical aortic valve replacement - cont. Heparin gtt.   4. Acute renal failurew/ CKD stage III Secondary toATN, Cr. Close to baseline now.   Re-appeal LT[facility] process pending   Lucie Leather, M.D.  [facility]  Medical Director [facility] Medical Director [facility] Cardio-Pulmonary Department

## 2018-05-18 NOTE — Progress Notes (Signed)
RN notified Dr Belia HemanKasa that patient is willing to attemp reencertion of NG, doubhoff.  Dr Belia HemanKasa gave order for 2mg  of versed IV once prior to insertion.

## 2018-05-18 NOTE — Progress Notes (Signed)
ANTICOAGULATION CONSULT NOTE   Pharmacy Consult for heparin drip management Indication: atrial fibrillation and aortic valve replacement   Allergies  Allergen Reactions  . Lexapro [Escitalopram Oxalate] Swelling    Patient Measurements: Height: 5\' 10"  (177.8 cm) Weight: (!) 356 lb 7.7 oz (161.7 kg) IBW/kg (Calculated) : 73 Heparin Dosing Weight: 121 kg (updated 6/21)  Vital Signs: Temp: 99.1 F (37.3 C) (07/11 0900) Temp Source: Oral (07/11 0900) BP: 109/71 (07/11 1500) Pulse Rate: 84 (07/11 1500)  Labs: Recent Labs    05/16/18 0449 05/17/18 0511  05/17/18 1638 05/17/18 2258 05/18/18 0504 05/18/18 0610  HGB  --   --   --   --   --  10.9*  --   HCT  --   --   --   --   --  32.7*  --   PLT  --   --   --   --   --  221  --   LABPROT 16.3*  --   --   --   --   --   --   INR 1.32  --   --   --   --   --   --   HEPARINUNFRC 0.38  --    < > 0.58 0.63  --  0.52  CREATININE 2.22* 2.37*  --   --   --  2.62*  --    < > = values in this interval not displayed.    Estimated Creatinine Clearance: 52.3 mL/min (A) (by C-G formula based on SCr of 2.62 mg/dL (H)).   Medical History: Past Medical History:  Diagnosis Date  . Allergy   . Anxiety   . Arrhythmia   . Arthritis   . Atrial fibrillation (HCC)   . CHF (congestive heart failure) (HCC)   . CKD (chronic kidney disease)   . Diabetes mellitus without complication (HCC)   . Diabetes mellitus, type II (HCC)   . Heart failure (HCC)   . Hyperlipidemia   . Hypertension   . Pancreatitis   . Sleep apnea     Medications:  Scheduled:  . ALPRAZolam  0.5 mg Per Tube BID  . amLODipine  10 mg Per Tube Daily  . bisacodyl  10 mg Rectal Once  . budesonide (PULMICORT) nebulizer solution  0.25 mg Nebulization Q6H  . chlorhexidine  15 mL Mouth Rinse BID  . Chlorhexidine Gluconate Cloth  6 each Topical Q0600  . diltiazem  60 mg Oral Q6H  . feeding supplement (PRO-STAT SUGAR FREE 64)  30 mL Per Tube Daily  . fentaNYL  100 mcg  Transdermal Q72H  . fluvoxaMINE  25 mg Per Tube QHS  . free water  250 mL Per Tube Q6H  . gabapentin  600 mg Per Tube TID  . insulin aspart  0-20 Units Subcutaneous Q4H  . ipratropium-albuterol  3 mL Nebulization Q6H  . mouth rinse  15 mL Mouth Rinse 10 times per day  . metoprolol tartrate  50 mg Per Tube BID  . mupirocin ointment  1 application Nasal BID  . senna-docusate  2 tablet Per Tube BID  . sodium chloride flush  10-40 mL Intracatheter Q12H   Infusions:  . famotidine (PEPCID) IV Stopped (05/18/18 0930)  . feeding supplement (NEPRO CARB STEADY) 1,000 mL (05/18/18 1245)  . heparin 2,000 Units/hr (05/18/18 0100)    Assessment: Pharmacy consulted for warfarin and heparin drip management for 47 yo male admitted to the ICU s/p cardiac arrest. Patient has  history significant for aortic valve replacement, atrial fibrillation and CHF. Head CT is negative for infarction or hemorrhage.   Patient takes warfarin 5mg  daily as an outpatient for goal INR 2.5-3.5.   Goal of Therapy:  INR 2-3 Heparin level 0.3-0.7 units/ml Monitor platelets by anticoagulation protocol: Yes   Plan:  Continue heparin infusion at current rate recheck heparin level with am labs.  Will continue to hold warfarin, patient continuing to not tolerate feeds.  Pharmacy will continue to monitor and adjust per consult.     Charl Wellen L 05/18/2018

## 2018-05-18 NOTE — Progress Notes (Signed)
Patient up in chair with help of PT.  Currently in no apparent distress, visiting with family and watching TV.  RN continue to monitor.

## 2018-05-18 NOTE — Progress Notes (Signed)
ANTICOAGULATION CONSULT NOTE   Pharmacy Consult for heparin drip management Indication: atrial fibrillation and aortic valve replacement   Allergies  Allergen Reactions  . Lexapro [Escitalopram Oxalate] Swelling    Patient Measurements: Height: 5\' 10"  (177.8 cm) Weight: (!) 354 lb 0.9 oz (160.6 kg) IBW/kg (Calculated) : 73 Heparin Dosing Weight: 121 kg (updated 6/21)  Vital Signs: Temp: 100.1 F (37.8 C) (07/10 2145) Temp Source: Oral (07/10 2145) BP: 135/98 (07/10 2200) Pulse Rate: 94 (07/10 2200)  Labs: Recent Labs    05/15/18 1053  05/16/18 0449 05/17/18 0511 05/17/18 0656 05/17/18 1638 05/17/18 2258  HGB 11.5*  --   --   --   --   --   --   HCT 34.8*  --   --   --   --   --   --   PLT 221  --   --   --   --   --   --   LABPROT  --   --  16.3*  --   --   --   --   INR  --   --  1.32  --   --   --   --   HEPARINUNFRC 0.32   < > 0.38  --  0.27* 0.58 0.63  CREATININE 2.41*  --  2.22* 2.37*  --   --   --    < > = values in this interval not displayed.    Estimated Creatinine Clearance: 57.6 mL/min (A) (by C-G formula based on SCr of 2.37 mg/dL (H)).   Medical History: Past Medical History:  Diagnosis Date  . Allergy   . Anxiety   . Arrhythmia   . Arthritis   . Atrial fibrillation (HCC)   . CHF (congestive heart failure) (HCC)   . CKD (chronic kidney disease)   . Diabetes mellitus without complication (HCC)   . Diabetes mellitus, type II (HCC)   . Heart failure (HCC)   . Hyperlipidemia   . Hypertension   . Pancreatitis   . Sleep apnea     Medications:  Scheduled:  . ALPRAZolam  0.5 mg Per Tube BID  . amLODipine  10 mg Per Tube Daily  . bisacodyl  10 mg Rectal Once  . budesonide (PULMICORT) nebulizer solution  0.25 mg Nebulization Q6H  . chlorhexidine  15 mL Mouth Rinse BID  . Chlorhexidine Gluconate Cloth  6 each Topical Q0600  . diltiazem  60 mg Oral Q6H  . feeding supplement (PRO-STAT SUGAR FREE 64)  30 mL Per Tube Daily  . fentaNYL  100 mcg  Transdermal Q72H  . fluvoxaMINE  25 mg Per Tube QHS  . free water  250 mL Per Tube Q6H  . gabapentin  600 mg Per Tube TID  . insulin aspart  0-20 Units Subcutaneous Q4H  . ipratropium-albuterol  3 mL Nebulization Q6H  . mouth rinse  15 mL Mouth Rinse 10 times per day  . metoprolol tartrate  50 mg Per Tube BID  . mupirocin ointment  1 application Nasal BID  . senna-docusate  2 tablet Per Tube BID  . sodium chloride flush  10-40 mL Intracatheter Q12H   Infusions:  . famotidine (PEPCID) IV 20 mg (05/17/18 2340)  . feeding supplement (NEPRO CARB STEADY) Stopped (05/17/18 1050)  . heparin 2,000 Units/hr (05/17/18 1857)    Assessment: Pharmacy consulted for warfarin and heparin drip management for 47 yo male admitted to the ICU s/p cardiac arrest. Patient has history significant  for aortic valve replacement, atrial fibrillation and CHF. Head CT is negative for infarction or hemorrhage.   Patient takes warfarin 5mg  daily as an outpatient for goal INR 2.5-3.5.   Goal of Therapy:  INR 2-3 Heparin level 0.3-0.7 units/ml Monitor platelets by anticoagulation protocol: Yes   Plan:  Continue heparin infusion at current rate recheck a HL in 6 hours.    Will continue to hold warfarin, patient continuing to not tolerate feeds.  Pharmacy will continue to monitor and adjust per consult.     7/10 :  HL @ 23:00 = 0.63 Will continue this pt on current rate and recheck HL on 7/11 with AM labs.   Bernese Doffing D 05/18/2018

## 2018-05-18 NOTE — Progress Notes (Signed)
RN notified Dr Belia HemanKasa that patient pulled out his NG yesterday, refused to have it replaced per night RN, patient wants to talk to MD.  MD states "okay, we can hold his PO meds for now"

## 2018-05-18 NOTE — Progress Notes (Signed)
ANTICOAGULATION CONSULT NOTE   Pharmacy Consult for heparin drip management Indication: atrial fibrillation and aortic valve replacement   Allergies  Allergen Reactions  . Lexapro [Escitalopram Oxalate] Swelling    Patient Measurements: Height: 5\' 10"  (177.8 cm) Weight: (!) 356 lb 7.7 oz (161.7 kg) IBW/kg (Calculated) : 73 Heparin Dosing Weight: 121 kg (updated 6/21)  Vital Signs: Temp: 99.8 F (37.7 C) (07/11 0432) Temp Source: Oral (07/11 0432) BP: 107/83 (07/11 0600) Pulse Rate: 102 (07/11 0600)  Labs: Recent Labs    05/15/18 1053  05/16/18 0449 05/17/18 0511  05/17/18 1638 05/17/18 2258 05/18/18 0504 05/18/18 0610  HGB 11.5*  --   --   --   --   --   --  10.9*  --   HCT 34.8*  --   --   --   --   --   --  32.7*  --   PLT 221  --   --   --   --   --   --  221  --   LABPROT  --   --  16.3*  --   --   --   --   --   --   INR  --   --  1.32  --   --   --   --   --   --   HEPARINUNFRC 0.32   < > 0.38  --    < > 0.58 0.63  --  0.52  CREATININE 2.41*  --  2.22* 2.37*  --   --   --  2.62*  --    < > = values in this interval not displayed.    Estimated Creatinine Clearance: 52.3 mL/min (A) (by C-G formula based on SCr of 2.62 mg/dL (H)).   Medical History: Past Medical History:  Diagnosis Date  . Allergy   . Anxiety   . Arrhythmia   . Arthritis   . Atrial fibrillation (HCC)   . CHF (congestive heart failure) (HCC)   . CKD (chronic kidney disease)   . Diabetes mellitus without complication (HCC)   . Diabetes mellitus, type II (HCC)   . Heart failure (HCC)   . Hyperlipidemia   . Hypertension   . Pancreatitis   . Sleep apnea     Medications:  Scheduled:  . ALPRAZolam  0.5 mg Per Tube BID  . amLODipine  10 mg Per Tube Daily  . bisacodyl  10 mg Rectal Once  . budesonide (PULMICORT) nebulizer solution  0.25 mg Nebulization Q6H  . chlorhexidine  15 mL Mouth Rinse BID  . Chlorhexidine Gluconate Cloth  6 each Topical Q0600  . diltiazem  60 mg Oral Q6H  .  feeding supplement (PRO-STAT SUGAR FREE 64)  30 mL Per Tube Daily  . fentaNYL  100 mcg Transdermal Q72H  . fluvoxaMINE  25 mg Per Tube QHS  . free water  250 mL Per Tube Q6H  . gabapentin  600 mg Per Tube TID  . insulin aspart  0-20 Units Subcutaneous Q4H  . ipratropium-albuterol  3 mL Nebulization Q6H  . mouth rinse  15 mL Mouth Rinse 10 times per day  . metoprolol tartrate  50 mg Per Tube BID  . mupirocin ointment  1 application Nasal BID  . senna-docusate  2 tablet Per Tube BID  . sodium chloride flush  10-40 mL Intracatheter Q12H   Infusions:  . famotidine (PEPCID) IV Stopped (05/18/18 0010)  . feeding supplement (NEPRO CARB STEADY)  Stopped (05/17/18 1050)  . heparin 2,000 Units/hr (05/18/18 0100)    Assessment: Pharmacy consulted for warfarin and heparin drip management for 47 yo male admitted to the ICU s/p cardiac arrest. Patient has history significant for aortic valve replacement, atrial fibrillation and CHF. Head CT is negative for infarction or hemorrhage.   Patient takes warfarin 5mg  daily as an outpatient for goal INR 2.5-3.5.   Goal of Therapy:  INR 2-3 Heparin level 0.3-0.7 units/ml Monitor platelets by anticoagulation protocol: Yes   Plan:  Continue heparin infusion at current rate recheck a HL in 6 hours.    Will continue to hold warfarin, patient continuing to not tolerate feeds.  Pharmacy will continue to monitor and adjust per consult.     7/10 :  HL @ 23:00 = 0.63  7/11: HL @ 0610 = 0.52  Will continue this pt on current rate and recheck HL on 7/12 with AM labs.   Alexander Alexander 05/18/2018

## 2018-05-18 NOTE — Progress Notes (Signed)
[facility] [place] [facility]   PATIENT NAME: Alexander Alexander    MR#:  [id number]  DATE OF BIRTH:  [birth year]  SUBJECTIVE:   Pt. S/p Trach , pt was on spontanous breathing earlier today Patient's Dobbhoff came out.  Plan to replace  REVIEW OF SYSTEMS:    Review of Systems  Constitutional: Negative for chills and fever.  HENT: Negative for congestion and tinnitus.   Eyes: Negative for blurred vision and double vision.  Respiratory: Negative for cough, shortness of breath and wheezing.   Cardiovascular: Negative for chest pain, orthopnea and PND.  Gastrointestinal: Negative for abdominal pain, diarrhea, nausea and vomiting.  Genitourinary: Negative for dysuria and hematuria.  Neurological: Negative for dizziness, sensory change and focal weakness.  All other systems reviewed and are negative.   Nutrition: Tube feeds  Tolerating Diet: No Tolerating PT: Await Eval.   DRUG ALLERGIES:   Allergies  Allergen Reactions  . Lexapro [Escitalopram Oxalate] Swelling    VITALS:  Blood pressure 109/71, pulse 84, temperature 99.1 F (37.3 C), temperature source Oral, resp. rate 13, height 5\' 10"  (1.778 m), weight (!) 161.7 kg (356 lb 7.7 oz), SpO2 94 %.  PHYSICAL EXAMINATION:   Physical Exam  GENERAL:  47 y.o.-year-old patient lying in bed s/p Trach on vent and follow simple commands EYES: Pupils equal, round, reactive to light. No scleral icterus. Extraocular muscles intact.  HEENT: Head atraumatic, normocephalic. Oropharynx and nasopharynx clear. S/p Trach and on vent.  NG tube in place  NECK:  Supple, no jugular venous distention. No thyroid enlargement, no tenderness.  LUNGS: Normal breath sounds bilaterally, no wheezing, rales, rhonchi. No use of accessory muscles of respiration.  CARDIOVASCULAR: S1, S2 RRR, + Metallic valve click. No murmurs, rubs, or gallops.  ABDOMEN: Soft, nontender, nondistended. Bowel sounds present. No organomegaly or mass.   EXTREMITIES: No cyanosis, clubbing or edema b/l.    NEUROLOGIC: Cranial nerves II through XII are intact. No focal Motor or sensory deficits b/l. Globally weak  PSYCHIATRIC: The patient is alert and oriented x 3. SKIN: No obvious rash, lesion, or ulcer.    LABORATORY PANEL:   CBC Recent Labs  Lab 05/18/18 0504  WBC 7.9  HGB 10.9*  HCT 32.7*  PLT 221   ------------------------------------------------------------------------------------------------------------------  Chemistries  Recent Labs  Lab 05/16/18 0449  05/18/18 0504  NA 136   < > 140  K 4.5   < > 3.9  CL 93*   < > 95*  CO2 34*   < > 36*  GLUCOSE 91   < > 108*  BUN 58*   < > 39*  CREATININE 2.22*   < > 2.62*  CALCIUM 9.3   < > 9.3  MG 2.1  --   --   AST 28  --   --   ALT 23  --   --   ALKPHOS 62  --   --   BILITOT 1.2  --   --    < > = values in this interval not displayed.   ------------------------------------------------------------------------------------------------------------------  Cardiac Enzymes No results for input(s): TROPONINI in the last 168 hours. ------------------------------------------------------------------------------------------------------------------  RADIOLOGY:  Dg Abd 1 View  Result Date: 05/18/2018 CLINICAL DATA:  Feeding tube placement at bedside. EXAM: ABDOMEN - 1 VIEW COMPARISON:  05/16/2018, 05/04/2018 and earlier, including CT abdomen and pelvis 05/01/2018. FINDINGS: Feeding tube tip in the fundus of the stomach. Visualized bowel gas pattern nonobstructive. IMPRESSION: Feeding tube tip in the fundus of the  stomach. Electronically Signed   By: Hulan Saashomas  Lawrence M.D.   On: 05/18/2018 11:42   Dg Abd 1 View  Result Date: 05/16/2018 CLINICAL DATA:  Evaluate feeding tube placement. EXAM: ABDOMEN - 1 VIEW COMPARISON:  None. FINDINGS: A feeding tube terminates in the left upper quadrant, likely in the body of the stomach. IMPRESSION: The feeding tube appears to terminate in the body  of the stomach. Electronically Signed   By: Gerome Samavid  Williams III M.D   On: 05/16/2018 19:22     ASSESSMENT AND PLAN:   47 year old male with past medical history significant for A. fib status post ablation, aortic valve replacement on warfarin, history of congestive heart failure, CKD, morbid obesity, diabetes and sleep apnea who was getting an outpatient MRI of his lower back had a cardiac arrest.  * Acute cardiopulmonary arrest  - Pulseless VT - continue weaning from vent,s/p tracheostomy  - Continuemechanical ventilation with weaning as per Intensivist.   * AcuteMRSA/VAP Treated withcourse ofLinezolid - afebrile/hemodynamically stable.   *Atrial fibrillationwith RVR - rate controlled.  - Continuemetoprolol,Cardizem.  - cont. Heparin gtt given pt. Hx of Mechanical aortic valve placement.   * History of mechanical aortic valve replacement - cont. Heparin gtt.   * Acute toxic metabolic encephalopathy  - Neurology input appreciated  - initially Started on Kepprafor possible partial seizures. Later stopped. - mental status back to baseline, no acute seizures.   * Chronic diabetes mellitus type 2 - cont. SSI. BS stable.   * Acute renal failurew/ CKD stage III Secondary toATN, Cr. Close to baseline now.   * N/V -resolved.  Possibly due to constipation.   Improved  * nutrition -had  Dobbhoff tube placement then came out will need it replaced   All the records are reviewed and case discussed with Care Management/Social Worker. Management plans discussed with the patient, family and they are in agreement.  CODE STATUS: full code  DVT Prophylaxis: Heparin gtt  TOTAL TIME TAKING CARE OF THIS PATIENT: 25 minutes.   POSSIBLE D/C unclear, DEPENDING ON CLINICAL CONDITION and progress.    Auburn BilberryShreyang Jozelyn Kuwahara M.D on 05/18/2018 at 3:30 PM  Between 7am to 6pm - Pager - [phone]  After 6pm go to [email or web address] - Social research officer, governmentpassword [email or web address]  [facility] [place]  [facility]  Office  [phone]  CC: Primary care physician; Erasmo DownerBacigalupo, Angela M, MD

## 2018-05-18 NOTE — Progress Notes (Signed)
Physical Therapy Treatment Patient Details Name: Alexander Alexander MRN: [id number] DOB: 05/13/1971 Today's Date: 05/18/2018    History of Present Illness Pt. is a 47 y.o. male with PMH of atrial fibrillation, status post ablation and aortic valve replacement on long term warfarin, CHF, CKD, diabetes, obstructive sleep apnea and hypertension. Pt. had a pulseless VT cardiac arrest in MRI when he was being evaluated for back pain and sciatica, subsequently intubated in the intensive care unit. Intubated from 5/31-6/18 when trach placed. Pt for PEG placement tomorrow potentially. Of note, pt also with L LE DVT currently on heparin drip diagnosed on 6/21. Pt NG tube switched to Dobhoff this date and recent complaints of nausea per RN.    PT Comments    Pt is progressing well towards goals. He was very alert today and eager to participate in therapy session. RN assisted with treament session to aid with line/lead management. Pt needs +2 min assist to transfer supine to sit, but sits independent. Pt moves in bed with MOD I for scooting. Pt. Was able to stand with +2 MOD A for knee buckling secondary to weakness. RN managed lines/leads to maintain safety during treatment. Pt. Has deficits with standing independent, single leg stance with RW , and standing endurance. Pt could benefit from skilled therapy to improve deficits towards PLOF. D/c recommendations continue to be SNF/LTACH  Follow Up Recommendations  SNF;LTACH     Equipment Recommendations       Recommendations for Other Services       Precautions / Restrictions Restrictions Weight Bearing Restrictions: No    Mobility  Bed Mobility Overal bed mobility: Needs Assistance(limited by strength and lines/leads)       Supine to sit: Min assist;+2 for physical assistance     General bed mobility comments: MOD I for scooting, but overall needs assistance  Transfers Overall transfer level: Needs assistance Equipment used: Rolling walker  (2 wheeled) Transfers: Sit to/from Stand Sit to Stand: +2 physical assistance(plus one more for line/lead)         General transfer comment: Pt needed B physaical assistance at knees to prevent buckling with sit to stand and in standing. Once pt was standing the bed was moved and chair was brought behind pt. pivot/walk was not safe at this time.  Ambulation/Gait                 Stairs             Wheelchair Mobility    Modified Rankin (Stroke Patients Only)       Balance       Sitting balance - Comments: pt displays safe sitting blance EOB with good postural control.       Standing balance comment: Pt. was able to maintain standing with RW and +2 Min A for 2 reps of 1 minute                            Cognition Arousal/Alertness: Awake/alert Behavior During Therapy: WFL for tasks assessed/performed Overall Cognitive Status: Within Functional Limits for tasks assessed                                 General Comments: Pt. is alert and able to follow commands      Exercises Other Exercises Other Exercises: pt instructed in B LE there.ex to include anlkle pumps x10, SLR x10, PT  resisted hip abd x10, and isometric hip flexor holds for 10 seconds x5.    General Comments        Pertinent Vitals/Pain Pain Assessment: No/denies pain    Home Living                      Prior Function            PT Goals (current goals can now be found in the care plan section) Acute Rehab PT Goals Patient Stated Goal: pt mouths that he wants to stand PT Goal Formulation: With patient Time For Goal Achievement: 05/31/18 Potential to Achieve Goals: Fair Progress towards PT goals: Progressing toward goals    Frequency    Min 2X/week      PT Plan Current plan remains appropriate    Co-evaluation              AM-PAC PT "6 Clicks" Daily Activity  Outcome Measure  Difficulty turning over in bed (including adjusting  bedclothes, sheets and blankets)?: A Little Difficulty moving from lying on back to sitting on the side of the bed? : Unable Difficulty sitting down on and standing up from a chair with arms (e.g., wheelchair, bedside commode, etc,.)?: Unable Help needed moving to and from a bed to chair (including a wheelchair)?: Total Help needed walking in hospital room?: Total Help needed climbing 3-5 steps with a railing? : Total 6 Click Score: 8    End of Session Equipment Utilized During Treatment: Gait belt Activity Tolerance: Patient tolerated treatment well;Patient limited by fatigue(pt. limited by B LE strength) Patient left: in chair;with family/visitor present(nurse and family member were still in room when PT left) Nurse Communication: Mobility status(nurse was present during PT session) PT Visit Diagnosis: Muscle weakness (generalized) (M62.81);Difficulty in walking, not elsewhere classified (R26.2);Other symptoms and signs involving the nervous system (W09.811(R29.898)     Time: [id number] PT Time Calculation (min) (ACUTE ONLY): 43 min  Charges:                       G Codes:       Renaldo HarrisonStephen Kristion Holifield, SPT   Renaldo HarrisonStephen Jenavie Stanczak 05/18/2018, 5:33 PM

## 2018-05-18 NOTE — Care Management (Signed)
RNCM checked in with Alexander Alexander with [facility]- [facility]

## 2018-05-18 NOTE — Progress Notes (Signed)
Placed 40% Fio2 aerosol trach collar now

## 2018-05-18 NOTE — Progress Notes (Signed)
   05/18/18 1355  Clinical Encounter Type  Visited With Family  Visit Type Follow-up  Spiritual Encounters  Spiritual Needs Prayer   Chaplain attempted visit.  Patient sleeping, family declined visit.  Chaplain offered silent and energetic prayers.

## 2018-05-19 ENCOUNTER — Inpatient Hospital Stay: Payer: Medicare HMO

## 2018-05-19 DIAGNOSIS — I10 Essential (primary) hypertension: Secondary | ICD-10-CM

## 2018-05-19 DIAGNOSIS — J9610 Chronic respiratory failure, unspecified whether with hypoxia or hypercapnia: Secondary | ICD-10-CM

## 2018-05-19 LAB — GLUCOSE, CAPILLARY
GLUCOSE-CAPILLARY: 124 mg/dL — AB (ref 70–99)
GLUCOSE-CAPILLARY: 139 mg/dL — AB (ref 70–99)
GLUCOSE-CAPILLARY: 152 mg/dL — AB (ref 70–99)
GLUCOSE-CAPILLARY: 184 mg/dL — AB (ref 70–99)
Glucose-Capillary: 114 mg/dL — ABNORMAL HIGH (ref 70–99)
Glucose-Capillary: 131 mg/dL — ABNORMAL HIGH (ref 70–99)
Glucose-Capillary: 135 mg/dL — ABNORMAL HIGH (ref 70–99)

## 2018-05-19 LAB — BASIC METABOLIC PANEL
Anion gap: 8 (ref 5–15)
BUN: 31 mg/dL — ABNORMAL HIGH (ref 6–20)
CHLORIDE: 96 mmol/L — AB (ref 98–111)
CO2: 35 mmol/L — AB (ref 22–32)
Calcium: 9.1 mg/dL (ref 8.9–10.3)
Creatinine, Ser: 2.16 mg/dL — ABNORMAL HIGH (ref 0.61–1.24)
GFR calc non Af Amer: 34 mL/min — ABNORMAL LOW (ref >60)
GFR, EST AFRICAN AMERICAN: 40 mL/min — AB (ref >60)
Glucose, Bld: 154 mg/dL — ABNORMAL HIGH (ref 70–99)
POTASSIUM: 4.2 mmol/L (ref 3.5–5.1)
SODIUM: 139 mmol/L (ref 135–145)

## 2018-05-19 LAB — CBC
HEMATOCRIT: 33.5 % — AB (ref 40.0–52.0)
Hemoglobin: 11.1 g/dL — ABNORMAL LOW (ref 13.0–18.0)
MCH: 27.5 pg (ref 26.0–34.0)
MCHC: 33 g/dL (ref 32.0–36.0)
MCV: 83.4 fL (ref 80.0–100.0)
Platelets: 217 10*3/uL (ref 150–440)
RBC: 4.01 MIL/uL — AB (ref 4.40–5.90)
RDW: 18.5 % — ABNORMAL HIGH (ref 11.5–14.5)
WBC: 9.6 10*3/uL (ref 3.8–10.6)

## 2018-05-19 LAB — HEPARIN LEVEL (UNFRACTIONATED): HEPARIN UNFRACTIONATED: 0.56 [IU]/mL (ref 0.30–0.70)

## 2018-05-19 MED ORDER — BUDESONIDE 0.25 MG/2ML IN SUSP
0.2500 mg | Freq: Two times a day (BID) | RESPIRATORY_TRACT | Status: DC
  Administered 2018-05-19 – 2018-05-21 (×5): 0.25 mg via RESPIRATORY_TRACT
  Filled 2018-05-19 (×5): qty 2

## 2018-05-19 MED ORDER — KCL IN DEXTROSE-NACL 20-5-0.45 MEQ/L-%-% IV SOLN
INTRAVENOUS | Status: DC
  Administered 2018-05-19 – 2018-05-23 (×5): via INTRAVENOUS
  Filled 2018-05-19 (×9): qty 1000

## 2018-05-19 MED ORDER — DILTIAZEM 12 MG/ML ORAL SUSPENSION
60.0000 mg | Freq: Four times a day (QID) | ORAL | Status: DC
  Filled 2018-05-19 (×5): qty 6

## 2018-05-19 MED ORDER — WARFARIN SODIUM 5 MG PO TABS
5.0000 mg | ORAL_TABLET | Freq: Every day | ORAL | Status: DC
  Filled 2018-05-19 (×5): qty 1

## 2018-05-19 MED ORDER — WARFARIN - PHARMACIST DOSING INPATIENT: Freq: Every day | Status: DC

## 2018-05-19 MED ORDER — FAMOTIDINE 20 MG PO TABS
20.0000 mg | ORAL_TABLET | Freq: Every day | ORAL | Status: DC
Start: 2018-05-19 — End: 2018-05-20
  Administered 2018-05-19: 20 mg
  Filled 2018-05-19: qty 1

## 2018-05-19 MED ORDER — MIDAZOLAM HCL 2 MG/2ML IJ SOLN: 2.0000 mg | Freq: Once | INTRAMUSCULAR | Status: DC

## 2018-05-19 MED ORDER — INSULIN GLARGINE 100 UNIT/ML ~~LOC~~ SOLN
40.0000 [IU] | Freq: Every day | SUBCUTANEOUS | Status: DC
  Administered 2018-05-19: 40 [IU] via SUBCUTANEOUS
  Filled 2018-05-19 (×2): qty 0.4

## 2018-05-19 MED ORDER — FENTANYL 25 MCG/HR TD PT72
50.0000 ug | MEDICATED_PATCH | TRANSDERMAL | Status: DC
  Administered 2018-05-21 – 2018-05-24 (×2): 50 ug via TRANSDERMAL
  Filled 2018-05-19 (×2): qty 1

## 2018-05-19 NOTE — Progress Notes (Signed)
Tolerating trach collar at 40% Cognition intact No distress No new complaints  Vitals:   05/19/18 0900 05/19/18 0925 05/19/18 1000 05/19/18 1100  BP: (!) 86/34 116/63 123/77 120/78  Pulse:  (!) 113 (!) 104 (!) 189  Resp: 16 18 14 14   Temp:  98.3 F (36.8 C)    TempSrc:  Oral    SpO2:  91% 93% (!) 89%  Weight:      Height:       Gen: NAD HEENT: NCAT, sclera white Neck: Tracheostomy site clean Lungs: breath sounds full anteriorly without wheezes or other adventitious sounds Cardiovascular: IRIR, rate controlled, no murmurs Abdomen: Obese, soft, nontender, normal BS Ext: No clubbing, cyanosis, edema Neuro: No focal deficit Skin: Limited exam, no lesions noted  BMP Latest Ref Rng & Units 05/19/2018 05/18/2018 05/17/2018  Glucose 70 - 99 mg/dL 161(W154(H) 960(A108(H) 540(J121(H)  BUN 6 - 20 mg/dL 81(X31(H) 91(Y39(H) 78(G48(H)  Creatinine 0.61 - 1.24 mg/dL 9.56(O2.16(H) 1.30(Q2.62(H) 6.57(Q2.37(H)  BUN/Creat Ratio 9 - 20 - - -  Sodium 135 - 145 mmol/L 139 140 138  Potassium 3.5 - 5.1 mmol/L 4.2 3.9 4.2  Chloride 98 - 111 mmol/L 96(L) 95(L) 94(L)  CO2 22 - 32 mmol/L 35(H) 36(H) 36(H)  Calcium 8.9 - 10.3 mg/dL 9.1 9.3 9.3   CBC Latest Ref Rng & Units 05/19/2018 05/18/2018 05/15/2018  WBC 3.8 - 10.6 K/uL 9.6 7.9 6.0  Hemoglobin 13.0 - 18.0 g/dL 11.1(L) 10.9(L) 11.5(L)  Hematocrit 40.0 - 52.0 % 33.5(L) 32.7(L) 34.8(L)  Platelets 150 - 440 K/uL 217 221 221   CXR: No new film  IMPRESSION: Prolonged VDRF MRSA PNA - treated H/O OSA Tracheostomy status VT cardiac arrest 05/31 Chronic AF S/P mechanical AoV replacement Hypertension, controlled CKD, baseline creatinine 2.0-2.5 Unable to place G tube DMs with insulin resistance Severe deconditioning  PLAN/REC: Cont ATC 24 hr/d as tolerated If can go 72 hrs without vent, he may be transferred to Med-Surg Consider downsize trach tube if remains off vent but would not decannulate (due to severe OSA) Cont diltiazem, metoprolol Transition from heparin infusion to  warfarin Monitor BMET intermittently Monitor I/Os Correct electrolytes as indicated  Cont Dobhoff NGT If trach tube can be downsized, we can consider SLP swallow eval Cont Lantus/SSI Cont PT - [facility]  Billy Fischeravid Emaya Preston, MD PCCM service Mobile [phone] Pager [phone] 05/19/2018 12:57 PM

## 2018-05-19 NOTE — Progress Notes (Addendum)
Writing RN entered room for 1200 assessment, pt reported something about Dobhoff tube did not "feel right"  Dobhoff was still taped at 85 cm, but a loop of the tube was visualized in patient's oral cavity. Writing RN tube pulled tube out until loop disappeared in throat, then tube reinserted to 90 cm, no longer visualized in throat, pt report tube felt "right now."  ABD ordered, TF off and Dobhoff clamped at this time Writing RN also informed patient at this time that d/t pink secretions from his trach tube that appear to be originating from the red hard candy he has been eating, the physician does not feel the candy is safe at this time.  Pt agreed and candy was removed from bedside

## 2018-05-19 NOTE — Progress Notes (Signed)
ANTICOAGULATION CONSULT NOTE   Pharmacy Consult for heparin drip management Indication: atrial fibrillation and aortic valve replacement   Allergies  Allergen Reactions  . Lexapro [Escitalopram Oxalate] Swelling    Patient Measurements: Height: 5\' 10"  (177.8 cm) Weight: (!) 354 lb 11.5 oz (160.9 kg) IBW/kg (Calculated) : 73 Heparin Dosing Weight: 121 kg (updated 6/21)  Vital Signs: Temp: 99.8 F (37.7 C) (07/12 0400) Temp Source: Oral (07/12 0400) BP: 107/81 (07/12 0600) Pulse Rate: 89 (07/12 0600)  Labs: Recent Labs    05/17/18 0511  05/17/18 2258 05/18/18 0504 05/18/18 0610 05/19/18 0556  HGB  --   --   --  10.9*  --  11.1*  HCT  --   --   --  32.7*  --  33.5*  PLT  --   --   --  221  --  217  HEPARINUNFRC  --    < > 0.63  --  0.52 0.56  CREATININE 2.37*  --   --  2.62*  --  2.16*   < > = values in this interval not displayed.    Estimated Creatinine Clearance: 63.3 mL/min (A) (by C-G formula based on SCr of 2.16 mg/dL (H)).   Medical History: Past Medical History:  Diagnosis Date  . Allergy   . Anxiety   . Arrhythmia   . Arthritis   . Atrial fibrillation (HCC)   . CHF (congestive heart failure) (HCC)   . CKD (chronic kidney disease)   . Diabetes mellitus without complication (HCC)   . Diabetes mellitus, type II (HCC)   . Heart failure (HCC)   . Hyperlipidemia   . Hypertension   . Pancreatitis   . Sleep apnea     Medications:  Scheduled:  . ALPRAZolam  0.5 mg Per Tube BID  . bisacodyl  10 mg Rectal Once  . budesonide (PULMICORT) nebulizer solution  0.25 mg Nebulization BID  . chlorhexidine  15 mL Mouth Rinse BID  . Chlorhexidine Gluconate Cloth  6 each Topical Q0600  . diltiazem  60 mg Oral Q6H  . famotidine  20 mg Per Tube Daily  . feeding supplement (PRO-STAT SUGAR FREE 64)  30 mL Per Tube Daily  . [START ON 05/21/2018] fentaNYL  50 mcg Transdermal Q72H  . fluvoxaMINE  25 mg Per Tube QHS  . free water  250 mL Per Tube Q6H  . gabapentin  600  mg Per Tube TID  . insulin aspart  0-20 Units Subcutaneous Q4H  . insulin glargine  40 Units Subcutaneous Daily  . ipratropium-albuterol  3 mL Nebulization Q6H  . mouth rinse  15 mL Mouth Rinse 10 times per day  . metoprolol tartrate  50 mg Per Tube BID  . mupirocin ointment  1 application Nasal BID  . senna-docusate  2 tablet Per Tube BID  . sodium chloride flush  10-40 mL Intracatheter Q12H   Infusions:  . feeding supplement (NEPRO CARB STEADY) Stopped (05/19/18 4098)  . heparin 2,000 Units/hr (05/18/18 2320)    Assessment: Pharmacy consulted for warfarin and heparin drip management for 47 yo male admitted to the ICU s/p cardiac arrest. Patient has history significant for aortic valve replacement, atrial fibrillation and CHF. Head CT is negative for infarction or hemorrhage.   Patient takes warfarin 5mg  daily as an outpatient for goal INR 2.5-3.5.   Goal of Therapy:  INR 2-3 Heparin level 0.3-0.7 units/ml Monitor platelets by anticoagulation protocol: Yes   Plan:  Continue heparin infusion at current rate  recheck a HL in 6 hours.    Will continue to hold warfarin, patient continuing to not tolerate feeds.  Pharmacy will continue to monitor and adjust per consult.     7/10 :  HL @ 23:00 = 0.63  7/11: HL @ 0610 = 0.52  7/12: HL @ 0656  = 0.56  Will continue this pt on current rate and recheck HL on 7/13 with AM labs.   Virna Livengood D 05/19/2018

## 2018-05-19 NOTE — Progress Notes (Signed)
[facility] [place] [facility]   PATIENT NAME: Alexander HeadyOtis Hosein    MR#:  [id number]  DATE OF BIRTH:  28-Jul-1971  SUBJECTIVE:   Patient continues to be on a spontaneous mode of breathing  REVIEW OF SYSTEMS:    Review of Systems  Constitutional: Negative for chills and fever.  HENT: Negative for congestion and tinnitus.   Eyes: Negative for blurred vision and double vision.  Respiratory: Negative for cough, shortness of breath and wheezing.   Cardiovascular: Negative for chest pain, orthopnea and PND.  Gastrointestinal: Negative for abdominal pain, diarrhea, nausea and vomiting.  Genitourinary: Negative for dysuria and hematuria.  Neurological: Negative for dizziness, sensory change and focal weakness.  All other systems reviewed and are negative.   Nutrition: Tube feeds  Tolerating Diet: No Tolerating PT: Await Eval.   DRUG ALLERGIES:   Allergies  Allergen Reactions  . Lexapro [Escitalopram Oxalate] Swelling    VITALS:  Blood pressure 111/81, pulse (!) 104, temperature 99.1 F (37.3 C), temperature source Axillary, resp. rate 18, height 5\' 10"  (1.778 m), weight (!) 160.9 kg (354 lb 11.5 oz), SpO2 97 %.  PHYSICAL EXAMINATION:   Physical Exam  GENERAL:  47 y.o.-year-old patient lying in bed s/p Trach on vent and follow simple commands EYES: Pupils equal, round, reactive to light. No scleral icterus. Extraocular muscles intact.  HEENT: Head atraumatic, normocephalic. Oropharynx and nasopharynx clear. S/p Trach and on vent.  NG tube in place  NECK:  Supple, no jugular venous distention. No thyroid enlargement, no tenderness.  LUNGS: Normal breath sounds bilaterally, no wheezing, rales, rhonchi. No use of accessory muscles of respiration.  CARDIOVASCULAR: S1, S2 RRR, + Metallic valve click. No murmurs, rubs, or gallops.  ABDOMEN: Soft, nontender, nondistended. Bowel sounds present. No organomegaly or mass.  EXTREMITIES: No cyanosis, clubbing or edema b/l.     NEUROLOGIC: Cranial nerves II through XII are intact. No focal Motor or sensory deficits b/l. Globally weak  PSYCHIATRIC: The patient is alert and oriented x 3. SKIN: No obvious rash, lesion, or ulcer.    LABORATORY PANEL:   CBC Recent Labs  Lab 05/19/18 0556  WBC 9.6  HGB 11.1*  HCT 33.5*  PLT 217   ------------------------------------------------------------------------------------------------------------------  Chemistries  Recent Labs  Lab 05/16/18 0449  05/19/18 0556  NA 136   < > 139  K 4.5   < > 4.2  CL 93*   < > 96*  CO2 34*   < > 35*  GLUCOSE 91   < > 154*  BUN 58*   < > 31*  CREATININE 2.22*   < > 2.16*  CALCIUM 9.3   < > 9.1  MG 2.1  --   --   AST 28  --   --   ALT 23  --   --   ALKPHOS 62  --   --   BILITOT 1.2  --   --    < > = values in this interval not displayed.   ------------------------------------------------------------------------------------------------------------------  Cardiac Enzymes No results for input(s): TROPONINI in the last 168 hours. ------------------------------------------------------------------------------------------------------------------  RADIOLOGY:  Dg Abd 1 View  Result Date: 05/18/2018 CLINICAL DATA:  Feeding tube placement at bedside. EXAM: ABDOMEN - 1 VIEW COMPARISON:  05/16/2018, 05/04/2018 and earlier, including CT abdomen and pelvis 05/01/2018. FINDINGS: Feeding tube tip in the fundus of the stomach. Visualized bowel gas pattern nonobstructive. IMPRESSION: Feeding tube tip in the fundus of the stomach. Electronically Signed   By: Maisie Fushomas  Lawrence M.D.   On: 05/18/2018 11:42   Dg Abd Portable 1v  Result Date: 05/19/2018 CLINICAL DATA:  Nasogastric tube placement EXAM: PORTABLE ABDOMEN - 1 VIEW COMPARISON:  Portable exam 1253 hours compared to 05/18/2018 FINDINGS: Tip of feeding tube projects over the mid stomach. Postsurgical changes of median sternotomy and valve replacement. Visualized bowel gas pattern normal.  IMPRESSION: Tip of feeding tube projects over the mid stomach. Electronically Signed   By: Ulyses Southward M.D.   On: 05/19/2018 13:29     ASSESSMENT AND PLAN:   47 year old male with past medical history significant for A. fib status post ablation, aortic valve replacement on warfarin, history of congestive heart failure, CKD, morbid obesity, diabetes and sleep apnea who was getting an outpatient MRI of his lower back had a cardiac arrest.  * Acute cardiopulmonary arrest  - Pulseless VT - continue weaning from vent,s/p tracheostomy  - Continuevent for support continue pressure support  * AcuteMRSA/VAP Treated withcourse ofLinezolid - afebrile/hemodynamically stable.   *Atrial fibrillationwith RVR - rate controlled.  - Continuemetoprolol,Cardizem.  - cont. Heparin gtt given pt. Hx of Mechanical aortic valve placement.   * History of mechanical aortic valve replacement - cont. Heparin gtt.   * Acute toxic metabolic encephalopathy  - Neurology input appreciated  - initially Started on Kepprafor possible partial seizures. Later stopped. - mental status back to baseline, no acute seizures.   * Chronic diabetes mellitus type 2 - cont. SSI. BS stable.   * Acute renal failurew/ CKD stage III Secondary toATN, Cr. Close to baseline now.   * N/V -resolved.  Possibly due to constipation.   Improved  * nutrition -had  Dobbhoff tube placement then came out will need it replaced   All the records are reviewed and case discussed with Care Management/Social Worker. Management plans discussed with the patient, family and they are in agreement.  CODE STATUS: full code  DVT Prophylaxis: Heparin gtt  TOTAL TIME TAKING CARE OF THIS PATIENT: 25 minutes.   POSSIBLE D/C unclear, DEPENDING ON CLINICAL CONDITION and progress.    [name] M.D on 05/19/2018 at 3:02 PM  Between 7am to 6pm - Pager - [phone]  After 6pm go to [email or web address] - Air traffic controller  [facility] [place] [facility]  Office  [phone]  CC: Primary care physician; Erasmo Downer, MD

## 2018-05-19 NOTE — Progress Notes (Addendum)
Abd film indicated Dobhoff in good position but when tube feeds were restarted tube was clogged, Writer attempted interventions but tube would not unclog, Writing RN removed tube at that time   Pt would like to wait until tonight or perhaps tomorrow to replace Dobhoff, Per Dr Sung AmabileSimonds we will DC Lantus for now and start IV fluids until dobhoff is in place again and pt can start feedings.  Pt can have 2 mgs Versed prior to replacing dobhoff

## 2018-05-19 NOTE — Progress Notes (Signed)
ANTICOAGULATION CONSULT NOTE   Pharmacy Consult for heparin drip management Indication: atrial fibrillation and aortic valve replacement   Allergies  Allergen Reactions  . Lexapro [Escitalopram Oxalate] Swelling    Patient Measurements: Height: 5\' 10"  (177.8 cm) Weight: (!) 354 lb 11.5 oz (160.9 kg) IBW/kg (Calculated) : 73 Heparin Dosing Weight: 121 kg (updated 6/21)  Vital Signs: Temp: 99.1 F (37.3 C) (07/12 1200) Temp Source: Axillary (07/12 1200) BP: 111/81 (07/12 1300) Pulse Rate: 104 (07/12 1000)  Labs: Recent Labs    05/17/18 0511  05/17/18 2258 05/18/18 0504 05/18/18 0610 05/19/18 0556  HGB  --   --   --  10.9*  --  11.1*  HCT  --   --   --  32.7*  --  33.5*  PLT  --   --   --  221  --  217  HEPARINUNFRC  --    < > 0.63  --  0.52 0.56  CREATININE 2.37*  --   --  2.62*  --  2.16*   < > = values in this interval not displayed.    Estimated Creatinine Clearance: 63.3 mL/min (A) (by C-G formula based on SCr of 2.16 mg/dL (H)).   Medical History: Past Medical History:  Diagnosis Date  . Allergy   . Anxiety   . Arrhythmia   . Arthritis   . Atrial fibrillation (HCC)   . CHF (congestive heart failure) (HCC)   . CKD (chronic kidney disease)   . Diabetes mellitus without complication (HCC)   . Diabetes mellitus, type II (HCC)   . Heart failure (HCC)   . Hyperlipidemia   . Hypertension   . Pancreatitis   . Sleep apnea     Medications:  Scheduled:  . ALPRAZolam  0.5 mg Per Tube BID  . bisacodyl  10 mg Rectal Once  . budesonide (PULMICORT) nebulizer solution  0.25 mg Nebulization BID  . chlorhexidine  15 mL Mouth Rinse BID  . Chlorhexidine Gluconate Cloth  6 each Topical Q0600  . diltiazem  60 mg Per Tube Q6H  . famotidine  20 mg Per Tube Daily  . feeding supplement (PRO-STAT SUGAR FREE 64)  30 mL Per Tube Daily  . [START ON 05/21/2018] fentaNYL  50 mcg Transdermal Q72H  . fluvoxaMINE  25 mg Per Tube QHS  . free water  250 mL Per Tube Q6H  .  gabapentin  600 mg Per Tube TID  . insulin aspart  0-20 Units Subcutaneous Q4H  . insulin glargine  40 Units Subcutaneous Daily  . ipratropium-albuterol  3 mL Nebulization Q6H  . mouth rinse  15 mL Mouth Rinse 10 times per day  . metoprolol tartrate  50 mg Per Tube BID  . mupirocin ointment  1 application Nasal BID  . senna-docusate  2 tablet Per Tube BID  . sodium chloride flush  10-40 mL Intracatheter Q12H  . warfarin  5 mg Per Tube q1800  . Warfarin - Pharmacist Dosing Inpatient   Does not apply q1800   Infusions:  . feeding supplement (NEPRO CARB STEADY) Stopped (05/19/18 1300)  . heparin 2,000 Units/hr (05/19/18 1330)    Assessment: Pharmacy consulted for warfarin and heparin drip management for 47 yo male admitted to the ICU s/p cardiac arrest. Patient has history significant for aortic valve replacement, atrial fibrillation and CHF. Head CT is negative for infarction or hemorrhage.   Patient takes warfarin 5mg  daily as an outpatient for goal INR 2.5-3.5.   Goal of Therapy:  INR 2.5 - 3.5 Heparin level 0.3-0.7 units/ml Monitor platelets by anticoagulation protocol: Yes   Plan:  Continue heparin infusion at current rate recheck heparin level with am labs.  If dobhoff can be replaced this afternoon, will initiate warfarin 5mg  (patient home dose). Goal INR is 2.5-3.5. Patient will need to bridged for minimum of 5 days and two consecutive INRs in target range.   Pharmacy will continue to monitor and adjust per consult.     Deran Barro L 05/19/2018

## 2018-05-19 NOTE — Care Management (Signed)
[facility].

## 2018-05-20 LAB — GLUCOSE, CAPILLARY
GLUCOSE-CAPILLARY: 109 mg/dL — AB (ref 70–99)
GLUCOSE-CAPILLARY: 106 mg/dL — AB (ref 70–99)
GLUCOSE-CAPILLARY: 120 mg/dL — AB (ref 70–99)
GLUCOSE-CAPILLARY: 124 mg/dL — AB (ref 70–99)
Glucose-Capillary: 138 mg/dL — ABNORMAL HIGH (ref 70–99)
Glucose-Capillary: 104 mg/dL — ABNORMAL HIGH (ref 70–99)

## 2018-05-20 LAB — PROTIME-INR
INR: 1.35
Prothrombin Time: 16.6 seconds — ABNORMAL HIGH (ref 11.4–15.2)

## 2018-05-20 LAB — HEPARIN LEVEL (UNFRACTIONATED): Heparin Unfractionated: 0.49 IU/mL (ref 0.30–0.70)

## 2018-05-20 MED ORDER — FENTANYL CITRATE (PF) 100 MCG/2ML IJ SOLN
25.0000 ug | INTRAMUSCULAR | Status: DC | PRN
  Administered 2018-05-20 – 2018-05-21 (×2): 25 ug via INTRAVENOUS
  Filled 2018-05-20 (×2): qty 2

## 2018-05-20 MED ORDER — METOPROLOL TARTRATE 5 MG/5ML IV SOLN: 2.5000 mg | INTRAVENOUS | Status: DC | PRN

## 2018-05-20 MED ORDER — LORAZEPAM 2 MG/ML IJ SOLN
0.5000 mg | INTRAMUSCULAR | Status: DC | PRN
  Administered 2018-05-22 – 2018-05-24 (×6): 1 mg via INTRAVENOUS
  Filled 2018-05-20 (×7): qty 1

## 2018-05-20 MED ORDER — DILTIAZEM HCL-DEXTROSE 100-5 MG/100ML-% IV SOLN (PREMIX)
5.0000 mg/h | INTRAVENOUS | Status: DC
  Administered 2018-05-20: 5 mg/h via INTRAVENOUS
  Administered 2018-05-20 – 2018-05-24 (×8): 10 mg/h via INTRAVENOUS
  Filled 2018-05-20 (×12): qty 100

## 2018-05-20 NOTE — Progress Notes (Signed)
Pt remains NPO with no NG/Dobhoff tube, IV Cardizem stated and  maintained  HR in low 100s and BP 100-120 systolic. Co pain x 1 and medicated for same and sleeping at this time. Continues to move well in bed and practice strengthening exercises. Inner trach #7 Shiley changed and tolerated well.

## 2018-05-20 NOTE — Progress Notes (Signed)
Name: Alexander Alexander MRN:   [id number] DOB:   Feb 17, 1971           ADMISSION DATE:  04/07/2018  PT PROFILE:   19M with PMH of atrial fibrillation, and aortic valve replacement on long term warfarin, CHF, CKD, diabetes, obstructive sleep apnea and hypertension, suffered a pulseless VT cardiac arrest in MRI when he was being evaluated for back pain and sciatica, subsequently intubated in the intensive care unit. Difficult airway noted  EVENTS/RESULTS: 05/31 CT head: NAD 05/31 Echocardiogram: LVEF 55-60%. No vegetations 06/02 CT head: NAD 06/11 CT chest: dense RLL consolidation 06/21 LE venous US: Positive exam for left calf peroneal DVT without propagation into the femoral or popliteal veins. Very low thrombus burden.   INDWELLING DEVICES:: R IJ CVL 05/31 >> 06/10 ETT 05/31 >> 06/18 RUE PICC 06/10 >>  Trach tube 06/18 >>   MICRO DATA: MRSA PCR 05/31 >> NEG Urine 06/01 >> NEG Resp 06/01 >> MRSA Blood 06/01 >> NEG C diff 06/04 >> NEG  Urine 06/05 >> NEG Resp 06/05 >> MRSA Blood 06/05 >> NEG Resp 06/11 >> MRSA C diff 6/17 >> NEG Resp 06/20 >> MRSA Blood 06/20 >> NEG MRSA PCR 07/07 >> POS  ANTIMICROBIALS:  Pip-tazo 05/31 >> 06/03 Vanc 05/31 >> 6/12 Cefepime 06/11 >> 06/15 Linezolid 06/12 >> 06/21  SUBJ: Continues to tolerate trach collar without respiratory distress.  Cognition remains intact.  No new complaints. Yesterday, Dobbhoff tube became clogged.  It was replaced but malpositioned.  Patient refused further attempts at NG tube placement.  Vitals:   05/20/18 0500 05/20/18 0600 05/20/18 0700 05/20/18 0800  BP: 114/89 118/89 (!) 137/99 (!) 136/104  Pulse: (!) 204 (!) 234 (!) 223 (!) 237  Resp: 16 10 (!) 29 (!) 8  Temp:    100.1 F (37.8 C)  TempSrc:      SpO2: 96% 99% 93% 98%  Weight:      Height:       Gen: NAD HEENT: NCAT, sclera white Neck: Tracheostomy site clean Lungs: No adventitious sounds Cardiovascular: IR IR, rate controlled, no M, mechanical  S2 Abdomen: Obese, soft, nontender, normal BS Ext: No C/C/E Neuro: No focal deficit Skin: Limited exam, no lesions noted  BMP Latest Ref Rng & Units 05/19/2018 05/18/2018 05/17/2018  Glucose 70 - 99 mg/dL 782(N) 562(Z) 308(M)  BUN 6 - 20 mg/dL 57(Q) 46(N) 62(X)  Creatinine 0.61 - 1.24 mg/dL 5.28(U) 1.32(G) 4.01(U)  BUN/Creat Ratio 9 - 20 - - -  Sodium 135 - 145 mmol/L 139 140 138  Potassium 3.5 - 5.1 mmol/L 4.2 3.9 4.2  Chloride 98 - 111 mmol/L 96(L) 95(L) 94(L)  CO2 22 - 32 mmol/L 35(H) 36(H) 36(H)  Calcium 8.9 - 10.3 mg/dL 9.1 9.3 9.3   CBC Latest Ref Rng & Units 05/19/2018 05/18/2018 05/15/2018  WBC 3.8 - 10.6 K/uL 9.6 7.9 6.0  Hemoglobin 13.0 - 18.0 g/dL 11.1(L) 10.9(L) 11.5(L)  Hematocrit 40.0 - 52.0 % 33.5(L) 32.7(L) 34.8(L)  Platelets 150 - 440 K/uL 217 221 221   CXR: No new film  IMPRESSION: Prolonged VDRF MRSA PNA - treated H/O OSA Tracheostomy status VT cardiac arrest 05/31 Chronic AF S/P mechanical AoV replacement Hypertension, controlled CKD, baseline creatinine 2.0-2.5 DMs with insulin resistance Severe deconditioning Unable to place G tube and patient refusing NGT  PLAN/REC: Cont ATC 24 hr/d as tolerated If can go 72 hrs without vent, he may be transferred to Med-Surg If remains off vent through today and tonight, will  downsize trach tube 7/14 Change diltiazem to IV Continue metoprolol as needed for rate control Continue heparin infusion Monitor BMET intermittently Monitor I/Os Correct electrolytes as indicated  After trach tube downsized, we will consider SLP swallow eval Cont Lantus/SSI Cont PT - [facility]  Billy Fischeravid Fleur Audino, MD PCCM service Mobile [phone] Pager [phone] 05/20/2018 11:04 AM

## 2018-05-20 NOTE — Plan of Care (Signed)
Patient alert and oriented.  Able to make needs known.  Remained on trach collar all shift.  Able to maintain sats >90%.  A few dips in O2 occurred but did not sustain.  Patient able to cough and deep breathe without difficulty.  Patient does not have Dubhoff at this time.  Offered placement but patient declined. All tube feed medications held this shift per NP Tukov. Patient sat on edge of bed on own. Patient approved to have candy and ice chips at bedside.  Wife at bedside.  Will continue to monitor.

## 2018-05-20 NOTE — Progress Notes (Signed)
ANTICOAGULATION CONSULT NOTE   Pharmacy Consult for heparin drip management Indication: atrial fibrillation and aortic valve replacement   Allergies  Allergen Reactions  . Lexapro [Escitalopram Oxalate] Swelling    Patient Measurements: Height: 5\' 10"  (177.8 cm) Weight: (!) 363 lb 1.6 oz (164.7 kg) IBW/kg (Calculated) : 73 Heparin Dosing Weight: 121 kg (updated 6/21)  Vital Signs: Temp: 99.1 F (37.3 C) (07/13 0200) Temp Source: Oral (07/13 0200) BP: 114/89 (07/13 0500) Pulse Rate: 204 (07/13 0500)  Labs: Recent Labs    05/18/18 0504 05/18/18 0610 05/19/18 0556 05/20/18 0420  HGB 10.9*  --  11.1*  --   HCT 32.7*  --  33.5*  --   PLT 221  --  217  --   LABPROT  --   --   --  16.6*  INR  --   --   --  1.35  HEPARINUNFRC  --  0.52 0.56 0.49  CREATININE 2.62*  --  2.16*  --     Estimated Creatinine Clearance: 64.2 mL/min (A) (by C-G formula based on SCr of 2.16 mg/dL (H)).   Medical History: Past Medical History:  Diagnosis Date  . Allergy   . Anxiety   . Arrhythmia   . Arthritis   . Atrial fibrillation (HCC)   . CHF (congestive heart failure) (HCC)   . CKD (chronic kidney disease)   . Diabetes mellitus without complication (HCC)   . Diabetes mellitus, type II (HCC)   . Heart failure (HCC)   . Hyperlipidemia   . Hypertension   . Pancreatitis   . Sleep apnea     Medications:  Scheduled:  . ALPRAZolam  0.5 mg Per Tube BID  . bisacodyl  10 mg Rectal Once  . budesonide (PULMICORT) nebulizer solution  0.25 mg Nebulization BID  . chlorhexidine  15 mL Mouth Rinse BID  . Chlorhexidine Gluconate Cloth  6 each Topical Q0600  . diltiazem  60 mg Per Tube Q6H  . famotidine  20 mg Per Tube Daily  . feeding supplement (PRO-STAT SUGAR FREE 64)  30 mL Per Tube Daily  . [START ON 05/21/2018] fentaNYL  50 mcg Transdermal Q72H  . fluvoxaMINE  25 mg Per Tube QHS  . free water  250 mL Per Tube Q6H  . gabapentin  600 mg Per Tube TID  . insulin aspart  0-20 Units  Subcutaneous Q4H  . ipratropium-albuterol  3 mL Nebulization Q6H  . mouth rinse  15 mL Mouth Rinse 10 times per day  . metoprolol tartrate  50 mg Per Tube BID  . midazolam  2 mg Intravenous Once  . mupirocin ointment  1 application Nasal BID  . senna-docusate  2 tablet Per Tube BID  . sodium chloride flush  10-40 mL Intracatheter Q12H  . warfarin  5 mg Per Tube q1800  . Warfarin - Pharmacist Dosing Inpatient   Does not apply q1800   Infusions:  . dextrose 5 % and 0.45 % NaCl with KCl 20 mEq/L 50 mL/hr at 05/19/18 1837  . feeding supplement (NEPRO CARB STEADY) Stopped (05/19/18 1300)  . heparin 2,000 Units/hr (05/19/18 1600)    Assessment: Pharmacy consulted for warfarin and heparin drip management for 47 yo male admitted to the ICU s/p cardiac arrest. Patient has history significant for aortic valve replacement, atrial fibrillation and CHF. Head CT is negative for infarction or hemorrhage.   Patient takes warfarin 5mg  daily as an outpatient for goal INR 2.5-3.5.   Goal of Therapy:  INR  2.5 - 3.5 Heparin level 0.3-0.7 units/ml Monitor platelets by anticoagulation protocol: Yes   Plan:  Continue heparin infusion at current rate recheck heparin level with am labs.  If dobhoff can be replaced this afternoon, will initiate warfarin 5mg  (patient home dose). Goal INR is 2.5-3.5. Patient will need to bridged for minimum of 5 days and two consecutive INRs in target range.   7/13: HL @ 0420 = 0.49 Will continue pt on current rate and recheck HL on 7/14 with AM labs.   Pharmacy will continue to monitor and adjust per consult.     Antony Sian D 05/20/2018

## 2018-05-20 NOTE — Evaluation (Addendum)
Occupational TherapyTreatment Patient Details Name: Alexander RiffleOtis Leon Credeur MRN: [id number] DOB: [birth year] Today's Date: 05/20/2018    History of Present Illness Pt. is a 47 y.o. male with PMH of atrial fibrillation, status post ablation and aortic valve replacement on long term warfarin, CHF, CKD, diabetes, obstructive sleep apnea and hypertension. Pt. had a pulseless VT cardiac arrest in MRI when he was being evaluated for back pain and sciatica, subsequently intubated in the intensive care unit. Intubated from 5/31-6/18 when trach placed. Pt for PEG placement tomorrow potentially. Of note, pt also with L LE DVT currently on heparin drip diagnosed on 6/21. Pt NG tube switched to Dobhoff this date and recent complaints of nausea per RN.   Clinical Impression   Pt. with limited tolerance for activity during the treatment session today reporting fatigue from having a lot going on this morning. Pt. BP 118/84, HR 105, SO2 97. Pt. Required increased rest breaks during there. Ex., and was not able to tolerate as many this session, and in previous sessions. Pt. Was assisted ion repositioning. Pt. Continues to benefit from OT services for ADL training, A/E training, and pt. Education about home modification, and DME. Pt. continues to benefit from follow-up OT services upon returning home.    Follow Up Recommendations  LTACH    Equipment Recommendations  Tub/shower seat;Tub/shower bench;3 in 1 bedside commode    Recommendations for Other Services       Precautions / Restrictions Restrictions Weight Bearing Restrictions: No      Mobility Bed Mobility               General bed mobility comments: MOD I for scooting, but overall needs assistance  Transfers                      Balance                                           ADL either performed or assessed with clinical judgement   ADL Overall ADL's : Needs assistance/impaired Eating/Feeding: NPO   Grooming:  Minimal assistance;Bed level   Upper Body Bathing: Total assistance   Lower Body Bathing: Total assistance   Upper Body Dressing : Maximal assistance;Cueing for sequencing Upper Body Dressing Details (indicate cue type and reason): pt donned gown in bed with max A and VC's for arm placement Lower Body Dressing: Total assistance;Bed level   Toilet Transfer: Total assistance           Functional mobility during ADLs: Total assistance       Vision Baseline Vision/History: No visual deficits Patient Visual Report: No change from baseline       Perception     Praxis      Pertinent Vitals/Pain Pain Assessment: No/denies pain Pain Score: 0-No pain Pain Intervention(s): Repositioned;Monitored during session     Hand Dominance Right   Extremity/Trunk Assessment Upper Extremity Assessment Upper Extremity Assessment: Generalized weakness           Communication Communication Communication: Tracheostomy   Cognition Arousal/Alertness: Awake/alert Behavior During Therapy: WFL for tasks assessed/performed Overall Cognitive Status: Within Functional Limits for tasks assessed                                 General Comments: Pt. is alert and able to follow commands  General Comments       Exercises Other Exercises Other Exercises: Pt. performed BUE ther. ex with yellow resistive theraband. Pt. tolerated bilateral shoulder flexion, and right elbow flexion and extension exercises for 1 set of 10 reps. Pt. was unable to do more than this today secondary to fatigue and decreased activity tolerance.   Shoulder Instructions      Home Living Family/patient expects to be discharged to:: Private residence Living Arrangements: Spouse/significant other Available Help at Discharge: Family   Home Access: Level entry     Home Layout: Two level     Bathroom Shower/Tub: Curtain         Home Equipment: Medical laboratory scientific officer - single point          Prior  Functioning/Environment Level of Independence: Independent with assistive device(s)                 OT Problem List: Decreased strength;Decreased activity tolerance      OT Treatment/Interventions: Self-care/ADL training;Therapeutic exercise;Patient/family education;DME and/or AE instruction;Energy conservation    OT Goals(Current goals can be found in the care plan section) Acute Rehab OT Goals Patient Stated Goal: To get stronger OT Goal Formulation: With patient Potential to Achieve Goals: Good  OT Frequency: Min 2X/week   Barriers to D/C:            Co-evaluation              AM-PAC PT "6 Clicks" Daily Activity     Outcome Measure Help from another person eating meals?: Total Help from another person taking care of personal grooming?: A Little Help from another person toileting, which includes using toliet, bedpan, or urinal?: Total Help from another person bathing (including washing, rinsing, drying)?: Total   Help from another person to put on and taking off regular lower body clothing?: Total 6 Click Score: 7   End of Session    Activity Tolerance: Patient limited by fatigue Patient left: in bed;with call bell/phone within reach;with bed alarm set  OT Visit Diagnosis: Muscle weakness (generalized) (M62.81)                Time: 0981-1914 OT Time Calculation (min): 19 min Charges:  OT Treatments $Self Care/Home Management : 8-22 mins G-Codes:     Olegario Messier, MS, OTR/L  Olegario Messier 05/20/2018, 2:02 PM
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
## 2018-05-20 NOTE — Progress Notes (Signed)
[employer] - [place] at [facility]   PATIENT NAME: Alexander Alexander    MR#:  [id number]  DATE OF BIRTH:  1971-08-28  SUBJECTIVE:   Patient remained stable  REVIEW OF SYSTEMS:    Review of Systems  Constitutional: Negative for chills and fever.  HENT: Negative for congestion and tinnitus.   Eyes: Negative for blurred vision and double vision.  Respiratory: Negative for cough, shortness of breath and wheezing.   Cardiovascular: Negative for chest pain, orthopnea and PND.  Gastrointestinal: Negative for abdominal pain, diarrhea, nausea and vomiting.  Genitourinary: Negative for dysuria and hematuria.  Neurological: Negative for dizziness, sensory change and focal weakness.  All other systems reviewed and are negative.   Nutrition: Tube feeds  Tolerating Diet: No Tolerating PT: Await Eval.   DRUG ALLERGIES:   Allergies  Allergen Reactions  . Lexapro [Escitalopram Oxalate] Swelling    VITALS:  Blood pressure 118/84, pulse (!) 224, temperature 100.1 F (37.8 C), resp. rate (!) 23, height 5\' 10"  (1.778 m), weight (!) 164.7 kg (363 lb 1.6 oz), SpO2 99 %.  PHYSICAL EXAMINATION:   Physical Exam  GENERAL:  47 y.o.-year-old patient lying in bed s/p Trach on vent and follow simple commands EYES: Pupils equal, round, reactive to light. No scleral icterus. Extraocular muscles intact.  HEENT: Head atraumatic, normocephalic. Oropharynx and nasopharynx clear. S/p Trach and on vent.  NG tube in place  NECK:  Supple, no jugular venous distention. No thyroid enlargement, no tenderness.  LUNGS: Normal breath sounds bilaterally, no wheezing, rales, rhonchi. No use of accessory muscles of respiration.  CARDIOVASCULAR: S1, S2 RRR, + Metallic valve click. No murmurs, rubs, or gallops.  ABDOMEN: Soft, nontender, nondistended. Bowel sounds present. No organomegaly or mass.  EXTREMITIES: No cyanosis, clubbing or edema b/l.    NEUROLOGIC: Cranial nerves II through XII are intact. No  focal Motor or sensory deficits b/l. Globally weak  PSYCHIATRIC: The patient is alert and oriented x 3. SKIN: No obvious rash, lesion, or ulcer.    LABORATORY PANEL:   CBC Recent Labs  Lab 05/19/18 0556  WBC 9.6  HGB 11.1*  HCT 33.5*  PLT 217   ------------------------------------------------------------------------------------------------------------------  Chemistries  Recent Labs  Lab 05/16/18 0449  05/19/18 0556  NA 136   < > 139  K 4.5   < > 4.2  CL 93*   < > 96*  CO2 34*   < > 35*  GLUCOSE 91   < > 154*  BUN 58*   < > 31*  CREATININE 2.22*   < > 2.16*  CALCIUM 9.3   < > 9.1  MG 2.1  --   --   AST 28  --   --   ALT 23  --   --   ALKPHOS 62  --   --   BILITOT 1.2  --   --    < > = values in this interval not displayed.   ------------------------------------------------------------------------------------------------------------------  Cardiac Enzymes No results for input(s): TROPONINI in the last 168 hours. ------------------------------------------------------------------------------------------------------------------  RADIOLOGY:  Dg Abd Portable 1v  Result Date: 05/19/2018 CLINICAL DATA:  Nasogastric tube placement EXAM: PORTABLE ABDOMEN - 1 VIEW COMPARISON:  Portable exam 1253 hours compared to 05/18/2018 FINDINGS: Tip of feeding tube projects over the mid stomach. Postsurgical changes of median sternotomy and valve replacement. Visualized bowel gas pattern normal. IMPRESSION: Tip of feeding tube projects over the mid stomach. Electronically Signed   By: Ulyses Southward M.D.   On:  05/19/2018 13:29     ASSESSMENT AND PLAN:   47 year old male with past medical history significant for A. fib status post ablation, aortic valve replacement on warfarin, history of congestive heart failure, CKD, morbid obesity, diabetes and sleep apnea who was getting an outpatient MRI of his lower back had a cardiac arrest.  * Acute cardiopulmonary arrest  - Pulseless VT -  continue weaning from vent,s/p tracheostomy  - Continuevent for support as needed   * AcuteMRSA/VAP Treated withcourse ofLinezolid - afebrile/hemodynamically stable.   *Atrial fibrillationwith RVR - rate controlled.  - Continuemetoprolol,Cardizem.  - cont. Heparin gtt given pt. Hx of Mechanical aortic valve placement.   * History of mechanical aortic valve replacement - cont. Heparin gtt.   * Acute toxic metabolic encephalopathy  - Neurology input appreciated  - initially Started on Kepprafor possible partial seizures. Later stopped. - mental status back to baseline, no acute seizures.   * Chronic diabetes mellitus type 2 - cont. SSI. BS stable.   * Acute renal failurew/ CKD stage III Secondary toATN, Cr. Close to baseline now.   * N/V -resolved.  Possibly due to constipation.   Improved  * nutrition -had  Dobbhoff tube placement then came out will need it replaced   All the records are reviewed and case discussed with Care Management/Social Worker. Management plans discussed with the patient, family and they are in agreement.  CODE STATUS: full code  DVT Prophylaxis: Heparin gtt  TOTAL TIME TAKING CARE OF THIS PATIENT: 25 minutes.   POSSIBLE D/C unclear, DEPENDING ON CLINICAL CONDITION and progress.    Auburn BilberryShreyang Ethan Kasperski M.D on 05/20/2018 at 1:40 PM  Between 7am to 6pm - Pager - [phone]  After 6pm go to [email or web address] - Social research officer, governmentpassword EPAS [facility]  [facility] [place] [facility]  Office  [phone]  CC: Primary care physician; Erasmo DownerBacigalupo, Angela M, MD

## 2018-05-21 LAB — CBC
HEMATOCRIT: 33 % — AB (ref 40.0–52.0)
Hemoglobin: 11.1 g/dL — ABNORMAL LOW (ref 13.0–18.0)
MCH: 27.9 pg (ref 26.0–34.0)
MCHC: 33.5 g/dL (ref 32.0–36.0)
MCV: 83.5 fL (ref 80.0–100.0)
Platelets: 209 10*3/uL (ref 150–440)
RBC: 3.96 MIL/uL — ABNORMAL LOW (ref 4.40–5.90)
RDW: 18.4 % — ABNORMAL HIGH (ref 11.5–14.5)
WBC: 7.6 10*3/uL (ref 3.8–10.6)

## 2018-05-21 LAB — GLUCOSE, CAPILLARY
GLUCOSE-CAPILLARY: 107 mg/dL — AB (ref 70–99)
Glucose-Capillary: 106 mg/dL — ABNORMAL HIGH (ref 70–99)
Glucose-Capillary: 105 mg/dL — ABNORMAL HIGH (ref 70–99)
Glucose-Capillary: 122 mg/dL — ABNORMAL HIGH (ref 70–99)
Glucose-Capillary: 90 mg/dL (ref 70–99)

## 2018-05-21 LAB — HEPARIN LEVEL (UNFRACTIONATED): Heparin Unfractionated: 0.37 IU/mL (ref 0.30–0.70)

## 2018-05-21 MED ORDER — ORAL CARE MOUTH RINSE
15.0000 mL | Freq: Two times a day (BID) | OROMUCOSAL | Status: DC
  Administered 2018-05-21 – 2018-05-22 (×2): 15 mL via OROMUCOSAL

## 2018-05-21 MED ORDER — FENTANYL CITRATE (PF) 100 MCG/2ML IJ SOLN
25.0000 ug | INTRAMUSCULAR | Status: DC | PRN
  Administered 2018-05-21 – 2018-05-24 (×8): 25 ug via INTRAVENOUS
  Filled 2018-05-21 (×8): qty 2

## 2018-05-21 MED ORDER — IPRATROPIUM-ALBUTEROL 0.5-2.5 (3) MG/3ML IN SOLN
3.0000 mL | Freq: Three times a day (TID) | RESPIRATORY_TRACT | Status: DC
  Administered 2018-05-21 – 2018-05-25 (×11): 3 mL via RESPIRATORY_TRACT
  Filled 2018-05-21 (×11): qty 3

## 2018-05-21 NOTE — Procedures (Signed)
Tracheostomy tube change  Tracheostomy tube was changed to #6 cuffed regular Shiley without complication.  Stoma is well formed and patent.  Alexander Fischeravid Macy Lingenfelter, MD PCCM service Mobile [phone] Pager [phone] 05/21/2018 11:50 AM

## 2018-05-21 NOTE — Plan of Care (Signed)
Patient tolerating trach collar. Remains NPO. Able to cough up secretions.  Deep suctioning by respiratory x 2 this shift.  No acute concerns at this time.  Able to tolerate patient care and positioning without difficulty.  Patient sitting at edge of bed ad lib.  Will continue to monitor.

## 2018-05-21 NOTE — Progress Notes (Signed)
   05/21/18 1115  Clinical Encounter Type  Visited With Patient and family together  Visit Type Follow-up  Referral From Nurse  Consult/Referral To Chaplain  Spiritual Encounters  Spiritual Needs Prayer;Emotional   [facility] with PT and spouse while rounding on ICU. [facility] that PT first came to the hospital on 07 Apr 2018. PT's wife recognized me and told PT that [facility] was [facility] who set with her when he was being treated for a heart attack on 07 Apr 2018.  PT thanked me and I told wife I would be around to visit later. I offered a silent pray as I departed the RM.

## 2018-05-21 NOTE — Progress Notes (Signed)
[employer] - [place] at [facility]   PATIENT NAME: Alexander HeadyOtis Alexander    MR#:  [id number]  DATE OF BIRTH:  02-26-1971  SUBJECTIVE:  Tracheostomy tube change by the intensivist earlier today  REVIEW OF SYSTEMS:    Review of Systems  Constitutional: Negative for chills and fever.  HENT: Negative for congestion and tinnitus.   Eyes: Negative for blurred vision and double vision.  Respiratory: Negative for cough, shortness of breath and wheezing.   Cardiovascular: Negative for chest pain, orthopnea and PND.  Gastrointestinal: Negative for abdominal pain, diarrhea, nausea and vomiting.  Genitourinary: Negative for dysuria and hematuria.  Neurological: Negative for dizziness, sensory change and focal weakness.  All other systems reviewed and are negative.   Nutrition: Tube feeds  Tolerating Diet: No Tolerating PT: Await Eval.   DRUG ALLERGIES:   Allergies  Allergen Reactions  . Lexapro [Escitalopram Oxalate] Swelling    VITALS:  Blood pressure 127/85, pulse 94, temperature 99 F (37.2 C), temperature source Oral, resp. rate 19, height 5\' 10"  (1.778 m), weight (!) 161.9 kg (356 lb 14.8 oz), SpO2 94 %.  PHYSICAL EXAMINATION:   Physical Exam  GENERAL:  47 y.o.-year-old patient lying in bed s/p Trach on vent and follow simple commands EYES: Pupils equal, round, reactive to light. No scleral icterus. Extraocular muscles intact.  HEENT: Head atraumatic, normocephalic. Oropharynx and nasopharynx clear. S/p Trach and on vent.  NG tube in place  NECK:  Supple, no jugular venous distention. No thyroid enlargement, no tenderness.  LUNGS: Normal breath sounds bilaterally, no wheezing, rales, rhonchi. No use of accessory muscles of respiration.  CARDIOVASCULAR: S1, S2 RRR, + Metallic valve click. No murmurs, rubs, or gallops.  ABDOMEN: Soft, nontender, nondistended. Bowel sounds present. No organomegaly or mass.  EXTREMITIES: No cyanosis, clubbing or edema b/l.     NEUROLOGIC: Cranial nerves II through XII are intact. No focal Motor or sensory deficits b/l. Globally weak  PSYCHIATRIC: The patient is alert and oriented x 3. SKIN: No obvious rash, lesion, or ulcer.    LABORATORY PANEL:   CBC Recent Labs  Lab 05/21/18 0500  WBC 7.6  HGB 11.1*  HCT 33.0*  PLT 209   ------------------------------------------------------------------------------------------------------------------  Chemistries  Recent Labs  Lab 05/16/18 0449  05/19/18 0556  NA 136   < > 139  K 4.5   < > 4.2  CL 93*   < > 96*  CO2 34*   < > 35*  GLUCOSE 91   < > 154*  BUN 58*   < > 31*  CREATININE 2.22*   < > 2.16*  CALCIUM 9.3   < > 9.1  MG 2.1  --   --   AST 28  --   --   ALT 23  --   --   ALKPHOS 62  --   --   BILITOT 1.2  --   --    < > = values in this interval not displayed.   ------------------------------------------------------------------------------------------------------------------  Cardiac Enzymes No results for input(s): TROPONINI in the last 168 hours. ------------------------------------------------------------------------------------------------------------------  RADIOLOGY:  No results found.   ASSESSMENT AND PLAN:   47 year old male with past medical history significant for A. fib status post ablation, aortic valve replacement on warfarin, history of congestive heart failure, CKD, morbid obesity, diabetes and sleep apnea who was getting an outpatient MRI of his lower back had a cardiac arrest.  * Acute cardiopulmonary arrest  - Pulseless VT - continue weaning from vent,s/p tracheostomy  -  Continuevent for support as needed   * AcuteMRSA/VAP Treated withcourse ofLinezolid - afebrile/hemodynamically stable.   *Atrial fibrillationwith RVR - rate controlled.  - Continuemetoprolol,Cardizem.  - cont. Heparin gtt given pt. Hx of Mechanical aortic valve placement.   * History of mechanical aortic valve replacement - cont.  Heparin gtt.   * Acute toxic metabolic encephalopathy  - Neurology input appreciated  - initially Started on Kepprafor possible partial seizures. Later stopped. - mental status back to baseline, no acute seizures.   * Chronic diabetes mellitus type 2 - cont. SSI. BS stable.   * Acute renal failurew/ CKD stage III Secondary toATN, Cr. Close to baseline now.   * N/V -resolved.  Possibly due to constipation.   Improved  * nutrition -had  Dobbhoff tube placement then came out will need it replaced   All the records are reviewed and case discussed with Care Management/Social Worker. Management plans discussed with the patient, family and they are in agreement.  CODE STATUS: full code  DVT Prophylaxis: Heparin gtt  TOTAL TIME TAKING CARE OF THIS PATIENT: 25 minutes.   POSSIBLE D/C unclear, DEPENDING ON CLINICAL CONDITION and progress.    [name] M.D on 05/21/2018 at 2:35 PM  Between 7am to 6pm - Pager - [phone]  After 6pm go to [email or web address] - Social research officer, government  [facility] [place] [facility]  Office  [phone]  CC: Primary care physician; Erasmo Downer, MD

## 2018-05-21 NOTE — Progress Notes (Signed)
Name: Alexander RiffleOtis Leon Alexander MRN:   [id number] DOB:   05/30/1971           ADMISSION DATE:  04/07/2018  PT PROFILE:   18M with PMH of atrial fibrillation, and aortic valve replacement on long term warfarin, CHF, CKD, diabetes, obstructive sleep apnea and hypertension, suffered a pulseless VT cardiac arrest in MRI when he was being evaluated for back pain and sciatica, subsequently intubated in the intensive care unit. Difficult airway noted  EVENTS/RESULTS: 05/31 CT head: NAD 05/31 Echocardiogram: LVEF 55-60%. No vegetations 06/02 CT head: NAD 06/11 CT chest: dense RLL consolidation 06/21 LE venous US: Positive exam for left calf peroneal DVT without propagation into the femoral or popliteal veins. Very low thrombus burden. 07/14 tracheostomy tube changed to #6 Shiley, cuffed   INDWELLING DEVICES:: R IJ CVL 05/31 >> 06/10 ETT 05/31 >> 06/18 RUE PICC 06/10 >>  Trach tube 06/18 >>   MICRO DATA: MRSA PCR 05/31 >> NEG Urine 06/01 >> NEG Resp 06/01 >> MRSA Blood 06/01 >> NEG C diff 06/04 >> NEG  Urine 06/05 >> NEG Resp 06/05 >> MRSA Blood 06/05 >> NEG Resp 06/11 >> MRSA C diff 6/17 >> NEG Resp 06/20 >> MRSA Blood 06/20 >> NEG MRSA PCR 07/07 >> POS  ANTIMICROBIALS:  Pip-tazo 05/31 >> 06/03 Vanc 05/31 >> 6/12 Cefepime 06/11 >> 06/15 Linezolid 06/12 >> 06/21  SUBJ: Remains on trach collar.  Cognition intact.  No distress.  No new complaints.  Vitals:   05/21/18 0800 05/21/18 0900 05/21/18 1000 05/21/18 1100  BP: 116/87 117/78 122/76 130/84  Pulse: 94 92 (!) 105 (!) 102  Resp: 16 19 15 18   Temp: 99.2 F (37.3 C)     TempSrc: Oral     SpO2: 96% 95% 97% 100%  Weight:      Height:       Gen: NAD HEENT: NCAT, sclera white Neck: Tracheostomy site clean Lungs: No adventitious sounds Cardiovascular: IRIR, rate remains controlled, mechanical S2 Abdomen: Obese, soft, nontender, normal BS Ext: No C/C/E Neuro: No focal deficit Skin: Limited exam, no lesions noted  BMP  Latest Ref Rng & Units 05/19/2018 05/18/2018 05/17/2018  Glucose 70 - 99 mg/dL 782(N154(H) 562(Z108(H) 308(M121(H)  BUN 6 - 20 mg/dL 57(Q31(H) 46(N39(H) 62(X48(H)  Creatinine 0.61 - 1.24 mg/dL 5.28(U2.16(H) 1.32(G2.62(H) 4.01(U2.37(H)  BUN/Creat Ratio 9 - 20 - - -  Sodium 135 - 145 mmol/L 139 140 138  Potassium 3.5 - 5.1 mmol/L 4.2 3.9 4.2  Chloride 98 - 111 mmol/L 96(L) 95(L) 94(L)  CO2 22 - 32 mmol/L 35(H) 36(H) 36(H)  Calcium 8.9 - 10.3 mg/dL 9.1 9.3 9.3   CBC Latest Ref Rng & Units 05/21/2018 05/19/2018 05/18/2018  WBC 3.8 - 10.6 K/uL 7.6 9.6 7.9  Hemoglobin 13.0 - 18.0 g/dL 11.1(L) 11.1(L) 10.9(L)  Hematocrit 40.0 - 52.0 % 33.0(L) 33.5(L) 32.7(L)  Platelets 150 - 440 K/uL 209 217 221   CXR: No new film  IMPRESSION: Prolonged VDRF.  He has been on trach collar for more than 72 hours MRSA PNA - treated H/O OSA Tracheostomy status VT cardiac arrest 05/31 Chronic AF S/P mechanical AoV replacement Hypertension, controlled CKD, baseline creatinine 2.0-2.5 DMs with insulin resistance Severe deconditioning Unable to place G tube  Patient refusing NGT placement  PLAN/REC: Cont supplemental oxygen by TC 24 hr/d  Tracheostomy tube changed 7/14 Continue IV diltiazem until able to administer enterally Continue PRN metoprolol to maintain HR <115/min  Continue heparin infusion until able to transition to warfarin  Monitor BMET intermittently Monitor I/Os Correct electrolytes as indicated  SLP swallow eval Holding Lantus until nutrition resumed Cont SSI Cont PT - He would benefit from [facility]  Transfer to MedSurg with cardiac monitoring 7/14.  PCCM service will continue to follow after transfer  Billy Fischer, MD PCCM service Mobile [phone] Pager [phone] 05/21/2018 11:38 AM

## 2018-05-21 NOTE — Progress Notes (Signed)
ANTICOAGULATION CONSULT NOTE   Pharmacy Consult for heparin drip management Indication: atrial fibrillation and aortic valve replacement   Allergies  Allergen Reactions  . Lexapro [Escitalopram Oxalate] Swelling    Patient Measurements: Height: 5\' 10"  (177.8 cm) Weight: (!) 363 lb 1.6 oz (164.7 kg) IBW/kg (Calculated) : 73 Heparin Dosing Weight: 121 kg (updated 6/21)  Vital Signs: Temp: 98.1 F (36.7 C) (07/14 0400) Temp Source: Oral (07/14 0400) BP: 117/87 (07/14 0400) Pulse Rate: 95 (07/14 0400)  Labs: Recent Labs    05/19/18 0556 05/20/18 0420 05/21/18 0500  HGB 11.1*  --  11.1*  HCT 33.5*  --  33.0*  PLT 217  --  209  LABPROT  --  16.6*  --   INR  --  1.35  --   HEPARINUNFRC 0.56 0.49 0.37  CREATININE 2.16*  --   --     Estimated Creatinine Clearance: 64.2 mL/min (A) (by C-G formula based on SCr of 2.16 mg/dL (H)).   Medical History: Past Medical History:  Diagnosis Date  . Allergy   . Anxiety   . Arrhythmia   . Arthritis   . Atrial fibrillation (HCC)   . CHF (congestive heart failure) (HCC)   . CKD (chronic kidney disease)   . Diabetes mellitus without complication (HCC)   . Diabetes mellitus, type II (HCC)   . Heart failure (HCC)   . Hyperlipidemia   . Hypertension   . Pancreatitis   . Sleep apnea     Medications:  Scheduled:  . budesonide (PULMICORT) nebulizer solution  0.25 mg Nebulization BID  . chlorhexidine  15 mL Mouth Rinse BID  . fentaNYL  50 mcg Transdermal Q72H  . insulin aspart  0-20 Units Subcutaneous Q4H  . ipratropium-albuterol  3 mL Nebulization Q6H  . mouth rinse  15 mL Mouth Rinse q12n4p  . senna-docusate  2 tablet Per Tube BID  . sodium chloride flush  10-40 mL Intracatheter Q12H  . warfarin  5 mg Per Tube q1800  . Warfarin - Pharmacist Dosing Inpatient   Does not apply q1800   Infusions:  . dextrose 5 % and 0.45 % NaCl with KCl 20 mEq/L 50 mL/hr at 05/20/18 1748  . diltiazem (CARDIZEM) infusion 10 mg/hr (05/20/18  1822)  . heparin 2,000 Units/hr (05/20/18 1748)    Assessment: Pharmacy consulted for warfarin and heparin drip management for 47 yo male admitted to the ICU s/p cardiac arrest. Patient has history significant for aortic valve replacement, atrial fibrillation and CHF. Head CT is negative for infarction or hemorrhage.   Patient takes warfarin 5mg  daily as an outpatient for goal INR 2.5-3.5.   Goal of Therapy:  INR 2.5 - 3.5 Heparin level 0.3-0.7 units/ml Monitor platelets by anticoagulation protocol: Yes   Plan:  Continue heparin infusion at current rate recheck heparin level with am labs.  If dobhoff can be replaced this afternoon, will initiate warfarin 5mg  (patient home dose). Goal INR is 2.5-3.5. Patient will need to bridged for minimum of 5 days and two consecutive INRs in target range.   7/13: HL @ 0420 = 0.49 Will continue pt on current rate and recheck HL on 7/14 with AM labs.   7/14 :  HL @ 0500 = 0.37 Will continue pt on current rate and recheck HL on 7/15 with AM labs.   Pharmacy will continue to monitor and adjust per consult.     Justyne Roell D 05/21/2018

## 2018-05-22 ENCOUNTER — Inpatient Hospital Stay: Payer: Medicare HMO

## 2018-05-22 LAB — GLUCOSE, CAPILLARY
GLUCOSE-CAPILLARY: 100 mg/dL — AB (ref 70–99)
GLUCOSE-CAPILLARY: 87 mg/dL (ref 70–99)
Glucose-Capillary: 102 mg/dL — ABNORMAL HIGH (ref 70–99)
Glucose-Capillary: 103 mg/dL — ABNORMAL HIGH (ref 70–99)
Glucose-Capillary: 103 mg/dL — ABNORMAL HIGH (ref 70–99)
Glucose-Capillary: 91 mg/dL (ref 70–99)

## 2018-05-22 LAB — CBC
HEMATOCRIT: 34.1 % — AB (ref 40.0–52.0)
Hemoglobin: 11.4 g/dL — ABNORMAL LOW (ref 13.0–18.0)
MCH: 27.9 pg (ref 26.0–34.0)
MCHC: 33.4 g/dL (ref 32.0–36.0)
MCV: 83.3 fL (ref 80.0–100.0)
Platelets: 224 10*3/uL (ref 150–440)
RBC: 4.1 MIL/uL — ABNORMAL LOW (ref 4.40–5.90)
RDW: 18.2 % — AB (ref 11.5–14.5)
WBC: 7.2 10*3/uL (ref 3.8–10.6)

## 2018-05-22 LAB — BASIC METABOLIC PANEL
Anion gap: 12 (ref 5–15)
BUN: 15 mg/dL (ref 6–20)
CHLORIDE: 94 mmol/L — AB (ref 98–111)
CO2: 31 mmol/L (ref 22–32)
Calcium: 8.8 mg/dL — ABNORMAL LOW (ref 8.9–10.3)
Creatinine, Ser: 2.12 mg/dL — ABNORMAL HIGH (ref 0.61–1.24)
GFR calc Af Amer: 40 mL/min — ABNORMAL LOW (ref >60)
GFR calc non Af Amer: 35 mL/min — ABNORMAL LOW (ref >60)
GLUCOSE: 98 mg/dL (ref 70–99)
POTASSIUM: 3.9 mmol/L (ref 3.5–5.1)
Sodium: 137 mmol/L (ref 135–145)

## 2018-05-22 LAB — HEPARIN LEVEL (UNFRACTIONATED): Heparin Unfractionated: 0.41 IU/mL (ref 0.30–0.70)

## 2018-05-22 MED ORDER — ACETAMINOPHEN 650 MG RE SUPP: 650.0000 mg | RECTAL | Status: DC | PRN

## 2018-05-22 MED ORDER — MORPHINE SULFATE (PF) 2 MG/ML IV SOLN
1.0000 mg | INTRAVENOUS | Status: DC | PRN
  Administered 2018-05-22 – 2018-05-25 (×5): 1 mg via INTRAVENOUS
  Filled 2018-05-22 (×5): qty 1

## 2018-05-22 MED ORDER — MORPHINE SULFATE (PF) 2 MG/ML IV SOLN
INTRAVENOUS | Status: AC
  Administered 2018-05-22: 1 mg via INTRAMUSCULAR
  Filled 2018-05-22: qty 1

## 2018-05-22 NOTE — Progress Notes (Signed)
[facility] [place] [facility]   PATIENT NAME: Alexander Alexander    MR#:  [id number]  DATE OF BIRTH:  May 30, 1971  SUBJECTIVE:  Patient currently doing better.  Work with PT some.  REVIEW OF SYSTEMS:    Review of Systems  Constitutional: Negative for chills and fever.  HENT: Negative for congestion and tinnitus.   Eyes: Negative for blurred vision and double vision.  Respiratory: Negative for cough, shortness of breath and wheezing.   Cardiovascular: Negative for chest pain, orthopnea and PND.  Gastrointestinal: Negative for abdominal pain, diarrhea, nausea and vomiting.  Genitourinary: Negative for dysuria and hematuria.  Neurological: Negative for dizziness, sensory change and focal weakness.  All other systems reviewed and are negative.   Nutrition: Tube feeds  Tolerating Diet: No Tolerating PT: Await Eval.   DRUG ALLERGIES:   Allergies  Allergen Reactions  . Lexapro [Escitalopram Oxalate] Swelling    VITALS:  Blood pressure 140/79, pulse 93, temperature 99.2 F (37.3 C), temperature source Oral, resp. rate 18, height 5\' 11"  (1.803 m), weight (!) 160.3 kg (353 lb 4.8 oz), SpO2 98 %.  PHYSICAL EXAMINATION:   Physical Exam  GENERAL:  47 y.o.-year-old patient lying in bed s/p Trach on vent and follow simple commands EYES: Pupils equal, round, reactive to light. No scleral icterus. Extraocular muscles intact.  HEENT: Head atraumatic, normocephalic. Oropharynx and nasopharynx clear. S/p Trach and on vent.  NG tube in place  NECK:  Supple, no jugular venous distention. No thyroid enlargement, no tenderness.  LUNGS: Normal breath sounds bilaterally, no wheezing, rales, rhonchi. No use of accessory muscles of respiration.  CARDIOVASCULAR: S1, S2 RRR, + Metallic valve click. No murmurs, rubs, or gallops.  ABDOMEN: Soft, nontender, nondistended. Bowel sounds present. No organomegaly or mass.  EXTREMITIES: No cyanosis, clubbing or edema b/l.    NEUROLOGIC:  Cranial nerves II through XII are intact. No focal Motor or sensory deficits b/l. Globally weak  PSYCHIATRIC: The patient is alert and oriented x 3. SKIN: No obvious rash, lesion, or ulcer.    LABORATORY PANEL:   CBC Recent Labs  Lab 05/22/18 0529  WBC 7.2  HGB 11.4*  HCT 34.1*  PLT 224   ------------------------------------------------------------------------------------------------------------------  Chemistries  Recent Labs  Lab 05/16/18 0449  05/22/18 0529  NA 136   < > 137  K 4.5   < > 3.9  CL 93*   < > 94*  CO2 34*   < > 31  GLUCOSE 91   < > 98  BUN 58*   < > 15  CREATININE 2.22*   < > 2.12*  CALCIUM 9.3   < > 8.8*  MG 2.1  --   --   AST 28  --   --   ALT 23  --   --   ALKPHOS 62  --   --   BILITOT 1.2  --   --    < > = values in this interval not displayed.   ------------------------------------------------------------------------------------------------------------------  Cardiac Enzymes No results for input(s): TROPONINI in the last 168 hours. ------------------------------------------------------------------------------------------------------------------  RADIOLOGY:  Dg Chest Port 1 View  Result Date: 05/22/2018 CLINICAL DATA:  Respiratory failure. EXAM: PORTABLE CHEST 1 VIEW COMPARISON:  05/11/2018 and 05/06/2018 FINDINGS: Tracheostomy tube appears in good position. PICC tip is at the level of the carina in the superior vena cava. Chronic cardiomegaly. Pulmonary vascularity is normal. Slight bibasilar atelectasis, slightly improved on the right and slightly increased on the left. IMPRESSION: Slight bibasilar  atelectasis, improved on the right and slightly increased on the left. Electronically Signed   By: Francene BoyersJames  Maxwell M.D.   On: 05/22/2018 07:36     ASSESSMENT AND PLAN:   [age] year old male with past medical history significant for A. fib status post ablation, aortic valve replacement on warfarin, history of congestive heart failure, CKD, morbid  obesity, diabetes and sleep apnea who was getting an outpatient MRI of his lower back had a cardiac arrest.  * Acute cardiopulmonary arrest  - Pulseless VT -Incentive spirometry -Passy-Muir valve trial  -Then swallowing  * AcuteMRSA/VAP Treated withcourse ofLinezolid - afebrile/hemodynamically stable.   *Atrial fibrillationwith RVR - rate controlled.  - Continuemetoprolol,Cardizem.  - cont. Heparin gtt given pt. Hx of Mechanical aortic valve placement.   * History of mechanical aortic valve replacement - cont. Heparin gtt.   * Acute toxic metabolic encephalopathy  - Neurology input appreciated  - initially Started on Kepprafor possible partial seizures. Later stopped. - mental status back to baseline, no acute seizures.   * Chronic diabetes mellitus type 2 - cont. SSI. BS stable.   * Acute renal failurew/ CKD stage III Secondary toATN, Cr. Close to baseline now.   * N/V -resolved.  Possibly due to constipation.   Improved  * nutrition -had  Dobbhoff tube placement then came out, swallowing eval's patients hopefully will be able to eat soon  All the records are reviewed and case discussed with Care Management/Social Worker. Management plans discussed with the patient, family and they are in agreement.  CODE STATUS: full code  DVT Prophylaxis: Heparin gtt  TOTAL TIME TAKING CARE OF THIS PATIENT: 25 minutes.   POSSIBLE D/C unclear, DEPENDING ON CLINICAL CONDITION and progress.    Auburn BilberryShreyang Arville Postlewaite M.D on 05/22/2018 at 3:08 PM  Between 7am to 6pm - Pager - [phone]  After 6pm go to [email or web address] - Social research officer, governmentpassword [email or web address] [email or web address]  [facility] [place] [facility]  Office  [phone]  CC: Primary care physician; Erasmo DownerBacigalupo, Angela M, MD

## 2018-05-22 NOTE — Progress Notes (Signed)
Pt stood at bedside with phys therapy earlier today. Per pt request nursing got him up to chair where he sat for a couple of hours.  Tolerated very well. Good pain relief with 1 mg ms04. Med with 1 mg ativan for anxiety with good effect. Pt also had LARGE bm.

## 2018-05-22 NOTE — Evaluation (Signed)
Passy-Muir Speaking Valve - Evaluation Patient Details  Name: Pricilla RiffleOtis Leon Cashatt MRN: [id number] Date of Birth: October 03, 1971  Today's Date: 05/22/2018 Time: 1500-1600 SLP Time Calculation (min) (ACUTE ONLY): 60 min  Past Medical History:  Past Medical History:  Diagnosis Date  . Allergy   . Anxiety   . Arrhythmia   . Arthritis   . Atrial fibrillation (HCC)   . CHF (congestive heart failure) (HCC)   . CKD (chronic kidney disease)   . Diabetes mellitus without complication (HCC)   . Diabetes mellitus, type II (HCC)   . Heart failure (HCC)   . Hyperlipidemia   . Hypertension   . Pancreatitis   . Sleep apnea    Past Surgical History:  Past Surgical History:  Procedure Laterality Date  . ABLATION  08/01/2017  . IR GASTROSTOMY TUBE MOD SED  05/04/2018  . MECHANICAL AORTIC VALVE REPLACEMENT    . PEG PLACEMENT Left 04/28/2018   Procedure: PERCUTANEOUS ENDOSCOPIC GASTROSTOMY (PEG) PLACEMENT;  Surgeon: Pasty Spillersahiliani, Varnita B, MD;  Location: [facility] ENDOSCOPY;  Service: Endoscopy;  Laterality: Left;  . TRACHEOSTOMY TUBE PLACEMENT N/A 04/25/2018   Procedure: TRACHEOSTOMY;  Surgeon: Vernie MurdersJuengel, Paul, MD;  Location: [facility] ORS;  Service: ENT;  Laterality: N/A;   HPI:  Pt is a 47 y.o. male with PMH of atrial fibrillation, s/p ablation and aortic valve replacement on long term warfarin, CHF, CKD, diabetes, obstructive sleep apnea, obesity and hypertension. Pt. had a pulseless VT cardiac arrest in MRI when he was being evaluated for back pain and sciatica, subsequently intubated in the intensive care unit. Intubated from 5/31-6/18 when tracheostomy was placed. pt is not on Trach Collar w/ O2 support. Of note, pt also with L LE DVT currently on heparin drip diagnosed on 6/21. PEG tube has been considered by Surgery but not placed. Pt appears much improved and is sitting in a chair post PT session. He presents w/ alertness and is able to engage in the tx session. Pt has moved out of CCU into a regular room; on  trach collar O2 support.   Assessment / Plan / Recommendation Clinical Impression  Pt was seen today for a re-assessment of toleration of PMV placement for verbal communication w/ others. Pt is much improved w/ regard to alertness and ability to engage w/ others; much improved medically and in respiratory status. Pt is tolerating trach collar w/ cuff deflated at baseline(~3 days per report). Adequate O2 sats and vital signs per NSG. Pt's trach was downsized to a #6 Shiley trach and is on trach collar O2 support. O2 sats remained in the 90s during this session; before and during PMV placement. No increase in O2 support was needed during the session to maintain O2 sats. Pt demonstrated good follow through w/ tasks and verbalized appropriately w/ adequate volume and vocal quality w/ SLP and Wife during PMV placement. Few verbal cues necessary to engage in PMV tasks w/ SLP. PMV then removed after ~25 mins, and pt allowed to rest; he was experiencing discomfort which NSG was aware of. NSG updated on eval results and recommendations for PMV use intermittently w/ Supervision - NSG to place/remove PMV. Recommend monitoring of pt's status/presentation while wearing PMV for up to 30 mins at this time(to avoid fatigue). Recommend continued ST f/u for independent use of PMV for verbal communication w/ others and in ADLs; education on care of PMV.   SLP Visit Diagnosis: Aphonia (R49.1)(d/t tracheostomy)    SLP Assessment  [facility]  Follow Up Recommendations  (TBD)    Frequency and Duration min 2x/week  1 week    PMSV Trial PMSV was placed for: 25 mins Able to redirect subglottic air through upper airway: Yes Able to Attain Phonation: Yes Voice Quality: Normal Able to Expectorate Secretions: No attempts Level of Secretion Expectoration with PMSV: Not observed Breath Support for Phonation: Adequate Intelligibility: Intelligible Word: 75-100%  accurate Phrase: 75-100% accurate Sentence: 75-100% accurate Conversation: 75-100% accurate Respirations During Trial: 24 SpO2 During Trial: 99 % Pulse During Trial: 107 Behavior: Alert;Cooperative;Expresses self well;Good eye contact;Responsive to questions   Tracheostomy Tube  Additional Tracheostomy Tube Assessment Fenestrated: No Trach Collar Period: on trach collar at baseline w/ cuff deflated at baseline Secretion Description: min-mod; suctioned PRN per NSG report Frequency of Tracheal Suctioning: PRN Level of Secretion Expectoration: Tracheal    Vent Dependency  Vent Dependent: No    Cuff Deflation Trial  GO Tolerated Cuff Deflation: Yes Length of Time for Cuff Deflation Trial: cuff deflated at baseline; tolerating well Behavior: Alert;Cooperative;Expresses self well;Good eye contact;Smiling Cuff Deflation Trial - Comments: n/a          Karl Erway 05/22/2018, 4:49 PM Jerilynn Som, MS, CCC-SLP

## 2018-05-22 NOTE — Progress Notes (Signed)
Physical Therapy Treatment Patient Details Name: Alexander Alexander MRN: [id number] DOB: [birth year] Today's Date: 05/22/2018    History of Present Illness Pt. is a 47 y.o. male with PMH of atrial fibrillation, status post ablation and aortic valve replacement on long term warfarin, CHF, CKD, diabetes, obstructive sleep apnea and hypertension. Pt. had a pulseless VT cardiac arrest in MRI when he was being evaluated for back pain and sciatica, subsequently intubated in the intensive care unit. Intubated from 5/31-6/18 when trach placed. Pt for PEG placement tomorrow potentially. Of note, pt also with L LE DVT currently on heparin drip diagnosed on 6/21. Pt NG tube switched to Dobhoff this date and recent complaints of nausea per RN.    PT Comments    Pt making excellent progress with therapy. He is received sitting upright at EOB after finishing with occupational therapist. He is able to perform sitting exercises with therapist with intermittent rest breaks. Pt requires minA+2 for sit to stand transfers. Requires cues for safe hand placement during transfers. Performed transfers x 2 with patient and he demonstrates improved stability in standing with BUE support on rolling walker. He is able to take a couple short, shuffling steps forward/backward at EOB. Pt is also able to take a few small steps to the left to move up toward [facility]. SaO2 remains at least 95% throughout entire PT session. Pt will benefit from PT services to address deficits in strength, balance, and mobility in order to return to full function at home.    Follow Up Recommendations  SNF;LTACH     Equipment Recommendations  (TBD at next venue of care)    Recommendations for Other Services       Precautions / Restrictions Precautions Precautions: Fall Restrictions Weight Bearing Restrictions: No    Mobility  Bed Mobility Overal bed mobility: Needs Assistance Bed Mobility: Supine to Sit     Supine to sit: Min guard;HOB  elevated     General bed mobility comments: CGA x1 for sup>sit EOB with additional time/effort  Transfers Overall transfer level: Needs assistance Equipment used: Rolling walker (2 wheeled) Transfers: Sit to/from Stand Sit to Stand: +2 physical assistance(plus one more for line/lead)        Lateral/Scoot Transfers: Min assist;+2 physical assistance General transfer comment: Pt requires minA+2 for sit to stand transfers. Requires cues for safe hand placement during transfers. Performed transfers x 2 with patient and he demonstrates improved stability in standing with BUE support on rolling walker  Ambulation/Gait Ambulation/Gait assistance: Min assist;+2 physical assistance Gait Distance (Feet): 3 Feet Assistive device: Rolling walker (2 wheeled)       General Gait Details: Pt is able to take a couple short, shuffling steps forward/backward at EOB. Pt is also able to take a few small steps to the left to move up toward [facility]   Stairs             Wheelchair Mobility    Modified Rankin (Stroke Patients Only)       Balance Overall balance assessment: Needs assistance Sitting-balance support: No upper extremity supported;Feet supported Sitting balance-Leahy Scale: Good Sitting balance - Comments: pt displays safe sitting blance EOB with good postural control.   Standing balance support: Bilateral upper extremity supported Standing balance-Leahy Scale: Fair Standing balance comment: Pt. was able to maintain standing with RW and minA+2                            Cognition  Arousal/Alertness: Awake/alert Behavior During Therapy: WFL for tasks assessed/performed Overall Cognitive Status: Within Functional Limits for tasks assessed                                        Exercises General Exercises - Lower Extremity Long Arc Quad: Both;10 reps Heel Slides: Both;10 reps Hip ABduction/ADduction: Both;10 reps Hip Flexion/Marching: Both;10  reps Other Exercises Other Exercises: Pt tolerated sitting EOB for approx 25 min to perform grooming tasks with min assist to complete back of head. VSS t/o    General Comments        Pertinent Vitals/Pain Pain Assessment: Faces Pain Score: 10-Worst pain ever Faces Pain Scale: Hurts little more Pain Location: sciatica RLE Pain Descriptors / Indicators: Grimacing;Discomfort Pain Intervention(s): Monitored during session    Home Living Family/patient expects to be discharged to:: Private residence                    Prior Function            PT Goals (current goals can now be found in the care plan section) Acute Rehab PT Goals Patient Stated Goal: To get stronger PT Goal Formulation: With patient Time For Goal Achievement: 05/31/18 Potential to Achieve Goals: Fair Progress towards PT goals: Progressing toward goals    Frequency    Min 2X/week      PT Plan Current plan remains appropriate    Co-evaluation              AM-PAC PT "6 Clicks" Daily Activity  Outcome Measure  Difficulty turning over in bed (including adjusting bedclothes, sheets and blankets)?: A Little Difficulty moving from lying on back to sitting on the side of the bed? : A Lot Difficulty sitting down on and standing up from a chair with arms (e.g., wheelchair, bedside commode, etc,.)?: A Little Help needed moving to and from a bed to chair (including a wheelchair)?: A Lot Help needed walking in hospital room?: Total Help needed climbing 3-5 steps with a railing? : Total 6 Click Score: 12    End of Session Equipment Utilized During Treatment: Gait belt Activity Tolerance: Patient tolerated treatment well Patient left: in bed;with family/visitor present(MD in room. Pt left sitting upright at EOB) Nurse Communication: Mobility status(Pt needs suctioning) PT Visit Diagnosis: Muscle weakness (generalized) (M62.81);Difficulty in walking, not elsewhere classified (R26.2);Other symptoms  and signs involving the nervous system (Z61.096(R29.898)     Time: [id number] PT Time Calculation (min) (ACUTE ONLY): 17 min  Charges:  $Therapeutic Exercise: 8-22 mins                    G Codes:       Sharalyn InkJason D Eathel Pajak PT, DPT, GCS    Alexander Alexander 05/22/2018, 2:31 PM

## 2018-05-22 NOTE — Progress Notes (Signed)
Nutrition Follow-up  DOCUMENTATION CODES:   Morbid obesity  INTERVENTION:   RD will monitor for diet advancement vs the need for nutrition support   NUTRITION DIAGNOSIS:   Inadequate oral intake related to inability to eat as evidenced by NPO status.  GOAL:   Patient will meet greater than or equal to 90% of their needs  Met with TF regimen.  MONITOR:   Diet advancement, Labs, Weight trends, Skin, I & O's  ASSESSMENT:   47 year old male with PMHx of A-fib s/p ablation and mechanical aortic valve replacement on warfarin, HTN, DM type 2, CHF, OSA, anxiety, CKD, HLD, pancreatitis who presented with lower back pain and sciatica. While in MRI on 5/31 suite patient suffered pulseless V. tach cardiac arrest with 5 minutes ACLS before ROSC obtained, and was intubated.   Patient s/p placement of tracheostomy tube on 6/18. OGT was removed that day and replaced with an NGT. -PEG tube was unable to be placed on 6/21 due to inability to find good one-to-one indentation and transillumination for safe placement. -IR attempted to place G-tube on 6/27. Tube was unable to be placed as a safe percutaneous window could not be identified. -Per chart patient is not a good candidate for surgically placed G-tube.  Pt s/p multiple failed attempts to keep NGT in place. Pt currently refusing NGT replacement. Pt NPO pending SLP evaluation. Trach tube downsized; hopefully pt will be able to start on oral diet. If pt unable to advance diet, nutrition support will have to be revisited. Per chart, pt with weight fluctuations; it is difficult to determine if pt has had any true weight loss. RD will monitor for SLP recommendations and order supplements if diet advanced.   Medications reviewed and include: insulin, warfarin, NaCl w/ 5% dextrose _0 /hr  Labs reviewed: creat 2.12(H), Ca 8.8(L) cbgs- 103, 100, 87 x 24 hrs AIC 11.7(H)- 5/22  Diet Order:   Diet Order           Diet NPO time specified Except  for: Ice Chips  Diet effective now         EDUCATION NEEDS:   No education needs have been identified at this time  Skin:  Skin Assessment: Skin Integrity Issues: Skin Integrity Issues:: Incisions Stage II: N/A Incisions: closed incision to neck  Last BM:  7/15- type 4  Height:   Ht Readings from Last 1 Encounters:  05/21/18 _1  (1.803 m)    Weight:   Wt Readings from Last 1 Encounters:  05/22/18 (!) 353 lb 4.8 oz (160.3 kg)    Ideal Body Weight:  75.5 kg  BMI:  Body mass index is 49.28 kg/m.  Estimated Nutritional Needs:   Kcal:  8350-7573 (MSJ x 1.1-1.2)  Protein:  140-170 grams (1.85-2.25 grams/kg IBW; 0.8-1 grams/kg admission wt)  Fluid:  1.8-2.3 L/day (25-30 mL/kg IBW)  Koleen Distance MS, RD, LDN Pager #- [phone] Office#- [phone] After Hours Pager: [phone]

## 2018-05-22 NOTE — Progress Notes (Signed)
[facility]    PT PROFILE: [phone] with PMH of atrial fibrillation, and aortic valve replacement on long term warfarin, CHF, CKD, diabetes, obstructive sleep apnea and hypertension, suffered a pulseless VT cardiac arrest in MRI when he was being evaluated for back pain and sciatica, subsequently intubated in the intensive care unit. Difficult airway noted    Assessment and Plan:  -- AcuteMRSA/VAP Treated withcourse ofLinezolid - afebrile/hemodynamically stable.   Acute hypoxic respiratory failure. -Continue trach collar, currently at 40%. -Wean down trach collar as tolerated.  Will consider advancing to Passy-Muir valve as as tolerated.  Obstructive sleep apnea. - Patient was previously on CPAP, noncompliant. - Will require reevaluation once patient is decannulated.   Date: 05/22/2018  MRN# [id number] Alexander Alexander 01/14/1971   Alexander Alexander is a 47 y.o. old male seen in follow up for chief complaint of  Chief Complaint  Patient presents with  . Back Pain      Subjective: Patient currently on trach collar at 12 L, 40%.  He remains on Coumadin for mechanical valve, being dosed by pharmacy services. He has no new complaints today, he feels that his breathing is doing ok, he does feel that he has a lot of trach secretions.    EVENTS/RESULTS: 5/31   MRI/Cardiac arrest>>ROSC 05/31 CT head: NAD 05/31 Echocardiogram: LVEF 55-60%. No vegetations 06/02 CT head: NAD 06/11 CT chest: dense RLL consolidation 06/21 LE venous US: Positive exam for left calf peroneal DVT without propagation into the femoral or popliteal veins. Very low thrombus burden. 07/14 tracheostomy tube changed to #6 Shiley, cuffed   INDWELLING DEVICES:: R IJ CVL 05/31 >> 06/10 ETT 05/31 >> 06/18 RUE PICC 06/10 >> Trach tube 06/18 >>  MICRO DATA: MRSA PCR 05/31 >> NEG Urine 06/01 >> NEG Resp 06/01 >>MRSA Blood 06/01 >> NEG C diff 06/04 >>NEG  Urine 06/05 >>  NEG Resp 06/05 >> MRSA Blood 06/05 >> NEG Resp 06/11 >>MRSA C diff 6/17 >> NEG Resp 06/20 >> MRSA Blood 06/20 >> NEG MRSA PCR 07/07 >> POS  ANTIMICROBIALS:  Pip-tazo 05/31 >> 06/03 Vanc 05/31 >> 6/12 Cefepime 06/11 >> 06/15 Linezolid 06/12 >>06/21      Medication:    Current Facility-Administered Medications:  .  acetaminophen (TYLENOL) suppository 650 mg, 650 mg, Rectal, Q4H PRN, [name], MD .  acetaminophen (TYLENOL) tablet 650 mg, 650 mg, Per Tube, Q6H PRN, Salary, Montell D, MD .  chlorhexidine (PERIDEX) 0.12 % solution 15 mL, 15 mL, Mouth Rinse, BID, Conforti, John, DO, 15 mL at 05/20/18 2308 .  dextrose 5 % and 0.45 % NaCl with KCl 20 mEq/L infusion, , Intravenous, Continuous, Merwyn Katos, MD, Last Rate: 50 mL/hr at 05/22/18 0601 .  diltiazem (CARDIZEM) 100 mg in dextrose 5% (1 mg/mL) infusion, 5-15 mg/hr, Intravenous, Titrated, Merwyn Katos, MD, Last Rate: 10 mL/hr at 05/21/18 2318, 10 mg/hr at 05/21/18 2318 .  fentaNYL (DURAGESIC - dosed mcg/hr) 50 mcg, 50 mcg, Transdermal, Q72H, Merwyn Katos, MD, 50 mcg at 05/21/18 0805 .  fentaNYL (SUBLIMAZE) injection 25 mcg, 25 mcg, Intravenous, Q2H PRN, Merwyn Katos, MD, 25 mcg at 05/22/18 0120 .  heparin ADULT infusion 100 units/mL (25000 units/265mL sodium chloride 0.45%), 2,000 Units/hr, Intravenous, Continuous, [name], MD, Last Rate: 20 mL/hr at 05/21/18 2316, 2,000 Units/hr at 05/21/18 2316 .  hydrALAZINE (APRESOLINE) injection 10 mg, 10 mg, Intravenous, Q2H PRN, Conforti, John, DO, 10 mg at 05/11/18 0110 .  insulin aspart (  novoLOG) injection 0-20 Units, 0-20 Units, Subcutaneous, Q4H, Merwyn KatosSimonds, David B, MD, 3 Units at 05/21/18 1731 .  ipratropium-albuterol (DUONEB) 0.5-2.5 (3) MG/3ML nebulizer solution 3 mL, 3 mL, Nebulization, Q8H, Merwyn KatosSimonds, David B, MD, 3 mL at 05/22/18 0720 .  LORazepam (ATIVAN) injection 0.5-1 mg, 0.5-1 mg, Intravenous, Q4H PRN, Merwyn KatosSimonds, David B, MD, 1 mg at 05/22/18  0058 .  MEDLINE mouth rinse, 15 mL, Mouth Rinse, q12n4p, Tukov-Yual, Magdalene S, NP, 15 mL at 05/21/18 1225 .  metoprolol tartrate (LOPRESSOR) injection 2.5-5 mg, 2.5-5 mg, Intravenous, Q3H PRN, Merwyn KatosSimonds, David B, MD .  ondansetron [facility](ZOFRAN) injection 4 mg, 4 mg, Intravenous, Q6H PRN, Conforti, John, DO, 4 mg at 05/17/18 1103 .  senna-docusate (Senokot-S) tablet 2 tablet, 2 tablet, Per Tube, BID, [place] SirenSainani, Vivek J, MD, 2 tablet at 05/19/18 0926 .  sodium chloride flush (NS) 0.9 % injection 10-40 mL, 10-40 mL, Intracatheter, Q12H, Pyreddy, Pavan, MD, 10 mL at 05/21/18 2026 .  sodium chloride flush (NS) 0.9 % injection 10-40 mL, 10-40 mL, Intracatheter, PRN, Pyreddy, Pavan, MD .  warfarin (COUMADIN) tablet 5 mg, 5 mg, Per Tube, q1800, Auburn BilberryPatel, Shreyang, MD .  Warfarin - Pharmacist Dosing Inpatient, , Does not apply, q1800, Auburn BilberryPatel, Shreyang, MD   Allergies:  Lexapro [escitalopram oxalate]      LABORATORY PANEL:   CBC Recent Labs  Lab 05/22/18 0529  WBC 7.2  HGB 11.4*  HCT 34.1*  PLT 224   ------------------------------------------------------------------------------------------------------------------  Chemistries  Recent Labs  Lab 05/16/18 0449  05/22/18 0529  NA 136   < > 137  K 4.5   < > 3.9  CL 93*   < > 94*  CO2 34*   < > 31  GLUCOSE 91   < > 98  BUN 58*   < > 15  CREATININE 2.22*   < > 2.12*  CALCIUM 9.3   < > 8.8*  MG 2.1  --   --   AST 28  --   --   ALT 23  --   --   ALKPHOS 62  --   --   BILITOT 1.2  --   --    < > = values in this interval not displayed.   ------------------------------------------------------------------------------------------------------------------  Cardiac Enzymes No results for input(s): TROPONINI in the last 168 hours. ------------------------------------------------------------  RADIOLOGY:   No results found for this or any previous visit. Results for orders placed during the hospital encounter of 12/06/17  DG Chest 2 View    Narrative CLINICAL DATA:  Shortness of breath.  Hypoxia.  EXAM: CHEST  2 VIEW  COMPARISON:  11/16/2017.  FINDINGS: Prior median sternotomy and cardiac valve replacement. Cardiomegaly with pulmonary vascular prominence. Similar findings on prior exam. Slight improvement in left base atelectasis. Stable elevation left hemidiaphragm. No pleural effusion or pneumothorax. Degenerative changes thoracic spine.  IMPRESSION: 1. Prior median sternotomy and cardiac valve replacement. Stable cardiomegaly and pulmonary venous congestion. No overt pulmonary edema.  2. Mild improvement of left base atelectasis. Persistent elevation left hemidiaphragm.   Electronically Signed   By: Maisie Fushomas  Register   On: 12/06/2017 10:16    ------------------------------------------------------------------------------------------------------------------  Thank  you for [facility] to assist in the care of your patient. Our recommendations are noted above.  Please contact us if we can be of further service.   Wells Guileseep Khyan Oats, M.D., F.C.C.P.  Board Certified in Internal Medicine, Pulmonary Medicine, Critical Care Medicine, and Sleep Medicine.  [facility] Office Number:  [phone]  05/22/2018  

## 2018-05-22 NOTE — Progress Notes (Signed)
Occupational Therapy Treatment Patient Details Name: Pricilla RiffleOtis Leon Gupta MRN: [id number] DOB: 1971/10/27 Today's Date: 05/22/2018    History of present illness Pt. is a 47 y.o. male with PMH of atrial fibrillation, status post ablation and aortic valve replacement on long term warfarin, CHF, CKD, diabetes, obstructive sleep apnea and hypertension. Pt. had a pulseless VT cardiac arrest in MRI when he was being evaluated for back pain and sciatica, subsequently intubated in the intensive care unit. Intubated from 5/31-6/18 when trach placed. Pt for PEG placement tomorrow potentially. Of note, pt also with L LE DVT currently on heparin drip diagnosed on 6/21. Pt NG tube switched to Dobhoff this date and recent complaints of nausea per RN.   OT comments  Pt seen for OT tx this date. Pt agreeable to therapy, family member in room. Pt verbalizing doing well. Having significant pain 2/2 sciatica, per pt. Pt performed sup>sit EOB with close supervision to CGA from therapy. Pt tolerated sitting EOB to shave his head with his electric razor from home requiring close SBA after set up. No overt LOB or fatigue noted. VSS t/o session. Pt progressing towards OT goals. Continues to benefit from skilled OT services to maximize return to PLOF.   Follow Up Recommendations  LTACH    Equipment Recommendations  Tub/shower seat;Tub/shower bench;3 in 1 bedside commode    Recommendations for Other Services      Precautions / Restrictions Precautions Precautions: Fall Restrictions Weight Bearing Restrictions: No       Mobility Bed Mobility Overal bed mobility: Needs Assistance Bed Mobility: Supine to Sit     Supine to sit: Min guard;HOB elevated     General bed mobility comments: CGA x1 for sup>sit EOB with additional time/effort  Transfers Overall transfer level: Needs assistance   Transfers: Lateral/Scoot Transfers          Lateral/Scoot Transfers: Supervision;Min guard;+2 safety/equipment       Balance Overall balance assessment: Needs assistance Sitting-balance support: No upper extremity supported;Feet supported Sitting balance-Leahy Scale: Good                                     ADL either performed or assessed with clinical judgement   ADL Overall ADL's : Needs assistance/impaired     Grooming: Sitting;Minimal assistance;Supervision/safety;Set up Grooming Details (indicate cue type and reason): Pt sat EOB to wash his face and shave his head with his electric razor from home. Min assist to complete back/neck area which pt could not reach.                                      Vision Baseline Vision/History: Wears glasses Wears Glasses: At all times Patient Visual Report: No change from baseline     Perception     Praxis      Cognition Arousal/Alertness: Awake/alert Behavior During Therapy: WFL for tasks assessed/performed Overall Cognitive Status: Within Functional Limits for tasks assessed                                          Exercises Other Exercises Other Exercises: Pt tolerated sitting EOB for approx 25 min to perform grooming tasks with min assist to complete back of head. VSS t/o   Shoulder  Instructions       General Comments      Pertinent Vitals/ Pain       Pain Assessment: 0-10 Pain Score: 10-Worst pain ever Pain Location: sciatica  Pain Descriptors / Indicators: Grimacing;Discomfort Pain Intervention(s): Limited activity within patient's tolerance;Monitored during session;Repositioned  Home Living Family/patient expects to be discharged to:: Private residence                                        Prior Functioning/Environment              Frequency  Min 2X/week        Progress Toward Goals  OT Goals(current goals can now be found in the care plan section)  Progress towards OT goals: Progressing toward goals  Acute Rehab OT Goals Patient Stated Goal: To  get stronger OT Goal Formulation: With patient Potential to Achieve Goals: Good  Plan Discharge plan remains appropriate;Frequency remains appropriate    Co-evaluation                 AM-PAC PT "6 Clicks" Daily Activity     Outcome Measure   Help from another person eating meals?: A Little(NPO, but could physically perform) Help from another person taking care of personal grooming?: A Little Help from another person toileting, which includes using toliet, bedpan, or urinal?: A Lot Help from another person bathing (including washing, rinsing, drying)?: A Lot Help from another person to put on and taking off regular upper body clothing?: A Lot Help from another person to put on and taking off regular lower body clothing?: A Lot 6 Click Score: 14    End of Session    OT Visit Diagnosis: Muscle weakness (generalized) (M62.81)   Activity Tolerance Patient tolerated treatment well   Patient Left in bed;with call bell/phone within reach;with family/visitor present;Other (comment)(PT in room for session, pt sitting EOB)   Nurse Communication          Time: 1610-9604 OT Time Calculation (min): 32 min  Charges: OT General Charges $OT Visit: 1 Visit OT Treatments $Self Care/Home Management : 23-37 mins   Richrd Prime, MPH, MS, OTR/L ascom [phone] 05/22/18, 2:10 PM

## 2018-05-22 NOTE — Progress Notes (Signed)
Pt refused sx at this time 

## 2018-05-22 NOTE — Progress Notes (Signed)
ANTICOAGULATION CONSULT NOTE   Pharmacy Consult for heparin drip management Indication: atrial fibrillation and aortic valve replacement   Allergies  Allergen Reactions  . Lexapro [Escitalopram Oxalate] Swelling    Patient Measurements: Height: 5\' 11"  (180.3 cm) Weight: (!) 353 lb 4.8 oz (160.3 kg) IBW/kg (Calculated) : 75.3 Heparin Dosing Weight: 121 kg (updated 6/21)  Vital Signs: Temp: 99.2 F (37.3 C) (07/15 0400) Temp Source: Oral (07/15 0400) BP: 140/79 (07/15 0400) Pulse Rate: 93 (07/15 0400)  Labs: Recent Labs    05/20/18 0420 05/21/18 0500 05/22/18 0529  HGB  --  11.1* 11.4*  HCT  --  33.0* 34.1*  PLT  --  209 224  LABPROT 16.6*  --   --   INR 1.35  --   --   HEPARINUNFRC 0.49 0.37 0.41  CREATININE  --   --  2.12*    Estimated Creatinine Clearance: 65.2 mL/min (A) (by C-G formula based on SCr of 2.12 mg/dL (H)).   Medical History: Past Medical History:  Diagnosis Date  . Allergy   . Anxiety   . Arrhythmia   . Arthritis   . Atrial fibrillation (HCC)   . CHF (congestive heart failure) (HCC)   . CKD (chronic kidney disease)   . Diabetes mellitus without complication (HCC)   . Diabetes mellitus, type II (HCC)   . Heart failure (HCC)   . Hyperlipidemia   . Hypertension   . Pancreatitis   . Sleep apnea     Medications:  Scheduled:  . chlorhexidine  15 mL Mouth Rinse BID  . fentaNYL  50 mcg Transdermal Q72H  . insulin aspart  0-20 Units Subcutaneous Q4H  . ipratropium-albuterol  3 mL Nebulization Q8H  . mouth rinse  15 mL Mouth Rinse q12n4p  . senna-docusate  2 tablet Per Tube BID  . sodium chloride flush  10-40 mL Intracatheter Q12H  . warfarin  5 mg Per Tube q1800  . Warfarin - Pharmacist Dosing Inpatient   Does not apply q1800   Infusions:  . dextrose 5 % and 0.45 % NaCl with KCl 20 mEq/L 50 mL/hr at 05/22/18 0601  . diltiazem (CARDIZEM) infusion 10 mg/hr (05/21/18 2318)  . heparin 2,000 Units/hr (05/21/18 2316)     Assessment: Pharmacy consulted for warfarin and heparin drip management for 47 yo male admitted to the ICU s/p cardiac arrest. Patient has history significant for aortic valve replacement, atrial fibrillation and CHF. Head CT is negative for infarction or hemorrhage.   Patient takes warfarin 5mg  daily as an outpatient for goal INR 2.5-3.5.   Goal of Therapy:  INR 2.5 - 3.5 Heparin level 0.3-0.7 units/ml Monitor platelets by anticoagulation protocol: Yes   Plan:  Continue heparin infusion at current rate recheck heparin level with am labs.  If dobhoff can be replaced this afternoon, will initiate warfarin 5mg  (patient home dose). Goal INR is 2.5-3.5. Patient will need to bridged for minimum of 5 days and two consecutive INRs in target range.   7/13: HL @ 0420 = 0.49 Will continue pt on current rate and recheck HL on 7/14 with AM labs.   7/14 :  HL @ 0500 = 0.37 Will continue pt on current rate and recheck HL on 7/15 with AM labs.   7/15 :  HL @ 0529 = 0.41 Will continue pt on current rate and recheck HL on 7/16 with AM labs.   Pharmacy will continue to monitor and adjust per consult.     Zaydenn Balaguer D 05/22/2018

## 2018-05-22 NOTE — Progress Notes (Signed)
Pt's trach suctioned for small amt.thin tan secretions.  Tol. Well.

## 2018-05-23 ENCOUNTER — Inpatient Hospital Stay: Payer: Self-pay

## 2018-05-23 ENCOUNTER — Inpatient Hospital Stay: Payer: Medicare HMO

## 2018-05-23 LAB — BASIC METABOLIC PANEL
ANION GAP: 11 (ref 5–15)
BUN: 11 mg/dL (ref 6–20)
CALCIUM: 9 mg/dL (ref 8.9–10.3)
CO2: 30 mmol/L (ref 22–32)
Chloride: 97 mmol/L — ABNORMAL LOW (ref 98–111)
Creatinine, Ser: 2.06 mg/dL — ABNORMAL HIGH (ref 0.61–1.24)
GFR, EST AFRICAN AMERICAN: 42 mL/min — AB (ref >60)
GFR, EST NON AFRICAN AMERICAN: 36 mL/min — AB (ref >60)
Glucose, Bld: 125 mg/dL — ABNORMAL HIGH (ref 70–99)
Potassium: 3.8 mmol/L (ref 3.5–5.1)
SODIUM: 138 mmol/L (ref 135–145)

## 2018-05-23 LAB — URINALYSIS, COMPLETE (UACMP) WITH MICROSCOPIC
BACTERIA UA: NONE SEEN
BILIRUBIN URINE: NEGATIVE
Glucose, UA: NEGATIVE mg/dL
KETONES UR: 5 mg/dL — AB
NITRITE: NEGATIVE
PROTEIN: 30 mg/dL — AB
SPECIFIC GRAVITY, URINE: 1.006 (ref 1.005–1.030)
pH: 6 (ref 5.0–8.0)

## 2018-05-23 LAB — CBC
HEMATOCRIT: 35.5 % — AB (ref 40.0–52.0)
Hemoglobin: 11.7 g/dL — ABNORMAL LOW (ref 13.0–18.0)
MCH: 27.6 pg (ref 26.0–34.0)
MCHC: 33.1 g/dL (ref 32.0–36.0)
MCV: 83.3 fL (ref 80.0–100.0)
Platelets: 229 10*3/uL (ref 150–440)
RBC: 4.26 MIL/uL — ABNORMAL LOW (ref 4.40–5.90)
RDW: 18.2 % — AB (ref 11.5–14.5)
WBC: 7.7 10*3/uL (ref 3.8–10.6)

## 2018-05-23 LAB — GLUCOSE, CAPILLARY
GLUCOSE-CAPILLARY: 119 mg/dL — AB (ref 70–99)
GLUCOSE-CAPILLARY: 118 mg/dL — AB (ref 70–99)
Glucose-Capillary: 102 mg/dL — ABNORMAL HIGH (ref 70–99)
Glucose-Capillary: 109 mg/dL — ABNORMAL HIGH (ref 70–99)
Glucose-Capillary: 121 mg/dL — ABNORMAL HIGH (ref 70–99)
Glucose-Capillary: 130 mg/dL — ABNORMAL HIGH (ref 70–99)

## 2018-05-23 LAB — PROTIME-INR
INR: 1.27
PROTHROMBIN TIME: 15.8 s — AB (ref 11.4–15.2)

## 2018-05-23 LAB — HEPARIN LEVEL (UNFRACTIONATED)
HEPARIN UNFRACTIONATED: 0.31 [IU]/mL (ref 0.30–0.70)
Heparin Unfractionated: 0.39 IU/mL (ref 0.30–0.70)
Heparin Unfractionated: 0.52 IU/mL (ref 0.30–0.70)

## 2018-05-23 MED ORDER — ALPRAZOLAM 0.5 MG PO TABS
0.2500 mg | ORAL_TABLET | Freq: Once | ORAL | Status: AC
  Administered 2018-05-25: 0.25 mg via ORAL
  Filled 2018-05-23: qty 1

## 2018-05-23 NOTE — Progress Notes (Signed)
ANTICOAGULATION CONSULT NOTE   Pharmacy Consult for heparin drip management Indication: atrial fibrillation and aortic valve replacement   Allergies  Allergen Reactions  . Lexapro [Escitalopram Oxalate] Swelling    Patient Measurements: Height: 5\' 11"  (180.3 cm) Weight: (!) 354 lb 1.6 oz (160.6 kg) IBW/kg (Calculated) : 75.3 Heparin Dosing Weight: 121 kg (updated 6/21)  Vital Signs: Temp: 99.9 F (37.7 C) (07/16 1108) Temp Source: Axillary (07/16 1108) BP: 149/113 (07/16 1040) Pulse Rate: 118 (07/16 1130)  Labs: Recent Labs    05/21/18 0500 05/22/18 0529 05/23/18 0443 05/23/18 1137  HGB 11.1* 11.4* 11.7*  --   HCT 33.0* 34.1* 35.5*  --   PLT 209 224 229  --   LABPROT  --   --  15.8*  --   INR  --   --  1.27  --   HEPARINUNFRC 0.37 0.41 0.31 0.39  CREATININE  --  2.12* 2.06*  --     Estimated Creatinine Clearance: 67.1 mL/min (A) (by C-G formula based on SCr of 2.06 mg/dL (H)).   Medical History: Past Medical History:  Diagnosis Date  . Allergy   . Anxiety   . Arrhythmia   . Arthritis   . Atrial fibrillation (HCC)   . CHF (congestive heart failure) (HCC)   . CKD (chronic kidney disease)   . Diabetes mellitus without complication (HCC)   . Diabetes mellitus, type II (HCC)   . Heart failure (HCC)   . Hyperlipidemia   . Hypertension   . Pancreatitis   . Sleep apnea     Medications:  Scheduled:  . chlorhexidine  15 mL Mouth Rinse BID  . fentaNYL  50 mcg Transdermal Q72H  . insulin aspart  0-20 Units Subcutaneous Q4H  . ipratropium-albuterol  3 mL Nebulization Q8H  . mouth rinse  15 mL Mouth Rinse q12n4p  . senna-docusate  2 tablet Per Tube BID  . sodium chloride flush  10-40 mL Intracatheter Q12H  . warfarin  5 mg Per Tube q1800  . Warfarin - Pharmacist Dosing Inpatient   Does not apply q1800   Infusions:  . dextrose 5 % and 0.45 % NaCl with KCl 20 mEq/L 50 mL/hr at 05/23/18 0112  . diltiazem (CARDIZEM) infusion 10 mg/hr (05/23/18 1610)  .  heparin 2,050 Units/hr (05/23/18 9604)    Assessment: Pharmacy consulted for warfarin and heparin drip management for 47 yo male admitted to the ICU s/p cardiac arrest. Patient has history significant for aortic valve replacement, atrial fibrillation and CHF. Head CT is negative for infarction or hemorrhage.   Patient takes warfarin 5mg  daily as an outpatient for goal INR 2.5-3.5.   Goal of Therapy:  INR 2.5 - 3.5 Heparin level 0.3-0.7 units/ml Monitor platelets by anticoagulation protocol: Yes   Plan:  Continue heparin infusion at current rate recheck heparin level with am labs.  If dobhoff can be replaced, will initiate warfarin 5mg  (patient home dose). Goal INR is 2.5-3.5. Patient will need to bridged for minimum of 5 days and two consecutive INRs in target range.   7/13: HL @ 0420 = 0.49 Will continue pt on current rate and recheck HL on 7/14 with AM labs.   7/14 :  HL @ 0500 = 0.37 Will continue pt on current rate and recheck HL on 7/15 with AM labs.   7/15 :  HL @ 0529 = 0.41 Will continue pt on current rate and recheck HL on 7/16 with AM labs.   07/16 @ 0500 HL 0.31  therapeutic, but trending down from prior results. Will increase rate slightly to 2050 units/hr and will recheck anti-Xa @ 1100, CBC stable.  7/16  HL@ 1137= 0.39. Will continue current Heparin drip rate and recheck with am labs. Continue to monitor for ability to start warfarin. Pharmacy will continue to monitor and adjust per consult.     Bari MantisKristin Apollos Tenbrink PharmD Clinical Pharmacist 05/23/2018

## 2018-05-23 NOTE — Progress Notes (Signed)
Pt refused sx at this time 

## 2018-05-23 NOTE — Consult Note (Signed)
Alexander Alexander, Alexander Alexander [id number] December 08, 1970 Alexander Bilberry, MD   Reason for Consult: Alexander Alexander has come out  HPI: recent trache done with 7 xlt by Dr. Elenore Alexander.  Was downsized to 6 shiley several days ago.  The trache came out yesterday and attempts to put back in were unsuccessful.  A 4 shiley was attempted but it too did not go in an couldn'Alexander be suctioned.  The trache was left out and I was asked to evaluate.  Patient on 2 L Alexander Alexander sats 100%. No stridor.    Allergies:  Allergies  Allergen Reactions  . Lexapro [Escitalopram Oxalate] Swelling    ROS: Review of systems normal other than 12 systems except per HPI.  PMH:  Past Medical History:  Diagnosis Date  . Allergy   . Anxiety   . Arrhythmia   . Arthritis   . Atrial fibrillation (HCC)   . CHF (congestive heart failure) (HCC)   . CKD (chronic kidney disease)   . Diabetes mellitus without complication (HCC)   . Diabetes mellitus, type II (HCC)   . Heart failure (HCC)   . Hyperlipidemia   . Hypertension   . Pancreatitis   . Sleep apnea     FH:  Family History  Problem Relation Age of Onset  . CAD Mother   . Hypertension Mother   . Hypertension Father   . Asthma Father   . Dementia Father   . Diabetes Brother   . Depression Brother   . Hypertension Brother   . Hypertension Brother   . Depression Brother   . Pancreatic cancer Paternal Aunt        several aunts and uncles with pancreatic cancer on paternal side  . Breast cancer Maternal Aunt   . Colon cancer Neg Hx   . Prostate cancer Neg Hx     SH:  Social History   Socioeconomic History  . Marital status: Married    Spouse name: Alexander Alexander  . Number of children: 0  . Years of education: Not on file  . Highest education level: Master's degree (e.g., MA, MS, MEng, MEd, MSW, MBA)  Occupational History  . Occupation: retired    Comment: Building control surveyor  . Occupation: permanent disability  Social Needs  . Financial resource strain: Not hard at all  . Food insecurity:   Worry: Never true    Inability: Never true  . Transportation needs:    Medical: No    Non-medical: No  Tobacco Use  . Smoking status: Former Smoker    Packs/day: 0.50    Years: 20.00    Pack years: 10.00    Types: Cigarettes    Last attempt to quit: 04/25/2014    Years since quitting: 4.0  . Smokeless tobacco: Never Used  . Tobacco comment: quit around june 2015  Substance and Sexual Activity  . Alcohol use: No    Frequency: Never  . Drug use: Not Currently    Types: Cocaine    Comment: January 28, 2017 quit Cocaine  . Sexual activity: Never  Lifestyle  . Physical activity:    Days per week: 0 days    Minutes per session: 0 min  . Stress: To some extent  Relationships  . Social connections:    Talks on phone: Three times a week    Gets together: Three times a week    Attends religious service: Never    Active member of club or organization: No    Attends meetings of clubs or organizations: Never  Relationship status: Married  . Intimate partner violence:    Fear of current or ex partner: No    Emotionally abused: No    Physically abused: No    Forced sexual activity: No  Other Topics Concern  . Not on file  Social History Narrative  . Not on file    PSH:  Past Surgical History:  Procedure Laterality Date  . ABLATION  08/01/2017  . IR GASTROSTOMY TUBE MOD SED  05/04/2018  . MECHANICAL AORTIC VALVE REPLACEMENT    . PEG PLACEMENT Left 04/28/2018   Procedure: PERCUTANEOUS ENDOSCOPIC GASTROSTOMY (PEG) PLACEMENT;  Surgeon: Alexander Alexander, Alexander B, MD;  Location: [facility] ENDOSCOPY;  Service: Endoscopy;  Laterality: Left;  . TRACHEOSTOMY TUBE PLACEMENT N/A 04/25/2018   Procedure: TRACHEOSTOMY;  Surgeon: Vernie MurdersJuengel, Paul, MD;  Location: [facility] ORS;  Service: ENT;  Laterality: N/A;    Physical  Exam: CN 2-12 grossly intact and symmetric. EAC/TMs normal BL. Oral cavity, lips, gums, ororpharynx normal with no masses or lesions. Skin warm and dry. Nasal cavity without polyps or  purulence. External nose and ears without masses or lesions. EOMI, PERRLA. 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 No lymphadenopathy palpated.    A/P: s/p spontaneous decanulation-patient stable on 2L [place] with sats 100%.  Voice good when fistula occluded.  I suspect the passage of the 4 shiley likely was a false passage and made the airway worse.  I do not think it is safe to try to pass another trache at the bedside, and he required xlt trache from Dr. Elenore RotaJuengel due to neck anatomy.  Right now his airway is fine and oxygenating well.  If he has respiratory distress, he can be reintubated from above and we can revise the trache site on the OR.  The site now has almost healed.  Discussed with Intensive care attending and he agrees.    Alexander Alexander Alexander Alexander 05/23/2018 11:17 AM

## 2018-05-23 NOTE — Progress Notes (Signed)
Physician staff unable to reinsert the pt's trach.  During the attempt the patient was very anxious, confirming that he couldn't breathe.  Janina Mayorach was not reinserted.  As he calmed down his saturations became stable in the high 90's.  At this time his breathing is regular. He is not c/o of being SOB, or having trouble breathing.

## 2018-05-23 NOTE — Progress Notes (Signed)
OT Cancellation Note  Patient Details Name: Pricilla RiffleOtis Leon Storr MRN: [id number] DOB: 25-Feb-1971   Cancelled Treatment:    Reason Eval/Treat Not Completed: Medical issues which prohibited therapy. Chart reviewed. Per RT overnight pt decannulated himself and they were unable to successfully reinsert another cannula. Pt currently on continuous pulse oximetry and awaiting ENT or wound care to look at stoma site. Pt currently not appropriate for OT treatment until respiratory status has stabilized. Will continue to follow and see at later time/date as pt is appropriate.  Richrd PrimeJamie Stiller, MPH, MS, OTR/L ascom [phone] 05/23/18, 10:35 AM

## 2018-05-23 NOTE — Progress Notes (Signed)
ANTICOAGULATION CONSULT NOTE   Pharmacy Consult for heparin drip management Indication: atrial fibrillation and aortic valve replacement   Allergies  Allergen Reactions  . Lexapro [Escitalopram Oxalate] Swelling    Patient Measurements: Height: 5\' 11"  (180.3 cm) Weight: (!) 354 lb 1.6 oz (160.6 kg) IBW/kg (Calculated) : 75.3 Heparin Dosing Weight: 114 kg (updated 7/16)  Vital Signs: Temp: 99.9 F (37.7 C) (07/16 1400) Temp Source: Axillary (07/16 1400) BP: 174/103 (07/16 1900) Pulse Rate: 96 (07/16 1900)  Labs: Recent Labs    05/21/18 0500 05/22/18 0529 05/23/18 0443 05/23/18 1137 05/23/18 1943  HGB 11.1* 11.4* 11.7*  --   --   HCT 33.0* 34.1* 35.5*  --   --   PLT 209 224 229  --   --   LABPROT  --   --  15.8*  --   --   INR  --   --  1.27  --   --   HEPARINUNFRC 0.37 0.41 0.31 0.39 0.52  CREATININE  --  2.12* 2.06*  --   --     Estimated Creatinine Clearance: 67.1 mL/min (A) (by C-G formula based on SCr of 2.06 mg/dL (H)).   Medical History: Past Medical History:  Diagnosis Date  . Allergy   . Anxiety   . Arrhythmia   . Arthritis   . Atrial fibrillation (HCC)   . CHF (congestive heart failure) (HCC)   . CKD (chronic kidney disease)   . Diabetes mellitus without complication (HCC)   . Diabetes mellitus, type II (HCC)   . Heart failure (HCC)   . Hyperlipidemia   . Hypertension   . Pancreatitis   . Sleep apnea     Medications:  Scheduled:  . ALPRAZolam  0.25 mg Oral Once  . chlorhexidine  15 mL Mouth Rinse BID  . fentaNYL  50 mcg Transdermal Q72H  . insulin aspart  0-20 Units Subcutaneous Q4H  . ipratropium-albuterol  3 mL Nebulization Q8H  . mouth rinse  15 mL Mouth Rinse q12n4p  . senna-docusate  2 tablet Per Tube BID  . sodium chloride flush  10-40 mL Intracatheter Q12H  . Warfarin - Pharmacist Dosing Inpatient   Does not apply q1800   Infusions:  . dextrose 5 % and 0.45 % NaCl with KCl 20 mEq/L 50 mL/hr at 05/23/18 1949  . diltiazem  (CARDIZEM) infusion 10 mg/hr (05/23/18 1949)  . heparin 2,050 Units/hr (05/23/18 1949)    Assessment: Pharmacy consulted for warfarin and heparin drip management for 47 yo male admitted to the ICU s/p cardiac arrest. Patient has history significant for aortic valve replacement, atrial fibrillation and CHF. Head CT is negative for infarction or hemorrhage.   Patient takes warfarin 5mg  daily as an outpatient for goal INR 2.5-3.5.   Goal of Therapy:  INR 2.5 - 3.5 Heparin level 0.3-0.7 units/ml Monitor platelets by anticoagulation protocol: Yes   Plan:  Heparin level within range. Continue heparin infusion at current rate recheck heparin level with AM labs.  Patient is still refusing tube placement, will initiate warfarin 5mg  (patient home dose). Goal INR is 2.5-3.5. Patient will need to bridged for minimum of 5 days and two consecutive INRs in target range.   Pharmacy will continue to monitor and adjust per consult.     Clovia CuffLisa Taneah Masri, PharmD, BCPS 05/23/2018 8:25 PM

## 2018-05-23 NOTE — Progress Notes (Signed)
ANTICOAGULATION CONSULT NOTE   Pharmacy Consult for heparin drip management Indication: atrial fibrillation and aortic valve replacement   Allergies  Allergen Reactions  . Lexapro [Escitalopram Oxalate] Swelling    Patient Measurements: Height: 5\' 11"  (180.3 cm) Weight: (!) 354 lb 1.6 oz (160.6 kg) IBW/kg (Calculated) : 75.3 Heparin Dosing Weight: 114 kg (updated 7/16)  Vital Signs: Temp: 99.9 F (37.7 C) (07/16 1108) Temp Source: Axillary (07/16 1108) BP: 149/113 (07/16 1040) Pulse Rate: 118 (07/16 1130)  Labs: Recent Labs    05/21/18 0500 05/22/18 0529 05/23/18 0443 05/23/18 1137  HGB 11.1* 11.4* 11.7*  --   HCT 33.0* 34.1* 35.5*  --   PLT 209 224 229  --   LABPROT  --   --  15.8*  --   INR  --   --  1.27  --   HEPARINUNFRC 0.37 0.41 0.31 0.39  CREATININE  --  2.12* 2.06*  --     Estimated Creatinine Clearance: 67.1 mL/min (A) (by C-G formula based on SCr of 2.06 mg/dL (H)).   Medical History: Past Medical History:  Diagnosis Date  . Allergy   . Anxiety   . Arrhythmia   . Arthritis   . Atrial fibrillation (HCC)   . CHF (congestive heart failure) (HCC)   . CKD (chronic kidney disease)   . Diabetes mellitus without complication (HCC)   . Diabetes mellitus, type II (HCC)   . Heart failure (HCC)   . Hyperlipidemia   . Hypertension   . Pancreatitis   . Sleep apnea     Medications:  Scheduled:  . chlorhexidine  15 mL Mouth Rinse BID  . fentaNYL  50 mcg Transdermal Q72H  . insulin aspart  0-20 Units Subcutaneous Q4H  . ipratropium-albuterol  3 mL Nebulization Q8H  . mouth rinse  15 mL Mouth Rinse q12n4p  . senna-docusate  2 tablet Per Tube BID  . sodium chloride flush  10-40 mL Intracatheter Q12H  . Warfarin - Pharmacist Dosing Inpatient   Does not apply q1800   Infusions:  . dextrose 5 % and 0.45 % NaCl with KCl 20 mEq/L 50 mL/hr at 05/23/18 0112  . diltiazem (CARDIZEM) infusion 10 mg/hr (05/23/18 16100607)  . heparin 2,050 Units/hr (05/23/18 [id number])     Assessment: Pharmacy consulted for warfarin and heparin drip management for 47 yo male admitted to the ICU s/p cardiac arrest. Patient has history significant for aortic valve replacement, atrial fibrillation and CHF. Head CT is negative for infarction or hemorrhage.   Patient takes warfarin 5mg  daily as an outpatient for goal INR 2.5-3.5.   Goal of Therapy:  INR 2.5 - 3.5 Heparin level 0.3-0.7 units/ml Monitor platelets by anticoagulation protocol: Yes   Plan:  Continue heparin infusion at current rate recheck heparin level at 1800 for verification of dose am dose change.   Patient is still refusing tube placement, will initiate warfarin 5mg  (patient home dose). Goal INR is 2.5-3.5. Patient will need to bridged for minimum of 5 days and two consecutive INRs in target range.   Pharmacy will continue to monitor and adjust per consult.     MLS 05/23/2018

## 2018-05-23 NOTE — Progress Notes (Signed)
Patient transferred to the ICU for post trach monitoring.  He says he feels well. Saturations continue to be stable and in the mig to high 90's on 4L [place].  He will be monitored through the night for his tolerance.

## 2018-05-23 NOTE — Progress Notes (Signed)
ANTICOAGULATION CONSULT NOTE   Pharmacy Consult for heparin drip management Indication: atrial fibrillation and aortic valve replacement   Allergies  Allergen Reactions  . Lexapro [Escitalopram Oxalate] Swelling    Patient Measurements: Height: 5\' 11"  (180.3 cm) Weight: (!) 354 lb 1.6 oz (160.6 kg) IBW/kg (Calculated) : 75.3 Heparin Dosing Weight: 121 kg (updated 6/21)  Vital Signs: Temp: 99 F (37.2 C) (07/16 0349) Temp Source: Oral (07/16 0349) BP: 135/88 (07/16 0349) Pulse Rate: 109 (07/16 0349)  Labs: Recent Labs    05/21/18 0500 05/22/18 0529 05/23/18 0443  HGB 11.1* 11.4* 11.7*  HCT 33.0* 34.1* 35.5*  PLT 209 224 229  LABPROT  --   --  15.8*  INR  --   --  1.27  HEPARINUNFRC 0.37 0.41 0.31  CREATININE  --  2.12* 2.06*    Estimated Creatinine Clearance: 67.1 mL/min (A) (by C-G formula based on SCr of 2.06 mg/dL (H)).   Medical History: Past Medical History:  Diagnosis Date  . Allergy   . Anxiety   . Arrhythmia   . Arthritis   . Atrial fibrillation (HCC)   . CHF (congestive heart failure) (HCC)   . CKD (chronic kidney disease)   . Diabetes mellitus without complication (HCC)   . Diabetes mellitus, type II (HCC)   . Heart failure (HCC)   . Hyperlipidemia   . Hypertension   . Pancreatitis   . Sleep apnea     Medications:  Scheduled:  . chlorhexidine  15 mL Mouth Rinse BID  . fentaNYL  50 mcg Transdermal Q72H  . insulin aspart  0-20 Units Subcutaneous Q4H  . ipratropium-albuterol  3 mL Nebulization Q8H  . mouth rinse  15 mL Mouth Rinse q12n4p  . senna-docusate  2 tablet Per Tube BID  . sodium chloride flush  10-40 mL Intracatheter Q12H  . warfarin  5 mg Per Tube q1800  . Warfarin - Pharmacist Dosing Inpatient   Does not apply q1800   Infusions:  . dextrose 5 % and 0.45 % NaCl with KCl 20 mEq/L 50 mL/hr at 05/23/18 0112  . diltiazem (CARDIZEM) infusion 10 mg/hr (05/23/18 [id number])  . heparin 2,000 Units/hr (05/23/18 0520)     Assessment: Pharmacy consulted for warfarin and heparin drip management for 47 yo male admitted to the ICU s/p cardiac arrest. Patient has history significant for aortic valve replacement, atrial fibrillation and CHF. Head CT is negative for infarction or hemorrhage.   Patient takes warfarin 5mg  daily as an outpatient for goal INR 2.5-3.5.   Goal of Therapy:  INR 2.5 - 3.5 Heparin level 0.3-0.7 units/ml Monitor platelets by anticoagulation protocol: Yes   Plan:  Continue heparin infusion at current rate recheck heparin level with am labs.  If dobhoff can be replaced this afternoon, will initiate warfarin 5mg  (patient home dose). Goal INR is 2.5-3.5. Patient will need to bridged for minimum of 5 days and two consecutive INRs in target range.   7/13: HL @ 0420 = 0.49 Will continue pt on current rate and recheck HL on 7/14 with AM labs.   7/14 :  HL @ 0500 = 0.37 Will continue pt on current rate and recheck HL on 7/15 with AM labs.   7/15 :  HL @ 0529 = 0.41 Will continue pt on current rate and recheck HL on 7/16 with AM labs.   07/16 @ 0500 HL 0.31 therapeutic, but trending down from prior results. Will increase rate slightly to 2050 units/hr and will recheck anti-Xa @  1100, CBC stable.  Pharmacy will continue to monitor and adjust per consult.     Thomasene Ripple, PharmD, BCPS Clinical Pharmacist 05/23/2018

## 2018-05-23 NOTE — Progress Notes (Signed)
Upon entering patient's room to do breathing treatment observed patient siting at end of bed having increased difficulty with breathing. Listened to patient's back while obtaining pt saturations,  breath sounds were very diminished, and  Oxygen saturations were 80%.  Turned up pt oxygen until a saturations was reached of 94%, approx 12 liters at 45%.  HR was increasing and pt anxiety as well.  I noticed pt trach was extended from neck approx two inches and told him I needed to make it snug to his neck since the trach was a much shorter one since they had to replace it with a 4 during the night,.  I tried to pass a 14 and 10 size suction catheter with inner canula remove.  Neither would pass.  I called the patient's RN and explained circumstance told her I was going to CCU to get equipment needed to further assess (ETCO2 monitor) and additional trach.  She stated she was going to the room.  Once back in patient's room, the CCU physician was in there as well .  We observed no color change on the ETCO2 adapter so he pulled size 4 out and tried to place 6 and also another size 4 in but neither would pass.  We placed 6 liters [place] on the pt during the procedure.  It was decided that a trach could  not be placed back at this time so I placed gauze with tape over stoma and turned oxygen down to 4lpm [place] patient saturations 98%.  I asked RN to call either wound care or ENT to look at stoma site.  RN placing patient on continuous pulse oxymetry at this time so it can be observed from nurses station.

## 2018-05-23 NOTE — Progress Notes (Signed)
Alexander Alexander just transferred from 2A to ICU 1.  Upon initial assessment of patient, no respiratory distress noted, patient without pain, and VSS.  Patient alert and oriented.  Gauze dressing over stoma, patient on 4LNC.  PICC dressing is regular tegaderm with no catheter securement device and appears to be displaced as evidenced by cm marking and distal catheter length.  Order obtained for stat port CXR to verify PICC placement.

## 2018-05-23 NOTE — Progress Notes (Signed)
PT  Decannulated him self , reinserted with shiley # 4.0 with cuff   Tolerated well , sat 92 %with fio2 40% t/c  no signs resp distress noted , RN  in charge of the pt at bed side

## 2018-05-23 NOTE — Progress Notes (Addendum)
[facility] [place] [facility]   PATIENT NAME: Alexander Alexander    MR#:  [id number]  DATE OF BIRTH:  1971-09-26  SUBJECTIVE:  Was doing better ~up to yesterday, then last night started making some gurgling sound and his colostomy tube came out.  They try to place it in.  The respiratory therapist states that she try to put it in but unable to secure it.  She is not able to suction.  Patient's oxygen sats were dropping into the 80s. She complains of some shortness of breath  REVIEW OF SYSTEMS:    Review of Systems  Constitutional: Negative for chills and fever.  HENT: Negative for congestion and tinnitus.   Eyes: Negative for blurred vision and double vision.  Respiratory: Positive for shortness of breath. Negative for cough and wheezing.   Cardiovascular: Negative for chest pain, orthopnea and PND.  Gastrointestinal: Negative for abdominal pain, diarrhea, nausea and vomiting.  Genitourinary: Negative for dysuria and hematuria.  Neurological: Negative for dizziness, sensory change and focal weakness.  All other systems reviewed and are negative.   Nutrition: Tube feeds  Tolerating Diet: No Tolerating PT: Await Eval.   DRUG ALLERGIES:   Allergies  Allergen Reactions  . Lexapro [Escitalopram Oxalate] Swelling    VITALS:  Blood pressure (!) 141/96, pulse (!) 105, temperature 98.4 F (36.9 C), temperature source Oral, resp. rate 20, height 5\' 11"  (1.803 m), weight (!) 160.6 kg (354 lb 1.6 oz), SpO2 92 %.  PHYSICAL EXAMINATION:   Physical Exam  GENERAL:  47 y.o.-year-old patient lying in bed s/p Trach is in distress EYES: Pupils equal, round, reactive to light. No scleral icterus. Extraocular muscles intact.  HEENT: Head atraumatic, normocephalic. Oropharynx and nasopharynx clear. S/p Trach and on vent.  NG tube in place  NECK:  Supple, no jugular venous distention. No thyroid enlargement, no tenderness.  LUNGS: Decreased breath sounds bilaterally with some  accessory muscle usage CARDIOVASCULAR: S1, S2 RRR, + Metallic valve click. No murmurs, rubs, or gallops.  ABDOMEN: Soft, nontender, nondistended. Bowel sounds present. No organomegaly or mass.  EXTREMITIES: No cyanosis, clubbing or edema b/l.    NEUROLOGIC: Cranial nerves II through XII are intact. No focal Motor or sensory deficits b/l. Globally weak  PSYCHIATRIC: The patient is alert and oriented x 3. SKIN: No obvious rash, lesion, or ulcer.    LABORATORY PANEL:   CBC Recent Labs  Lab 05/23/18 0443  WBC 7.7  HGB 11.7*  HCT 35.5*  PLT 229   ------------------------------------------------------------------------------------------------------------------  Chemistries  Recent Labs  Lab 05/23/18 0443  NA 138  K 3.8  CL 97*  CO2 30  GLUCOSE 125*  BUN 11  CREATININE 2.06*  CALCIUM 9.0   ------------------------------------------------------------------------------------------------------------------  Cardiac Enzymes No results for input(s): TROPONINI in the last 168 hours. ------------------------------------------------------------------------------------------------------------------  RADIOLOGY:  Dg Chest Port 1 View  Result Date: 05/22/2018 CLINICAL DATA:  Respiratory failure. EXAM: PORTABLE CHEST 1 VIEW COMPARISON:  05/11/2018 and 05/06/2018 FINDINGS: Tracheostomy tube appears in good position. PICC tip is at the level of the carina in the superior vena cava. Chronic cardiomegaly. Pulmonary vascularity is normal. Slight bibasilar atelectasis, slightly improved on the right and slightly increased on the left. IMPRESSION: Slight bibasilar atelectasis, improved on the right and slightly increased on the left. Electronically Signed   By: Francene BoyersJames  Maxwell M.D.   On: 05/22/2018 07:36     ASSESSMENT AND PLAN:   47 year old male with past medical history significant for A. fib status post ablation,  aortic valve replacement on warfarin, history of congestive heart failure, CKD,  morbid obesity, diabetes and sleep apnea who was getting an outpatient MRI of his lower back had a cardiac arrest.  * Acute cardiopulmonary arrest  - Pulseless VT -Incentive spirometry -Passy-Muir valve trial yesterday -Now having trouble with his trach.  I discussed with Dr. Clelia Croft of the ICU who directed me to Dr. Linward Natal I discussed the case with Dr. Linward Natal.  One of the two physicians will be evaluating this patient for the further input.  Patient with some respiratory distress.   * AcuteMRSA/VAP Treated withcourse ofLinezolid - afebrile/hemodynamically stable.   *Atrial fibrillationwith RVR - rate controlled.  - Continuemetoprolol,Cardizem.  - cont. Heparin gtt given pt. Hx of Mechanical aortic valve placement.   * History of mechanical aortic valve replacement - cont. Heparin gtt.   * Acute toxic metabolic encephalopathy  - Neurology input appreciated  - initially Started on Kepprafor possible partial seizures. Later stopped. - mental status back to baseline, no acute seizures.   * Chronic diabetes mellitus type 2 - cont. SSI. BS stable.   * Acute renal failurew/ CKD stage III Secondary toATN, Cr. Close to baseline now.   * N/V -resolved.  Possibly due to constipation.   Improved  * nutrition -had  Dobbhoff tube placement then came out, swallowing eval's patients hopefully will be able to eat soon  All the records are reviewed and case discussed with Care Management/Social Worker. Management plans discussed with the patient, family and they are in agreement.  CODE STATUS: full code  DVT Prophylaxis: Heparin gtt  TOTAL TIME TAKING CARE OF THIS PATIENT: 35 minutes critical care time spent   POSSIBLE D/C unclear, DEPENDING ON CLINICAL CONDITION and progress.    [name] M.D on 05/23/2018 at 8:29 AM  Between 7am to 6pm - Pager - [phone]  After 6pm go to [email or web address] - Social research officer, government  [facility] [place] [facility]   Office  [phone]  CC: Primary care physician; Erasmo Downer, MD

## 2018-05-23 NOTE — Progress Notes (Signed)
[facility]    PT PROFILE: [phone] with PMH of atrial fibrillation, and aortic valve replacement on long term warfarin, CHF, CKD, diabetes, obstructive sleep apnea and hypertension, suffered a pulseless VT cardiac arrest in MRI when he was being evaluated for back pain and sciatica, subsequently intubated in the intensive care unit. Difficult airway noted    Assessment and Plan:  Discussed with Dr. Allena Katz, Dr. Sherryll Burger as well as with  Dr. Sibyl Parr, ENT, who evaluated the patient bedside.  The stoma appears to have closed, patient now appears to be doing better, currently on 2 L nasal cannula. Recommend that the patient continue to be monitored closely, if patient desats then the patient should be orally reintubated.  Recannulation via the trach stoma should not be attempted unless by a tracheostomy procedure in the OR.  -- AcuteMRSA/VAP Treated withcourse ofLinezolid - afebrile/hemodynamically stable.   Acute hypoxic respiratory failure. -Continue nasal cannula oxygen.   Obstructive sleep apnea. - Patient was previously on CPAP, noncompliant. - Will require reevaluation once trach stoma healed.    Date: 05/23/2018  MRN# [id number] Alexander Alexander 06/07/1971   Alexander Alexander is a 47 y.o. old male seen in follow up for chief complaint of  Chief Complaint  Patient presents with  . Back Pain    Subjective:  I was called urgently to the bedside, patient apparently had decannulated himself overnight.  The site was recannulated with a size 4 tracheostomy.  However this was again pulled out today earlier this morning.  Substernally the patient had some desaturation episodes while on the floor. On evaluation evaluation at the bedside the patient currently appears to have air passing through the trachea, oxygen saturations is 100% on 6 L nasal cannula.  Good bilateral air entry. Discussed with Dr. Clelia Croft at bedside, we attempted to replace the cannula however it could  not be passed, and patient did not appear to be breathing through it, suggesting that this may be a false stoma.   EVENTS/RESULTS: 5/31   MRI/Cardiac arrest>>ROSC 05/31 CT head: NAD 05/31 Echocardiogram: LVEF 55-60%. No vegetations 06/02 CT head: NAD 06/11 CT chest: dense RLL consolidation 06/21 LE venous US: Positive exam for left calf peroneal DVT without propagation into the femoral or popliteal veins. Very low thrombus burden. 07/14 tracheostomy tube changed to #6 Shiley, cuffed 07/16 Pt self-cannulated with desats, transferred back to OR.   INDWELLING DEVICES:: R IJ CVL 05/31 >> 06/10 ETT 05/31 >> 06/18 RUE PICC 06/10 >> Trach tube 06/18 >>  MICRO DATA: MRSA PCR 05/31 >> NEG Urine 06/01 >> NEG Resp 06/01 >>MRSA Blood 06/01 >> NEG C diff 06/04 >>NEG  Urine 06/05 >> NEG Resp 06/05 >> MRSA Blood 06/05 >> NEG Resp 06/11 >>MRSA C diff 6/17 >> NEG Resp 06/20 >> MRSA Blood 06/20 >> NEG MRSA PCR 07/07 >> POS  ANTIMICROBIALS:  Pip-tazo 05/31 >> 06/03 Vanc 05/31 >> 6/12 Cefepime 06/11 >> 06/15 Linezolid 06/12 >>06/21   Medication:    Current Facility-Administered Medications:  .  acetaminophen (TYLENOL) suppository 650 mg, 650 mg, Rectal, Q4H PRN, [name], MD .  acetaminophen (TYLENOL) tablet 650 mg, 650 mg, Per Tube, Q6H PRN, Salary, Montell D, MD .  chlorhexidine (PERIDEX) 0.12 % solution 15 mL, 15 mL, Mouth Rinse, BID, Conforti, John, DO, 15 mL at 05/22/18 1030 .  dextrose 5 % and 0.45 % NaCl with KCl 20 mEq/L infusion, , Intravenous, Continuous, Merwyn Katos, MD, Last Rate: 50 mL/hr at 05/23/18 0112 .  diltiazem (CARDIZEM) 100 mg in dextrose 5% (1 mg/mL) infusion, 5-15 mg/hr, Intravenous, Titrated, Merwyn Katos, MD, Last Rate: 10 mL/hr at 05/23/18 0607, 10 mg/hr at 05/23/18 0607 .  fentaNYL (DURAGESIC - dosed mcg/hr) 50 mcg, 50 mcg, Transdermal, Q72H, Merwyn Katos, MD, 50 mcg at 05/21/18 0805 .  fentaNYL (SUBLIMAZE) injection 25  mcg, 25 mcg, Intravenous, Q2H PRN, Merwyn Katos, MD, 25 mcg at 05/22/18 0120 .  heparin ADULT infusion 100 units/mL (25000 units/212mL sodium chloride 0.45%), 2,050 Units/hr, Intravenous, Continuous, [name], MD, Last Rate: 20.5 mL/hr at 05/23/18 0621, 2,050 Units/hr at 05/23/18 0621 .  hydrALAZINE (APRESOLINE) injection 10 mg, 10 mg, Intravenous, Q2H PRN, Conforti, John, DO, 10 mg at 05/11/18 0110 .  insulin aspart (novoLOG) injection 0-20 Units, 0-20 Units, Subcutaneous, Q4H, Merwyn Katos, MD, 3 Units at 05/23/18 [phone] .  ipratropium-albuterol (DUONEB) 0.5-2.5 (3) MG/3ML nebulizer solution 3 mL, 3 mL, Nebulization, Q8H, Merwyn Katos, MD, 3 mL at 05/23/18 0759 .  LORazepam (ATIVAN) injection 0.5-1 mg, 0.5-1 mg, Intravenous, Q4H PRN, Merwyn Katos, MD, 1 mg at 05/23/18 0411 .  MEDLINE mouth rinse, 15 mL, Mouth Rinse, q12n4p, Tukov-Yual, Magdalene S, NP, 15 mL at 05/22/18 1400 .  metoprolol tartrate (LOPRESSOR) injection 2.5-5 mg, 2.5-5 mg, Intravenous, Q3H PRN, Merwyn Katos, MD .  morphine 2 MG/ML injection 1 mg, 1 mg, Intravenous, Q4H PRN, [name], MD, 1 mg at 05/23/18 0446 .  ondansetron (ZOFRAN) injection 4 mg, 4 mg, Intravenous, Q6H PRN, Conforti, John, DO, 4 mg at 05/23/18 0445 .  senna-docusate (Senokot-S) tablet 2 tablet, 2 tablet, Per Tube, BID, Houston Siren, MD, 2 tablet at 05/19/18 0926 .  sodium chloride flush (NS) 0.9 % injection 10-40 mL, 10-40 mL, Intracatheter, Q12H, Pyreddy, Pavan, MD, 10 mL at 05/21/18 2026 .  sodium chloride flush (NS) 0.9 % injection 10-40 mL, 10-40 mL, Intracatheter, PRN, Pyreddy, Pavan, MD .  warfarin (COUMADIN) tablet 5 mg, 5 mg, Per Tube, q1800, [name], MD .  Warfarin - Pharmacist Dosing Inpatient, , Does not apply, q1800, [name], MD   Allergies:  Lexapro [escitalopram oxalate]   Review of Systems:  Constitutional: no distress Cardiovascular: No chest pain.  Pulmonary: Denies dyspnea.   The  remainder of systems were reviewed and were found to be negative other than what is documented in the HPI.   Physical Examination:   VS: BP (!) 149/113   Pulse 86   Temp 98.4 F (36.9 C) (Oral)   Resp 20   Ht 5\' 11"  (1.803 m)   Wt (!) 354 lb 1.6 oz (160.6 kg)   SpO2 98%   BMI 49.39 kg/m   General Appearance: No distress, obese.  Neuro:without focal findings, mental status normal. HEENT: PERRLA, trach site decannulated, stoma appears bloody.  Pulmonary: No wheezing, No rales  CardiovascularNormal S1,S2.  No m/r/g.  Abdomen: Benign, Soft, non-tender, No masses Renal:  No costovertebral tenderness  GU:  No performed at this time. Endoc: No evident thyromegaly, no signs of acromegaly or Cushing features Skin:   warm, no rashes, no ecchymosis  Extremities: normal, no cyanosis, clubbing.      LABORATORY PANEL:   CBC Recent Labs  Lab 05/23/18 0443  WBC 7.7  HGB 11.7*  HCT 35.5*  PLT 229   ------------------------------------------------------------------------------------------------------------------  Chemistries  Recent Labs  Lab 05/23/18 0443  NA 138  K 3.8  CL 97*  CO2 30  GLUCOSE 125*  BUN 11  CREATININE 2.06*  CALCIUM 9.0   ------------------------------------------------------------------------------------------------------------------  Cardiac Enzymes No results for input(s): TROPONINI in the last 168 hours. ------------------------------------------------------------  RADIOLOGY:   No results found for this or any previous visit. Results for orders placed during the hospital encounter of 12/06/17  DG Chest 2 View   Narrative CLINICAL DATA:  Shortness of breath.  Hypoxia.  EXAM: CHEST  2 VIEW  COMPARISON:  11/16/2017.  FINDINGS: Prior median sternotomy and cardiac valve replacement. Cardiomegaly with pulmonary vascular prominence. Similar findings on prior exam. Slight improvement in left base atelectasis. Stable elevation  left hemidiaphragm. No pleural effusion or pneumothorax. Degenerative changes thoracic spine.  IMPRESSION: 1. Prior median sternotomy and cardiac valve replacement. Stable cardiomegaly and pulmonary venous congestion. No overt pulmonary edema.  2. Mild improvement of left base atelectasis. Persistent elevation left hemidiaphragm.   Electronically Signed   By: Maisie Fushomas  Register   On: 12/06/2017 10:16    ------------------------------------------------------------------------------------------------------------------  Thank  you for allowing [facility] to assist in the care of your patient. Our recommendations are noted above.  Please contact us if we can be of further service.   Wells Guileseep Laurelyn Terrero, M.D., F.C.C.P.  Board Certified in Internal Medicine, Pulmonary Medicine, Critical Care Medicine, and Sleep Medicine.  [facility] Office Number: [phone]  05/23/2018

## 2018-05-23 NOTE — Progress Notes (Signed)
SLP Cancellation Note  Patient Details Name: Pricilla RiffleOtis Leon Ganas MRN: [id number] DOB: October 05, 1971   Cancelled treatment:       Reason Eval/Treat Not Completed: Patient not medically ready(trach came out during the night; ENT addressing). Per RT, overnight pt decannulated himself and they were unable to successfully reinsert another cannula. ENT to address need to reinsert trach; transfer to CCU pending.  ST services will f/u tomorrow w/ pt's status and potential BSE when appropriate. Wife/pt agreed. NSG agreed.     Jerilynn SomKatherine Modesta Sammons, MS, CCC-SLP Marylin Lathon 05/23/2018, 11:01 AM

## 2018-05-23 NOTE — Progress Notes (Signed)
After removal of foley complains of burning in urine-- will check ua Picc is still an issue they placed on other arm but now complains of pain on right arm <where old picc was> Will get DVT upper ext study  Alexander Alexander [facility]

## 2018-05-23 NOTE — Progress Notes (Signed)
Portable CXR resulted that PICC had been withdrawn.  Orders obtained from Dr Sherryll BurgerShah for IV team consult for PICC exchange.

## 2018-05-23 NOTE — Progress Notes (Signed)
Patient was seen by PCCM attending in am Patient with long standing hospital stay, S/p respiratory failure and trach. Seems like issues with trach overnight and size 4 tube was not on correct position, I was present and not able to pass trach back. Patient did well on Iatan. ENT evaluated suggested monitor off trach. Will monitor in stepdown with plan for BIPAP at night. If any issues will consider oral intubation.  Picc line position noted, IV time is asked to adjust line Will get CXR and other labs in am  Metzli Pollick [facility]

## 2018-05-23 NOTE — Progress Notes (Signed)
PT Hold Note  Patient Details Name: Alexander RiffleOtis Leon Alexander MRN: [id number] DOB: Jan 23, 1971   Hold Treatment:    Reason Eval/Treat Not Completed: Medical issues which prohibited therapy. Chart reviewed. Per RT overnight pt decannulated himself and they were unable to successfully reinsert another cannula. Pt currently on continuous pulse oximetry and awaiting ENT or wound care to look at stoma site. Pt currently not appropriate for PT treatment until respiratory status has stabilized. Will continue to follow and see at later time/date as pt is appropriate.  Sharalyn InkJason D Aira Sallade PT, DPT, GCS  Aleesha Ringstad 05/23/2018, 9:18 AM

## 2018-05-24 ENCOUNTER — Inpatient Hospital Stay: Payer: Medicare HMO

## 2018-05-24 LAB — CBC WITH DIFFERENTIAL/PLATELET
BASOS ABS: 0 10*3/uL (ref 0–0.1)
Basophils Relative: 0 %
EOS ABS: 0.2 10*3/uL (ref 0–0.7)
Eosinophils Relative: 2 %
HCT: 34.2 % — ABNORMAL LOW (ref 40.0–52.0)
Hemoglobin: 11.4 g/dL — ABNORMAL LOW (ref 13.0–18.0)
LYMPHS ABS: 2.1 10*3/uL (ref 1.0–3.6)
Lymphocytes Relative: 33 %
MCH: 27.2 pg (ref 26.0–34.0)
MCHC: 33.3 g/dL (ref 32.0–36.0)
MCV: 81.8 fL (ref 80.0–100.0)
Monocytes Absolute: 1.3 10*3/uL — ABNORMAL HIGH (ref 0.2–1.0)
Monocytes Relative: 21 %
Neutro Abs: 2.8 10*3/uL (ref 1.4–6.5)
Neutrophils Relative %: 44 %
Platelets: 224 10*3/uL (ref 150–440)
RBC: 4.18 MIL/uL — AB (ref 4.40–5.90)
RDW: 18 % — ABNORMAL HIGH (ref 11.5–14.5)
WBC: 6.3 10*3/uL (ref 3.8–10.6)

## 2018-05-24 LAB — GLUCOSE, CAPILLARY
GLUCOSE-CAPILLARY: 133 mg/dL — AB (ref 70–99)
GLUCOSE-CAPILLARY: 143 mg/dL — AB (ref 70–99)
GLUCOSE-CAPILLARY: 117 mg/dL — AB (ref 70–99)
Glucose-Capillary: 112 mg/dL — ABNORMAL HIGH (ref 70–99)
Glucose-Capillary: 115 mg/dL — ABNORMAL HIGH (ref 70–99)
Glucose-Capillary: 129 mg/dL — ABNORMAL HIGH (ref 70–99)
Glucose-Capillary: 130 mg/dL — ABNORMAL HIGH (ref 70–99)

## 2018-05-24 LAB — HEPARIN LEVEL (UNFRACTIONATED): HEPARIN UNFRACTIONATED: 0.62 [IU]/mL (ref 0.30–0.70)

## 2018-05-24 LAB — BASIC METABOLIC PANEL
ANION GAP: 9 (ref 5–15)
BUN: 8 mg/dL (ref 6–20)
CO2: 31 mmol/L (ref 22–32)
Calcium: 8.6 mg/dL — ABNORMAL LOW (ref 8.9–10.3)
Chloride: 96 mmol/L — ABNORMAL LOW (ref 98–111)
Creatinine, Ser: 2.01 mg/dL — ABNORMAL HIGH (ref 0.61–1.24)
GFR calc non Af Amer: 37 mL/min — ABNORMAL LOW (ref >60)
GFR, EST AFRICAN AMERICAN: 43 mL/min — AB (ref >60)
Glucose, Bld: 129 mg/dL — ABNORMAL HIGH (ref 70–99)
POTASSIUM: 3.7 mmol/L (ref 3.5–5.1)
SODIUM: 136 mmol/L (ref 135–145)

## 2018-05-24 LAB — MAGNESIUM: MAGNESIUM: 1.5 mg/dL — AB (ref 1.7–2.4)

## 2018-05-24 MED ORDER — NEPRO/CARBSTEADY PO LIQD
237.0000 mL | Freq: Three times a day (TID) | ORAL | Status: DC
  Administered 2018-05-24: 237 mL via ORAL

## 2018-05-24 MED ORDER — MAGNESIUM SULFATE 4 GM/100ML IV SOLN
4.0000 g | Freq: Once | INTRAVENOUS | Status: AC
  Administered 2018-05-24: 4 g via INTRAVENOUS
  Filled 2018-05-24: qty 100

## 2018-05-24 MED ORDER — WARFARIN SODIUM 5 MG PO TABS
5.0000 mg | ORAL_TABLET | Freq: Every day | ORAL | Status: DC
  Administered 2018-05-24: 5 mg via ORAL
  Filled 2018-05-24 (×2): qty 1

## 2018-05-24 MED ORDER — SODIUM CHLORIDE 0.9 % IV SOLN: 4.0000 g | Freq: Once | INTRAVENOUS | Status: DC

## 2018-05-24 MED ORDER — FENTANYL CITRATE (PF) 100 MCG/2ML IJ SOLN
12.5000 ug | INTRAMUSCULAR | Status: AC
  Administered 2018-05-24: 12.5 ug via INTRAVENOUS
  Filled 2018-05-24: qty 2

## 2018-05-24 MED ORDER — PROMETHAZINE HCL 25 MG/ML IJ SOLN
6.2500 mg | INTRAMUSCULAR | Status: AC
  Administered 2018-05-24: 6.25 mg via INTRAVENOUS
  Filled 2018-05-24: qty 1

## 2018-05-24 MED ORDER — DILTIAZEM HCL 30 MG PO TABS
60.0000 mg | ORAL_TABLET | Freq: Four times a day (QID) | ORAL | Status: DC
  Administered 2018-05-24 – 2018-05-26 (×8): 60 mg via ORAL
  Filled 2018-05-24: qty 1
  Filled 2018-05-24 (×7): qty 2

## 2018-05-24 NOTE — Progress Notes (Signed)
Nutrition Follow-up  DOCUMENTATION CODES:   Morbid obesity  INTERVENTION:  Provide Nepro Shake po TID, each supplement provides 425 kcal and 19 grams protein.  Discussed patient's increased needs for calories and protein. Discussed choosing bland foods that are calorie- and protein-dense to help meet these needs. Encouraged patient to drink Nepro between meals.  NUTRITION DIAGNOSIS:   Inadequate oral intake related to inability to eat as evidenced by NPO status.  Improving - diet has been advanced today. Inadequate intake continues but addressing with education and ONS.  GOAL:   Patient will meet greater than or equal to 90% of their needs  Progressing.  MONITOR:   Diet advancement, Labs, Weight trends, Skin, I & O's  REASON FOR ASSESSMENT:   Ventilator, Consult Enteral/tube feeding initiation and management  ASSESSMENT:   47 year old male with PMHx of A-fib s/p ablation and mechanical aortic valve replacement on warfarin, HTN, DM type 2, CHF, OSA, anxiety, CKD, HLD, pancreatitis who presented with lower back pain and sciatica. While in MRI on 5/31 suite patient suffered pulseless V. tach cardiac arrest with 5 minutes ACLS before ROSC obtained, and was intubated.   -Patient s/p placement of tracheostomy tube on 6/18. OGT was removed that day and replaced with an NGT. -PEG tube was unable to be placed on 6/21 due to inability to find good one-to-one indentation and transillumination for safe placement. -Rectal tube was removed on 6/22. -IR attempted to place G-tube on 6/27. Tube was unable to be placed as a safe percutaneous window could not be identified. -Per chart patient is not a good candidate for surgically placed G-tube. -Patient's Dobbhoff tube was dislodged and replaced several times. On 7/12 the tube became clogged and it was removed. Patient refused to have it replaced after that. -Patient decannulated himself on 7/16. The stoma has closed so recannulation was not  possible. Patient is tolerating nasal cannula. -Following SLP evaluation on 7/17 patient was put on dysphagia 3 (mechanical soft) diet with thin liquids.  Met with patient and wife at bedside. Patient reports he is feeling well today and excited to start eating again. For breakfast he ordered an omelet with cheese and hash browns. He reports he may have "overdid" it with the cheese, so he stopped eating after a few bites so he would not develop any abdominal pain. He denies any post-prandial nausea or abdominal pain and also denies any issues with chewing/swallowing. Discussed taking eating slow and starting with more bland foods. Discussed patient's very high needs for calories and protein. Patient is amenable to drinking an oral nutrition supplement between meals. Wife is very concerned about potassium getting too high again so she requests it be a low potassium ONS.  Medications reviewed and include: Xanax, fentanyl patch, Novolog 0-20 units Q4hrs, senna-docusate, warfarin, D5-1/2NS with KCl 20 mEq/L at 50 mL/hr (60 grams dextrose, 204 kcal daily), heparin gtt.  Labs reviewed: CBG 112-133 past 24 hrs, Chloride 96, Magnesium 1.5. Potassium 3.7.  I/O: 1405 mL UOP yesterday (0.4 mL/kg/hr)  Weight trend: 157.1 kg on 7/17; wt continues to trend down and patient is now -10.7 kg from admission wt  Discussed with SLP. Discussed with RN and on rounds.  Diet Order:   Diet Order           DIET DYS 3 Room service appropriate? Yes with Assist; Fluid consistency: Thin  Diet effective now          EDUCATION NEEDS:   No education needs have been identified  at this time  Skin:  Skin Assessment: Reviewed RN Assessment Skin Integrity Issues:: Incisions Stage II: N/A Incisions: N/A  Last BM:  05/22/2018 - large type 6  Height:   Ht Readings from Last 1 Encounters:  05/21/18 _0  (1.803 m)    Weight:   Wt Readings from Last 1 Encounters:  05/24/18 (!) 346 lb 5.5 oz (157.1 kg)    Ideal  Body Weight:  75.5 kg  BMI:  Body mass index is 48.3 kg/m.  Estimated Nutritional Needs:   Kcal:  2585-2778 (MSJ x 1.1-1.2)  Protein:  140-170 grams (1.85-2.25 grams/kg IBW; 0.8-1 grams/kg admission wt)  Fluid:  1.8-2.3 L/day (25-30 mL/kg IBW)  Willey Blade, MS, RD, LDN Office: [phone] Pager: [phone] After Hours/Weekend Pager: [phone]

## 2018-05-24 NOTE — Progress Notes (Signed)
Stoma care completed, site cleaned with NS and clean dry pressure dressing applied.

## 2018-05-24 NOTE — Progress Notes (Signed)
[employer] [facility] [place] [facility]   PATIENT NAME: Alexander Alexander    MR#:  [id number]  DATE OF BIRTH:  [birth year]  SUBJECTIVE:   Pt doing better able to eat this morning on 2 L of oxygen   REVIEW OF SYSTEMS:    Review of Systems  Constitutional: Negative for chills and fever.  HENT: Negative for congestion and tinnitus.   Eyes: Negative for blurred vision and double vision.  Respiratory: Positive for shortness of breath. Negative for cough and wheezing.   Cardiovascular: Negative for chest pain, orthopnea and PND.  Gastrointestinal: Negative for abdominal pain, diarrhea, nausea and vomiting.  Genitourinary: Negative for dysuria and hematuria.  Neurological: Negative for dizziness, sensory change and focal weakness.  All other systems reviewed and are negative.   Nutrition: Tube feeds  Tolerating Diet: No Tolerating PT: Await Eval.   DRUG ALLERGIES:   Allergies  Allergen Reactions  . Lexapro [Escitalopram Oxalate] Swelling    VITALS:  Blood pressure (!) 143/99, pulse (!) 101, temperature 98 F (36.7 C), temperature source Oral, resp. rate 19, height 5\' 11"  (1.803 m), weight (!) 157.1 kg (346 lb 5.5 oz), SpO2 92 %.  PHYSICAL EXAMINATION:   Physical Exam  GENERAL:  47 y.o.-year-old patient lying in bed s/p Trach is in distress EYES: Pupils equal, round, reactive to light. No scleral icterus. Extraocular muscles intact.  HEENT: Head atraumatic, normocephalic. Oropharynx and nasopharynx clear. S/p Trach and on vent.  NG tube in place  NECK:  Supple, no jugular venous distention. No thyroid enlargement, no tenderness.  LUNGS: Decreased breath sounds bilaterally with some accessory muscle usage CARDIOVASCULAR: S1, S2 RRR, + Metallic valve click. No murmurs, rubs, or gallops.  ABDOMEN: Soft, nontender, nondistended. Bowel sounds present. No organomegaly or mass.  EXTREMITIES: No cyanosis, clubbing or edema b/l.    NEUROLOGIC: Cranial nerves II through XII  are intact. No focal Motor or sensory deficits b/l. Globally weak  PSYCHIATRIC: The patient is alert and oriented x 3. SKIN: No obvious rash, lesion, or ulcer.    LABORATORY PANEL:   CBC Recent Labs  Lab 05/24/18 0334  WBC 6.3  HGB 11.4*  HCT 34.2*  PLT 224   ------------------------------------------------------------------------------------------------------------------  Chemistries  Recent Labs  Lab 05/24/18 0334  NA 136  K 3.7  CL 96*  CO2 31  GLUCOSE 129*  BUN 8  CREATININE 2.01*  CALCIUM 8.6*  MG 1.5*   ------------------------------------------------------------------------------------------------------------------  Cardiac Enzymes No results for input(s): TROPONINI in the last 168 hours. ------------------------------------------------------------------------------------------------------------------  RADIOLOGY:  Dg Chest 1 View  Result Date: 05/24/2018 CLINICAL DATA:  Shortness of breath EXAM: CHEST  1 VIEW COMPARISON:  05/23/2018 FINDINGS: Interval removal of right PICC line. Left PICC line tip in the SVC. Cardiomegaly. Prior median sternotomy and valve replacement. Vascular congestion and bibasilar atelectasis. IMPRESSION: Increasing vascular congestion and bibasilar atelectasis. Electronically Signed   By: Charlett Nose M.D.   On: 05/24/2018 10:11   Dg Shoulder 1v Right  Result Date: 05/24/2018 CLINICAL DATA:  Right shoulder pain. EXAM: RIGHT SHOULDER - 1 VIEW COMPARISON:  None. FINDINGS: There is no evidence of fracture or dislocation. There is no evidence of arthropathy or other focal bone abnormality. Soft tissues are unremarkable. IMPRESSION: No acute abnormality on this single view of the right shoulder. Electronically Signed   By: Marin Roberts M.D.   On: 05/24/2018 12:10   Dg Chest Port 1 View  Result Date: 05/23/2018 CLINICAL DATA:  PICC placement EXAM: PORTABLE CHEST 1  VIEW COMPARISON:  05/23/2018 FINDINGS: Right arm PICC tip in the right  axillary vein, unchanged from the prior study. New left arm PICC tip in the proximal SVC. Cardiac enlargement and CABG. Negative for heart failure. Left lower lobe atelectasis. IMPRESSION: Left arm PICC tip in the SVC No change in right arm PICC tip in the right axillary vein Left lower lobe atelectasis. Electronically Signed   By: Marlan Palauharles  Clark M.D.   On: 05/23/2018 18:24   Dg Chest Port 1 View  Result Date: 05/23/2018 CLINICAL DATA:  Displacement of peripherally inserted central catheter EXAM: PORTABLE CHEST 1 VIEW COMPARISON:  Portable exam 1151 hours compared to 05/22/2018 FINDINGS: RIGHT arm PICC line has been partially withdrawn, tip now in RIGHT subclavian vein. Enlargement of cardiac silhouette post median sternotomy and at AVR. Slight pulmonary vascular congestion. Subsegmental atelectasis LEFT base, with minimal atelectasis dependently at RIGHT base. Upper lungs clear. No pleural effusion or pneumothorax. IMPRESSION: Tip of RIGHT arm PICC line projects over the RIGHT subclavian vein, partially withdrawn since previous exam. Enlargement of cardiac silhouette with pulmonary vascular congestion post AVR. Bibasilar atelectasis greater on LEFT. Electronically Signed   By: Ulyses SouthwardMark  Boles M.D.   On: 05/23/2018 12:42   [place] Ekg Site Rite  Result Date: 05/23/2018 If Site Rite image not attached, placement could not be confirmed due to current cardiac rhythm.    ASSESSMENT AND PLAN:   47 year old male with past medical history significant for A. fib status post ablation, aortic valve replacement on warfarin, history of congestive heart failure, CKD, morbid obesity, diabetes and sleep apnea who was getting an outpatient MRI of his lower back had a cardiac arrest.  * Acute cardiopulmonary arrest  - Pulseless VT -Incentive spirometry -Patient doing better now from yesterday plan to transfer him to the floor   * AcuteMRSA/VAP Treated withcourse ofLinezolid - afebrile/hemodynamically stable.    *Atrial fibrillationwith RVR - rate controlled.  - Continuemetoprolol,Cardizem.  - cont. Heparin gtt given pt. Hx of Mechanical aortic valve placement.   * History of mechanical aortic valve replacement - cont. Heparin gtt. convert to Coumadin if able to take orally  * Acute toxic metabolic encephalopathy  - Neurology input appreciated  - initially Started on Kepprafor possible partial seizures. Later stopped. - mental status back to baseline, no acute seizures.   * Chronic diabetes mellitus type 2 - cont. SSI. BS stable.   * Acute renal failurew/ CKD stage III Secondary toATN, Cr. Close to baseline now.   * N/V -resolved.  Possibly due to constipation.   Improved  * nutrition -patient able to eat starting today All the records are reviewed and case discussed with Care Management/Social Worker. Management plans discussed with the patient, family and they are in agreement.  CODE STATUS: full code  DVT Prophylaxis: Heparin gtt  TOTAL TIME TAKING CARE OF THIS PATIENT: 35 minutes critical care time spent   POSSIBLE D/C unclear, DEPENDING ON CLINICAL CONDITION and progress.    Auburn BilberryShreyang Orlanda Lemmerman M.D on 05/24/2018 at 2:17 PM  Between 7am to 6pm - Pager - [phone]  After 6pm go to [email or web address] - Social research officer, governmentpassword [email or web address] [email or web address]  [facility] [place] [facility]  Office  [phone]  CC: Primary care physician; Erasmo DownerBacigalupo, Angela M, MD

## 2018-05-24 NOTE — Progress Notes (Addendum)
Pt was concern about infection. Would like to get lactic acid drawn. CBC was already order in the morning. Notify prime. Will continue to monitor.  Update 2358: Doctor Caryn BeeMaier ordered to check lactic acid once. Will continue to monitor.

## 2018-05-24 NOTE — Progress Notes (Addendum)
[facility]    PT PROFILE: [id number] with PMH of atrial fibrillation, and aortic valve replacement on long term warfarin, CHF, CKD, diabetes, obstructive sleep apnea and hypertension, suffered a pulseless VT cardiac arrest in MRI when he was being evaluated for back pain and sciatica, subsequently intubated in the intensive care unit. Difficult airway noted    Assessment and Plan:  Discussed with nursing and ENT, The stoma appears to have closed, patient now appears to be doing better, currently on 2 L nasal cannula. Did well overnight with bipap use Recommend that the patient continue to be monitored closely, if patient desats then the patient should be orally reintubated.  Recannulation via the trach stoma should not be attempted unless by a tracheostomy procedure in the OR. Patient wish to use home CPAP and ok to use  -- Speech to follow up for diet -- Refused ABG in am -- Ok to use home CPAP/BIPAP -- Follow up on DVT study and speech consult -- Ok to transfer out of ICU with CPAP/BIPAP at night -- Patient is on home oxygen, continue 3 liter -- OT and PT to follow up -- On cardizem drip and heparin drip -- for Afib and Mechanical valve, can be converted to po once able to take po -- Stop iv fluids if able to take po   -- AcuteMRSA/VAP Treated withcourse ofLinezolid - afebrile/hemodynamically stable.   Acute hypoxic respiratory failure. -Continue nasal cannula oxygen.   Obstructive sleep apnea. - Patient was previously on CPAP,  - Will require reevaluation once trach stoma healed as out patient   GI DVT prophylaxis Pulmonary to follow on floor ICU team will be available as needed    Date: 05/24/2018  MRN# [id number] Alexander Alexander [birth year]   Alexander Alexander is a 47 y.o. old male seen in follow up for chief complaint of  Chief Complaint  Patient presents with  . Back Pain    Subjective:  I was called urgently to the bedside, patient  apparently had decannulated himself overnight.  The site was recannulated with a size 4 tracheostomy.  However this was again pulled out today earlier this morning.  Substernally the patient had some desaturation episodes while on the floor. On evaluation evaluation at the bedside the patient currently appears to have air passing through the trachea, oxygen saturations is 100% on 6 L nasal cannula.  Good bilateral air entry. Discussed with Dr. Clelia Croft at bedside, we attempted to replace the cannula however it could not be passed, and patient did not appear to be breathing through it, suggesting that this may be a false stoma.   EVENTS/RESULTS: 5/31   MRI/Cardiac arrest>>ROSC 05/31 CT head: NAD 05/31 Echocardiogram: LVEF 55-60%. No vegetations 06/02 CT head: NAD 06/11 CT chest: dense RLL consolidation 06/21 LE venous US: Positive exam for left calf peroneal DVT without propagation into the femoral or popliteal veins. Very low thrombus burden. 07/14 tracheostomy tube changed to #6 [place], cuffed 07/16 Pt self-cannulated with desats, transferred back to ICU for observation 7/17 overnight did well, No major issues, ENT evlauated suggested continue to monitor on [place] and CPAP/BIPAP at night if in trouble consider intubation through oral route -- Overall did well -- Some issues with PICC -- left side removed-- right side some pain -- DVT study is ordered and pending   INDWELLING DEVICES:: R IJ CVL 05/31 >> 06/10 ETT 05/31 >> 06/18 RUE PICC 06/10 >> Trach tube 06/18 >>  MICRO DATA: MRSA PCR  05/31 >> NEG Urine 06/01 >> NEG Resp 06/01 >>MRSA Blood 06/01 >> NEG C diff 06/04 >>NEG  Urine 06/05 >> NEG Resp 06/05 >> MRSA Blood 06/05 >> NEG Resp 06/11 >>MRSA C diff 6/17 >> NEG Resp 06/20 >> MRSA Blood 06/20 >> NEG MRSA PCR 07/07 >> POS  ANTIMICROBIALS:  Pip-tazo 05/31 >> 06/03 Vanc 05/31 >> 6/12 Cefepime 06/11 >> 06/15 Linezolid 06/12 >>06/21   Medication:    Current  Facility-Administered Medications:  .  acetaminophen (TYLENOL) suppository 650 mg, 650 mg, Rectal, Q4H PRN, [place] BilberryPatel, Shreyang, MD .  acetaminophen (TYLENOL) tablet 650 mg, 650 mg, Per Tube, Q6H PRN, Salary, Montell D, MD .  ALPRAZolam Prudy Feeler(XANAX) tablet 0.25 mg, 0.25 mg, Oral, Once, Sherryll BurgerShah, Letrice Pollok, MD .  chlorhexidine (PERIDEX) 0.12 % solution 15 mL, 15 mL, Mouth Rinse, BID, Conforti, John, DO, 15 mL at 05/23/18 2147 .  dextrose 5 % and 0.45 % NaCl with KCl 20 mEq/L infusion, , Intravenous, Continuous, Merwyn KatosSimonds, David B, MD, Last Rate: 50 mL/hr at 05/23/18 1949 .  diltiazem (CARDIZEM) 100 mg in dextrose 5% 100mL (1 mg/mL) infusion, 5-15 mg/hr, Intravenous, Titrated, Merwyn KatosSimonds, David B, MD, Last Rate: 10 mL/hr at 05/24/18 0111, 10 mg/hr at 05/24/18 0111 .  fentaNYL (DURAGESIC - dosed mcg/hr) 50 mcg, 50 mcg, Transdermal, Q72H, Merwyn KatosSimonds, David B, MD, 50 mcg at 05/21/18 0805 .  fentaNYL (SUBLIMAZE) injection 25 mcg, 25 mcg, Intravenous, Q2H PRN, Merwyn KatosSimonds, David B, MD, 25 mcg at 05/24/18 0204 .  heparin ADULT infusion 100 units/mL (25000 units/27150mL sodium chloride 0.45%), 2,050 Units/hr, Intravenous, Continuous, [place] BilberryPatel, Shreyang, MD, Last Rate: 20.5 mL/hr at 05/24/18 0650, 2,050 Units/hr at 05/24/18 0650 .  hydrALAZINE (APRESOLINE) injection 10 mg, 10 mg, Intravenous, Q2H PRN, Conforti, John, DO, 10 mg at 05/11/18 0110 .  insulin aspart (novoLOG) injection 0-20 Units, 0-20 Units, Subcutaneous, Q4H, Merwyn KatosSimonds, David B, MD, 3 Units at 05/24/18 0343 .  ipratropium-albuterol (DUONEB) 0.5-2.5 (3) MG/3ML nebulizer solution 3 mL, 3 mL, Nebulization, Q8H, Merwyn KatosSimonds, David B, MD, 3 mL at 05/24/18 0741 .  LORazepam (ATIVAN) injection 0.5-1 mg, 0.5-1 mg, Intravenous, Q4H PRN, Merwyn KatosSimonds, David B, MD, 1 mg at 05/23/18 1845 .  MEDLINE mouth rinse, 15 mL, Mouth Rinse, q12n4p, Tukov-Yual, Magdalene S, NP, 15 mL at 05/22/18 1400 .  metoprolol tartrate (LOPRESSOR) injection 2.5-5 mg, 2.5-5 mg, Intravenous, Q3H PRN, Merwyn KatosSimonds, David B, MD .   morphine 2 MG/ML injection 1 mg, 1 mg, Intravenous, Q4H PRN, [place] BilberryPatel, Shreyang, MD, 1 mg at 05/23/18 0446 .  ondansetron (ZOFRAN) injection 4 mg, 4 mg, Intravenous, Q6H PRN, Conforti, John, DO, 4 mg at 05/24/18 0219 .  senna-docusate (Senokot-S) tablet 2 tablet, 2 tablet, Per Tube, BID, [place] SirenSainani, Vivek J, MD, 2 tablet at 05/19/18 0926 .  sodium chloride flush (NS) 0.9 % injection 10-40 mL, 10-40 mL, Intracatheter, Q12H, Pyreddy, Pavan, MD, 10 mL at 05/23/18 2146 .  sodium chloride flush (NS) 0.9 % injection 10-40 mL, 10-40 mL, Intracatheter, PRN, Ihor AustinPyreddy, Pavan, MD .  Warfarin - Pharmacist Dosing Inpatient, , Does not apply, q1800, [place] BilberryPatel, Shreyang, MD   Allergies:  Lexapro [escitalopram oxalate]   Review of Systems:  Constitutional: no distress Cardiovascular: No chest pain.  Pulmonary: Denies dyspnea.   The remainder of systems were reviewed and were found to be negative other than what is documented in the HPI.   Physical Examination:   VS: BP (!) 149/98   Pulse 91   Temp 98 F (36.7 C) (Oral)   Resp 16   Ht  5\' 11"  (1.803 m)   Wt (!) 346 lb 5.5 oz (157.1 kg)   SpO2 95%   BMI 48.30 kg/m   General Appearance: No distress, obese.  Neuro:without focal findings, mental status normal. HEENT: PERRLA, trach site decannulated, stoma appears bloody.  Pulmonary: No wheezing, No rales  CardiovascularNormal S1,S2.  No m/r/g.  Abdomen: Benign, Soft, non-tender, No masses Renal:  No costovertebral tenderness  GU:  No performed at this time. Endoc: No evident thyromegaly, no signs of acromegaly or Cushing features Skin:   warm, no rashes, no ecchymosis  Extremities: normal, no cyanosis, clubbing.      LABORATORY PANEL:   CBC Recent Labs  Lab 05/24/18 0334  WBC 6.3  HGB 11.4*  HCT 34.2*  PLT 224   ------------------------------------------------------------------------------------------------------------------  Chemistries  Recent Labs  Lab 05/24/18 0334  NA 136  K 3.7    CL 96*  CO2 31  GLUCOSE 129*  BUN 8  CREATININE 2.01*  CALCIUM 8.6*  MG 1.5*   ------------------------------------------------------------------------------------------------------------------  Cardiac Enzymes No results for input(s): TROPONINI in the last 168 hours. ------------------------------------------------------------  RADIOLOGY:   No results found for this or any previous visit. Results for orders placed during the hospital encounter of 12/06/17  DG Chest 2 View   Narrative CLINICAL DATA:  Shortness of breath.  Hypoxia.  EXAM: CHEST  2 VIEW  COMPARISON:  11/16/2017.  FINDINGS: Prior median sternotomy and cardiac valve replacement. Cardiomegaly with pulmonary vascular prominence. Similar findings on prior exam. Slight improvement in left base atelectasis. Stable elevation left hemidiaphragm. No pleural effusion or pneumothorax. Degenerative changes thoracic spine.  IMPRESSION: 1. Prior median sternotomy and cardiac valve replacement. Stable cardiomegaly and pulmonary venous congestion. No overt pulmonary edema.  2. Mild improvement of left base atelectasis. Persistent elevation left hemidiaphragm.   Electronically Signed   By: Maisie Fus  Register   On: 12/06/2017 10:16    ------------------------------------------------------------------------------------------------------------------  Thank  you for allowing [facility] to assist in the care of your patient. Our recommendations are noted above.  Please contact us if we can be of further service.   05/24/2018

## 2018-05-24 NOTE — Progress Notes (Signed)
BiPAP machine checked for damage. No visible damage noted, wires and plastic out casing intact, no cracks or dents noted

## 2018-05-24 NOTE — Progress Notes (Signed)
ANTICOAGULATION CONSULT NOTE   Pharmacy Consult for heparin drip management Indication: atrial fibrillation and aortic valve replacement   Allergies  Allergen Reactions  . Lexapro [Escitalopram Oxalate] Swelling    Patient Measurements: Height: 5\' 11"  (180.3 cm) Weight: (!) 346 lb 5.5 oz (157.1 kg) IBW/kg (Calculated) : 75.3 Heparin Dosing Weight: 114 kg (updated 7/16)  Vital Signs: BP: 143/99 (07/17 1315) Pulse Rate: 119 (07/17 1200)  Labs: Recent Labs    05/22/18 0529 05/23/18 0443 05/23/18 1137 05/23/18 1943 05/24/18 0334 05/24/18 0542  HGB 11.4* 11.7*  --   --  11.4*  --   HCT 34.1* 35.5*  --   --  34.2*  --   PLT 224 229  --   --  224  --   LABPROT  --  15.8*  --   --   --   --   INR  --  1.27  --   --   --   --   HEPARINUNFRC 0.41 0.31 0.39 0.52  --  0.62  CREATININE 2.12* 2.06*  --   --  2.01*  --     Estimated Creatinine Clearance: 67.9 mL/min (A) (by C-G formula based on SCr of 2.01 mg/dL (H)).   Medical History: Past Medical History:  Diagnosis Date  . Allergy   . Anxiety   . Arrhythmia   . Arthritis   . Atrial fibrillation (HCC)   . CHF (congestive heart failure) (HCC)   . CKD (chronic kidney disease)   . Diabetes mellitus without complication (HCC)   . Diabetes mellitus, type II (HCC)   . Heart failure (HCC)   . Hyperlipidemia   . Hypertension   . Pancreatitis   . Sleep apnea     Medications:  Scheduled:  . ALPRAZolam  0.25 mg Oral Once  . chlorhexidine  15 mL Mouth Rinse BID  . diltiazem  60 mg Oral Q6H  . feeding supplement (NEPRO CARB STEADY)  237 mL Oral TID BM  . fentaNYL  50 mcg Transdermal Q72H  . insulin aspart  0-20 Units Subcutaneous Q4H  . ipratropium-albuterol  3 mL Nebulization Q8H  . mouth rinse  15 mL Mouth Rinse q12n4p  . senna-docusate  2 tablet Per Tube BID  . sodium chloride flush  10-40 mL Intracatheter Q12H  . warfarin  5 mg Oral q1800  . Warfarin - Pharmacist Dosing Inpatient   Does not apply q1800    Infusions:  . dextrose 5 % and 0.45 % NaCl with KCl 20 mEq/L 50 mL/hr at 05/24/18 1400  . heparin 2,050 Units/hr (05/24/18 1400)    Assessment: Pharmacy consulted for warfarin and heparin drip management for 47 yo male admitted to the ICU s/p cardiac arrest. Patient has history significant for aortic valve replacement, atrial fibrillation and CHF. Head CT is negative for infarction or hemorrhage.   Patient takes warfarin 5mg  daily as an outpatient for goal INR 2.5-3.5.   Goal of Therapy:  INR 2.5 - 3.5 Heparin level 0.3-0.7 units/ml Monitor platelets by anticoagulation protocol: Yes   Plan:  Heparin level within range. Continue heparin infusion at current rate recheck heparin level with AM labs.  Patient able to eat this morning, will initiate warfarin 5mg  (patient home dose). Goal INR is 2.5-3.5. Patient will need to bridged for minimum of 5 days and two consecutive INRs in target range.   Pharmacy will continue to monitor and adjust per consult.     MLS 05/24/2018 3:34 PM

## 2018-05-24 NOTE — Progress Notes (Signed)
Pt has his personal CPAP mcachine and an order to use it at night. The cords were checked for frays and there were none. It seems to be in good working condition. The pt wore it until he came to the hospital. biomed was contacted.

## 2018-05-24 NOTE — Progress Notes (Signed)
ANTICOAGULATION CONSULT NOTE   Pharmacy Consult for heparin drip management Indication: atrial fibrillation and aortic valve replacement   Allergies  Allergen Reactions  . Lexapro [Escitalopram Oxalate] Swelling    Patient Measurements: Height: 5\' 11"  (180.3 cm) Weight: (!) 346 lb 5.5 oz (157.1 kg) IBW/kg (Calculated) : 75.3 Heparin Dosing Weight: 114 kg (updated 7/16)  Vital Signs: Temp: 98 F (36.7 C) (07/17 0154) Temp Source: Oral (07/17 0154) BP: 149/98 (07/17 0400) Pulse Rate: 91 (07/17 0400)  Labs: Recent Labs    05/22/18 0529 05/23/18 0443 05/23/18 1137 05/23/18 1943 05/24/18 0334 05/24/18 0542  HGB 11.4* 11.7*  --   --  11.4*  --   HCT 34.1* 35.5*  --   --  34.2*  --   PLT 224 229  --   --  224  --   LABPROT  --  15.8*  --   --   --   --   INR  --  1.27  --   --   --   --   HEPARINUNFRC 0.41 0.31 0.39 0.52  --  0.62  CREATININE 2.12* 2.06*  --   --  2.01*  --     Estimated Creatinine Clearance: 67.9 mL/min (A) (by C-G formula based on SCr of 2.01 mg/dL (H)).   Medical History: Past Medical History:  Diagnosis Date  . Allergy   . Anxiety   . Arrhythmia   . Arthritis   . Atrial fibrillation (HCC)   . CHF (congestive heart failure) (HCC)   . CKD (chronic kidney disease)   . Diabetes mellitus without complication (HCC)   . Diabetes mellitus, type II (HCC)   . Heart failure (HCC)   . Hyperlipidemia   . Hypertension   . Pancreatitis   . Sleep apnea     Medications:  Scheduled:  . ALPRAZolam  0.25 mg Oral Once  . chlorhexidine  15 mL Mouth Rinse BID  . fentaNYL  50 mcg Transdermal Q72H  . insulin aspart  0-20 Units Subcutaneous Q4H  . ipratropium-albuterol  3 mL Nebulization Q8H  . mouth rinse  15 mL Mouth Rinse q12n4p  . senna-docusate  2 tablet Per Tube BID  . sodium chloride flush  10-40 mL Intracatheter Q12H  . Warfarin - Pharmacist Dosing Inpatient   Does not apply q1800   Infusions:  . dextrose 5 % and 0.45 % NaCl with KCl 20 mEq/L 50  mL/hr at 05/23/18 1949  . diltiazem (CARDIZEM) infusion 10 mg/hr (05/24/18 0111)  . heparin 2,050 Units/hr (05/23/18 1949)  . magnesium sulfate 1 - 4 g bolus IVPB 4 g (05/24/18 0507)    Assessment: Pharmacy consulted for warfarin and heparin drip management for 47 yo male admitted to the ICU s/p cardiac arrest. Patient has history significant for aortic valve replacement, atrial fibrillation and CHF. Head CT is negative for infarction or hemorrhage.   Patient takes warfarin 5mg  daily as an outpatient for goal INR 2.5-3.5.   Goal of Therapy:  INR 2.5 - 3.5 Heparin level 0.3-0.7 units/ml Monitor platelets by anticoagulation protocol: Yes   Plan:  07/17 @ 0600 HL 0.62 therapeutic. Will continue rate at 2050 units/hr and will recheck next HL w/ am labs. CBC stable.  Thomasene Rippleavid Jaelynne Hockley, PharmD, BCPS Clinical Pharmacist 05/24/2018

## 2018-05-24 NOTE — Progress Notes (Signed)
Dr. Allena KatzPatel notified of concerns regarding the venous ultrasound abnormality and what was discussed between Dr. Sherryll BurgerShah and Dr. Grace IsaacWatts.  No new orders at this time.  Dr. Allena KatzPatel will follow up.  Patient brought in home cpap for use; [facility].

## 2018-05-24 NOTE — Progress Notes (Signed)
Pt pulled Bipap mask pulled it off , Pt stated that he doesn't want no blood taken  Out of hand.

## 2018-05-24 NOTE — Care Management (Signed)
RNCM spoke with [facility] at [facility]. Insurance has denied LTAC after Peer to Peer. Patient came back to ICU yesterday after for issues with trach however he appears to be doing well on 2L O2 via [place].

## 2018-05-24 NOTE — Evaluation (Signed)
Clinical/Bedside Swallow Evaluation Patient Details  Name: Alexander RiffleOtis Leon Haeberle MRN: [id number] Date of Birth: 11/17/1970  Today's Date: 05/24/2018 Time: SLP Start Time (ACUTE ONLY): 0834 SLP Stop Time (ACUTE ONLY): 0930 SLP Time Calculation (min) (ACUTE ONLY): 56 min  Past Medical History:  Past Medical History:  Diagnosis Date  . Allergy   . Anxiety   . Arrhythmia   . Arthritis   . Atrial fibrillation (HCC)   . CHF (congestive heart failure) (HCC)   . CKD (chronic kidney disease)   . Diabetes mellitus without complication (HCC)   . Diabetes mellitus, type II (HCC)   . Heart failure (HCC)   . Hyperlipidemia   . Hypertension   . Pancreatitis   . Sleep apnea    Past Surgical History:  Past Surgical History:  Procedure Laterality Date  . ABLATION  08/01/2017  . IR GASTROSTOMY TUBE MOD SED  05/04/2018  . MECHANICAL AORTIC VALVE REPLACEMENT    . PEG PLACEMENT Left 04/28/2018   Procedure: PERCUTANEOUS ENDOSCOPIC GASTROSTOMY (PEG) PLACEMENT;  Surgeon: Pasty Spillersahiliani, Varnita B, MD;  Location: [facility] ENDOSCOPY;  Service: Endoscopy;  Laterality: Left;  . TRACHEOSTOMY TUBE PLACEMENT N/A 04/25/2018   Procedure: TRACHEOSTOMY;  Surgeon: Vernie MurdersJuengel, Paul, MD;  Location: [facility] ORS;  Service: ENT;  Laterality: N/A;   HPI:  Alexander Alexander is a 47 y.o. male with PMH of atrial fibrillation, s/p ablation and aortic valve replacement on long term warfarin, CHF, CKD, diabetes, obstructive sleep apnea, obesity, and hypertension. Alexander Alexander. had a pulseless VT cardiac arrest in MRI when he was being evaluated for back pain and sciatica, subsequently intubated in the intensive care unit. Intubated from 5/31-6/18 when tracheostomy was placed. Alexander Alexander is not on Trach Collar w/ O2 support. Of note, Alexander Alexander also with L LE DVT currently on heparin drip diagnosed on 6/21. PEG tube has been considered by Surgery but not placed. Alexander Alexander appears much improved and is sitting in a chair post Alexander Alexander session. He presents w/ alertness and is able to engage in the tx  session. Alexander Alexander has moved out of CCU into a regular room; on trach collar O2 support. Yesterday AM, Alexander Alexander accidently decannulated himself. ENT was consulted and it was determined that Alexander Alexander should be monitored for need to replace trach. Alexander Alexander has been tolerating decannulation for 24 hours maintaining O2 sats, respiratory status adequately per MD. Alexander Alexander is verbally conversive; no apparent air leakage from stoma as vocal quality is strong, clear w/ adequat volume. Alexander Alexander continues to be hampered by deconditioned status.    Assessment / Plan / Recommendation Clinical Impression  Alexander Alexander appears to present w/ adequate oropharyngeal phase swallowing function w/ reduced risk for aspiration from an oropharyngeal phase standpoint. Alexander Alexander has not been taking an oral diet since admission and c/o "fullness" feelings quickly w/ po trials/oral intake(even in small amounts) at this eval. ANY Esophageal dysmotility or Reflux can increase risk for aspiration of backflow material thus impact Pulmonary status.  Alexander Alexander consumed po trials including thin liquids via Cup/Straw and purees/softened solids w/ no overt s/s of aspiration noted; no decline in vocal quality noted. O2 sats remained in the upper 90s. Oral phase was grossly [facility] for bolus management and oral clearing of the mech soft trials(easier mastication and allows conservation of energy during mastication). Alexander Alexander helped to feed self holding Cup; noted min shakiness of hands. Alexander Alexander has a baseline of weakness and fatigue from his extended illness and needs support w/ oral intake tasks.  Recommend a Mech Soft diet w/ thin liquids; general aspiration  and Reflux precautions; PIlls in Puree for easier swallowing/clearing for Alexander Alexander. Recommend support at meals and Rest Breaks to lessen fatigue during meals.  SLP Visit Diagnosis: Dysphagia, unspecified (R13.10)(prolonged tracheostomy)    Aspiration Risk  (reduced following general aspiration precautions)    Diet Recommendation  Mech Soft diet w/ cut, moistened  meats/foods; Thin liquids. General aspiration and reflux precautions. Support at meals and Rest Breaks as needed to lessen fatigue.  Medication Administration: Whole meds with puree(for easier, safer swallowing as Alexander Alexander desires)    Other  Recommendations Recommended Consults: (Dietician following) Oral Care Recommendations: Oral care BID;Patient independent with oral care(assist) Other Recommendations: (n/a)   Follow up Recommendations None      Frequency and Duration min 1 x/week  1 week       Prognosis Prognosis for Safe Diet Advancement: Good Barriers to Reach Goals: (deconditioned)      Swallow Study   General Date of Onset: 04/07/18 HPI: Alexander Alexander is a 47 y.o. male with PMH of atrial fibrillation, s/p ablation and aortic valve replacement on long term warfarin, CHF, CKD, diabetes, obstructive sleep apnea, obesity, and hypertension. Alexander Alexander. had a pulseless VT cardiac arrest in MRI when he was being evaluated for back pain and sciatica, subsequently intubated in the intensive care unit. Intubated from 5/31-6/18 when tracheostomy was placed. Alexander Alexander is not on Trach Collar w/ O2 support. Of note, Alexander Alexander also with L LE DVT currently on heparin drip diagnosed on 6/21. PEG tube has been considered by Surgery but not placed. Alexander Alexander appears much improved and is sitting in a chair post Alexander Alexander session. He presents w/ alertness and is able to engage in the tx session. Alexander Alexander has moved out of CCU into a regular room; on trach collar O2 support. Yesterday AM, Alexander Alexander accidently decannulated himself. ENT was consulted and it was determined that Alexander Alexander should be monitored for need to replace trach. Alexander Alexander has been tolerating decannulation for 24 hours maintaining O2 sats, respiratory status adequately per MD. Alexander Alexander is verbally conversive; no apparent air leakage from stoma as vocal quality is strong, clear w/ adequat volume. Alexander Alexander continues to be hampered by deconditioned status.  Type of Study: Bedside Swallow Evaluation Previous Swallow Assessment:  none Diet Prior to this Study: NPO(has received NG TFs; ice chips w/ NSG) Temperature Spikes Noted: (wbc 6.3; temp 99.9) Respiratory Status: Nasal cannula(4 liters) History of Recent Intubation: (tracheostomy - decannulated ~24+ hours ago) Behavior/Cognition: Alert;Cooperative;Pleasant mood(easily fatigued) Oral Cavity Assessment: Within Functional Limits Oral Care Completed by SLP: Recent completion by staff Oral Cavity - Dentition: Adequate natural dentition Vision: Functional for self-feeding Self-Feeding Abilities: Able to feed self;Needs assist;Needs set up(overall weakness) Patient Positioning: Upright in bed(EOB) Baseline Vocal Quality: Normal;Low vocal intensity(min but expected post decannulation) Volitional Cough: Strong Volitional Swallow: Able to elicit    Oral/Motor/Sensory Function Overall Oral Motor/Sensory Function: Within functional limits   Ice Chips Ice chips: Within functional limits Presentation: Spoon(fed; 4 trials)   Thin Liquid Thin Liquid: Within functional limits Presentation: Cup;Self Fed;Straw(assisted; ~3 ozs )    Nectar Thick Nectar Thick Liquid: Not tested   Honey Thick Honey Thick Liquid: Not tested   Puree Puree: Not tested Presentation: Spoon(assisted; 5 trials)   Solid   GO   Solid: (Mech Soft trial foods; 8 trials) Presentation: Spoon(assisted) Other Comments: took his time         Jerilynn Som, MS, CCC-SLP Jamell Opfer 05/24/2018,11:50 AM

## 2018-05-24 NOTE — Progress Notes (Signed)
PT Cancellation Note  Patient Details Name: Alexander RiffleOtis Leon Alexander MRN: [id number] DOB: Jul 24, 1971   Cancelled Treatment:    Reason Eval/Treat Not Completed: Patient at procedure or test/unavailable. Called RN to check on pt status and he is currently out of room for UE doppler. Will attempt PT treatment on later date/time as pt is available.   Sharalyn InkJason D Huprich PT, DPT, GCS  Huprich,Jason 05/24/2018, 4:09 PM

## 2018-05-24 NOTE — Progress Notes (Signed)
Patient with RUE dvt study initially was called in as some abnormality, Spoke with IR Dr Grace IsaacWatts who did scan by himself and called me back stating he does not think this is a foreign body but might be non occlusive thrombus.   Suggested xray to confirm Patient is moving out of ICU  Will let hospitalist follow on it  Lei Dower [facility]

## 2018-05-25 LAB — HEPARIN LEVEL (UNFRACTIONATED): Heparin Unfractionated: 0.56 IU/mL (ref 0.30–0.70)

## 2018-05-25 LAB — LACTIC ACID, PLASMA: Lactic Acid, Venous: 0.9 mmol/L (ref 0.5–1.9)

## 2018-05-25 LAB — GLUCOSE, CAPILLARY
GLUCOSE-CAPILLARY: 119 mg/dL — AB (ref 70–99)
GLUCOSE-CAPILLARY: 135 mg/dL — AB (ref 70–99)
GLUCOSE-CAPILLARY: 152 mg/dL — AB (ref 70–99)
Glucose-Capillary: 128 mg/dL — ABNORMAL HIGH (ref 70–99)
Glucose-Capillary: 110 mg/dL — ABNORMAL HIGH (ref 70–99)
Glucose-Capillary: 134 mg/dL — ABNORMAL HIGH (ref 70–99)

## 2018-05-25 LAB — PROTIME-INR
INR: 1.34
PROTHROMBIN TIME: 16.5 s — AB (ref 11.4–15.2)

## 2018-05-25 MED ORDER — ALTEPLASE 2 MG IJ SOLR: 2.0000 mg | Freq: Once | INTRAMUSCULAR | Status: DC

## 2018-05-25 MED ORDER — HYDRALAZINE HCL 20 MG/ML IJ SOLN: 5.0000 mg | Freq: Four times a day (QID) | INTRAMUSCULAR | Status: DC | PRN

## 2018-05-25 MED ORDER — WARFARIN SODIUM 7.5 MG PO TABS
7.5000 mg | ORAL_TABLET | Freq: Once | ORAL | Status: AC
  Administered 2018-05-25: 7.5 mg via ORAL
  Filled 2018-05-25: qty 1

## 2018-05-25 MED ORDER — ENOXAPARIN SODIUM 150 MG/ML ~~LOC~~ SOLN
150.0000 mg | Freq: Two times a day (BID) | SUBCUTANEOUS | Status: DC
  Administered 2018-05-25 – 2018-05-26 (×2): 150 mg via SUBCUTANEOUS
  Filled 2018-05-25 (×3): qty 1

## 2018-05-25 MED ORDER — IPRATROPIUM-ALBUTEROL 0.5-2.5 (3) MG/3ML IN SOLN
3.0000 mL | Freq: Two times a day (BID) | RESPIRATORY_TRACT | Status: DC
  Administered 2018-05-25 – 2018-05-26 (×2): 3 mL via RESPIRATORY_TRACT
  Filled 2018-05-25 (×2): qty 3

## 2018-05-25 MED FILL — Diltiazem HCl 100 MG/100ML in Dextrose 5% IV Soln (1 MG/ML): INTRAVENOUS | Qty: 100 | Status: AC

## 2018-05-25 NOTE — Progress Notes (Signed)
[employer] [facility] [place] [facility]   PATIENT NAME: Alexander Alexander    MR#:  [id number]  DATE OF BIRTH:  05-31-1971  SUBJECTIVE:   Patient doing much better is interested in going home soon wants to go out in the wheelchair with his wife   REVIEW OF SYSTEMS:    Review of Systems  Constitutional: Negative for chills and fever.  HENT: Negative for congestion and tinnitus.   Eyes: Negative for blurred vision and double vision.  Respiratory: Negative for cough, shortness of breath and wheezing.   Cardiovascular: Negative for chest pain, orthopnea and PND.  Gastrointestinal: Negative for abdominal pain, diarrhea, nausea and vomiting.  Genitourinary: Negative for dysuria and hematuria.  Neurological: Negative for dizziness, sensory change and focal weakness.  All other systems reviewed and are negative.   Nutrition: Tube feeds  Tolerating Diet: No Tolerating PT: Await Eval.   DRUG ALLERGIES:   Allergies  Allergen Reactions  . Lexapro [Escitalopram Oxalate] Swelling    VITALS:  Blood pressure (!) 148/102, pulse 88, temperature 98.3 F (36.8 C), temperature source Oral, resp. rate 17, height 5\' 11"  (1.803 m), weight (!) 155.1 kg (342 lb), SpO2 97 %.  PHYSICAL EXAMINATION:   Physical Exam  GENERAL:  47 y.o.-year-old patient lying in bed s/p Trach is in distress EYES: Pupils equal, round, reactive to light. No scleral icterus. Extraocular muscles intact.  HEENT: Head atraumatic, normocephalic. Oropharynx and nasopharynx clear. S/p Trach and on vent.  NG tube in place  NECK:  Supple, no jugular venous distention. No thyroid enlargement, no tenderness.  LUNGS: Decreased breath sounds bilaterally with some accessory muscle usage CARDIOVASCULAR: S1, S2 RRR, + Metallic valve click. No murmurs, rubs, or gallops.  ABDOMEN: Soft, nontender, nondistended. Bowel sounds present. No organomegaly or mass.  EXTREMITIES: No cyanosis, clubbing or edema b/l.    NEUROLOGIC:  Cranial nerves II through XII are intact. No focal Motor or sensory deficits b/l. Globally weak  PSYCHIATRIC: The patient is alert and oriented x 3. SKIN: No obvious rash, lesion, or ulcer.    LABORATORY PANEL:   CBC Recent Labs  Lab 05/24/18 0334  WBC 6.3  HGB 11.4*  HCT 34.2*  PLT 224   ------------------------------------------------------------------------------------------------------------------  Chemistries  Recent Labs  Lab 05/24/18 0334  NA 136  K 3.7  CL 96*  CO2 31  GLUCOSE 129*  BUN 8  CREATININE 2.01*  CALCIUM 8.6*  MG 1.5*   ------------------------------------------------------------------------------------------------------------------  Cardiac Enzymes No results for input(s): TROPONINI in the last 168 hours. ------------------------------------------------------------------------------------------------------------------  RADIOLOGY:  Dg Chest 1 View  Result Date: 05/24/2018 CLINICAL DATA:  Shortness of breath EXAM: CHEST  1 VIEW COMPARISON:  05/23/2018 FINDINGS: Interval removal of right PICC line. Left PICC line tip in the SVC. Cardiomegaly. Prior median sternotomy and valve replacement. Vascular congestion and bibasilar atelectasis. IMPRESSION: Increasing vascular congestion and bibasilar atelectasis. Electronically Signed   By: Charlett Nose M.D.   On: 05/24/2018 10:11   Dg Shoulder 1v Right  Result Date: 05/24/2018 CLINICAL DATA:  Right shoulder pain. EXAM: RIGHT SHOULDER - 1 VIEW COMPARISON:  None. FINDINGS: There is no evidence of fracture or dislocation. There is no evidence of arthropathy or other focal bone abnormality. Soft tissues are unremarkable. IMPRESSION: No acute abnormality on this single view of the right shoulder. Electronically Signed   By: Marin Roberts M.D.   On: 05/24/2018 12:10   US Venous Img Upper Bilat  Result Date: 05/24/2018 CLINICAL DATA:  Right upper extremity pain.  Patient had recent right upper extremity PICC  line and new left upper extremity PICC line placed. EXAM: BILATERAL UPPER EXTREMITY VENOUS DOPPLER ULTRASOUND TECHNIQUE: Gray-scale sonography with graded compression, as well as color Doppler and duplex ultrasound were performed to evaluate the bilateral upper extremity deep venous systems from the level of the subclavian vein and including the jugular, axillary, basilic, radial, ulnar and upper cephalic vein. Spectral Doppler was utilized to evaluate flow at rest and with distal augmentation maneuvers. COMPARISON:  Chest radiograph-05/24/2018; 05/23/2018; right shoulder radiographs-05/24/2018 FINDINGS: RIGHT UPPER EXTREMITY Internal Jugular Vein: No evidence of thrombus. Normal compressibility, respiratory phasicity and response to augmentation. Subclavian Vein: No evidence of thrombus. Normal compressibility, respiratory phasicity and response to augmentation. Axillary Vein: No evidence of thrombus. Normal compressibility, respiratory phasicity and response to augmentation. Cephalic Vein: There is a linear nonocclusive structure within the right cephalic vein at the location of recently removed right upper extremity approach PICC line at the level the mid humerus (representative images 14 and 15). The cephalic vein appears patent both proximally and distally to this location. Note, this was confirmed with real-time sonographic evaluation performed by the dictating interventional radiologist. Basilic Vein: No evidence of thrombus. Normal compressibility, respiratory phasicity and response to augmentation. Brachial Veins: No evidence of thrombus. Normal compressibility, respiratory phasicity and response to augmentation. Radial Veins: No evidence of thrombus. Normal compressibility, respiratory phasicity and response to augmentation. Ulnar Veins: No evidence of thrombus. Normal compressibility, respiratory phasicity and response to augmentation. Venous Reflux:  None. Other Findings:  None. LEFT UPPER EXTREMITY  Internal Jugular Vein: No evidence of thrombus. Normal compressibility, respiratory phasicity and response to augmentation. Subclavian Vein: No evidence of thrombus. Normal compressibility, respiratory phasicity and response to augmentation. Axillary Vein: No evidence of thrombus. Normal compressibility, respiratory phasicity and response to augmentation. Cephalic Vein: No evidence of thrombus. Normal compressibility, respiratory phasicity and response to augmentation. Basilic Vein: No evidence of thrombus. Normal compressibility, respiratory phasicity and response to augmentation. Brachial Veins: No evidence of thrombus. Normal compressibility, respiratory phasicity and response to augmentation. Radial Veins: No evidence of thrombus. Normal compressibility, respiratory phasicity and response to augmentation. Ulnar Veins: No evidence of thrombus. Normal compressibility, respiratory phasicity and response to augmentation. Venous Reflux:  None. Other Findings:  None. IMPRESSION: 1. Nonocclusive linear echogenic structure within the right cephalic vein is favored to represent non occlusive superficial thrombophlebitis as sequela of recently removed right upper extremity approach PICC line placement. Further evaluation with dedicated right humerus radiographs could be performed to exclude the presence of a residual foreign body, however this is felt to be less likely based on real-time sonographic evaluation performed by the dictating interventional radiologist. 2. No evidence of DVT within the right upper extremity. 3. No evidence of DVT within the left upper extremity. Above findings discussed with Dr. Sherryll Burger at time of procedure completion. Electronically Signed   By: Simonne Come M.D.   On: 05/24/2018 17:38   Dg Chest Port 1 View  Result Date: 05/23/2018 CLINICAL DATA:  PICC placement EXAM: PORTABLE CHEST 1 VIEW COMPARISON:  05/23/2018 FINDINGS: Right arm PICC tip in the right axillary vein, unchanged from the  prior study. New left arm PICC tip in the proximal SVC. Cardiac enlargement and CABG. Negative for heart failure. Left lower lobe atelectasis. IMPRESSION: Left arm PICC tip in the SVC No change in right arm PICC tip in the right axillary vein Left lower lobe atelectasis. Electronically Signed   By: Marlan Palau M.D.   On: 05/23/2018  18:24   [place] Ekg Site Rite  Result Date: 05/23/2018 If Site Rite image not attached, placement could not be confirmed due to current cardiac rhythm.    ASSESSMENT AND PLAN:   47 year old male with past medical history significant for A. fib status post ablation, aortic valve replacement on warfarin, history of congestive heart failure, CKD, morbid obesity, diabetes and sleep apnea who was getting an outpatient MRI of his lower back had a cardiac arrest.  * Acute cardiopulmonary arrest  - Pulseless VT echocardiogram reviewed normal, no evidence of heart attack, cause for his pulseless VT unclear -Incentive spirometry   * AcuteMRSA/VAP Treated withcourse ofLinezolid - afebrile/hemodynamically stable.   *Atrial fibrillationwith RVR - rate controlled.  - Continuemetoprolol,Cardizem.  -We will transition patient to Coumadin I will start Lovenox  * History of mechanical aortic valve replacement - cont. Heparin gtt. convert to Coumadin if able to take orally  * Acute toxic metabolic encephalopathy  - Neurology input appreciated  - initially Started on Kepprafor possible partial seizures. Later stopped. - mental status back to baseline, no acute seizures.   * Chronic diabetes mellitus type 2 - cont. SSI. BS stable.   * Acute renal failurew/ CKD stage III Secondary toATN, Cr. Close to baseline now.   * N/V -resolved.  Possibly due to constipation.   Improved  * nutrition -patient able to eat starting today All the records are reviewed and case discussed with Care Management/Social Worker. Management plans discussed with the patient,  family and they are in agreement.  CODE STATUS: full code  DVT Prophylaxis: Heparin gtt  TOTAL TIME TAKING CARE OF THIS PATIENT: 35 minutes critical care time spent   POSSIBLE D/C unclear, DEPENDING ON CLINICAL CONDITION and progress.    Auburn BilberryShreyang Charmane Protzman M.D on 05/25/2018 at 3:13 PM  Between 7am to 6pm - Pager - [phone]  After 6pm go to [email or web address] - Social research officer, governmentpassword EPAS [facility]  [facility] [place] [facility]  Office  [phone]  CC: Primary care physician; Erasmo DownerBacigalupo, Angela M, MD

## 2018-05-25 NOTE — Progress Notes (Signed)
ANTICOAGULATION CONSULT NOTE   Pharmacy Consult for warfarin with a Lovenox bridge Indication: atrial fibrillation and a mechanical aortic valve replacement  Allergies  Allergen Reactions  . Lexapro [Escitalopram Oxalate] Swelling    Patient Measurements: Height: 5\' 11"  (180.3 cm) Weight: (!) 342 lb (155.1 kg) IBW/kg (Calculated) : 75.3  Vital Signs: Temp: 98.3 F (36.8 C) (07/18 0736) Temp Source: Oral (07/18 0736) BP: 148/102 (07/18 0736) Pulse Rate: 88 (07/18 0736)  Labs: Recent Labs    05/23/18 0443  05/23/18 1943 05/24/18 0334 05/24/18 0542 05/25/18 0524  HGB 11.7*  --   --  11.4*  --   --   HCT 35.5*  --   --  34.2*  --   --   PLT 229  --   --  224  --   --   LABPROT 15.8*  --   --   --   --  16.5*  INR 1.27  --   --   --   --  1.34  HEPARINUNFRC 0.31   < > 0.52  --  0.62 0.56  CREATININE 2.06*  --   --  2.01*  --   --    < > = values in this interval not displayed.    Estimated Creatinine Clearance: 67.4 mL/min (A) (by C-G formula based on SCr of 2.01 mg/dL (H)).   Medical History: Past Medical History:  Diagnosis Date  . Allergy   . Anxiety   . Arrhythmia   . Arthritis   . Atrial fibrillation (HCC)   . CHF (congestive heart failure) (HCC)   . CKD (chronic kidney disease)   . Diabetes mellitus without complication (HCC)   . Diabetes mellitus, type II (HCC)   . Heart failure (HCC)   . Hyperlipidemia   . Hypertension   . Pancreatitis   . Sleep apnea     Assessment: 47 year old male with past medical history significant for A. fib status post ablation, aortic valve replacement on warfarin, history of congestive heart failure, CKD, morbid obesity, diabetes and sleep apnea who was getting an outpatient MRI of his lower back had a cardiac arrest. He has been on a heparin drip until today and warfarin was re-started yesterday (7/17). Today we are asked to switch from heparin to Lovenox until INR is therapeutic. His home dose of warfarin is 5mg   daily.   INR  Dose 7/17 1.27  5 mg 7/18 1.34  7.5 mg  Goal of Therapy:  INR 2-3 Monitor platelets by anticoagulation protocol: Yes   Plan:  In order to increase INR to goal more quickly we will give a 7.5mg  dose today and then assess INR again in the morning.  Lowella Bandyodney D Lavert Matousek, PharmD 05/25/2018,10:16 AM

## 2018-05-25 NOTE — Progress Notes (Deleted)
test

## 2018-05-25 NOTE — Progress Notes (Signed)
Nurse in to draw labs as ordered.  PICC line white and gray ports without blood return and difficult to flush.  (Heparin infusing via red port).  IV team consulted for assessment.  IV team nurse unable to flush or have blood return to all ports. IV nurse recommended TPA to PICC line.  Patient informed of process and became upset.  States [place] discontinued [place]. Upon futher questing, patient request PICC line be remove at this time.  Dr Marjie SkiffSridharan notified of PICC line complications and RUE ultrasound results. Order given to discontinue PICC and insert peripheral line.  IV team made aware.

## 2018-05-25 NOTE — Progress Notes (Signed)
ANTICOAGULATION CONSULT NOTE   Pharmacy Consult for heparin drip management Indication: atrial fibrillation and aortic valve replacement   Allergies  Allergen Reactions  . Lexapro [Escitalopram Oxalate] Swelling    Patient Measurements: Height: 5\' 11"  (180.3 cm) Weight: (!) 342 lb (155.1 kg) IBW/kg (Calculated) : 75.3 Heparin Dosing Weight: 114 kg (updated 7/16)  Vital Signs: Temp: 98.8 F (37.1 C) (07/18 0429) Temp Source: Oral (07/18 0429) BP: 128/90 (07/18 0429) Pulse Rate: 96 (07/18 0429)  Labs: Recent Labs    05/23/18 0443  05/23/18 1943 05/24/18 0334 05/24/18 0542 05/25/18 0524  HGB 11.7*  --   --  11.4*  --   --   HCT 35.5*  --   --  34.2*  --   --   PLT 229  --   --  224  --   --   LABPROT 15.8*  --   --   --   --  16.5*  INR 1.27  --   --   --   --  1.34  HEPARINUNFRC 0.31   < > 0.52  --  0.62 0.56  CREATININE 2.06*  --   --  2.01*  --   --    < > = values in this interval not displayed.    Estimated Creatinine Clearance: 67.4 mL/min (A) (by C-G formula based on SCr of 2.01 mg/dL (H)).   Medical History: Past Medical History:  Diagnosis Date  . Allergy   . Anxiety   . Arrhythmia   . Arthritis   . Atrial fibrillation (HCC)   . CHF (congestive heart failure) (HCC)   . CKD (chronic kidney disease)   . Diabetes mellitus without complication (HCC)   . Diabetes mellitus, type II (HCC)   . Heart failure (HCC)   . Hyperlipidemia   . Hypertension   . Pancreatitis   . Sleep apnea     Medications:  Scheduled:  . ALPRAZolam  0.25 mg Oral Once  . alteplase  2 mg Intracatheter Once  . alteplase  2 mg Intracatheter Once  . alteplase  2 mg Intracatheter Once  . chlorhexidine  15 mL Mouth Rinse BID  . diltiazem  60 mg Oral Q6H  . feeding supplement (NEPRO CARB STEADY)  237 mL Oral TID BM  . fentaNYL  50 mcg Transdermal Q72H  . insulin aspart  0-20 Units Subcutaneous Q4H  . ipratropium-albuterol  3 mL Nebulization Q8H  . mouth rinse  15 mL Mouth Rinse  q12n4p  . senna-docusate  2 tablet Per Tube BID  . sodium chloride flush  10-40 mL Intracatheter Q12H  . warfarin  5 mg Oral q1800  . Warfarin - Pharmacist Dosing Inpatient   Does not apply q1800   Infusions:  . heparin 2,050 Units/hr (05/25/18 1610)    Assessment: Pharmacy consulted for warfarin and heparin drip management for 47 yo male admitted to the ICU s/p cardiac arrest. Patient has history significant for aortic valve replacement, atrial fibrillation and CHF. Head CT is negative for infarction or hemorrhage.   Patient takes warfarin 5mg  daily as an outpatient for goal INR 2.5-3.5.   Goal of Therapy:  INR 2.5 - 3.5 Heparin level 0.3-0.7 units/ml Monitor platelets by anticoagulation protocol: Yes   Plan:  Heparin level within range. Continue heparin infusion at current rate recheck heparin level with AM labs.  Patient able to eat this morning, will initiate warfarin 5mg  (patient home dose). Goal INR is 2.5-3.5. Patient will need to bridged for minimum  of 5 days and two consecutive INRs in target range.   07/18 @ 0500 HL 0.56 therapeutic. Will continue current rate and will recheck HL w/ am labs.  Thomasene Rippleavid Priyanka Causey, PharmD, BCPS Clinical Pharmacist 05/25/2018

## 2018-05-25 NOTE — Progress Notes (Signed)
Pt has home CPAP plugged into red outlet with 3L O2 in line. Family at bedside

## 2018-05-25 NOTE — Progress Notes (Signed)
LUA PICC line removed per MD order,line intact, small clot at distal end of PICC line. Vaseline gauze dressing applied per protocol.  RN made aware of small clot at end of PICC line.

## 2018-05-25 NOTE — Evaluation (Signed)
Physical Therapy Reevaluation Patient Details Name: Alexander Alexander MRN: [id number] DOB: 24-Dec-1970 Today's Date: 05/25/2018   History of Present Illness  Pt. is a 47 y.o. male with PMH of atrial fibrillation, status post ablation and aortic valve replacement on long term warfarin, CHF, CKD, diabetes, obstructive sleep apnea and hypertension. Pt. had a pulseless VT cardiac arrest in MRI when he was being evaluated for back pain and sciatica, subsequently intubated in the intensive care unit. Intubated from 5/31-6/18 when trach placed. Pt recently transferred out of ICU. Pt evaluated for DVT of UE however MD notes states abnormality is believed to not be foreign body    Clinical Impression  Pt is a pleasant 47 year old male who was admitted for a pulseless VT cardiac arrest. After lengthy stay in CCU pt had transferred to floor for short period prior to being readmitted to CCU due to pt self decanulating trach. New order received today as pt has transferred back to floor without trach and is tolerating O2 via [place] well. Pt performs bed mobility, transfers, and ambulation with CGA. Pt demonstrates deficits with strength, endurance, and mobility.  Pt amb 40' with one seated rest break half way. Pt is very eager to get back home and expresses good home support. Pts vitals monitored throughout session, O2 saturation 99% at rest, decreased to low 90s with exertion however quickly recovered. Pt on 3L of O2 throughout session via . Pt demonstrates full cervical rotation and ability to transfer attention to left which is drastic improvement from previous sessions. Pt demonstrates intact cognition and no neuro deficits upon neuro screening. Pt does demonstrate slight resting tremor in R UE periodically throughout session stating that it is his "nerves." Pt could benefit from continued skilled therapy at this time to improve deficits toward PLOF. PT will continue to work with pt at least 2x/week while admitted. D/c  recommendations at this time are home with home health PT.      Follow Up Recommendations Home health PT    Equipment Recommendations  Rolling walker with 5" wheels;Wheelchair (measurements PT)(Barriatric RW and wheelchair)    Recommendations for Other Services       Precautions / Restrictions Precautions Precautions: Fall Restrictions Weight Bearing Restrictions: No      Mobility  Bed Mobility Overal bed mobility: Modified Independent Bed Mobility: Supine to Sit     Supine to sit: Modified independent (Device/Increase time)     General bed mobility comments: pt mod I for increased time and UE support to perform bed mobility  Transfers Overall transfer level: Needs assistance Equipment used: Rolling walker (2 wheeled) Transfers: Sit to/from Stand Sit to Stand: Min guard         General transfer comment: pt CGA for sit<>stand transfers. Pt demonstrates safe technique and appropriate UE use to perform. Able to maintain upright posture with CGA.  Ambulation/Gait Ambulation/Gait assistance: Min guard;+2 physical assistance Gait Distance (Feet): 40 Feet Assistive device: Rolling walker (2 wheeled) Gait Pattern/deviations: Step-through pattern;Decreased step length - left;Decreased step length - right     General Gait Details: Pt amb 40' with a seated rest break half way with CGA +2. Pt demonstrates safe use of RW and no gross LOB. pt states that his hips feel weak during gait. Pt fatigued during gait.  Stairs            Wheelchair Mobility    Modified Rankin (Stroke Patients Only)       Balance Overall balance assessment: Needs assistance  Sitting balance-Leahy Scale: Good Sitting balance - Comments: pt displays safe sitting blance EOB with good postural control. feet flat on floor without UE support, no gross LOB     Standing balance-Leahy Scale: Fair Standing balance comment: pt able to maintain upright posture with B UE on RW and CGA without  gross LOB                             Pertinent Vitals/Pain Pain Assessment: 0-10 Pain Score: 6  Pain Location: sciatica RLE, R shld Pain Descriptors / Indicators: Grimacing;Discomfort Pain Intervention(s): Limited activity within patient's tolerance;Monitored during session    Home Living Family/patient expects to be discharged to:: Private residence Living Arrangements: Spouse/significant other Available Help at Discharge: Family Type of Home: Other(Comment)(townhome) Home Access: Level entry     Home Layout: Two level;Able to live on main level with bedroom/bathroom Home Equipment: [place] - single point      Prior Function Level of Independence: Independent with assistive device(s)         Comments: Per wife, pt was previously independent with all ADLs, however used SPC for long distances     Hand Dominance        Extremity/Trunk Assessment   Upper Extremity Assessment Upper Extremity Assessment: Overall WFL for tasks assessed(Limited R shld strength due to pain)    Lower Extremity Assessment Lower Extremity Assessment: Overall WFL for tasks assessed(Grossly 4+/5 bilaterally; sensation WNL)    Cervical / Trunk Assessment Cervical / Trunk Assessment: Normal(WNL cervical rotation bilaterally)  Communication   Communication: No difficulties  Cognition Arousal/Alertness: Awake/alert Behavior During Therapy: WFL for tasks assessed/performed Overall Cognitive Status: Within Functional Limits for tasks assessed                                 General Comments: pt alert and following commands. Able to speak at this time and answer all questions appropriately      General Comments      Exercises Other Exercises Other Exercises: B LE ther-ex including LAQ, seated marching, standing marching x15, pt tolerated treatment well.   Assessment/Plan    PT Assessment Patient needs continued PT services  PT Problem List Decreased  strength;Decreased activity tolerance;Decreased balance;Decreased mobility;Decreased cognition;Cardiopulmonary status limiting activity;Obesity       PT Treatment Interventions      PT Goals (Current goals can be found in the Care Plan section)  Acute Rehab PT Goals Patient Stated Goal: to go home PT Goal Formulation: With patient Time For Goal Achievement: 06/08/18 Potential to Achieve Goals: Fair    Frequency Pt to be seen a minimum of 2x/week while admitted to address DME instruction, gait training, therapeutic exercise, balance training, patient/family education.   Barriers to discharge        Co-evaluation               AM-PAC PT "6 Clicks" Daily Activity  Outcome Measure Difficulty turning over in bed (including adjusting bedclothes, sheets and blankets)?: A Little Difficulty moving from lying on back to sitting on the side of the bed? : A Little Difficulty sitting down on and standing up from a chair with arms (e.g., wheelchair, bedside commode, etc,.)?: Unable Help needed moving to and from a bed to chair (including a wheelchair)?: A Little Help needed walking in hospital room?: A Little Help needed climbing 3-5 steps with a railing? : A  Lot 6 Click Score: 15    End of Session Equipment Utilized During Treatment: Gait belt Activity Tolerance: Patient tolerated treatment well Patient left: in chair;with call bell/phone within reach;with bed alarm set;with family/visitor present   PT Visit Diagnosis: Muscle weakness (generalized) (M62.81);Difficulty in walking, not elsewhere classified (R26.2);Other symptoms and signs involving the nervous system (R29.898)    Time: [id number] PT Time Calculation (min) (ACUTE ONLY): 30 min   Charges:         PT G Codes:        Alister Staver, SPT   Zarriah Starkel 05/25/2018, 12:37 PM

## 2018-05-25 NOTE — Plan of Care (Signed)
  Problem: Health Behavior/Discharge Planning: Goal: Ability to manage health-related needs will improve Outcome: Progressing   Problem: Clinical Measurements: Goal: Will remain free from infection Outcome: Progressing   Problem: Coping: Goal: Level of anxiety will decrease Outcome: Progressing   

## 2018-05-25 NOTE — Evaluation (Signed)
Occupational Therapy Evaluation Patient Details Name: Alexander RiffleOtis Leon Blanchfield MRN: [id number] DOB: February 14, 1971 Today's Date: 05/25/2018    History of Present Illness Pt. is a 47 y.o. male with PMH of atrial fibrillation, status post ablation and aortic valve replacement on long term warfarin, CHF, CKD, diabetes, obstructive sleep apnea and hypertension. Pt. had a pulseless VT cardiac arrest in MRI when he was being evaluated for back pain and sciatica, subsequently intubated in the intensive care unit. Intubated from 5/31-6/18 when trach placed. Pt recently transferred out of ICU. Pt evaluated for DVT of UE however MD notes states abnormality is believed to not be foreign body   Clinical Impression   Pt is a pleasant 47 year old male who was admitted for a pulseless VT cardiac arrest. After lengthy stay in CCU pt had transferred to floor for short period prior to being readmitted to CCU due to pt self decanulating trach. New order received today as pt has transferred back to floor without trach and is tolerating O2 via [place] well. Pt seen for OT re-evaluation this date. Pt currently requires minimal assist for LB ADL and CGA with RW for mobility due to poor activity tolerance, impaired balance, and significant sciatica and R shoulder pain (prior to hospitalization). Pt sat EOB with supervision for >23 minutes for education/training in energy conservation conservation strategies including pursed lip breathing, activity pacing, home/routines modifications, work simplification, AE/DME, prioritizing of meaningful occupations, and falls prevention. Handout provided. Pt also instructed in cognitive behavioral strategies for improved self mgt of chronic pain. Pt verbalized understanding. Pt would benefit from additional skilled OT services to maximize recall and carryover of learned techniques and facilitate implementation of learned techniques into daily routines. Pt has made excellent progress from the time of the initial  evaluation, therefore, recommending HHOT services and family support upon hospital discharge.    Follow Up Recommendations  Home health OT    Equipment Recommendations  Tub/shower bench;3 in 1 bedside commode(bariatric 3:1 and bariatric TTB)    Recommendations for Other Services       Precautions / Restrictions Precautions Precautions: Fall Restrictions Weight Bearing Restrictions: No      Mobility Bed Mobility Overal bed mobility: Modified Independent Bed Mobility: Supine to Sit     Supine to sit: Modified independent (Device/Increase time)     General bed mobility comments: increased time/effort to perform but able to do without physical assist  Transfers Overall transfer level: Needs assistance Equipment used: Rolling walker (2 wheeled) Transfers: Sit to/from Stand Sit to Stand: Min guard              Balance Overall balance assessment: Needs assistance Sitting-balance support: No upper extremity supported;Feet supported Sitting balance-Leahy Scale: Good     Standing balance support: Bilateral upper extremity supported Standing balance-Leahy Scale: Fair                             ADL either performed or assessed with clinical judgement   ADL Overall ADL's : Needs assistance/impaired     Grooming: Sitting;Independent   Upper Body Bathing: Sitting;Supervision/ safety   Lower Body Bathing: Sit to/from stand;Minimal assistance Lower Body Bathing Details (indicate cue type and reason): encouraged to take seated sponge bath initially upon return home Upper Body Dressing : Sitting;Supervision/safety   Lower Body Dressing: Sit to/from stand;Minimal assistance Lower Body Dressing Details (indicate cue type and reason): instructed in body positioning strategies and AE for LB dressing to minimize  need to bend over and improve independence Toilet Transfer: Requires wide/bariatric;BSC;Ambulation;RW Toilet Transfer Details (indicate cue type and  reason): educated pt in use of bari BSC over toilet at home to improve transfers safety       Tub/Shower Transfer Details (indicate cue type and reason): educated in use of bariatric tub transfer bench for tub transfers/seated bathing at home to support energy conservation and falls prevention while activity tolerance is low         Vision Baseline Vision/History: Wears glasses Wears Glasses: At all times Patient Visual Report: No change from baseline       Perception     Praxis      Pertinent Vitals/Pain Pain Assessment: 0-10 Pain Score: 9  Pain Location: sciatica RLE, R shld Pain Descriptors / Indicators: Grimacing;Discomfort;Aching Pain Intervention(s): Limited activity within patient's tolerance;Monitored during session;Repositioned     Hand Dominance Right   Extremity/Trunk Assessment Upper Extremity Assessment Upper Extremity Assessment: Overall WFL for tasks assessed(Limited R shld strength due to pain)   Lower Extremity Assessment Lower Extremity Assessment: Overall WFL for tasks assessed;Defer to PT evaluation(Grossly 4+/5 bilaterally; sensation WNL)   Cervical / Trunk Assessment Cervical / Trunk Assessment: Normal(WNL cervical rotation bilaterally)   Communication Communication Communication: No difficulties   Cognition Arousal/Alertness: Awake/alert Behavior During Therapy: WFL for tasks assessed/performed Overall Cognitive Status: Within Functional Limits for tasks assessed                                     General Comments       Exercises Other Exercises Other Exercises: Pt sat EOB during instructed for energy conservation strategies, falls prevention, home/routines modifications with handout provided to maximize safety and independence while he is building his activty tolerance back up Other Exercises: Pt educated in cognitive behavioral pain coping strategies to support improved pain mgt including distraction, deep breathing, and  pleasant imagery.   Shoulder Instructions      Home Living Family/patient expects to be discharged to:: Private residence Living Arrangements: Spouse/significant other Available Help at Discharge: Family Type of Home: Other(Comment)(townhome) Home Access: Level entry     Home Layout: Two level;Able to live on main level with bedroom/bathroom     Bathroom Shower/Tub: Curtain;Tub/shower unit         Home Equipment: Cane - single point          Prior Functioning/Environment Level of Independence: Independent with assistive device(s)        Comments: Per wife, pt was previously independent with all ADLs, however used SPC for long distances        OT Problem List: Decreased knowledge of use of DME or AE;Decreased activity tolerance;Impaired balance (sitting and/or standing);Pain;Cardiopulmonary status limiting activity      OT Treatment/Interventions: Self-care/ADL training;Balance training;Therapeutic exercise;Therapeutic activities;Energy conservation;DME and/or AE instruction;Patient/family education    OT Goals(Current goals can be found in the care plan section) Acute Rehab OT Goals Patient Stated Goal: to go home OT Goal Formulation: With patient Time For Goal Achievement: 06/08/18 Potential to Achieve Goals: Good  OT Frequency: Min 2X/week   Barriers to D/C:            Co-evaluation              AM-PAC PT "6 Clicks" Daily Activity     Outcome Measure Help from another person eating meals?: None Help from another person taking care of personal grooming?: None Help  from another person toileting, which includes using toliet, bedpan, or urinal?: A Little Help from another person bathing (including washing, rinsing, drying)?: A Little Help from another person to put on and taking off regular upper body clothing?: None Help from another person to put on and taking off regular lower body clothing?: A Little 6 Click Score: 21   End of Session     Activity Tolerance: Patient tolerated treatment well Patient left: in bed;with call bell/phone within reach;with family/visitor present(seated EOB to eat dinner with spouse)  OT Visit Diagnosis: Muscle weakness (generalized) (M62.81);Pain Pain - Right/Left: Right(both/sciatica) Pain - part of body: Hip                Time: 1610-9604 OT Time Calculation (min): 37 min Charges:  OT General Charges $OT Visit: 1 Visit OT Evaluation $OT Re-eval: 1 Re-eval OT Treatments $Self Care/Home Management : 23-37 mins  Richrd Prime, MPH, MS, OTR/L ascom [phone] 05/25/18, 5:17 PM

## 2018-05-25 NOTE — Progress Notes (Signed)
Stoma care done, stoma draining secretions, site cleaned with sterile water and clean dry dressing applied.

## 2018-05-26 ENCOUNTER — Telehealth: Payer: Self-pay | Admitting: Family Medicine

## 2018-05-26 LAB — GLUCOSE, CAPILLARY
GLUCOSE-CAPILLARY: 115 mg/dL — AB (ref 70–99)
Glucose-Capillary: 115 mg/dL — ABNORMAL HIGH (ref 70–99)
Glucose-Capillary: 131 mg/dL — ABNORMAL HIGH (ref 70–99)

## 2018-05-26 LAB — BASIC METABOLIC PANEL
Anion gap: 8 (ref 5–15)
BUN: 8 mg/dL (ref 6–20)
CO2: 32 mmol/L (ref 22–32)
CREATININE: 2.08 mg/dL — AB (ref 0.61–1.24)
Calcium: 8.6 mg/dL — ABNORMAL LOW (ref 8.9–10.3)
Chloride: 99 mmol/L (ref 98–111)
GFR calc Af Amer: 41 mL/min — ABNORMAL LOW (ref >60)
GFR, EST NON AFRICAN AMERICAN: 36 mL/min — AB (ref >60)
Glucose, Bld: 126 mg/dL — ABNORMAL HIGH (ref 70–99)
Potassium: 3.6 mmol/L (ref 3.5–5.1)
SODIUM: 139 mmol/L (ref 135–145)

## 2018-05-26 LAB — PROTIME-INR
INR: 1.37
PROTHROMBIN TIME: 16.8 s — AB (ref 11.4–15.2)

## 2018-05-26 MED ORDER — ONDANSETRON HCL 4 MG PO TABS: 4.0000 mg | ORAL_TABLET | Freq: Every day | ORAL | 1 refills | Status: AC | PRN

## 2018-05-26 MED ORDER — INSULIN DETEMIR 100 UNIT/ML ~~LOC~~ SOLN: 12.0000 [IU] | Freq: Two times a day (BID) | SUBCUTANEOUS | 5 refills | Status: DC

## 2018-05-26 MED ORDER — ENOXAPARIN SODIUM 150 MG/ML ~~LOC~~ SOLN: 150.0000 mg | Freq: Two times a day (BID) | SUBCUTANEOUS | 0 refills | Status: DC

## 2018-05-26 MED ORDER — WARFARIN SODIUM 7.5 MG PO TABS: 7.5000 mg | ORAL_TABLET | Freq: Once | ORAL | Status: DC

## 2018-05-26 MED ORDER — WARFARIN SODIUM 7.5 MG PO TABS
7.5000 mg | ORAL_TABLET | Freq: Once | ORAL | Status: DC
  Filled 2018-05-26: qty 1

## 2018-05-26 MED ORDER — OXYCODONE-ACETAMINOPHEN 5-325 MG PO TABS: 1.0000 | ORAL_TABLET | Freq: Three times a day (TID) | ORAL | 0 refills | Status: AC | PRN

## 2018-05-26 NOTE — Care Management Important Message (Signed)
Important Message  Patient Details  Name: Alexander Alexander MRN: [id number] Date of Birth: May 15, 1971   Medicare Important Message Given:  Yes    Chapman FitchBOWEN, Makeyla Govan T, RN 05/26/2018, 2:20 PM

## 2018-05-26 NOTE — Progress Notes (Signed)
Physical Therapy Treatment Patient Details Name: Alexander Alexander Leon Beveridge MRN: [id number] DOB: [birth year] Today's Date: 05/26/2018    History of Present Illness Pt. is a 47 y.o. male with PMH of atrial fibrillation, status post ablation and aortic valve replacement on long term warfarin, CHF, CKD, diabetes, obstructive sleep apnea and hypertension. Pt. had a pulseless VT cardiac arrest in MRI when he was being evaluated for back pain and sciatica, subsequently intubated in the intensive care unit. Intubated from 5/31-6/18 when trach placed. Pt recently transferred out of ICU. Pt evaluated for DVT of UE however MD notes states abnormality is believed to not be foreign body    PT Comments    Pt seen this afternoon and is very eager to go home. Pt states that he has a small amount of anxiety however feels ready and believes he will be safe at home. Pt instructed in seated and standing there-ex which he tolerates well however fatigues quickly. Pt amb 90' total with 2 seated rest breaks throughout. Pt remained on 3L of O2 via [place] throughout session and saturation remained WNL even with exertion. Pt states that prior to admission he was on continuous O2 in the home and feels confident he can manage it in home. Pt educated about stairs in the home and was encouraged to remain on first floor until home health PT able to assist with stairs however pt was educated on safe techniques to do stairs if he feels he needs to in the the home. At this time pt is limited mostly due to fatigue. Pt could benefit from continued skilled therapy at this time to improve deficits toward PLOF. PT will continue to work with pt at least 2x/week while admitted. D/c recommendations continue to be home with home health PT. Pt very thankful and appreciative of PT team for working with him throughout admission.  Patient suffers from CHF, CKD, and DM which impairs his ability to perform daily activities like toileting, feeding, dressing,  grooming, and bathing in the home. A walker will not resolve the patient's issue with performing activities of daily living. A bariatric wheelchair is required/recommended and will allow patient to safely perform daily activities.   Patient can safely propel the wheelchair in the home and has a caregiver who can provide assistance.     Follow Up Recommendations  Home health PT     Equipment Recommendations  Rolling walker with 5" wheels;Wheelchair (measurements PT)(barriatric)    Recommendations for Other Services       Precautions / Restrictions Precautions Precautions: Fall Restrictions Weight Bearing Restrictions: No    Mobility  Bed Mobility Overal bed mobility: Independent Bed Mobility: Sit to Supine       Sit to supine: Supervision   General bed mobility comments: pt performs sit>supine safely in an appropriate amount of time and with appropriate amount of UE support/effort  Transfers Overall transfer level: Modified independent Equipment used: Rolling walker (2 wheeled) Transfers: Sit to/from Stand Sit to Stand: Modified independent (Device/Increase time)         General transfer comment: pt mod I for sit<>stand transfers, performing safely with appropriate use of UEs and RW safely.  Ambulation/Gait Ambulation/Gait assistance: Min guard Gait Distance (Feet): 90 Feet Assistive device: Rolling walker (2 wheeled) Gait Pattern/deviations: Step-through pattern;Decreased step length - left;Decreased step length - right     General Gait Details: pt amb 2190' with CGA and RW. Pt demonstrates safe use of RW and safe ambulation. Pt has no gross LOB during gait  however continues to fatigue quickly. Pt required to seated rest breaks after the inital 40' and after another 40'.   Stairs             Wheelchair Mobility    Modified Rankin (Stroke Patients Only)       Balance                                            Cognition  Arousal/Alertness: Awake/alert Behavior During Therapy: WFL for tasks assessed/performed Overall Cognitive Status: Within Functional Limits for tasks assessed                                 General Comments: pt alert and following commands. Able to speak at this time and answer all questions appropriately      Exercises Other Exercises Other Exercises: pt instructed in seated marching exercises 2x25 reps on each leg. Pt also instructed in steping exercises to simulate stairs x10 reps each leg.    General Comments        Pertinent Vitals/Pain Pain Assessment: 0-10 Pain Score: 4  Pain Location: sciatica RLE, R shld Pain Descriptors / Indicators: Grimacing;Discomfort;Aching Pain Intervention(s): Limited activity within patient's tolerance;Monitored during session    Home Living                      Prior Function            PT Goals (current goals can now be found in the care plan section) Acute Rehab PT Goals Patient Stated Goal: to go home PT Goal Formulation: With patient Time For Goal Achievement: 06/08/18 Potential to Achieve Goals: Fair Progress towards PT goals: Progressing toward goals    Frequency    Min 2X/week      PT Plan Current plan remains appropriate    Co-evaluation              AM-PAC PT "6 Clicks" Daily Activity  Outcome Measure  Difficulty turning over in bed (including adjusting bedclothes, sheets and blankets)?: None Difficulty moving from lying on back to sitting on the side of the bed? : None Difficulty sitting down on and standing up from a chair with arms (e.g., wheelchair, bedside commode, etc,.)?: None Help needed moving to and from a bed to chair (including a wheelchair)?: A Little Help needed walking in hospital room?: A Little Help needed climbing 3-5 steps with a railing? : A Little 6 Click Score: 21    End of Session Equipment Utilized During Treatment: Gait belt;Oxygen Activity Tolerance:  Patient tolerated treatment well Patient left: in bed;with call bell/phone within reach;with family/visitor present   PT Visit Diagnosis: Muscle weakness (generalized) (M62.81);Difficulty in walking, not elsewhere classified (R26.2);Other symptoms and signs involving the nervous system (R29.898)     Time: 1610-9604 PT Time Calculation (min) (ACUTE ONLY): 32 min  Charges:                       G Codes:       Margarine Grosshans Elnora Morrison, SPT    Brance Dartt 05/26/2018, 10:44 AM

## 2018-05-26 NOTE — Progress Notes (Signed)
Patient has no acute event overnight remained in controlled asymptomatic afib in the 90s, VSS, wife at the bedside. Patient refused bed alarm, patient and family were educated about the importance of bed alarm.

## 2018-05-26 NOTE — Clinical Social Work Note (Signed)
CSW received referral for SNF.  Case discussed with case manager and plan is to discharge home with home health.  CSW to sign off please re-consult if social work needs arise.  Eowyn Tabone R. Dene Landsberg, MSW, LCSWA [phone]  

## 2018-05-26 NOTE — Progress Notes (Signed)
ANTICOAGULATION CONSULT NOTE   Pharmacy Consult for warfarin with a Lovenox bridge Indication: atrial fibrillation and a mechanical aortic valve replacement  Allergies  Allergen Reactions  . Lexapro [Escitalopram Oxalate] Swelling    Patient Measurements: Height: 5\' 11"  (180.3 cm) Weight: (!) 342 lb 6.4 oz (155.3 kg) IBW/kg (Calculated) : 75.3  Vital Signs: Temp: 98.5 F (36.9 C) (07/19 0557) Temp Source: Oral (07/19 0557) BP: 132/91 (07/19 0557) Pulse Rate: 81 (07/19 0557)  Labs: Recent Labs    05/23/18 1943 05/24/18 0334 05/24/18 0542 05/25/18 0524 05/26/18 0427  HGB  --  11.4*  --   --   --   HCT  --  34.2*  --   --   --   PLT  --  224  --   --   --   LABPROT  --   --   --  16.5* 16.8*  INR  --   --   --  1.34 1.37  HEPARINUNFRC 0.52  --  0.62 0.56  --   CREATININE  --  2.01*  --   --  2.08*    Estimated Creatinine Clearance: 65.2 mL/min (A) (by C-G formula based on SCr of 2.08 mg/dL (H)).   Medical History: Past Medical History:  Diagnosis Date  . Allergy   . Anxiety   . Arrhythmia   . Arthritis   . Atrial fibrillation (HCC)   . CHF (congestive heart failure) (HCC)   . CKD (chronic kidney disease)   . Diabetes mellitus without complication (HCC)   . Diabetes mellitus, type II (HCC)   . Heart failure (HCC)   . Hyperlipidemia   . Hypertension   . Pancreatitis   . Sleep apnea     Assessment: 47 year old male with past medical history significant for A. fib status post ablation, aortic valve replacement on warfarin, history of congestive heart failure, CKD, morbid obesity, diabetes and sleep apnea who was getting an outpatient MRI of his lower back had a cardiac arrest. He has been on a heparin drip until today and warfarin was re-started yesterday (7/17). Today we are asked to switch from heparin to Lovenox until INR is therapeutic. His home dose of warfarin is 5mg  daily.   INR  Dose 7/17 1.27  5 mg 7/18 1.34  7.5 mg 7/19 1.37  7.5mg   Goal of  Therapy:  INR 2-3 Monitor platelets by anticoagulation protocol: Yes   Plan:  INR subtherapeutic at 1.37. Will give another 7.5mg  warfarin dose this PM and recheck INR tomorrow AM. Continue to bridge with Lovenox. Will order CBC tomorrow AM per protocol for LMWH monitoring.   Carolynne EdouardHannah C Lifsey, PharmD 05/26/2018,8:53 AM

## 2018-05-26 NOTE — Discharge Instructions (Signed)

## 2018-05-26 NOTE — Telephone Encounter (Signed)
Will you be able to do TOC call? Thanks!  Erasmo DownerBacigalupo, Angela M, MD, MPH [facility] 05/26/2018 2:28 PM

## 2018-05-26 NOTE — Discharge Summary (Signed)
[employer] [facility] [place] [facility], [place] y.o., DOB Mar 25, 1971, MRN [id number]. Admission date: 04/07/2018 Discharge Date 05/26/2018 Primary MD Alexander Downer, MD Admitting Physician Alexander Manis, MD  Admission Diagnosis  Cardiac arrest (HCC) [I46.9] V tach (HCC) [I47.2] Endotracheally intubated [Z97.8]  Discharge Diagnosis   Principal Problem:  Cardiac arrest [facility]) due to pulseless VT Acute MRSA ventilated associated pneumonia status post treatment A. fib with RVR obstructive sleep apnea HTN (hypertension) T2DM (type 2 diabetes mellitus) (HCC) Aortic valve replaced Atrial fibrillation (HCC) OSA (obstructive sleep apnea) CKD (chronic kidney disease) stage 3, GFR 30-59 ml/min (HCC) Hyperlipidemia associated  Acute toxic metabolic encephalopathy Acute renal failure with chronic kidney disease stage III Back pain      Hospital Course  Patient is a [name] who initially was seen in the emergency room for back pain.  And was ordered to have MRI while in the MRI patient had a V. fib arrest.  Requiring intubation and admission to the ICU.  Patient had a prolonged admission in the ICU requiring ventilator trach.  There was an attempt at [facility] however that was unsuccessful.  Patient had a echocardiogram which showed normal ejection fraction.  He was followed by cardiology GI and intensivist.  During the hospitalization he also developed acute MRSA ventilator associated pneumonia which was treated with antibiotics.  Patient also developed A. fib with RVR.  He is on chronic anticoagulation.  His INR is subtherapeutic therefore he is being discharged on full dose Lovenox with Coumadin bridging.  Patient will need to have his INR checked and results sent to his primary care provider who can adjust his Coumadin and continue Lovenox if needed.  Family's request he is also referred to neurosurgery for his back discomfort.  Patient did  have a trach collar that came out and now he is on oxygen therapy.  He is tolerating this well.         Consults  cardiology, gi , intesivist  Significant Tests:  See full reports for all details     Ct Abdomen Wo Contrast  Result Date: 05/01/2018 CLINICAL DATA:  47 y/o  M; evaluate for PEG tube placement. EXAM: CT ABDOMEN WITHOUT CONTRAST TECHNIQUE: Multidetector CT imaging of the abdomen was performed following the standard protocol without IV contrast. COMPARISON:  None. FINDINGS: Lower chest: Cardiomegaly, platelike atelectasis in the lung bases, mitral annular calcification. Hepatobiliary: No focal liver abnormality is seen. No gallstones, gallbladder wall thickening, or biliary dilatation. Pancreas: Unremarkable. No pancreatic ductal dilatation or surrounding inflammatory changes. Spleen: Normal in size without focal abnormality. Adrenals/Urinary Tract: Adrenal glands are unremarkable. Kidneys are normal, without renal calculi, focal lesion, or hydronephrosis. Stomach/Bowel: Stomach is within normal limits. Appendix appears normal. No evidence of bowel wall thickening, distention, or inflammatory changes. The left lobe of the liver extends inferiorly overlapping the majority of the gastric body. There is no colon super position over the stomach at this time. There is a very small area of uncovered stomach in the left anterior axillary line below the ribcage (series 2, image 32). Vascular/Lymphatic: No significant vascular findings are present. No enlarged abdominal or pelvic lymph nodes. Other: No abdominal wall hernia or abnormality. Musculoskeletal: No acute or significant osseous findings. IMPRESSION: The left lobe of the liver extends inferiorly overlapping the majority of the gastric body. There is no colon superficial to the stomach at this time. There is a very small area of uncovered stomach in the left anterior axillary  line below the ribcage. Electronically Signed   By: Mitzi Hansen M.D.   On: 05/01/2018 23:14   Dg Chest 1 View  Result Date: 05/24/2018 CLINICAL DATA:  Shortness of breath EXAM: CHEST  1 VIEW COMPARISON:  05/23/2018 FINDINGS: Interval removal of right PICC line. Left PICC line tip in the SVC. Cardiomegaly. Prior median sternotomy and valve replacement. Vascular congestion and bibasilar atelectasis. IMPRESSION: Increasing vascular congestion and bibasilar atelectasis. Electronically Signed   By: Charlett Nose M.D.   On: 05/24/2018 10:11   Dg Shoulder 1v Right  Result Date: 05/24/2018 CLINICAL DATA:  Right shoulder pain. EXAM: RIGHT SHOULDER - 1 VIEW COMPARISON:  None. FINDINGS: There is no evidence of fracture or dislocation. There is no evidence of arthropathy or other focal bone abnormality. Soft tissues are unremarkable. IMPRESSION: No acute abnormality on this single view of the right shoulder. Electronically Signed   By: Marin Roberts M.D.   On: 05/24/2018 12:10   Dg Abd 1 View  Result Date: 05/18/2018 CLINICAL DATA:  Feeding tube placement at bedside. EXAM: ABDOMEN - 1 VIEW COMPARISON:  05/16/2018, 05/04/2018 and earlier, including CT abdomen and pelvis 05/01/2018. FINDINGS: Feeding tube tip in the fundus of the stomach. Visualized bowel gas pattern nonobstructive. IMPRESSION: Feeding tube tip in the fundus of the stomach. Electronically Signed   By: Hulan Saas M.D.   On: 05/18/2018 11:42   Dg Abd 1 View  Result Date: 05/16/2018 CLINICAL DATA:  Evaluate feeding tube placement. EXAM: ABDOMEN - 1 VIEW COMPARISON:  None. FINDINGS: A feeding tube terminates in the left upper quadrant, likely in the body of the stomach. IMPRESSION: The feeding tube appears to terminate in the body of the stomach. Electronically Signed   By: Gerome Sam III M.D   On: 05/16/2018 19:22   Dg Abd 1 View  Result Date: 05/04/2018 CLINICAL DATA:  Preop for gastrostomy tube placement EXAM: ABDOMEN - 1 VIEW COMPARISON:  Abdominal CT from 3 days ago  FINDINGS: Nasogastric tube tip over the stomach. Normal bowel gas pattern. Low volume chest with streaky opacities. IMPRESSION: Nasogastric tube with tip over the stomach. Limited abdominal coverage with no dilated bowel seen. Electronically Signed   By: Marnee Spring M.D.   On: 05/04/2018 12:27   Dg Abd 1 View  Result Date: 04/29/2018 CLINICAL DATA:  47 y/o  M; enteric tube placement. EXAM: ABDOMEN - 1 VIEW COMPARISON:  04/25/2018 abdomen radiograph. FINDINGS: Enteric tube tip projects over the gastric body. Post median sternotomy with broken lower wire. Left upper abdomen bowel gases travel. IMPRESSION: Enteric tube tip projects over gastric body. Electronically Signed   By: Mitzi Hansen M.D.   On: 04/29/2018 16:28   Ir Gastrostomy Tube Mod Sed  Result Date: 05/04/2018 INDICATION: 47 year old with multiple medical problems including history of acute cardiopulmonary arrest. Patient has a tracheostomy tube and request for gastrostomy tube. GI was unable to safely place a gastrostomy tube with endoscopy. Patient had an abdominal CT that suggested there may be a safe percutaneous window for gastrostomy tube placement. Informed consent was obtained from the patient's wife. EXAM: FLUOROSCOPY TIME FOR GASTROSTOMY TUBE EVALUATION Physician: Rachelle Hora. Henn, MD MEDICATIONS: None ANESTHESIA/SEDATION: None FLUOROSCOPY TIME:  Fluoroscopy Time: 3 minutes and 48 seconds, 650 mGy COMPLICATIONS: None immediate. PROCEDURE: The procedure was explained to the patient. The risks and benefits of the procedure were discussed and the patient' s white questions were addressed. Informed consent was obtained from the patient's wife. Patient was placed supine  on the interventional table. The stomach was insufflated with gas through the nasogastric tube. The colon is gas-filled and well visualized. However, there appeared to be redundant loops of colon in the splenic flexure that were projecting over the stomach. Due to  patient's morbid obesity, it was difficult to get a true lateral view to assess the exact position of the colon. Based on the fluoroscopic evaluation, a safe percutaneous window could not be identified. Therefore, gastrostomy tube was not attempted. IMPRESSION: Percutaneous gastrostomy tube was not attempted because a safe percutaneous window was not identified. Consider surgical consultation. Electronically Signed   By: Richarda OverlieAdam  Henn M.D.   On: 05/04/2018 15:40   [place] Venous Img Lower Bilateral  Result Date: 04/28/2018 CLINICAL DATA:  Fever, lower extremity edema EXAM: BILATERAL LOWER EXTREMITY VENOUS DOPPLER ULTRASOUND TECHNIQUE: Gray-scale sonography with graded compression, as well as color Doppler and duplex ultrasound were performed to evaluate the lower extremity deep venous systems from the level of the common femoral vein and including the common femoral, femoral, profunda femoral, popliteal and calf veins including the posterior tibial, peroneal and gastrocnemius veins when visible. The superficial great saphenous vein was also interrogated. Spectral Doppler was utilized to evaluate flow at rest and with distal augmentation maneuvers in the common femoral, femoral and popliteal veins. COMPARISON:  None. FINDINGS: Limited because of body habitus. RIGHT LOWER EXTREMITY Common Femoral Vein: No evidence of thrombus. Normal compressibility, respiratory phasicity and response to augmentation. Saphenofemoral Junction: No evidence of thrombus. Normal compressibility and flow on color Doppler imaging. Profunda Femoral Vein: No evidence of thrombus. Normal compressibility and flow on color Doppler imaging. Femoral Vein: No evidence of thrombus. Normal compressibility, respiratory phasicity and response to augmentation. Popliteal Vein: No evidence of thrombus. Normal compressibility, respiratory phasicity and response to augmentation. Calf Veins: Limited visualization.  No gross occlusive thrombus. Superficial Great  Saphenous Vein: No evidence of thrombus. Normal compressibility. Venous Reflux:  None. Other Findings:  None. LEFT LOWER EXTREMITY Common Femoral Vein: No evidence of thrombus. Normal compressibility, respiratory phasicity and response to augmentation. Saphenofemoral Junction: No evidence of thrombus. Normal compressibility and flow on color Doppler imaging. Profunda Femoral Vein: No evidence of thrombus. Normal compressibility and flow on color Doppler imaging. Femoral Vein: No evidence of thrombus. Normal compressibility, respiratory phasicity and response to augmentation. Popliteal Vein: No evidence of thrombus. Normal compressibility, respiratory phasicity and response to augmentation. Calf Veins: Limited exam. However, the left tibial veins appear patent. But the left peroneal veins are noncompressible with hypoechoic thrombus appearing occlusive. No propagation into the popliteal or femoral veins. Superficial Great Saphenous Vein: No evidence of thrombus. Normal compressibility. Venous Reflux:  None. Other Findings:  None. IMPRESSION: Limited exam because of body habitus. Positive exam for left calf peroneal DVT without propagation into the femoral or popliteal veins. Very low thrombus burden. No significant right lower extremity DVT. Electronically Signed   By: Judie PetitM.  Shick M.D.   On: 04/28/2018 16:02   [place] Venous Img Upper Bilat  Result Date: 05/24/2018 CLINICAL DATA:  Right upper extremity pain. Patient had recent right upper extremity PICC line and new left upper extremity PICC line placed. EXAM: BILATERAL UPPER EXTREMITY VENOUS DOPPLER ULTRASOUND TECHNIQUE: Gray-scale sonography with graded compression, as well as color Doppler and duplex ultrasound were performed to evaluate the bilateral upper extremity deep venous systems from the level of the subclavian vein and including the jugular, axillary, basilic, radial, ulnar and upper cephalic vein. Spectral Doppler was utilized to evaluate flow at rest and  with distal augmentation maneuvers. COMPARISON:  Chest radiograph-05/24/2018; 05/23/2018; right shoulder radiographs-05/24/2018 FINDINGS: RIGHT UPPER EXTREMITY Internal Jugular Vein: No evidence of thrombus. Normal compressibility, respiratory phasicity and response to augmentation. Subclavian Vein: No evidence of thrombus. Normal compressibility, respiratory phasicity and response to augmentation. Axillary Vein: No evidence of thrombus. Normal compressibility, respiratory phasicity and response to augmentation. Cephalic Vein: There is a linear nonocclusive structure within the right cephalic vein at the location of recently removed right upper extremity approach PICC line at the level the mid humerus (representative images 14 and 15). The cephalic vein appears patent both proximally and distally to this location. Note, this was confirmed with real-time sonographic evaluation performed by the dictating interventional radiologist. Basilic Vein: No evidence of thrombus. Normal compressibility, respiratory phasicity and response to augmentation. Brachial Veins: No evidence of thrombus. Normal compressibility, respiratory phasicity and response to augmentation. Radial Veins: No evidence of thrombus. Normal compressibility, respiratory phasicity and response to augmentation. Ulnar Veins: No evidence of thrombus. Normal compressibility, respiratory phasicity and response to augmentation. Venous Reflux:  None. Other Findings:  None. LEFT UPPER EXTREMITY Internal Jugular Vein: No evidence of thrombus. Normal compressibility, respiratory phasicity and response to augmentation. Subclavian Vein: No evidence of thrombus. Normal compressibility, respiratory phasicity and response to augmentation. Axillary Vein: No evidence of thrombus. Normal compressibility, respiratory phasicity and response to augmentation. Cephalic Vein: No evidence of thrombus. Normal compressibility, respiratory phasicity and response to augmentation.  Basilic Vein: No evidence of thrombus. Normal compressibility, respiratory phasicity and response to augmentation. Brachial Veins: No evidence of thrombus. Normal compressibility, respiratory phasicity and response to augmentation. Radial Veins: No evidence of thrombus. Normal compressibility, respiratory phasicity and response to augmentation. Ulnar Veins: No evidence of thrombus. Normal compressibility, respiratory phasicity and response to augmentation. Venous Reflux:  None. Other Findings:  None. IMPRESSION: 1. Nonocclusive linear echogenic structure within the right cephalic vein is favored to represent non occlusive superficial thrombophlebitis as sequela of recently removed right upper extremity approach PICC line placement. Further evaluation with dedicated right humerus radiographs could be performed to exclude the presence of a residual foreign body, however this is felt to be less likely based on real-time sonographic evaluation performed by the dictating interventional radiologist. 2. No evidence of DVT within the right upper extremity. 3. No evidence of DVT within the left upper extremity. Above findings discussed with Dr. Sherryll Burger at time of procedure completion. Electronically Signed   By: Simonne Come M.D.   On: 05/24/2018 17:38   Dg Chest Port 1 View  Result Date: 05/23/2018 CLINICAL DATA:  PICC placement EXAM: PORTABLE CHEST 1 VIEW COMPARISON:  05/23/2018 FINDINGS: Right arm PICC tip in the right axillary vein, unchanged from the prior study. New left arm PICC tip in the proximal SVC. Cardiac enlargement and CABG. Negative for heart failure. Left lower lobe atelectasis. IMPRESSION: Left arm PICC tip in the SVC No change in right arm PICC tip in the right axillary vein Left lower lobe atelectasis. Electronically Signed   By: Marlan Palau M.D.   On: 05/23/2018 18:24   Dg Chest Port 1 View  Result Date: 05/23/2018 CLINICAL DATA:  Displacement of peripherally inserted central catheter EXAM:  PORTABLE CHEST 1 VIEW COMPARISON:  Portable exam 1151 hours compared to 05/22/2018 FINDINGS: RIGHT arm PICC line has been partially withdrawn, tip now in RIGHT subclavian vein. Enlargement of cardiac silhouette post median sternotomy and at AVR. Slight pulmonary vascular congestion. Subsegmental atelectasis LEFT base, with minimal atelectasis dependently at RIGHT base. Upper lungs clear.  No pleural effusion or pneumothorax. IMPRESSION: Tip of RIGHT arm PICC line projects over the RIGHT subclavian vein, partially withdrawn since previous exam. Enlargement of cardiac silhouette with pulmonary vascular congestion post AVR. Bibasilar atelectasis greater on LEFT. Electronically Signed   By: Ulyses Southward M.D.   On: 05/23/2018 12:42   Dg Chest Port 1 View  Result Date: 05/22/2018 CLINICAL DATA:  Respiratory failure. EXAM: PORTABLE CHEST 1 VIEW COMPARISON:  05/11/2018 and 05/06/2018 FINDINGS: Tracheostomy tube appears in good position. PICC tip is at the level of the carina in the superior vena cava. Chronic cardiomegaly. Pulmonary vascularity is normal. Slight bibasilar atelectasis, slightly improved on the right and slightly increased on the left. IMPRESSION: Slight bibasilar atelectasis, improved on the right and slightly increased on the left. Electronically Signed   By: Francene Boyers M.D.   On: 05/22/2018 07:36   Dg Chest Port 1 View  Result Date: 05/11/2018 CLINICAL DATA:  Aspiration EXAM: PORTABLE CHEST 1 VIEW COMPARISON:  04/05/2018 FINDINGS: Cardiomegaly. Prior median sternotomy and valve replacement. Tracheostomy and NG tube are unchanged. Low lung volumes with vascular congestion. Interstitial prominence likely reflects early interstitial edema. Bibasilar atelectasis. IMPRESSION: Cardiomegaly with vascular congestion and probable early interstitial edema. Low lung volumes with bibasilar atelectasis. Electronically Signed   By: Charlett Nose M.D.   On: 05/11/2018 16:00   Dg Chest Port 1 View  Result  Date: 05/06/2018 CLINICAL DATA:  Acute respiratory failure. EXAM: PORTABLE CHEST 1 VIEW COMPARISON:  04/28/2018. FINDINGS: The tracheostomy tube remains in satisfactory position. Nasogastric tube in place, not clearly visible distally due to overlying density. Right PICC tip in the superior vena cava 4 cm above the superior cavoatrial junction. 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 No significant change in linear density in both lower lung zones and prominent pulmonary vasculature. Unremarkable bones. IMPRESSION: Stable cardiomegaly, pulmonary vascular congestion and bibasilar atelectasis. Electronically Signed   By: Beckie Salts M.D.   On: 05/06/2018 08:13   Dg Chest Port 1 View  Result Date: 04/28/2018 CLINICAL DATA:  Respiratory failure EXAM: PORTABLE CHEST 1 VIEW COMPARISON:  April 27, 2018 FINDINGS: Tracheostomy catheter tip is 5.1 cm above the carina. Nasogastric tube tip and side port are below the diaphragm. No pneumothorax. There is bibasilar atelectatic change. Lungs elsewhere clear. Heart is mildly enlarged with pulmonary vascularity normal. Patient is status post median sternotomy. No evident bone lesions. No appreciable adenopathy. IMPRESSION: Tube positions as described without pneumothorax. Bibasilar atelectasis. Stable cardiac prominence. Electronically Signed   By: Bretta Bang III M.D.   On: 04/28/2018 13:55   Dg Chest Port 1 View  Result Date: 04/27/2018 CLINICAL DATA:  Respiratory failure EXAM: PORTABLE CHEST 1 VIEW COMPARISON:  Two days ago FINDINGS: Tracheostomy tube in good position. An orogastric tube reaches the stomach. There is artifact from EKG leads. Right upper extremity PICC with tip at the upper cavoatrial junction. Very low lung volumes with diffuse hazy chest opacity. Asymmetric parenchymal opacity on the left. Cardiac enlargement. Prior median sternotomy. No evidence of pneumothorax. IMPRESSION: 1. Stable hardware positioning. 2. Low volume chest with  bilateral atelectasis or pneumonia and possible layering effusions. No convincing change from 2 days ago. Electronically Signed   By: Marnee Spring M.D.   On: 04/27/2018 07:36   Dg Abd Portable 1v  Result Date: 05/19/2018 CLINICAL DATA:  Nasogastric tube placement EXAM: PORTABLE ABDOMEN - 1 VIEW COMPARISON:  Portable exam 1253 hours compared to 05/18/2018 FINDINGS: Tip of feeding tube projects over the  mid stomach. Postsurgical changes of median sternotomy and valve replacement. Visualized bowel gas pattern normal. IMPRESSION: Tip of feeding tube projects over the mid stomach. Electronically Signed   By: Ulyses Southward M.D.   On: 05/19/2018 13:29   [place] Ekg Site Rite  Result Date: 05/23/2018 If Site Rite image not attached, placement could not be confirmed due to current cardiac rhythm.      Today   Subjective:   Alexander Alexander morbidly obese  Objective:   Blood pressure (!) 132/91, pulse 81, temperature 98.5 F (36.9 C), temperature source Oral, resp. rate 18, height 5\' 11"  (1.803 m), weight (!) 155.3 kg (342 lb 6.4 oz), SpO2 94 %.  .  Intake/Output Summary (Last 24 hours) at 05/26/2018 1519 Last data filed at 05/26/2018 0900 Gross per 24 hour  Intake -  Output 400 ml  Net -400 ml    Exam VITAL SIGNS: Blood pressure (!) 132/91, pulse 81, temperature 98.5 F (36.9 C), temperature source Oral, resp. rate 18, height 5\' 11"  (1.803 m), weight (!) 155.3 kg (342 lb 6.4 oz), SpO2 94 %.  GENERAL:  47 y.o.-year-old patient lying in the bed with no acute distress.  EYES: Pupils equal, round, reactive to light and accommodation. No scleral icterus. Extraocular muscles intact.  HEENT: Head atraumatic, normocephalic. Oropharynx and nasopharynx clear.  NECK:  Supple, no jugular venous distention. No thyroid enlargement, no tenderness.  LUNGS: Normal breath sounds bilaterally, no wheezing, rales,rhonchi or crepitation. No use of accessory muscles of respiration.  CARDIOVASCULAR: S1, S2 normal. No  murmurs, rubs, or gallops.  ABDOMEN: Soft, nontender, nondistended. Bowel sounds present. No organomegaly or mass.  EXTREMITIES: No pedal edema, cyanosis, or clubbing.  NEUROLOGIC: Cranial nerves II through XII are intact. Muscle strength 5/5 in all extremities. Sensation intact. Gait not checked.  PSYCHIATRIC: The patient is alert and oriented x 3.  SKIN: No obvious rash, lesion, or ulcer.   Data Review     CBC w Diff:  Lab Results  Component Value Date   WBC 6.3 05/24/2018   HGB 11.4 (L) 05/24/2018   HCT 34.2 (L) 05/24/2018   PLT 224 05/24/2018   LYMPHOPCT 33 05/24/2018   BANDSPCT 0 04/16/2018   MONOPCT 21 05/24/2018   EOSPCT 2 05/24/2018   BASOPCT 0 05/24/2018   CMP:  Lab Results  Component Value Date   NA 139 05/26/2018   NA 142 03/29/2018   K 3.6 05/26/2018   CL 99 05/26/2018   CO2 32 05/26/2018   BUN 8 05/26/2018   BUN 24 03/29/2018   CREATININE 2.08 (H) 05/26/2018   PROT 8.4 (H) 05/16/2018   PROT 8.4 12/09/2017   ALBUMIN 3.1 (L) 05/16/2018   ALBUMIN 4.2 12/09/2017   BILITOT 1.2 05/16/2018   BILITOT 1.0 12/09/2017   ALKPHOS 62 05/16/2018   AST 28 05/16/2018   ALT 23 05/16/2018  .  Micro Results No results found for this or any previous visit (from the past 240 hour(s)).      Code Status Orders  (From admission, onward)        Start     Ordered   04/07/18 0858  Full code  Continuous     04/07/18 0857    Code Status History    Date Active Date Inactive Code Status Order ID Comments User Context   12/06/2017 1513 12/08/2017 1635 Full Code [id number]  Altamese Dilling, MD ED   11/16/2017 1637 11/17/2017 1850 Full Code [id number]  Altamese Dilling, MD Inpatient  Follow-up Information    Alexander Downer, MD. Go on 05/31/2018.   Specialty:  Family Medicine Why:  hosp f/u prolonged hosp, Appointment Time: 11:00am PLEASE CALL THE OFFICE IF YOU CAN NOT MAKE THIS APPOINTMENT. Thanks! Contact information: [address] [place] [address] [phone]        Vernie Murders, MD. Go on 06/08/2018.   Specialty:  Otolaryngology Why:  post trach, Appointment Time: 3:30pm Contact information: [address] [place] [address] [phone]        Evon Slack, PA-C. Go on 06/09/2018.   Specialties:  Orthopedic Surgery, Emergency Medicine Why:  sciatic pain , Appointment Time: 10:30am Contact information: [address] [place] [address] [phone]           Discharge Medications   Allergies as of 05/26/2018      Reactions   Lexapro [escitalopram Oxalate] Swelling      Medication List    STOP taking these medications   glimepiride 2 MG tablet Commonly known as:  AMARYL   losartan 100 MG tablet Commonly known as:  COZAAR   metolazone 2.5 MG tablet Commonly known as:  ZAROXOLYN   NOVOLOG 100 UNIT/ML injection Generic drug:  insulin aspart   torsemide 20 MG tablet Commonly known as:  DEMADEX     TAKE these medications   acetaminophen 325 MG tablet Commonly known as:  TYLENOL Take 650 mg by mouth every 6 (six) hours as needed.   allopurinol 100 MG tablet Commonly known as:  ZYLOPRIM TAKE 0.5 TABLETS (50 MG TOTAL) BY MOUTH DAILY.   ALPRAZolam 0.5 MG tablet Commonly known as:  XANAX Take 1 tablet (0.5 mg total) by mouth 2 (two) times daily.   colchicine 0.6 MG tablet TAKE 1 TABLET BY MOUTH EVERY DAY   enoxaparin 150 MG/ML injection Commonly known as:  LOVENOX Inject 1 mL (150 mg total) into the skin every 12 (twelve) hours for 6 days.   fluvoxaMINE 25 MG tablet Commonly known as:  LUVOX Take 1 tablet (25 mg total) by mouth at bedtime.   gabapentin 600 MG tablet Commonly known as:  NEURONTIN Take 1 tablet (600 mg total) by mouth 2 (two) times daily.   insulin detemir 100 UNIT/ML injection Commonly known as:  LEVEMIR Inject 0.12 mLs (12 Units total) into the skin 2 (two) times daily. What changed:  how much to take   metoprolol 200  MG 24 hr tablet Commonly known as:  TOPROL-XL Take 200 mg by mouth daily.   ondansetron 4 MG tablet Commonly known as:  ZOFRAN Take 1 tablet (4 mg total) by mouth daily as needed for nausea or vomiting.   oxyCODONE-acetaminophen 5-325 MG tablet Commonly known as:  PERCOCET Take 1 tablet by mouth every 8 (eight) hours as needed for up to 5 days for severe pain.   warfarin 7.5 MG tablet Commonly known as:  COUMADIN Take 1 tablet (7.5 mg total) by mouth one time only at 6 PM. What changed:    medication strength  how much to take  when to take this            Durable Medical Equipment  (From admission, onward)        Start     Ordered   05/26/18 1142  For home use only DME Bedside commode  Once    Question:  Patient needs a bedside commode to treat with the following condition  Answer:  Cardiac arrest (HCC)  05/26/18 1141   05/26/18 1140  For home use only DME lightweight manual wheelchair with seat cushion  Once    Comments:  Bariatric wheelchair  Patient suffers from cardiac arrest was in hospital for more then month,  which impairs their ability to perform daily activities like bathing, dressing and feeding in the home.  A walker will not resolve  issue with performing activities of daily living. A wheelchair will allow patient to safely perform daily activities. Patient is not able to propel themselves in the home using a standard weight wheelchair due to general weakness. Patient can self propel in the lightweight wheelchair.  Accessories: elevating leg rests (ELRs), wheel locks, extensions and anti-tippers.   05/26/18 1140   05/26/18 0916  DME Oxygen  Once    Question Answer Comment  Mode or (Route) Nasal cannula   Liters per Minute 2   Frequency Continuous (stationary and portable oxygen unit needed)   Oxygen delivery system Gas      05/26/18 0917         Total Time in preparing paper work, data evaluation and todays exam - 35 minutes  [name]  M.D on 05/26/2018 at 3:19 PM [employer]   Office  [phone]

## 2018-05-26 NOTE — Care Management Note (Signed)
Case Management Note  Patient Details  Name: Alexander Alexander MRN: [id number] Date of Birth: [birth year]   Patient admitted with Acute cardiopulmonary arrest.  Patient lives at home with wife.  PCP Bacigalupo.  Patient has chronic O2 with [facility].  Patient has a RW and cane in the home.  PT has assessed patient and recommends home health PT.  Patient will need a bariatric WC and bariatric BSC at discharge.  Patient does not have a preference of agency.  [facility]  Equipment to be delivered prior to discharge.  RNCM signing off        Subjective/Objective:                    Action/Plan:   Expected Discharge Date:  05/26/18               Expected Discharge Plan:     In-House Referral:     Discharge planning Services     Post Acute Care Choice:  Long Term Acute Care (LTAC) Choice offered to:  Spouse  DME Arranged:    DME Agency:     HH Arranged:    HH Agency:     Status of Service:     If discussed at [employer] Length of Stay Meetings, dates discussed:    Additional Comments:  Chapman FitchBOWEN, Tatiyana Foucher T, RN 05/26/2018, 1:49 PM

## 2018-05-26 NOTE — Telephone Encounter (Signed)
Pt is being discharged from hospital today 05/26/18 and is scheduled for hospital f/u on 05/31/18. Thanks TNP

## 2018-05-26 NOTE — Plan of Care (Signed)
  Problem: Health Behavior/Discharge Planning: Goal: Ability to manage health-related needs will improve Outcome: Progressing   Problem: Clinical Measurements: Goal: Ability to maintain clinical measurements within normal limits will improve Outcome: Progressing Goal: Will remain free from infection Outcome: Progressing Goal: Diagnostic test results will improve Outcome: Progressing Goal: Respiratory complications will improve Outcome: Progressing Goal: Cardiovascular complication will be avoided Outcome: Progressing   Problem: Activity: Goal: Risk for activity intolerance will decrease Outcome: Progressing   Problem: Coping: Goal: Level of anxiety will decrease Outcome: Progressing   Problem: Elimination: Goal: Will not experience complications related to bowel motility Outcome: Progressing Goal: Will not experience complications related to urinary retention Outcome: Progressing   Problem: Skin Integrity: Goal: Risk for impaired skin integrity will decrease Outcome: Progressing   

## 2018-05-27 DIAGNOSIS — M549 Dorsalgia, unspecified: Secondary | ICD-10-CM | POA: Diagnosis not present

## 2018-05-27 DIAGNOSIS — J9621 Acute and chronic respiratory failure with hypoxia: Secondary | ICD-10-CM | POA: Diagnosis not present

## 2018-05-29 ENCOUNTER — Ambulatory Visit: Payer: Medicare HMO | Admitting: Cardiovascular Disease

## 2018-05-29 ENCOUNTER — Other Ambulatory Visit: Payer: Self-pay | Admitting: Family Medicine

## 2018-05-29 NOTE — Telephone Encounter (Signed)
I have made the 1st attempt to contact the patient or family member in charge, in order to follow up from recently being discharged from the hospital. I left a message on voicemail but I will make another attempt at a different time.  

## 2018-05-30 ENCOUNTER — Telehealth: Payer: Self-pay

## 2018-05-30 NOTE — Telephone Encounter (Signed)
FYI:: Amy with [facility] called to report patient was scheduled to have a PT/INR yesterday. She called to scheduled appointment and patient preferred she wait until today. Amy states she called patient today before going to the home and he agreed with time. She states she arrived at patient's home within 10 minutes. He did not come to the door. Amy states she will attempt once more to schedule patient for tomorrow. If he does not comply for tomorrow his case will be closed.

## 2018-05-30 NOTE — Telephone Encounter (Signed)
Transition Care Management Follow-up Telephone Call  Date of discharge and from where: [facility] on 05/26/18  How have you been since you were released from the hospital? Not feeling well today, has been nausea, dizzy, and having sciatic pain. Pain level is about the same as when pt was in the hospital. Pt declines fever, SOB, chest pain, vomiting or diarrhea.   Any questions or concerns? No   Items Reviewed:  Did the pt receive and understand the discharge instructions provided? Yes   Medications obtained and verified? Yes   Any new allergies since your discharge? No   Dietary orders reviewed? Yes  Do you have support at home? Yes   Other (ie: DME, Home Health, etc) Home health- first visit this afternoon.  Functional Questionnaire: (I = Independent and D = Dependent) ADL's: Oxygen, cane, walker, wheelchair and bedside commode.  Bathing/Dressing- D, due to weakness.   Meal Prep- D  Eating- I  Maintaining continence- I  Transferring/Ambulation- Uses walker at all times.  Managing Meds- D, wife manages medications.   Follow up appointments reviewed:    PCP Hospital f/u appt confirmed? Yes  Scheduled to see Dr Beryle FlockBacigalupo on 05/31/18 @ 11 AM.  Specialist Hospital f/u appt confirmed? Yes  Are transportation arrangements needed? No   If their condition worsens, is the pt aware to call  their PCP or go to the ED? Yes  Was the patient provided with contact information for the PCP's office or ED? Yes  Was the pt encouraged to call back with questions or concerns? Yes

## 2018-05-31 ENCOUNTER — Telehealth: Payer: Self-pay

## 2018-05-31 ENCOUNTER — Inpatient Hospital Stay: Payer: Self-pay | Admitting: Family Medicine

## 2018-05-31 DIAGNOSIS — I130 Hypertensive heart and chronic kidney disease with heart failure and stage 1 through stage 4 chronic kidney disease, or unspecified chronic kidney disease: Secondary | ICD-10-CM | POA: Diagnosis not present

## 2018-05-31 DIAGNOSIS — Z6841 Body Mass Index (BMI) 40.0 and over, adult: Secondary | ICD-10-CM | POA: Diagnosis not present

## 2018-05-31 DIAGNOSIS — N183 Chronic kidney disease, stage 3 (moderate): Secondary | ICD-10-CM | POA: Diagnosis not present

## 2018-05-31 DIAGNOSIS — I82402 Acute embolism and thrombosis of unspecified deep veins of left lower extremity: Secondary | ICD-10-CM | POA: Diagnosis not present

## 2018-05-31 DIAGNOSIS — M5432 Sciatica, left side: Secondary | ICD-10-CM | POA: Diagnosis not present

## 2018-05-31 DIAGNOSIS — E1159 Type 2 diabetes mellitus with other circulatory complications: Secondary | ICD-10-CM | POA: Diagnosis not present

## 2018-05-31 DIAGNOSIS — E1122 Type 2 diabetes mellitus with diabetic chronic kidney disease: Secondary | ICD-10-CM | POA: Diagnosis not present

## 2018-05-31 DIAGNOSIS — I4891 Unspecified atrial fibrillation: Secondary | ICD-10-CM | POA: Diagnosis not present

## 2018-05-31 DIAGNOSIS — I5042 Chronic combined systolic (congestive) and diastolic (congestive) heart failure: Secondary | ICD-10-CM | POA: Diagnosis not present

## 2018-05-31 NOTE — Telephone Encounter (Signed)
Noted  Bacigalupo, Angela M, MD, MPH [facility] 05/31/2018 10:07 AM  

## 2018-05-31 NOTE — Telephone Encounter (Signed)
Noted  Antonio Creswell, [name], MD, MPH [facility] 05/31/2018 10:07 AM

## 2018-05-31 NOTE — Telephone Encounter (Signed)
FYI...  Mrs. Andrey CampanileWilson called stating pt fell last night and hit is neck on the night stand.  She says he hit where is tracheostomy was.  She says he had some bleeding last night and shortness of breath.  The bleeding has stopped but he is still complaining of shortness of breath.  I advised Mrs. Andrey CampanileWilson that the pt would need to go to the ER to be evaluated.  She agreed and is taking him there.   Thanks,   -Vernona RiegerLaura

## 2018-06-02 ENCOUNTER — Telehealth: Payer: Self-pay

## 2018-06-02 ENCOUNTER — Telehealth: Payer: Self-pay | Admitting: *Deleted

## 2018-06-02 ENCOUNTER — Inpatient Hospital Stay: Payer: Self-pay | Admitting: Family Medicine

## 2018-06-02 NOTE — Telephone Encounter (Signed)
Unit secretary received a call from patient's wife who discharge 7/19.  CM called Ms Andrey CampanileWilson.  Says that she was informed by a staff person with [facility] - sound like may have been someone checking the oxygen equipment in the home- that the case manager needed to obtain an order for 3 liters of oxygen.  CM discussed with wife that order for the oxygen at discharge was 2 liters.  If there is any concern that patient needs higher liter flow, this must be provided by patient pcp.  Has appointment 7/29. [facility] staff person is to make home visit to train how to use portable tanks 7/27.  Even though patient has chronic home oxygen, has not ever had to use the portable.  Sound as if does not have the flow meter valve on the tanks because wife says there is no place to "hook up the tubing on the protable tanks."  Patient is being followed by home health nurse and waiting to have initial visit by physical therapy.

## 2018-06-02 NOTE — Telephone Encounter (Signed)
INR is within his normal range.  Continue current Coumadin dose.  OK for verbals for plan of care  Bacigalupo, [name], MD, MPH [facility] 06/02/2018 3:57 PM

## 2018-06-02 NOTE — Telephone Encounter (Signed)
Please review

## 2018-06-02 NOTE — Telephone Encounter (Signed)
Amy advised. She was also advised to recheck INR in 4 weeks.

## 2018-06-02 NOTE — Telephone Encounter (Signed)
Amy, a nurse with [facility] is calling to report the patient's INR.  Today it is 2.5. He is currently taking warfarin 7.5mg  daily.  She is also requesting approval for plan of care as follows:  home visits once weekly for the next 5 weeks.  Please call her back at [phone]. Thanks!

## 2018-06-06 ENCOUNTER — Ambulatory Visit (INDEPENDENT_AMBULATORY_CARE_PROVIDER_SITE_OTHER): Payer: Medicare HMO | Admitting: Family Medicine

## 2018-06-06 ENCOUNTER — Encounter: Payer: Self-pay | Admitting: Family Medicine

## 2018-06-06 VITALS — BP 130/92 | HR 100 | Temp 98.0°F | Resp 16

## 2018-06-06 DIAGNOSIS — J9611 Chronic respiratory failure with hypoxia: Secondary | ICD-10-CM | POA: Diagnosis not present

## 2018-06-06 DIAGNOSIS — E1122 Type 2 diabetes mellitus with diabetic chronic kidney disease: Secondary | ICD-10-CM | POA: Diagnosis not present

## 2018-06-06 DIAGNOSIS — I5042 Chronic combined systolic (congestive) and diastolic (congestive) heart failure: Secondary | ICD-10-CM | POA: Diagnosis not present

## 2018-06-06 DIAGNOSIS — N183 Chronic kidney disease, stage 3 unspecified: Secondary | ICD-10-CM

## 2018-06-06 DIAGNOSIS — I4891 Unspecified atrial fibrillation: Secondary | ICD-10-CM | POA: Diagnosis not present

## 2018-06-06 DIAGNOSIS — E1165 Type 2 diabetes mellitus with hyperglycemia: Secondary | ICD-10-CM | POA: Diagnosis not present

## 2018-06-06 DIAGNOSIS — Z794 Long term (current) use of insulin: Secondary | ICD-10-CM

## 2018-06-06 DIAGNOSIS — R69 Illness, unspecified: Secondary | ICD-10-CM | POA: Diagnosis not present

## 2018-06-06 DIAGNOSIS — M5432 Sciatica, left side: Secondary | ICD-10-CM | POA: Diagnosis not present

## 2018-06-06 DIAGNOSIS — F419 Anxiety disorder, unspecified: Secondary | ICD-10-CM

## 2018-06-06 DIAGNOSIS — I469 Cardiac arrest, cause unspecified: Secondary | ICD-10-CM

## 2018-06-06 DIAGNOSIS — G8929 Other chronic pain: Secondary | ICD-10-CM

## 2018-06-06 DIAGNOSIS — M5441 Lumbago with sciatica, right side: Secondary | ICD-10-CM

## 2018-06-06 DIAGNOSIS — I482 Chronic atrial fibrillation, unspecified: Secondary | ICD-10-CM

## 2018-06-06 DIAGNOSIS — Z6841 Body Mass Index (BMI) 40.0 and over, adult: Secondary | ICD-10-CM | POA: Diagnosis not present

## 2018-06-06 DIAGNOSIS — I82402 Acute embolism and thrombosis of unspecified deep veins of left lower extremity: Secondary | ICD-10-CM | POA: Diagnosis not present

## 2018-06-06 DIAGNOSIS — E1159 Type 2 diabetes mellitus with other circulatory complications: Secondary | ICD-10-CM | POA: Diagnosis not present

## 2018-06-06 DIAGNOSIS — I130 Hypertensive heart and chronic kidney disease with heart failure and stage 1 through stage 4 chronic kidney disease, or unspecified chronic kidney disease: Secondary | ICD-10-CM | POA: Diagnosis not present

## 2018-06-06 MED ORDER — ALPRAZOLAM 0.5 MG PO TABS: 0.5000 mg | ORAL_TABLET | Freq: Two times a day (BID) | ORAL | 1 refills | Status: AC

## 2018-06-06 MED ORDER — OXYCODONE-ACETAMINOPHEN 7.5-325 MG PO TABS: 1.0000 | ORAL_TABLET | ORAL | 0 refills | Status: AC | PRN

## 2018-06-06 MED ORDER — TORSEMIDE 20 MG PO TABS: 20.0000 mg | ORAL_TABLET | Freq: Every day | ORAL | 2 refills | Status: AC

## 2018-06-06 MED ORDER — GABAPENTIN 600 MG PO TABS: 600.0000 mg | ORAL_TABLET | Freq: Three times a day (TID) | ORAL | 3 refills | Status: AC

## 2018-06-06 NOTE — Progress Notes (Signed)
Patient: Alexander Alexander Male    DOB: 09/20/1971   47 y.o.   MRN: [id number] Visit Date: 06/06/2018  Today's Provider: Shirlee LatchAngela Jeffory Snelgrove, MD   I, Joslyn HyEmily Ratchford, CMA, am acting as scribe for Shirlee LatchAngela Suad Autrey, MD.  Chief Complaint  Patient presents with  . Hospitalization Follow-up   Subjective:    HPI     Follow up Hospitalization  Patient was admitted to [facility] on 04/07/2018 and discharged on 05/26/2018. He was treated for cardiac arrest. Treatment for this included being monitored in the ICU, tracheostomy, tube feedings, TPN. Telephone follow up was done on 05/30/2018. He reports this condition is Improved.  He reports he is now breathing comfortably on just nasal cannula.  His tracheostomy is healing well.  He has upcoming follow-ups with orthopedics, cardiology.  He does not believe that he has follow-up with pulmonology scheduled. ------------------------------------------------------------------------------------  Pt needs a refill of alprazolam 0.5 mg po BID.  Pt was taking oxycodone 7.5-325 for sciatica pain. He was only given 7 days of this medication. He has another week before he sees orthopedics for this pain.  Pt would like to discuss RLE weakness. He states his right knee "buckles". He is also experiencing bilateral foot edema. He is experiencing chest tightmess. He is concerned because he had pneumonia while hospitalized.   He had also previously been referred to endocrinology and pain management prior to hospitalization.  He is unable to make these appointments due to being in the ICU.  He would like new referrals placed for endocrinology and pulmonology, but would like to wait on pain management until he sees orthopedics.   Allergies  Allergen Reactions  . Lexapro [Escitalopram Oxalate] Swelling     Current Outpatient Medications:  .  acetaminophen (TYLENOL) 325 MG tablet, Take 650 mg by mouth every 6 (six) hours as needed., Disp: , Rfl:  .   allopurinol (ZYLOPRIM) 100 MG tablet, TAKE 0.5 TABLETS (50 MG TOTAL) BY MOUTH DAILY., Disp: 15 tablet, Rfl: 3 .  ALPRAZolam (XANAX) 0.5 MG tablet, TAKE 1 TABLET (0.5 MG TOTAL) BY MOUTH 2 (TWO) TIMES DAILY., Disp: 60 tablet, Rfl: 1 .  colchicine 0.6 MG tablet, TAKE 1 TABLET BY MOUTH EVERY DAY, Disp: 30 tablet, Rfl: 11 .  gabapentin (NEURONTIN) 600 MG tablet, Take 1 tablet (600 mg total) by mouth 2 (two) times daily. (Patient taking differently: Take 600 mg by mouth daily. ), Disp: 180 tablet, Rfl: 3 .  insulin aspart protamine- aspart (NOVOLOG MIX 70/30) (70-30) 100 UNIT/ML injection, Inject into the skin., Disp: , Rfl:  .  insulin detemir (LEVEMIR) 100 UNIT/ML injection, Inject 0.12 mLs (12 Units total) into the skin 2 (two) times daily., Disp: 40 mL, Rfl: 5 .  metoprolol (TOPROL-XL) 200 MG 24 hr tablet, Take 200 mg by mouth daily. , Disp: , Rfl: 0 .  ondansetron (ZOFRAN) 4 MG tablet, Take 1 tablet (4 mg total) by mouth daily as needed for nausea or vomiting., Disp: 30 tablet, Rfl: 1 .  warfarin (COUMADIN) 7.5 MG tablet, Take 1 tablet (7.5 mg total) by mouth one time only at 6 PM., Disp: , Rfl:  .  fluvoxaMINE (LUVOX) 25 MG tablet, Take 1 tablet (25 mg total) by mouth at bedtime. (Patient not taking: Reported on 05/30/2018), Disp: 15 tablet, Rfl: 0  Review of Systems  Constitutional: Positive for activity change, appetite change (improving), diaphoresis and unexpected weight change. Negative for chills, fatigue and fever.  Respiratory: Positive for chest tightness. Negative for  cough, shortness of breath and wheezing.   Cardiovascular: Positive for leg swelling. Negative for chest pain and palpitations.  Musculoskeletal: Positive for back pain.  Neurological: Positive for weakness.    Social History   Tobacco Use  . Smoking status: Former Smoker    Packs/day: 0.50    Years: 20.00    Pack years: 10.00    Types: Cigarettes    Last attempt to quit: 04/25/2014    Years since quitting: 4.1  .  Smokeless tobacco: Never Used  . Tobacco comment: quit around june 2015  Substance Use Topics  . Alcohol use: No    Frequency: Never   Objective:   BP (!) 130/92 (BP Location: Right Arm, Patient Position: Sitting, Cuff Size: Large)   Pulse 100   Temp 98 F (36.7 C) (Oral)   Resp 16   SpO2 96% Comment: 2 L O2 Vitals:   06/06/18 1059  BP: (!) 130/92  Pulse: 100  Resp: 16  Temp: 98 F (36.7 C)  TempSrc: Oral  SpO2: 96%     Physical Exam  Constitutional: He is oriented to person, place, and time. He appears well-developed and well-nourished. No distress.  Sitting in a wheelchair  HENT:  Head: Normocephalic and atraumatic.  Right Ear: External ear normal.  Left Ear: External ear normal.  Nose: Nose normal.  Mouth/Throat: Oropharynx is clear and moist.  Eyes: Pupils are equal, round, and reactive to light. Conjunctivae are normal. No scleral icterus.  Neck: Neck supple.  Tracheostomy site healing well  Cardiovascular: Normal rate and intact distal pulses. An irregularly irregular rhythm present.  Murmur heard. Pulmonary/Chest: Effort normal and breath sounds normal. No respiratory distress. He has no wheezes. He has no rales.  Abdominal: Soft. He exhibits no distension. There is no tenderness.  Musculoskeletal: He exhibits edema (2+ pitting edema bilateral feet).  Back: Tenderness to palpation bilaterally over SI joints and surrounding musculature.  Positive straight leg raise of the right leg.  Negative straight leg raise of the left leg.  Strength 5 out of 5 throughout left leg, but 4+ out of 5 in right leg.   Lymphadenopathy:    He has no cervical adenopathy.  Neurological: He is alert and oriented to person, place, and time.  Skin: Skin is warm and dry. Capillary refill takes less than 2 seconds. No rash noted.  Psychiatric: He has a normal mood and affect. His behavior is normal. Thought content normal.  Vitals reviewed.      Assessment & Plan:   Problem List  Items Addressed This Visit      Cardiovascular and Mediastinum   Atrial fibrillation (HCC) (Chronic)    Chronic and stable Rate controlled currently Continue current meds including anticoagulation Also followed by cardiology      Relevant Medications   torsemide (DEMADEX) 20 MG tablet   Other Relevant Orders   CBC (Completed)   Chronic combined systolic (congestive) and diastolic (congestive) heart failure (HCC)    Has previously been managed by the heart failure clinic Lungs are clear, but he does show edema in bilateral feet Advised elevation of feet and compression socks if possible Continue fluid restriction and avoiding sodium intake His diuretics were stopped during hospitalization, but I will resume torsemide 20 mg daily He will also be followed by cardiology and heart failure clinic in the [place] can continue to titrate diuretics as needed Check CMP today      Relevant Medications   torsemide (DEMADEX) 20 MG tablet  Cardiac arrest [facility]) - Primary    Patient is status post cardiac arrest while in the hospital for back pain He is now doing quite well He will follow with cardiology His wife is concerned that his heart rate would drop to 11 sometimes on the monitor I suspect this is just related to his A. fib and the irregular heartbeat that can lead to false readings on monitors, but I advised him to discuss with cardiology      Relevant Medications   torsemide (DEMADEX) 20 MG tablet   Other Relevant Orders   CBC (Completed)   Comprehensive metabolic panel (Completed)   Magnesium (Completed)   Phosphorus (Completed)     Respiratory   Chronic respiratory failure (HCC)    No longer needing ventilation Satting well and breathing comfortably on nasal cannula today Will place referral to pulmonology for ongoing follow-up      Relevant Orders   Ambulatory referral to Pulmonology     Endocrine   T2DM (type 2 diabetes mellitus) (HCC)    Previously very  uncontrolled CBGs during hospitalization were better, however, but this was on strict insulin regimen Continue current regimen at this time Next month, we will recheck A1c Discussed low-carb diet Placed new referral to endocrinology      Relevant Orders   Ambulatory referral to Endocrinology     Genitourinary   CKD (chronic kidney disease) stage 3, GFR 30-59 ml/min (HCC)    Patient with episode of ATN during hospitalization, but creatinine is back near baseline I will recheck today He is managed by nephrology and should have ongoing management with them Avoid NSAIDs and other nephrotoxic medications      Relevant Orders   CBC (Completed)   Comprehensive metabolic panel (Completed)   Magnesium (Completed)   Phosphorus (Completed)     Other   Anxiety    Stable Is being followed by psychiatry Refilled Xanax but will follow psychiatry lead on this in the future      Relevant Medications   ALPRAZolam (XANAX) 0.5 MG tablet   Chronic back pain    Patient with chronic bilateral back pain Now with sciatica of his right leg Discussed with patient that his right leg weakness is likely related to his sciatica I will refill his narcotics, but do not expect these to be long-term management Also since increasing his gabapentin to see if this will help with this neuropathic pain He will be seeing orthopedics next week for ongoing management May need to consider pain management referral again in the future      Relevant Medications   oxyCODONE-acetaminophen (PERCOCET) 7.5-325 MG tablet       Return in about 1 month (around 07/04/2018) for chronic disease f/u.  Addressed extensive list of chronic and acute medical problems today requiring extensive time in counseling and coordination of care.  Over half of this 45 minute visit were spent in counseling and coordinating care of multiple medical problems.   The entirety of the information documented in the History of Present Illness,  Review of Systems and Physical Exam were personally obtained by me. Portions of this information were initially documented by Irving Burton Ratchford, CMA and reviewed by me for thoroughness and accuracy.    Erasmo Downer, MD, MPH [facility] 06/07/2018 8:55 AM

## 2018-06-07 LAB — COMPREHENSIVE METABOLIC PANEL
ALT: 10 IU/L (ref 0–44)
AST: 14 IU/L (ref 0–40)
Albumin/Globulin Ratio: 1 — ABNORMAL LOW (ref 1.2–2.2)
Albumin: 3.8 g/dL (ref 3.5–5.5)
Alkaline Phosphatase: 61 IU/L (ref 39–117)
BILIRUBIN TOTAL: 0.6 mg/dL (ref 0.0–1.2)
BUN/Creatinine Ratio: 11 (ref 9–20)
BUN: 24 mg/dL (ref 6–24)
CALCIUM: 9.5 mg/dL (ref 8.7–10.2)
CHLORIDE: 94 mmol/L — AB (ref 96–106)
CO2: 32 mmol/L — ABNORMAL HIGH (ref 20–29)
Creatinine, Ser: 2.09 mg/dL — ABNORMAL HIGH (ref 0.76–1.27)
GFR, EST AFRICAN AMERICAN: 42 mL/min/{1.73_m2} — AB (ref >59)
GFR, EST NON AFRICAN AMERICAN: 36 mL/min/{1.73_m2} — AB (ref >59)
GLUCOSE: 205 mg/dL — AB (ref 65–99)
Globulin, Total: 4 g/dL (ref 1.5–4.5)
Potassium: 5.3 mmol/L — ABNORMAL HIGH (ref 3.5–5.2)
Sodium: 142 mmol/L (ref 134–144)
TOTAL PROTEIN: 7.8 g/dL (ref 6.0–8.5)

## 2018-06-07 LAB — CBC
Hematocrit: 38.2 % (ref 37.5–51.0)
Hemoglobin: 12.4 g/dL — ABNORMAL LOW (ref 13.0–17.7)
MCH: 26.9 pg (ref 26.6–33.0)
MCHC: 32.5 g/dL (ref 31.5–35.7)
MCV: 83 fL (ref 79–97)
Platelets: 313 10*3/uL (ref 150–450)
RBC: 4.61 x10E6/uL (ref 4.14–5.80)
RDW: 18.4 % — ABNORMAL HIGH (ref 12.3–15.4)
WBC: 5 10*3/uL (ref 3.4–10.8)

## 2018-06-07 LAB — PHOSPHORUS: PHOSPHORUS: 3.6 mg/dL (ref 2.5–4.5)

## 2018-06-07 LAB — MAGNESIUM: Magnesium: 1.8 mg/dL (ref 1.6–2.3)

## 2018-06-07 NOTE — Assessment & Plan Note (Signed)
Patient with episode of ATN during hospitalization, but creatinine is back near baseline I will recheck today He is managed by nephrology and should have ongoing management with them Avoid NSAIDs and other nephrotoxic medications

## 2018-06-07 NOTE — Assessment & Plan Note (Signed)
No longer needing ventilation Satting well and breathing comfortably on nasal cannula today Will place referral to pulmonology for ongoing follow-up

## 2018-06-07 NOTE — Assessment & Plan Note (Signed)
Chronic and stable Rate controlled currently Continue current meds including anticoagulation Also followed by cardiology

## 2018-06-07 NOTE — Assessment & Plan Note (Signed)
Stable Is being followed by psychiatry Refilled Xanax but will follow psychiatry lead on this in the future

## 2018-06-07 NOTE — Assessment & Plan Note (Signed)
Patient is status post cardiac arrest while in the hospital for back pain He is now doing quite well He will follow with cardiology His wife is concerned that his heart rate would drop to 11 sometimes on the monitor I suspect this is just related to his A. fib and the irregular heartbeat that can lead to false readings on monitors, but I advised him to discuss with cardiology

## 2018-06-07 NOTE — Assessment & Plan Note (Signed)
Has previously been managed by the heart failure clinic Lungs are clear, but he does show edema in bilateral feet Advised elevation of feet and compression socks if possible Continue fluid restriction and avoiding sodium intake His diuretics were stopped during hospitalization, but I will resume torsemide 20 mg daily He will also be followed by cardiology and heart failure clinic in the 3 of us can continue to titrate diuretics as needed Check CMP today

## 2018-06-07 NOTE — Assessment & Plan Note (Signed)
Patient with chronic bilateral back pain Now with sciatica of his right leg Discussed with patient that his right leg weakness is likely related to his sciatica I will refill his narcotics, but do not expect these to be long-term management Also since increasing his gabapentin to see if this will help with this neuropathic pain He will be seeing orthopedics next week for ongoing management May need to consider pain management referral again in the future

## 2018-06-07 NOTE — Assessment & Plan Note (Signed)
Previously very uncontrolled CBGs during hospitalization were better, however, but this was on strict insulin regimen Continue current regimen at this time Next month, we will recheck A1c Discussed low-carb diet Placed new referral to endocrinology

## 2018-06-08 ENCOUNTER — Telehealth: Payer: Self-pay

## 2018-06-08 NOTE — Telephone Encounter (Signed)
-----   Message from Erasmo DownerAngela M Bacigalupo, MD sent at 06/07/2018 11:00 AM EDT ----- Anemia is improving since hospitalization.  Kidney function is stable.  Blood sugar is high.  Normal liver function.  Potassium is slightly elevated, but this will decrease with the torsemide that we just resumed.  Other electrolytes are normal.  Bacigalupo, [name], MD, MPH [facility] 06/07/2018 11:00 AM

## 2018-06-08 NOTE — Telephone Encounter (Signed)
Pt advised.

## 2018-06-09 DIAGNOSIS — M5442 Lumbago with sciatica, left side: Secondary | ICD-10-CM | POA: Diagnosis not present

## 2018-06-09 DIAGNOSIS — M5432 Sciatica, left side: Secondary | ICD-10-CM | POA: Diagnosis not present

## 2018-06-09 DIAGNOSIS — M47816 Spondylosis without myelopathy or radiculopathy, lumbar region: Secondary | ICD-10-CM | POA: Diagnosis not present

## 2018-06-09 DIAGNOSIS — M5441 Lumbago with sciatica, right side: Secondary | ICD-10-CM | POA: Diagnosis not present

## 2018-06-09 DIAGNOSIS — G8929 Other chronic pain: Secondary | ICD-10-CM | POA: Diagnosis not present

## 2018-06-09 DIAGNOSIS — M5431 Sciatica, right side: Secondary | ICD-10-CM | POA: Diagnosis not present

## 2018-06-12 ENCOUNTER — Other Ambulatory Visit: Payer: Self-pay | Admitting: Orthopedic Surgery

## 2018-06-12 DIAGNOSIS — M5442 Lumbago with sciatica, left side: Principal | ICD-10-CM

## 2018-06-12 DIAGNOSIS — M5441 Lumbago with sciatica, right side: Principal | ICD-10-CM

## 2018-06-12 DIAGNOSIS — M5432 Sciatica, left side: Secondary | ICD-10-CM

## 2018-06-12 DIAGNOSIS — G8929 Other chronic pain: Secondary | ICD-10-CM

## 2018-06-12 DIAGNOSIS — M5431 Sciatica, right side: Secondary | ICD-10-CM

## 2018-06-13 ENCOUNTER — Telehealth: Payer: Self-pay

## 2018-06-13 NOTE — Telephone Encounter (Signed)
Jae DireKate, a representative from [employer] called saying that the patient is not currently on a STATIN, and to send a recommendation to Dr. B to start the patient on one.   Any questions please contact her at [phone]. Thanks!

## 2018-06-13 NOTE — Telephone Encounter (Signed)
Please review

## 2018-06-14 NOTE — Telephone Encounter (Signed)
[email or web address]

## 2018-06-14 NOTE — Telephone Encounter (Signed)
Patient is also followed by cardiology.  Given his medical conditions, including diabetes, and recent cardiac arrest, patient should be on a statin.  I wonder if he knows any reason why cardiology has not put him on a statin or if he has not tolerated this in the past.  If he does not know reason he is not on one, we should start atorvastatin 40 mg daily.  Okay to send Rx if he agrees.  Erasmo DownerBacigalupo, Angela M, MD, MPH [facility] 06/14/2018 11:22 AM

## 2018-06-14 NOTE — Telephone Encounter (Signed)
Pt states he has an appointment with a new cardiologist next month, and will discuss this with the new cardiologist.

## 2018-06-16 ENCOUNTER — Telehealth: Payer: Self-pay | Admitting: Family Medicine

## 2018-06-16 DIAGNOSIS — E1159 Type 2 diabetes mellitus with other circulatory complications: Secondary | ICD-10-CM | POA: Diagnosis not present

## 2018-06-16 DIAGNOSIS — N183 Chronic kidney disease, stage 3 (moderate): Secondary | ICD-10-CM | POA: Diagnosis not present

## 2018-06-16 DIAGNOSIS — I4891 Unspecified atrial fibrillation: Secondary | ICD-10-CM | POA: Diagnosis not present

## 2018-06-16 DIAGNOSIS — I82402 Acute embolism and thrombosis of unspecified deep veins of left lower extremity: Secondary | ICD-10-CM | POA: Diagnosis not present

## 2018-06-16 DIAGNOSIS — Z6841 Body Mass Index (BMI) 40.0 and over, adult: Secondary | ICD-10-CM | POA: Diagnosis not present

## 2018-06-16 DIAGNOSIS — I130 Hypertensive heart and chronic kidney disease with heart failure and stage 1 through stage 4 chronic kidney disease, or unspecified chronic kidney disease: Secondary | ICD-10-CM | POA: Diagnosis not present

## 2018-06-16 DIAGNOSIS — E1122 Type 2 diabetes mellitus with diabetic chronic kidney disease: Secondary | ICD-10-CM | POA: Diagnosis not present

## 2018-06-16 DIAGNOSIS — I5042 Chronic combined systolic (congestive) and diastolic (congestive) heart failure: Secondary | ICD-10-CM | POA: Diagnosis not present

## 2018-06-16 DIAGNOSIS — M5432 Sciatica, left side: Secondary | ICD-10-CM | POA: Diagnosis not present

## 2018-06-16 NOTE — Telephone Encounter (Signed)
Katie with [facility] called for PT orders once a week for one week and twice a week for three weeks.  She also wanted to let you know he had a fall last night with no injuries.  CB#  [phone]  Thanks teri

## 2018-06-16 NOTE — Telephone Encounter (Signed)
Yes. OK for verbal  Erasmo DownerBacigalupo, Davionte Lusby M, MD, MPH [facility] 06/16/2018 4:43 PM

## 2018-06-16 NOTE — Telephone Encounter (Signed)
OK for verbals 

## 2018-06-16 NOTE — Telephone Encounter (Signed)
Noted.  Good plan.  Erasmo DownerBacigalupo, Alinah Sheard M, MD, MPH [facility] 06/16/2018 4:43 PM

## 2018-06-16 NOTE — Telephone Encounter (Signed)
Please review

## 2018-06-16 NOTE — Telephone Encounter (Signed)
Alexander Alexander with [facility] called saying she seen him this afternoon and he stated he has been having muscle spasms in his arms.  She reviewed his medications and did not see any concerns.  She told him to take his xanax as scheduled and see if that helps. He has not been taking it.  She sees him next week and will fu then  Thanks teri

## 2018-06-18 DIAGNOSIS — J9621 Acute and chronic respiratory failure with hypoxia: Secondary | ICD-10-CM | POA: Diagnosis not present

## 2018-06-19 NOTE — Telephone Encounter (Signed)
Emily advised. 

## 2018-06-22 ENCOUNTER — Telehealth: Payer: Self-pay

## 2018-06-22 DIAGNOSIS — I4891 Unspecified atrial fibrillation: Secondary | ICD-10-CM | POA: Diagnosis not present

## 2018-06-22 DIAGNOSIS — I130 Hypertensive heart and chronic kidney disease with heart failure and stage 1 through stage 4 chronic kidney disease, or unspecified chronic kidney disease: Secondary | ICD-10-CM | POA: Diagnosis not present

## 2018-06-22 DIAGNOSIS — N183 Chronic kidney disease, stage 3 (moderate): Secondary | ICD-10-CM | POA: Diagnosis not present

## 2018-06-22 DIAGNOSIS — I82402 Acute embolism and thrombosis of unspecified deep veins of left lower extremity: Secondary | ICD-10-CM | POA: Diagnosis not present

## 2018-06-22 DIAGNOSIS — E1122 Type 2 diabetes mellitus with diabetic chronic kidney disease: Secondary | ICD-10-CM | POA: Diagnosis not present

## 2018-06-22 DIAGNOSIS — I5042 Chronic combined systolic (congestive) and diastolic (congestive) heart failure: Secondary | ICD-10-CM | POA: Diagnosis not present

## 2018-06-22 DIAGNOSIS — Z6841 Body Mass Index (BMI) 40.0 and over, adult: Secondary | ICD-10-CM | POA: Diagnosis not present

## 2018-06-22 DIAGNOSIS — M5432 Sciatica, left side: Secondary | ICD-10-CM | POA: Diagnosis not present

## 2018-06-22 DIAGNOSIS — E1159 Type 2 diabetes mellitus with other circulatory complications: Secondary | ICD-10-CM | POA: Diagnosis not present

## 2018-06-22 NOTE — Telephone Encounter (Signed)
Amy with [facility] called stating pt has been having increase fatigue and unsteadiness recently.  She states pt rolled out of his bed the other day. (He was not injured.)     Amy discovered pt has been increasing his Gabapentin on his own.  He is up to 1800mg  total a day.  She states he has been doing this because the lower dose (1,200mg ) is not helping with his neuropathy.  She is thinking the increased gabapentin and xanax is causing the increased fatigue.  Do you want him to go back to the 1200mg  a day, or schedule a follow up visit sooner then 08/30?  Thanks,   -Vernona RiegerLaura

## 2018-06-22 NOTE — Telephone Encounter (Signed)
Please review

## 2018-06-23 NOTE — Telephone Encounter (Signed)
With his kidney function, he should not be taking more than 1200 mg total per day.  He should decrease back to that.  I agree that this plus Xanax could be causing this problem.  If he does not improve on lower gabapentin, I'll see him back sooner.  Erasmo DownerBacigalupo, Moe Graca M, MD, MPH [facility] 06/23/2018 8:27 AM

## 2018-06-23 NOTE — Telephone Encounter (Signed)
LM for pt to call back.

## 2018-06-26 ENCOUNTER — Other Ambulatory Visit: Payer: Self-pay | Admitting: Family Medicine

## 2018-06-26 DIAGNOSIS — G4733 Obstructive sleep apnea (adult) (pediatric): Secondary | ICD-10-CM | POA: Diagnosis not present

## 2018-06-26 DIAGNOSIS — S11019D Unspecified open wound of larynx, subsequent encounter: Secondary | ICD-10-CM | POA: Diagnosis not present

## 2018-06-27 ENCOUNTER — Telehealth: Payer: Self-pay | Admitting: Family Medicine

## 2018-06-27 ENCOUNTER — Ambulatory Visit: Payer: Medicare HMO

## 2018-06-27 DIAGNOSIS — M549 Dorsalgia, unspecified: Secondary | ICD-10-CM | POA: Diagnosis not present

## 2018-06-27 DIAGNOSIS — J9621 Acute and chronic respiratory failure with hypoxia: Secondary | ICD-10-CM | POA: Diagnosis not present

## 2018-06-27 NOTE — Telephone Encounter (Signed)
[email or web address]

## 2018-06-27 NOTE — Telephone Encounter (Signed)
Patient advised. He states he will attempted to decrease Gabapentin to 1200 mg daily, but states it probably will not help with the pain. He is aware that his follow up is on 07/07/18.

## 2018-06-27 NOTE — Telephone Encounter (Signed)
Irving BurtonEmily with [facility] called saying patient cancelled his PT today due to pain  Thanks teri

## 2018-06-28 ENCOUNTER — Telehealth: Payer: Self-pay

## 2018-06-28 LAB — POCT INR: INR: 5.5 — AB (ref 0.9–1.1)

## 2018-06-28 NOTE — Telephone Encounter (Signed)
Noted.  Erasmo DownerBacigalupo, Irasema Chalk M, MD, MPH [facility] 06/28/2018 11:02 AM

## 2018-06-28 NOTE — Telephone Encounter (Signed)
Misty ([facility]) called with pt's most recent INR results, which were 5.5. Pt is currently taking coumadin 7.5 mg po qd. Per verbal order from Dr. Leonard SchwartzB, this will be changed to 7.5 mg qd, except 5 mg on Mondays and Fridays. Today's dose will be held. This was to be rechecked in 2 weeks, but today is the last home nurse visit. Pt will have INR rechecked on scheduled FU, which is 07/07/2018.

## 2018-06-30 ENCOUNTER — Telehealth: Payer: Self-pay | Admitting: Family Medicine

## 2018-06-30 NOTE — Telephone Encounter (Signed)
This encounter was created in error - please disregard.

## 2018-06-30 NOTE — Telephone Encounter (Signed)
Alexander Alexander with [facility] stated that pt has refused in home physical therapy so they are going to close out the referral. Please advise. Thanks TNP

## 2018-06-30 NOTE — Telephone Encounter (Signed)
Please review

## 2018-06-30 NOTE — Telephone Encounter (Signed)
Pt to set up apt at next OV on 07/07/18. -MM

## 2018-06-30 NOTE — Telephone Encounter (Signed)
OK.  Will discuss at next visit with patient and see if he is willing to try again in the future.  Erasmo DownerBacigalupo, Duana Benedict M, MD, MPH [facility] 06/30/2018 4:52 PM

## 2018-07-07 ENCOUNTER — Encounter: Payer: Self-pay | Admitting: Family Medicine

## 2018-07-07 ENCOUNTER — Ambulatory Visit (INDEPENDENT_AMBULATORY_CARE_PROVIDER_SITE_OTHER): Payer: Medicare HMO | Admitting: Family Medicine

## 2018-07-07 VITALS — BP 136/90 | HR 84 | Temp 98.2°F

## 2018-07-07 DIAGNOSIS — I10 Essential (primary) hypertension: Secondary | ICD-10-CM | POA: Diagnosis not present

## 2018-07-07 DIAGNOSIS — J9611 Chronic respiratory failure with hypoxia: Secondary | ICD-10-CM

## 2018-07-07 DIAGNOSIS — Z952 Presence of prosthetic heart valve: Secondary | ICD-10-CM

## 2018-07-07 DIAGNOSIS — M1A3790 Chronic gout due to renal impairment, unspecified ankle and foot, without tophus (tophi): Secondary | ICD-10-CM

## 2018-07-07 DIAGNOSIS — G8929 Other chronic pain: Secondary | ICD-10-CM

## 2018-07-07 DIAGNOSIS — R296 Repeated falls: Secondary | ICD-10-CM | POA: Diagnosis not present

## 2018-07-07 DIAGNOSIS — E1142 Type 2 diabetes mellitus with diabetic polyneuropathy: Secondary | ICD-10-CM | POA: Diagnosis not present

## 2018-07-07 DIAGNOSIS — R5381 Other malaise: Secondary | ICD-10-CM

## 2018-07-07 DIAGNOSIS — Z23 Encounter for immunization: Secondary | ICD-10-CM

## 2018-07-07 DIAGNOSIS — R69 Illness, unspecified: Secondary | ICD-10-CM | POA: Diagnosis not present

## 2018-07-07 DIAGNOSIS — N183 Chronic kidney disease, stage 3 unspecified: Secondary | ICD-10-CM

## 2018-07-07 DIAGNOSIS — I482 Chronic atrial fibrillation, unspecified: Secondary | ICD-10-CM

## 2018-07-07 DIAGNOSIS — Z794 Long term (current) use of insulin: Secondary | ICD-10-CM | POA: Diagnosis not present

## 2018-07-07 DIAGNOSIS — E1165 Type 2 diabetes mellitus with hyperglycemia: Secondary | ICD-10-CM

## 2018-07-07 DIAGNOSIS — M5441 Lumbago with sciatica, right side: Secondary | ICD-10-CM

## 2018-07-07 DIAGNOSIS — I5042 Chronic combined systolic (congestive) and diastolic (congestive) heart failure: Secondary | ICD-10-CM

## 2018-07-07 DIAGNOSIS — F4321 Adjustment disorder with depressed mood: Secondary | ICD-10-CM

## 2018-07-07 LAB — POCT INR
INR: 5.8 — AB (ref 2.0–3.0)
PT: 70.1

## 2018-07-07 LAB — POCT GLYCOSYLATED HEMOGLOBIN (HGB A1C): HEMOGLOBIN A1C: 8 % — AB (ref 4.0–5.6)

## 2018-07-07 MED ORDER — WARFARIN SODIUM 5 MG PO TABS: ORAL_TABLET | ORAL | Status: AC

## 2018-07-07 MED ORDER — INSULIN DETEMIR 100 UNIT/ML ~~LOC~~ SOLN: 25.0000 [IU] | Freq: Two times a day (BID) | SUBCUTANEOUS | 5 refills | Status: AC

## 2018-07-07 MED ORDER — COLCHICINE 0.6 MG PO TABS: 0.6000 mg | ORAL_TABLET | Freq: Every day | ORAL | 11 refills | Status: AC

## 2018-07-07 MED ORDER — INSULIN ASPART 100 UNIT/ML FLEXPEN: 35.0000 [IU] | PEN_INJECTOR | Freq: Three times a day (TID) | SUBCUTANEOUS | 5 refills | Status: AC

## 2018-07-07 MED ORDER — ALLOPURINOL 100 MG PO TABS: 50.0000 mg | ORAL_TABLET | Freq: Every day | ORAL | 3 refills | Status: AC

## 2018-07-07 NOTE — Assessment & Plan Note (Signed)
Initially somnolent, hypoxemic, and tachypneic This resolved with increasing oxygen to 3 L He is breathing comfortably and lungs sound clear prior to leaving the clinic today

## 2018-07-07 NOTE — Assessment & Plan Note (Signed)
Recheck renal function panel today He needs to call and set up a follow-up appointment with nephrology Avoid NSAIDs and other nephrotoxic medications

## 2018-07-07 NOTE — Assessment & Plan Note (Signed)
Patient with significant debility and frequent falls after being in the hospital for multiple months after cardiac arrest Will refer to home health physical therapy with a different agency

## 2018-07-07 NOTE — Assessment & Plan Note (Signed)
Chronic and uncontrolled Continue gabapentin at current dose, but will reevaluate with new GFR collected today Has been unable to tolerate Lyrica and amitriptyline in the past Referral to pain management

## 2018-07-07 NOTE — Assessment & Plan Note (Signed)
Secondary to CKD Doing well without flare since starting low-dose allopurinol Need to be careful with titrating this given his CKD We are also monitoring his INR closely while he was on allopurinol Continue colchicine daily until 6 months after uric acid level is at goal Recheck renal function panel and uric acid level today

## 2018-07-07 NOTE — Progress Notes (Signed)
Patient: Alexander Alexander Male    DOB: 03-31-1971   47 y.o.   MRN: [id number] Visit Date: 07/07/2018  Today's Provider: Shirlee Latch, MD   Chief Complaint  Patient presents with  . Diabetes  . Hypertension  . Atrial Fibrillation   Subjective:    I, Presley Raddle, CMA, am acting as a scribe for Shirlee Latch, MD.   HPI  Diabetes Mellitus Type II, Follow-up:   Lab Results  Component Value Date   HGBA1C 8.0 (A) 07/07/2018   HGBA1C 11.7 (H) 03/29/2018   HGBA1C 14.0 (A) 03/29/2018    Last seen for diabetes 1 months ago.  Management since then includes no changes. He reports good compliance with treatment. He is not having side effects.  Current symptoms include none Home blood sugar records: fasting range: 120-240  Episodes of hypoglycemia? No  Current Insulin Regimen: Novolog 35 units TID and Levemir 20 units BID Most Recent Eye Exam: 01/25/18 Weight trend: stable Current diet: vegetables and protein Current exercise: no regular exercise  Pertinent Labs:    Component Value Date/Time   CHOL 175 12/09/2017 1017   TRIG 133 04/19/2018 1403   HDL 31 (L) 12/09/2017 1017   LDLCALC 77 12/09/2017 1017   CREATININE 2.09 (H) 06/06/2018 1200    Wt Readings from Last 3 Encounters:  05/26/18 (!) 342 lb 6.4 oz (155.3 kg)  03/29/18 (!) 377 lb (171 kg)  02/16/18 (!) 383 lb (173.7 kg)    ------------------------------------------------------------------------  Hypertension, follow-up:  BP Readings from Last 3 Encounters:  07/07/18 136/90  06/06/18 (!) 130/92  05/26/18 (!) 132/91    He was last seen for hypertension 1 months ago.  BP at that visit was 130/92. Management changes since that visit include no changes in medication. He reports good compliance with treatment. He is not having side effects.  He is not exercising. He is adherent to low salt diet.   Outside blood pressures are being checked at home. He is experiencing nausea, depression,  foot pain/swelling and sciatic pain.  Patient denies chest pain, chest pressure/discomfort, claudication, dyspnea, exertional chest pressure/discomfort, fatigue, irregular heart beat, near-syncope, orthopnea, palpitations, paroxysmal nocturnal dyspnea, syncope and tachypnea.     Weight trend: stable Wt Readings from Last 3 Encounters:  05/26/18 (!) 342 lb 6.4 oz (155.3 kg)  03/29/18 (!) 377 lb (171 kg)  02/16/18 (!) 383 lb (173.7 kg)   ------------------------------------------------------------------------  Taking Coumadin 7.5 mg daily, except 5 mg twice weekly.  Not eating more Vit K.  No missed doses.  Indication is aortic valve replacement and A fib.  Goal INR 2.5-3.5.  Has fallen 4 times in last several days. Is going to have eyes rechecked as he has blurry vision. Had to fire [facility] PT as they were not keeping a consistent schedule.  Is worried that he is back sliding.    He had increased his gabapentin dose to twice upper limit due to increased pain.  I was notified by home health nurse that he was doing this and he has since reduced it back to what was prescribed.  He has noticed more tremor and shaking especially in his arms since that time.  He reports he is urinating okay.  His pain continues to be uncontrolled with chronic right-sided sciatica and diabetic neuropathy.  This makes him feel depressed.  He has not seen a nephrologist since his hospitalization.  I believe that he was seen by Dr. Thedore Mins in March, prior to hospitalization.  He has upcoming appointments with pulmonology, ENT, cardiology, and endocrinology.    Allergies  Allergen Reactions  . Lexapro [Escitalopram Oxalate] Swelling     Current Outpatient Medications:  .  acetaminophen (TYLENOL) 325 MG tablet, Take 650 mg by mouth every 6 (six) hours as needed., Disp: , Rfl:  .  allopurinol (ZYLOPRIM) 100 MG tablet, Take 0.5 tablets (50 mg total) by mouth daily., Disp: 15 tablet, Rfl: 3 .  ALPRAZolam (XANAX) 0.5 MG tablet,  Take 1 tablet (0.5 mg total) by mouth 2 (two) times daily., Disp: 60 tablet, Rfl: 1 .  colchicine 0.6 MG tablet, Take 1 tablet (0.6 mg total) by mouth daily., Disp: 30 tablet, Rfl: 11 .  gabapentin (NEURONTIN) 600 MG tablet, Take 1 tablet (600 mg total) by mouth 3 (three) times daily., Disp: 180 tablet, Rfl: 3 .  insulin detemir (LEVEMIR) 100 UNIT/ML injection, Inject 0.25 mLs (25 Units total) into the skin 2 (two) times daily., Disp: 40 mL, Rfl: 5 .  metoprolol (TOPROL-XL) 200 MG 24 hr tablet, Take 200 mg by mouth daily. , Disp: , Rfl: 0 .  ondansetron (ZOFRAN) 4 MG tablet, Take 1 tablet (4 mg total) by mouth daily as needed for nausea or vomiting., Disp: 30 tablet, Rfl: 1 .  oxyCODONE-acetaminophen (PERCOCET) 7.5-325 MG tablet, Take 1 tablet by mouth every 4 (four) hours as needed for severe pain., Disp: 28 tablet, Rfl: 0 .  torsemide (DEMADEX) 20 MG tablet, Take 1 tablet (20 mg total) by mouth daily., Disp: 30 tablet, Rfl: 2 .  warfarin (COUMADIN) 5 MG tablet, Take 5 mg PO daily, except 7.5mg  on mondays and fridays, Disp: , Rfl:  .  fluvoxaMINE (LUVOX) 25 MG tablet, Take 1 tablet (25 mg total) by mouth at bedtime. (Patient not taking: Reported on 05/30/2018), Disp: 15 tablet, Rfl: 0 .  insulin aspart (NOVOLOG FLEXPEN) 100 UNIT/ML FlexPen, Inject 35 Units into the skin 3 (three) times daily with meals., Disp: 45 mL, Rfl: 5   Review of Systems  Constitutional: Negative.   Respiratory: Negative.   Cardiovascular: Positive for leg swelling.  Gastrointestinal: Positive for nausea.  Musculoskeletal: Positive for back pain.       Leg and feet pain   Psychiatric/Behavioral: Positive for sleep disturbance.       Depression     Social History   Tobacco Use  . Smoking status: Former Smoker    Packs/day: 0.50    Years: 20.00    Pack years: 10.00    Types: Cigarettes    Last attempt to quit: 04/25/2014    Years since quitting: 4.2  . Smokeless tobacco: Never Used  . Tobacco comment: quit  around june 2015  Substance Use Topics  . Alcohol use: No    Frequency: Never   Objective:   BP 136/90 (BP Location: Right Arm, Patient Position: Sitting, Cuff Size: Large)   Pulse 84   Temp 98.2 F (36.8 C) (Oral)  Vitals:   07/07/18 1518  BP: 136/90  Pulse: 84  Temp: 98.2 F (36.8 C)  TempSrc: Oral     Physical Exam  Constitutional: He is oriented to person, place, and time. He appears well-developed.  Initially patient is tachypnic, tachycardic, Pulse ox 62%, and somnolent.  After adjusting oxygen tank up to 3L (had gotten decreased to 0.5L), SpO2 improves to 95% and patient is more alert and stable  HENT:  Head: Normocephalic and atraumatic.  Right Ear: External ear normal.  Left Ear: External ear normal.  Nose: Nose  normal.  Mouth/Throat: Oropharynx is clear and moist.  Eyes: Pupils are equal, round, and reactive to light. Conjunctivae are normal. No scleral icterus.  Neck: Neck supple. No thyromegaly present.  Cardiovascular: Normal rate and intact distal pulses. An irregularly irregular rhythm present.  Murmur heard. Pulmonary/Chest: Effort normal and breath sounds normal.  Initially unable to hear breath sounds, but this improves after increasing oxygen level and lungs are clear without wheezes or crackles  Abdominal: Soft. He exhibits no distension. There is no tenderness.  Musculoskeletal: He exhibits edema (2+ bilaterally).  Lymphadenopathy:    He has no cervical adenopathy.  Neurological: He is alert and oriented to person, place, and time.  Skin: Skin is warm and dry. Capillary refill takes less than 2 seconds. No rash noted.  Psychiatric: His speech is normal and behavior is normal. His affect is blunt. Cognition and memory are normal. He exhibits a depressed mood. He expresses no homicidal and no suicidal ideation. He expresses no suicidal plans and no homicidal plans.  Vitals reviewed.       Assessment & Plan:   Problem List Items Addressed This Visit       Cardiovascular and Mediastinum   HTN (hypertension) (Chronic)    Above goal today, the patient did just have an acute episode of hypoxia which is likely related We will continue losartan and Coreg Recheck renal function panel He has follow-up with cardiology within the next few weeks      Relevant Medications   warfarin (COUMADIN) 5 MG tablet   Atrial fibrillation (HCC) - Primary (Chronic)    Chronic and stable Initially in RVR, but this resolves with oxygen therapy Continue current meds including anticoagulation See below for change in Coumadin dose Also followed by cardiology      Relevant Medications   warfarin (COUMADIN) 5 MG tablet   Other Relevant Orders   POCT INR (Completed)   Chronic combined systolic (congestive) and diastolic (congestive) heart failure (HCC)    Patient is followed by cardiology Lungs are clear, but he does continue to have edema in bilateral lower extremities Have advised multiple times on elevation and compression socks Continue torsemide 20 mg daily Check renal function panel      Relevant Medications   warfarin (COUMADIN) 5 MG tablet     Respiratory   Chronic respiratory failure (HCC)    Initially somnolent, hypoxemic, and tachypneic This resolved with increasing oxygen to 3 L He is breathing comfortably and lungs sound clear prior to leaving the clinic today        Endocrine   T2DM (type 2 diabetes mellitus) (HCC)    Remains uncontrolled, but A1c is improving greatly He has upcoming appointment with endocrinology in about 2 months Continue NovoLog at current dose Increase Levemir to 25 units twice daily Discussed low-carb diet Discussed that given his CKD, his insulin needs may change over time      Relevant Medications   insulin detemir (LEVEMIR) 100 UNIT/ML injection   insulin aspart (NOVOLOG FLEXPEN) 100 UNIT/ML FlexPen   Other Relevant Orders   POCT HgB A1C (Completed)   Diabetic neuropathy (HCC)    Chronic and  uncontrolled Continue gabapentin at current dose, but will reevaluate with new GFR collected today Has been unable to tolerate Lyrica and amitriptyline in the past Referral to pain management      Relevant Medications   insulin detemir (LEVEMIR) 100 UNIT/ML injection   insulin aspart (NOVOLOG FLEXPEN) 100 UNIT/ML FlexPen   Other Relevant Orders  Ambulatory referral to Pain Clinic     Genitourinary   CKD (chronic kidney disease) stage 3, GFR 30-59 ml/min (HCC)    Recheck renal function panel today He needs to call and set up a follow-up appointment with nephrology Avoid NSAIDs and other nephrotoxic medications      Relevant Orders   Renal Function Panel     Other   Aortic valve replaced (Chronic)    Followed by cardiology Decrease Coumadin dosing-skip today's dose and then take 5 mg daily except 7.5 mg on Mondays and Fridays Recheck INR in 2 weeks      Gout    Secondary to CKD Doing well without flare since starting low-dose allopurinol Need to be careful with titrating this given his CKD We are also monitoring his INR closely while he was on allopurinol Continue colchicine daily until 6 months after uric acid level is at goal Recheck renal function panel and uric acid level today      Relevant Orders   Uric acid   Adjustment disorder with depressed mood    Related to multiple chronic illnesses Is followed by psychiatry and therapist Discussed safety precautions      Chronic back pain    Chronic bilateral back pain and sciatica of the right leg He is strength is intact today He will be doing home health physical therapy He is taking max dose of gabapentin for his GFR Referral to pain management for further evaluation and management      Relevant Orders   Ambulatory referral to Pain Clinic   Frequent falls    Patient with significant debility and frequent falls after being in the hospital for multiple months after cardiac arrest Will refer to home health  physical therapy with a different agency      Relevant Orders   Ambulatory referral to Home Health    Other Visit Diagnoses    Debility       Relevant Orders   Ambulatory referral to Home Health   Need for influenza vaccination       Relevant Orders   Flu Vaccine QUAD 6+ mos PF IM (Fluarix Quad PF) (Completed)      Return in about 2 weeks (around 07/21/2018) for INR check and in 3 months for chronic disease f/u.  Addressed extensive list of chronic and acute medical problems today requiring extensive time in counseling and coordination of care.  Over half of this 40 minute visit were spent in counseling and coordinating care of multiple medical problems.  The entirety of the information documented in the History of Present Illness, Review of Systems and Physical Exam were personally obtained by me. Portions of this information were initially documented by Presley RaddleNikki Walston, CMA and reviewed by me for thoroughness and accuracy.    Erasmo DownerBacigalupo, Angela M, MD, MPH [facility] 07/07/2018 4:49 PM

## 2018-07-07 NOTE — Assessment & Plan Note (Signed)
Related to multiple chronic illnesses Is followed by psychiatry and therapist Discussed safety precautions

## 2018-07-07 NOTE — Assessment & Plan Note (Signed)
Above goal today, the patient did just have an acute episode of hypoxia which is likely related We will continue losartan and Coreg Recheck renal function panel He has follow-up with cardiology within the next few weeks

## 2018-07-07 NOTE — Assessment & Plan Note (Signed)
Chronic and stable Initially in RVR, but this resolves with oxygen therapy Continue current meds including anticoagulation See below for change in Coumadin dose Also followed by cardiology

## 2018-07-07 NOTE — Assessment & Plan Note (Signed)
Patient is followed by cardiology Lungs are clear, but he does continue to have edema in bilateral lower extremities Have advised multiple times on elevation and compression socks Continue torsemide 20 mg daily Check renal function panel

## 2018-07-07 NOTE — Assessment & Plan Note (Signed)
Remains uncontrolled, but A1c is improving greatly He has upcoming appointment with endocrinology in about 2 months Continue NovoLog at current dose Increase Levemir to 25 units twice daily Discussed low-carb diet Discussed that given his CKD, his insulin needs may change over time

## 2018-07-07 NOTE — Assessment & Plan Note (Signed)
Chronic bilateral back pain and sciatica of the right leg He is strength is intact today He will be doing home health physical therapy He is taking max dose of gabapentin for his GFR Referral to pain management for further evaluation and management

## 2018-07-07 NOTE — Assessment & Plan Note (Signed)
Followed by cardiology Decrease Coumadin dosing-skip today's dose and then take 5 mg daily except 7.5 mg on Mondays and Fridays Recheck INR in 2 weeks

## 2018-07-07 NOTE — Patient Instructions (Addendum)
Call [facility] [phone] to schedule appt  Skip today's dose of Coumadin Then start 5mg  daily, except 7.5 mg on Mondays and Fridays Recheck in 2 weeks  Continue Novolog Increase Levemir to 25 units twice daily  Someone will call about home health and pain management  We will call about labs and possibly adjust gabapentin dose

## 2018-07-08 LAB — RENAL FUNCTION PANEL
Albumin: 3.9 g/dL (ref 3.5–5.5)
BUN/Creatinine Ratio: 19 (ref 9–20)
BUN: 42 mg/dL — ABNORMAL HIGH (ref 6–24)
CO2: 33 mmol/L — ABNORMAL HIGH (ref 20–29)
CREATININE: 2.27 mg/dL — AB (ref 0.76–1.27)
Calcium: 9 mg/dL (ref 8.7–10.2)
Chloride: 98 mmol/L (ref 96–106)
GFR calc Af Amer: 38 mL/min/{1.73_m2} — ABNORMAL LOW (ref >59)
GFR, EST NON AFRICAN AMERICAN: 33 mL/min/{1.73_m2} — AB (ref >59)
GLUCOSE: 193 mg/dL — AB (ref 65–99)
Phosphorus: 4 mg/dL (ref 2.5–4.5)
Potassium: 6.1 mmol/L — ABNORMAL HIGH (ref 3.5–5.2)
SODIUM: 142 mmol/L (ref 134–144)

## 2018-07-08 LAB — URIC ACID: Uric Acid: 8.5 mg/dL (ref 3.7–8.6)

## 2018-07-11 ENCOUNTER — Telehealth: Payer: Self-pay

## 2018-07-11 ENCOUNTER — Institutional Professional Consult (permissible substitution): Payer: Medicare HMO | Admitting: Pulmonary Disease

## 2018-07-11 DIAGNOSIS — E875 Hyperkalemia: Secondary | ICD-10-CM

## 2018-07-11 NOTE — Telephone Encounter (Signed)
-----   Message from Erasmo Downer, MD sent at 07/11/2018  9:29 AM EDT ----- Kidney function similar to when last checked.  Recommend decreasing gabapentin to 600 mg twice daily dosing.  Uric acid level is still elevated.  Given kidney function, however, do not want to increase allopurinol dosing at this time.  Potassium is also elevated.  Make sure that patient is not taking any potassium supplements.  Take an extra dose of torsemide today.  Come back in tomorrow for renal function panel to recheck potassium.  Given these labs, it is very important that the patient follows up with nephrology.  Make sure they have called and scheduled an appointment.  Erasmo Downer, MD, MPH [facility] 07/11/2018 9:29 AM

## 2018-07-11 NOTE — Telephone Encounter (Signed)
LMTCB

## 2018-07-11 NOTE — Telephone Encounter (Signed)
Patient advised. Lab slip printed at the front desk for pick up.  

## 2018-07-18 ENCOUNTER — Ambulatory Visit
Admission: RE | Admit: 2018-07-18 | Discharge: 2018-07-18 | Disposition: A | Discharge status: Home / Self Care | Payer: Medicare HMO | Source: Ambulatory Visit | Attending: Orthopedic Surgery | Admitting: Orthopedic Surgery

## 2018-07-19 ENCOUNTER — Telehealth: Payer: Self-pay | Admitting: Family Medicine

## 2018-07-19 ENCOUNTER — Ambulatory Visit: Payer: Medicare HMO | Admitting: Internal Medicine

## 2018-07-19 DIAGNOSIS — J9621 Acute and chronic respiratory failure with hypoxia: Secondary | ICD-10-CM | POA: Diagnosis not present

## 2018-07-19 NOTE — Telephone Encounter (Signed)
Pt cancelled appointment with cardiologist and did not reschedule.Referred for I50.32 (ICD-10-CM) - Chronic diastolic heart failure (HCC) I48.2 (ICD-10-CM) - Chronic atrial fibrillation (HCC) Z95.2 (ICD-10-CM) - Aortic valve replaced

## 2018-07-19 NOTE — Progress Notes (Deleted)
New Outpatient Visit Date: 07/19/2018  Referring Provider: Erasmo Downer, MD [address] [place], [place] [address]  Chief Complaint: ***  HPI:  Alexander Alexander is a 47 y.o. male who is being seen today for the evaluation of heart failure and atrial fibrillation at the request of Erasmo Downer, MD. He has a history of systolic and diastolic heart failure, aortic stenosis status post chemical aortic valve replacement, chronic atrial fibrillation, chronic kidney disease stage III, hypertension, and cardiac arrest (suspected PEA) in 03/2018.  He has previously been seen by Drs. Gwen Pounds and Welton Flakes during hospitalizations earlier this year.  --------------------------------------------------------------------------------------------------  Cardiovascular History & Procedures: Cardiovascular Problems:  Chronic systolic and diastolic heart failure (LVEF 55 to 60% by echo in 03/2018)  Aortic stenosis status post mechanical aortic valve replacement  Chronic atrial fibrillation  Risk Factors:  Hypertension, hyperlipidemia, diabetes mellitus, male gender, and morbid obesity  Cath/PCI:  None available  CV Surgery:  Mechanical aortic valve replacement  EP Procedures and Devices:  None  Non-Invasive Evaluation(s):  TTE (04/07/2018): Moderate LVH.  LVEF 55 to 60%.  Mechanical AVR present with mean gradient of 15 mmHg.  Recent CV Pertinent Labs: Lab Results  Component Value Date   CHOL 175 12/09/2017   HDL 31 (L) 12/09/2017   LDLCALC 77 12/09/2017   TRIG 133 04/19/2018   CHOLHDL 5.6 (H) 12/09/2017   INR 5.8 (A) 07/07/2018   INR 5.5 (A) 06/28/2018   BNP 116.0 (H) 04/07/2018   K 6.1 (H) 07/07/2018   MG 1.8 06/06/2018   BUN 42 (H) 07/07/2018   CREATININE 2.27 (H) 07/07/2018    --------------------------------------------------------------------------------------------------  Past Medical History:  Diagnosis Date  . Allergy   . Anxiety   . Arrhythmia     . Arthritis   . Atrial fibrillation (HCC)   . CHF (congestive heart failure) (HCC)   . CKD (chronic kidney disease)   . Diabetes mellitus without complication (HCC)   . Diabetes mellitus, type II (HCC)   . Heart failure (HCC)   . Hyperlipidemia   . Hypertension   . Pancreatitis   . Sleep apnea     Past Surgical History:  Procedure Laterality Date  . ABLATION  08/01/2017  . IR GASTROSTOMY TUBE MOD SED  05/04/2018  . MECHANICAL AORTIC VALVE REPLACEMENT    . PEG PLACEMENT Left 04/28/2018   Procedure: PERCUTANEOUS ENDOSCOPIC GASTROSTOMY (PEG) PLACEMENT;  Surgeon: Pasty Spillers, MD;  Location: [facility] ENDOSCOPY;  Service: Endoscopy;  Laterality: Left;  . TRACHEOSTOMY TUBE PLACEMENT N/A 04/25/2018   Procedure: TRACHEOSTOMY;  Surgeon: Vernie Murders, MD;  Location: [facility] ORS;  Service: ENT;  Laterality: N/A;    No outpatient medications have been marked as taking for the 07/19/18 encounter (Appointment) with Joel Cowin, Cristal Deer, MD.    Allergies: Lexapro [escitalopram oxalate]  Social History   Tobacco Use  . Smoking status: Former Smoker    Packs/day: 0.50    Years: 20.00    Pack years: 10.00    Types: Cigarettes    Last attempt to quit: 04/25/2014    Years since quitting: 4.2  . Smokeless tobacco: Never Used  . Tobacco comment: quit around june 2015  Substance Use Topics  . Alcohol use: No    Frequency: Never  . Drug use: Not Currently    Types: Cocaine    Comment: January 28, 2017 quit Cocaine    Family History  Problem Relation Age of Onset  . CAD Mother   . Hypertension Mother   .  Hypertension Father   . Asthma Father   . Dementia Father   . Diabetes Brother   . Depression Brother   . Hypertension Brother   . Hypertension Brother   . Depression Brother   . Pancreatic cancer Paternal Aunt        several aunts and uncles with pancreatic cancer on paternal side  . Breast cancer Maternal Aunt   . Colon cancer Neg Hx   . Prostate cancer Neg Hx     Review of  Systems: A 12-system review of systems was performed and was negative except as noted in the HPI.  --------------------------------------------------------------------------------------------------  Physical Exam: There were no vitals taken for this visit.  General:  *** HEENT: No conjunctival pallor or scleral icterus. Moist mucous membranes. OP clear. Neck: Supple without lymphadenopathy, thyromegaly, JVD, or HJR. No carotid bruit. Lungs: Normal work of breathing. Clear to auscultation bilaterally without wheezes or crackles. Heart: Regular rate and rhythm without murmurs, rubs, or gallops. Non-displaced PMI. Abd: Bowel sounds present. Soft, NT/ND without hepatosplenomegaly Ext: No lower extremity edema. Radial, PT, and DP pulses are 2+ bilaterally Skin: Warm and dry without rash. Neuro: CNIII-XII intact. Strength and fine-touch sensation intact in upper and lower extremities bilaterally. Psych: Normal mood and affect.  EKG:  ***  Lab Results  Component Value Date   WBC 5.0 06/06/2018   HGB 12.4 (L) 06/06/2018   HCT 38.2 06/06/2018   MCV 83 06/06/2018   PLT 313 06/06/2018    Lab Results  Component Value Date   NA 142 07/07/2018   K 6.1 (H) 07/07/2018   CL 98 07/07/2018   CO2 33 (H) 07/07/2018   BUN 42 (H) 07/07/2018   CREATININE 2.27 (H) 07/07/2018   GLUCOSE 193 (H) 07/07/2018   ALT 10 06/06/2018    Lab Results  Component Value Date   CHOL 175 12/09/2017   HDL 31 (L) 12/09/2017   LDLCALC 77 12/09/2017   TRIG 133 04/19/2018   CHOLHDL 5.6 (H) 12/09/2017     --------------------------------------------------------------------------------------------------  ASSESSMENT AND PLAN: Cristal Deer Keyanni Whittinghill, MD 07/19/2018 8:21 AM

## 2018-07-20 ENCOUNTER — Ambulatory Visit: Payer: Self-pay | Admitting: Family Medicine

## 2018-07-21 ENCOUNTER — Ambulatory Visit (INDEPENDENT_AMBULATORY_CARE_PROVIDER_SITE_OTHER): Payer: Medicare HMO | Admitting: Family Medicine

## 2018-07-21 DIAGNOSIS — I482 Chronic atrial fibrillation, unspecified: Secondary | ICD-10-CM

## 2018-07-21 LAB — POCT INR
INR: 8 — AB (ref 2.0–3.0)
PT: 96

## 2018-07-21 NOTE — Patient Instructions (Signed)
Description   Hold 2 days then 5 mg daily, except 2.5 mg Mon and Friday

## 2018-07-24 ENCOUNTER — Institutional Professional Consult (permissible substitution): Payer: Medicare HMO | Admitting: Pulmonary Disease

## 2018-07-25 ENCOUNTER — Encounter: Payer: Self-pay | Admitting: Pulmonary Disease

## 2018-07-27 ENCOUNTER — Telehealth: Payer: Self-pay

## 2018-07-27 ENCOUNTER — Ambulatory Visit: Payer: Self-pay | Admitting: Family Medicine

## 2018-07-27 NOTE — Telephone Encounter (Signed)
Noted  Alexander Alexander, [name], MD, MPH [facility] 07/27/2018 2:03 PM

## 2018-07-27 NOTE — Telephone Encounter (Signed)
Patients wife called office very concerned because she states that husband was suppose to be seen in office this afternoon and his motor skills she states seem to be "off". Wife reported that she tried standing patient up to walk with walker and he collapsed on floor and reported no strength in his lower extremities, she states that patient is trembling and jerking around but denies any history of seizure or stroke. I can hear patient in background speaking clearly and breathing sounded normal, he denied chest pain or numbness or tingling of extremities. I advised wife that if patient is having those symptoms and is unable to stand and seems to be having jerking like movement that its best that they seek immediate medical attention at nearest ED and to contact 911. Wife understood and will be calling EMS.

## 2018-07-28 DIAGNOSIS — J9621 Acute and chronic respiratory failure with hypoxia: Secondary | ICD-10-CM | POA: Diagnosis not present

## 2018-07-28 DIAGNOSIS — M549 Dorsalgia, unspecified: Secondary | ICD-10-CM | POA: Diagnosis not present

## 2018-08-16 ENCOUNTER — Encounter: Payer: Self-pay | Admitting: Family Medicine

## 2018-08-16 ENCOUNTER — Ambulatory Visit (INDEPENDENT_AMBULATORY_CARE_PROVIDER_SITE_OTHER): Payer: Medicare HMO | Admitting: Family Medicine

## 2018-08-16 VITALS — BP 110/98 | HR 156 | Temp 98.5°F | Resp 15 | Wt 398.5 lb

## 2018-08-16 DIAGNOSIS — I5042 Chronic combined systolic (congestive) and diastolic (congestive) heart failure: Secondary | ICD-10-CM | POA: Diagnosis not present

## 2018-08-16 DIAGNOSIS — Z952 Presence of prosthetic heart valve: Secondary | ICD-10-CM

## 2018-08-16 DIAGNOSIS — N183 Chronic kidney disease, stage 3 unspecified: Secondary | ICD-10-CM

## 2018-08-16 DIAGNOSIS — H532 Diplopia: Secondary | ICD-10-CM

## 2018-08-16 DIAGNOSIS — Z7901 Long term (current) use of anticoagulants: Secondary | ICD-10-CM

## 2018-08-16 DIAGNOSIS — S0990XA Unspecified injury of head, initial encounter: Secondary | ICD-10-CM | POA: Diagnosis not present

## 2018-08-16 DIAGNOSIS — I482 Chronic atrial fibrillation, unspecified: Secondary | ICD-10-CM

## 2018-08-16 DIAGNOSIS — E1142 Type 2 diabetes mellitus with diabetic polyneuropathy: Secondary | ICD-10-CM

## 2018-08-16 LAB — POCT INR
INR: 1.7 — AB (ref 2.0–3.0)
Prothrombin Time: 20.9

## 2018-08-16 NOTE — Patient Instructions (Signed)
Decrease Gabapentin 300mg  three times daily  Increase Torsemide to 40mg  twice daily  Follow-up with Dr Thedore Mins

## 2018-08-16 NOTE — Progress Notes (Signed)
Patient: Alexander Alexander Male    DOB: September 29, 1971   47 y.o.   MRN: [id number] Visit Date: 08/16/2018  Today's Provider: Shirlee Latch, MD   Chief Complaint  Patient presents with  . Atrial Fibrillation  . Edema   Subjective:    HPI    Patient comes in office today for follow up visit and to address checking his Pt/INR. Wife reports that she has concerns of swelling in the patients lower extremities and abdomen for the past 3 months, he has only been taking torsemide 20 mg once daily.  He stopped his nighttime dose due to nocturia.  He has gained close to 50 pounds in the last 3 months.  He states that the bloating and swelling makes him feel full and he has significant lack of appetite. Was last seen by HF clinic in 11/2017 and put on torsemide 40mg  BID.  He has not seen his nephrologist in many months.  Wt Readings from Last 3 Encounters:  08/16/18 (!) 398 lb 8 oz (180.8 kg)  05/26/18 (!) 342 lb 6.4 oz (155.3 kg)  03/29/18 (!) 377 lb (171 kg)   Patient was last seen for an INR check on 07/21/2018.  His INR that day was 8.0.  He was advised to hold his Coumadin for 2 days and then resume 5 mg daily except for 2.5 mg on Mondays and Fridays.  It was discussed that if he had any signs of bleeding or any trauma that he would need to go to the emergency room.  He was supposed to come back in 1 week to have his INR checked, but today is the first day he returns to have it checked.  Patient is wife remain concerned about the jerking and twitching movements of his upper extremities.  This is been going on for months.  It was thought at last visit that this was likely related to his gabapentin dose in relation to his creatinine clearance and his gabapentin dose was decreased.  His wife states this fluctuates and what we are seeing today is actually pretty good compared to how it is at home.  She feels like it gets pretty violent at times.  Patient had a significant fall from standing about  4 to 5 weeks ago, around the time that his INR was last checked and was 8.0.  He hit his head on the edge of a table.  They are unsure whether or not he lost consciousness.  He developed instant blurred and double vision that has persisted since that time.  His wife wanted to take him to the emergency room, but he declined.  He denies any other neurologic symptoms other than has blurred/double vision.  He was planning to see an optometrist at [facility] to have his eyes checked.  He has fallen another 4-5 times in the 2 days after the initial fall.  His fall seem to have subsided currently.  He is also been getting what he and his wife called hallucinations daily since that time.  These typically occur at night.  He will be in his bedroom in a corner trying to get out and telling his wife that he is trying to get into his bedroom even though he already is in his bedroom.  He has urinated in his living room because he thought it was the bathroom.    Of note, I witnessed an episode in clinic today where his wife handed him his wallet and he  keeps insisting to her that is not his wallet and becoming angry that his wallet was in another place even though it clearly is his wallet.  Also of note, he does have a mechanical aortic valve that is [employer] an MRI safe.  He has the MRI card in his wallet.  He and his wife state that due to difficulties with moving him and transportation and finances, he has not seen any of his specialist within the last several months.   Allergies  Allergen Reactions  . Lexapro [Escitalopram Oxalate] Swelling     Current Outpatient Medications:  .  acetaminophen (TYLENOL) 325 MG tablet, Take 650 mg by mouth every 6 (six) hours as needed., Disp: , Rfl:  .  allopurinol (ZYLOPRIM) 100 MG tablet, Take 0.5 tablets (50 mg total) by mouth daily., Disp: 15 tablet, Rfl: 3 .  ALPRAZolam (XANAX) 0.5 MG tablet, Take 1 tablet (0.5 mg total) by mouth 2 (two) times daily., Disp: 60 tablet,  Rfl: 1 .  colchicine 0.6 MG tablet, Take 1 tablet (0.6 mg total) by mouth daily., Disp: 30 tablet, Rfl: 11 .  gabapentin (NEURONTIN) 600 MG tablet, Take 1 tablet (600 mg total) by mouth 3 (three) times daily., Disp: 180 tablet, Rfl: 3 .  insulin aspart (NOVOLOG FLEXPEN) 100 UNIT/ML FlexPen, Inject 35 Units into the skin 3 (three) times daily with meals., Disp: 45 mL, Rfl: 5 .  insulin detemir (LEVEMIR) 100 UNIT/ML injection, Inject 0.25 mLs (25 Units total) into the skin 2 (two) times daily., Disp: 40 mL, Rfl: 5 .  metoprolol (TOPROL-XL) 200 MG 24 hr tablet, Take 200 mg by mouth daily. , Disp: , Rfl: 0 .  ondansetron (ZOFRAN) 4 MG tablet, Take 1 tablet (4 mg total) by mouth daily as needed for nausea or vomiting., Disp: 30 tablet, Rfl: 1 .  oxyCODONE-acetaminophen (PERCOCET) 7.5-325 MG tablet, Take 1 tablet by mouth every 4 (four) hours as needed for severe pain., Disp: 28 tablet, Rfl: 0 .  torsemide (DEMADEX) 20 MG tablet, Take 1 tablet (20 mg total) by mouth daily., Disp: 30 tablet, Rfl: 2 .  warfarin (COUMADIN) 5 MG tablet, Take 5 mg PO daily, except 7.5mg  on mondays and fridays, Disp: , Rfl:  .  fluvoxaMINE (LUVOX) 25 MG tablet, Take 1 tablet (25 mg total) by mouth at bedtime. (Patient not taking: Reported on 08/16/2018), Disp: 15 tablet, Rfl: 0  Review of Systems  Constitutional: Positive for activity change, appetite change, fatigue and unexpected weight change. Negative for fever.  HENT: Negative.   Eyes: Positive for visual disturbance. Negative for photophobia, pain, discharge, redness and itching.  Respiratory: Negative.   Cardiovascular: Positive for leg swelling. Negative for chest pain and palpitations.  Gastrointestinal: Positive for abdominal distention and diarrhea. Negative for abdominal pain, anal bleeding, blood in stool, constipation, nausea, rectal pain and vomiting.  Endocrine: Negative.   Genitourinary: Positive for frequency.  Musculoskeletal: Positive for arthralgias,  back pain, gait problem and myalgias.  Skin: Negative.   Allergic/Immunologic: Negative.   Neurological: Positive for tremors and weakness. Negative for syncope, speech difficulty, light-headedness, numbness and headaches.  Psychiatric/Behavioral: Positive for agitation, confusion, dysphoric mood and hallucinations. Negative for self-injury, sleep disturbance and suicidal ideas. The patient is nervous/anxious.     Social History   Tobacco Use  . Smoking status: Former Smoker    Packs/day: 0.50    Years: 20.00    Pack years: 10.00    Types: Cigarettes    Last attempt to quit: 04/25/2014  Years since quitting: 4.3  . Smokeless tobacco: Never Used  . Tobacco comment: quit around june 2015  Substance Use Topics  . Alcohol use: No    Frequency: Never   Objective:   BP (!) 110/98   Pulse (!) 156   Temp 98.5 F (36.9 C) (Oral)   Resp 15   Wt (!) 398 lb 8 oz (180.8 kg)   SpO2 (!) 76%   BMI 55.58 kg/m  Vitals:   08/16/18 1515  BP: (!) 110/98  Pulse: (!) 156  Resp: 15  Temp: 98.5 F (36.9 C)  TempSrc: Oral  SpO2: (!) 76%  Weight: (!) 398 lb 8 oz (180.8 kg)     Physical Exam  Constitutional: He is oriented to person, place, and time. He appears lethargic.  Morbidly obese, sitting in wheelchair Intermittently somnolent  HENT:  Head: Normocephalic and atraumatic.  Right Ear: External ear normal.  Left Ear: External ear normal.  Nose: Nose normal.  Mouth/Throat: Oropharynx is clear and moist. No oropharyngeal exudate.  Eyes: Pupils are equal, round, and reactive to light. Conjunctivae and EOM are normal. No scleral icterus.  Eye malalignment that is new  Neck: Neck supple. No thyromegaly present.  Cardiovascular: An irregularly irregular rhythm present. Tachycardia present.  Pulmonary/Chest: Effort normal and breath sounds normal. No respiratory distress. He has no wheezes.  Abdominal: He exhibits distension. There is no tenderness. There is no guarding.    Musculoskeletal: He exhibits edema (2-3+ bilaterally, diffusely).  Lymphadenopathy:    He has no cervical adenopathy.  Neurological: He is oriented to person, place, and time. He appears lethargic. He displays tremor (jerking movements intermittently of UEs). A cranial nerve deficit is present.  EOMI, but eyes are malaligned  Skin: Skin is warm and dry. Capillary refill takes less than 2 seconds. No rash noted.  Vitals reviewed.       Assessment & Plan:   Problem List Items Addressed This Visit      Cardiovascular and Mediastinum   Atrial fibrillation (HCC) - Primary (Chronic)    Chronic Currently in RVR, but patient refuses EKG and to go to the emergency room Continue current meds for anticoagulation See below for change in Coumadin dose Also followed by cardiology-of note, discussed with patient the need to see all of his specialists given that he has multiple organ systems with significant medical issues      Relevant Orders   POCT INR (Completed)   Chronic combined systolic (congestive) and diastolic (congestive) heart failure (HCC)    Patient is followed by cardiology and heart failure clinic, but has not been seeing a specialist for several months His lungs are clear, but he has significant anasarca and abdominal distention Recheck renal function panel Increase torsemide to 40 mg twice daily Initially hypoxic, but on turning back up his oxygen, his oxygen level stabilized Suggested to patient and his wife that he may need admission for IV diuretics, but he is adamant that he will not go to the hospital again      Relevant Orders   Comprehensive metabolic panel (Completed)   CBC (Completed)     Endocrine   Diabetic neuropathy (HCC)    Chronic and uncontrolled Continues to complain about his foot pain Has been unable to tolerate Lyrica and amitriptyline in the past Decrease his gabapentin dose again as I believe that this is causing his jerking and twitching  movements as the metabolites are likely building up due to his poor creatinine clearance Has been  referred to pain management in the past        Genitourinary   CKD (chronic kidney disease) stage 3, GFR 30-59 ml/min (HCC)    Recheck renal function panel He needs to call and set up a follow-up appointment with nephrology Avoid NSAIDs and other nephrotoxic medications Dose adjusted gabapentin as above      Relevant Orders   Comprehensive metabolic panel (Completed)   CBC (Completed)     Other   Aortic valve replaced (Chronic)    Followed by cardiology INR seems to be having rapid swings which may be related to his poor renal function I am concerned, as below, about a possible intracranial hemorrhage given that he had a traumatic fall with an INR of 8 and developed sudden neuro deficit His mechanical valve is reportedly MRI safe, but this will need to be investigated further prior to getting an MRI We will increase his Coumadin dose as he is subtherapeutic today to 5 mg daily except for 2.5 mg on Mondays Recheck INR in 2 weeks He has refused Lovenox shots for bridging in the past      Morbid obesity (HCC)    Again discussed with the patient and his wife that his morbid obesity comes with multiple comorbidities and several different organ systems He has been referred for bariatric surgery previously and has not gone He is unable to get an MRI and [facility] will need to go to [place] [facility] due to table weight limits      Head trauma    Patient with traumatic fall with head injury 4 weeks ago around which time his INR was 8 He has not seek medical care since that time I am concerned that he could have had an intracranial or subdural bleed at that time I am especially concerned given that he had sudden onset of a neuro deficit and his eyes are malaligned today and he continues to have double vision after that fall I would like to get a stat MRI of his head We will need to  investigate his mechanical valve a little closer, but is reportedly MRI safe and he has had MRIs before Discussed the significance of this with the patient and his wife and advised that perhaps he could have this and his heart failure exacerbation and his A. fib with RVR all taking care of it once if he would go and be admitted to the hospital, but he adamantly refuses and states that he would rather die than go to the hospital again      Relevant Orders   MR Brain Wo Contrast    Other Visit Diagnoses    Chronic anticoagulation       Relevant Orders   CBC (Completed)   MR Brain Wo Contrast   Double vision       Relevant Orders   MR Brain Wo Contrast      Return in about 1 week (around 08/23/2018) for f/u (40 min).  Addressed extensive list of chronic and acute medical problems today requiring extensive time in counseling and coordination of care.  Over half of this 45 minute visit were spent in counseling and coordinating care of multiple medical problems.   The entirety of the information documented in the History of Present Illness, Review of Systems and Physical Exam were personally obtained by me. Portions of this information were initially documented by Nadene Rubins, CMA and reviewed by me for thoroughness and accuracy.    Erasmo Downer,  MD, MPH [facility]  5:10 PM

## 2018-08-17 ENCOUNTER — Telehealth: Payer: Self-pay | Admitting: Family Medicine

## 2018-08-17 ENCOUNTER — Telehealth: Payer: Self-pay

## 2018-08-17 DIAGNOSIS — S0990XA Unspecified injury of head, initial encounter: Secondary | ICD-10-CM | POA: Insufficient documentation

## 2018-08-17 LAB — COMPREHENSIVE METABOLIC PANEL
A/G RATIO: 0.8 — AB (ref 1.2–2.2)
ALBUMIN: 3.8 g/dL (ref 3.5–5.5)
ALK PHOS: 135 IU/L — AB (ref 39–117)
ALT: 7 IU/L (ref 0–44)
AST: 18 IU/L (ref 0–40)
BUN / CREAT RATIO: 13 (ref 9–20)
BUN: 27 mg/dL — ABNORMAL HIGH (ref 6–24)
Bilirubin Total: 2 mg/dL — ABNORMAL HIGH (ref 0.0–1.2)
CO2: 32 mmol/L — ABNORMAL HIGH (ref 20–29)
Calcium: 8.9 mg/dL (ref 8.7–10.2)
Chloride: 99 mmol/L (ref 96–106)
Creatinine, Ser: 2.1 mg/dL — ABNORMAL HIGH (ref 0.76–1.27)
GFR calc Af Amer: 41 mL/min/{1.73_m2} — ABNORMAL LOW (ref >59)
GFR calc non Af Amer: 36 mL/min/{1.73_m2} — ABNORMAL LOW (ref >59)
GLOBULIN, TOTAL: 4.6 g/dL — AB (ref 1.5–4.5)
Glucose: 149 mg/dL — ABNORMAL HIGH (ref 65–99)
POTASSIUM: 4.5 mmol/L (ref 3.5–5.2)
SODIUM: 148 mmol/L — AB (ref 134–144)
Total Protein: 8.4 g/dL (ref 6.0–8.5)

## 2018-08-17 LAB — CBC
Hematocrit: 37.1 % — ABNORMAL LOW (ref 37.5–51.0)
Hemoglobin: 11.5 g/dL — ABNORMAL LOW (ref 13.0–17.7)
MCH: 27.1 pg (ref 26.6–33.0)
MCHC: 31 g/dL — ABNORMAL LOW (ref 31.5–35.7)
MCV: 87 fL (ref 79–97)
PLATELETS: 141 10*3/uL — AB (ref 150–450)
RBC: 4.25 x10E6/uL (ref 4.14–5.80)
RDW: 18.5 % — AB (ref 12.3–15.4)
WBC: 5.3 10*3/uL (ref 3.4–10.8)

## 2018-08-17 NOTE — Telephone Encounter (Signed)
Left a detailed message on home voicemail advising patient that labs were stable and to call back if he has any further questions or concerns. Okay to leave a message per DPR.

## 2018-08-17 NOTE — Assessment & Plan Note (Signed)
Recheck renal function panel He needs to call and set up a follow-up appointment with nephrology Avoid NSAIDs and other nephrotoxic medications Dose adjusted gabapentin as above

## 2018-08-17 NOTE — Assessment & Plan Note (Signed)
Chronic Currently in RVR, but patient refuses EKG and to go to the emergency room Continue current meds for anticoagulation See below for change in Coumadin dose Also followed by cardiology-of note, discussed with patient the need to see all of his specialists given that he has multiple organ systems with significant medical issues

## 2018-08-17 NOTE — Assessment & Plan Note (Signed)
Followed by cardiology INR seems to be having rapid swings which may be related to his poor renal function I am concerned, as below, about a possible intracranial hemorrhage given that he had a traumatic fall with an INR of 8 and developed sudden neuro deficit His mechanical valve is reportedly MRI safe, but this will need to be investigated further prior to getting an MRI We will increase his Coumadin dose as he is subtherapeutic today to 5 mg daily except for 2.5 mg on Mondays Recheck INR in 2 weeks He has refused Lovenox shots for bridging in the past

## 2018-08-17 NOTE — Assessment & Plan Note (Signed)
Patient with traumatic fall with head injury 4 weeks ago around which time his INR was 8 He has not seek medical care since that time I am concerned that he could have had an intracranial or subdural bleed at that time I am especially concerned given that he had sudden onset of a neuro deficit and his eyes are malaligned today and he continues to have double vision after that fall I would like to get a stat MRI of his head We will need to investigate his mechanical valve a little closer, but is reportedly MRI safe and he has had MRIs before Discussed the significance of this with the patient and his wife and advised that perhaps he could have this and his heart failure exacerbation and his A. fib with RVR all taking care of it once if he would go and be admitted to the hospital, but he adamantly refuses and states that he would rather die than go to the hospital again

## 2018-08-17 NOTE — Assessment & Plan Note (Signed)
Chronic and uncontrolled Continues to complain about his foot pain Has been unable to tolerate Lyrica and amitriptyline in the past Decrease his gabapentin dose again as I believe that this is causing his jerking and twitching movements as the metabolites are likely building up due to his poor creatinine clearance Has been referred to pain management in the past

## 2018-08-17 NOTE — Telephone Encounter (Signed)
-----   Message from Erasmo Downer, MD sent at  10:12 AM EDT ----- Stable labs  Bacigalupo, Marzella Schlein, MD, MPH [facility]  10:12 AM

## 2018-08-17 NOTE — Assessment & Plan Note (Signed)
Again discussed with the patient and his wife that his morbid obesity comes with multiple comorbidities and several different organ systems He has been referred for bariatric surgery previously and has not gone He is unable to get an MRI and [place] [place] will need to go to [place] imaging due to table weight limits

## 2018-08-17 NOTE — Assessment & Plan Note (Signed)
Patient is followed by cardiology and heart failure clinic, but has not been seeing a specialist for several months His lungs are clear, but he has significant anasarca and abdominal distention Recheck renal function panel Increase torsemide to 40 mg twice daily Initially hypoxic, but on turning back up his oxygen, his oxygen level stabilized Suggested to patient and his wife that he may need admission for IV diuretics, but he is adamant that he will not go to the hospital again

## 2018-08-17 NOTE — Telephone Encounter (Signed)
An officer from the [employer] calls to alert [place] that patient is deceased.  He was last seen living at 6 AM when his wife left for work.  When she returned to the home at 11:50 AM, she found him deceased.  There are no signs of foul play or anything suspicious.  Informed the police officer that we will complete death certificate when available  Damarcus Reggio, Marzella Schlein, MD, MPH [facility]  5:12 PM

## 2018-08-18 DIAGNOSIS — J9621 Acute and chronic respiratory failure with hypoxia: Secondary | ICD-10-CM | POA: Diagnosis not present

## 2018-08-28 ENCOUNTER — Other Ambulatory Visit: Payer: Self-pay | Admitting: Family Medicine

## 2018-09-08 DEATH — deceased

## 2018-10-13 ENCOUNTER — Ambulatory Visit: Payer: Self-pay | Admitting: Family Medicine

## 2019-07-18 IMAGING — DX DG CHEST 1V PORT
1 series · 1 of 1 positions shown · non-contrast
Comparison: April 07, 2018 study obtained earlier in the day

CLINICAL DATA: Central catheter placement.  Hypoxia.

EXAM:
PORTABLE CHEST 1 VIEW

[chest ap]
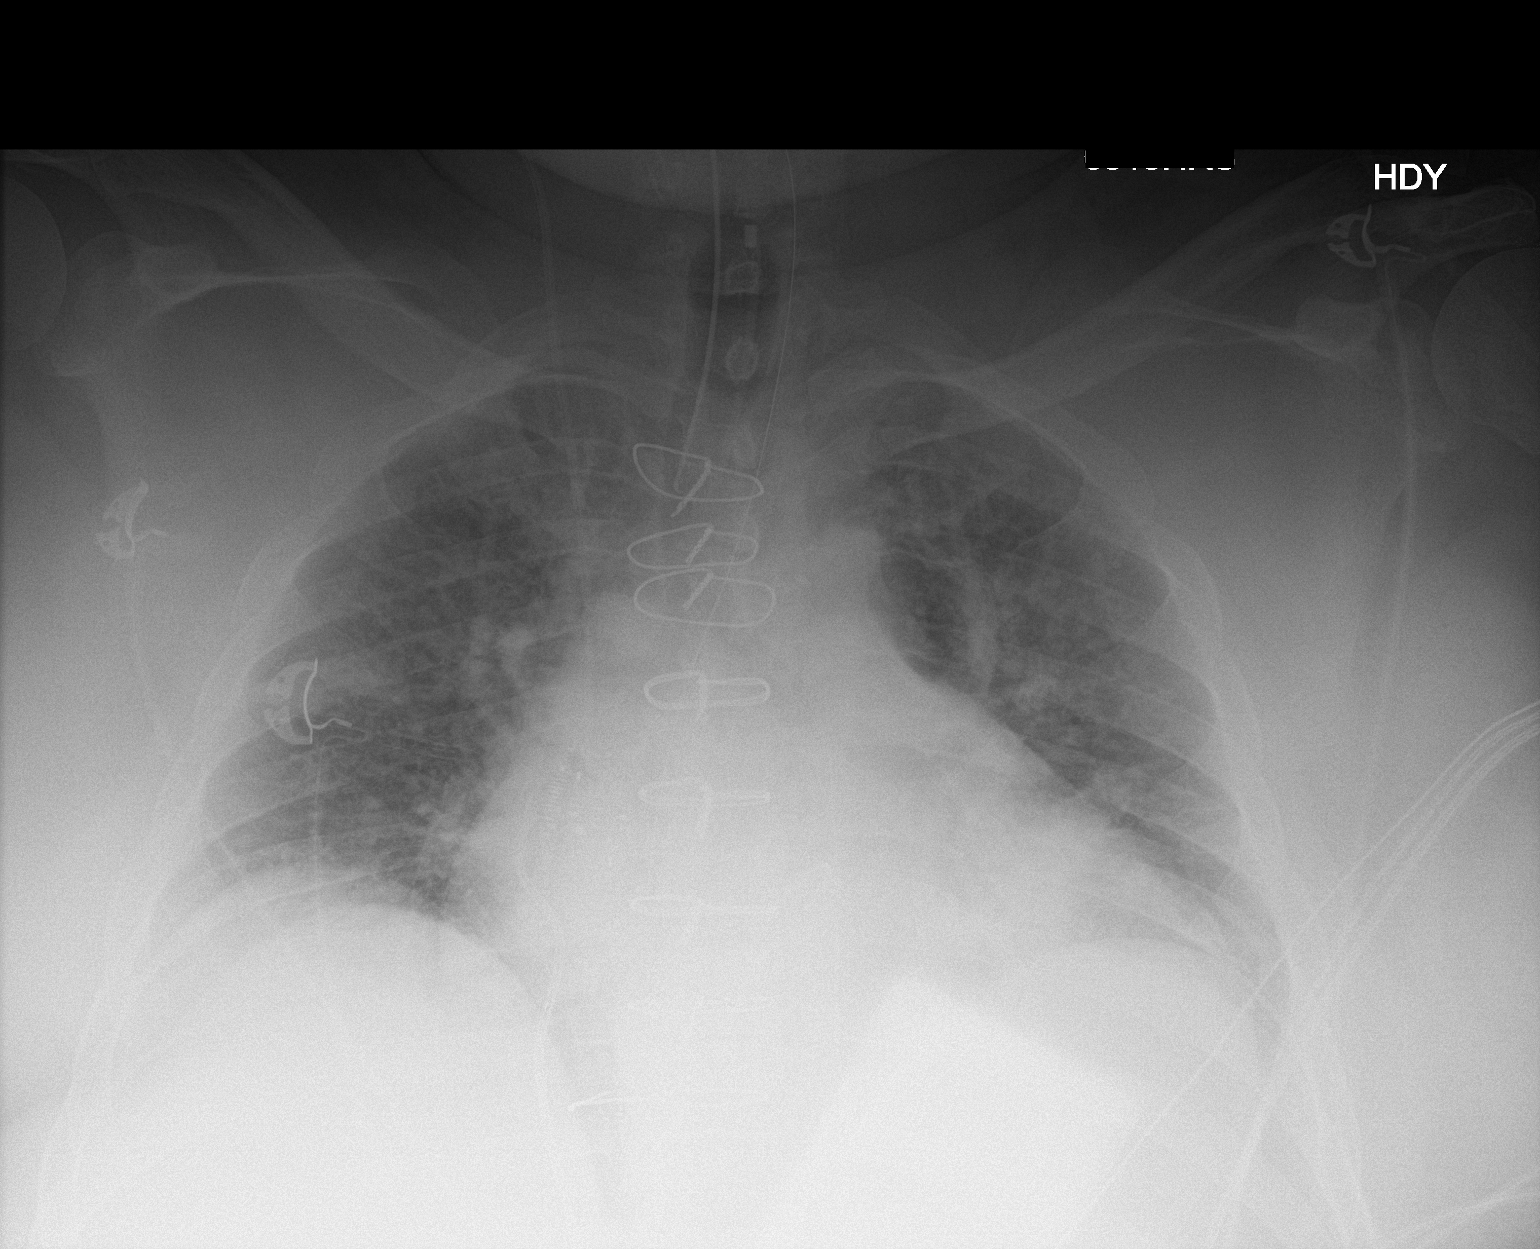

[1 of 1 positions shown; findings below may reference images not displayed]

FINDINGS: Right jugular catheter tip is in the superior vena cava near the
cavoatrial junction. Endotracheal tube tip is 4.0 cm above the
carina. Nasogastric tube tip and side port are below the diaphragm.
No pneumothorax. There is bibasilar atelectasis. Lungs elsewhere are
clear. There is cardiomegaly with pulmonary venous hypertension. No
adenopathy. No bone lesions. Patient is status post median
sternotomy with aortic valve replacement.
IMPRESSION: Tube and catheter positions as described without pneumothorax. There
is pulmonary vascular congestion. Bibasilar atelectasis present. No
consolidation evident.

## 2019-07-18 IMAGING — DX DG CHEST 1V
1 series · 1 of 1 positions shown · non-contrast
Comparison: 12/06/2017 chest radiograph

CLINICAL DATA: 46 y/o  M; intubated.

EXAM:
CHEST  1 VIEW

[chest ap]
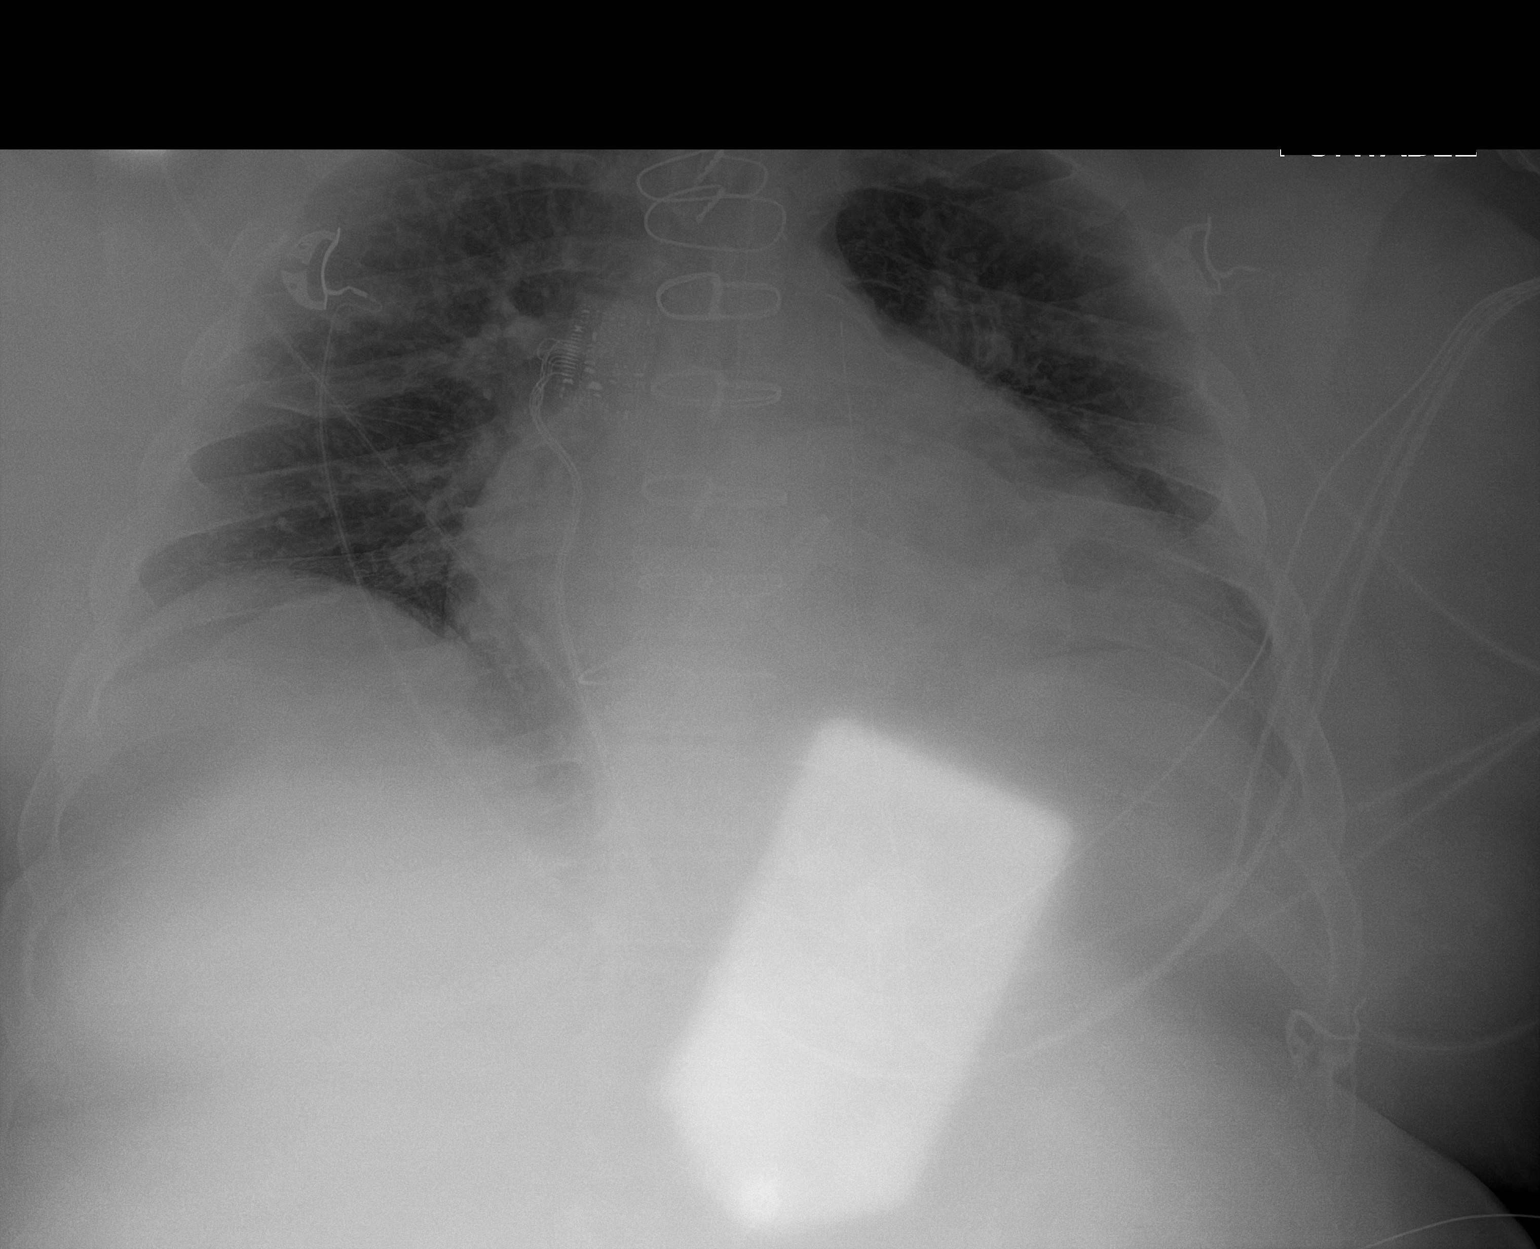

[1 of 1 positions shown; findings below may reference images not displayed]

FINDINGS: Stable cardiomegaly given projection and technique. Transcutaneous
pacing pads. Post median sternotomy with wires in alignment. Low
lung volumes. Pulmonary vascular congestion. No focal consolidation.
Endotracheal tube tip 2.4 cm above the carina. Enteric tube tip
appears to project over the lower esophagus, advancement
recommended.
IMPRESSION: 1. Endotracheal tube tip 2.4 cm above the carina.
2. Cardiomegaly and pulmonary vascular congestion.
3. Enteric tube tip appears to project over the lower esophagus,
advancement recommended.

By: Lorenz Jumper M.D.

## 2019-07-19 IMAGING — DX DG CHEST 1V PORT
1 series · 1 of 1 positions shown · non-contrast
Comparison: 04/07/2018 and older exams.

CLINICAL DATA: Followup pneumonia.

EXAM:
PORTABLE CHEST 1 VIEW

[chest ap]
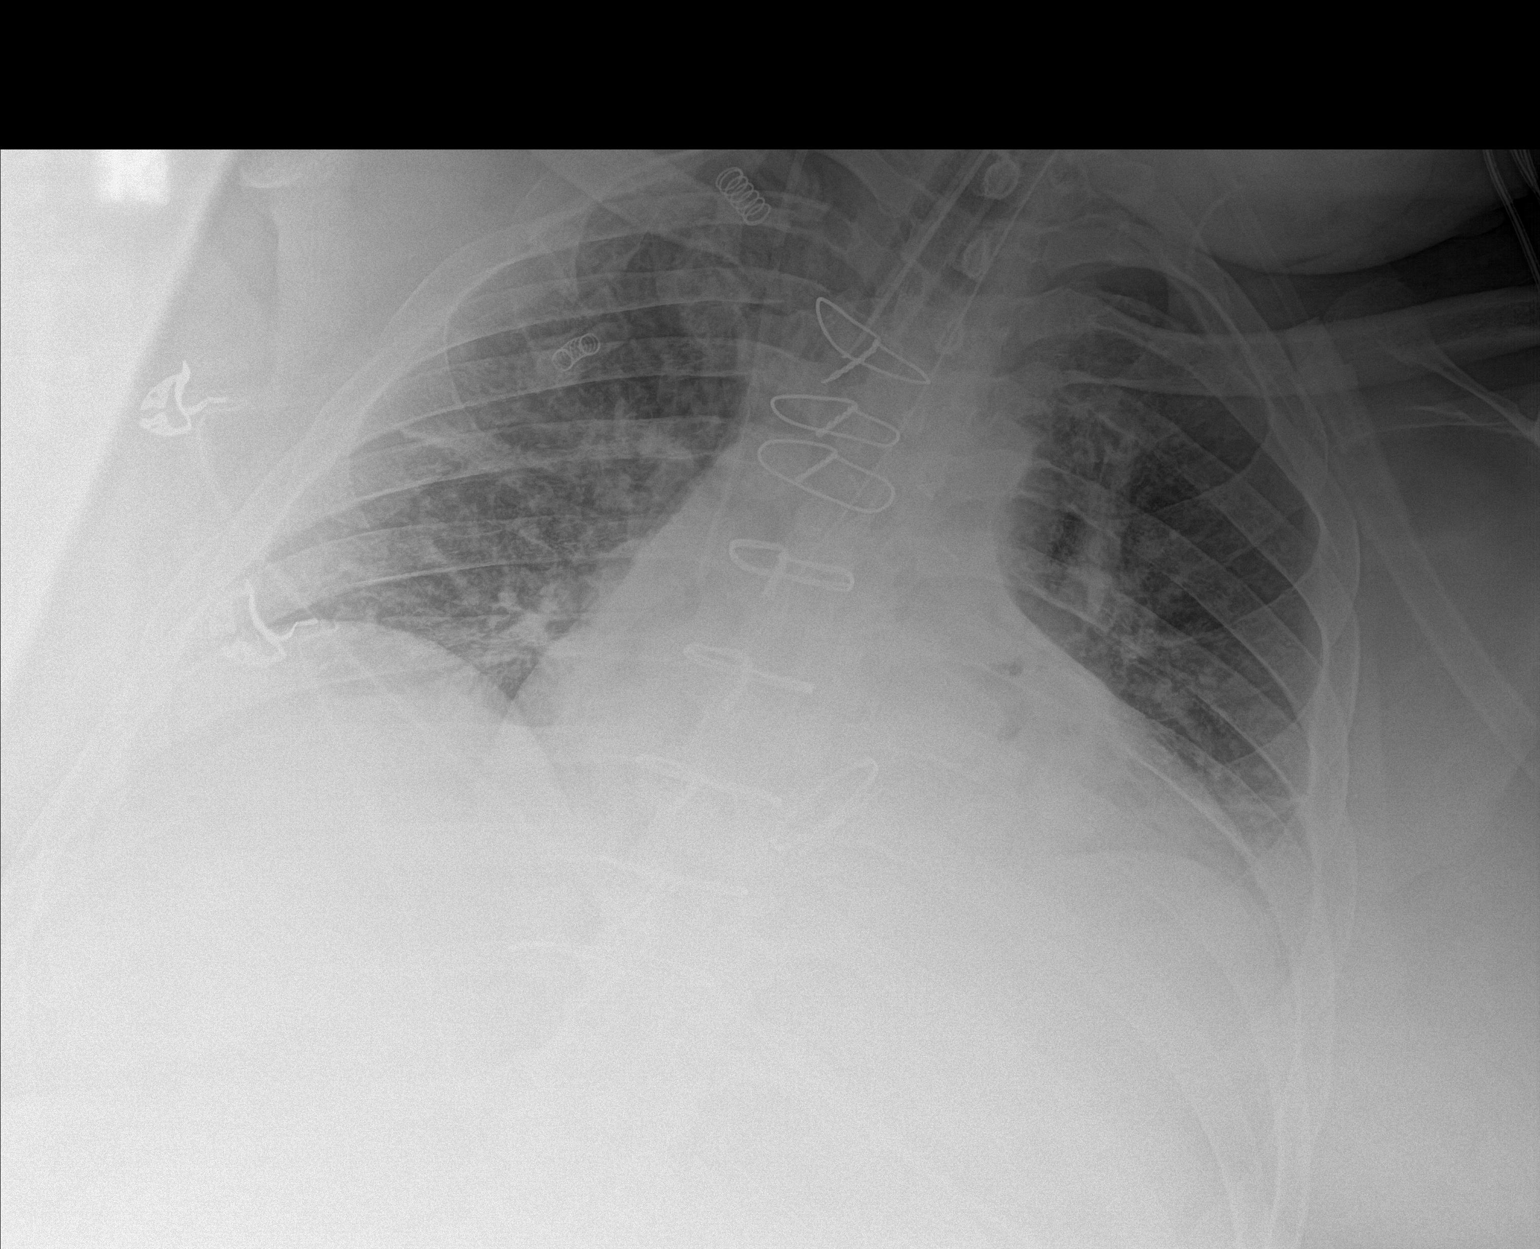

[1 of 1 positions shown; findings below may reference images not displayed]

FINDINGS: Lung volumes are low. This accentuates the appearance of vascular
congestion and interstitial prominence. There is mild linear
atelectasis in the right mid lung, stable from the prior exam.
Bilateral lung base opacity is most likely atelectasis. Pneumonia is
possible. No overt pulmonary edema. Is

Stable changes from prior cardiac surgery. Cardiac silhouette is
mildly enlarged.

Endotracheal tube, right internal jugular central venous line and
nasal/orogastric tube are stable.
IMPRESSION: 1. No significant change from the previous day's study.
2. Vascular congestion with interstitial prominence, but without
overt pulmonary edema.
3. Basilar atelectasis.  Cannot exclude pneumonia.

## 2019-07-20 IMAGING — CT CT HEAD W/O CM
3 series · 15 of 47 positions shown, 18 images · non-contrast
Comparison: 04/07/2018.

CLINICAL DATA: 46-year-old with acute mental status changes after
cardiac arrest. Possible new onset seizures.

EXAM:
CT HEAD WITHOUT CONTRAST
TECHNIQUE: Contiguous axial images were obtained from the base of the skull
through the vertex without intravenous contrast.

[Series 2: head wo · axial · 0.46mm/px · z∈[-113,+22]mm · 9 of 33 slices shown, 12 images]
[im 3/33  brain]
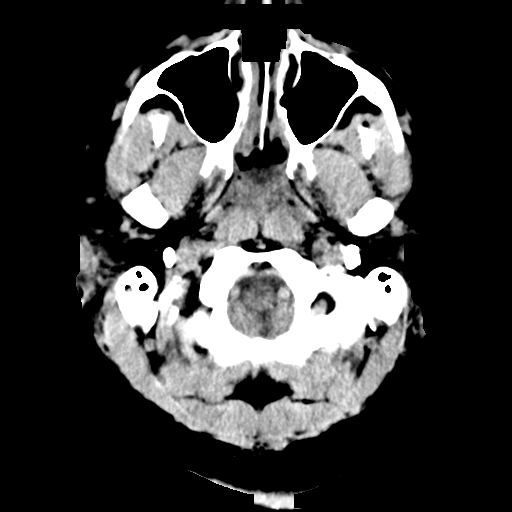
[im 3/33  bone]
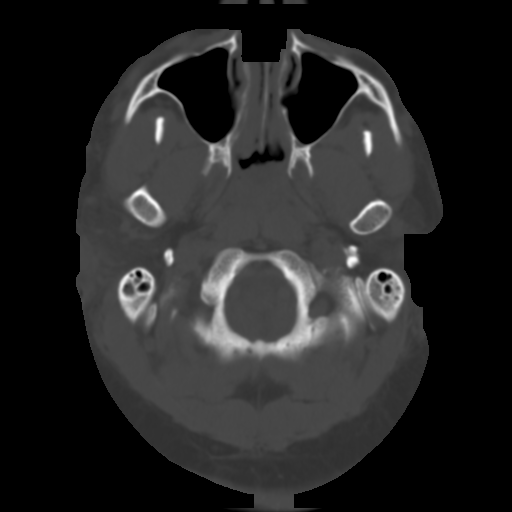
[im 6/33  brain]
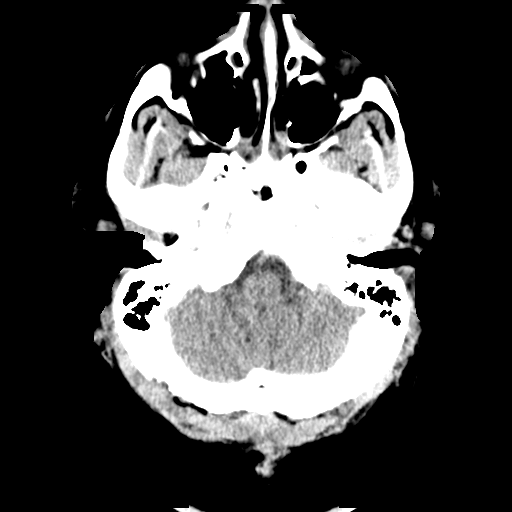
[im 9/33  brain]
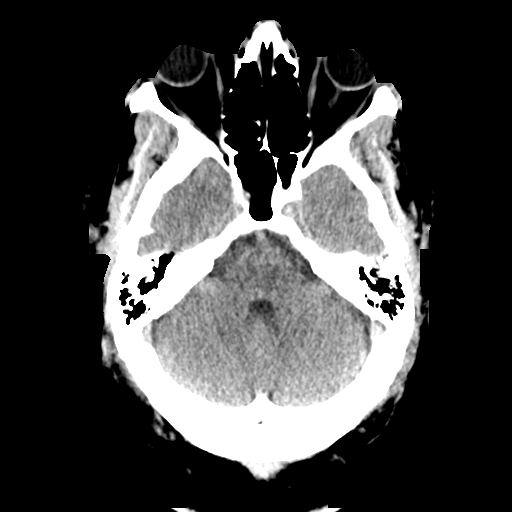
[im 13/33  brain]
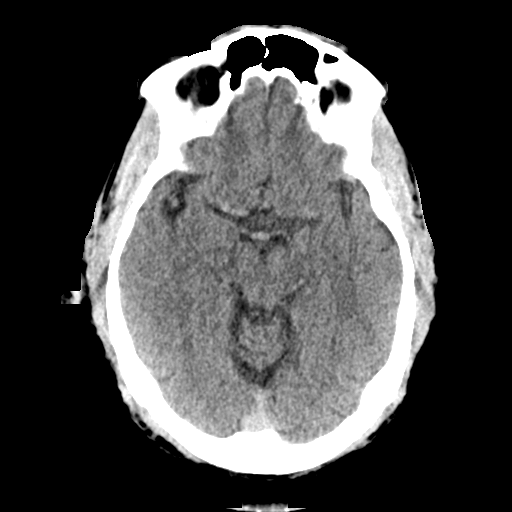
[im 17/33  brain]
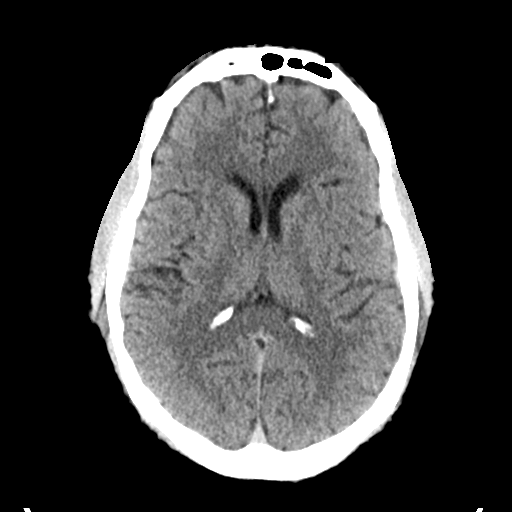
[im 17/33  bone]
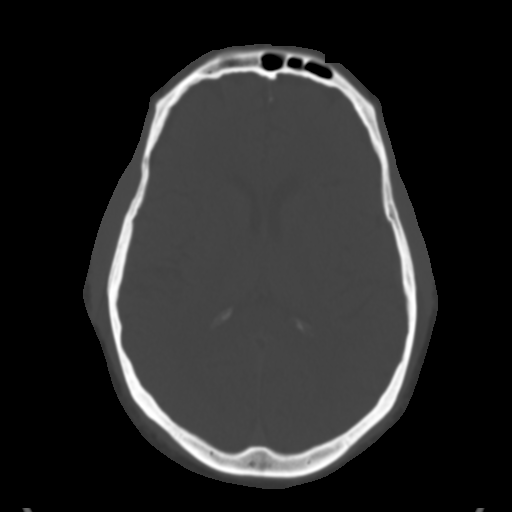
[im 20/33  brain]
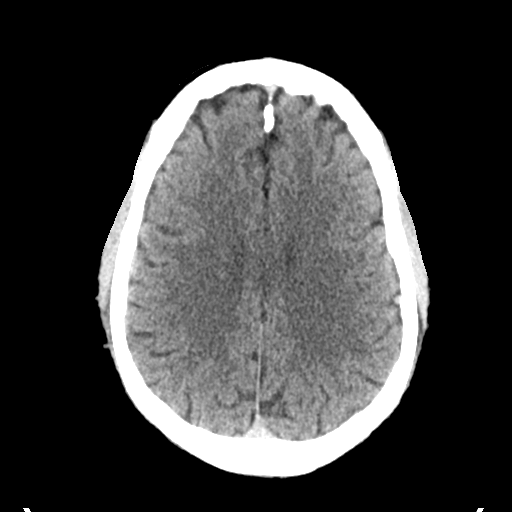
[im 24/33  brain]
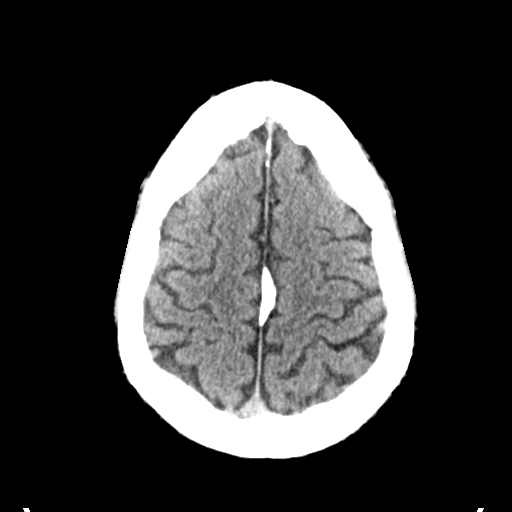
[im 27/33  brain]
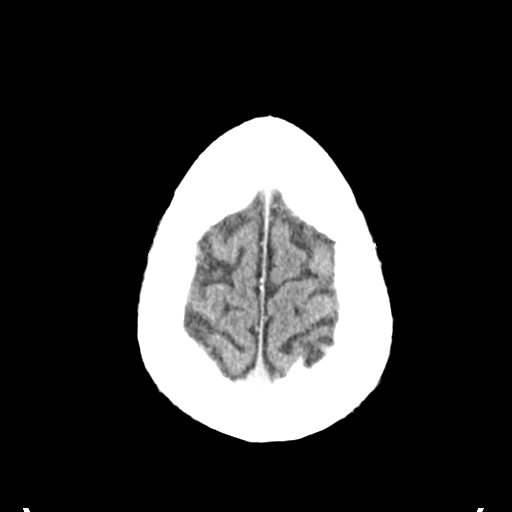
[im 30/33  brain]
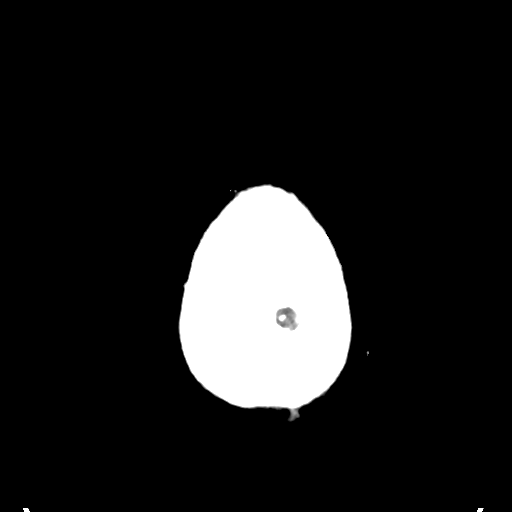
[im 30/33  bone]
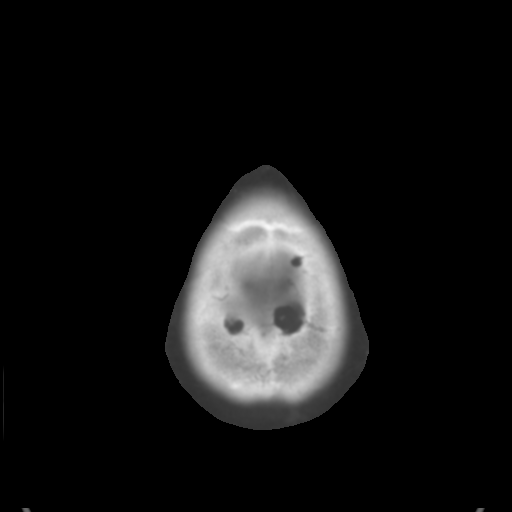

[Series 4: coronal soft tissue · coronal · 0.33mm/px · 3 of 69 slices shown]
[im 23/69  brain]
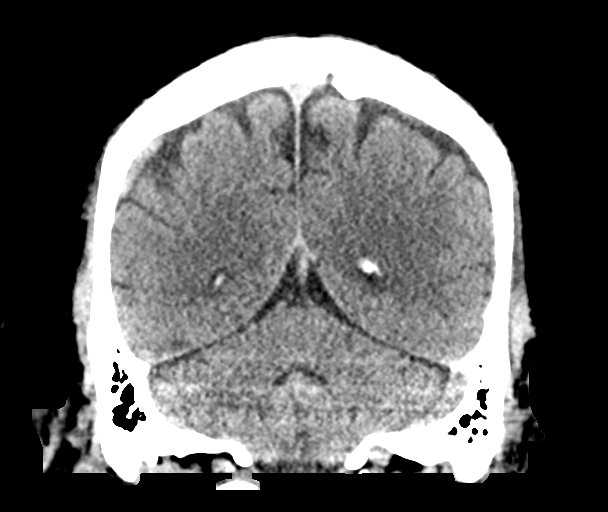
[im 31/69  brain]
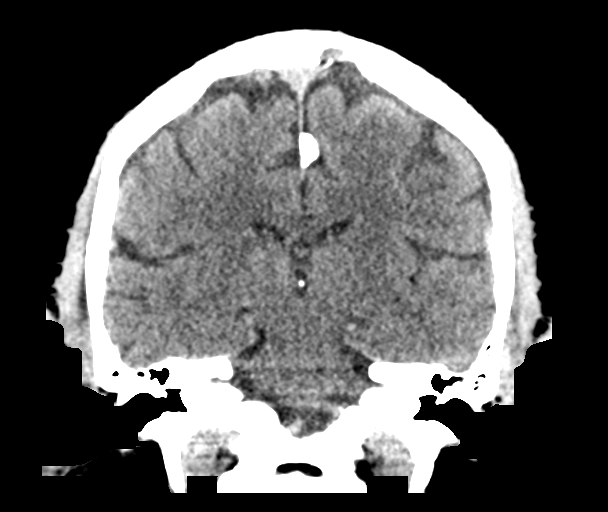
[im 38/69  brain]
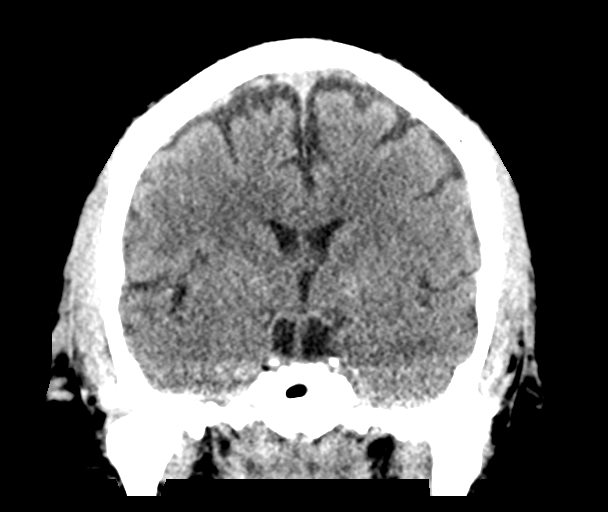

[Series 5: sagittal soft tissue · sagittal · 0.33mm/px · 3 of 58 slices shown]
[im 20/58  brain]
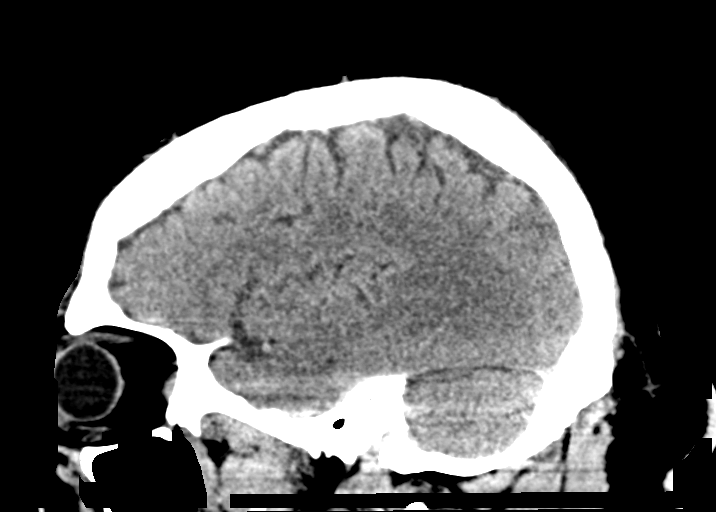
[im 29/58  brain]
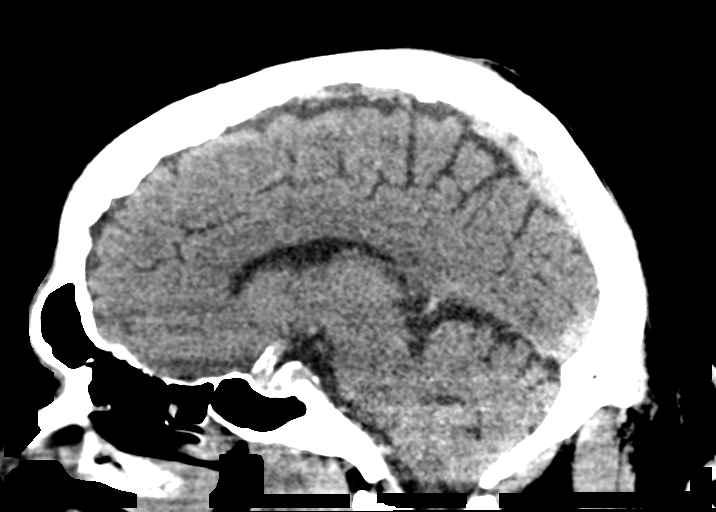
[im 39/58  brain]
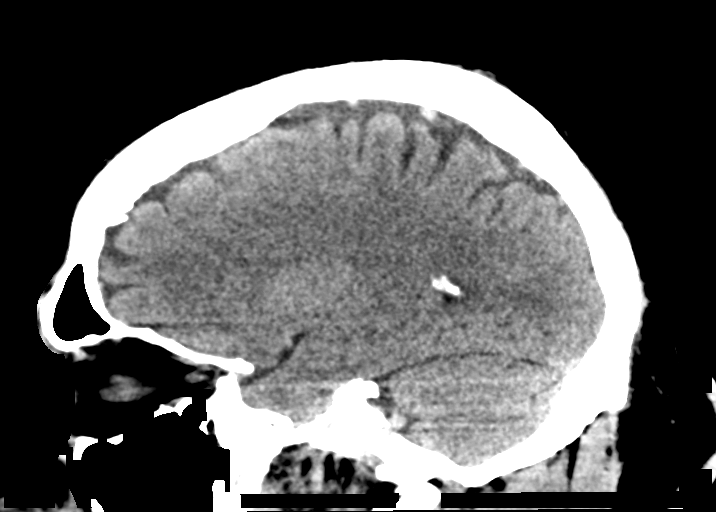

[15 of 47 positions shown; findings below may reference images not displayed]

FINDINGS: Brain: Ventricular system normal in size and appearance for age.
Physiologic calcifications in the basal ganglia bilaterally as noted
previously. No mass lesion. No midline shift. No acute hemorrhage or
hematoma. No extra-axial fluid collections. No evidence of acute
infarction. No evidence of cerebral edema at this time.

Vascular: Moderate BILATERAL carotid siphon and vertebrobasilar
atherosclerosis. No hyperdense vessel.

Skull: No skull fracture or other focal osseous abnormality
involving the skull.

Sinuses/Orbits: Visualized paranasal sinuses, bilateral mastoid air
cells and bilateral middle ear cavities well-aerated.

Visualized orbits and globes normal in appearance.

Other: None.
IMPRESSION: No acute intracranial abnormality.
